# Patient Record
Sex: Male | Born: 1960 | Race: White | Hispanic: No | Marital: Married | Smoking: Current every day smoker
Health system: Southern US, Community
[De-identification: ages and names within clinical notes are randomized; demographics above are authoritative.]

## PROBLEM LIST (undated history)

## (undated) DIAGNOSIS — M545 Other chronic pain: Secondary | ICD-10-CM

## (undated) DIAGNOSIS — R011 Cardiac murmur, unspecified: Secondary | ICD-10-CM

## (undated) DIAGNOSIS — K219 Gastro-esophageal reflux disease without esophagitis: Secondary | ICD-10-CM

## (undated) DIAGNOSIS — S6990XA Unspecified injury of unspecified wrist, hand and finger(s), initial encounter: Secondary | ICD-10-CM

## (undated) DIAGNOSIS — M5136 Other intervertebral disc degeneration, lumbar region: Secondary | ICD-10-CM

## (undated) DIAGNOSIS — M51369 Other intervertebral disc degeneration, lumbar region without mention of lumbar back pain or lower extremity pain: Secondary | ICD-10-CM

## (undated) DIAGNOSIS — N529 Male erectile dysfunction, unspecified: Secondary | ICD-10-CM

## (undated) DIAGNOSIS — F419 Anxiety disorder, unspecified: Secondary | ICD-10-CM

## (undated) DIAGNOSIS — I4510 Unspecified right bundle-branch block: Secondary | ICD-10-CM

## (undated) DIAGNOSIS — K5730 Diverticulosis of large intestine without perforation or abscess without bleeding: Secondary | ICD-10-CM

## (undated) DIAGNOSIS — G8929 Other chronic pain: Secondary | ICD-10-CM

## (undated) DIAGNOSIS — Z87442 Personal history of urinary calculi: Secondary | ICD-10-CM

## (undated) DIAGNOSIS — I10 Essential (primary) hypertension: Secondary | ICD-10-CM

## (undated) DIAGNOSIS — C61 Malignant neoplasm of prostate: Secondary | ICD-10-CM

## (undated) DIAGNOSIS — Z973 Presence of spectacles and contact lenses: Secondary | ICD-10-CM

## (undated) DIAGNOSIS — N200 Calculus of kidney: Secondary | ICD-10-CM

## (undated) DIAGNOSIS — T7840XA Allergy, unspecified, initial encounter: Secondary | ICD-10-CM

## (undated) DIAGNOSIS — E785 Hyperlipidemia, unspecified: Secondary | ICD-10-CM

## (undated) HISTORY — DX: Essential (primary) hypertension: I10

## (undated) HISTORY — DX: Allergy, unspecified, initial encounter: T78.40XA

## (undated) HISTORY — DX: Gastro-esophageal reflux disease without esophagitis: K21.9

## (undated) HISTORY — DX: Cardiac murmur, unspecified: R01.1

## (undated) HISTORY — PX: POLYPECTOMY: SHX149

## (undated) HISTORY — DX: Calculus of kidney: N20.0

## (undated) HISTORY — PX: COLONOSCOPY: SHX174

## (undated) HISTORY — DX: Male erectile dysfunction, unspecified: N52.9

## (undated) HISTORY — DX: Unspecified injury of unspecified wrist, hand and finger(s), initial encounter: S69.90XA

## (undated) HISTORY — PX: UPPER GASTROINTESTINAL ENDOSCOPY: SHX188

## (undated) HISTORY — DX: Anxiety disorder, unspecified: F41.9

---

## 2005-06-29 ENCOUNTER — Ambulatory Visit: Payer: Self-pay | Admitting: Internal Medicine

## 2005-08-11 ENCOUNTER — Ambulatory Visit: Payer: Self-pay | Admitting: Internal Medicine

## 2005-10-23 ENCOUNTER — Ambulatory Visit: Payer: Self-pay | Admitting: Internal Medicine

## 2006-09-14 ENCOUNTER — Ambulatory Visit: Payer: Self-pay | Admitting: Internal Medicine

## 2007-04-28 ENCOUNTER — Ambulatory Visit: Payer: Self-pay | Admitting: Internal Medicine

## 2007-04-28 LAB — CONVERTED CEMR LAB
Albumin: 4.5 g/dL (ref 3.5–5.2)
Basophils Absolute: 0.1 10*3/uL (ref 0.0–0.1)
Basophils Relative: 0.9 % (ref 0.0–1.0)
Bilirubin Urine: NEGATIVE
Chloride: 103 meq/L (ref 96–112)
Cholesterol: 240 mg/dL (ref 0–200)
Creatinine, Ser: 0.6 mg/dL (ref 0.4–1.5)
Direct LDL: 131.4 mg/dL
Eosinophils Absolute: 0.3 10*3/uL (ref 0.0–0.6)
Eosinophils Relative: 3.7 % (ref 0.0–5.0)
GFR calc Af Amer: 187 mL/min
GFR calc non Af Amer: 155 mL/min
HCT: 44.1 % (ref 39.0–52.0)
HDL: 31 mg/dL — ABNORMAL LOW (ref >39.0)
Hemoglobin, Urine: NEGATIVE
Ketones, ur: NEGATIVE mg/dL
Monocytes Relative: 6 % (ref 3.0–11.0)
Neutrophils Relative %: 56.1 % (ref 43.0–77.0)
PSA: 1.76 ng/mL (ref 0.10–4.00)
Platelets: 220 10*3/uL (ref 150–400)
RBC: 4.66 M/uL (ref 4.22–5.81)
RDW: 12 % (ref 11.5–14.6)
Sodium: 139 meq/L (ref 135–145)
Specific Gravity, Urine: >=1.03 (ref 1.000–1.03)
TSH: 1.57 microintl units/mL (ref 0.35–5.50)
Total Bilirubin: 0.7 mg/dL (ref 0.3–1.2)
Total CHOL/HDL Ratio: 7.7
Triglycerides: 402 mg/dL (ref 0–149)
VLDL: 80 mg/dL — ABNORMAL HIGH (ref 0–40)
pH: 6 (ref 5.0–8.0)

## 2007-06-30 ENCOUNTER — Ambulatory Visit: Payer: Self-pay | Admitting: Internal Medicine

## 2007-06-30 LAB — CONVERTED CEMR LAB
Alkaline Phosphatase: 79 units/L (ref 39–117)
Direct LDL: 92 mg/dL
HDL: 31.9 mg/dL — ABNORMAL LOW (ref >39.0)
Total Bilirubin: 1 mg/dL (ref 0.3–1.2)
Total CHOL/HDL Ratio: 5.3
VLDL: 52 mg/dL — ABNORMAL HIGH (ref 0–40)

## 2008-01-16 ENCOUNTER — Ambulatory Visit: Payer: Self-pay | Admitting: Internal Medicine

## 2008-01-16 DIAGNOSIS — J309 Allergic rhinitis, unspecified: Secondary | ICD-10-CM | POA: Insufficient documentation

## 2008-01-16 DIAGNOSIS — R079 Chest pain, unspecified: Secondary | ICD-10-CM | POA: Insufficient documentation

## 2008-01-16 DIAGNOSIS — M545 Low back pain, unspecified: Secondary | ICD-10-CM | POA: Insufficient documentation

## 2008-01-16 DIAGNOSIS — E785 Hyperlipidemia, unspecified: Secondary | ICD-10-CM | POA: Insufficient documentation

## 2008-01-16 DIAGNOSIS — Z720 Tobacco use: Secondary | ICD-10-CM | POA: Insufficient documentation

## 2008-01-16 DIAGNOSIS — F411 Generalized anxiety disorder: Secondary | ICD-10-CM | POA: Insufficient documentation

## 2008-01-16 DIAGNOSIS — K219 Gastro-esophageal reflux disease without esophagitis: Secondary | ICD-10-CM | POA: Insufficient documentation

## 2008-01-16 DIAGNOSIS — F528 Other sexual dysfunction not due to a substance or known physiological condition: Secondary | ICD-10-CM | POA: Insufficient documentation

## 2008-01-16 DIAGNOSIS — F17200 Nicotine dependence, unspecified, uncomplicated: Secondary | ICD-10-CM

## 2008-05-01 ENCOUNTER — Telehealth (INDEPENDENT_AMBULATORY_CARE_PROVIDER_SITE_OTHER): Payer: Self-pay | Admitting: *Deleted

## 2008-05-24 ENCOUNTER — Ambulatory Visit: Payer: Self-pay | Admitting: Internal Medicine

## 2008-05-29 LAB — CONVERTED CEMR LAB
ALT: 50 units/L (ref 0–53)
Albumin: 4.1 g/dL (ref 3.5–5.2)
BUN: 11 mg/dL (ref 6–23)
Bilirubin Urine: NEGATIVE
Chloride: 102 meq/L (ref 96–112)
Cholesterol: 210 mg/dL (ref 0–200)
Glucose, Bld: 125 mg/dL — ABNORMAL HIGH (ref 70–99)
HCT: 41.8 % (ref 39.0–52.0)
HDL: 29.7 mg/dL — ABNORMAL LOW (ref >39.0)
Ketones, ur: NEGATIVE mg/dL
Leukocytes, UA: NEGATIVE
Lymphocytes Relative: 37.2 % (ref 12.0–46.0)
MCHC: 35.3 g/dL (ref 30.0–36.0)
MCV: 95.1 fL (ref 78.0–100.0)
Neutrophils Relative %: 51.6 % (ref 43.0–77.0)
Nitrite: NEGATIVE
PSA: 1.83 ng/mL (ref 0.10–4.00)
Potassium: 3.9 meq/L (ref 3.5–5.1)
TSH: 0.94 microintl units/mL (ref 0.35–5.50)
Total CHOL/HDL Ratio: 7.1
Total Protein, Urine: NEGATIVE mg/dL
Total Protein: 6.7 g/dL (ref 6.0–8.3)
Triglycerides: 281 mg/dL (ref 0–149)
pH: 5.5 (ref 5.0–8.0)

## 2008-05-30 ENCOUNTER — Telehealth (INDEPENDENT_AMBULATORY_CARE_PROVIDER_SITE_OTHER): Payer: Self-pay | Admitting: *Deleted

## 2009-03-11 ENCOUNTER — Ambulatory Visit: Payer: Self-pay | Admitting: Internal Medicine

## 2009-03-11 DIAGNOSIS — R1013 Epigastric pain: Secondary | ICD-10-CM | POA: Insufficient documentation

## 2009-03-11 DIAGNOSIS — E739 Lactose intolerance, unspecified: Secondary | ICD-10-CM | POA: Insufficient documentation

## 2009-03-13 LAB — CONVERTED CEMR LAB
Basophils Absolute: 0.1 10*3/uL (ref 0.0–0.1)
Calcium: 9 mg/dL (ref 8.4–10.5)
Chloride: 104 meq/L (ref 96–112)
Cholesterol: 198 mg/dL (ref 0–200)
Direct LDL: 121.6 mg/dL
Eosinophils Absolute: 0.5 10*3/uL (ref 0.0–0.7)
Eosinophils Relative: 6.3 % — ABNORMAL HIGH (ref 0.0–5.0)
HCT: 43.4 % (ref 39.0–52.0)
HDL: 31.5 mg/dL — ABNORMAL LOW (ref >39.00)
Hemoglobin: 15.1 g/dL (ref 13.0–17.0)
Hgb A1c MFr Bld: 5.4 % (ref 4.6–6.5)
MCHC: 34.9 g/dL (ref 30.0–36.0)
Monocytes Absolute: 0.4 10*3/uL (ref 0.1–1.0)
Platelets: 186 10*3/uL (ref 150.0–400.0)
Potassium: 4.3 meq/L (ref 3.5–5.1)
Sodium: 138 meq/L (ref 135–145)
Total Bilirubin: 0.4 mg/dL (ref 0.3–1.2)
Total CHOL/HDL Ratio: 6
Triglycerides: 321 mg/dL — ABNORMAL HIGH (ref 0.0–149.0)

## 2009-04-24 ENCOUNTER — Ambulatory Visit: Payer: Self-pay | Admitting: Internal Medicine

## 2009-04-24 LAB — CONVERTED CEMR LAB
ALT: 47 units/L (ref 0–53)
AST: 36 units/L (ref 0–37)
Albumin: 4.2 g/dL (ref 3.5–5.2)
Alkaline Phosphatase: 64 units/L (ref 39–117)
Bilirubin, Direct: 0.2 mg/dL (ref 0.0–0.3)
Cholesterol: 193 mg/dL (ref 0–200)
Total CHOL/HDL Ratio: 6
Triglycerides: 372 mg/dL — ABNORMAL HIGH (ref 0.0–149.0)

## 2009-05-06 ENCOUNTER — Telehealth: Payer: Self-pay | Admitting: Internal Medicine

## 2009-05-28 ENCOUNTER — Telehealth (INDEPENDENT_AMBULATORY_CARE_PROVIDER_SITE_OTHER): Payer: Self-pay | Admitting: *Deleted

## 2009-10-29 ENCOUNTER — Telehealth: Payer: Self-pay | Admitting: Internal Medicine

## 2009-11-05 ENCOUNTER — Telehealth: Payer: Self-pay | Admitting: Internal Medicine

## 2010-01-16 ENCOUNTER — Ambulatory Visit: Payer: Self-pay | Admitting: Internal Medicine

## 2010-01-16 ENCOUNTER — Encounter: Payer: Self-pay | Admitting: Internal Medicine

## 2010-01-16 DIAGNOSIS — R0602 Shortness of breath: Secondary | ICD-10-CM | POA: Insufficient documentation

## 2010-01-16 LAB — CONVERTED CEMR LAB
AST: 32 units/L (ref 0–37)
Basophils Absolute: 0.1 10*3/uL (ref 0.0–0.1)
Basophils Relative: 1.3 % (ref 0.0–3.0)
Bilirubin Urine: NEGATIVE
CO2: 30 meq/L (ref 19–32)
Creatinine, Ser: 0.6 mg/dL (ref 0.4–1.5)
Eosinophils Relative: 5.7 % — ABNORMAL HIGH (ref 0.0–5.0)
GFR calc non Af Amer: 152.58 mL/min (ref >60)
Glucose, Bld: 88 mg/dL (ref 70–99)
HDL: 43.8 mg/dL (ref >39.00)
Hemoglobin, Urine: NEGATIVE
Hemoglobin: 15.1 g/dL (ref 13.0–17.0)
LDL Cholesterol: 96 mg/dL (ref 0–99)
Leukocytes, UA: NEGATIVE
Lymphocytes Relative: 23.7 % (ref 12.0–46.0)
MCHC: 33.5 g/dL (ref 30.0–36.0)
MCV: 97.7 fL (ref 78.0–100.0)
Monocytes Relative: 5.6 % (ref 3.0–12.0)
Neutro Abs: 5.4 10*3/uL (ref 1.4–7.7)
Nitrite: NEGATIVE
RDW: 12.1 % (ref 11.5–14.6)
TSH: 1.17 microintl units/mL (ref 0.35–5.50)
Total Bilirubin: 0.4 mg/dL (ref 0.3–1.2)
Total CHOL/HDL Ratio: 4
Urobilinogen, UA: 1 (ref 0.0–1.0)
VLDL: 31.2 mg/dL (ref 0.0–40.0)

## 2010-01-23 ENCOUNTER — Ambulatory Visit: Payer: Self-pay | Admitting: Internal Medicine

## 2010-01-29 ENCOUNTER — Encounter: Payer: Self-pay | Admitting: Internal Medicine

## 2010-06-24 ENCOUNTER — Telehealth: Payer: Self-pay | Admitting: Internal Medicine

## 2010-06-26 ENCOUNTER — Encounter (INDEPENDENT_AMBULATORY_CARE_PROVIDER_SITE_OTHER): Payer: Self-pay | Admitting: *Deleted

## 2010-06-26 ENCOUNTER — Ambulatory Visit: Payer: Self-pay | Admitting: Internal Medicine

## 2010-06-26 DIAGNOSIS — R634 Abnormal weight loss: Secondary | ICD-10-CM | POA: Insufficient documentation

## 2010-07-03 ENCOUNTER — Telehealth: Payer: Self-pay | Admitting: Internal Medicine

## 2010-08-05 ENCOUNTER — Ambulatory Visit: Payer: Self-pay | Admitting: Internal Medicine

## 2010-08-05 HISTORY — PX: ESOPHAGOGASTRODUODENOSCOPY: SHX1529

## 2010-11-12 ENCOUNTER — Telehealth: Payer: Self-pay | Admitting: Internal Medicine

## 2010-11-19 ENCOUNTER — Ambulatory Visit: Admit: 2010-11-19 | Payer: Self-pay | Admitting: Internal Medicine

## 2010-12-12 ENCOUNTER — Ambulatory Visit: Admit: 2010-12-12 | Payer: Self-pay | Admitting: Internal Medicine

## 2010-12-24 ENCOUNTER — Ambulatory Visit: Admit: 2010-12-24 | Payer: Self-pay | Admitting: Internal Medicine

## 2010-12-24 ENCOUNTER — Other Ambulatory Visit: Payer: Self-pay

## 2010-12-24 NOTE — Letter (Signed)
Summary: EGD Instructions  [facility]  [address] [place], [place] [address]   Phone: [phone]  Fax: [phone]       Antone Payano    [birth year]    MRN: [id number]       Procedure Day Dorna Bloom: Jake Shark, 08/05/10     Arrival Time: 12:30 PM     Procedure Time: 1:30 PM     Location of Procedure:                    _X_ [facility] (4th Floor)  PREPARATION FOR ENDOSCOPY   On TUESDAY, 08/05/10,  THE DAY OF THE PROCEDURE:  1.   No solid foods, milk or milk products are allowed after midnight the night before your procedure.  2.   Do not drink anything colored red or purple.  Avoid juices with pulp.  No orange juice.  3.  You may drink clear liquids until 11:30AM, which is 2 hours before your procedure.                                                                                                CLEAR LIQUIDS INCLUDE: Water Jello Ice Popsicles Tea (sugar ok, no milk/cream) Powdered fruit flavored drinks Coffee (sugar ok, no milk/cream) Gatorade Juice: apple, white grape, white cranberry  Lemonade Clear bullion, consomm, broth Carbonated beverages (any kind) Strained chicken noodle soup Hard Candy   MEDICATION INSTRUCTIONS  Unless otherwise instructed, you should take regular prescription medications with a small sip of water as early as possible the morning of your procedure.                  OTHER INSTRUCTIONS  You will need a responsible adult at least 50 years of age to accompany you and drive you home.   This person must remain in the waiting room during your procedure.  Wear loose fitting clothing that is easily removed.  Leave jewelry and other valuables at home.  However, you may wish to bring a book to read or an iPod/MP3 player to listen to music as you wait for your procedure to start.  Remove all body piercing jewelry and leave at home.  Total time from sign-in until discharge is approximately 2-3 hours.  You  should go home directly after your procedure and rest.  You can resume normal activities the day after your procedure.  The day of your procedure you should not:   Drive   Make legal decisions   Operate machinery   Drink alcohol   Return to work  You will receive specific instructions about eating, activities and medications before you leave.    The above instructions have been reviewed and explained to me by   _______________________    I fully understand and can verbalize these instructions _____________________________ Date _________

## 2010-12-24 NOTE — Progress Notes (Signed)
Summary: triage  Phone Note Call from Patient Call back at Home Phone [phone]   Caller: Patient Call For: Dr. Leone Payor Reason for Call: Talk to Nurse Details for Reason: triage Summary of Call: new pt appt scheduled 9/26 - having problems and would like to be seen sooner...please call. Initial call taken by: Schuyler Amor,  June 24, 2010 3:08 PM  Follow-up for Phone Call        rescheduled for 06/26/10 Follow-up by: Darcey Nora RN, CGRN,  June 24, 2010 3:19 PM

## 2010-12-24 NOTE — Progress Notes (Signed)
Summary: REQ FOR RX  Phone Note Call from Patient   Summary of Call: Patient is requesting rx for pravachol. He currently is taking 80mg  once daily. He would like rx for 40mg  2 daily to go to [facility].  Initial call taken by: Lamar Sprinkles, CMA,  July 03, 2010 3:24 PM  Follow-up for Phone Call        ok  for rx - to [facility] to handle  due for CPX with labs feb 2012 please Follow-up by: Corwin Levins MD,  July 03, 2010 3:39 PM    New/Updated Medications: PRAVACHOL 40 MG TABS (PRAVASTATIN SODIUM) 2 by mouth once daily Prescriptions: PRAVACHOL 40 MG TABS (PRAVASTATIN SODIUM) 2 by mouth once daily  #60 x 5   Entered by:   Scharlene Gloss CMA (AAMA)   Authorized by:   Corwin Levins MD   Signed by:   Scharlene Gloss CMA (AAMA) on 07/03/2010   Method used:   Faxed to ...       [facility] [address] (retail)       [address]       [place], [place]  [address]       Ph: [phone]       Fax: [phone]   RxID:   [id number]   Appended Document: REQ FOR RX Called pt informed prescription sent in and patient did schedule CPX December 31, 2010.

## 2010-12-24 NOTE — Miscellaneous (Signed)
Summary: Orders Update  Clinical Lists Changes  Orders: Added new Referral order of Misc. Referral (Misc. Ref) - Signed 

## 2010-12-24 NOTE — Assessment & Plan Note (Signed)
Summary: abd pain...as.   History of Present Illness Visit Type: consult  Primary GI MD: Stan Head MD [facility] Primary Provider: Oliver Barre, MD  Requesting Provider: Oliver Barre, MD  Chief Complaint: Loss of appetite  History of Present Illness:   50 yo wm with 6 months of epigastric burning. Starting to have anorexia. Some sharp stinging and very transient pain that occurs on top of this burning. Does not disturb his sleep. He takes Prilosec daily which controls heartburn mostly.  5 # unintentional weight decrease since May. He tried Maalox recently but developed diarrhea. Denies early satiety but limits eating due to fear pain will worsen. Looking back he has had similar sxs since at least 02/2009, though the heartburn sxs seem improved with PPI.   GI Review of Systems    Reports abdominal pain and  loss of appetite.     Location of  Abdominal pain: upper abdomen.    Denies acid reflux, belching, bloating, chest pain, dysphagia with liquids, dysphagia with solids, heartburn, nausea, vomiting, vomiting blood, weight loss, and  weight gain.        Denies anal fissure, black tarry stools, change in bowel habit, constipation, diarrhea, diverticulosis, fecal incontinence, heme positive stool, hemorrhoids, irritable bowel syndrome, jaundice, light color stool, liver problems, rectal bleeding, and  rectal pain.    Current Medications (verified): 1)  Tramadol Hcl 50 Mg Tabs (Tramadol Hcl) .... 1/2 By Mouth Two Times A Day As Needed 2)  Ecotrin Low Strength 81 Mg  Tbec (Aspirin) .Marland Kitchen.. 1po Qd 3)  Omeprazole 20 Mg Cpdr (Omeprazole) .Marland Kitchen.. 1 By Mouth Once Daily  Allergies (verified): No Known Drug Allergies  Past History:  Past Medical History: Reviewed history from 03/11/2009 and no changes required. Hyperlipidemia Low back pain - chronic lumbar disc disease Anxiety E.D. Allergic rhinitis GERD glucose intolerance  Past Surgical History: Reviewed history from 01/16/2008 and no changes  required. Tonsillectomy  Family History: father with prostate cancer No FH of Colon Cancer: Family History of Heart Disease: Both sides of the family   Social History: Self Employed printing and sells flooring Married no biological children Patient is a former smoker.  Alcohol Use - yes: 3-6 daily  Illicit Drug Use - no Smoking Status:  quit Drug Use:  no  Review of Systems       The patient complains of back pain.         All other ROS negative except as per HPI.   Vital Signs:  Patient profile:   50 year old male Height:      71 inches Weight:      157 pounds BMI:     21.98 BSA:     1.90 Pulse rate:   88 / minute Pulse rhythm:   regular BP sitting:   122 / 74  (left arm) Cuff size:   regular  Vitals Entered By: Ok Anis CMA (June 26, 2010 3:42 PM)  Physical Exam  General:  Well developed, well nourished, no acute distress. Eyes:  no icterus. Mouth:  No deformity or lesions, dentition normal. Neck:  Supple; no masses or thyromegaly. Lungs:  Clear throughout to auscultation. Heart:  Regular rate and rhythm; no murmurs, rubs,  or bruits. Abdomen:  Soft, nontender and nondistended. No masses, hepatosplenomegaly or hernias noted. Normal bowel sounds. Extremities:  No clubbing, cyanosis, edema or deformities noted. Neurologic:  Alert and  oriented x4;  grossly normal neurologically. Cervical Nodes:  No significant cervical or supraclavicular adenopathy.  Psych:  Alert and cooperative. Normal mood and affect.   Impression & Recommendations:  Problem # 1:  ABDOMINAL PAIN, EPIGASTRIC (ICD-789.06) Assessment Comment Only New to me but noted since 02/2009 chronic, mild weight loss at 157# and was in low 160's then 170 about 15 mos ago cause unclear at this time, part of symptom complex (pyrosis, GERD sxs) much better with PPI (omeprazole) ? gastritis, functional, neoplasm possible but much less likely given chronicity but does have some weight loss  will  change omeprazole to Dexilant 60 mg daily (samples) to see if more effective also  Risks, benefits,and indications of endoscopic procedure(s) were reviewed with the patient and all questions answered. dexilant samples  Orders: EGD (EGD)  Problem # 2:  WEIGHT LOSS (ICD-783.21) Assessment: New mild 5# unintentional Orders: EGD (EGD)  Problem # 3:  SCREENING, COLON CANCER (ICD-V76.51) Assessment: Comment Only will place a colonoscopy recall for 08/2011 (when he is 50)  Patient Instructions: 1)  Dexilant samples given.  Call for a prescription if this helps. 2)  We will see you at your procedure on 08/05/10. 3)  [facility] Patient Information Guide given to patient.  4)  Upper Endoscopy brochure given.  5)  Copy sent to : Oliver Barre, MD 6)  The medication list was reviewed and reconciled.  All changed / newly prescribed medications were explained.  A complete medication list was provided to the patient / caregiver.

## 2010-12-24 NOTE — Miscellaneous (Signed)
Summary: Orders Update pft charges  Clinical Lists Changes  Orders: Added new Service order of Carbon Monoxide diffusing w/capacity (94720) - Signed Added new Service order of Lung Volumes (94240) - Signed Added new Service order of Spirometry (Pre & Post) (94060) - Signed 

## 2010-12-24 NOTE — Procedures (Signed)
Summary: Upper Endoscopy  Patient: Richard Santos Note: All result statuses are Final unless otherwise noted.  Tests: (1) Upper Endoscopy (EGD)   EGD Upper Endoscopy       DONE     [facility]     [address]     [place], [place]  [address]           ENDOSCOPY PROCEDURE REPORT           PATIENT:  Richard Santos  MR#:  [id number]     BIRTHDATE:  06-27-1961, 48 yrs. old  GENDER:  male           ENDOSCOPIST:  Iva Boop, MD, [facility]     Referred by:  Oliver Barre, M.D.           PROCEDURE DATE:  08/05/2010     PROCEDURE:  EGD with biopsy     ASA CLASS:  Class I     INDICATIONS:  epigastric pain -  burning epigastric pain x several     months or more     PPI controls heartburn but not the pain     On Prilosec, samples of Dexilant caused diarrhea           MEDICATIONS:   Fentanyl 50 mcg, Versed 5 mg IV     TOPICAL ANESTHETIC:  Exactacain Spray           DESCRIPTION OF PROCEDURE:   After the risks benefits and     alternatives of the procedure were thoroughly explained, informed     consent was obtained.  The LB [id number]-[id number] endoscope was     introduced through the mouth and advanced to the second portion of     the duodenum, without limitations.  The instrument was slowly     withdrawn as the mucosa was fully examined.     <<PROCEDUREIMAGES>>           There were columnar-type mucosal changes in the distal esophagus,     that could represent Barrett's esophagus in the distal esophagus.     Three 0.5-1cm tongues 40-41 cm. Nothing suspicious seen on white     light or NBI. Multiple biopsies were obtained and sent to     pathology.  Mild gastritis was found in the antrum. It was     erythematous and mottled. Multiple biopsies were obtained and sent     to pathology.  Otherwise the examination was normal.     Retroflexed views revealed no abnormalities.    The scope was then     withdrawn from the patient and the procedure completed.           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           REPEAT EXAM:  as needed/pending biopsies           Iva Boop, MD, [facility]           CC:  Corwin Levins, MD     The Patient           n.     eSIGNED:  Iva Boop at 08/05/2010 01:54 PM           Domingo Pulse, [id number]  Note: An exclamation mark (!) indicates a result that was not dispersed into the flowsheet. Document Creation Date: 08/05/2010 1:55 PM _______________________________________________________________________  (1) Order result status: Final Collection or observation date-time: 08/05/2010 13:43 Requested date-time:  Receipt date-time:  Reported date-time:  Referring Physician:   Ordering Physician: Stan Head [id number]) Specimen Source:  Source: Launa Grill Order Number: [id number] Lab site:

## 2010-12-24 NOTE — Assessment & Plan Note (Signed)
Summary: PER WIFE END OF WK APPT-STOMACH/BURNING--STC   Vital Signs:  Patient profile:   50 year old male Height:      71 inches Weight:      160.50 pounds BMI:     22.47 O2 Sat:      98 % on Room air Temp:     98.1 degrees F oral Pulse rate:   86 / minute BP sitting:   120 / 80  (left arm) Cuff size:   regular  Vitals Entered ByZella Ball Ewing (January 16, 2010 10:38 AM)  O2 Flow:  Room air  Preventive Care Screening     declines tetanus today  CC: Stomach pain, wellness/RE   CC:  Stomach pain and wellness/RE.  History of Present Illness: here with constant midl to mod burning pain to the upper mid abd and with some radiation to the left upper abd, has flares up to 10 min with significant worsening pain level to mod level several times per day for the last 2 to 3 wks;  only some improved with tramadol and only takes 1/2 two times a day as he is somewhat wary of potential side effect though he has not had side effect so far;  overall pain worse to walking up inclines or hills - worse in the am, but later in the day seems to improve to mild in the afternoon and evening.  No n/v, dysphagia, wt loss, fever, other abd pain, distension, change in bowel habit or blood loss.  Denies nsaid use or ETOH increase.  Overall good complaicne with meds, tolerating well.  Nothing seems to make the pain better.  Has been taking the PPI infrequently recently as reflux has been minor and gettnig by with as needed otc atnacid and diet control measures.    Problems Prior to Update: 1)  Dyspnea  (ICD-786.05) 2)  Hepatotoxicity, Drug-induced, Risk of  (ICD-V58.69) 3)  Abdominal Pain, Epigastric  (ICD-789.06) 4)  Glucose Intolerance  (ICD-271.3) 5)  Preventive Health Care  (ICD-V70.0) 6)  Gerd  (ICD-530.81) 7)  Allergic Rhinitis  (ICD-477.9) 8)  Anxiety  (ICD-300.00) 9)  Low Back Pain  (ICD-724.2) 10)  Erectile Dysfunction  (ICD-302.72) 11)  Smoker  (ICD-305.1) 12)  Hyperlipidemia  (ICD-272.4) 13)   Chest Pain  (ICD-786.50)  Medications Prior to Update: 1)  Tramadol Hcl 50 Mg Tabs (Tramadol Hcl) .Marland Kitchen.. 1po Four Times Per Day As Needed For Pain 2)  Ecotrin Low Strength 81 Mg  Tbec (Aspirin) .Marland Kitchen.. 1po Qd 3)  Cialis 20 Mg  Tabs (Tadalafil) .Marland Kitchen.. 1 By Mouth Once Daily Prn 4)  Lansoprazole 30 Mg Cpdr (Lansoprazole) .Marland Kitchen.. 1 By Mouth Once Daily 5)  Pravachol 80 Mg Tabs (Pravastatin Sodium) .Marland Kitchen.. 1 By Mouth Once Daily  Current Medications (verified): 1)  Tramadol Hcl 50 Mg Tabs (Tramadol Hcl) .... 1/2 By Mouth Two Times A Day As Needed 2)  Ecotrin Low Strength 81 Mg  Tbec (Aspirin) .Marland Kitchen.. 1po Qd 3)  Cialis 20 Mg  Tabs (Tadalafil) .Marland Kitchen.. 1 By Mouth Once Daily Prn 4)  Omeprazole 20 Mg Cpdr (Omeprazole) .... 2po Once Daily  Allergies (verified): No Known Drug Allergies  Past History:  Past Medical History: Last updated: 03/11/2009 Hyperlipidemia Low back pain - chronic lumbar disc disease Anxiety E.D. Allergic rhinitis GERD glucose intolerance  Past Surgical History: Last updated: 01/16/2008 Tonsillectomy  Family History: Last updated: 01/16/2008 father with prostate cancer  Social History: Last updated: 03/11/2009 Current Smoker Alcohol use-yes Married no biological children work -  press operator - currently unemployed  Risk Factors: Smoking Status: current (01/16/2008)  Review of Systems  The patient denies anorexia, fever, weight loss, vision loss, decreased hearing, hoarseness, chest pain, syncope, dyspnea on exertion, peripheral edema, prolonged cough, headaches, hemoptysis, melena, hematochezia, severe indigestion/heartburn, hematuria, suspicious skin lesions, difficulty walking, depression, unusual weight change, abnormal bleeding, enlarged lymph nodes, and angioedema.         all otherwise negative per pt - except for mild sob at rest randomly without exertion, ? anxiety related, no ST, fever, cough, wheezing  Physical Exam  General:  alert and well-developed.     Head:  normocephalic and atraumatic.   Eyes:  vision grossly intact, pupils equal, and pupils round.   Ears:  R ear normal and L ear normal.   Nose:  no external deformity and no nasal discharge.   Mouth:  no gingival abnormalities and pharynx pink and moist.   Neck:  supple and no masses.   Lungs:  normal respiratory effort and normal breath sounds.   Heart:  normal rate and regular rhythm.   Abdomen:  soft and normal bowel sounds.  , mild epigastric tender without guarding , rebound or distension Msk:  no joint tenderness and no joint swelling.   Extremities:  no edema, no erythema  Neurologic:  cranial nerves II-XII intact and strength normal in all extremities.     Impression & Recommendations:  Problem # 1:  Preventive Health Care (ICD-V70.0)  Overall doing well, age appropriate education and counseling updated and referral for appropriate preventive services done unless declined, immunizations up to date or declined, diet counseling done if overweight, urged to quit smoking if smokes , most recent labs reviewed and current ordered if appropriate, ecg reviewed or declined (interpretation per ECG scanned in the EMR if done); information regarding Medicare Prevention requirements given if appropriate   Orders: TLB-TSH (Thyroid Stimulating Hormone) (84443-TSH) TLB-PSA (Prostate Specific Antigen) (84153-PSA) TLB-Lipid Panel (80061-LIPID) TLB-Udip ONLY (81003-UDIP) TLB-Hepatic/Liver Function Pnl (80076-HEPATIC) TLB-CBC Platelet - w/Differential (85025-CBCD) TLB-BMP (Basic Metabolic Panel-BMET) (80048-METABOL)  Problem # 2:  ABDOMINAL PAIN, EPIGASTRIC (ICD-789.06)  to increase the ppi to 2 once daily, stop the statin for now;  refer GI, check lipase; likely needs EGD  Orders: Gastroenterology Referral (GI) TLB-Lipase (83690-LIPASE)  Problem # 3:  DYSPNEA (ICD-786.05)  Orders: T-2 View CXR, Same Day (71020.5TC) unclear etiology, to check labs as above, check cxr,  declines  echo, ecg for now, trying to avoid cost  Complete Medication List: 1)  Tramadol Hcl 50 Mg Tabs (Tramadol hcl) .... 1/2 by mouth two times a day as needed 2)  Ecotrin Low Strength 81 Mg Tbec (Aspirin) .Marland Kitchen.. 1po qd 3)  Cialis 20 Mg Tabs (Tadalafil) .Marland Kitchen.. 1 by mouth once daily prn 4)  Omeprazole 20 Mg Cpdr (Omeprazole) .... 2po once daily  Patient Instructions: 1)  increase the omeprazole 20 mg to 2 pills per day 2)  stop the pravachol for now 3)  Please go to the Lab in the basement for your blood and/or urine tests today 4)  Please go to Radiology in the basement level for your X-Ray today  5)  Continue all previous medications as before this visit 6)  You will be contacted about the referral(s) to: Gastroenterology 7)  Please schedule a follow-up appointment in 1 year or sooner if needed Prescriptions: OMEPRAZOLE 20 MG CPDR (OMEPRAZOLE) 2po once daily  #180 x 3   Entered and Authorized by:   Corwin Levins MD  Signed by:   Corwin Levins MD on 01/16/2010   Method used:   Electronically to        [facility] [address] (retail)       [address]       [place], [place]  [address]       Ph: [phone]       Fax: [phone]   RxID:   [id number]

## 2010-12-25 NOTE — Progress Notes (Signed)
Summary: Triage  Phone Note Call from Patient Call back at Home Phone [phone]   Caller: Patient Call For: Dr. Leone Payor Reason for Call: Talk to Nurse Summary of Call: abd burning, cannot eat regularly due to the burning Initial call taken by: Karna Christmas,  November 12, 2010 4:06 PM  Follow-up for Phone Call        Patient  c/o upper "stomach burning".  Worse if he eats too much.  He currently is taking pantoprazole once daily, but this is not helping.  I have asked him to increase his pantoprazole to two times a day until Dr. Leone Payor returns next week.  Dr Leone Payor per your pathology report in Sept says if continued pain will change meds.  Dr Leone Payor he is aware you are out of the office until Monday.  Please advise Follow-up by: Darcey Nora RN, CGRN,  November 12, 2010 4:57 PM  Additional Follow-up for Phone Call Additional follow up Details #1::        1) How much does he Richard Santos now? 2) is he still drinking 3-6 alcoholic beverages/day? - this is too much. 3) go ahead and set up an REV also (in January sometime)  I will make recommendations after we find this out Additional Follow-up by: Iva Boop MD, [facility],  November 15, 2010 7:25 AM    Additional Follow-up for Phone Call Additional follow up Details #2::    Left message for patient to call back Darcey Nora RN, [facility]  November 18, 2010 10:28 AM  Lmom for patient to call back. Graciella Freer RN  November 19, 2010 10:20 AM    Patient reports the Pantoprazole Bid is still not helping. He reports he may have lost 5 lbs and his last weight was 153. He reports consuming 3-6 alcoholic beverages daily. Patient reports his appetite has really decreased and his pain is at the top of his stomach at his ribs " like a severe sunburn inside".  Per Dr Leone Payor, scheduled patient for a REV on 12/12/10 @  2:30pm. Graciella Freer RN  November 19, 2010 11:29 AM   Additional Follow-up for Phone Call Additional follow up Details #3::  Details for Additional Follow-up Action Taken: ok he needs to stop alcohol completely also have hinm do a CBC, CMET and amylase/lipase re: abdominal pain Additional Follow-up by: Iva Boop MD, [facility],  November 19, 2010 11:46 AM  Lmom for patient to call back. Ordered labs for patient per Dr Leone Payor. Graciella Freer RN  November 19, 2010 2:08 PM   Notified patient of his lab appointment and that he needs to stop drinking alcohol completely per Dr Leone Payor. Patient stated understanding. Graciella Freer RN  November 19, 2010 3:41 PM

## 2010-12-29 ENCOUNTER — Encounter (INDEPENDENT_AMBULATORY_CARE_PROVIDER_SITE_OTHER): Payer: Self-pay | Admitting: *Deleted

## 2010-12-29 ENCOUNTER — Other Ambulatory Visit: Payer: Self-pay | Admitting: Internal Medicine

## 2010-12-29 ENCOUNTER — Other Ambulatory Visit: Payer: BC Managed Care – PPO

## 2010-12-29 DIAGNOSIS — Z1289 Encounter for screening for malignant neoplasm of other sites: Secondary | ICD-10-CM

## 2010-12-29 DIAGNOSIS — Z0000 Encounter for general adult medical examination without abnormal findings: Secondary | ICD-10-CM

## 2010-12-29 DIAGNOSIS — E785 Hyperlipidemia, unspecified: Secondary | ICD-10-CM

## 2010-12-29 LAB — URINALYSIS
Leukocytes, UA: NEGATIVE
Nitrite: NEGATIVE
Specific Gravity, Urine: 1.015 (ref 1.000–1.030)
Urine Glucose: NEGATIVE
Urobilinogen, UA: 0.2 (ref 0.0–1.0)

## 2010-12-29 LAB — LDL CHOLESTEROL, DIRECT: Direct LDL: 156.3 mg/dL

## 2010-12-29 LAB — LIPID PANEL
Total CHOL/HDL Ratio: 5
VLDL: 29 mg/dL (ref 0.0–40.0)

## 2010-12-29 LAB — BASIC METABOLIC PANEL
BUN: 10 mg/dL (ref 6–23)
Chloride: 100 mEq/L (ref 96–112)
Potassium: 4.1 mEq/L (ref 3.5–5.1)

## 2010-12-29 LAB — CBC WITH DIFFERENTIAL/PLATELET
Basophils Relative: 0.5 % (ref 0.0–3.0)
Eosinophils Absolute: 0.4 10*3/uL (ref 0.0–0.7)
Eosinophils Relative: 4.8 % (ref 0.0–5.0)
Lymphocytes Relative: 31.4 % (ref 12.0–46.0)
Neutrophils Relative %: 58.5 % (ref 43.0–77.0)
RBC: 4.55 Mil/uL (ref 4.22–5.81)
WBC: 8.3 10*3/uL (ref 4.5–10.5)

## 2010-12-29 LAB — TSH: TSH: 0.97 u[IU]/mL (ref 0.35–5.50)

## 2010-12-29 LAB — HEPATIC FUNCTION PANEL
ALT: 47 U/L (ref 0–53)
AST: 39 U/L — ABNORMAL HIGH (ref 0–37)
Total Bilirubin: 0.6 mg/dL (ref 0.3–1.2)

## 2010-12-29 LAB — PSA: PSA: 3.97 ng/mL (ref 0.10–4.00)

## 2010-12-31 ENCOUNTER — Encounter: Payer: Self-pay | Admitting: Internal Medicine

## 2010-12-31 ENCOUNTER — Encounter (INDEPENDENT_AMBULATORY_CARE_PROVIDER_SITE_OTHER): Payer: BC Managed Care – PPO | Admitting: Internal Medicine

## 2010-12-31 DIAGNOSIS — K2990 Gastroduodenitis, unspecified, without bleeding: Secondary | ICD-10-CM | POA: Insufficient documentation

## 2010-12-31 DIAGNOSIS — F1010 Alcohol abuse, uncomplicated: Secondary | ICD-10-CM | POA: Insufficient documentation

## 2010-12-31 DIAGNOSIS — K2970 Gastritis, unspecified, without bleeding: Secondary | ICD-10-CM | POA: Insufficient documentation

## 2010-12-31 DIAGNOSIS — Z23 Encounter for immunization: Secondary | ICD-10-CM

## 2010-12-31 DIAGNOSIS — Z0000 Encounter for general adult medical examination without abnormal findings: Secondary | ICD-10-CM

## 2011-01-08 NOTE — Assessment & Plan Note (Signed)
Summary: CPX/BCBS/#/CD   Vital Signs:  Patient profile:   50 year old male Height:      72 inches Weight:      162.38 pounds BMI:     22.10 O2 Sat:      97 % on Room air Temp:     98.3 degrees F oral Pulse rate:   90 / minute BP sitting:   112 / 72  (left arm) Cuff size:   regular  Vitals Entered By: Zella Ball Ewing CMA Duncan Dull) (December 31, 2010 1:07 PM)  O2 Flow:  Room air  CC: Adult Physical/Re   Primary Care Provider:  Oliver Barre, MD   CC:  Adult Physical/Re.  History of Present Illness: here for wellness, still drinking over 3 beers per day despite being asked not to per GI, still with gastritis type symptoms despite Overall good compliance with meds, and good tolerability. Pt denies CP, worsening sob, doe, wheezing, orthopnea, pnd, worsening LE edema, palps, dizziness or syncope  Pt denies new neuro symptoms such as headache, facial or extremity weakness  Pt denies polydipsia, polyuria   Overall good compliance with meds, trying to follow low chol  diet, wt stable, little excercise however.  ran out of statin prior labs this time, but normally takes well.  Overall good compliance with meds, and good tolerability.  Denies worsening depressive symptoms, suicidal ideation, or panic.   No fever, wt loss, night sweats, loss of appetite or other constitutional symptoms  Pt states good ability with ADL's, low fall risk, home safety reviewed and adequate, no significant change in hearing or vision, trying to follow lower chol diet, and occasionally active only with regular excercise.   Problems Prior to Update: 1)  Gastritis  (ICD-535.50) 2)  Ethanol Abuse  (ICD-305.00) 3)  Preventive Health Care  (ICD-V70.0) 4)  Weight Loss  (ICD-783.21) 5)  Dyspnea  (ICD-786.05) 6)  Hepatotoxicity, Drug-induced, Risk of  (ICD-V58.69) 7)  Abdominal Pain, Epigastric  (ICD-789.06) 8)  Glucose Intolerance  (ICD-271.3) 9)  Gerd  (ICD-530.81) 10)  Allergic Rhinitis  (ICD-477.9) 11)  Anxiety   (ICD-300.00) 12)  Low Back Pain  (ICD-724.2) 13)  Erectile Dysfunction  (ICD-302.72) 14)  Smoker  (ICD-305.1) 15)  Hyperlipidemia  (ICD-272.4) 16)  Chest Pain  (ICD-786.50)  Medications Prior to Update: 1)  Tramadol Hcl 50 Mg Tabs (Tramadol Hcl) .... 1/2 By Mouth Two Times A Day As Needed 2)  Ecotrin Low Strength 81 Mg  Tbec (Aspirin) .Marland Kitchen.. 1po Qd 3)  Pravachol 40 Mg Tabs (Pravastatin Sodium) .... 2 By Mouth Once Daily 4)  Pantoprazole Sodium 40 Mg Tbec (Pantoprazole Sodium) .Marland Kitchen.. 1 By Mouth Once Daily 30 Minutes Before A Meal  Current Medications (verified): 1)  Tramadol Hcl 50 Mg Tabs (Tramadol Hcl) .... 1/2 By Mouth Two Times A Day As Needed 2)  Ecotrin Low Strength 81 Mg  Tbec (Aspirin) .Marland Kitchen.. 1po Qd 3)  Pravachol 40 Mg Tabs (Pravastatin Sodium) .... 2 By Mouth Once Daily 4)  Pantoprazole Sodium 40 Mg Tbec (Pantoprazole Sodium) .Marland Kitchen.. 1 By Mouth Once Daily 30 Minutes Before A Meal 5)  Dexilant 60 Mg Cpdr (Dexlansoprazole) .Marland Kitchen.. 1po Once Daily  Allergies (verified): No Known Drug Allergies  Past History:  Past Surgical History: Last updated: 01/16/2008 Tonsillectomy  Family History: Last updated: 06/26/2010 father with prostate cancer No FH of Colon Cancer: Family History of Heart Disease: Both sides of the family   Social History: Last updated: 06/26/2010 Self Employed printing and sells flooring Married  no biological children Patient is a former smoker.  Alcohol Use - yes: 3-6 daily  Illicit Drug Use - no  Risk Factors: Smoking Status: quit (06/26/2010)  Past Medical History: Hyperlipidemia Low back pain - chronic lumbar disc disease Anxiety E.D. Allergic rhinitis GERD glucose intolerance ETOH abuse  Family History: Reviewed history from 06/26/2010 and no changes required. father with prostate cancer No FH of Colon Cancer: Family History of Heart Disease: Both sides of the family   Social History: Reviewed history from 06/26/2010 and no changes  required. 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  Alcohol Use - yes: 3-6 daily  Illicit Drug Use - no  Review of Systems  The patient denies anorexia, fever, vision loss, decreased hearing, hoarseness, chest pain, syncope, dyspnea on exertion, peripheral edema, prolonged cough, headaches, hemoptysis, abdominal pain, melena, hematochezia, severe indigestion/heartburn, hematuria, muscle weakness, suspicious skin lesions, transient blindness, difficulty walking, depression, unusual weight change, abnormal bleeding, enlarged lymph nodes, and angioedema.         all otherwise negative per pt -    Physical Exam  General:  alert and well-developed.   Head:  normocephalic and atraumatic.   Eyes:  vision grossly intact, pupils equal, and pupils round.   Ears:  R ear normal and L ear normal.   Nose:  no external deformity and no nasal discharge.   Mouth:  no gingival abnormalities and pharynx pink and moist.   Neck:  supple and no masses.   Lungs:  normal respiratory effort and normal breath sounds.   Heart:  normal rate and regular rhythm.   Abdomen:  soft, non-tender, and normal bowel sounds.   Msk:  no joint tenderness and no joint swelling.   Extremities:  no edema, no erythema  Neurologic:  cranial nerves II-XII intact and strength normal in all extremities.   Skin:  color normal and no rashes.   Psych:  not depressed appearing and moderately anxious.     Impression & Recommendations:  Problem # 1:  Preventive Health Care (ICD-V70.0) Overall doing well, age appropriate education and counseling updated, referral for preventive services and immunizations addressed, dietary counseling and smoking status adressed , most recent labs reviewed, ecg reviewed I have personally reviewed and have noted 1.The patient's medical and social history 2.Their use of alcohol, tobacco or illicit drugs 3.Their current medications and  supplements 4. Functional ability including ADL's, fall risk, home safety risk, hearing & visual impairment  5.Diet and physical activities 6.Evidence for depression or mood disorders The patients weight, height, BMI  have been recorded in the chart I have made referrals, counseling and provided education to the patient based review of the above  Orders: EKG w/ Interpretation (93000)  Problem # 2:  GASTRITIS (ICD-535.50)  His updated medication list for this problem includes:    Pantoprazole Sodium 40 Mg Tbec (Pantoprazole sodium) .Marland Kitchen... 1 by mouth once daily 30 minutes before a meal    Dexilant 60 Mg Cpdr (Dexlansoprazole) .Marland Kitchen... 1po once daily urged to quit alcohol, but also gave rx and coupon for the dexilant for trial use  Discussed use of medication, as well as lifestyle changes.   Complete Medication List: 1)  Tramadol Hcl 50 Mg Tabs (Tramadol hcl) .... 1/2 by mouth two times a day as needed 2)  Ecotrin Low Strength 81 Mg Tbec (Aspirin) .Marland Kitchen.. 1po qd 3)  Pravachol 40 Mg Tabs (Pravastatin sodium) .... 2 by mouth once daily 4)  Pantoprazole Sodium 40 Mg Tbec (Pantoprazole sodium) .Marland Kitchen.. 1 by mouth once daily 30 minutes before a meal 5)  Dexilant 60 Mg Cpdr (Dexlansoprazole) .Marland Kitchen.. 1po once daily  Other Orders: Tdap => 81yrs IM (84132) Admin 1st Vaccine (44010)  Patient Instructions: 1)  you had the tetanus shot today 2)  you are given the prescription for dexilant and the coupon today - if affordable, please start once per day, and hold on taking the pantoprazole 3)  Continue all previous medications as before this visit  4)  Please stop drinking alcohol 5)  Continue all previous medications as before this visit  6)  Please schedule a follow-up appointment in 1 year, or sooner if needed Prescriptions: DEXILANT 60 MG CPDR (DEXLANSOPRAZOLE) 1po once daily  #30 x 11   Entered and Authorized by:   Corwin Levins MD   Signed by:   Corwin Levins MD on 12/31/2010   Method used:   Print then  Give to Patient   RxID:   [id number]    Orders Added: 1)  EKG w/ Interpretation [93000] 2)  Tdap => 93yrs IM [90715] 3)  Admin 1st Vaccine [90471] 4)  Est. Patient 40-64 years [95638]   Immunizations Administered:  Tetanus Vaccine:    Vaccine Type: Tdap    Site: right deltoid    Mfr: GlaxoSmithKline    Dose: 0.5 ml    Route: IM    Given by: Zella Ball Ewing CMA (AAMA)    Exp. Date: 09/11/2012    Lot #: [id number]    VIS given: 10/10/08 version given December 31, 2010.   Immunizations Administered:  Tetanus Vaccine:    Vaccine Type: Tdap    Site: right deltoid    Mfr: GlaxoSmithKline    Dose: 0.5 ml    Route: IM    Given by: Zella Ball Ewing CMA (AAMA)    Exp. Date: 09/11/2012    Lot #: [id number]    VIS given: 10/10/08 version given December 31, 2010.

## 2011-04-27 ENCOUNTER — Other Ambulatory Visit: Payer: Self-pay

## 2011-04-27 MED ORDER — TRAMADOL HCL 50 MG PO TABS: 25.0000 mg | ORAL_TABLET | Freq: Two times a day (BID) | ORAL | Status: AC | PRN

## 2011-04-27 NOTE — Telephone Encounter (Signed)
Done per emr 

## 2011-04-27 NOTE — Telephone Encounter (Signed)
Pt called requesting refill of medication, Last written 06/03/2010 #30 X 5. Last CPX 12/31/2010.

## 2011-07-01 ENCOUNTER — Other Ambulatory Visit: Payer: Self-pay | Admitting: Internal Medicine

## 2011-08-14 ENCOUNTER — Other Ambulatory Visit: Payer: Self-pay | Admitting: Internal Medicine

## 2011-08-18 NOTE — Telephone Encounter (Signed)
Patient is due for a Colon Recall or an appointment. Medication refilled for 3 months no more until apppointment.

## 2011-10-14 ENCOUNTER — Encounter: Payer: Self-pay | Admitting: Internal Medicine

## 2011-11-10 ENCOUNTER — Telehealth: Payer: Self-pay | Admitting: Internal Medicine

## 2011-11-11 NOTE — Telephone Encounter (Signed)
Called and left a message on patient's voicemail for him to return my call.

## 2011-11-12 ENCOUNTER — Telehealth: Payer: Self-pay | Admitting: Internal Medicine

## 2011-11-12 ENCOUNTER — Telehealth: Payer: Self-pay | Admitting: Gastroenterology

## 2011-11-13 ENCOUNTER — Other Ambulatory Visit: Payer: Self-pay | Admitting: Gastroenterology

## 2011-11-13 MED ORDER — PANTOPRAZOLE SODIUM 40 MG PO TBEC: DELAYED_RELEASE_TABLET | ORAL | Status: DC

## 2011-11-13 NOTE — Telephone Encounter (Signed)
Talked to the patient and told him that he needed to schedule and appointment. I will call in 90 day this time but after this he needs and office visit.

## 2012-01-14 ENCOUNTER — Other Ambulatory Visit: Payer: Self-pay | Admitting: Internal Medicine

## 2012-01-14 MED ORDER — PANTOPRAZOLE SODIUM 40 MG PO TBEC: DELAYED_RELEASE_TABLET | ORAL | Status: DC

## 2012-01-14 NOTE — Telephone Encounter (Signed)
Medication refilled for #30until appointment.

## 2012-02-03 ENCOUNTER — Ambulatory Visit (INDEPENDENT_AMBULATORY_CARE_PROVIDER_SITE_OTHER): Payer: BC Managed Care – PPO | Admitting: Internal Medicine

## 2012-02-03 ENCOUNTER — Telehealth: Payer: Self-pay

## 2012-02-03 ENCOUNTER — Encounter: Payer: Self-pay | Admitting: Internal Medicine

## 2012-02-03 VITALS — BP 128/80 | HR 68 | Ht 72.0 in | Wt 160.8 lb

## 2012-02-03 DIAGNOSIS — R1013 Epigastric pain: Secondary | ICD-10-CM

## 2012-02-03 DIAGNOSIS — Z0000 Encounter for general adult medical examination without abnormal findings: Secondary | ICD-10-CM

## 2012-02-03 DIAGNOSIS — F17200 Nicotine dependence, unspecified, uncomplicated: Secondary | ICD-10-CM

## 2012-02-03 DIAGNOSIS — K219 Gastro-esophageal reflux disease without esophagitis: Secondary | ICD-10-CM

## 2012-02-03 DIAGNOSIS — F1010 Alcohol abuse, uncomplicated: Secondary | ICD-10-CM

## 2012-02-03 MED ORDER — PANTOPRAZOLE SODIUM 40 MG PO TBEC: DELAYED_RELEASE_TABLET | ORAL | Status: DC

## 2012-02-03 NOTE — Patient Instructions (Addendum)
You have been given a separate informational sheet regarding your tobacco use, the importance of quitting and local resources to help you quit. You should try to reduce alcohol to 1-2 drinks (beers) maximum daily as we discussed. A screening colonoscopy is recommended - you can check with your insurance carrier about the cost. The colonoscopy would be for screening - diagnosis code V76.51. If you want to schedule you may call us back and arrange this directly without a visit with me.  Your pantoprazle prescription was sent to [facility].  Please schedule a primary care follow-up visit also.    Alcohol Problems Most adults who drink alcohol drink in moderation (not a lot) are at low risk for developing problems related to their drinking. However, all drinkers, including low-risk drinkers, should know about the health risks connected with drinking alcohol. RECOMMENDATIONS FOR LOW-RISK DRINKING  Drink in moderation. Moderate drinking is defined as follows:   Men - no more than 2 drinks per day.   Nonpregnant women - no more than 1 drink per day.   Over age 84 - no more than 1 drink per day.  A standard drink is 12 grams of pure alcohol, which is equal to a 12 ounce bottle of beer or wine cooler, a 5 ounce glass of wine, or 1.5 ounces of distilled spirits (such as whiskey, brandy, vodka, or rum).  ABSTAIN FROM (DO NOT DRINK) ALCOHOL:  When pregnant or considering pregnancy.   When taking a medication that interacts with alcohol.   If you are alcohol dependent.   A medical condition that prohibits drinking alcohol (such as ulcer, liver disease, or heart disease).  DISCUSS WITH YOUR CAREGIVER:  If you are at risk for coronary heart disease, discuss the potential benefits and risks of alcohol use: Light to moderate drinking is associated with lower rates of coronary heart disease in certain populations (for example, men over age 21 and postmenopausal women). Infrequent or nondrinkers are  advised not to begin light to moderate drinking to reduce the risk of coronary heart disease so as to avoid creating an alcohol-related problem. Similar protective effects can likely be gained through proper diet and exercise.   Women and the elderly have smaller amounts of body water than men. As a result women and the elderly achieve a higher blood alcohol concentration after drinking the same amount of alcohol.   Exposing a fetus to alcohol can cause a broad range of birth defects referred to as Fetal Alcohol Syndrome (FAS) or Alcohol-Related Birth Defects (ARBD). Although FAS/ARBD is connected with excessive alcohol consumption during pregnancy, studies also have reported neurobehavioral problems in infants born to mothers reporting drinking an average of 1 drink per day during pregnancy.   Heavier drinking (the consumption of more than 4 drinks per occasion by men and more than 3 drinks per occasion by women) impairs learning (cognitive) and psychomotor functions and increases the risk of alcohol-related problems, including accidents and injuries.  CAGE QUESTIONS:   Have you ever felt that you should Cut down on your drinking?   Have people Annoyed you by criticizing your drinking?   Have you ever felt bad or Guilty about your drinking?   Have you ever had a drink first thing in the morning to steady your nerves or get rid of a hangover (Eye opener)?  If you answered positively to any of these questions: You may be at risk for alcohol-related problems if alcohol consumption is:   Men: Greater than 14 drinks  per week or more than 4 drinks per occasion.   Women: Greater than 7 drinks per week or more than 3 drinks per occasion.  Do you or your family have a medical history of alcohol-related problems, such as:  Blackouts.   Sexual dysfunction.   Depression.   Trauma.   Liver dysfunction.   Sleep disorders.   Hypertension.   Chronic abdominal pain.   Has your drinking ever  caused you problems, such as problems with your family, problems with your work (or school) performance, or accidents/injuries?   Do you have a compulsion to drink or a preoccupation with drinking?   Do you have poor control or are you unable to stop drinking once you have started?   Do you have to drink to avoid withdrawal symptoms?   Do you have problems with withdrawal such as tremors, nausea, sweats, or mood disturbances?   Does it take more alcohol than in the past to get you high?   Do you feel a strong urge to drink?   Do you change your plans so that you can have a drink?   Do you ever drink in the morning to relieve the shakes or a hangover?  If you have answered a number of the previous questions positively, it may be time for you to talk to your caregivers, family, and friends and see if they think you have a problem. Alcoholism is a chemical dependency that keeps getting worse and will eventually destroy your health and relationships. Many alcoholics end up dead, impoverished, or in prison. This is often the end result of all chemical dependency.  Do not be discouraged if you are not ready to take action immediately.   Decisions to change behavior often involve up and down desires to change and feeling like you cannot decide.   Try to think more seriously about your drinking behavior.   Think of the reasons to quit.  WHERE TO GO FOR ADDITIONAL INFORMATION   The National Institute on Alcohol Abuse and Alcoholism (NIAAA)www.niaaa.nih.gov   [employer] on Alcoholism and Drug Dependence (NCADD)www.ncadd.org   American Society of Addiction Medicine (ASAM)www.https://anderson-johnson.com/  Document Released: 11/09/2005 Document Revised: 10/29/2011 Document Reviewed: 06/27/2008 [facility] Patient Information 2012 [place], [place].

## 2012-02-03 NOTE — Assessment & Plan Note (Signed)
He continues to drink 6 beers a day. He is aware of the potential health risks, I reviewed the possibility of cirrhosis and GI tract or other malignancy. He is not interested in stopping this, it is recommended that he cut down. Information provided.

## 2012-02-03 NOTE — Assessment & Plan Note (Signed)
He continues with these symptoms. He inserted adapter to them. His alcohol could be contributing some but he has a fairly classic picture of functional dyspepsia with chronic epigastric discomfort and burning and early satiety-like symptoms. His weight has really stabilized.

## 2012-02-03 NOTE — Assessment & Plan Note (Signed)
Pantoprazole controls his heartburn so we'll continue that.

## 2012-02-03 NOTE — Assessment & Plan Note (Signed)
He was counseled about the health effects of this and the need to quit.

## 2012-02-03 NOTE — Progress Notes (Signed)
  Subjective:    Patient ID: Richard Santos, male    DOB: [birth year], 51 y.o.   MRN: [id number]  HPI This is a pleasant 51 year old white man I met back in 2011. He had constant epigastric burning and some early satiety and some weight loss. An upper GI endoscopy showed some mild esophagitis changes but no true gastritis on biopsy. Pantoprazole provides relief of heartburn but he continues to have chronic epigastric burning and some early satiety. His appetite is so-so, his weight has stabilized over time.  He just says he has adapted to the chronic epigastric pain.  He is aware of the health risk of alcohol and smoking but is not interested in cutting back or stopping these at this time.  Medications and allergies, past medical and surgical history as well as social and family history are reviewed and updated in the electronic medical record.  Review of Systems As above    Objective:   Physical Exam General:  NAD Eyes:   anicteric Lungs:  clear Heart:  S1S2 no rubs, murmurs or gallops Abdomen:  soft and nontender, BS+ Ext:   no edema    Data Reviewed:   Lab Results  Component Value Date   WBC 8.3 12/29/2010   HGB 15.9 12/29/2010   HCT 45.0 12/29/2010   MCV 99.0 12/29/2010   PLT 192.0 12/29/2010     Chemistry      Component Value Date/Time   NA 138 12/29/2010 1427   K 4.1 12/29/2010 1427   CL 100 12/29/2010 1427   CO2 28 12/29/2010 1427   BUN 10 12/29/2010 1427   CREATININE 0.7 12/29/2010 1427      Component Value Date/Time   CALCIUM 9.0 12/29/2010 1427   ALKPHOS 58 12/29/2010 1427   AST 39* 12/29/2010 1427   ALT 47 12/29/2010 1427   BILITOT 0.6 12/29/2010 1427            Assessment & Plan:  Please see problem oriented charting as well.  In addition to that is that he is at age for colorectal cancer screening. A routine screening colonoscopy is recommended. He does have other options. He is concerned about the cost of a screening colonoscopy and I have given him information to check with  his insurance carrier as to what the cost would be says it is a screening and preventative procedure. He will schedule an appointment if desired. He has a very high to Dr. Maryclare Labrador plan and says he is still paying off the endoscopy procedure from 2011.

## 2012-02-03 NOTE — Telephone Encounter (Signed)
Put order in for lab. 

## 2012-02-24 ENCOUNTER — Other Ambulatory Visit (INDEPENDENT_AMBULATORY_CARE_PROVIDER_SITE_OTHER): Payer: BC Managed Care – PPO

## 2012-02-24 DIAGNOSIS — Z0000 Encounter for general adult medical examination without abnormal findings: Secondary | ICD-10-CM

## 2012-02-24 LAB — CBC WITH DIFFERENTIAL/PLATELET
Basophils Relative: 0.5 % (ref 0.0–3.0)
Eosinophils Absolute: 0.5 10*3/uL (ref 0.0–0.7)
Lymphs Abs: 2.1 10*3/uL (ref 0.7–4.0)
MCHC: 34.7 g/dL (ref 30.0–36.0)
MCV: 97.6 fl (ref 78.0–100.0)
Monocytes Absolute: 0.5 10*3/uL (ref 0.1–1.0)
Neutrophils Relative %: 59.1 % (ref 43.0–77.0)
Platelets: 186 10*3/uL (ref 150.0–400.0)

## 2012-02-24 LAB — HEPATIC FUNCTION PANEL
AST: 38 U/L — ABNORMAL HIGH (ref 0–37)
Total Bilirubin: 0.7 mg/dL (ref 0.3–1.2)

## 2012-02-24 LAB — URINALYSIS, ROUTINE W REFLEX MICROSCOPIC
Ketones, ur: NEGATIVE
Leukocytes, UA: NEGATIVE
Nitrite: NEGATIVE
Specific Gravity, Urine: 1.015 (ref 1.000–1.030)
Urobilinogen, UA: 0.2 (ref 0.0–1.0)
pH: 7.5 (ref 5.0–8.0)

## 2012-02-24 LAB — LIPID PANEL
Cholesterol: 190 mg/dL (ref 0–200)
Triglycerides: 117 mg/dL (ref 0.0–149.0)

## 2012-02-24 LAB — BASIC METABOLIC PANEL
BUN: 14 mg/dL (ref 6–23)
Calcium: 9.4 mg/dL (ref 8.4–10.5)
GFR: 99.84 mL/min (ref >60.00)
Potassium: 4.2 mEq/L (ref 3.5–5.1)
Sodium: 136 mEq/L (ref 135–145)

## 2012-02-24 LAB — TSH: TSH: 0.77 u[IU]/mL (ref 0.35–5.50)

## 2012-02-27 ENCOUNTER — Encounter: Payer: Self-pay | Admitting: Internal Medicine

## 2012-02-27 DIAGNOSIS — R7302 Impaired glucose tolerance (oral): Secondary | ICD-10-CM | POA: Insufficient documentation

## 2012-02-27 DIAGNOSIS — Z0000 Encounter for general adult medical examination without abnormal findings: Secondary | ICD-10-CM | POA: Insufficient documentation

## 2012-02-27 DIAGNOSIS — M545 Low back pain, unspecified: Secondary | ICD-10-CM | POA: Insufficient documentation

## 2012-02-27 DIAGNOSIS — G8929 Other chronic pain: Secondary | ICD-10-CM | POA: Insufficient documentation

## 2012-02-27 DIAGNOSIS — Z0001 Encounter for general adult medical examination with abnormal findings: Secondary | ICD-10-CM | POA: Insufficient documentation

## 2012-02-27 DIAGNOSIS — J309 Allergic rhinitis, unspecified: Secondary | ICD-10-CM | POA: Insufficient documentation

## 2012-03-02 ENCOUNTER — Encounter: Payer: Self-pay | Admitting: Internal Medicine

## 2012-03-02 ENCOUNTER — Ambulatory Visit (INDEPENDENT_AMBULATORY_CARE_PROVIDER_SITE_OTHER): Payer: BC Managed Care – PPO | Admitting: Internal Medicine

## 2012-03-02 VITALS — BP 110/62 | HR 94 | Temp 97.5°F | Ht 72.0 in | Wt 159.5 lb

## 2012-03-02 DIAGNOSIS — Z0000 Encounter for general adult medical examination without abnormal findings: Secondary | ICD-10-CM

## 2012-03-02 DIAGNOSIS — J309 Allergic rhinitis, unspecified: Secondary | ICD-10-CM

## 2012-03-02 MED ORDER — FLUTICASONE PROPIONATE 50 MCG/ACT NA SUSP: 2.0000 | Freq: Every day | NASAL | Status: DC

## 2012-03-02 MED ORDER — ASPIRIN 81 MG PO TBEC: 81.0000 mg | DELAYED_RELEASE_TABLET | Freq: Every day | ORAL | Status: AC

## 2012-03-02 MED ORDER — PRAVASTATIN SODIUM 40 MG PO TABS: 40.0000 mg | ORAL_TABLET | Freq: Every day | ORAL | Status: DC

## 2012-03-02 NOTE — Assessment & Plan Note (Signed)

## 2012-03-02 NOTE — Progress Notes (Signed)
Subjective:    Patient ID: Richard Santos, male    DOB: 1961/09/22, 51 y.o.   MRN: [id number]  HPI  Here for wellness and f/u;  Overall doing ok;  Pt denies CP, worsening SOB, DOE, wheezing, orthopnea, PND, worsening LE edema, palpitations, dizziness or syncope.  Pt denies neurological change such as new Headache, facial or extremity weakness.  Pt denies polydipsia, polyuria, or low sugar symptoms. Pt states overall good compliance with treatment and medications, good tolerability, and trying to follow lower cholesterol diet.  Pt denies worsening depressive symptoms, suicidal ideation or panic. No fever, wt loss, night sweats, loss of appetite, or other constitutional symptoms.  Pt states good ability with ADL's, low fall risk, home safety reviewed and adequate, no significant changes in hearing or vision, and occasionally active with exercise.    Did see GI recently with colonoscopy scheduled for May, cont'd on protonix for dyspepsia, urged to reduce ETOH.  Does have several wks ongoing nasal allergy symptoms with clear congestion, itch and sneeze.   Past Medical History  Diagnosis Date  . HLD (hyperlipidemia)   . Chronic LBP   . Lumbar disc disease   . Anxiety   . ED (erectile dysfunction)   . Allergic rhinitis   . Alcohol abuse   . GERD (gastroesophageal reflux disease)   . Functional dyspepsia   . ETHANOL ABUSE 12/31/2010    6 beers daily for years    . Impaired glucose tolerance 02/27/2012   Past Surgical History  Procedure Date  . Tonsillectomy   . Upper gastrointestinal endoscopy     reports that he has been smoking Cigarettes.  He has never used smokeless tobacco. He reports that he drinks alcohol. He reports that he does not use illicit drugs. family history includes Heart disease in an unspecified family member and Prostate cancer in his father. No Known Allergies / Current Outpatient Prescriptions on File Prior to Visit  Medication Sig Dispense Refill  . pantoprazole (PROTONIX)  40 MG tablet Take 1 tablet by mouth 30 minutes before a meal  90 tablet  3  . pravastatin (PRAVACHOL) 40 MG tablet Take 1 tablet (40 mg total) by mouth daily.  90 tablet  3  . fluticasone (FLONASE) 50 MCG/ACT nasal spray Place 2 sprays into the nose daily.  16 g  2   Review of Systems Review of Systems  Constitutional: Negative for diaphoresis, activity change, appetite change and unexpected weight change.  HENT: Negative for hearing loss, ear pain, facial swelling, mouth sores and neck stiffness.   Eyes: Negative for pain, redness and visual disturbance.  Respiratory: Negative for shortness of breath and wheezing.   Cardiovascular: Negative for chest pain and palpitations.  Gastrointestinal: Negative for diarrhea, blood in stool, abdominal distention and rectal pain.  Genitourinary: Negative for hematuria, flank pain and decreased urine volume.  Musculoskeletal: Negative for myalgias and joint swelling.  Skin: Negative for color change and wound.  Neurological: Negative for syncope and numbness.  Hematological: Negative for adenopathy.  Psychiatric/Behavioral: Negative for hallucinations, self-injury, decreased concentration and agitation.      Objective:   Physical Exam BP 110/62  Pulse 94  Temp(Src) 97.5 F (36.4 C) (Oral)  Ht 6' (1.829 m)  Wt 159 lb 8 oz (72.349 kg)  BMI 21.63 kg/m2  SpO2 97% Physical Exam  VS noted Constitutional: Pt is oriented to person, place, and time. Appears well-developed and well-nourished.  HENT:  Head: Normocephalic and atraumatic.  Right Ear: External ear normal.  Left Ear: External ear normal.  Nose: Nose normal.  Mouth/Throat: Oropharynx is clear and moist.  Bilat tm's mild erythema.  Sinus nontender.  Pharynx mild erythema Eyes: Conjunctivae and EOM are normal. Pupils are equal, round, and reactive to light.  Neck: Normal range of motion. Neck supple. No JVD present. No tracheal deviation present.  Cardiovascular: Normal rate, regular  rhythm, normal heart sounds and intact distal pulses.   Pulmonary/Chest: Effort normal and breath sounds normal.  Abdominal: Soft. Bowel sounds are normal. There is no tenderness.  Musculoskeletal: Normal range of motion. Exhibits no edema.  Lymphadenopathy:  Has no cervical adenopathy.  Neurological: Pt is alert and oriented to person, place, and time. Pt has normal reflexes. No cranial nerve deficit.  Skin: Skin is warm and dry. No rash noted.  Psychiatric:  Has  normal mood and affect. Behavior is normal. mild nervous    Assessment & Plan:

## 2012-03-02 NOTE — Patient Instructions (Signed)
Take all new medications as prescribed - the allergy spray Continue all other medications as before Please keep your appointments with your specialists as you have planned - the colonoscopy as you have planned Please return in 1 year for your yearly visit, or sooner if needed, with Lab testing done 3-5 days before

## 2012-03-06 ENCOUNTER — Encounter: Payer: Self-pay | Admitting: Internal Medicine

## 2012-03-06 NOTE — Assessment & Plan Note (Signed)
To add flonase asd,  to f/u any worsening symptoms or concerns  

## 2012-03-13 ENCOUNTER — Emergency Department: Payer: Self-pay | Admitting: Emergency Medicine

## 2012-03-15 ENCOUNTER — Telehealth: Payer: Self-pay

## 2012-03-15 DIAGNOSIS — IMO0002 Reserved for concepts with insufficient information to code with codable children: Secondary | ICD-10-CM

## 2012-03-15 NOTE — Telephone Encounter (Signed)
The patient had an accident this past weekend with his lawnmower.  He cut the pads off his middle and ring finger.  He went to [facility] to be seen and waiting on referral to ortho at [facility], but has not been called.  Informed the patient he could  schedule today with JWJ as followup and get ortho referral from JWJ.  The patient will call at lunch time to schedule OV with JWJ if not heard back from [facility] on referral, please advise

## 2012-03-15 NOTE — Telephone Encounter (Signed)
Done for hand surgury asap

## 2012-03-15 NOTE — Telephone Encounter (Signed)
Patient informed of referral

## 2012-04-05 ENCOUNTER — Ambulatory Visit (AMBULATORY_SURGERY_CENTER): Payer: BC Managed Care – PPO

## 2012-04-05 VITALS — Ht 72.0 in | Wt 160.2 lb

## 2012-04-05 DIAGNOSIS — Z1211 Encounter for screening for malignant neoplasm of colon: Secondary | ICD-10-CM

## 2012-04-05 MED ORDER — PEG-KCL-NACL-NASULF-NA ASC-C 100 G PO SOLR: 1.0000 | Freq: Once | ORAL | Status: AC

## 2012-04-06 ENCOUNTER — Encounter: Payer: Self-pay | Admitting: Internal Medicine

## 2012-04-19 ENCOUNTER — Encounter: Payer: Self-pay | Admitting: Internal Medicine

## 2012-04-19 ENCOUNTER — Ambulatory Visit (AMBULATORY_SURGERY_CENTER): Payer: BC Managed Care – PPO | Admitting: Internal Medicine

## 2012-04-19 VITALS — BP 135/87 | HR 84 | Temp 97.3°F | Resp 16 | Ht 72.0 in | Wt 160.0 lb

## 2012-04-19 DIAGNOSIS — D126 Benign neoplasm of colon, unspecified: Secondary | ICD-10-CM

## 2012-04-19 DIAGNOSIS — K5730 Diverticulosis of large intestine without perforation or abscess without bleeding: Secondary | ICD-10-CM

## 2012-04-19 DIAGNOSIS — Z1211 Encounter for screening for malignant neoplasm of colon: Secondary | ICD-10-CM

## 2012-04-19 HISTORY — PX: COLONOSCOPY W/ POLYPECTOMY: SHX1380

## 2012-04-19 MED ORDER — SODIUM CHLORIDE 0.9 % IV SOLN: 500.0000 mL | INTRAVENOUS | Status: DC

## 2012-04-19 NOTE — Patient Instructions (Addendum)

## 2012-04-19 NOTE — Progress Notes (Signed)
Patient did not experience any of the following events: a burn prior to discharge; a fall within the facility; wrong site/side/patient/procedure/implant event; or a hospital transfer or hospital admission upon discharge from the facility. (G8907) Patient did not have preoperative order for IV antibiotic SSI prophylaxis. (G8918)  

## 2012-04-19 NOTE — Op Note (Signed)
[facility] [address] [place], [place]  [address]  COLONOSCOPY PROCEDURE REPORT  PATIENT:  Anthone, Prieur  MR#:  [id number] BIRTHDATE:  Nov 09, 1961, 50 yrs. old  GENDER:  male ENDOSCOPIST:  Iva Boop, MD, [facility]  PROCEDURE DATE:  04/19/2012 PROCEDURE:  Colonoscopy with biopsy and snare polypectomy ASA CLASS:  Class II INDICATIONS:  Routine Risk Screening MEDICATIONS:   These medications were titrated to patient response per physician's verbal order, Fentanyl 75 mcg IV, Versed 8 mg  DESCRIPTION OF PROCEDURE:   After the risks benefits and alternatives of the procedure were thoroughly explained, informed consent was obtained.  Digital rectal exam was performed and revealed no abnormalities and normal prostate.   The LB CF-[id number] endoscope was introduced through the anus and advanced to the cecum, which was identified by both the appendix and ileocecal valve, without limitations.  The quality of the prep was excellent, using MoviPrep.  The instrument was then slowly withdrawn as the colon was fully examined. <<PROCEDUREIMAGES>>  FINDINGS:  Three polyps were found. 1-2 mm splenic flexure polyp removed with forceps, 5 mm sigmoid polyps cold snared. All sent to pathology.  Mild diverticulosis was found in the sigmoid colon. This was otherwise a normal examination of the colon. Includes right colon retroflexion.   Retroflexed views in the rectum revealed no abnormalities.    The time to cecum =  3:32  minutes. The scope was then withdrawn in 13:50 minutes from the cecum and the procedure completed. COMPLICATIONS:  None ENDOSCOPIC IMPRESSION: 1) Three polyps removed, largest 5 mm 2) Mild diverticulosis in the sigmoid colon 3) Otherwise normal examination, excellent prep  REPEAT EXAM:  In for Colonoscopy, pending biopsy results.  Iva Boop, MD, [facility]  CC:  Corwin Levins, MD and The Patient  n. eSIGNED:   Iva Boop at 04/19/2012 09:18  AM  Domingo Pulse, [phone]

## 2012-04-20 ENCOUNTER — Telehealth: Payer: Self-pay | Admitting: *Deleted

## 2012-04-20 NOTE — Telephone Encounter (Signed)
  Follow up Call-  Call back number 04/19/2012  Post procedure Call Back phone  # [phone]  Permission to leave phone message Yes     Patient questions:  Do you have a fever, pain , or abdominal swelling? no Pain Score  0 *  Have you tolerated food without any problems? yes  Have you been able to return to your normal activities? yes  Do you have any questions about your discharge instructions: Diet   no Medications  no Follow up visit  no  Do you have questions or concerns about your Care? no  Actions: * If pain score is 4 or above: No action needed, pain <4.

## 2012-04-26 ENCOUNTER — Encounter: Payer: Self-pay | Admitting: Internal Medicine

## 2012-04-26 ENCOUNTER — Other Ambulatory Visit: Payer: Self-pay | Admitting: Internal Medicine

## 2012-04-26 DIAGNOSIS — Z860100 Personal history of colon polyps, unspecified: Secondary | ICD-10-CM | POA: Insufficient documentation

## 2012-04-26 NOTE — Progress Notes (Signed)
Quick Note:  Diminutive adenoma Repeat colonoscopy in about 5 years 2018 ______

## 2012-04-26 NOTE — Telephone Encounter (Signed)
Done erx 

## 2012-12-20 ENCOUNTER — Other Ambulatory Visit: Payer: Self-pay | Admitting: Internal Medicine

## 2013-03-07 ENCOUNTER — Telehealth: Payer: Self-pay

## 2013-03-07 DIAGNOSIS — Z0000 Encounter for general adult medical examination without abnormal findings: Secondary | ICD-10-CM

## 2013-03-07 NOTE — Telephone Encounter (Signed)
Labs entered.

## 2013-03-08 ENCOUNTER — Telehealth: Payer: Self-pay

## 2013-03-08 MED ORDER — LOVASTATIN 20 MG PO TABS: 20.0000 mg | ORAL_TABLET | Freq: Every day | ORAL | Status: DC

## 2013-03-08 NOTE — Telephone Encounter (Signed)
Called the patient left a detailed message on both numbers of medication.

## 2013-03-08 NOTE — Telephone Encounter (Signed)
Ok to change to lovastatin 20 qd (should still be on the $4 list) - done erx

## 2013-03-08 NOTE — Telephone Encounter (Signed)
Pt wife calls stating the Pravastatin 40 mg pt takes has changes tiers and is no longer a $4/month presription. He only has a week left of this medication. What else can pt take in place of this? Please advise.

## 2013-04-10 ENCOUNTER — Other Ambulatory Visit (INDEPENDENT_AMBULATORY_CARE_PROVIDER_SITE_OTHER): Payer: BC Managed Care – PPO

## 2013-04-10 DIAGNOSIS — Z0000 Encounter for general adult medical examination without abnormal findings: Secondary | ICD-10-CM

## 2013-04-10 LAB — BASIC METABOLIC PANEL
BUN: 10 mg/dL (ref 6–23)
Chloride: 103 mEq/L (ref 96–112)
GFR: 156.6 mL/min (ref >60.00)
Potassium: 4.6 mEq/L (ref 3.5–5.1)
Sodium: 139 mEq/L (ref 135–145)

## 2013-04-10 LAB — CBC WITH DIFFERENTIAL/PLATELET
Basophils Absolute: 0 10*3/uL (ref 0.0–0.1)
Basophils Relative: 0.4 % (ref 0.0–3.0)
Eosinophils Absolute: 0.4 10*3/uL (ref 0.0–0.7)
Lymphocytes Relative: 20.9 % (ref 12.0–46.0)
MCHC: 35.4 g/dL (ref 30.0–36.0)
Neutrophils Relative %: 65.7 % (ref 43.0–77.0)
Platelets: 187 10*3/uL (ref 150.0–400.0)
RBC: 4.47 Mil/uL (ref 4.22–5.81)

## 2013-04-10 LAB — URINALYSIS, ROUTINE W REFLEX MICROSCOPIC
Bilirubin Urine: NEGATIVE
RBC / HPF: NONE SEEN (ref 0–?)
Specific Gravity, Urine: >=1.03 (ref 1.000–1.030)
Total Protein, Urine: NEGATIVE
Urine Glucose: NEGATIVE
pH: 6 (ref 5.0–8.0)

## 2013-04-10 LAB — LIPID PANEL
Cholesterol: 166 mg/dL (ref 0–200)
LDL Cholesterol: 93 mg/dL (ref 0–99)
Triglycerides: 112 mg/dL (ref 0.0–149.0)
VLDL: 22.4 mg/dL (ref 0.0–40.0)

## 2013-04-10 LAB — HEPATIC FUNCTION PANEL
ALT: 70 U/L — ABNORMAL HIGH (ref 0–53)
AST: 71 U/L — ABNORMAL HIGH (ref 0–37)
Alkaline Phosphatase: 47 U/L (ref 39–117)
Bilirubin, Direct: 0.1 mg/dL (ref 0.0–0.3)
Total Bilirubin: 0.4 mg/dL (ref 0.3–1.2)

## 2013-04-14 ENCOUNTER — Encounter: Payer: Self-pay | Admitting: Internal Medicine

## 2013-04-14 ENCOUNTER — Ambulatory Visit (INDEPENDENT_AMBULATORY_CARE_PROVIDER_SITE_OTHER): Payer: BC Managed Care – PPO | Admitting: Internal Medicine

## 2013-04-14 VITALS — BP 130/82 | HR 85 | Temp 97.1°F | Ht 72.0 in | Wt 150.2 lb

## 2013-04-14 DIAGNOSIS — Z0000 Encounter for general adult medical examination without abnormal findings: Secondary | ICD-10-CM

## 2013-04-14 DIAGNOSIS — M519 Unspecified thoracic, thoracolumbar and lumbosacral intervertebral disc disorder: Secondary | ICD-10-CM

## 2013-04-14 DIAGNOSIS — R972 Elevated prostate specific antigen [PSA]: Secondary | ICD-10-CM

## 2013-04-14 NOTE — Progress Notes (Signed)
Subjective:    Patient ID: Richard Santos, male    DOB: 12-Jun-1961, 52 y.o.   MRN: [id number]  HPI  Here for wellness and f/u;  Overall doing ok;  Pt denies CP, worsening SOB, DOE, wheezing, orthopnea, PND, worsening LE edema, palpitations, dizziness or syncope.  Pt denies neurological change such as new headache, facial or extremity weakness.  Pt denies polydipsia, polyuria, or low sugar symptoms. Pt states overall good compliance with treatment and medications, good tolerability, and has been trying to follow lower cholesterol diet.  Pt denies worsening depressive symptoms, suicidal ideation or panic. No fever, night sweats, wt loss, loss of appetite, or other constitutional symptoms.  Pt states good ability with ADL's, has low fall risk, home safety reviewed and adequate, no other significant changes in hearing or vision, and only occasionally active with exercise.  Played golf for the first time in 1 yr as left hand fingers now improved enough from the lawnmower accident; has Barretts per endo/DrGessner, needs f/u one yr per pt, but chart does not support this. Still putting off lumbar surgury, just dealing with the pain. Past Medical History  Diagnosis Date  . HLD (hyperlipidemia)   . Chronic LBP   . Lumbar disc disease   . Anxiety   . ED (erectile dysfunction)   . Allergic rhinitis   . Alcohol abuse   . GERD (gastroesophageal reflux disease)   . Functional dyspepsia   . ETHANOL ABUSE 12/31/2010    6 beers daily for years    . Impaired glucose tolerance 02/27/2012  . Finger injury     cut pads off 3rd and 4th finger left hand/due to lawn mower accident   Past Surgical History  Procedure Laterality Date  . Tonsillectomy    . Upper gastrointestinal endoscopy      reports that he has been smoking Cigarettes.  He has a 25 pack-year smoking history. He has never used smokeless tobacco. He reports that he drinks about 12.0 ounces of alcohol per week. He reports that he does not use illicit  drugs. family history includes Breast cancer in his sister; Heart disease in his father, mother, and unspecified family member; and Prostate cancer in his father.  There is no history of Colon cancer. No Known Allergies Current Outpatient Prescriptions on File Prior to Visit  Medication Sig Dispense Refill  . lovastatin (MEVACOR) 20 MG tablet Take 1 tablet (20 mg total) by mouth at bedtime.  90 tablet  3  . pantoprazole (PROTONIX) 40 MG tablet TAKE 1 TABLET BY MOUTH 30 MINUTES BEFORE A MEAL  90 tablet  3  . traMADol (ULTRAM) 50 MG tablet TAKE 1/2 TABLET TWICE A DAY AS NEEDED FOR PAIN  30 tablet  5  . fluticasone (FLONASE) 50 MCG/ACT nasal spray Place 2 sprays into the nose daily.  16 g  2   No current facility-administered medications on file prior to visit.   Review of Systems Constitutional: Negative for diaphoresis, activity change, appetite change or unexpected weight change.  HENT: Negative for hearing loss, ear pain, facial swelling, mouth sores and neck stiffness.   Eyes: Negative for pain, redness and visual disturbance.  Respiratory: Negative for shortness of breath and wheezing.   Cardiovascular: Negative for chest pain and palpitations.  Gastrointestinal: Negative for diarrhea, blood in stool, abdominal distention or other pain Genitourinary: Negative for hematuria, flank pain or change in urine volume.  Musculoskeletal: Negative for myalgias and joint swelling.  Skin: Negative for color change and wound.  Neurological: Negative for syncope and numbness. other than noted Hematological: Negative for adenopathy.  Psychiatric/Behavioral: Negative for hallucinations, self-injury, decreased concentration and agitation.      Objective:   Physical Exam BP 130/82  Pulse 85  Temp(Src) 97.1 F (36.2 C) (Oral)  Ht 6' (1.829 m)  Wt 150 lb 4 oz (68.153 kg)  BMI 20.37 kg/m2  SpO2 96% VS noted,  Constitutional: Pt is oriented to person, place, and time. Appears well-developed and  well-nourished.  Head: Normocephalic and atraumatic.  Right Ear: External ear normal.  Left Ear: External ear normal.  Nose: Nose normal.  Mouth/Throat: Oropharynx is clear and moist.  Eyes: Conjunctivae and EOM are normal. Pupils are equal, round, and reactive to light.  Neck: Normal range of motion. Neck supple. No JVD present. No tracheal deviation present.  Cardiovascular: Normal rate, regular rhythm, normal heart sounds and intact distal pulses.   Pulmonary/Chest: Effort normal and breath sounds normal.  Abdominal: Soft. Bowel sounds are normal. There is no tenderness. No HSM  Musculoskeletal: Normal range of motion. Exhibits no edema.  Lymphadenopathy:  Has no cervical adenopathy.  Neurological: Pt is alert and oriented to person, place, and time. Pt has normal reflexes. No cranial nerve deficit.  Skin: Skin is warm and dry. No rash noted.  Psychiatric:  Has  normal mood and affect. Behavior is normal. mild nerovus    Assessment & Plan:

## 2013-04-14 NOTE — Assessment & Plan Note (Signed)
Asympt, for urology referral with PSA > 4

## 2013-04-14 NOTE — Assessment & Plan Note (Signed)

## 2013-04-14 NOTE — Assessment & Plan Note (Signed)
Chronic stable, for f/u ortho for worsening pain

## 2013-04-14 NOTE — Patient Instructions (Addendum)
Please continue all other medications as before Your cholesterol medication was refilled Please continue your efforts at being more active, low cholesterol diet, and weight control. You are otherwise up to date with prevention measures today. Please cut down or stop your alcohol use You will be contacted regarding the referral for: urology Please return in 1 year for your yearly visit, or sooner if needed, with Lab testing done 3-5 days before  Please remember to sign up for My Chart if you have not done so, as this will be important to you in the future with finding out test results, communicating by private email, and scheduling acute appointments online when needed.

## 2013-06-06 ENCOUNTER — Other Ambulatory Visit: Payer: Self-pay | Admitting: Internal Medicine

## 2013-06-07 NOTE — Telephone Encounter (Signed)
Done erx 

## 2013-08-07 ENCOUNTER — Other Ambulatory Visit: Payer: Self-pay | Admitting: Internal Medicine

## 2013-08-07 NOTE — Telephone Encounter (Signed)
Ok to refill? Last OV 5.23.14 Last filled 7.15.14

## 2013-08-08 NOTE — Telephone Encounter (Signed)
Done hardcopy to [facility]  

## 2013-08-09 NOTE — Telephone Encounter (Signed)
Faxed hardcopy to [facility].

## 2013-09-07 ENCOUNTER — Encounter: Payer: Self-pay | Admitting: Gastroenterology

## 2013-09-11 ENCOUNTER — Encounter: Payer: Self-pay | Admitting: Internal Medicine

## 2013-09-11 ENCOUNTER — Ambulatory Visit (INDEPENDENT_AMBULATORY_CARE_PROVIDER_SITE_OTHER): Payer: BC Managed Care – PPO | Admitting: Internal Medicine

## 2013-09-11 ENCOUNTER — Ambulatory Visit (INDEPENDENT_AMBULATORY_CARE_PROVIDER_SITE_OTHER)
Admission: RE | Admit: 2013-09-11 | Discharge: 2013-09-11 | Disposition: A | Discharge status: Home / Self Care | Payer: BC Managed Care – PPO | Source: Ambulatory Visit | Attending: Internal Medicine | Admitting: Internal Medicine

## 2013-09-11 VITALS — BP 100/70 | HR 88 | Ht 72.0 in | Wt 154.2 lb

## 2013-09-11 DIAGNOSIS — R10816 Epigastric abdominal tenderness: Secondary | ICD-10-CM

## 2013-09-11 DIAGNOSIS — R1013 Epigastric pain: Secondary | ICD-10-CM

## 2013-09-11 MED ORDER — IOHEXOL 300 MG/ML  SOLN
100.0000 mL | Freq: Once | INTRAMUSCULAR | Status: AC | PRN
  Administered 2013-09-11: 100 mL via INTRAVENOUS

## 2013-09-11 NOTE — Progress Notes (Signed)
  Subjective:    Patient ID: Toma Copier, male    DOB: 08-Sep-1961, 52 y.o.   MRN: [id number]  HPI The patient is here for followup. He continues to have a relatively constant burning epigastric pain. He has lost about 6 pounds since last year. He says he is reduced drinking 2 about 3 beers daily. Used to drink 6 beers a day. He cannot tell if any foods or medicines cause his problem. It's been a problem for several years he remains concerned and worried about it and is disturbing his quality of life. He denies any bowel habit changes.  Medications, allergies, past medical history, past surgical history, family history and social history are reviewed and updated in the EMR.  Review of Systems As above    Objective:   Physical Exam General:  NAD Eyes:   anicteric Abdomen:  soft with tender epigastrium and medial upper quadrants, BS+, no masses or organomegaly  Data Reviewed:  2013 colonoscopy 2011 upper endoscopy Pathology reports     Assessment & Plan:   1. Abdominal pain, epigastric   2. Epigastric abdominal tenderness    The working diagnosis this is been functional dyspepsia. PPI else heartburn and GERD problems. He has this persistent issue, gastric biopsies were negative in 2011. There's been some weight loss though it is leveled off. Even a chronic alcohol use, I think upper abdominal imaging with CT scanning make sense. Further plans pending that. Consider symptomatic directed treatment with a tricyclic antidepressant at low dose versus buspirone. He should continue to reduce alcohol.  Note that he does not have Barrett's esophagus, I suspected that that was possible but the biopsies in 2011 did not show that.  Current outpatient prescriptions:aspirin 81 MG tablet, Take 81 mg by mouth daily., Disp: , Rfl: ;  ibuprofen (ADVIL,MOTRIN) 200 MG tablet, Take 200 mg by mouth every 6 (six) hours as needed for pain. Takes two daily, Disp: , Rfl: ;  lovastatin (MEVACOR) 20 MG tablet,  Take 1 tablet (20 mg total) by mouth at bedtime., Disp: 90 tablet, Rfl: 3 pantoprazole (PROTONIX) 40 MG tablet, TAKE 1 TABLET BY MOUTH 30 MINUTES BEFORE A MEAL, Disp: 90 tablet, Rfl: 3;  traMADol (ULTRAM) 50 MG tablet, TAKE 1/2 TABLET TWICE A DAY AS NEEDED FOR PAIN, Disp: 30 tablet, Rfl: 1;  fluticasone (FLONASE) 50 MCG/ACT nasal spray, Place 2 sprays into the nose daily., Disp: 16 g, Rfl: 2   CC: Oliver Barre, MD

## 2013-09-11 NOTE — Patient Instructions (Signed)
You have been scheduled for a CT scan of the abdomen and pelvis at  CT ([address]---this is in the same building as [facility]).   You are scheduled on 09/11/13 at 11:30am. You should arrive 15 minutes prior to your appointment time for registration. Please follow the written instructions below on the day of your exam:  WARNING: IF YOU ARE ALLERGIC TO IODINE/X-RAY DYE, PLEASE NOTIFY RADIOLOGY IMMEDIATELY AT [phone]! YOU WILL BE GIVEN A 13 HOUR PREMEDICATION PREP.  1) Do not eat or drink anything after now (4 hours prior to your test) 2) You have been given 2 bottles of oral contrast to drink. The solution may taste   better if refrigerated, but do NOT add ice or any other liquid to this solution. Shake  well before drinking.    Drink 1 bottle of contrast @ 10:30am (2 hours prior to your exam)  You may take any medications as prescribed with a small amount of water except for the following: Metformin, Glucophage, Glucovance, Avandamet, Riomet, Fortamet, Actoplus Met, Janumet, Glumetza or Metaglip. The above medications must be held the day of the exam AND 48 hours after the exam.  The purpose of you drinking the oral contrast is to aid in the visualization of your intestinal tract. The contrast solution may cause some diarrhea. Before your exam is started, you will be given a small amount of fluid to drink. Depending on your individual set of symptoms, you may also receive an intravenous injection of x-ray contrast/dye. Plan on being at [facility] for 30 minutes or long, depending on the type of exam you are having performed.  This test typically takes 30-45 minutes to complete.  If you have any questions regarding your exam or if you need to reschedule, you may call the CT department at [phone] between the hours of 8:00 am and 5:00 pm, Monday-Friday.  ________________________________________________________________________  It is the time of  year to have a vaccination to prevent the flu (influenza virus).  Please have this done through your primary care provider or you can get this done at local pharmacies or the [facility]. It would be very helpful if you notify your primary care provider when and where you had the vaccination given by messaging them in My Chart, leaving a message or faxing the information.   I appreciate the opportunity to care for you.

## 2013-09-12 ENCOUNTER — Telehealth: Payer: Self-pay | Admitting: Internal Medicine

## 2013-09-12 NOTE — Progress Notes (Signed)
Quick Note:  The CT does not show any cause of his pain All organs look ok though there is some atherosclerosis (plaques in arteries) that is not causing problems now  I think he should eliminate alcohol completely to see if that stops the pain - if that does not result in pain relief then we can discuss medication to help  REV 6 weeks please ______

## 2013-09-12 NOTE — Telephone Encounter (Signed)
Message left as requested.  See CT results from 09/11/13

## 2013-10-25 ENCOUNTER — Encounter: Payer: Self-pay | Admitting: Internal Medicine

## 2013-10-25 ENCOUNTER — Ambulatory Visit (INDEPENDENT_AMBULATORY_CARE_PROVIDER_SITE_OTHER): Payer: BC Managed Care – PPO | Admitting: Internal Medicine

## 2013-10-25 VITALS — BP 120/70 | HR 80 | Ht 72.0 in | Wt 156.2 lb

## 2013-10-25 DIAGNOSIS — K219 Gastro-esophageal reflux disease without esophagitis: Secondary | ICD-10-CM

## 2013-10-25 DIAGNOSIS — F1010 Alcohol abuse, uncomplicated: Secondary | ICD-10-CM

## 2013-10-25 DIAGNOSIS — F411 Generalized anxiety disorder: Secondary | ICD-10-CM

## 2013-10-25 DIAGNOSIS — R1013 Epigastric pain: Secondary | ICD-10-CM

## 2013-10-25 MED ORDER — BUSPIRONE HCL 10 MG PO TABS: 10.0000 mg | ORAL_TABLET | Freq: Two times a day (BID) | ORAL | Status: DC

## 2013-10-25 NOTE — Progress Notes (Signed)
    Subjective:    Patient ID: Richard Santos, male    DOB: 12/12/1960, 52 y.o.   MRN: [id number]  HPI The patient is a pleasant middle-aged white man seen for functional dyspepsia and GERD. He has had endoscopy and cross-sectional imaging with CT scan that have been unrevealing. He had persistent burning epigastric pain and inability to eat large meals at times.  Medications, allergies, past medical history, past surgical history, family history and social history are reviewed and updated in the EMR.  Review of Systems He is trying to reduce alcohol he tells me. Continues to smoke.    Objective:   Physical Exam Thin, no acute distress    Assessment & Plan:

## 2013-10-25 NOTE — Assessment & Plan Note (Signed)
Continue pantoprazole. °

## 2013-10-25 NOTE — Assessment & Plan Note (Signed)
Advised again to watch and reduce consumption due to health dangers, specifically discussed liver and pancreas problems. He says he is trying to reduce. I wonder if this is not part related to his dyspepsia problems but previous endoscopy did not demonstrate that.

## 2013-10-25 NOTE — Assessment & Plan Note (Signed)
Try buspirone and see me in about 2 months. Side effects and benefits discussed.

## 2013-10-25 NOTE — Assessment & Plan Note (Addendum)
Not discussed specifically but he has had problems with his off-and-on. The buspirone should help this as well.

## 2013-10-25 NOTE — Patient Instructions (Addendum)
You have been given a separate informational sheet regarding your tobacco use, the importance of quitting and local resources to help you quit.  We have sent medication to your pharmacy for you to pick up at your convenience.  I appreciate the opportunity to care for you.

## 2013-12-14 ENCOUNTER — Other Ambulatory Visit: Payer: Self-pay | Admitting: Internal Medicine

## 2013-12-29 ENCOUNTER — Ambulatory Visit: Payer: BC Managed Care – PPO | Admitting: Internal Medicine

## 2014-01-29 ENCOUNTER — Other Ambulatory Visit: Payer: Self-pay | Admitting: Internal Medicine

## 2014-01-30 NOTE — Telephone Encounter (Signed)
Faxed hardcopy to [place]

## 2014-01-30 NOTE — Telephone Encounter (Signed)
Done hardcopy to [facility]  

## 2014-02-12 ENCOUNTER — Telehealth: Payer: Self-pay

## 2014-02-12 MED ORDER — BUSPIRONE HCL 10 MG PO TABS: 10.0000 mg | ORAL_TABLET | Freq: Two times a day (BID) | ORAL | Status: DC

## 2014-02-12 NOTE — Telephone Encounter (Signed)
Rx sent 

## 2014-02-12 NOTE — Telephone Encounter (Signed)
Ok to refill x 1 year 

## 2014-02-12 NOTE — Telephone Encounter (Signed)
Incoming fax request today for Buspirone 10mg , may I refill this Sir?  Thank you for your time.

## 2014-02-19 ENCOUNTER — Other Ambulatory Visit: Payer: Self-pay

## 2014-02-19 MED ORDER — BUSPIRONE HCL 10 MG PO TABS: 10.0000 mg | ORAL_TABLET | Freq: Two times a day (BID) | ORAL | Status: DC

## 2014-02-19 NOTE — Telephone Encounter (Signed)
Refill sent to incorrect [facility] on 02/12/14.  Per Dr. Carlean Purl on that day he said ok to refill x 1 year.  Phoned in Buspirone refill approval today to [facility] in [place] on [address], # [phone].

## 2014-03-07 ENCOUNTER — Other Ambulatory Visit: Payer: Self-pay | Admitting: Internal Medicine

## 2014-05-14 ENCOUNTER — Other Ambulatory Visit: Payer: Self-pay | Admitting: Internal Medicine

## 2014-05-15 NOTE — Telephone Encounter (Signed)
Faxed hardcopy to [place]

## 2014-06-05 ENCOUNTER — Other Ambulatory Visit: Payer: Self-pay | Admitting: Internal Medicine

## 2014-06-12 ENCOUNTER — Telehealth: Payer: Self-pay

## 2014-06-12 MED ORDER — BUSPIRONE HCL 10 MG PO TABS: 10.0000 mg | ORAL_TABLET | Freq: Two times a day (BID) | ORAL | Status: DC

## 2014-06-12 NOTE — Telephone Encounter (Signed)
Refilled for 1 year in March? Ok to refill x 9

## 2014-06-12 NOTE — Telephone Encounter (Signed)
Refilled for 9 months as Dr. Carlean Purl permitted.

## 2014-06-12 NOTE — Telephone Encounter (Signed)
Incoming fax requesting refill on buspirone 10mg  , one twice a day.  Please advise Sir, thank you.

## 2014-07-06 ENCOUNTER — Other Ambulatory Visit: Payer: Self-pay | Admitting: Internal Medicine

## 2014-07-11 ENCOUNTER — Telehealth: Payer: Self-pay | Admitting: *Deleted

## 2014-07-11 NOTE — Telephone Encounter (Signed)
Left msg on triage stating been tg to get husband lovastatin refilled. Pharmacist haven't heard back from md. Called wife back no answer LMOM per pt records he is overdue for CPX. Last appt 03/2013 must see md for refills...Johny Chess

## 2014-07-23 ENCOUNTER — Other Ambulatory Visit: Payer: Self-pay | Admitting: Internal Medicine

## 2014-07-24 NOTE — Telephone Encounter (Signed)
Called the patient left message on both cell and home number to call back

## 2014-07-24 NOTE — Telephone Encounter (Signed)
Pt not seen by me since may 2014; will have to deny tramadol  Please make ROV

## 2014-07-25 MED ORDER — TRAMADOL HCL 50 MG PO TABS: ORAL_TABLET | ORAL | Status: DC

## 2014-07-25 NOTE — Telephone Encounter (Signed)
The patient did call back and has scheduled an appointment for 07/31/14.  He would like a refill of his tramadol as has been out for 2 weeks.

## 2014-07-25 NOTE — Telephone Encounter (Signed)
[facility]  [place]

## 2014-07-31 ENCOUNTER — Ambulatory Visit (INDEPENDENT_AMBULATORY_CARE_PROVIDER_SITE_OTHER): Payer: BC Managed Care – PPO | Admitting: Internal Medicine

## 2014-07-31 ENCOUNTER — Encounter: Payer: Self-pay | Admitting: Internal Medicine

## 2014-07-31 ENCOUNTER — Other Ambulatory Visit (INDEPENDENT_AMBULATORY_CARE_PROVIDER_SITE_OTHER): Payer: BC Managed Care – PPO

## 2014-07-31 ENCOUNTER — Other Ambulatory Visit: Payer: Self-pay | Admitting: Internal Medicine

## 2014-07-31 VITALS — BP 130/76 | HR 98 | Temp 98.1°F | Wt 160.5 lb

## 2014-07-31 DIAGNOSIS — R972 Elevated prostate specific antigen [PSA]: Secondary | ICD-10-CM

## 2014-07-31 DIAGNOSIS — J309 Allergic rhinitis, unspecified: Secondary | ICD-10-CM

## 2014-07-31 DIAGNOSIS — Z0000 Encounter for general adult medical examination without abnormal findings: Secondary | ICD-10-CM

## 2014-07-31 DIAGNOSIS — Z91038 Other insect allergy status: Secondary | ICD-10-CM

## 2014-07-31 DIAGNOSIS — F1010 Alcohol abuse, uncomplicated: Secondary | ICD-10-CM

## 2014-07-31 DIAGNOSIS — Z91030 Bee allergy status: Secondary | ICD-10-CM

## 2014-07-31 LAB — PSA: PSA: 6.91 ng/mL — AB (ref 0.10–4.00)

## 2014-07-31 LAB — BASIC METABOLIC PANEL
BUN: 10 mg/dL (ref 6–23)
CO2: 27 mEq/L (ref 19–32)
Calcium: 9.6 mg/dL (ref 8.4–10.5)
Chloride: 104 mEq/L (ref 96–112)
Creatinine, Ser: 0.7 mg/dL (ref 0.4–1.5)
GFR: 136.61 mL/min (ref >60.00)
Glucose, Bld: 85 mg/dL (ref 70–99)
POTASSIUM: 4.5 meq/L (ref 3.5–5.1)
SODIUM: 140 meq/L (ref 135–145)

## 2014-07-31 LAB — LIPID PANEL
CHOL/HDL RATIO: 5
Cholesterol: 246 mg/dL — ABNORMAL HIGH (ref 0–200)
HDL: 45.4 mg/dL (ref >39.00)
LDL CALC: 172 mg/dL — AB (ref 0–99)
NONHDL: 200.6
TRIGLYCERIDES: 142 mg/dL (ref 0.0–149.0)
VLDL: 28.4 mg/dL (ref 0.0–40.0)

## 2014-07-31 LAB — CBC WITH DIFFERENTIAL/PLATELET
Basophils Absolute: 0 10*3/uL (ref 0.0–0.1)
Basophils Relative: 0.3 % (ref 0.0–3.0)
EOS PCT: 7 % — AB (ref 0.0–5.0)
Eosinophils Absolute: 0.5 10*3/uL (ref 0.0–0.7)
HCT: 46.4 % (ref 39.0–52.0)
Hemoglobin: 15.9 g/dL (ref 13.0–17.0)
LYMPHS ABS: 2.1 10*3/uL (ref 0.7–4.0)
Lymphocytes Relative: 27.8 % (ref 12.0–46.0)
MCHC: 34.4 g/dL (ref 30.0–36.0)
MCV: 100 fl (ref 78.0–100.0)
Monocytes Absolute: 0.5 10*3/uL (ref 0.1–1.0)
Monocytes Relative: 6.4 % (ref 3.0–12.0)
Neutro Abs: 4.4 10*3/uL (ref 1.4–7.7)
Neutrophils Relative %: 58.5 % (ref 43.0–77.0)
Platelets: 214 10*3/uL (ref 150.0–400.0)
RBC: 4.64 Mil/uL (ref 4.22–5.81)
RDW: 13.7 % (ref 11.5–15.5)
WBC: 7.5 10*3/uL (ref 4.0–10.5)

## 2014-07-31 LAB — HEPATIC FUNCTION PANEL
ALT: 52 U/L (ref 0–53)
AST: 59 U/L — AB (ref 0–37)
Albumin: 4.3 g/dL (ref 3.5–5.2)
Alkaline Phosphatase: 56 U/L (ref 39–117)
BILIRUBIN TOTAL: 0.8 mg/dL (ref 0.2–1.2)
Bilirubin, Direct: 0.1 mg/dL (ref 0.0–0.3)
Total Protein: 7.8 g/dL (ref 6.0–8.3)

## 2014-07-31 LAB — URINALYSIS, ROUTINE W REFLEX MICROSCOPIC
Hgb urine dipstick: NEGATIVE
Leukocytes, UA: NEGATIVE
Nitrite: NEGATIVE
PH: 6 (ref 5.0–8.0)
Specific Gravity, Urine: 1.025 (ref 1.000–1.030)
TOTAL PROTEIN, URINE-UPE24: NEGATIVE
URINE GLUCOSE: NEGATIVE
Urobilinogen, UA: 1 (ref 0.0–1.0)

## 2014-07-31 LAB — TSH: TSH: 1.32 u[IU]/mL (ref 0.35–4.50)

## 2014-07-31 MED ORDER — CETIRIZINE HCL 10 MG PO TABS: 10.0000 mg | ORAL_TABLET | Freq: Every day | ORAL | Status: DC

## 2014-07-31 MED ORDER — EPINEPHRINE 0.3 MG/0.3ML IJ SOAJ
0.3000 mg | Freq: Once | INTRAMUSCULAR | Status: DC
Start: 2014-07-31 — End: 2020-11-05

## 2014-07-31 MED ORDER — LOVASTATIN 20 MG PO TABS: 20.0000 mg | ORAL_TABLET | Freq: Every day | ORAL | Status: DC

## 2014-07-31 NOTE — Progress Notes (Signed)
Pre visit review using our clinic review tool, if applicable. No additional management support is needed unless otherwise documented below in the visit note. 

## 2014-07-31 NOTE — Progress Notes (Signed)
Subjective:    Patient ID: Richard Santos, male    DOB: [birth year], 53 y.o.   MRN: [id number]  HPI  Here for wellness and f/u;  Overall doing ok;  Pt denies CP, worsening SOB, DOE, wheezing, orthopnea, PND, worsening LE edema, palpitations, dizziness or syncope.  Pt denies neurological change such as new headache, facial or extremity weakness.  Pt denies polydipsia, polyuria, or low sugar symptoms. Pt states overall good compliance with treatment and medications, good tolerability, and has been trying to follow lower cholesterol diet.  Pt denies worsening depressive symptoms, suicidal ideation or panic. No fever, night sweats, wt loss, loss of appetite, or other constitutional symptoms.  Pt states good ability with ADL's, has low fall risk, home safety reviewed and adequate, no other significant changes in hearing or vision, and only occasionally active with exercise.  Declines flu shot. Last seen may 2014 with elev PSA, but has not been able to afford to see urology.  Still drinking 6 pk beer per day.  Does have several wks ongoing nasal allergy symptoms with clearish congestion, itch and sneezing, without fever, pain, ST, cough, swelling or wheezing.. Mentions out of statin for 2 wks Past Medical History  Diagnosis Date  . HLD (hyperlipidemia)   . Chronic LBP   . Lumbar disc disease   . Anxiety   . ED (erectile dysfunction)   . Allergic rhinitis   . Alcohol abuse   . GERD (gastroesophageal reflux disease)   . Functional dyspepsia   . ETHANOL ABUSE 12/31/2010    6 beers daily for years    . Impaired glucose tolerance 02/27/2012  . Finger injury     cut pads off 3rd and 4th finger left hand/due to lawn mower accident   Past Surgical History  Procedure Laterality Date  . Tonsillectomy    . Upper gastrointestinal endoscopy    . Colonoscopy      reports that he has been smoking Cigarettes.  He has a 25 pack-year smoking history. He has never used smokeless tobacco. He reports that he drinks  about 12 ounces of alcohol per week. He reports that he does not use illicit drugs. family history includes Breast cancer in his sister; Heart disease in his father, mother, and another family member; Prostate cancer in his father. There is no history of Colon cancer. No Known Allergies Current Outpatient Prescriptions on File Prior to Visit  Medication Sig Dispense Refill  . aspirin 81 MG tablet Take 81 mg by mouth daily.      . busPIRone (BUSPAR) 10 MG tablet Take 1 tablet (10 mg total) by mouth 2 (two) times daily.  180 tablet  3  . ibuprofen (ADVIL,MOTRIN) 200 MG tablet Take 200 mg by mouth every 6 (six) hours as needed for pain. Takes two daily      . pantoprazole (PROTONIX) 40 MG tablet TAKE ONE TABLET BY MOUTH 30 MINUTES BEFORE A MEAL.  90 tablet  3  . traMADol (ULTRAM) 50 MG tablet TAKE 1/2 TABLET BY MOUTH TWICE A DAY AS NEEDED FOR PAIN  30 tablet  0   No current facility-administered medications on file prior to visit.    Review of Systems Constitutional: Negative for increased diaphoresis, other activity, appetite or other siginficant weight change  HENT: Negative for worsening hearing loss, ear pain, facial swelling, mouth sores and neck stiffness.   Eyes: Negative for other worsening pain, redness or visual disturbance.  Respiratory: Negative for shortness of breath and wheezing.  Cardiovascular: Negative for chest pain and palpitations.  Gastrointestinal: Negative for diarrhea, blood in stool, abdominal distention or other pain Genitourinary: Negative for hematuria, flank pain or change in urine volume.  Musculoskeletal: Negative for myalgias or other joint complaints.  Skin: Negative for color change and wound.  Neurological: Negative for syncope and numbness. other than noted Hematological: Negative for adenopathy. or other swelling Psychiatric/Behavioral: Negative for hallucinations, self-injury, decreased concentration or other worsening agitation.      Objective:    Physical Exam BP 130/76  Pulse 98  Temp(Src) 98.1 F (36.7 C) (Oral)  Wt 160 lb 8 oz (72.802 kg)  SpO2 97% VS noted,  Constitutional: Pt is oriented to person, place, and time. Appears well-developed and well-nourished.  Head: Normocephalic and atraumatic.  Right Ear: External ear normal.  Left Ear: External ear normal.  Nose: Nose normal.  Mouth/Throat: Oropharynx is clear and moist.  Eyes: Conjunctivae and EOM are normal. Pupils are equal, round, and reactive to light.  Neck: Normal range of motion. Neck supple. No JVD present. No tracheal deviation present.  Cardiovascular: Normal rate, regular rhythm, normal heart sounds and intact distal pulses.   Pulmonary/Chest: Effort normal and breath sounds without rales or wheezing  Abdominal: Soft. Bowel sounds are normal. NT. No HSM  Musculoskeletal: Normal range of motion. Exhibits no edema.  Lymphadenopathy:  Has no cervical adenopathy.  Neurological: Pt is alert and oriented to person, place, and time. Pt has normal reflexes. No cranial nerve deficit. Motor grossly intact Skin: Skin is warm and dry. No rash noted.  Psychiatric:  Has normal mood and affect. Behavior is normal.     Assessment & Plan:

## 2014-07-31 NOTE — Assessment & Plan Note (Signed)
[place] for zyrtec prn,  to f/u any worsening symptoms or concerns

## 2014-07-31 NOTE — Patient Instructions (Signed)
Please take all new medication as prescribed - the zyrtec, as well as the epipen  Please continue all other medications as before, and refills have been done if requested.  Please have the pharmacy call with any other refills you may need.  Please continue your efforts at being more active, low cholesterol diet, and weight control.  You are otherwise up to date with prevention measures today.  Please keep your appointments with your specialists as you may have planned  Please stop drinking alcohol  Please go to the LAB in the Basement (turn left off the elevator) for the tests to be done today  You will be contacted by phone if any changes need to be made immediately.  Otherwise, you will receive a letter about your results with an explanation, but please check with MyChart first.  Please remember to sign up for MyChart if you have not done so, as this will be important to you in the future with finding out test results, communicating by private email, and scheduling acute appointments online when needed.  Please return in 1 year for your yearly visit, or sooner if needed, with Lab testing done 3-5 days before

## 2014-07-31 NOTE — Assessment & Plan Note (Signed)
[place] for epipen prn,  to f/u any worsening symptoms or concerns

## 2014-07-31 NOTE — Assessment & Plan Note (Signed)

## 2014-07-31 NOTE — Assessment & Plan Note (Signed)
Urged to abstain 

## 2014-07-31 NOTE — Assessment & Plan Note (Signed)
Due for f/u, for urology referral if persistently elevated

## 2014-09-21 ENCOUNTER — Other Ambulatory Visit: Payer: Self-pay | Admitting: Internal Medicine

## 2014-09-21 NOTE — Telephone Encounter (Signed)
MD out of office, please advise

## 2014-09-21 NOTE — Telephone Encounter (Signed)
Tramadol has been called to [facility] in [place]

## 2014-09-21 NOTE — Telephone Encounter (Signed)
30

## 2014-10-02 HISTORY — PX: PROSTATE BIOPSY: SHX241

## 2014-10-29 ENCOUNTER — Other Ambulatory Visit: Payer: Self-pay | Admitting: Internal Medicine

## 2014-11-28 ENCOUNTER — Encounter: Payer: Self-pay | Admitting: Radiation Oncology

## 2014-11-28 NOTE — Progress Notes (Signed)
GU Location of Tumor / Histology:Adenocarcinoma of the Prostate, Right and Left Base  If Prostate Cancer, Gleason Score is (3 + 3) and PSA is (6.91)  [name] was referred by Dr. Cathlean Cower to Dr. Rana Snare on 11/12/14  due to a rising PSA of 6.9.  Rectal exam with "revealed nothing of concern. 3/6 biopsies positive on the right and 1/6 positive on the left. Gleason score of 6.  Biopsies of Prostate (if applicable) revealed:     Past/Anticipated interventions by urology, if any: Dr. Rana Snare -Referred for Brachytherapy as patient desired  Past/Anticipated interventions by medical oncology, if any: No  Weight changes, if any:   Bowel/Bladder complaints, if any:  Voiding without difficulty, good stream, no dribbling, nocturia x 1  Nausea/Vomiting, if any: No  Pain issues, if any: No    SAFETY ISSUES:  Prior radiation? No  Pacemaker/ICD?No  Possible current pregnancy? N/A  Is the patient on methotrexate? 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

## 2014-11-29 ENCOUNTER — Ambulatory Visit
Admission: RE | Admit: 2014-11-29 | Discharge: 2014-11-29 | Disposition: A | Discharge status: Home / Self Care | Payer: BLUE CROSS/BLUE SHIELD | Source: Ambulatory Visit | Attending: Radiation Oncology | Admitting: Radiation Oncology

## 2014-11-29 ENCOUNTER — Encounter: Payer: Self-pay | Admitting: Radiation Oncology

## 2014-11-29 VITALS — BP 122/84 | HR 93 | Temp 98.2°F | Ht 72.0 in | Wt 158.4 lb

## 2014-11-29 DIAGNOSIS — C61 Malignant neoplasm of prostate: Secondary | ICD-10-CM | POA: Insufficient documentation

## 2014-11-29 HISTORY — DX: Malignant neoplasm of prostate: C61

## 2014-11-29 NOTE — Progress Notes (Addendum)
CC: Dr. Rana Snare   Complex simulation/treatment planning note: Richard Santos was taken to the CT simulator for his CT arch study.  His pelvis was scanned.  The CT data set was sent to the planning system where I contoured his prostate.  I obtained a prostate volume of 40.8 mL compared to 36 mL through Dr. Risa Grill.  This is a reasonable correlation.  The CT arch is open and there is no bony interference.  I prescribing 14,500 cGy utilizing I-125 seeds with the [employer] system.

## 2014-11-29 NOTE — Progress Notes (Signed)
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 male who is seen today through the courtesy of Dr. 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  He was recently seen by Dr. Risa Grill for a rising PSA over the last few years.  His PSA was 3.94 in April 2013, 5.71 in May 2014 and most recently 6.91 on 07/31/2014.  He was advised to see Dr. Risa Grill back in 2014, but he had financial issues.  He underwent ultrasound-guided biopsies on 10/02/2014.  He was found have Gleason 6 (3+3) involving less than 5% of one core from the right lateral base, 5% of one core from the right base, less than 5% of one core from the right apex 2 and 5% of one core from the left apex 2.  His prostate volume was approximately 36 mL.  He is doing well from a GU and GI standpoint.  His I PSS score is 1.  He is potent.  He did try Viagra on one occasion but he saw a "blue lights".  PREVIOUS RADIATION THERAPY: No   PAST MEDICAL HISTORY:  has a past medical history of HLD (hyperlipidemia); Chronic LBP; Lumbar disc disease; Anxiety; ED (erectile dysfunction); Allergic rhinitis; Alcohol abuse; GERD (gastroesophageal reflux disease); Functional dyspepsia; ETHANOL ABUSE (12/31/2010); Impaired glucose tolerance (02/27/2012); Finger injury; and Prostate cancer (10/02/14).     PAST SURGICAL HISTORY:  Past Surgical History  Procedure Laterality Date  . Tonsillectomy    . Upper gastrointestinal endoscopy    . Colonoscopy    . Prostate biopsy  10/02/14     FAMILY HISTORY: family history includes Breast cancer in his sister;  Heart disease in his father, mother, and another family member; Prostate cancer in his father. There is no history of Colon cancer. His father was diagnosed with prostate cancer in his 81s.  He is 98 and alive with cardiac issues.  His mother is alive at 64 with orthopedic and cardiac issues.   SOCIAL HISTORY:  reports that he has been smoking Cigarettes.  He has a 25 pack-year smoking history. He has never used smokeless tobacco. He reports that he drinks about 14.4 oz of alcohol per week. He reports that he does not use illicit drugs. married, 2 stepchildren.  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   ALLERGIES: Bee venom   MEDICATIONS:  Current Outpatient Prescriptions  Medication Sig Dispense Refill  . aspirin 81 MG tablet Take 81 mg by mouth daily.    . busPIRone (BUSPAR) 10 MG tablet Take 1 tablet (10 mg total) by mouth 2 (two) times daily. 180 tablet 3  . cetirizine (ZYRTEC) 10 MG tablet Take 1 tablet (10 mg total) by mouth daily. 30 tablet 11  . EPINEPHrine (EPIPEN) 0.3 mg/0.3 mL IJ SOAJ injection Inject 0.3 mLs (0.3 mg total) into the muscle once. 2 Device 2  . ibuprofen (ADVIL,MOTRIN) 200 MG tablet Take 200 mg by mouth every 6 (six) hours as needed for pain. Takes two daily    . lovastatin (MEVACOR)  20 MG tablet Take 1 tablet (20 mg total) by mouth at bedtime. 90 tablet 3  . pantoprazole (PROTONIX) 40 MG tablet TAKE ONE TABLET BY MOUTH ONCE DAILY 30 MINUTES BEFORE A MEAL 90 tablet 0  . traMADol (ULTRAM) 50 MG tablet TAKE 1/2 TABLET BY MOUTH TWICE A DAY AS NEEDED FOR PAIN 30 tablet 0   No current facility-administered medications for this encounter.     REVIEW OF SYSTEMS:  Pertinent items are noted in HPI.    PHYSICAL EXAM:  height is 6' (1.829 m) and weight is 158 lb 6.4 oz (71.85 kg). His temperature is 98.2 F (36.8 C). His blood pressure is 122/84 and his pulse is 93.   Alert and oriented 54 year old white male appearing his stated age.  Head and neck  examination: Grossly unremarkable.  Chest: Lungs clear.  Abdomen: Without masses organomegaly.  Genitalia: Unremarkable to inspection.  Rectal: Prostate gland is normal in size and is without focal induration or nodularity.  Extremities: Without edema.   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  I explained to the patient that his management options include surgery versus active surveillance versus radiation therapy.  Radiation therapy options include seed implantation or external beam/IMRT.  In view of his relatively young age, I do strongly recommend robotic prostatectomy.  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  Seed implantation is not unreasonable considering his social situation.  We discussed the potential acute and late toxicities of radiation therapy.  He was given literature for review.  We also discussed radiation safety issues.  He will have a CT arch today and we will get him scheduled for seed implantation in 4-6 weeks.   PLAN: As discussed above.  I spent 45 minutes face to face with the patient and more than 50% of that time was spent in counseling and/or coordination of care.

## 2014-11-29 NOTE — Progress Notes (Signed)
Please see the Nurse Progress Note in the MD Initial Consult Encounter for this patient. 

## 2014-12-03 ENCOUNTER — Telehealth: Payer: Self-pay | Admitting: *Deleted

## 2014-12-03 ENCOUNTER — Other Ambulatory Visit: Payer: Self-pay | Admitting: Urology

## 2014-12-03 NOTE — Telephone Encounter (Signed)
Called patient to inform of implant for 01-30-15, spoke with patient and he is aware of this implant.

## 2014-12-21 ENCOUNTER — Ambulatory Visit (HOSPITAL_BASED_OUTPATIENT_CLINIC_OR_DEPARTMENT_OTHER)
Admission: RE | Admit: 2014-12-21 | Discharge: 2014-12-21 | Disposition: A | Discharge status: Home / Self Care | Payer: BLUE CROSS/BLUE SHIELD | Source: Ambulatory Visit | Attending: Urology | Admitting: Urology

## 2014-12-21 ENCOUNTER — Other Ambulatory Visit: Payer: Self-pay

## 2014-12-21 ENCOUNTER — Encounter (HOSPITAL_BASED_OUTPATIENT_CLINIC_OR_DEPARTMENT_OTHER)
Admission: RE | Admit: 2014-12-21 | Discharge: 2014-12-21 | Disposition: A | Discharge status: Home / Self Care | Payer: BLUE CROSS/BLUE SHIELD | Source: Ambulatory Visit | Attending: Urology | Admitting: Urology

## 2014-12-21 DIAGNOSIS — Z01818 Encounter for other preprocedural examination: Secondary | ICD-10-CM | POA: Diagnosis not present

## 2014-12-21 DIAGNOSIS — C61 Malignant neoplasm of prostate: Secondary | ICD-10-CM | POA: Diagnosis not present

## 2015-01-16 ENCOUNTER — Other Ambulatory Visit: Payer: Self-pay | Admitting: Internal Medicine

## 2015-01-16 NOTE — Telephone Encounter (Signed)
Done hardcopy to Cherina  

## 2015-01-17 NOTE — Telephone Encounter (Signed)
Hard copy faxed to pharmacy

## 2015-01-22 ENCOUNTER — Telehealth: Payer: Self-pay | Admitting: *Deleted

## 2015-01-22 NOTE — Telephone Encounter (Signed)
Called patient to remind of lab appt. For 01-23-15, spoke with patient and he is aware of this appt.

## 2015-01-23 ENCOUNTER — Other Ambulatory Visit: Payer: Self-pay | Admitting: Internal Medicine

## 2015-01-23 DIAGNOSIS — Z9889 Other specified postprocedural states: Secondary | ICD-10-CM | POA: Diagnosis not present

## 2015-01-23 DIAGNOSIS — M545 Low back pain: Secondary | ICD-10-CM | POA: Diagnosis not present

## 2015-01-23 DIAGNOSIS — Z791 Long term (current) use of non-steroidal anti-inflammatories (NSAID): Secondary | ICD-10-CM | POA: Diagnosis not present

## 2015-01-23 DIAGNOSIS — G8929 Other chronic pain: Secondary | ICD-10-CM | POA: Diagnosis not present

## 2015-01-23 DIAGNOSIS — E785 Hyperlipidemia, unspecified: Secondary | ICD-10-CM | POA: Diagnosis not present

## 2015-01-23 DIAGNOSIS — K219 Gastro-esophageal reflux disease without esophagitis: Secondary | ICD-10-CM | POA: Diagnosis not present

## 2015-01-23 DIAGNOSIS — Z79891 Long term (current) use of opiate analgesic: Secondary | ICD-10-CM | POA: Diagnosis not present

## 2015-01-23 DIAGNOSIS — F419 Anxiety disorder, unspecified: Secondary | ICD-10-CM | POA: Diagnosis not present

## 2015-01-23 DIAGNOSIS — Z7982 Long term (current) use of aspirin: Secondary | ICD-10-CM | POA: Diagnosis not present

## 2015-01-23 DIAGNOSIS — Z8042 Family history of malignant neoplasm of prostate: Secondary | ICD-10-CM | POA: Diagnosis not present

## 2015-01-23 DIAGNOSIS — F17210 Nicotine dependence, cigarettes, uncomplicated: Secondary | ICD-10-CM | POA: Diagnosis not present

## 2015-01-23 DIAGNOSIS — C61 Malignant neoplasm of prostate: Secondary | ICD-10-CM | POA: Diagnosis not present

## 2015-01-23 DIAGNOSIS — Z79899 Other long term (current) drug therapy: Secondary | ICD-10-CM | POA: Diagnosis not present

## 2015-01-23 DIAGNOSIS — M1990 Unspecified osteoarthritis, unspecified site: Secondary | ICD-10-CM | POA: Diagnosis not present

## 2015-01-23 LAB — COMPREHENSIVE METABOLIC PANEL
ALT: 46 U/L (ref 0–53)
AST: 44 U/L — ABNORMAL HIGH (ref 0–37)
Albumin: 4.3 g/dL (ref 3.5–5.2)
Alkaline Phosphatase: 56 U/L (ref 39–117)
Anion gap: 11 (ref 5–15)
BUN: 12 mg/dL (ref 6–23)
CO2: 26 mmol/L (ref 19–32)
Calcium: 9.1 mg/dL (ref 8.4–10.5)
Chloride: 104 mmol/L (ref 96–112)
Creatinine, Ser: 0.57 mg/dL (ref 0.50–1.35)
GLUCOSE: 103 mg/dL — AB (ref 70–99)
Potassium: 4 mmol/L (ref 3.5–5.1)
SODIUM: 141 mmol/L (ref 135–145)
TOTAL PROTEIN: 7.6 g/dL (ref 6.0–8.3)
Total Bilirubin: 0.6 mg/dL (ref 0.3–1.2)

## 2015-01-23 LAB — PROTIME-INR
INR: 0.96 (ref 0.00–1.49)
Prothrombin Time: 12.9 seconds (ref 11.6–15.2)

## 2015-01-23 LAB — CBC
HCT: 43.8 % (ref 39.0–52.0)
Hemoglobin: 14.8 g/dL (ref 13.0–17.0)
MCH: 34.1 pg — AB (ref 26.0–34.0)
MCHC: 33.8 g/dL (ref 30.0–36.0)
MCV: 100.9 fL — ABNORMAL HIGH (ref 78.0–100.0)
PLATELETS: 184 10*3/uL (ref 150–400)
RBC: 4.34 MIL/uL (ref 4.22–5.81)
RDW: 13 % (ref 11.5–15.5)
WBC: 4.8 10*3/uL (ref 4.0–10.5)

## 2015-01-23 LAB — APTT: aPTT: 28 seconds (ref 24–37)

## 2015-01-25 ENCOUNTER — Encounter (HOSPITAL_BASED_OUTPATIENT_CLINIC_OR_DEPARTMENT_OTHER): Payer: Self-pay | Admitting: *Deleted

## 2015-01-25 NOTE — Progress Notes (Signed)
NPO AFTER MN. ARRIVE AT 0700. CURRENT LAB RESULTS, CXR, AND EKG IN CHART AND EPIC. WILL TAKE AM MEDS W/ SIPS OF WATER AND DO FLEET ENEMA.

## 2015-01-28 DIAGNOSIS — C61 Malignant neoplasm of prostate: Secondary | ICD-10-CM | POA: Diagnosis not present

## 2015-01-29 ENCOUNTER — Encounter: Payer: Self-pay | Admitting: Radiation Oncology

## 2015-01-29 NOTE — Progress Notes (Signed)
Spoke with the patient and he is aware of how he can pay his bill after his surgery. No bill with [place] now, but I did mail him application for asst for [facility] bill.

## 2015-01-30 ENCOUNTER — Ambulatory Visit (HOSPITAL_COMMUNITY): Payer: BLUE CROSS/BLUE SHIELD

## 2015-01-30 ENCOUNTER — Ambulatory Visit (HOSPITAL_BASED_OUTPATIENT_CLINIC_OR_DEPARTMENT_OTHER)
Admission: RE | Admit: 2015-01-30 | Discharge: 2015-01-30 | Disposition: A | Discharge status: Home / Self Care | Payer: BLUE CROSS/BLUE SHIELD | Source: Ambulatory Visit | Attending: Urology | Admitting: Urology

## 2015-01-30 ENCOUNTER — Encounter (HOSPITAL_BASED_OUTPATIENT_CLINIC_OR_DEPARTMENT_OTHER)
Admission: RE | Disposition: A | Discharge status: Home / Self Care | Payer: Self-pay | Source: Ambulatory Visit | Attending: Urology

## 2015-01-30 ENCOUNTER — Encounter: Payer: Self-pay | Admitting: Radiation Oncology

## 2015-01-30 ENCOUNTER — Ambulatory Visit (HOSPITAL_BASED_OUTPATIENT_CLINIC_OR_DEPARTMENT_OTHER): Payer: BLUE CROSS/BLUE SHIELD | Admitting: Anesthesiology

## 2015-01-30 ENCOUNTER — Encounter (HOSPITAL_BASED_OUTPATIENT_CLINIC_OR_DEPARTMENT_OTHER): Payer: Self-pay | Admitting: *Deleted

## 2015-01-30 DIAGNOSIS — E785 Hyperlipidemia, unspecified: Secondary | ICD-10-CM | POA: Insufficient documentation

## 2015-01-30 DIAGNOSIS — Z79891 Long term (current) use of opiate analgesic: Secondary | ICD-10-CM | POA: Insufficient documentation

## 2015-01-30 DIAGNOSIS — C61 Malignant neoplasm of prostate: Secondary | ICD-10-CM | POA: Diagnosis not present

## 2015-01-30 DIAGNOSIS — K219 Gastro-esophageal reflux disease without esophagitis: Secondary | ICD-10-CM | POA: Insufficient documentation

## 2015-01-30 DIAGNOSIS — Z791 Long term (current) use of non-steroidal anti-inflammatories (NSAID): Secondary | ICD-10-CM | POA: Insufficient documentation

## 2015-01-30 DIAGNOSIS — Z8042 Family history of malignant neoplasm of prostate: Secondary | ICD-10-CM | POA: Insufficient documentation

## 2015-01-30 DIAGNOSIS — M545 Low back pain: Secondary | ICD-10-CM | POA: Insufficient documentation

## 2015-01-30 DIAGNOSIS — Z79899 Other long term (current) drug therapy: Secondary | ICD-10-CM | POA: Insufficient documentation

## 2015-01-30 DIAGNOSIS — G8929 Other chronic pain: Secondary | ICD-10-CM | POA: Insufficient documentation

## 2015-01-30 DIAGNOSIS — F419 Anxiety disorder, unspecified: Secondary | ICD-10-CM | POA: Insufficient documentation

## 2015-01-30 DIAGNOSIS — M1990 Unspecified osteoarthritis, unspecified site: Secondary | ICD-10-CM | POA: Insufficient documentation

## 2015-01-30 DIAGNOSIS — Z7982 Long term (current) use of aspirin: Secondary | ICD-10-CM | POA: Insufficient documentation

## 2015-01-30 DIAGNOSIS — Z9889 Other specified postprocedural states: Secondary | ICD-10-CM | POA: Insufficient documentation

## 2015-01-30 DIAGNOSIS — F17210 Nicotine dependence, cigarettes, uncomplicated: Secondary | ICD-10-CM | POA: Insufficient documentation

## 2015-01-30 HISTORY — DX: Other intervertebral disc degeneration, lumbar region: M51.36

## 2015-01-30 HISTORY — DX: Other chronic pain: M54.50

## 2015-01-30 HISTORY — DX: Low back pain: M54.5

## 2015-01-30 HISTORY — DX: Hyperlipidemia, unspecified: E78.5

## 2015-01-30 HISTORY — DX: Unspecified right bundle-branch block: I45.10

## 2015-01-30 HISTORY — DX: Other intervertebral disc degeneration, lumbar region without mention of lumbar back pain or lower extremity pain: M51.369

## 2015-01-30 HISTORY — DX: Diverticulosis of large intestine without perforation or abscess without bleeding: K57.30

## 2015-01-30 HISTORY — DX: Presence of spectacles and contact lenses: Z97.3

## 2015-01-30 HISTORY — DX: Other chronic pain: G89.29

## 2015-01-30 HISTORY — PX: RADIOACTIVE SEED IMPLANT: SHX5150

## 2015-01-30 SURGERY — INSERTION, RADIATION SOURCE, PROSTATE
Anesthesia: General | Site: Prostate

## 2015-01-30 MED ORDER — GLYCOPYRROLATE 0.2 MG/ML IJ SOLN
INTRAMUSCULAR | Status: DC | PRN
  Administered 2015-01-30: 0.6 mg via INTRAVENOUS

## 2015-01-30 MED ORDER — FLEET ENEMA 7-19 GM/118ML RE ENEM
1.0000 | ENEMA | Freq: Once | RECTAL | Status: DC
  Filled 2015-01-30: qty 1

## 2015-01-30 MED ORDER — ONDANSETRON HCL 4 MG/2ML IJ SOLN
INTRAMUSCULAR | Status: DC | PRN
  Administered 2015-01-30: 4 mg via INTRAVENOUS

## 2015-01-30 MED ORDER — MEPERIDINE HCL 25 MG/ML IJ SOLN
6.2500 mg | INTRAMUSCULAR | Status: DC | PRN
  Filled 2015-01-30: qty 1

## 2015-01-30 MED ORDER — LACTATED RINGERS IV SOLN
INTRAVENOUS | Status: DC
  Filled 2015-01-30: qty 1000

## 2015-01-30 MED ORDER — PROPOFOL 10 MG/ML IV BOLUS
INTRAVENOUS | Status: DC | PRN
  Administered 2015-01-30: 30 mg via INTRAVENOUS
  Administered 2015-01-30: 200 mg via INTRAVENOUS
  Administered 2015-01-30: 20 mg via INTRAVENOUS

## 2015-01-30 MED ORDER — NEOSTIGMINE METHYLSULFATE 10 MG/10ML IV SOLN
INTRAVENOUS | Status: DC | PRN
  Administered 2015-01-30: 4 mg via INTRAVENOUS

## 2015-01-30 MED ORDER — LIDOCAINE HCL (CARDIAC) 20 MG/ML IV SOLN
INTRAVENOUS | Status: DC | PRN
  Administered 2015-01-30: 60 mg via INTRAVENOUS
  Administered 2015-01-30: 40 mg via INTRAVENOUS

## 2015-01-30 MED ORDER — HYDROCODONE-ACETAMINOPHEN 5-325 MG PO TABS: 1.0000 | ORAL_TABLET | Freq: Four times a day (QID) | ORAL | Status: DC | PRN

## 2015-01-30 MED ORDER — DEXAMETHASONE SODIUM PHOSPHATE 4 MG/ML IJ SOLN
INTRAMUSCULAR | Status: DC | PRN
  Administered 2015-01-30: 10 mg via INTRAVENOUS

## 2015-01-30 MED ORDER — ROCURONIUM BROMIDE 100 MG/10ML IV SOLN
INTRAVENOUS | Status: DC | PRN
  Administered 2015-01-30: 40 mg via INTRAVENOUS
  Administered 2015-01-30: 10 mg via INTRAVENOUS

## 2015-01-30 MED ORDER — HYDROCODONE-ACETAMINOPHEN 5-325 MG PO TABS
1.0000 | ORAL_TABLET | Freq: Four times a day (QID) | ORAL | Status: DC | PRN
  Administered 2015-01-30: 1 via ORAL
  Filled 2015-01-30: qty 2

## 2015-01-30 MED ORDER — CIPROFLOXACIN IN D5W 400 MG/200ML IV SOLN
INTRAVENOUS | Status: AC
  Filled 2015-01-30: qty 200

## 2015-01-30 MED ORDER — SUCCINYLCHOLINE CHLORIDE 20 MG/ML IJ SOLN
INTRAMUSCULAR | Status: DC | PRN
  Administered 2015-01-30: 100 mg via INTRAVENOUS

## 2015-01-30 MED ORDER — FENTANYL CITRATE 0.05 MG/ML IJ SOLN
INTRAMUSCULAR | Status: AC
  Filled 2015-01-30: qty 2

## 2015-01-30 MED ORDER — ACETAMINOPHEN 10 MG/ML IV SOLN
INTRAVENOUS | Status: DC | PRN
  Administered 2015-01-30: 1000 mg via INTRAVENOUS

## 2015-01-30 MED ORDER — MIDAZOLAM HCL 2 MG/2ML IJ SOLN
INTRAMUSCULAR | Status: AC
  Filled 2015-01-30: qty 2

## 2015-01-30 MED ORDER — EPHEDRINE SULFATE 50 MG/ML IJ SOLN
INTRAMUSCULAR | Status: DC | PRN
  Administered 2015-01-30 (×5): 10 mg via INTRAVENOUS

## 2015-01-30 MED ORDER — LIDOCAINE HCL 2 % EX GEL
CUTANEOUS | Status: DC | PRN
  Administered 2015-01-30: 1 via URETHRAL

## 2015-01-30 MED ORDER — HYDROCODONE-ACETAMINOPHEN 5-325 MG PO TABS
ORAL_TABLET | ORAL | Status: AC
  Filled 2015-01-30: qty 1

## 2015-01-30 MED ORDER — CIPROFLOXACIN HCL 500 MG PO TABS: 500.0000 mg | ORAL_TABLET | Freq: Two times a day (BID) | ORAL | Status: DC

## 2015-01-30 MED ORDER — IOHEXOL 350 MG/ML SOLN
INTRAVENOUS | Status: DC | PRN
  Administered 2015-01-30: 7 mL

## 2015-01-30 MED ORDER — BELLADONNA ALKALOIDS-OPIUM 16.2-60 MG RE SUPP
RECTAL | Status: DC | PRN
  Administered 2015-01-30: 1 via RECTAL

## 2015-01-30 MED ORDER — FENTANYL CITRATE 0.05 MG/ML IJ SOLN
INTRAMUSCULAR | Status: AC
  Filled 2015-01-30: qty 6

## 2015-01-30 MED ORDER — BELLADONNA ALKALOIDS-OPIUM 16.2-60 MG RE SUPP
RECTAL | Status: AC
  Filled 2015-01-30: qty 1

## 2015-01-30 MED ORDER — LIDOCAINE HCL 4 % MT SOLN
OROMUCOSAL | Status: DC | PRN
  Administered 2015-01-30: 4 mL via TOPICAL

## 2015-01-30 MED ORDER — LACTATED RINGERS IV SOLN
INTRAVENOUS | Status: DC
  Administered 2015-01-30 (×3): via INTRAVENOUS
  Filled 2015-01-30: qty 1000

## 2015-01-30 MED ORDER — MIDAZOLAM HCL 5 MG/5ML IJ SOLN
INTRAMUSCULAR | Status: DC | PRN
  Administered 2015-01-30: 2 mg via INTRAVENOUS

## 2015-01-30 MED ORDER — STERILE WATER FOR IRRIGATION IR SOLN
Status: DC | PRN
  Administered 2015-01-30: 3000 mL

## 2015-01-30 MED ORDER — FENTANYL CITRATE 0.05 MG/ML IJ SOLN
INTRAMUSCULAR | Status: DC | PRN
  Administered 2015-01-30: 25 ug via INTRAVENOUS
  Administered 2015-01-30 (×2): 50 ug via INTRAVENOUS
  Administered 2015-01-30: 25 ug via INTRAVENOUS
  Administered 2015-01-30: 50 ug via INTRAVENOUS

## 2015-01-30 MED ORDER — CIPROFLOXACIN IN D5W 400 MG/200ML IV SOLN
400.0000 mg | INTRAVENOUS | Status: AC
  Administered 2015-01-30: 400 mg via INTRAVENOUS
  Filled 2015-01-30: qty 200

## 2015-01-30 MED ORDER — PROMETHAZINE HCL 25 MG/ML IJ SOLN
6.2500 mg | INTRAMUSCULAR | Status: DC | PRN
  Filled 2015-01-30: qty 1

## 2015-01-30 MED ORDER — FENTANYL CITRATE 0.05 MG/ML IJ SOLN
25.0000 ug | INTRAMUSCULAR | Status: DC | PRN
  Administered 2015-01-30: 25 ug via INTRAVENOUS
  Filled 2015-01-30: qty 1

## 2015-01-30 MED ORDER — KETOROLAC TROMETHAMINE 30 MG/ML IJ SOLN
INTRAMUSCULAR | Status: DC | PRN
  Administered 2015-01-30: 30 mg via INTRAVENOUS

## 2015-01-30 SURGICAL SUPPLY — 26 items
BAG URINE DRAINAGE (UROLOGICAL SUPPLIES) ×2 IMPLANT
BLADE CLIPPER SURG (BLADE) ×2 IMPLANT
CATH FOLEY 2WAY SLVR  5CC 16FR (CATHETERS) ×2
CATH FOLEY 2WAY SLVR 5CC 16FR (CATHETERS) ×2 IMPLANT
CATH ROBINSON RED A/P 20FR (CATHETERS) ×2 IMPLANT
CLOTH BEACON ORANGE TIMEOUT ST (SAFETY) ×2 IMPLANT
COVER MAYO STAND STRL (DRAPES) ×2 IMPLANT
COVER TABLE BACK 60X90 (DRAPES) ×2 IMPLANT
DRSG TEGADERM 4X4.75 (GAUZE/BANDAGES/DRESSINGS) ×2 IMPLANT
DRSG TEGADERM 8X12 (GAUZE/BANDAGES/DRESSINGS) ×2 IMPLANT
GLOVE BIO SURGEON STRL SZ 6.5 (GLOVE) ×1 IMPLANT
GLOVE BIO SURGEON STRL SZ7.5 (GLOVE) ×6 IMPLANT
GLOVE ECLIPSE 8.0 STRL XLNG CF (GLOVE) IMPLANT
GLOVE INDICATOR 6.5 STRL GRN (GLOVE) ×2 IMPLANT
GOWN STRL REIN XL XLG (GOWN DISPOSABLE) ×1 IMPLANT
GOWN STRL REUS W/ TWL LRG LVL3 (GOWN DISPOSABLE) IMPLANT
GOWN STRL REUS W/ TWL XL LVL3 (GOWN DISPOSABLE) IMPLANT
GOWN STRL REUS W/TWL LRG LVL3 (GOWN DISPOSABLE) ×2
GOWN STRL REUS W/TWL XL LVL3 (GOWN DISPOSABLE) ×2
HOLDER FOLEY CATH W/STRAP (MISCELLANEOUS) ×2 IMPLANT
PACK CYSTO (CUSTOM PROCEDURE TRAY) ×2 IMPLANT
Radioactive Seed ×79 IMPLANT
SPONGE GAUZE 4X4 12PLY STER LF (GAUZE/BANDAGES/DRESSINGS) ×1 IMPLANT
SYRINGE 10CC LL (SYRINGE) ×2 IMPLANT
UNDERPAD 30X30 INCONTINENT (UNDERPADS AND DIAPERS) ×4 IMPLANT
WATER STERILE IRR 500ML POUR (IV SOLUTION) ×2 IMPLANT

## 2015-01-30 NOTE — Progress Notes (Signed)
[facility] Brachytherapy Operative Procedure Note  Name: Richard Santos MRN: [id number]  Date:   12/03/2014           DOB: 1961/07/13  Status:outpatient    OF:[facility] Jenny Reichmann, MD  Dr. Rana Snare DIAGNOSIS: A 54 year old gentlemen with stage T1c adenocarcinoma of the prostate with a Gleason of 6 and a PSA of 6.91.  PROCEDURE: Insertion of radioactive I-125 seeds into the prostate gland.  RADIATION DOSE: 145 Gy, definitive therapy.  TECHNIQUE: [name] was brought to the operating room with Dr. Risa Grill. He was placed in the dorsolithotomy position. He was catheterized and a rectal tube was inserted. The perineum was shaved, prepped and draped. The ultrasound probe was then introduced into the rectum to see the prostate gland.  TREATMENT DEVICE: A needle grid was attached to the ultrasound probe stand and anchor needles were placed.  COMPLEX ISODOSE CALCULATION: The prostate was imaged in 3D using a sagittal sweep of the prostate probe. These images were transferred to the planning computer. There, the prostate, urethra and rectum were defined on each axial reconstructed image. Then, the software created an optimized plan and a few seed positions were adjusted. Then the accepted plan was uploaded to the seed Selectron afterloading unit.  SPECIAL TREATMENT PROCEDURE/SUPERVISION AND HANDLING: The Nucletron FIRST system was used to place the needles under sagittal guidance. A total of 29 needles were used to deposit 79 seeds in the prostate gland. The individual seed activity was 0.47 mCi for a total implant activity of 37.13 mCi.  COMPLEX SIMULATION: At the end of the procedure, an anterior radiograph of the pelvis was obtained to document seed positioning and count. Cystoscopy was performed to check the urethra and bladder.  MICRODOSIMETRY: At the end of the procedure, the patient was emitting 0.09 mrem/hr at 1 meter. Accordingly, he was considered safe for  hospital discharge.  PLAN: The patient will return to the radiation oncology clinic for post implant CT dosimetry in three weeks.

## 2015-01-30 NOTE — H&P (Signed)
Reason for visit  Richard Santos presents today for prostate seed implantation for management of his low risk adenocarcinoma of the prostate.   History of Present Illness   Richard Santos was referred to our office from Dr Cathlean Cower for a rising PSA. His most recent PSA was 6.9. Rectal exam revealed nothing of concern. At the time of ultrasound, prostate was felt to be 36 grams. There were no obvious worrisome areas on ultrasound and the prostate perimeter appeared intact and symmetric. Biopsies unfortunately were positive. On the right side 3/6 biopsies were positive. Fortunately, all of the positive biopsies were Gleason 3+3=6 cancer and the volume on each positive biopsy was relatively low at less than 5% to 5%. On the left side, only 1/6 biopsies were positive, also for a low-volume Gleason 6 cancer. Tumor was concentrated at both the apex and the right base. He is felt to have a favorable risk clinical stage T1c prostate cancer. Again, he is 54 years of age and enjoys good health.    IPSS 1/0  SHIM 15/25   Past Medical History Problems  1. History of arthritis (Z87.39) 2. History of heartburn (Z87.898) 3. History of hyperlipidemia (Z86.39)  Surgical History Problems  1. Denied: History Of Prior Surgery  Current Meds 1. Aspirin 81 MG Oral Tablet;  Therapy: (Recorded:12Oct2015) to Recorded 2. BusPIRone HCl - 10 MG Oral Tablet;  Therapy: (Recorded:12Oct2015) to Recorded 3. Cetirizine HCl - 10 MG Oral Tablet;  Therapy: (Recorded:12Oct2015) to Recorded 4. EPINEPHrine 0.3 MG/0.3ML DEVI;  Therapy: (Recorded:12Oct2015) to Recorded 5. Ibuprofen 200 MG Oral Tablet;  Therapy: (Recorded:12Oct2015) to Recorded 6. Lovastatin 20 MG Oral Tablet;  Therapy: (Recorded:12Oct2015) to Recorded 7. Pantoprazole Sodium 40 MG Oral Tablet Delayed Release;  Therapy: (Recorded:12Oct2015) to Recorded 8. TraMADol HCl - 50 MG Oral Tablet;  Therapy: (Recorded:12Oct2015) to Recorded  Allergies Medication  1.  No Known Drug Allergies Non-Medication  2. Bee sting  Family History Problems  1. No pertinent family history : Mother, Father  Social History Problems  1. Alcohol use (F10.99)   4 qd 2. Caffeine use (F15.90)   1 qd 3. Current every day smoker (F17.200) 4. Father's age   103yrs 5. Married 6. Mother's age   7yrs 7. Occupation   Production mgr 8. Two children  Review of Systems Genitourinary, constitutional, skin, eye, otolaryngeal, hematologic/lymphatic, cardiovascular, pulmonary, endocrine, musculoskeletal, gastrointestinal, neurological and psychiatric system(s) were reviewed and pertinent findings if present are noted and are otherwise negative.  Genitourinary: nocturia and hematuria.  Gastrointestinal: heartburn.  Musculoskeletal: back pain.    Vitals Vital Signs [Data Includes: Last 1 Day]  Blood Pressure: 148 / 87 Temperature: 97.9 F Heart Rate: 91  Physical Exam Constitutional: Well nourished and well developed . No acute distress.  ENT:. The ears and nose are normal in appearance.  Neck: The appearance of the neck is normal and no neck mass is present.  Pulmonary: No respiratory distress and normal respiratory rhythm and effort.  Cardiovascular: Heart rate and rhythm are normal . No peripheral edema.  Abdomen: The abdomen is soft and nontender. No masses are palpated. No CVA tenderness. No hernias are palpable. No hepatosplenomegaly noted.  Rectal: Rectal exam demonstrates normal sphincter tone, no tenderness and no masses. Estimated prostate size is 1+. The prostate has no nodularity and is not tender. The left seminal vesicle is nonpalpable. The right seminal vesicle is nonpalpable. The perineum is normal on inspection.  Genitourinary: Examination of the penis demonstrates no discharge, no masses, no lesions  and a normal meatus. The penis is circumcised. The scrotum is without lesions. The right epididymis is palpably normal and non-tender. The left  epididymis is palpably normal and non-tender. The right testis is non-tender and without masses. The left testis is non-tender and without masses.  Lymphatics: The femoral and inguinal nodes are not enlarged or tender.  Skin: Normal skin turgor, no visible rash and no visible skin lesions.  Neuro/Psych:. Mood and affect are appropriate.   Assessment Assessed  1. Adenocarcinoma of prostate (C61)  Plan Adenocarcinoma of prostate  1. Radiation Oncology Referral Referral  Referral  Status: Hold For - Appointment,Records   Requested for: 21Dec2015  Discussion/Summary           The patient was counseled about the natural history of prostate cancer and the standard treatment options that are available for prostate cancer. It was explained to him how his age and life expectancy, clinical stage, Gleason score, and PSA affect his prognosis, the decision to proceed with additional staging studies, as well as how that information influences recommended treatment strategies. We discussed the roles for active surveillance, radiation therapy, surgical therapy, androgen deprivation, as well as ablative therapy options for the treatment of prostate cancer as appropriate to his individual cancer situation. We discussed the risks and benefits of these options with regard to their impact on cancer control and also in terms of potential adverse events, complications, and impact on quiality of life particularly related to urinary, bowel, and sexual function. The patient was encouraged to ask questions throughout the discussion today and all questions were answered to his stated satisfaction. In addition, the patient was provided with and/or directed to appropriate resources and literature for further education about prostate cancer and treatment options.   We discussed surgical therapy for prostate cancer including the different available surgical approaches. We discussed, in detail, the risks and expectations of  surgery with regard to cancer control, urinary control, and erectile function as well as the expected postoperative recovery process. The risks, potential complications/adverse events of radical prostatectomy as well as alternative options were explained to the patient.   We discussed surgical therapy for prostate cancer including the different available surgical approaches. We discussed, in detail, the risks and expectations of surgery with regard to cancer control, urinary control, and erectile function as well as the expected postoperative recovery process. Additional risks of surgery including but not limited to bleeding, infection, hernia formation, nerve damage, lymphocele formation, bowel/rectal injury potentially necessitating colostomy, damage to the urinary tract resulting in urine leakage, urethral stricture, and the cardiopulmonary risks such as myocardial infarction, stroke, death, venothromboembolism, etc. were explained. The risk of open surgical conversion for robotic/laparoscopic prostatectomy was also discussed.   30 minutes were spent in face to face consultation with patient today.    Amendment  Richard Santos has a favorable risk clinical stage T1c adenocarcinoma of the prostate. He is 54 years of age and enjoys good health. I would recommend active treatment rather than active surveillance, given his multifocal disease. For a variety of reasons, he is most interested in brachytherapy. From a cancer standpoint, his situation is fairly ideally suited for brachytherapy. He has a relatively small prostate of approximately 36 grams. He has in essence no significant voiding symptoms with an IPSS score of 1 and a bothersome score of 0. He does have 4 positive cores, but each shows very low-volume Gleason 6 disease. There is a small amount of tumor at the base, but not a large volume  disease. The only hesitation, of course, is his age. He understands that there is potentially a risk of secondary  malignancy. That is the only hesitation we have in recommending brachytherapy for our youngest patient cohorts. On the other hand, he clearly is not as interested in a surgical approach. This is for a variety of reasons. This includes a reluctance with regard to the potential for incontinence, as well as a greater impact on sexual functioning. His job also will require 6-8 weeks out, and he feels that would be financially burdensome. He is very interested in discussing, again, the role of brachytherapy. We did discuss that procedure at length. I will send him to see Dr Arloa Koh to discuss the pros and cons in more detail and to determine, if indeed, he would like to proceed with that treatment option.

## 2015-01-30 NOTE — Progress Notes (Signed)
CC: Dr. Cathlean Cower, Dr. Rana Snare  End of treatment summary:  Intent: Curative  Requesting physician/Urologist: Dr. Rana Snare  Site/dose: Prostate 14,500 cGy  Isotope: I-125 utilizing 79 seeds and 29 active needles.  Individual seed activity 0.47 mCi per seed for a total implant activity of 37.13 mCi.  Narrative: Richard Santos appears to have undergone a successful Nucletron seed Selectron implant with Dr. Risa Grill.  Plan: Follow-up visit with Dr. Risa Grill myself in approximately 3 weeks.  At that time we'll obtain a CT scan for his post implant dosimetry to assess the quality of his implant.

## 2015-01-30 NOTE — Anesthesia Preprocedure Evaluation (Addendum)
Anesthesia Evaluation  Patient identified by MRN, date of birth, ID band Patient awake    Reviewed: Allergy & Precautions, NPO status , Patient's Chart, lab work & pertinent test results  Airway Mallampati: II  TM Distance: >3 FB Neck ROM: Full    Dental no notable dental hx.    Pulmonary neg pulmonary ROS, Current Smoker,  breath sounds clear to auscultation  Pulmonary exam normal       Cardiovascular negative cardio ROS  Rhythm:Regular Rate:Normal     Neuro/Psych negative neurological ROS  negative psych ROS   GI/Hepatic negative GI ROS, Neg liver ROS,   Endo/Other  negative endocrine ROS  Renal/GU negative Renal ROS  negative genitourinary   Musculoskeletal negative musculoskeletal ROS (+)   Abdominal   Peds negative pediatric ROS (+)  Hematology negative hematology ROS (+)   Anesthesia Other Findings   Reproductive/Obstetrics negative OB ROS                            Anesthesia Physical Anesthesia Plan  ASA: II  Anesthesia Plan: General   Post-op Pain Management:    Induction: Intravenous  Airway Management Planned: Oral ETT  Additional Equipment:   Intra-op Plan:   Post-operative Plan: Extubation in OR  Informed Consent: I have reviewed the patients History and Physical, chart, labs and discussed the procedure including the risks, benefits and alternatives for the proposed anesthesia with the patient or authorized representative who has indicated his/her understanding and acceptance.   Dental advisory given  Plan Discussed with: CRNA  Anesthesia Plan Comments:         Anesthesia Quick Evaluation

## 2015-01-30 NOTE — Discharge Instructions (Addendum)
DISCHARGE INSTRUCTIONS FOR PROSTATE SEED IMPLANTATION  Removal of catheter Remove the foley catheter after 24 hours ( day after the procedure).can be done easily by cutting the side port of the catheter, whichallow the balloon to deflate.  You will see 1-2 teaspoons of clear water as the balloon deflates and then the catheter can be slid out without difficulty.        Cut here  Antibiotics You may be given a prescription for an antibiotic to take when you arrive home. If so, be sure to take every tablet in the bottle, even if you are feeling better before the prescription is finished. If you begin itching, notice a rash or start to swell on your trunk, arms, legs and/or throat, immediately stop taking the antibiotic and call your Urologist. Diet Resume your usual diet when you return home. To keep your bowels moving easily and softly, drink prune, apple and cranberry juice at room temperature. You may also take a stool softener, such as Colace, which is available without prescription at local pharmacies. Daily activities ? No driving or heavy lifting for at least two days after the implant. ? No bike riding, horseback riding or riding lawn mowers for the first month after the implant. ? Any strenuous physical activity should be approved by your doctor before you resume it. Sexual relations You may resume sexual relations two weeks after the procedure. A condom should be used for the first two weeks. Your semen may be dark brown or black; this is normal and is related bleeding that may have occurred during the implant. Postoperative swelling Expect swelling and bruising of the scrotum and perineum (the area between the scrotum and anus). Both the swelling and the bruising should resolve in l or 2 weeks. Ice packs and over- the-counter medications such as Tylenol, Advil or Aleve may lessen your discomfort. Postoperative urination Most men experience burning on urination and/or urinary frequency.  If this becomes bothersome, contact your Urologist.  Medication can be prescribed to relieve these problems.  It is normal to have some blood in your urine for a few days after the implant. Special instructions related to the seeds It is unlikely that you will pass an Iodine-125 seed in your urine. The seeds are silver in color and are about as large as a grain of rice. If you pass a seed, do not handle it with your fingers. Use a spoon to place it in an envelope or jar in place this in base occluded area such as the garage or basement for return to the radiation clinic at your convenience.  Contact your doctor for ? Temperature greater than 101 F ? Increasing pain ? Inability to urinate Follow-up  You should have follow up with your urologist and radiation oncologist about 3 weeks after the procedure. General information regarding Iodine seeds ? Iodine-125 is a low energy radioactive material. It is not deeply penetrating and loses energy at short distances. Your prostate will absorb the radiation. Objects that are touched or used by the patient do not become radioactive. ? Body wastes (urine and stool) or body fluids (saliva, tears, semen or blood) are not radioactive. ? The Nuclear Regulatory Commission [facility]) has determined that no radiation precautions are needed for patients undergoing Iodine-125 seed implantation. The [facility] states that such patients do not present a risk to the people around them, including young children and pregnant women. However, in keeping with the general principle that radiation exposure should be kept as low reasonably  possible, we suggest the following: ? Children and pets should not sit on the patient's lap for the first two (2) weeks after the implant. ? Pregnant (or possibly pregnant) women should avoid prolonged, close contact with the patient for the first two (2) weeks after the implant. ? A distance of three (3) feet is acceptable. ? At a distance of three (3)  feet, there is no limit to the length of time anyone can be with the patient. ?  ? May resume aspirin in 3 days   Post Anesthesia Home Care Instructions  Activity: Get plenty of rest for the remainder of the day. A responsible adult should stay with you for 24 hours following the procedure.  For the next 24 hours, DO NOT: -Drive a car -Paediatric nurse -Drink alcoholic beverages -Take any medication unless instructed by your physician -Make any legal decisions or sign important papers.  Meals: Start with liquid foods such as gelatin or soup. Progress to regular foods as tolerated. Avoid greasy, spicy, heavy foods. If nausea and/or vomiting occur, drink only clear liquids until the nausea and/or vomiting subsides. Call your physician if vomiting continues.  Special Instructions/Symptoms: Your throat may feel dry or sore from the anesthesia or the breathing tube placed in your throat during surgery. If this causes discomfort, gargle with warm salt water. The discomfort should disappear within 24 hours.

## 2015-01-30 NOTE — H&P (Signed)
Urology Admission H&P  Chief Complaint: Prostate cancer  History of Present Illness: Mr Wissner was referred to our office from Dr Cathlean Cower for a rising PSA. His most recent PSA was 6.9. Rectal exam revealed nothing of concern. At the time of ultrasound, prostate was felt to be 36 grams. There were no obvious worrisome areas on ultrasound and the prostate perimeter appeared intact and symmetric. Biopsies unfortunately were positive. On the right side 3/6 biopsies were positive. Fortunately, all of the positive biopsies were Gleason 3+3=6 cancer and the volume on each positive biopsy was relatively low at less than 5% to 5%. On the left side, only 1/6 biopsies were positive, also for a low-volume Gleason 6 cancer. Tumor was concentrated at both the apex and the right base. He is felt to have a favorable risk clinical stage T1c prostate cancer. Again, he is 54 years of age and enjoys good health.  No interval changes in his health or no concerning signs for symptoms.   Past Medical History  Diagnosis Date  . Anxiety   . ED (erectile dysfunction)   . Allergic rhinitis   . GERD (gastroesophageal reflux disease)   . Finger injury     cut pads off 3rd and 4th finger left hand/due to lawn mower accident  . Hyperlipidemia   . Chronic low back pain   . Prostate cancer dx 10/02/14    stage T1c  . Sigmoid diverticulosis   . Incomplete right bundle branch block   . DDD (degenerative disc disease), lumbar   . Wears glasses    Past Surgical History  Procedure Laterality Date  . Prostate biopsy  10/02/14  . Esophagogastroduodenoscopy  08-05-2010  . Colonoscopy w/ polypectomy  04-19-2012    Home Medications:  Prescriptions prior to admission  Medication Sig Dispense Refill Last Dose  . aspirin 81 MG tablet Take 81 mg by mouth daily.   01/24/2015 at Unknown time  . busPIRone (BUSPAR) 10 MG tablet Take 1 tablet (10 mg total) by mouth 2 (two) times daily. 180 tablet 3 01/30/2015 at 0530  . cetirizine  (ZYRTEC) 10 MG tablet Take 1 tablet (10 mg total) by mouth daily. 30 tablet 11 Past Month at Unknown time  . EPINEPHrine (EPIPEN) 0.3 mg/0.3 mL IJ SOAJ injection Inject 0.3 mLs (0.3 mg total) into the muscle once. 2 Device 2 Taking  . ibuprofen (ADVIL,MOTRIN) 200 MG tablet Take 200 mg by mouth every 6 (six) hours as needed for pain. Takes two daily   Past Week at Unknown time  . lovastatin (MEVACOR) 20 MG tablet Take 1 tablet (20 mg total) by mouth at bedtime. 90 tablet 3 01/29/2015 at Unknown time  . pantoprazole (PROTONIX) 40 MG tablet TAKE ONE TABLET BY MOUTH ONCE DAILY 30  MINUTES  BEFORE  A  MEAL (Patient taking differently: TAKE ONE TABLET BY MOUTH ONCE DAILY 30  MINUTES  BEFORE  A  MEAL--- takes in am) 90 tablet 0 01/30/2015 at 0530  . traMADol (ULTRAM) 50 MG tablet TAKE 1/2 TABLET BY MOUTH TWICE A DAY AS NEEDED FOR PAIN 30 tablet 1 01/29/2015 at Unknown time   Allergies:  Allergies  Allergen Reactions  . Bee Venom     Family History  Problem Relation Age of Onset  . Prostate cancer Father   . Heart disease Father   . Heart disease    . Heart disease Mother   . Breast cancer Sister   . Colon cancer Neg Hx    Social  History:  reports that he has been smoking Cigarettes.  He has a 25 pack-year smoking history. He has never used smokeless tobacco. He reports that he drinks about 29.4 oz of alcohol per week. He reports that he does not use illicit drugs.  Review of Systems  All other systems reviewed and are negative.   Physical Exam:  Vital signs in last 24 hours: Temp:  [97.5 F (36.4 C)] 97.5 F (36.4 C) (03/09 0734) Pulse Rate:  [84] 84 (03/09 0734) Resp:  [16] 16 (03/09 0734) BP: (125)/(85) 125/85 mmHg (03/09 0734) SpO2:  [100 %] 100 % (03/09 0734) Weight:  [71.668 kg (158 lb)] 71.668 kg (158 lb) (03/09 0734) Physical Exam  Constitutional: He is oriented to person, place, and time. He appears well-developed and well-nourished.  HENT:  Head: Normocephalic and atraumatic.   Eyes: EOM are normal. Pupils are equal, round, and reactive to light.  Neck: Normal range of motion.  Respiratory: Effort normal.  GI: Soft.  Musculoskeletal: Normal range of motion.  Neurological: He is alert and oriented to person, place, and time.  Psychiatric: He has a normal mood and affect.    Laboratory Data:  No results found for this or any previous visit (from the past 24 hour(s)). No results found for this or any previous visit (from the past 240 hour(s)). Creatinine:  Recent Labs  01/23/15 0916  CREATININE 0.57    Impression/Assessment:   Mr Rudden has a favorable risk clinical stage T1c adenocarcinoma of the prostate. He is 54 years of age and enjoys good health. I would recommend active treatment rather than active surveillance, given his multifocal disease. For a variety of reasons, he is most interested in brachytherapy. From a cancer standpoint, his situation is fairly ideally suited for brachytherapy. He has a relatively small prostate of approximately 36 grams. He has in essence no significant voiding symptoms with an IPSS score of 1 and a bothersome score of 0. He does have 4 positive cores, but each shows very low-volume Gleason 6 disease. There is a small amount of tumor at the base, but not a large volume disease. The only hesitation, of course, is his age. He understands that there is potentially a risk of secondary malignancy. That is the only hesitation we have in recommending brachytherapy for our youngest patient cohorts. On the other hand, he clearly is not as interested in a surgical approach. This is for a variety of reasons. This includes a reluctance with regard to the potential for incontinence, as well as a greater impact on sexual functioning. His job also will require 6-8 weeks out, and he feels that would be financially burdensome. After discussion with Dr Arloa Koh the pros and cons in more detail, he decided to proceed with Brachytherapy   Plan:   Proceed with brachytherapy today as planned  [facility], Juwann C 01/30/2015, 8:20 AM

## 2015-01-30 NOTE — Interval H&P Note (Signed)
History and Physical Interval Note:  01/30/2015 8:35 AM  Richard Santos  has presented today for surgery, with the diagnosis of PROSTATE CANCER  The various methods of treatment have been discussed with the patient and family. After consideration of risks, benefits and other options for treatment, the patient has consented to  Procedure(s): RADIOACTIVE SEED IMPLANT (N/A) as a surgical intervention .  The patient's history has been reviewed, patient examined, no change in status, stable for surgery.  I have reviewed the patient's chart and labs.  Questions were answered to the patient's satisfaction.     Kenric Ginger,[place] S

## 2015-01-30 NOTE — Op Note (Signed)
Preoperative diagnosis: Clinical stage TI C adenocarcinoma the prostate  Postoperative diagnosis: Same  Procedure: I-125 prostate seed implantation with Nucletron robotic implanter  Surgeon: Bernestine Amass M.D. , Arloa Koh, M.D.  Anesthesia: Gen.  Indications: Patient  was diagnosed with clinical stage TI C prostate cancer. We had extensive discussion with him about treatment options versus. He elected to proceed with seed implantation. He underwent consultation my office as well as with Dr. Arloa Koh. He appeared to understand the advantages disadvantages potential risks of this treatment option. Full informed consent has been obtained. The patient is had preoperative ciprofloxacin. PAS compression boots were placed.  Technique and findings: Patient was brought the operating room where he had successful induction of general anesthesia. He was placed in lithotomy position and prepped and draped in usual manner. Appropriate surgical timeout was performed. Radiation oncology department placed a transrectal ultrasound probe anchoring stand. Foley catheter with contrast in the balloon was inserted without difficulty. Anchoring needles were placed within the prostate. Real-time contouring of the urethra prostate and rectum were performed and the dosing parameters were established. Targeted dose was 14 gray. We then came to the operating suite suite for placement of the needles. A second timeout was performed. All needle passage was done with real-time transrectal ultrasound guidance with the sagittal plane. A total of 29 needles were placed. See implantation itself was done with the robotic implanter. 79 active seeds were implanted. A Foley catheter was removed and flexible cystoscopy failed to show any seeds outside the prostate. The Foley catheter was inserted which drained clear urine. The patient was brought to recovery room in stable condition.

## 2015-01-30 NOTE — Anesthesia Postprocedure Evaluation (Signed)
  Anesthesia Post-op Note  Patient: Richard Santos  Procedure(s) Performed: Procedure(s) (LRB): RADIOACTIVE SEED IMPLANT (N/A)  Patient Location: PACU  Anesthesia Type: General  Level of Consciousness: awake and alert   Airway and Oxygen Therapy: Patient Spontanous Breathing  Post-op Pain: mild  Post-op Assessment: Post-op Vital signs reviewed, Patient's Cardiovascular Status Stable, Respiratory Function Stable, Patent Airway and No signs of Nausea or vomiting  Last Vitals:  Filed Vitals:   01/30/15 1115  BP: 148/82  Pulse: 64  Temp:   Resp: 13    Post-op Vital Signs: stable   Complications: No apparent anesthesia complications

## 2015-01-30 NOTE — Anesthesia Procedure Notes (Signed)
Procedure Name: Intubation Date/Time: 01/30/2015 8:40 AM Performed by: Mechele Claude Pre-anesthesia Checklist: Patient identified, Emergency Drugs available, Suction available and Patient being monitored Patient Re-evaluated:Patient Re-evaluated prior to inductionOxygen Delivery Method: Circle System Utilized Preoxygenation: Pre-oxygenation with 100% oxygen Intubation Type: IV induction Ventilation: Mask ventilation without difficulty Laryngoscope Size: Mac and 4 Grade View: Grade III Tube type: Oral Tube size: 8.0 mm Number of attempts: 2 Airway Equipment and Method: Bougie stylet and LTA kit utilized Placement Confirmation: ETT inserted through vocal cords under direct vision,  positive ETCO2 and breath sounds checked- equal and bilateral Secured at: 22 cm Tube secured with: Tape Dental Injury: Teeth and Oropharynx as per pre-operative assessment  Difficulty Due To: Difficult Airway- due to anterior larynx Future Recommendations: Recommend- induction with short-acting agent, and alternative techniques readily available Comments: Easy mask airway. Attempted intubation by Wyvonna Plum, CRNA, unable to visualize cords. Arytenoids visible. Patient intubated with bougie stylet by Dr. Thomes Cake on second attempt with [facility] blade.VSS.

## 2015-01-30 NOTE — Transfer of Care (Signed)
Immediate Anesthesia Transfer of Care Note  Patient: Richard Santos  Procedure(s) Performed: Procedure(s) (LRB): RADIOACTIVE SEED IMPLANT (N/A)  Patient Location: PACU  Anesthesia Type: General  Level of Consciousness: awake, alert  and oriented  Airway & Oxygen Therapy: Patient Spontanous Breathing and Patient connected to face mask oxygen  Post-op Assessment: Report given to PACU RN and Post -op Vital signs reviewed and stable  Post vital signs: Reviewed and stable  Complications: No apparent anesthesia complications Last Vitals:  Filed Vitals:   01/30/15 0734  BP: 125/85  Pulse: 84  Temp: 36.4 C  Resp: 16

## 2015-01-31 ENCOUNTER — Encounter (HOSPITAL_BASED_OUTPATIENT_CLINIC_OR_DEPARTMENT_OTHER): Payer: Self-pay | Admitting: Urology

## 2015-02-11 ENCOUNTER — Other Ambulatory Visit: Payer: Self-pay | Admitting: Internal Medicine

## 2015-02-12 NOTE — Telephone Encounter (Signed)
Done hardcopy to Cherina  

## 2015-02-13 NOTE — Telephone Encounter (Signed)
Done

## 2015-02-22 ENCOUNTER — Encounter: Payer: Self-pay | Admitting: Radiation Oncology

## 2015-02-27 ENCOUNTER — Telehealth: Payer: Self-pay | Admitting: *Deleted

## 2015-02-27 NOTE — Telephone Encounter (Signed)
CALLED PATIENT TO REMIND OF APPTS. FOR 02-28-15, SPOKE WITH PATIENT AND HE IS AWARE OF THESE APPTS.

## 2015-02-28 ENCOUNTER — Ambulatory Visit
Admit: 2015-02-28 | Discharge: 2015-02-28 | Disposition: A | Discharge status: Home / Self Care | Payer: BLUE CROSS/BLUE SHIELD | Attending: Radiation Oncology | Admitting: Radiation Oncology

## 2015-02-28 ENCOUNTER — Ambulatory Visit
Admission: RE | Admit: 2015-02-28 | Discharge: 2015-02-28 | Disposition: A | Discharge status: Home / Self Care | Payer: BLUE CROSS/BLUE SHIELD | Source: Ambulatory Visit | Attending: Radiation Oncology | Admitting: Radiation Oncology

## 2015-02-28 ENCOUNTER — Encounter: Payer: Self-pay | Admitting: Radiation Oncology

## 2015-02-28 VITALS — BP 137/88 | HR 91 | Temp 97.5°F | Ht 72.0 in | Wt 162.2 lb

## 2015-02-28 DIAGNOSIS — C61 Malignant neoplasm of prostate: Secondary | ICD-10-CM

## 2015-02-28 NOTE — Progress Notes (Addendum)
Richard Santos returns today s/p Seed Implant for Prostate cancer.  He reports that he voids every hour during the day,but denies nocturia.  He notes stinging at end of urinary stream. Denies any rectal irritation, but has intermittent episodes of diarrhea/watery stools, last on Tuesday this week. Relief with Immodium.

## 2015-02-28 NOTE — Progress Notes (Signed)
Dr. Rana Snare  Follow-up note:  [name] returns today approximately 1 month following his prostate seed implant with Dr. Risa Grill in the management of his stage TIc favorable risk adenocarcinoma prostate.  He still doing well although he does admit to slight urinary urgency.  No GI difficulties.  He has had intermittent watery stools for which he took Imodium.  This should not be related to his seed implant.  His CT scan today shows what appears to be a favorable seed distribution.  Physical examination: Alert and oriented. Filed Vitals:   02/28/15 1132  BP: 137/88  Pulse: 91  Temp: 97.5 F (36.4 C)   Rectal examination not performed today.  Impression: Satisfactory progress.  We'll move ahead with his post implant dosimetry and forward the results to Dr. Risa Grill.  Dr. Risa Grill will see him back for a follow-up visit in approximately 3-4 months at which time he will have his first PSA determination.  I ask that Dr. Risa Grill keep me posted on his progress.

## 2015-02-28 NOTE — Progress Notes (Signed)
Complex simulation note: The patient was taken to the CT simulator.  His pelvis was scanned.  The CT data set was sent to the planning system where I contoured his prostate and rectum.  He is now ready for 3-D simulation to assess the quality of his implant.

## 2015-03-04 ENCOUNTER — Other Ambulatory Visit: Payer: Self-pay | Admitting: Radiation Oncology

## 2015-03-06 ENCOUNTER — Encounter: Payer: Self-pay | Admitting: Radiation Oncology

## 2015-03-06 DIAGNOSIS — C61 Malignant neoplasm of prostate: Secondary | ICD-10-CM | POA: Diagnosis not present

## 2015-03-06 NOTE — Progress Notes (Signed)
CC: Dr. Cathlean Cower, Dr. Rana Snare  Post implant CT dosimetry note:  Richard Santos underwent post-implant dosimetry to assess the quality of his implant.  His intraoperative prostate volume by ultrasound was 54.3 mL while his prostate volume post-implant by CT was 50.7 mL, a reasonable correlation.  Dose volume histograms were obtained for the prostate and rectum.  His prostate  D90 is 117.2% and his prostate V100 is 96.7%, both excellent.  0 mL of rectum received the prescribed dose of 14,500 cGy.  In summary, Mr. [place] has excellent dosimetry with a low risk for late rectal toxicity.

## 2015-04-20 ENCOUNTER — Other Ambulatory Visit: Payer: Self-pay | Admitting: Internal Medicine

## 2015-07-08 ENCOUNTER — Ambulatory Visit (INDEPENDENT_AMBULATORY_CARE_PROVIDER_SITE_OTHER): Payer: BLUE CROSS/BLUE SHIELD | Admitting: Internal Medicine

## 2015-07-08 ENCOUNTER — Encounter: Payer: Self-pay | Admitting: Internal Medicine

## 2015-07-08 VITALS — BP 130/60 | HR 70 | Ht 72.0 in | Wt 159.1 lb

## 2015-07-08 DIAGNOSIS — R0989 Other specified symptoms and signs involving the circulatory and respiratory systems: Secondary | ICD-10-CM

## 2015-07-08 DIAGNOSIS — R1310 Dysphagia, unspecified: Secondary | ICD-10-CM

## 2015-07-08 DIAGNOSIS — F458 Other somatoform disorders: Secondary | ICD-10-CM | POA: Diagnosis not present

## 2015-07-08 DIAGNOSIS — R198 Other specified symptoms and signs involving the digestive system and abdomen: Secondary | ICD-10-CM

## 2015-07-08 NOTE — Progress Notes (Signed)
   Subjective:    Patient ID: Richard Santos, male    DOB: [birth year], 54 y.o.   MRN: [id number] Cc: swallowing problems HPI 1 yr hx difficulty swallowing - has had to cut smaller and smaller and chew well. Vague impact dysphagia. Some globus. On PPI.  Wt Readings from Last 3 Encounters:  07/08/15 159 lb 2 oz (72.179 kg)  02/28/15 162 lb 3.2 oz (73.573 kg)  01/30/15 158 lb (71.668 kg)   "I have to know what is going on"  Does have a large deductible and is concerned about bills (has had prostate cancer and has a lot of bills) but wants to evaluate Medications, allergies, past medical history, past surgical history, family history and social history are reviewed and updated in the EMR.  Review of Systems As above    Objective:   Physical Exam '@BP'$  130/60 mmHg  Pulse 70  Ht 6' (1.829 m)  Wt 159 lb 2 oz (72.179 kg)  BMI 21.58 kg/m2@  General:  Well-developed, well-nourished and in no acute distress Eyes:  anicteric. Neck:   supple w/o thyromegaly or mass.  Lungs: Clear to auscultation bilaterally. Heart:  S1S2, no rubs, murmurs, gallops. Abdomen:  soft, non-tender, no hepatosplenomegaly, hernia, or mass and BS+.  Lymph:  no cervical or supraclavicular adenopathy. Psych:  Mildly anxious   Data Reviewed: Prior EGD     Assessment & Plan:  Dysphagia  Globus sensation  EGD, possible dilation to evaluate and treat dysphagia The risks and benefits as well as alternatives of endoscopic procedure(s) have been discussed and reviewed. All questions answered. The patient agrees to proceed.  I appreciate the opportunity to care for him

## 2015-07-08 NOTE — Patient Instructions (Addendum)
You have been given a separate informational sheet regarding your tobacco use, the importance of quitting and local resources to help you quit.   You have been scheduled for an endoscopy. Please follow written instructions given to you at your visit today. If you use inhalers (even only as needed), please bring them with you on the day of your procedure.  I appreciate the opportunity to care for you. Silvano Rusk, MD, [facility]

## 2015-07-12 ENCOUNTER — Other Ambulatory Visit: Payer: Self-pay | Admitting: Internal Medicine

## 2015-07-24 ENCOUNTER — Other Ambulatory Visit: Payer: Self-pay | Admitting: Internal Medicine

## 2015-07-24 ENCOUNTER — Encounter: Payer: Self-pay | Admitting: Internal Medicine

## 2015-07-30 ENCOUNTER — Other Ambulatory Visit (INDEPENDENT_AMBULATORY_CARE_PROVIDER_SITE_OTHER): Payer: BLUE CROSS/BLUE SHIELD

## 2015-07-30 DIAGNOSIS — Z0000 Encounter for general adult medical examination without abnormal findings: Secondary | ICD-10-CM

## 2015-07-30 LAB — URINALYSIS, ROUTINE W REFLEX MICROSCOPIC
HGB URINE DIPSTICK: NEGATIVE
Leukocytes, UA: NEGATIVE
NITRITE: NEGATIVE
RBC / HPF: NONE SEEN (ref 0–?)
Specific Gravity, Urine: >=1.03 — AB (ref 1.000–1.030)
Total Protein, Urine: 30 — AB
URINE GLUCOSE: NEGATIVE
Urobilinogen, UA: 0.2 (ref 0.0–1.0)
WBC UA: NONE SEEN (ref 0–?)
pH: 6 (ref 5.0–8.0)

## 2015-07-30 LAB — BASIC METABOLIC PANEL
BUN: 11 mg/dL (ref 6–23)
CO2: 27 mEq/L (ref 19–32)
CREATININE: 0.59 mg/dL (ref 0.40–1.50)
Calcium: 9.4 mg/dL (ref 8.4–10.5)
Chloride: 105 mEq/L (ref 96–112)
GFR: 152.18 mL/min (ref >60.00)
Glucose, Bld: 114 mg/dL — ABNORMAL HIGH (ref 70–99)
POTASSIUM: 4.2 meq/L (ref 3.5–5.1)
Sodium: 143 mEq/L (ref 135–145)

## 2015-07-30 LAB — CBC WITH DIFFERENTIAL/PLATELET
BASOS PCT: 0.9 % (ref 0.0–3.0)
Basophils Absolute: 0 10*3/uL (ref 0.0–0.1)
EOS PCT: 8 % — AB (ref 0.0–5.0)
Eosinophils Absolute: 0.4 10*3/uL (ref 0.0–0.7)
HEMATOCRIT: 43.2 % (ref 39.0–52.0)
HEMOGLOBIN: 14.9 g/dL (ref 13.0–17.0)
LYMPHS PCT: 27.2 % (ref 12.0–46.0)
Lymphs Abs: 1.4 10*3/uL (ref 0.7–4.0)
MCHC: 34.6 g/dL (ref 30.0–36.0)
MCV: 100.7 fl — AB (ref 78.0–100.0)
Monocytes Absolute: 0.3 10*3/uL (ref 0.1–1.0)
Monocytes Relative: 6.9 % (ref 3.0–12.0)
Neutro Abs: 2.9 10*3/uL (ref 1.4–7.7)
Neutrophils Relative %: 57 % (ref 43.0–77.0)
Platelets: 218 10*3/uL (ref 150.0–400.0)
RBC: 4.29 Mil/uL (ref 4.22–5.81)
RDW: 13.5 % (ref 11.5–15.5)
WBC: 5.1 10*3/uL (ref 4.0–10.5)

## 2015-07-30 LAB — HEPATIC FUNCTION PANEL
ALT: 56 U/L — ABNORMAL HIGH (ref 0–53)
AST: 58 U/L — ABNORMAL HIGH (ref 0–37)
Albumin: 4.4 g/dL (ref 3.5–5.2)
Alkaline Phosphatase: 57 U/L (ref 39–117)
BILIRUBIN TOTAL: 0.4 mg/dL (ref 0.2–1.2)
Bilirubin, Direct: 0.1 mg/dL (ref 0.0–0.3)
Total Protein: 7.1 g/dL (ref 6.0–8.3)

## 2015-07-30 LAB — LIPID PANEL
CHOL/HDL RATIO: 4
Cholesterol: 208 mg/dL — ABNORMAL HIGH (ref 0–200)
HDL: 53.4 mg/dL (ref >39.00)
LDL CALC: 131 mg/dL — AB (ref 0–99)
NONHDL: 155.02
Triglycerides: 118 mg/dL (ref 0.0–149.0)
VLDL: 23.6 mg/dL (ref 0.0–40.0)

## 2015-07-30 LAB — TSH: TSH: 1.58 u[IU]/mL (ref 0.35–4.50)

## 2015-07-30 LAB — PSA: PSA: 1.52 ng/mL (ref 0.10–4.00)

## 2015-08-01 ENCOUNTER — Ambulatory Visit (INDEPENDENT_AMBULATORY_CARE_PROVIDER_SITE_OTHER): Payer: BLUE CROSS/BLUE SHIELD | Admitting: Internal Medicine

## 2015-08-01 ENCOUNTER — Encounter: Payer: Self-pay | Admitting: Internal Medicine

## 2015-08-01 VITALS — BP 134/82 | HR 88 | Temp 98.6°F | Ht 72.0 in | Wt 158.0 lb

## 2015-08-01 DIAGNOSIS — Z0000 Encounter for general adult medical examination without abnormal findings: Secondary | ICD-10-CM | POA: Diagnosis not present

## 2015-08-01 DIAGNOSIS — R739 Hyperglycemia, unspecified: Secondary | ICD-10-CM

## 2015-08-01 DIAGNOSIS — R7302 Impaired glucose tolerance (oral): Secondary | ICD-10-CM

## 2015-08-01 DIAGNOSIS — E785 Hyperlipidemia, unspecified: Secondary | ICD-10-CM

## 2015-08-01 MED ORDER — LOVASTATIN 40 MG PO TABS: 40.0000 mg | ORAL_TABLET | Freq: Every day | ORAL | Status: DC

## 2015-08-01 MED ORDER — LOVASTATIN 20 MG PO TABS: 20.0000 mg | ORAL_TABLET | Freq: Every day | ORAL | Status: DC

## 2015-08-01 NOTE — Assessment & Plan Note (Signed)

## 2015-08-01 NOTE — Progress Notes (Signed)
Pre visit review using our clinic review tool, if applicable. No additional management support is needed unless otherwise documented below in the visit note. 

## 2015-08-01 NOTE — Patient Instructions (Signed)
OK to increase the Lovastatin to 40 mg per day  Please continue all other medications as before, and refills have been done if requested.  Please have the pharmacy call with any other refills you may need.  Please continue your efforts at being more active, low cholesterol diet, and weight control.  You are otherwise up to date with prevention measures today.  Please keep your appointments with your specialists as you may have planned  Your lab work will be forwarded to Dr Risa Grill  Please return in 1 year for your yearly visit, or sooner if needed, with Lab testing done 3-5 days before

## 2015-08-01 NOTE — Assessment & Plan Note (Signed)
Stable,  to f/u any worsening symptoms or concerns 

## 2015-08-01 NOTE — Progress Notes (Signed)
Subjective:    Patient ID: Richard Santos, male    DOB: 21-Jun-1961, 54 y.o.   MRN: [id number]  HPI  Here for wellness and f/u;  Overall doing ok;  Pt denies Chest pain, worsening SOB, DOE, wheezing, orthopnea, PND, worsening LE edema, palpitations, dizziness or syncope.  Pt denies neurological change such as new headache, facial or extremity weakness.  Pt denies polydipsia, polyuria, or low sugar symptoms. Pt states overall good compliance with treatment and medications, good tolerability, and has been trying to follow appropriate diet.  Pt denies worsening depressive symptoms, suicidal ideation or panic. No fever, night sweats, wt loss, loss of appetite, or other constitutional symptoms.  Pt states good ability with ADL's, has low fall risk, home safety reviewed and adequate, no other significant changes in hearing or vision, and only occasionally active with exercise.  For EGD soon for likely dilation with GI.  No current complaints.  Still drinks 6 beers per day, still smoking - not ready to quit.  Gets "shaky" with one or both hands but intermittent, usually with eating, but no other w/d symptoms. Past Medical History  Diagnosis Date  . Anxiety   . ED (erectile dysfunction)   . Allergic rhinitis   . GERD (gastroesophageal reflux disease)   . Finger injury     cut pads off 3rd and 4th finger left hand/due to lawn mower accident  . Hyperlipidemia   . Chronic low back pain   . Prostate cancer dx 10/02/14    stage T1c  . Sigmoid diverticulosis   . Incomplete right bundle branch block   . DDD (degenerative disc disease), lumbar   . Wears glasses   . Prostate cancer    Past Surgical History  Procedure Laterality Date  . Prostate biopsy  10/02/14  . Esophagogastroduodenoscopy  08-05-2010  . Colonoscopy w/ polypectomy  04-19-2012  . Radioactive seed implant N/A 01/30/2015    Procedure: RADIOACTIVE SEED IMPLANT;  Surgeon: Rana Snare, MD;  Location: [facility];  Service:  Urology;  Laterality: N/A;    reports that he has been smoking Cigarettes.  He has a 25 pack-year smoking history. He has never used smokeless tobacco. He reports that he drinks about 29.4 oz of alcohol per week. He reports that he does not use illicit drugs. family history includes Breast cancer in his sister; Heart disease in his father, mother, and another family member; Prostate cancer in his father. There is no history of Colon cancer. Allergies  Allergen Reactions  . Bee Venom    Current Outpatient Prescriptions on File Prior to Visit  Medication Sig Dispense Refill  . aspirin 81 MG tablet Take 81 mg by mouth daily.    . busPIRone (BUSPAR) 10 MG tablet Take 1 tablet (10 mg total) by mouth 2 (two) times daily. 180 tablet 3  . cetirizine (ZYRTEC) 10 MG tablet Take 1 tablet (10 mg total) by mouth daily. 30 tablet 11  . EPINEPHrine (EPIPEN) 0.3 mg/0.3 mL IJ SOAJ injection Inject 0.3 mLs (0.3 mg total) into the muscle once. 2 Device 2  . ibuprofen (ADVIL,MOTRIN) 200 MG tablet Take 200 mg by mouth every 6 (six) hours as needed for pain. Takes two daily    . pantoprazole (PROTONIX) 40 MG tablet TAKE ONE TABLET BY MOUTH ONCE DAILY. TAKE 30 MINUTES BEFORE A MEAL 90 tablet 3  . tamsulosin (FLOMAX) 0.4 MG CAPS capsule Take 0.4 mg by mouth daily.    . traMADol (ULTRAM) 50 MG tablet  TAKE 1/2 TABLET BY MOUTH TWICE A DAY AS NEEDED FOR PAIN 30 tablet 5  . HYDROcodone-acetaminophen (NORCO/VICODIN) 5-325 MG per tablet Take 1-2 tablets by mouth every 6 (six) hours as needed. (Patient not taking: Reported on 08/01/2015) 20 tablet 0   No current facility-administered medications on file prior to visit.   Review of Systems Constitutional: Negative for increased diaphoresis, other activity, appetite or siginficant weight change other than noted HENT: Negative for worsening hearing loss, ear pain, facial swelling, mouth sores and neck stiffness.   Eyes: Negative for other worsening pain, redness or visual  disturbance.  Respiratory: Negative for shortness of breath and wheezing  Cardiovascular: Negative for chest pain and palpitations.  Gastrointestinal: Negative for diarrhea, blood in stool, abdominal distention or other pain Genitourinary: Negative for hematuria, flank pain or change in urine volume.  Musculoskeletal: Negative for myalgias or other joint complaints.  Skin: Negative for color change and wound or drainage.  Neurological: Negative for syncope and numbness. other than noted Hematological: Negative for adenopathy. or other swelling Psychiatric/Behavioral: Negative for hallucinations, SI, self-injury, decreased concentration or other worsening agitation.      Objective:   Physical Exam BP 134/82 mmHg  Pulse 88  Temp(Src) 98.6 F (37 C) (Oral)  Ht 6' (1.829 m)  Wt 158 lb (71.668 kg)  BMI 21.42 kg/m2  SpO2 97% VS noted,  Constitutional: Pt is oriented to person, place, and time. Appears well-developed and well-nourished, in no significant distress Head: Normocephalic and atraumatic.  Right Ear: External ear normal.  Left Ear: External ear normal.  Nose: Nose normal.  Mouth/Throat: Oropharynx is clear and moist.  Eyes: Conjunctivae and EOM are normal. Pupils are equal, round, and reactive to light.  Neck: Normal range of motion. Neck supple. No JVD present. No tracheal deviation present or significant neck LA or mass Cardiovascular: Normal rate, regular rhythm, normal heart sounds and intact distal pulses.   Pulmonary/Chest: Effort normal and breath sounds without rales or wheezing  Abdominal: Soft. Bowel sounds are normal. NT. No HSM  Musculoskeletal: Normal range of motion. Exhibits no edema.  Lymphadenopathy:  Has no cervical adenopathy.  Neurological: Pt is alert and oriented to person, place, and time. Pt has normal reflexes. No cranial nerve deficit. Motor grossly intact Skin: Skin is warm and dry. No rash noted.  Psychiatric:  Has normal mood and affect. Behavior  is normal.     Assessment & Plan:

## 2015-08-01 NOTE — Assessment & Plan Note (Signed)
Improved, but still mild elevated - for incr lovastatin to 40 mg

## 2015-08-07 ENCOUNTER — Encounter: Payer: Self-pay | Admitting: Internal Medicine

## 2015-08-07 ENCOUNTER — Ambulatory Visit (AMBULATORY_SURGERY_CENTER): Payer: BLUE CROSS/BLUE SHIELD | Admitting: Internal Medicine

## 2015-08-07 VITALS — BP 138/86 | HR 68 | Temp 97.3°F | Resp 22 | Ht 72.0 in | Wt 158.0 lb

## 2015-08-07 DIAGNOSIS — R1310 Dysphagia, unspecified: Secondary | ICD-10-CM

## 2015-08-07 MED ORDER — SODIUM CHLORIDE 0.9 % IV SOLN: 500.0000 mL | INTRAVENOUS | Status: DC

## 2015-08-07 NOTE — Progress Notes (Signed)
Transferred to recovery room. A/O x3, pleased with MAC.  VSS.  Report to Celia, RN. 

## 2015-08-07 NOTE — Progress Notes (Signed)
Called to room to assist during endoscopic procedure.  Patient ID and intended procedure confirmed with present staff. Received instructions for my participation in the procedure from the performing physician.  

## 2015-08-07 NOTE — Patient Instructions (Addendum)
I took biopsies of the esophagus to look for underlying inflammation that could be related to your symptoms and also dilated it to see if your swallowing improves.  Will call with results and follow-up.  I appreciate the opportunity to care for you. Gatha Mayer, MD, [facility]  Discharge instructions given. Biopsies taken. Handout on a dilatation diet. Resume previous medications. YOU HAD AN ENDOSCOPIC PROCEDURE TODAY AT [facility]:   Refer to the procedure report that was given to you for any specific questions about what was found during the examination.  If the procedure report does not answer your questions, please call your gastroenterologist to clarify.  If you requested that your care partner not be given the details of your procedure findings, then the procedure report has been included in a sealed envelope for you to review at your convenience later.  YOU SHOULD EXPECT: Some feelings of bloating in the abdomen. Passage of more gas than usual.  Walking can help get rid of the air that was put into your GI tract during the procedure and reduce the bloating. If you had a lower endoscopy (such as a colonoscopy or flexible sigmoidoscopy) you may notice spotting of blood in your stool or on the toilet paper. If you underwent a bowel prep for your procedure, you may not have a normal bowel movement for a few days.  Please Note:  You might notice some irritation and congestion in your nose or some drainage.  This is from the oxygen used during your procedure.  There is no need for concern and it should clear up in a day or so.  SYMPTOMS TO REPORT IMMEDIATELY:    Following upper endoscopy (EGD)  Vomiting of blood or coffee ground material  New chest pain or pain under the shoulder blades  Painful or persistently difficult swallowing  New shortness of breath  Fever of 100F or higher  Black, tarry-looking stools  For urgent or emergent issues, a gastroenterologist  can be reached at any hour by calling [phone].   DIET: Your first meal following the procedure should be a small meal and then it is ok to progress to your normal diet. Heavy or fried foods are harder to digest and may make you feel nauseous or bloated.  Likewise, meals heavy in dairy and vegetables can increase bloating.  Drink plenty of fluids but you should avoid alcoholic beverages for 24 hours.  ACTIVITY:  You should plan to take it easy for the rest of today and you should NOT DRIVE or use heavy machinery until tomorrow (because of the sedation medicines used during the test).    FOLLOW UP: Our staff will call the number listed on your records the next business day following your procedure to check on you and address any questions or concerns that you may have regarding the information given to you following your procedure. If we do not reach you, we will leave a message.  However, if you are feeling well and you are not experiencing any problems, there is no need to return our call.  We will assume that you have returned to your regular daily activities without incident.  If any biopsies were taken you will be contacted by phone or by letter within the next 1-3 weeks.  Please call us at [phone] if you have not heard about the biopsies in 3 weeks.    SIGNATURES/CONFIDENTIALITY: You and/or your care partner have signed paperwork which will be entered  into your electronic medical record.  These signatures attest to the fact that that the information above on your After Visit Summary has been reviewed and is understood.  Full responsibility of the confidentiality of this discharge information lies with you and/or your care-partner.

## 2015-08-07 NOTE — Op Note (Signed)
[facility]  [address] [place], [address]   ENDOSCOPY PROCEDURE REPORT  PATIENT: Richard Santos, Richard Santos  MR#: [id number] BIRTHDATE: [birth year] , 53  yrs. old GENDER: male ENDOSCOPIST: Gatha Mayer, MD, [facility] PROCEDURE DATE:  08/07/2015 PROCEDURE:  EGD w/ biopsy and Maloney dilation of esophagus ASA CLASS:     Class II INDICATIONS:  dysphagia. MEDICATIONS: Propofol 200 mg IV and Monitored anesthesia care TOPICAL ANESTHETIC: none  DESCRIPTION OF PROCEDURE: After the risks benefits and alternatives of the procedure were thoroughly explained, informed consent was obtained.  The LB [id number] endoscope was introduced through the mouth and advanced to the second portion of the duodenum , Without limitations.  The instrument was slowly withdrawn as the mucosa was fully examined.    1) Numerous subtle rings and furrows in esophagus raising ? eosinophilic esophagitis so biopsied taken throughout. 2) Irregular Z-ine as in 2011 and biopsies then did not show intestinal metaplasia so not biopsied again. 3) otherwise normal EGD - 54 Fr Maloney dilator easily passed, no heme, to treat dysphagia.  Retroflexed views revealed no abnormalities.     The scope was then withdrawn from the patient and the procedure completed.  COMPLICATIONS: There were no immediate complications.  ENDOSCOPIC IMPRESSION: 1) Numerous subtle rings and furrows in esophagus raising ? eosinophilic esophagitis so biopsied taken throughout. 2) Irregular Z-ine as in 2011 and biopsies then did not show intestinal metaplasia so not biopsied again. 3) otherwise normal EGD - 54 Fr Maloney dilator easily passed, no heme, to treat dysphagia  RECOMMENDATIONS: 1.  Clear liquids until 1130, then soft foods rest of day.  Resume prior diet tomorrow. 2.  Office will call with results    eSigned:  Gatha Mayer, MD, [facility] 08/07/2015 10:23 AM    CC:The Patient

## 2015-08-08 ENCOUNTER — Telehealth: Payer: Self-pay

## 2015-08-08 ENCOUNTER — Other Ambulatory Visit: Payer: Self-pay | Admitting: *Deleted

## 2015-08-08 MED ORDER — TRAMADOL HCL 50 MG PO TABS: ORAL_TABLET | ORAL | Status: DC

## 2015-08-08 NOTE — Telephone Encounter (Signed)
Rx faxed to pharmacy  

## 2015-08-08 NOTE — Telephone Encounter (Signed)
Done hardcopy to Richard Santos

## 2015-08-08 NOTE — Telephone Encounter (Signed)
Rf req for Tramadol 50 mg 1/2 po bid prn pain. Last filled 07/09/15. Ok to Rf?

## 2015-08-08 NOTE — Telephone Encounter (Signed)
  Follow up Call-  Call back number 08/07/2015  Post procedure Call Back phone  # [phone]  Permission to leave phone message Yes     Patient questions:  Do you have a fever, pain , or abdominal swelling? No. Pain Score  0 *  Have you tolerated food without any problems? Yes.    Have you been able to return to your normal activities? Yes.    Do you have any questions about your discharge instructions: Diet   No. Medications  No. Follow up visit  No.  Do you have questions or concerns about your Care? No.  Actions: * If pain score is 4 or above: No action needed, pain <4.

## 2015-08-12 ENCOUNTER — Other Ambulatory Visit: Payer: Self-pay | Admitting: *Deleted

## 2015-08-13 MED ORDER — TRAMADOL HCL 50 MG PO TABS: ORAL_TABLET | ORAL | Status: DC

## 2015-08-13 NOTE — Telephone Encounter (Signed)
Is this ok to refill...Richard Santos

## 2015-08-13 NOTE — Telephone Encounter (Signed)
Done Done hardcopy to Dahlia  

## 2015-08-13 NOTE — Telephone Encounter (Signed)
Faxed script back to [facility].../lmb 

## 2015-08-20 NOTE — Progress Notes (Signed)
Quick Note:  Biopsies do not show any problems - plese call from office Hope dilation helped - f/u recurrent sxs prn  LEC no letter or recall ______

## 2015-09-02 ENCOUNTER — Other Ambulatory Visit: Payer: Self-pay | Admitting: Internal Medicine

## 2015-09-02 NOTE — Telephone Encounter (Signed)
Please advise, thank you.

## 2015-09-04 NOTE — Telephone Encounter (Signed)
Refill x 1 year 

## 2016-02-14 ENCOUNTER — Other Ambulatory Visit: Payer: Self-pay | Admitting: Internal Medicine

## 2016-02-14 NOTE — Telephone Encounter (Signed)
Done hardcopy to Corinne  

## 2016-02-17 NOTE — Telephone Encounter (Signed)
Medication has been sent to pharmacy.  °

## 2016-06-05 ENCOUNTER — Other Ambulatory Visit: Payer: Self-pay | Admitting: Internal Medicine

## 2016-08-13 ENCOUNTER — Other Ambulatory Visit (INDEPENDENT_AMBULATORY_CARE_PROVIDER_SITE_OTHER): Payer: BLUE CROSS/BLUE SHIELD

## 2016-08-13 DIAGNOSIS — R739 Hyperglycemia, unspecified: Secondary | ICD-10-CM

## 2016-08-13 DIAGNOSIS — Z0000 Encounter for general adult medical examination without abnormal findings: Secondary | ICD-10-CM | POA: Diagnosis not present

## 2016-08-13 LAB — URINALYSIS, ROUTINE W REFLEX MICROSCOPIC
Bilirubin Urine: NEGATIVE
Hgb urine dipstick: NEGATIVE
KETONES UR: NEGATIVE
LEUKOCYTES UA: NEGATIVE
Nitrite: NEGATIVE
Total Protein, Urine: NEGATIVE
Urine Glucose: NEGATIVE
Urobilinogen, UA: 0.2 (ref 0.0–1.0)
pH: 6 (ref 5.0–8.0)

## 2016-08-13 LAB — PSA: PSA: 0.97 ng/mL (ref 0.10–4.00)

## 2016-08-13 LAB — CBC WITH DIFFERENTIAL/PLATELET
BASOS PCT: 0.3 % (ref 0.0–3.0)
Basophils Absolute: 0 10*3/uL (ref 0.0–0.1)
EOS PCT: 5 % (ref 0.0–5.0)
Eosinophils Absolute: 0.3 10*3/uL (ref 0.0–0.7)
HCT: 39.2 % (ref 39.0–52.0)
Hemoglobin: 13.8 g/dL (ref 13.0–17.0)
Lymphocytes Relative: 22.4 % (ref 12.0–46.0)
Lymphs Abs: 1.5 10*3/uL (ref 0.7–4.0)
MCHC: 35.2 g/dL (ref 30.0–36.0)
MCV: 98.1 fl (ref 78.0–100.0)
MONO ABS: 0.4 10*3/uL (ref 0.1–1.0)
Monocytes Relative: 6.6 % (ref 3.0–12.0)
NEUTROS PCT: 65.7 % (ref 43.0–77.0)
Neutro Abs: 4.4 10*3/uL (ref 1.4–7.7)
PLATELETS: 183 10*3/uL (ref 150.0–400.0)
RBC: 4 Mil/uL — ABNORMAL LOW (ref 4.22–5.81)
RDW: 13.2 % (ref 11.5–15.5)
WBC: 6.7 10*3/uL (ref 4.0–10.5)

## 2016-08-13 LAB — LIPID PANEL
CHOL/HDL RATIO: 4
CHOLESTEROL: 163 mg/dL (ref 0–200)
HDL: 41.2 mg/dL (ref >39.00)
NONHDL: 121.94
Triglycerides: 216 mg/dL — ABNORMAL HIGH (ref 0.0–149.0)
VLDL: 43.2 mg/dL — AB (ref 0.0–40.0)

## 2016-08-13 LAB — BASIC METABOLIC PANEL
BUN: 14 mg/dL (ref 6–23)
CO2: 30 mEq/L (ref 19–32)
Calcium: 9 mg/dL (ref 8.4–10.5)
Chloride: 102 mEq/L (ref 96–112)
Creatinine, Ser: 0.67 mg/dL (ref 0.40–1.50)
GFR: 130.91 mL/min (ref >60.00)
Glucose, Bld: 98 mg/dL (ref 70–99)
POTASSIUM: 4.4 meq/L (ref 3.5–5.1)
SODIUM: 137 meq/L (ref 135–145)

## 2016-08-13 LAB — LDL CHOLESTEROL, DIRECT: LDL DIRECT: 102 mg/dL

## 2016-08-13 LAB — HEPATIC FUNCTION PANEL
ALT: 48 U/L (ref 0–53)
AST: 39 U/L — AB (ref 0–37)
Albumin: 4.2 g/dL (ref 3.5–5.2)
Alkaline Phosphatase: 61 U/L (ref 39–117)
BILIRUBIN TOTAL: 0.6 mg/dL (ref 0.2–1.2)
Bilirubin, Direct: 0.2 mg/dL (ref 0.0–0.3)
Total Protein: 7 g/dL (ref 6.0–8.3)

## 2016-08-13 LAB — TSH: TSH: 1.24 u[IU]/mL (ref 0.35–4.50)

## 2016-08-13 LAB — HEMOGLOBIN A1C: HEMOGLOBIN A1C: 5.6 % (ref 4.6–6.5)

## 2016-08-14 LAB — HEPATITIS C ANTIBODY: HCV AB: NEGATIVE

## 2016-08-20 ENCOUNTER — Ambulatory Visit (INDEPENDENT_AMBULATORY_CARE_PROVIDER_SITE_OTHER): Payer: BLUE CROSS/BLUE SHIELD | Admitting: Internal Medicine

## 2016-08-20 ENCOUNTER — Encounter: Payer: Self-pay | Admitting: Internal Medicine

## 2016-08-20 VITALS — BP 136/82 | HR 77 | Temp 98.3°F | Resp 20 | Wt 164.2 lb

## 2016-08-20 DIAGNOSIS — F528 Other sexual dysfunction not due to a substance or known physiological condition: Secondary | ICD-10-CM | POA: Diagnosis not present

## 2016-08-20 DIAGNOSIS — Z0001 Encounter for general adult medical examination with abnormal findings: Secondary | ICD-10-CM

## 2016-08-20 DIAGNOSIS — I10 Essential (primary) hypertension: Secondary | ICD-10-CM | POA: Insufficient documentation

## 2016-08-20 DIAGNOSIS — R6889 Other general symptoms and signs: Secondary | ICD-10-CM | POA: Diagnosis not present

## 2016-08-20 HISTORY — DX: Essential (primary) hypertension: I10

## 2016-08-20 MED ORDER — SILDENAFIL CITRATE 20 MG PO TABS: ORAL_TABLET | ORAL | 11 refills | Status: DC

## 2016-08-20 MED ORDER — LOSARTAN POTASSIUM 25 MG PO TABS: 25.0000 mg | ORAL_TABLET | Freq: Every day | ORAL | 3 refills | Status: DC

## 2016-08-20 NOTE — Assessment & Plan Note (Signed)
[place] for revatio prn,  to f/u any worsening symptoms or concerns

## 2016-08-20 NOTE — Assessment & Plan Note (Signed)
Mild, add losartan 25 mg qd,  to f/u any worsening symptoms or concerns

## 2016-08-20 NOTE — Patient Instructions (Signed)
Please take all new medication as prescribed - the losartan 25 mg for blood pressure  Please check your Blood pressure on a regular basis; the goal is to be less than 140/90  You will be contacted regarding the referral for: generic viagra (revatio)  Please continue all other medications as before, and refills have been done if requested.  Please have the pharmacy call with any other refills you may need.  Please continue your efforts at being more active, low cholesterol diet, and weight control.  You are otherwise up to date with prevention measures today.  Please keep your appointments with your specialists as you may have planned  Please return in 1 year for your yearly visit, or sooner if needed, with Lab testing done 3-5 days before

## 2016-08-20 NOTE — Assessment & Plan Note (Signed)

## 2016-08-20 NOTE — Progress Notes (Signed)
Pre visit review using our clinic review tool, if applicable. No additional management support is needed unless otherwise documented below in the visit note. 

## 2016-08-20 NOTE — Progress Notes (Signed)
Subjective:    Patient ID: Richard Santos, male    DOB: 24-Apr-1961, 55 y.o.   MRN: [id number]  HPI  Here for wellness and f/u;  Overall doing ok;  Pt denies Chest pain, worsening SOB, DOE, wheezing, orthopnea, PND, worsening LE edema, palpitations, dizziness or syncope.  Pt denies neurological change such as new headache, facial or extremity weakness.  Pt denies polydipsia, polyuria, or low sugar symptoms. Pt states overall good compliance with treatment and medications, good tolerability, and has been trying to follow appropriate diet.  Pt denies worsening depressive symptoms, suicidal ideation or panic. No fever, night sweats, wt loss, loss of appetite, or other constitutional symptoms.  Pt states good ability with ADL's, has low fall risk, home safety reviewed and adequate, no other significant changes in hearing or vision, and only occasionally active with exercise. Has been less active recently, gained several lbs  Declines flu shot Wt Readings from Last 3 Encounters:  08/20/16 164 lb 4 oz (74.5 kg)  08/07/15 158 lb (71.7 kg)  08/01/15 158 lb (71.7 kg)   Past Medical History:  Diagnosis Date  . Allergic rhinitis   . Anxiety   . Chronic low back pain   . DDD (degenerative disc disease), lumbar   . ED (erectile dysfunction)   . Finger injury    cut pads off 3rd and 4th finger left hand/due to lawn mower accident  . GERD (gastroesophageal reflux disease)   . Hyperlipidemia   . Incomplete right bundle branch block   . Kidney stones   . Prostate cancer ([place]) dx 10/02/14   stage T1c  . Prostate cancer ([place])   . Sigmoid diverticulosis   . Wears glasses    Past Surgical History:  Procedure Laterality Date  . COLONOSCOPY W/ POLYPECTOMY  04-19-2012  . ESOPHAGOGASTRODUODENOSCOPY  08-05-2010  . PROSTATE BIOPSY  10/02/14  . RADIOACTIVE SEED IMPLANT N/A 01/30/2015   Procedure: RADIOACTIVE SEED IMPLANT;  Surgeon: Rana Snare, MD;  Location: [facility];  Service: Urology;   Laterality: N/A;    reports that he has been smoking Cigarettes.  He has a 25.00 pack-year smoking history. He has never used smokeless tobacco. He reports that he drinks about 29.4 oz of alcohol per week . He reports that he does not use drugs. family history includes Breast cancer in his sister; Heart disease in his father and mother; Prostate cancer in his father. Allergies  Allergen Reactions  . Bee Venom    Current Outpatient Prescriptions on File Prior to Visit  Medication Sig Dispense Refill  . aspirin 81 MG tablet Take 81 mg by mouth daily.    . busPIRone (BUSPAR) 10 MG tablet TAKE ONE TABLET BY MOUTH TWICE DAILY 180 tablet 3  . cetirizine (ZYRTEC) 10 MG tablet Take 1 tablet (10 mg total) by mouth daily. 30 tablet 11  . EPINEPHrine (EPIPEN) 0.3 mg/0.3 mL IJ SOAJ injection Inject 0.3 mLs (0.3 mg total) into the muscle once. 2 Device 2  . ibuprofen (ADVIL,MOTRIN) 200 MG tablet Take 200 mg by mouth every 6 (six) hours as needed for pain. Takes two daily    . lovastatin (MEVACOR) 40 MG tablet Take 1 tablet (40 mg total) by mouth at bedtime. 90 tablet 3  . pantoprazole (PROTONIX) 40 MG tablet TAKE 1 TABLET BY MOUTH ONCE A DAY 30 MINUTES BEFORE A MEAL 90 tablet 1  . tamsulosin (FLOMAX) 0.4 MG CAPS capsule Take 0.4 mg by mouth daily.    . traMADol (  ULTRAM) 50 MG tablet TAKE 1/2 TABLET BY MOUTH TWICE A DAY AS NEEDED FOR PAIN 30 tablet 5   No current facility-administered medications on file prior to visit.   BP has been elev mild several times recently,  Has been seeing dr Risa Grill, had some chronic dysuria that did not reposnd to tx, but now fortuntely resolved,   BUt still ahving ED, asks for generic viagra  stil having some swallow problem despite egd with dilation.  Past Medical History:  Diagnosis Date  . Allergic rhinitis   . Anxiety   . Chronic low back pain   . DDD (degenerative disc disease), lumbar   . ED (erectile dysfunction)   . Finger injury    cut pads off 3rd and 4th  finger left hand/due to lawn mower accident  . GERD (gastroesophageal reflux disease)   . Hyperlipidemia   . Incomplete right bundle branch block   . Kidney stones   . Prostate cancer ([place]) dx 10/02/14   stage T1c  . Prostate cancer ([place])   . Sigmoid diverticulosis   . Wears glasses    Past Surgical History:  Procedure Laterality Date  . COLONOSCOPY W/ POLYPECTOMY  04-19-2012  . ESOPHAGOGASTRODUODENOSCOPY  08-05-2010  . PROSTATE BIOPSY  10/02/14  . RADIOACTIVE SEED IMPLANT N/A 01/30/2015   Procedure: RADIOACTIVE SEED IMPLANT;  Surgeon: Rana Snare, MD;  Location: [facility];  Service: Urology;  Laterality: N/A;    reports that he has been smoking Cigarettes.  He has a 25.00 pack-year smoking history. He has never used smokeless tobacco. He reports that he drinks about 29.4 oz of alcohol per week . He reports that he does not use drugs. family history includes Breast cancer in his sister; Heart disease in his father and mother; Prostate cancer in his father. Allergies  Allergen Reactions  . Bee Venom    Current Outpatient Prescriptions on File Prior to Visit  Medication Sig Dispense Refill  . aspirin 81 MG tablet Take 81 mg by mouth daily.    . busPIRone (BUSPAR) 10 MG tablet TAKE ONE TABLET BY MOUTH TWICE DAILY 180 tablet 3  . cetirizine (ZYRTEC) 10 MG tablet Take 1 tablet (10 mg total) by mouth daily. 30 tablet 11  . EPINEPHrine (EPIPEN) 0.3 mg/0.3 mL IJ SOAJ injection Inject 0.3 mLs (0.3 mg total) into the muscle once. 2 Device 2  . ibuprofen (ADVIL,MOTRIN) 200 MG tablet Take 200 mg by mouth every 6 (six) hours as needed for pain. Takes two daily    . lovastatin (MEVACOR) 40 MG tablet Take 1 tablet (40 mg total) by mouth at bedtime. 90 tablet 3  . pantoprazole (PROTONIX) 40 MG tablet TAKE 1 TABLET BY MOUTH ONCE A DAY 30 MINUTES BEFORE A MEAL 90 tablet 1  . tamsulosin (FLOMAX) 0.4 MG CAPS capsule Take 0.4 mg by mouth daily.    . traMADol (ULTRAM) 50 MG tablet TAKE  1/2 TABLET BY MOUTH TWICE A DAY AS NEEDED FOR PAIN 30 tablet 5   No current facility-administered medications on file prior to visit.     Review of Systems Constitutional: Negative for increased diaphoresis, or other activity, appetite or siginficant weight change other than noted HENT: Negative for worsening hearing loss, ear pain, facial swelling, mouth sores and neck stiffness.   Eyes: Negative for other worsening pain, redness or visual disturbance.  Respiratory: Negative for choking or stridor Cardiovascular: Negative for other chest pain and palpitations.  Gastrointestinal: Negative for worsening diarrhea, blood in stool,  or abdominal distention Genitourinary: Negative for hematuria, flank pain or change in urine volume.  Musculoskeletal: Negative for myalgias or other joint complaints.  Skin: Negative for other color change and wound or drainage.  Neurological: Negative for syncope and numbness. other than noted Hematological: Negative for adenopathy. or other swelling Psychiatric/Behavioral: Negative for hallucinations, SI, self-injury, decreased concentration or other worsening agitation.      Objective:   Physical Exam BP 136/82   Pulse 77   Temp 98.3 F (36.8 C) (Oral)   Resp 20   Wt 164 lb 4 oz (74.5 kg)   SpO2 96%   BMI 22.28 kg/m  VS noted,  Constitutional: Pt is oriented to person, place, and time. Appears well-developed and well-nourished, in no significant distress Head: Normocephalic and atraumatic  Eyes: Conjunctivae and EOM are normal. Pupils are equal, round, and reactive to light Right Ear: External ear normal.  Left Ear: External ear normal Nose: Nose normal.  Mouth/Throat: Oropharynx is clear and moist  Neck: Normal range of motion. Neck supple. No JVD present. No tracheal deviation present or significant neck LA or mass Cardiovascular: Normal rate, regular rhythm, normal heart sounds and intact distal pulses.   Pulmonary/Chest: Effort normal and  breath sounds without rales or wheezing  Abdominal: Soft. Bowel sounds are normal. NT. No HSM  Musculoskeletal: Normal range of motion. Exhibits no edema Lymphadenopathy: Has no cervical adenopathy.  Neurological: Pt is alert and oriented to person, place, and time. Pt has normal reflexes. No cranial nerve deficit. Motor grossly intact Skin: Skin is warm and dry. No rash noted or new ulcers Psychiatric:  Has normal mood and affect. Behavior is normal.     Assessment & Plan:

## 2016-09-01 ENCOUNTER — Other Ambulatory Visit: Payer: Self-pay | Admitting: Internal Medicine

## 2016-09-04 ENCOUNTER — Other Ambulatory Visit: Payer: Self-pay | Admitting: Internal Medicine

## 2016-09-04 ENCOUNTER — Telehealth: Payer: Self-pay | Admitting: Internal Medicine

## 2016-09-04 NOTE — Telephone Encounter (Signed)
Pt called in and said that the busPIRone (BUSPAR) 10 MG tablet [[id number]] pt is stating that [facility] told him that he needs a PA for this med.  Can we start the process?

## 2016-09-08 NOTE — Telephone Encounter (Signed)
Please advise pt that Buspar was prescribed by Dr Carlean Purl therefore PA will have to be completed by his office

## 2016-09-09 ENCOUNTER — Other Ambulatory Visit: Payer: Self-pay | Admitting: Internal Medicine

## 2016-09-09 NOTE — Telephone Encounter (Signed)
Done hardcopy to Corinne  

## 2016-09-09 NOTE — Telephone Encounter (Signed)
faxed

## 2016-09-10 ENCOUNTER — Other Ambulatory Visit: Payer: Self-pay | Admitting: Internal Medicine

## 2016-09-10 NOTE — Telephone Encounter (Signed)
Done hardcopy to Corinne  

## 2016-09-11 NOTE — Telephone Encounter (Signed)
faxed

## 2016-09-16 ENCOUNTER — Other Ambulatory Visit: Payer: Self-pay | Admitting: Internal Medicine

## 2016-09-16 NOTE — Telephone Encounter (Signed)
OK to refill x 1 year I reviewed PCP note from last month

## 2016-09-16 NOTE — Telephone Encounter (Signed)
May I refill Sir? 

## 2016-11-06 ENCOUNTER — Other Ambulatory Visit: Payer: Self-pay | Admitting: Internal Medicine

## 2016-11-06 NOTE — Telephone Encounter (Signed)
Done hardcopy to Corinne  

## 2016-11-06 NOTE — Telephone Encounter (Signed)
This has been faxed.

## 2016-11-22 ENCOUNTER — Other Ambulatory Visit: Payer: Self-pay | Admitting: Internal Medicine

## 2017-02-22 ENCOUNTER — Other Ambulatory Visit: Payer: Self-pay | Admitting: Internal Medicine

## 2017-03-03 ENCOUNTER — Other Ambulatory Visit: Payer: Self-pay | Admitting: Internal Medicine

## 2017-03-04 NOTE — Telephone Encounter (Signed)
faxed

## 2017-03-04 NOTE — Telephone Encounter (Signed)
Done hardcopy to [place]  

## 2017-04-09 ENCOUNTER — Other Ambulatory Visit: Payer: Self-pay | Admitting: Internal Medicine

## 2017-04-13 NOTE — Telephone Encounter (Signed)
Faxed script back [facility]...Johny Chess

## 2017-05-27 ENCOUNTER — Other Ambulatory Visit: Payer: Self-pay | Admitting: Internal Medicine

## 2017-06-01 ENCOUNTER — Encounter: Payer: Self-pay | Admitting: Internal Medicine

## 2017-06-06 ENCOUNTER — Other Ambulatory Visit: Payer: Self-pay | Admitting: Internal Medicine

## 2017-07-06 ENCOUNTER — Telehealth: Payer: Self-pay | Admitting: Internal Medicine

## 2017-07-06 DIAGNOSIS — Z0000 Encounter for general adult medical examination without abnormal findings: Secondary | ICD-10-CM

## 2017-07-06 NOTE — Telephone Encounter (Signed)
Notified patient.

## 2017-07-06 NOTE — Telephone Encounter (Signed)
Patient has scheduled a CPE for 8/24.  He is requesting Dr. Jenny Reichmann to enter labs he can have done before appt.

## 2017-07-06 NOTE — Telephone Encounter (Signed)
Done

## 2017-07-13 ENCOUNTER — Other Ambulatory Visit (INDEPENDENT_AMBULATORY_CARE_PROVIDER_SITE_OTHER): Payer: BLUE CROSS/BLUE SHIELD

## 2017-07-13 DIAGNOSIS — Z0000 Encounter for general adult medical examination without abnormal findings: Secondary | ICD-10-CM | POA: Diagnosis not present

## 2017-07-13 LAB — BASIC METABOLIC PANEL
BUN: 9 mg/dL (ref 6–23)
CALCIUM: 9.2 mg/dL (ref 8.4–10.5)
CHLORIDE: 104 meq/L (ref 96–112)
CO2: 26 mEq/L (ref 19–32)
CREATININE: 0.61 mg/dL (ref 0.40–1.50)
GFR: 145.39 mL/min (ref >60.00)
Glucose, Bld: 100 mg/dL — ABNORMAL HIGH (ref 70–99)
Potassium: 4.3 mEq/L (ref 3.5–5.1)
Sodium: 139 mEq/L (ref 135–145)

## 2017-07-13 LAB — URINALYSIS, ROUTINE W REFLEX MICROSCOPIC
Bilirubin Urine: NEGATIVE
Hgb urine dipstick: NEGATIVE
LEUKOCYTES UA: NEGATIVE
NITRITE: NEGATIVE
RBC / HPF: NONE SEEN (ref 0–?)
Specific Gravity, Urine: 1.02 (ref 1.000–1.030)
TOTAL PROTEIN, URINE-UPE24: NEGATIVE
UROBILINOGEN UA: 0.2 (ref 0.0–1.0)
Urine Glucose: NEGATIVE
pH: 5.5 (ref 5.0–8.0)

## 2017-07-13 LAB — HEPATIC FUNCTION PANEL
ALT: 44 U/L (ref 0–53)
AST: 38 U/L — ABNORMAL HIGH (ref 0–37)
Albumin: 4.1 g/dL (ref 3.5–5.2)
Alkaline Phosphatase: 63 U/L (ref 39–117)
BILIRUBIN TOTAL: 0.4 mg/dL (ref 0.2–1.2)
Bilirubin, Direct: 0.1 mg/dL (ref 0.0–0.3)
Total Protein: 7.4 g/dL (ref 6.0–8.3)

## 2017-07-13 LAB — LIPID PANEL
Cholesterol: 157 mg/dL (ref 0–200)
HDL: 38.7 mg/dL — ABNORMAL LOW
LDL Cholesterol: 79 mg/dL (ref 0–99)
NonHDL: 118.58
Total CHOL/HDL Ratio: 4
Triglycerides: 196 mg/dL — ABNORMAL HIGH (ref 0.0–149.0)
VLDL: 39.2 mg/dL (ref 0.0–40.0)

## 2017-07-13 LAB — TSH: TSH: 1.41 u[IU]/mL (ref 0.35–4.50)

## 2017-07-13 LAB — PSA: PSA: 0.93 ng/mL (ref 0.10–4.00)

## 2017-07-16 ENCOUNTER — Ambulatory Visit (INDEPENDENT_AMBULATORY_CARE_PROVIDER_SITE_OTHER): Payer: BLUE CROSS/BLUE SHIELD | Admitting: Internal Medicine

## 2017-07-16 ENCOUNTER — Encounter: Payer: Self-pay | Admitting: Internal Medicine

## 2017-07-16 VITALS — BP 118/62 | HR 87 | Temp 98.7°F | Resp 16 | Ht 72.0 in | Wt 164.8 lb

## 2017-07-16 DIAGNOSIS — G8929 Other chronic pain: Secondary | ICD-10-CM

## 2017-07-16 DIAGNOSIS — F17200 Nicotine dependence, unspecified, uncomplicated: Secondary | ICD-10-CM

## 2017-07-16 DIAGNOSIS — M545 Low back pain: Secondary | ICD-10-CM | POA: Diagnosis not present

## 2017-07-16 DIAGNOSIS — R1310 Dysphagia, unspecified: Secondary | ICD-10-CM | POA: Diagnosis not present

## 2017-07-16 DIAGNOSIS — Z860100 Personal history of colon polyps, unspecified: Secondary | ICD-10-CM

## 2017-07-16 DIAGNOSIS — Z0001 Encounter for general adult medical examination with abnormal findings: Secondary | ICD-10-CM | POA: Diagnosis not present

## 2017-07-16 DIAGNOSIS — Z114 Encounter for screening for human immunodeficiency virus [HIV]: Secondary | ICD-10-CM | POA: Diagnosis not present

## 2017-07-16 DIAGNOSIS — K219 Gastro-esophageal reflux disease without esophagitis: Secondary | ICD-10-CM | POA: Diagnosis not present

## 2017-07-16 MED ORDER — ZOSTER VAC RECOMB ADJUVANTED 50 MCG/0.5ML IM SUSR: 0.5000 mL | Freq: Once | INTRAMUSCULAR | 1 refills | Status: AC

## 2017-07-16 NOTE — Patient Instructions (Addendum)
OK to try the protonix twice per day for 5 days and call if this seems to help  Please continue all other medications as before, and refills have been done if requested.  Please have the pharmacy call with any other refills you may need.  Please continue your efforts at being more active, low cholesterol diet, and weight control.  You are otherwise up to date with prevention measures today.  Please keep your appointments with your specialists as you may have planned  You will be contacted regarding the referral for: colonoscopy for after jan 1  Your shingrix prescription was sent to the pharmacy  Please return in 1 year for your yearly visit, or sooner if needed, with Lab testing done 3-5 days before

## 2017-07-16 NOTE — Progress Notes (Signed)
Subjective:    Patient ID: Richard Santos, male    DOB: 1961-10-08, 56 y.o.   MRN: [id number]  HPI  Here for wellness and f/u;  Overall doing ok;  Pt denies Chest pain, worsening SOB, DOE, wheezing, orthopnea, PND, worsening LE edema, palpitations, dizziness or syncope.  Pt denies neurological change such as new headache, facial or extremity weakness.  Pt denies polydipsia, polyuria, or low sugar symptoms. Pt states overall good compliance with treatment and medications, good tolerability, and has been trying to follow appropriate diet.  Pt denies worsening depressive symptoms, suicidal ideation or panic. No fever, night sweats, wt loss, loss of appetite, or other constitutional symptoms.  Pt states good ability with ADL's, has low fall risk, home safety reviewed and adequate, no other significant changes in hearing or vision, and only occasionally active with exercise. Due for shingrix, declines flu shot  Denies worsening reflux, abd pain, n/v, bowel change or blood but does have occasional persistent breakthrough reflux symptoms assoc with very mild dysphagia to solids, is s/p EGD with dilation, does not want this again for now.Pt continues to have recurring LBP without change in severity, bowel or bladder change, fever, wt loss,  worsening LE pain/numbness/weakness, gait change or falls. Past Medical History:  Diagnosis Date  . Allergic rhinitis   . Anxiety   . Chronic low back pain   . DDD (degenerative disc disease), lumbar   . ED (erectile dysfunction)   . Finger injury    cut pads off 3rd and 4th finger left hand/due to lawn mower accident  . GERD (gastroesophageal reflux disease)   . HTN (hypertension) 08/20/2016  . Hyperlipidemia   . Incomplete right bundle branch block   . Kidney stones   . Prostate cancer ([place]) dx 10/02/14   stage T1c  . Prostate cancer ([place])   . Sigmoid diverticulosis   . Wears glasses    Past Surgical History:  Procedure Laterality Date  . COLONOSCOPY W/  POLYPECTOMY  04-19-2012  . ESOPHAGOGASTRODUODENOSCOPY  08-05-2010  . PROSTATE BIOPSY  10/02/14  . RADIOACTIVE SEED IMPLANT N/A 01/30/2015   Procedure: RADIOACTIVE SEED IMPLANT;  Surgeon: Rana Snare, MD;  Location: [facility];  Service: Urology;  Laterality: N/A;    reports that he has been smoking Cigarettes.  He has a 25.00 pack-year smoking history. He has never used smokeless tobacco. He reports that he drinks about 29.4 oz of alcohol per week . He reports that he does not use drugs. family history includes Breast cancer in his sister; Heart disease in his father, mother, and unknown relative; Prostate cancer in his father. Allergies  Allergen Reactions  . Bee Venom    Current Outpatient Prescriptions on File Prior to Visit  Medication Sig Dispense Refill  . aspirin 81 MG tablet Take 81 mg by mouth daily.    . busPIRone (BUSPAR) 10 MG tablet TAKE 1 TABLET BY MOUTH TWICE A DAY 180 tablet 3  . cetirizine (ZYRTEC) 10 MG tablet Take 1 tablet (10 mg total) by mouth daily. 30 tablet 11  . EPINEPHrine (EPIPEN) 0.3 mg/0.3 mL IJ SOAJ injection Inject 0.3 mLs (0.3 mg total) into the muscle once. 2 Device 2  . ibuprofen (ADVIL,MOTRIN) 200 MG tablet Take 200 mg by mouth every 6 (six) hours as needed for pain. Takes two daily    . losartan (COZAAR) 25 MG tablet Take 1 tablet (25 mg total) by mouth daily. 90 tablet 3  . lovastatin (MEVACOR) 40 MG tablet  Take 1 tablet (40 mg total) by mouth at bedtime. Annual appt w/labs due in Sept must see MD for future refills 90 tablet 0  . pantoprazole (PROTONIX) 40 MG tablet TAKE 1 TABLET BY MOUTH ONCE A DAY 30 MINUTES BEFORE A MEAL 90 tablet 0  . sildenafil (REVATIO) 20 MG tablet Take 3-5 tabs by mouth daily as needed 60 tablet 11  . tamsulosin (FLOMAX) 0.4 MG CAPS capsule Take 0.4 mg by mouth daily.    . traMADol (ULTRAM) 50 MG tablet TAKE ONE-HALF TABLET BY MOUTH TWICE DAILY AS NEEDED FOR PAIN 60 tablet 1   No current facility-administered  medications on file prior to visit.    Review of Systems Constitutional: Negative for other unusual diaphoresis, sweats, appetite or weight changes HENT: Negative for other worsening hearing loss, ear pain, facial swelling, mouth sores or neck stiffness.   Eyes: Negative for other worsening pain, redness or other visual disturbance.  Respiratory: Negative for other stridor or swelling Cardiovascular: Negative for other palpitations or other chest pain  Gastrointestinal: Negative for worsening diarrhea or loose stools, blood in stool, distention or other pain Genitourinary: Negative for hematuria, flank pain or other change in urine volume.  Musculoskeletal: Negative for myalgias or other joint swelling.  Skin: Negative for other color change, or other wound or worsening drainage.  Neurological: Negative for other syncope or numbness. Hematological: Negative for other adenopathy or swelling Psychiatric/Behavioral: Negative for hallucinations, other worsening agitation, SI, self-injury, or new decreased concentration All other system neg per pt    Objective:   Physical Exam BP 118/62 (BP Location: Left Arm, Patient Position: Sitting, Cuff Size: Large)   Pulse 87   Temp 98.7 F (37.1 C) (Oral)   Resp 16   Ht 6' (1.829 m)   Wt 164 lb 12.8 oz (74.8 kg)   SpO2 97%   BMI 22.35 kg/m  VS noted,  Constitutional: Pt is oriented to person, place, and time. Appears well-developed and well-nourished, in no significant distress and comfortable Head: Normocephalic and atraumatic  Eyes: Conjunctivae and EOM are normal. Pupils are equal, round, and reactive to light Right Ear: External ear normal without discharge Left Ear: External ear normal without discharge Nose: Nose without discharge or deformity Mouth/Throat: Oropharynx is without other ulcerations and moist  Neck: Normal range of motion. Neck supple. No JVD present. No tracheal deviation present or significant neck LA or  mass Cardiovascular: Normal rate, regular rhythm, normal heart sounds and intact distal pulses.   Pulmonary/Chest: WOB normal and breath sounds without rales or wheezing  Abdominal: Soft. Bowel sounds are normal. NT. No HSM  Musculoskeletal: Normal range of motion. Exhibits no edema Lymphadenopathy: Has no other cervical adenopathy.  Neurological: Pt is alert and oriented to person, place, and time. Pt has normal reflexes. No cranial nerve deficit. Motor grossly intact, Gait intact Skin: Skin is warm and dry. No rash noted or new ulcerations Psychiatric:  Has normal mood and affect. Behavior is normal without agitation No other exam findings Lab Results  Component Value Date   WBC 6.7 08/13/2016   HGB 13.8 08/13/2016   HCT 39.2 08/13/2016   PLT 183.0 08/13/2016   GLUCOSE 100 (H) 07/13/2017   CHOL 157 07/13/2017   TRIG 196.0 (H) 07/13/2017   HDL 38.70 (L) 07/13/2017   LDLDIRECT 102.0 08/13/2016   LDLCALC 79 07/13/2017   ALT 44 07/13/2017   AST 38 (H) 07/13/2017   NA 139 07/13/2017   K 4.3 07/13/2017   CL 104  07/13/2017   CREATININE 0.61 07/13/2017   BUN 9 07/13/2017   CO2 26 07/13/2017   TSH 1.41 07/13/2017   PSA 0.93 07/13/2017   INR 0.96 01/23/2015   HGBA1C 5.6 08/13/2016        Assessment & Plan:

## 2017-07-17 DIAGNOSIS — R1310 Dysphagia, unspecified: Secondary | ICD-10-CM | POA: Insufficient documentation

## 2017-07-17 NOTE — Assessment & Plan Note (Signed)
Due for f/u colonoscopy, will refer

## 2017-07-17 NOTE — Assessment & Plan Note (Signed)
Counseled to quit, try OTC nicotine products, declines chantix

## 2017-07-17 NOTE — Assessment & Plan Note (Signed)

## 2017-07-17 NOTE — Assessment & Plan Note (Signed)
Stable, for med refill,  to f/u any worsening symptoms or concerns

## 2017-07-17 NOTE — Assessment & Plan Note (Signed)
Very mild without n/v, wt loss, blood or other symptoms, pt declines GI referral for now, will try increased PPI to start  Note:  Total time for pt hx, exam, review of record with pt in the room, determination of diagnoses and plan for further eval and tx is > 40 min, with over 50% spent in coordination and counseling of patient, including the differential dx, tx, further evaluation and management of dysphagia, GERD, hx of colon polyp, chronic LBP, and to quit smoking

## 2017-07-17 NOTE — Assessment & Plan Note (Signed)
[place] for increased PPI to bid trial for next 5 days, call if not improved for GI referral

## 2017-08-04 ENCOUNTER — Other Ambulatory Visit: Payer: Self-pay | Admitting: Internal Medicine

## 2017-08-04 NOTE — Telephone Encounter (Signed)
Done hardcopy to [place]  

## 2017-08-04 NOTE — Telephone Encounter (Signed)
faxed

## 2017-08-14 ENCOUNTER — Other Ambulatory Visit: Payer: Self-pay | Admitting: Internal Medicine

## 2017-09-04 ENCOUNTER — Other Ambulatory Visit: Payer: Self-pay | Admitting: Internal Medicine

## 2017-09-29 ENCOUNTER — Encounter: Payer: Self-pay | Admitting: Internal Medicine

## 2017-09-30 ENCOUNTER — Other Ambulatory Visit: Payer: Self-pay | Admitting: Internal Medicine

## 2017-10-01 NOTE — Telephone Encounter (Signed)
OK to refill x 13 He should either see me or ask PCP to refill in future - I suspect that would be ok unless he feels like he need to see me for GI f/u

## 2017-10-01 NOTE — Telephone Encounter (Signed)
Spoke with Richard Santos , updated his phone #'s and advised him as Dr Carlean Purl informed me to.  He verbalized understanding and said he will see [place] in January for his colonoscopy.

## 2017-10-01 NOTE — Telephone Encounter (Signed)
Refill

## 2017-11-08 ENCOUNTER — Other Ambulatory Visit: Payer: Self-pay | Admitting: Internal Medicine

## 2017-11-19 ENCOUNTER — Other Ambulatory Visit: Payer: Self-pay

## 2017-11-19 ENCOUNTER — Ambulatory Visit (AMBULATORY_SURGERY_CENTER): Payer: Self-pay | Admitting: *Deleted

## 2017-11-19 VITALS — Ht 72.0 in | Wt 165.0 lb

## 2017-11-19 DIAGNOSIS — Z86010 Personal history of colonic polyps: Secondary | ICD-10-CM

## 2017-11-19 NOTE — Progress Notes (Signed)
No egg or soy allergy known to patient  No issues with past sedation with any surgeries  or procedures, no intubation problems  No diet pills per patient No home 02 use per patient  No blood thinners per patient  Pt denies issues with constipation  No A fib or A flutter  EMMI video sent to pt's e mail pt declined   

## 2017-11-29 ENCOUNTER — Encounter: Payer: Self-pay | Admitting: Internal Medicine

## 2017-12-03 ENCOUNTER — Other Ambulatory Visit: Payer: Self-pay

## 2017-12-03 ENCOUNTER — Encounter: Payer: Self-pay | Admitting: Internal Medicine

## 2017-12-03 ENCOUNTER — Ambulatory Visit (AMBULATORY_SURGERY_CENTER): Payer: BLUE CROSS/BLUE SHIELD | Admitting: Internal Medicine

## 2017-12-03 VITALS — BP 123/80 | HR 70 | Temp 97.8°F | Resp 11 | Ht 72.0 in | Wt 165.0 lb

## 2017-12-03 DIAGNOSIS — D120 Benign neoplasm of cecum: Secondary | ICD-10-CM

## 2017-12-03 DIAGNOSIS — D122 Benign neoplasm of ascending colon: Secondary | ICD-10-CM

## 2017-12-03 DIAGNOSIS — Z86010 Personal history of colonic polyps: Secondary | ICD-10-CM

## 2017-12-03 MED ORDER — SODIUM CHLORIDE 0.9 % IV SOLN: 500.0000 mL | Freq: Once | INTRAVENOUS | Status: DC

## 2017-12-03 NOTE — Progress Notes (Signed)
To recovery, report to RN, VSS. 

## 2017-12-03 NOTE — Progress Notes (Signed)
Called to room to assist during endoscopic procedure.  Patient ID and intended procedure confirmed with present staff. Received instructions for my participation in the procedure from the performing physician.  

## 2017-12-03 NOTE — Patient Instructions (Addendum)
   I found and removed 2 polyps that look benign.  I will let you know pathology results and when to have another routine colonoscopy by mail and/or My Chart.  I appreciate the opportunity to care for you. Gatha Mayer, MD, [facility]   *Handout given to patient on polyps**  YOU HAD AN ENDOSCOPIC PROCEDURE TODAY AT [place]:   Refer to the procedure report that was given to you for any specific questions about what was found during the examination.  If the procedure report does not answer your questions, please call your gastroenterologist to clarify.  If you requested that your care partner not be given the details of your procedure findings, then the procedure report has been included in a sealed envelope for you to review at your convenience later.  YOU SHOULD EXPECT: Some feelings of bloating in the abdomen. Passage of more gas than usual.  Walking can help get rid of the air that was put into your GI tract during the procedure and reduce the bloating. If you had a lower endoscopy (such as a colonoscopy or flexible sigmoidoscopy) you may notice spotting of blood in your stool or on the toilet paper. If you underwent a bowel prep for your procedure, you may not have a normal bowel movement for a few days.  Please Note:  You might notice some irritation and congestion in your nose or some drainage.  This is from the oxygen used during your procedure.  There is no need for concern and it should clear up in a day or so.  SYMPTOMS TO REPORT IMMEDIATELY:   Following lower endoscopy (colonoscopy or flexible sigmoidoscopy):  Excessive amounts of blood in the stool  Significant tenderness or worsening of abdominal pains  Swelling of the abdomen that is new, acute  Fever of 100F or higher   For urgent or emergent issues, a gastroenterologist can be reached at any hour by calling [phone].   DIET:  We do recommend a small meal at first, but then you may proceed to  your regular diet.  Drink plenty of fluids but you should avoid alcoholic beverages for 24 hours.  ACTIVITY:  You should plan to take it easy for the rest of today and you should NOT DRIVE or use heavy machinery until tomorrow (because of the sedation medicines used during the test).    FOLLOW UP: Our staff will call the number listed on your records the next business day following your procedure to check on you and address any questions or concerns that you may have regarding the information given to you following your procedure. If we do not reach you, we will leave a message.  However, if you are feeling well and you are not experiencing any problems, there is no need to return our call.  We will assume that you have returned to your regular daily activities without incident.  If any biopsies were taken you will be contacted by phone or by letter within the next 1-3 weeks.  Please call us at [phone] if you have not heard about the biopsies in 3 weeks.    SIGNATURES/CONFIDENTIALITY: You and/or your care partner have signed paperwork which will be entered into your electronic medical record.  These signatures attest to the fact that that the information above on your After Visit Summary has been reviewed and is understood.  Full responsibility of the confidentiality of this discharge information lies with you and/or your care-partner.

## 2017-12-03 NOTE — Progress Notes (Deleted)
Called to room to assist during endoscopic procedure.  Patient ID and intended procedure confirmed with present staff. Received instructions for my participation in the procedure from the performing physician.  

## 2017-12-03 NOTE — Op Note (Addendum)
[facility] Patient Name: Sophia Cubero Procedure Date: 12/03/2017 8:40 AM MRN: [id number] Endoscopist: Gatha Mayer , MD Age: 57 Referring MD:  Date of Birth: 08/21/1961 Gender: Male Account #: [id number] Procedure:                Colonoscopy Indications:              Surveillance: Personal history of adenomatous                            polyps on last colonoscopy 5 years ago Medicines:                Propofol per Anesthesia, Monitored Anesthesia Care Procedure:                Pre-Anesthesia Assessment:                           - Prior to the procedure, a History and Physical                            was performed, and patient medications and                            allergies were reviewed. The patient's tolerance of                            previous anesthesia was also reviewed. The risks                            and benefits of the procedure and the sedation                            options and risks were discussed with the patient.                            All questions were answered, and informed consent                            was obtained. Prior Anticoagulants: The patient has                            taken no previous anticoagulant or antiplatelet                            agents. ASA Grade Assessment: II - A patient with                            mild systemic disease. After reviewing the risks                            and benefits, the patient was deemed in                            satisfactory condition to undergo the procedure.  After obtaining informed consent, the colonoscope                            was passed under direct vision. Throughout the                            procedure, the patient's blood pressure, pulse, and                            oxygen saturations were monitored continuously. The                            Colonoscope was introduced through the anus and   advanced to the the cecum, identified by                            appendiceal orifice and ileocecal valve. The                            colonoscopy was performed without difficulty. The                            patient tolerated the procedure well. The quality                            of the bowel preparation was excellent. The                            ileocecal valve, appendiceal orifice, and rectum                            were photographed. Scope In: 8:51:23 AM Scope Out: 9:05:39 AM Scope Withdrawal Time: 0 hours 11 minutes 19 seconds  Total Procedure Duration: 0 hours 14 minutes 16 seconds  Findings:                 The perianal and digital rectal examinations were                            normal. Pertinent negatives include normal prostate                            (size, shape, and consistency).                           Two flat polyps were found in the ascending colon                            and cecum. The polyps were 2 to 7 mm in size. These                            polyps were removed with a cold snare. Resection                            and retrieval  were complete. Verification of                            patient identification for the specimen was done.                            Estimated blood loss was minimal.                           Multiple diverticula were found in the sigmoid                            colon.                           The exam was otherwise without abnormality on                            direct and retroflexion views. Complications:            No immediate complications. Estimated blood loss:                            Minimal. Estimated Blood Loss:     Estimated blood loss was minimal. Impression:               - Two 2 to 7 mm polyps in the ascending colon and                            in the cecum, removed with a cold snare. Resected                            and retrieved.                           - Diverticulosis in  the sigmoid colon.                           - The examination was otherwise normal on direct                            and retroflexion views. Recommendation:           - Patient has a contact number available for                            emergencies. The signs and symptoms of potential                            delayed complications were discussed with the                            patient. Return to normal activities tomorrow.                            Written discharge instructions were provided to the  patient.                           - Resume previous diet.                           - Continue present medications.                           - Repeat colonoscopy is recommended for                            surveillance. The colonoscopy date will be                            determined after pathology results from today's                            exam become available for review. Gatha Mayer, MD 12/03/2017 9:17:53 AM This report has been signed electronically.

## 2017-12-03 NOTE — Progress Notes (Signed)
Patient consents to observer being present for procedure.   

## 2017-12-03 NOTE — Progress Notes (Signed)
Pt's states no medical or surgical changes since previsit or office visit. 

## 2017-12-06 ENCOUNTER — Telehealth: Payer: Self-pay | Admitting: *Deleted

## 2017-12-06 NOTE — Telephone Encounter (Signed)
  Follow up Call-  Call back number 12/03/2017 08/07/2015  Post procedure Call Back phone  # [phone]  Permission to leave phone message Yes Yes  Some recent data might be hidden     Patient questions:  Do you have a fever, pain , or abdominal swelling? No. Pain Score  0 *  Have you tolerated food without any problems? Yes.    Have you been able to return to your normal activities? Yes.    Do you have any questions about your discharge instructions: Diet   No. Medications  No. Follow up visit  No.  Do you have questions or concerns about your Care? No.  Actions: * If pain score is 4 or above: No action needed, pain <4.

## 2017-12-07 ENCOUNTER — Encounter: Payer: Self-pay | Admitting: Internal Medicine

## 2017-12-07 DIAGNOSIS — Z86010 Personal history of colonic polyps: Secondary | ICD-10-CM

## 2017-12-07 NOTE — Progress Notes (Signed)
2 adenomas < 1 cm Recall 2024

## 2017-12-16 ENCOUNTER — Other Ambulatory Visit: Payer: Self-pay | Admitting: Internal Medicine

## 2017-12-17 NOTE — Telephone Encounter (Signed)
Done erx 

## 2018-02-01 ENCOUNTER — Other Ambulatory Visit: Payer: Self-pay | Admitting: Internal Medicine

## 2018-04-13 ENCOUNTER — Other Ambulatory Visit: Payer: Self-pay | Admitting: Internal Medicine

## 2018-04-14 NOTE — Telephone Encounter (Signed)
Done erx 

## 2018-04-14 NOTE — Telephone Encounter (Signed)
02/15/2018 60# 

## 2018-04-26 ENCOUNTER — Other Ambulatory Visit: Payer: Self-pay | Admitting: Internal Medicine

## 2018-08-03 ENCOUNTER — Other Ambulatory Visit: Payer: Self-pay | Admitting: Internal Medicine

## 2018-08-07 ENCOUNTER — Other Ambulatory Visit: Payer: Self-pay | Admitting: Internal Medicine

## 2018-08-08 NOTE — Telephone Encounter (Signed)
Dr Carlean Purl patient is requesting refills of Buspar, She has not actually been seen in the office since 2016 but had a procedure in January  Do you want to continue to give refills?

## 2018-08-10 ENCOUNTER — Encounter: Payer: Self-pay | Admitting: Internal Medicine

## 2018-08-10 ENCOUNTER — Other Ambulatory Visit (INDEPENDENT_AMBULATORY_CARE_PROVIDER_SITE_OTHER): Payer: BLUE CROSS/BLUE SHIELD

## 2018-08-10 ENCOUNTER — Ambulatory Visit (INDEPENDENT_AMBULATORY_CARE_PROVIDER_SITE_OTHER): Payer: BLUE CROSS/BLUE SHIELD | Admitting: Internal Medicine

## 2018-08-10 VITALS — BP 112/76 | HR 92 | Temp 98.2°F | Ht 72.0 in | Wt 163.0 lb

## 2018-08-10 DIAGNOSIS — Z0000 Encounter for general adult medical examination without abnormal findings: Secondary | ICD-10-CM

## 2018-08-10 DIAGNOSIS — R7302 Impaired glucose tolerance (oral): Secondary | ICD-10-CM

## 2018-08-10 DIAGNOSIS — F1010 Alcohol abuse, uncomplicated: Secondary | ICD-10-CM

## 2018-08-10 LAB — BASIC METABOLIC PANEL
BUN: 9 mg/dL (ref 6–23)
CALCIUM: 9.7 mg/dL (ref 8.4–10.5)
CO2: 30 meq/L (ref 19–32)
Chloride: 104 mEq/L (ref 96–112)
Creatinine, Ser: 0.61 mg/dL (ref 0.40–1.50)
GFR: 144.82 mL/min (ref >60.00)
Glucose, Bld: 84 mg/dL (ref 70–99)
Potassium: 4.7 mEq/L (ref 3.5–5.1)
Sodium: 140 mEq/L (ref 135–145)

## 2018-08-10 LAB — CBC WITH DIFFERENTIAL/PLATELET
Basophils Absolute: 0 10*3/uL (ref 0.0–0.1)
Basophils Relative: 0.9 % (ref 0.0–3.0)
Eosinophils Absolute: 0.3 10*3/uL (ref 0.0–0.7)
Eosinophils Relative: 7 % — ABNORMAL HIGH (ref 0.0–5.0)
HCT: 41 % (ref 39.0–52.0)
Hemoglobin: 14.6 g/dL (ref 13.0–17.0)
Lymphocytes Relative: 25 % (ref 12.0–46.0)
Lymphs Abs: 1.2 10*3/uL (ref 0.7–4.0)
MCHC: 35.5 g/dL (ref 30.0–36.0)
MCV: 98.1 fl (ref 78.0–100.0)
Monocytes Absolute: 0.4 10*3/uL (ref 0.1–1.0)
Monocytes Relative: 7.7 % (ref 3.0–12.0)
Neutro Abs: 2.9 10*3/uL (ref 1.4–7.7)
Neutrophils Relative %: 59.4 % (ref 43.0–77.0)
Platelets: 204 10*3/uL (ref 150.0–400.0)
RBC: 4.17 Mil/uL — ABNORMAL LOW (ref 4.22–5.81)
RDW: 13.4 % (ref 11.5–15.5)
WBC: 4.9 10*3/uL (ref 4.0–10.5)

## 2018-08-10 LAB — URINALYSIS, ROUTINE W REFLEX MICROSCOPIC
Hgb urine dipstick: NEGATIVE
Leukocytes, UA: NEGATIVE
NITRITE: NEGATIVE
PH: 6 (ref 5.0–8.0)
RBC / HPF: NONE SEEN (ref 0–?)
SPECIFIC GRAVITY, URINE: 1.025 (ref 1.000–1.030)
Total Protein, Urine: NEGATIVE
URINE GLUCOSE: NEGATIVE
Urobilinogen, UA: 0.2 (ref 0.0–1.0)

## 2018-08-10 LAB — HEMOGLOBIN A1C: HEMOGLOBIN A1C: 5.6 % (ref 4.6–6.5)

## 2018-08-10 LAB — TSH: TSH: 1.56 u[IU]/mL (ref 0.35–4.50)

## 2018-08-10 LAB — LIPID PANEL
CHOL/HDL RATIO: 4
CHOLESTEROL: 179 mg/dL (ref 0–200)
HDL: 46.6 mg/dL (ref >39.00)
LDL Cholesterol: 104 mg/dL — ABNORMAL HIGH (ref 0–99)
NonHDL: 132.81
TRIGLYCERIDES: 146 mg/dL (ref 0.0–149.0)
VLDL: 29.2 mg/dL (ref 0.0–40.0)

## 2018-08-10 LAB — PSA: PSA: 0.27 ng/mL (ref 0.10–4.00)

## 2018-08-10 LAB — HEPATIC FUNCTION PANEL
ALBUMIN: 4.7 g/dL (ref 3.5–5.2)
ALT: 53 U/L (ref 0–53)
AST: 55 U/L — AB (ref 0–37)
Alkaline Phosphatase: 61 U/L (ref 39–117)
BILIRUBIN DIRECT: 0.1 mg/dL (ref 0.0–0.3)
TOTAL PROTEIN: 7.4 g/dL (ref 6.0–8.3)
Total Bilirubin: 0.5 mg/dL (ref 0.2–1.2)

## 2018-08-10 MED ORDER — ZOSTER VAC RECOMB ADJUVANTED 50 MCG/0.5ML IM SUSR: 0.5000 mL | Freq: Once | INTRAMUSCULAR | 1 refills | Status: AC

## 2018-08-10 NOTE — Assessment & Plan Note (Signed)
stable overall by history and exam, recent data reviewed with pt, and pt to continue medical treatment as before,  to f/u any worsening symptoms or concerns  

## 2018-08-10 NOTE — Assessment & Plan Note (Signed)
Urged to quit 

## 2018-08-10 NOTE — Progress Notes (Signed)
Subjective:    Patient ID: Richard Santos, male    DOB: 1961/01/18, 57 y.o.   MRN: [id number]  HPI  Here for wellness and f/u;  Overall doing ok;  Pt denies Chest pain, worsening SOB, DOE, wheezing, orthopnea, PND, worsening LE edema, palpitations, dizziness or syncope.  Pt denies neurological change such as new headache, facial or extremity weakness.  Pt denies polydipsia, polyuria, or low sugar symptoms. Pt states overall good compliance with treatment and medications, good tolerability, and has been trying to follow appropriate diet.  Pt denies worsening depressive symptoms, suicidal ideation or panic. No fever, night sweats, wt loss, loss of appetite, or other constitutional symptoms.  Pt states good ability with ADL's, has low fall risk, home safety reviewed and adequate, no other significant changes in hearing or vision, and only occasionally active with exercise. BP Readings from Last 3 Encounters:  08/10/18 112/76  12/03/17 123/80  07/16/17 118/62   Wt Readings from Last 3 Encounters:  08/10/18 163 lb (73.9 kg)  12/03/17 165 lb (74.8 kg)  11/19/17 165 lb (74.8 kg)  c/o persistent swallow dysfxn, bloating and epigastric discomfort, s/p EGD with dilation, overall not worse but did have vomiting after eating too fast a few times in the past 2 yrs  No trouble with liquids.   Good compliance with meds including PPI and declines GI referral to consider repeat EGD.  Still drinking 4-5 ETOH beers per day.   No other new complaints Past Medical History:  Diagnosis Date  . Allergic rhinitis   . Allergy   . Anxiety   . Chronic low back pain   . DDD (degenerative disc disease), lumbar   . ED (erectile dysfunction)   . Finger injury    cut pads off 3rd and 4th finger left hand/due to lawn mower accident  . GERD (gastroesophageal reflux disease)   . Heart murmur    as child only-   . HTN (hypertension) 08/20/2016  . Hyperlipidemia   . Incomplete right bundle branch block   . Kidney stones     . Prostate cancer ([place]) dx 10/02/14   stage T1c  . Prostate cancer ([place])   . Sigmoid diverticulosis   . Wears glasses    Past Surgical History:  Procedure Laterality Date  . COLONOSCOPY    . COLONOSCOPY W/ POLYPECTOMY  04-19-2012  . ESOPHAGOGASTRODUODENOSCOPY  08-05-2010  . POLYPECTOMY    . PROSTATE BIOPSY  10/02/14  . RADIOACTIVE SEED IMPLANT N/A 01/30/2015   Procedure: RADIOACTIVE SEED IMPLANT;  Surgeon: Rana Snare, MD;  Location: [facility];  Service: Urology;  Laterality: N/A;  . UPPER GASTROINTESTINAL ENDOSCOPY      reports that he has been smoking cigarettes. He has a 25.00 pack-year smoking history. He has never used smokeless tobacco. He reports that he drinks about 49.0 standard drinks of alcohol per week. He reports that he does not use drugs. family history includes Breast cancer in his sister; Heart disease in his father, mother, and unknown relative; Prostate cancer in his father. Allergies  Allergen Reactions  . Bee Venom    Current Outpatient Medications on File Prior to Visit  Medication Sig Dispense Refill  . aspirin 81 MG tablet Take 81 mg by mouth daily.    . busPIRone (BUSPAR) 10 MG tablet TAKE 1 TABLET BY MOUTH TWICE A DAY 180 tablet 3  . cetirizine (ZYRTEC) 10 MG tablet Take 1 tablet (10 mg total) by mouth daily. 30 tablet 11  .  EPINEPHrine (EPIPEN) 0.3 mg/0.3 mL IJ SOAJ injection Inject 0.3 mLs (0.3 mg total) into the muscle once. 2 Device 2  . ibuprofen (ADVIL,MOTRIN) 200 MG tablet Take 200 mg by mouth every 6 (six) hours as needed for pain. Takes two daily    . losartan (COZAAR) 25 MG tablet TAKE 1 TABLET (25 MG TOTAL) BY MOUTH DAILY. 90 tablet 3  . lovastatin (MEVACOR) 40 MG tablet TAKE 1 TABLET (40 MG TOTAL) BY MOUTH AT BEDTIME 90 tablet 3  . pantoprazole (PROTONIX) 40 MG tablet Take 1 tablet (40 mg total) by mouth daily before breakfast. 90 tablet 3  . sildenafil (REVATIO) 20 MG tablet Take 3-5 tabs by mouth daily as needed 60 tablet 11   . tamsulosin (FLOMAX) 0.4 MG CAPS capsule Take 0.4 mg by mouth daily.    . traMADol (ULTRAM) 50 MG tablet TAKE HALF TABLET BY MOUTH 2 TIMES DAILY AS NEEDED FOR PAIN 60 tablet 1   No current facility-administered medications on file prior to visit.    Review of Systems Constitutional: Negative for other unusual diaphoresis, sweats, appetite or weight changes HENT: Negative for other worsening hearing loss, ear pain, facial swelling, mouth sores or neck stiffness.   Eyes: Negative for other worsening pain, redness or other visual disturbance.  Respiratory: Negative for other stridor or swelling Cardiovascular: Negative for other palpitations or other chest pain  Gastrointestinal: Negative for worsening diarrhea or loose stools, blood in stool, distention or other pain Genitourinary: Negative for hematuria, flank pain or other change in urine volume.  Musculoskeletal: Negative for myalgias or other joint swelling.  Skin: Negative for other color change, or other wound or worsening drainage.  Neurological: Negative for other syncope or numbness. Hematological: Negative for other adenopathy or swelling Psychiatric/Behavioral: Negative for hallucinations, other worsening agitation, SI, self-injury, or new decreased concentration All other system neg per pt    Objective:   Physical Exam BP 112/76   Pulse 92   Temp 98.2 F (36.8 C) (Oral)   Ht 6' (1.829 m)   Wt 163 lb (73.9 kg)   SpO2 96%   BMI 22.11 kg/m  VS noted,  Constitutional: Pt is oriented to person, place, and time. Appears well-developed and well-nourished, in no significant distress and comfortable Head: Normocephalic and atraumatic  Eyes: Conjunctivae and EOM are normal. Pupils are equal, round, and reactive to light Right Ear: External ear normal without discharge Left Ear: External ear normal without discharge Nose: Nose without discharge or deformity Mouth/Throat: Oropharynx is without other ulcerations and moist   Neck: Normal range of motion. Neck supple. No JVD present. No tracheal deviation present or significant neck LA or mass Cardiovascular: Normal rate, regular rhythm, normal heart sounds and intact distal pulses.   Pulmonary/Chest: WOB normal and breath sounds without rales or wheezing  Abdominal: Soft. Bowel sounds are normal. NT. No HSM  Musculoskeletal: Normal range of motion. Exhibits no edema Lymphadenopathy: Has no other cervical adenopathy.  Neurological: Pt is alert and oriented to person, place, and time. Pt has normal reflexes. No cranial nerve deficit. Motor grossly intact, Gait intact Skin: Skin is warm and dry. No rash noted or new ulcerations Psychiatric:  Has normal mood and affect. Behavior is normal without agitation No other exam findings  Lab Results  Component Value Date   WBC 6.7 08/13/2016   HGB 13.8 08/13/2016   HCT 39.2 08/13/2016   PLT 183.0 08/13/2016   GLUCOSE 100 (H) 07/13/2017   CHOL 157 07/13/2017   TRIG  196.0 (H) 07/13/2017   HDL 38.70 (L) 07/13/2017   LDLDIRECT 102.0 08/13/2016   LDLCALC 79 07/13/2017   ALT 44 07/13/2017   AST 38 (H) 07/13/2017   NA 139 07/13/2017   K 4.3 07/13/2017   CL 104 07/13/2017   CREATININE 0.61 07/13/2017   BUN 9 07/13/2017   CO2 26 07/13/2017   TSH 1.41 07/13/2017   PSA 0.93 07/13/2017   INR 0.96 01/23/2015   HGBA1C 5.6 08/13/2016       Assessment & Plan:

## 2018-08-10 NOTE — Assessment & Plan Note (Signed)

## 2018-08-10 NOTE — Patient Instructions (Addendum)
Your [facility]les [facility]  Please continue all other medications as before, and refills have been done if requested.  Please have the pharmacy call with any other refills you may need.  Please continue your efforts at being more active, low cholesterol diet, and weight control.  You are otherwise up to date with prevention measures today.  Please keep your appointments with your specialists as you may have planned  Please go to the LAB in the Basement (turn left off the elevator) for the tests to be done today  You will be contacted by phone if any changes need to be made immediately.  Otherwise, you will receive a letter about your results with an explanation, but please check with MyChart first.  Please remember to sign up for MyChart if you have not done so, as this will be important to you in the future with finding out test results, communicating by private email, and scheduling acute appointments online when needed.  Please return in 1 year for your yearly visit, or sooner if needed, with Lab testing done 3-5 days before

## 2018-08-11 NOTE — Telephone Encounter (Signed)
We can do 6 mos

## 2018-08-18 ENCOUNTER — Telehealth: Payer: Self-pay | Admitting: Internal Medicine

## 2018-08-18 MED ORDER — LOSARTAN POTASSIUM 25 MG PO TABS: 25.0000 mg | ORAL_TABLET | Freq: Every day | ORAL | 3 refills | Status: DC

## 2018-08-18 NOTE — Telephone Encounter (Signed)
Copied from [facility] [id number]. Topic: Quick Communication - Rx Refill/Question >> Aug 18, 2018 10:09 AM Alfredia Ferguson R wrote: Medication: losartan (COZAAR) 25 MG tablet  Has the patient contacted their pharmacy? Yes Preferred Pharmacy (with phone number or street name): [id number] [place], [place] [phone] (Phone) [phone] (Fax)    Agent: Please be advised that RX refills may take up to 3 business days. We ask that you follow-up with your pharmacy.

## 2018-08-25 ENCOUNTER — Other Ambulatory Visit: Payer: Self-pay | Admitting: Internal Medicine

## 2018-08-25 NOTE — Telephone Encounter (Signed)
   LOV: 08/10/18 NextOV: not scheduled Last Filled/Quantity: 06/21/18 60#

## 2018-09-06 ENCOUNTER — Other Ambulatory Visit: Payer: Self-pay | Admitting: Internal Medicine

## 2019-01-05 ENCOUNTER — Other Ambulatory Visit: Payer: Self-pay | Admitting: Internal Medicine

## 2019-01-06 NOTE — Telephone Encounter (Signed)
Done erx 

## 2019-03-10 ENCOUNTER — Other Ambulatory Visit: Payer: Self-pay | Admitting: Internal Medicine

## 2019-04-16 ENCOUNTER — Other Ambulatory Visit: Payer: Self-pay | Admitting: Internal Medicine

## 2019-04-25 ENCOUNTER — Other Ambulatory Visit: Payer: Self-pay

## 2019-04-25 MED ORDER — PANTOPRAZOLE SODIUM 40 MG PO TBEC: 40.0000 mg | DELAYED_RELEASE_TABLET | Freq: Every day | ORAL | 3 refills | Status: DC

## 2019-04-25 NOTE — Telephone Encounter (Signed)
Pantoprazole refilled as pharmacy requested. 

## 2019-08-09 ENCOUNTER — Other Ambulatory Visit: Payer: Self-pay | Admitting: Internal Medicine

## 2019-08-09 ENCOUNTER — Telehealth: Payer: Self-pay

## 2019-08-09 MED ORDER — BUSPIRONE HCL 10 MG PO TABS: 10.0000 mg | ORAL_TABLET | Freq: Two times a day (BID) | ORAL | 0 refills | Status: DC

## 2019-08-09 NOTE — Telephone Encounter (Signed)
Refill x 2 mos  He needs a f/u visit also

## 2019-08-09 NOTE — Telephone Encounter (Signed)
Pharmacy requesting refill on her buspirone. She was seen 11/2017 and he has a physical with Dr Jenny Reichmann next week. Please advise Sir, thank you.

## 2019-08-09 NOTE — Telephone Encounter (Signed)
I have refilled his buspirone and called and left him a detailed voice mail to call us and set up an appointment.

## 2019-08-16 ENCOUNTER — Encounter: Payer: Self-pay | Admitting: Internal Medicine

## 2019-08-16 ENCOUNTER — Ambulatory Visit (INDEPENDENT_AMBULATORY_CARE_PROVIDER_SITE_OTHER): Payer: BLUE CROSS/BLUE SHIELD | Admitting: Internal Medicine

## 2019-08-16 ENCOUNTER — Other Ambulatory Visit: Payer: Self-pay

## 2019-08-16 VITALS — BP 132/84 | HR 100 | Temp 98.3°F | Ht 72.0 in | Wt 167.0 lb

## 2019-08-16 DIAGNOSIS — I10 Essential (primary) hypertension: Secondary | ICD-10-CM

## 2019-08-16 DIAGNOSIS — E559 Vitamin D deficiency, unspecified: Secondary | ICD-10-CM

## 2019-08-16 DIAGNOSIS — E538 Deficiency of other specified B group vitamins: Secondary | ICD-10-CM

## 2019-08-16 DIAGNOSIS — Z114 Encounter for screening for human immunodeficiency virus [HIV]: Secondary | ICD-10-CM | POA: Diagnosis not present

## 2019-08-16 DIAGNOSIS — E785 Hyperlipidemia, unspecified: Secondary | ICD-10-CM

## 2019-08-16 DIAGNOSIS — R7302 Impaired glucose tolerance (oral): Secondary | ICD-10-CM | POA: Diagnosis not present

## 2019-08-16 DIAGNOSIS — E611 Iron deficiency: Secondary | ICD-10-CM

## 2019-08-16 MED ORDER — CETIRIZINE HCL 10 MG PO TABS: 10.0000 mg | ORAL_TABLET | Freq: Every day | ORAL | 3 refills | Status: DC

## 2019-08-16 MED ORDER — LOVASTATIN 40 MG PO TABS: ORAL_TABLET | ORAL | 3 refills | Status: DC

## 2019-08-16 MED ORDER — BUSPIRONE HCL 10 MG PO TABS: 10.0000 mg | ORAL_TABLET | Freq: Two times a day (BID) | ORAL | 3 refills | Status: DC

## 2019-08-16 MED ORDER — SILDENAFIL CITRATE 20 MG PO TABS: ORAL_TABLET | ORAL | 11 refills | Status: DC

## 2019-08-16 MED ORDER — LOSARTAN POTASSIUM 25 MG PO TABS: 25.0000 mg | ORAL_TABLET | Freq: Every day | ORAL | 3 refills | Status: DC

## 2019-08-16 MED ORDER — PANTOPRAZOLE SODIUM 40 MG PO TBEC: 40.0000 mg | DELAYED_RELEASE_TABLET | Freq: Every day | ORAL | 3 refills | Status: DC

## 2019-08-16 MED ORDER — TRAMADOL HCL 50 MG PO TABS: ORAL_TABLET | ORAL | 1 refills | Status: DC

## 2019-08-16 MED ORDER — TAMSULOSIN HCL 0.4 MG PO CAPS: 0.4000 mg | ORAL_CAPSULE | Freq: Every day | ORAL | 3 refills | Status: DC

## 2019-08-16 NOTE — Assessment & Plan Note (Signed)
stable overall by history and exam, recent data reviewed with pt, and pt to continue medical treatment as before,  to f/u any worsening symptoms or concerns  

## 2019-08-16 NOTE — Patient Instructions (Signed)
Please continue all other medications as before, and refills have been done if requested.  Please have the pharmacy call with any other refills you may need.  Please continue your efforts at being more active, low cholesterol diet, and weight control.  You are otherwise up to date with prevention measures today.  Please keep your appointments with your specialists as you may have planned  Please return in 3 months, or sooner if needed, with Lab testing done 3-5 days before

## 2019-08-16 NOTE — Progress Notes (Signed)
Subjective:    Patient ID: Richard Santos, male    DOB: Sep 27, 1961, 58 y.o.   MRN: [id number]  HPI  Here to f/u; overall doing ok,  Pt denies chest pain, increasing sob or doe, wheezing, orthopnea, PND, increased LE swelling, palpitations, dizziness or syncope.  Pt denies new neurological symptoms such as new headache, or facial or extremity weakness or numbness.  Pt denies polydipsia, polyuria, or low sugar episode.  Pt states overall good compliance with meds, mostly trying to follow appropriate diet, with wt overall stable,  but little exercise however. No new complaints Past Medical History:  Diagnosis Date  . Allergic rhinitis   . Allergy   . Anxiety   . Chronic low back pain   . DDD (degenerative disc disease), lumbar   . ED (erectile dysfunction)   . Finger injury    cut pads off 3rd and 4th finger left hand/due to lawn mower accident  . GERD (gastroesophageal reflux disease)   . Heart murmur    as child only-   . HTN (hypertension) 08/20/2016  . Hyperlipidemia   . Incomplete right bundle branch block   . Kidney stones   . Prostate cancer ([place]) dx 10/02/14   stage T1c  . Prostate cancer ([place])   . Sigmoid diverticulosis   . Wears glasses    Past Surgical History:  Procedure Laterality Date  . COLONOSCOPY    . COLONOSCOPY W/ POLYPECTOMY  04-19-2012  . ESOPHAGOGASTRODUODENOSCOPY  08-05-2010  . POLYPECTOMY    . PROSTATE BIOPSY  10/02/14  . RADIOACTIVE SEED IMPLANT N/A 01/30/2015   Procedure: RADIOACTIVE SEED IMPLANT;  Surgeon: Rana Snare, MD;  Location: [facility];  Service: Urology;  Laterality: N/A;  . UPPER GASTROINTESTINAL ENDOSCOPY      reports that he has been smoking cigarettes. He has a 25.00 pack-year smoking history. He has never used smokeless tobacco. He reports current alcohol use of about 49.0 standard drinks of alcohol per week. He reports that he does not use drugs. family history includes Breast cancer in his sister; Heart disease in his  father, mother, and unknown relative; Prostate cancer in his father. Allergies  Allergen Reactions  . Bee Venom    Current Outpatient Medications on File Prior to Visit  Medication Sig Dispense Refill  . aspirin 81 MG tablet Take 81 mg by mouth daily.    Marland Kitchen EPINEPHrine (EPIPEN) 0.3 mg/0.3 mL IJ SOAJ injection Inject 0.3 mLs (0.3 mg total) into the muscle once. 2 Device 2  . ibuprofen (ADVIL,MOTRIN) 200 MG tablet Take 200 mg by mouth every 6 (six) hours as needed for pain. Takes two daily     No current facility-administered medications on file prior to visit.    Review of Systems  Constitutional: Negative for other unusual diaphoresis or sweats HENT: Negative for ear discharge or swelling Eyes: Negative for other worsening visual disturbances Respiratory: Negative for stridor or other swelling  Gastrointestinal: Negative for worsening distension or other blood Genitourinary: Negative for retention or other urinary change Musculoskeletal: Negative for other MSK pain or swelling Skin: Negative for color change or other new lesions Neurological: Negative for worsening tremors and other numbness  Psychiatric/Behavioral: Negative for worsening agitation or other fatigue All otherwise neg per pt    Objective:   Physical Exam BP 132/84   Pulse 100   Temp 98.3 F (36.8 C) (Oral)   Ht 6' (1.829 m)   Wt 167 lb (75.8 kg)   SpO2 98%  BMI 22.65 kg/m  VS noted,  Constitutional: Pt appears in NAD HENT: Head: NCAT.  Right Ear: External ear normal.  Left Ear: External ear normal.  Eyes: . Pupils are equal, round, and reactive to light. Conjunctivae and EOM are normal Nose: without d/c or deformity Neck: Neck supple. Gross normal ROM Cardiovascular: Normal rate and regular rhythm.   Pulmonary/Chest: Effort normal and breath sounds without rales or wheezing.  Abd:  Soft, NT, ND, + BS, no organomegaly Neurological: Pt is alert. At baseline orientation, motor grossly intact Skin: Skin is  warm. No rashes, other new lesions, no LE edema Psychiatric: Pt behavior is normal without agitation  All otherwise neg per pt Lab Results  Component Value Date   WBC 4.9 08/10/2018   HGB 14.6 08/10/2018   HCT 41.0 08/10/2018   PLT 204.0 08/10/2018   GLUCOSE 84 08/10/2018   CHOL 179 08/10/2018   TRIG 146.0 08/10/2018   HDL 46.60 08/10/2018   LDLDIRECT 102.0 08/13/2016   LDLCALC 104 (H) 08/10/2018   ALT 53 08/10/2018   AST 55 (H) 08/10/2018   NA 140 08/10/2018   K 4.7 08/10/2018   CL 104 08/10/2018   CREATININE 0.61 08/10/2018   BUN 9 08/10/2018   CO2 30 08/10/2018   TSH 1.56 08/10/2018   PSA 0.27 08/10/2018   INR 0.96 01/23/2015   HGBA1C 5.6 08/10/2018      Assessment & Plan:

## 2019-12-07 ENCOUNTER — Telehealth: Payer: Self-pay | Admitting: *Deleted

## 2019-12-07 NOTE — Telephone Encounter (Signed)
Tramadol PA initiated via CoverMyMeds.  KeyLaneta Simmers  PA Case ID: [id number]  Rx #: [id number]

## 2019-12-08 NOTE — Telephone Encounter (Addendum)
Received fax stating PA is approved 12/07/19 until 03/06/2020.

## 2019-12-12 ENCOUNTER — Other Ambulatory Visit (INDEPENDENT_AMBULATORY_CARE_PROVIDER_SITE_OTHER): Payer: 59

## 2019-12-12 DIAGNOSIS — E559 Vitamin D deficiency, unspecified: Secondary | ICD-10-CM

## 2019-12-12 DIAGNOSIS — E538 Deficiency of other specified B group vitamins: Secondary | ICD-10-CM | POA: Diagnosis not present

## 2019-12-12 DIAGNOSIS — E785 Hyperlipidemia, unspecified: Secondary | ICD-10-CM | POA: Diagnosis not present

## 2019-12-12 DIAGNOSIS — Z114 Encounter for screening for human immunodeficiency virus [HIV]: Secondary | ICD-10-CM

## 2019-12-12 DIAGNOSIS — R7302 Impaired glucose tolerance (oral): Secondary | ICD-10-CM | POA: Diagnosis not present

## 2019-12-12 DIAGNOSIS — E611 Iron deficiency: Secondary | ICD-10-CM | POA: Diagnosis not present

## 2019-12-12 LAB — CBC WITH DIFFERENTIAL/PLATELET
Basophils Absolute: 0.1 10*3/uL (ref 0.0–0.1)
Basophils Relative: 0.9 % (ref 0.0–3.0)
Eosinophils Absolute: 0.5 10*3/uL (ref 0.0–0.7)
Eosinophils Relative: 5.7 % — ABNORMAL HIGH (ref 0.0–5.0)
HCT: 43.1 % (ref 39.0–52.0)
Hemoglobin: 14.8 g/dL (ref 13.0–17.0)
Lymphocytes Relative: 25.3 % (ref 12.0–46.0)
Lymphs Abs: 2 10*3/uL (ref 0.7–4.0)
MCHC: 34.3 g/dL (ref 30.0–36.0)
MCV: 101.5 fl — ABNORMAL HIGH (ref 78.0–100.0)
Monocytes Absolute: 0.5 10*3/uL (ref 0.1–1.0)
Monocytes Relative: 6.4 % (ref 3.0–12.0)
Neutro Abs: 4.9 10*3/uL (ref 1.4–7.7)
Neutrophils Relative %: 61.7 % (ref 43.0–77.0)
Platelets: 218 10*3/uL (ref 150.0–400.0)
RBC: 4.24 Mil/uL (ref 4.22–5.81)
RDW: 13 % (ref 11.5–15.5)
WBC: 8 10*3/uL (ref 4.0–10.5)

## 2019-12-12 LAB — URINALYSIS, ROUTINE W REFLEX MICROSCOPIC
Bilirubin Urine: NEGATIVE
Hgb urine dipstick: NEGATIVE
Ketones, ur: NEGATIVE
Leukocytes,Ua: NEGATIVE
Nitrite: NEGATIVE
RBC / HPF: NONE SEEN (ref 0–?)
Specific Gravity, Urine: >=1.03 — AB (ref 1.000–1.030)
Total Protein, Urine: NEGATIVE
Urine Glucose: NEGATIVE
Urobilinogen, UA: 0.2 (ref 0.0–1.0)
WBC, UA: NONE SEEN (ref 0–?)
pH: 5.5 (ref 5.0–8.0)

## 2019-12-12 LAB — LIPID PANEL
Cholesterol: 198 mg/dL (ref 0–200)
HDL: 46.6 mg/dL (ref >39.00)
NonHDL: 151.82
Total CHOL/HDL Ratio: 4
Triglycerides: 301 mg/dL — ABNORMAL HIGH (ref 0.0–149.0)
VLDL: 60.2 mg/dL — ABNORMAL HIGH (ref 0.0–40.0)

## 2019-12-12 LAB — BASIC METABOLIC PANEL
BUN: 12 mg/dL (ref 6–23)
CO2: 29 mEq/L (ref 19–32)
Calcium: 9.7 mg/dL (ref 8.4–10.5)
Chloride: 102 mEq/L (ref 96–112)
Creatinine, Ser: 0.74 mg/dL (ref 0.40–1.50)
GFR: 108.52 mL/min (ref >60.00)
Glucose, Bld: 117 mg/dL — ABNORMAL HIGH (ref 70–99)
Potassium: 4.3 mEq/L (ref 3.5–5.1)
Sodium: 138 mEq/L (ref 135–145)

## 2019-12-12 LAB — LDL CHOLESTEROL, DIRECT: Direct LDL: 120 mg/dL

## 2019-12-12 LAB — HEPATIC FUNCTION PANEL
ALT: 52 U/L (ref 0–53)
AST: 41 U/L — ABNORMAL HIGH (ref 0–37)
Albumin: 4.6 g/dL (ref 3.5–5.2)
Alkaline Phosphatase: 62 U/L (ref 39–117)
Bilirubin, Direct: 0.1 mg/dL (ref 0.0–0.3)
Total Bilirubin: 0.5 mg/dL (ref 0.2–1.2)
Total Protein: 8 g/dL (ref 6.0–8.3)

## 2019-12-12 LAB — IBC PANEL
Iron: 200 ug/dL — ABNORMAL HIGH (ref 42–165)
Saturation Ratios: 51.8 % — ABNORMAL HIGH (ref 20.0–50.0)
Transferrin: 276 mg/dL (ref 212.0–360.0)

## 2019-12-12 LAB — VITAMIN D 25 HYDROXY (VIT D DEFICIENCY, FRACTURES): VITD: 28.85 ng/mL — ABNORMAL LOW (ref 30.00–100.00)

## 2019-12-12 LAB — TSH: TSH: 1.6 u[IU]/mL (ref 0.35–4.50)

## 2019-12-12 LAB — VITAMIN B12: Vitamin B-12: 216 pg/mL (ref 211–911)

## 2019-12-12 LAB — HEMOGLOBIN A1C: Hgb A1c MFr Bld: 5.4 % (ref 4.6–6.5)

## 2019-12-13 LAB — HIV ANTIBODY (ROUTINE TESTING W REFLEX): HIV 1&2 Ab, 4th Generation: NONREACTIVE

## 2019-12-15 ENCOUNTER — Encounter: Payer: Self-pay | Admitting: Internal Medicine

## 2019-12-15 ENCOUNTER — Ambulatory Visit (INDEPENDENT_AMBULATORY_CARE_PROVIDER_SITE_OTHER): Payer: 59 | Admitting: Internal Medicine

## 2019-12-15 ENCOUNTER — Other Ambulatory Visit: Payer: Self-pay

## 2019-12-15 VITALS — BP 140/86 | HR 96 | Temp 98.2°F | Ht 72.0 in | Wt 167.5 lb

## 2019-12-15 DIAGNOSIS — R739 Hyperglycemia, unspecified: Secondary | ICD-10-CM

## 2019-12-15 DIAGNOSIS — Z0000 Encounter for general adult medical examination without abnormal findings: Secondary | ICD-10-CM

## 2019-12-15 DIAGNOSIS — E538 Deficiency of other specified B group vitamins: Secondary | ICD-10-CM | POA: Diagnosis not present

## 2019-12-15 DIAGNOSIS — E785 Hyperlipidemia, unspecified: Secondary | ICD-10-CM

## 2019-12-15 DIAGNOSIS — Z0001 Encounter for general adult medical examination with abnormal findings: Secondary | ICD-10-CM | POA: Diagnosis not present

## 2019-12-15 DIAGNOSIS — R7302 Impaired glucose tolerance (oral): Secondary | ICD-10-CM

## 2019-12-15 DIAGNOSIS — E559 Vitamin D deficiency, unspecified: Secondary | ICD-10-CM

## 2019-12-15 MED ORDER — ATORVASTATIN CALCIUM 40 MG PO TABS: 40.0000 mg | ORAL_TABLET | Freq: Every day | ORAL | 3 refills | Status: DC

## 2019-12-15 NOTE — Progress Notes (Signed)
Subjective:    Patient ID: Richard Santos, male    DOB: June 06, 1961, 59 y.o.   MRN: [id number]  HPI  Here for wellness and f/u;  Overall doing ok;  Pt denies Chest pain, worsening SOB, DOE, wheezing, orthopnea, PND, worsening LE edema, palpitations, dizziness or syncope.  Pt denies neurological change such as new headache, facial or extremity weakness.  Pt denies polydipsia, polyuria, or low sugar symptoms. Pt states overall good compliance with treatment and medications, good tolerability, and has been trying to follow appropriate diet.  Pt denies worsening depressive symptoms, suicidal ideation or panic. No fever, night sweats, wt loss, loss of appetite, or other constitutional symptoms.  Pt states good ability with ADL's, has low fall risk, home safety reviewed and adequate, no other significant changes in hearing or vision, and only occasionally active with exercise. Has finished high dose Vit d replacement.  No new complaints Past Medical History:  Diagnosis Date  . Allergic rhinitis   . Allergy   . Anxiety   . Chronic low back pain   . DDD (degenerative disc disease), lumbar   . ED (erectile dysfunction)   . Finger injury    cut pads off 3rd and 4th finger left hand/due to lawn mower accident  . GERD (gastroesophageal reflux disease)   . Heart murmur    as child only-   . HTN (hypertension) 08/20/2016  . Hyperlipidemia   . Incomplete right bundle branch block   . Kidney stones   . Prostate cancer ([place]) dx 10/02/14   stage T1c  . Prostate cancer ([place])   . Sigmoid diverticulosis   . Wears glasses    Past Surgical History:  Procedure Laterality Date  . COLONOSCOPY    . COLONOSCOPY W/ POLYPECTOMY  04-19-2012  . ESOPHAGOGASTRODUODENOSCOPY  08-05-2010  . POLYPECTOMY    . PROSTATE BIOPSY  10/02/14  . RADIOACTIVE SEED IMPLANT N/A 01/30/2015   Procedure: RADIOACTIVE SEED IMPLANT;  Surgeon: Rana Snare, MD;  Location: [facility];  Service: Urology;  Laterality: N/A;  .  UPPER GASTROINTESTINAL ENDOSCOPY      reports that he has been smoking cigarettes. He has a 25.00 pack-year smoking history. He has never used smokeless tobacco. He reports current alcohol use of about 49.0 standard drinks of alcohol per week. He reports that he does not use drugs. family history includes Breast cancer in his sister; Heart disease in his father, mother, and unknown relative; Prostate cancer in his father. Allergies  Allergen Reactions  . Bee Venom    Current Outpatient Medications on File Prior to Visit  Medication Sig Dispense Refill  . aspirin 81 MG tablet Take 81 mg by mouth daily.    . busPIRone (BUSPAR) 10 MG tablet Take 1 tablet (10 mg total) by mouth 2 (two) times daily. 180 tablet 3  . EPINEPHrine (EPIPEN) 0.3 mg/0.3 mL IJ SOAJ injection Inject 0.3 mLs (0.3 mg total) into the muscle once. 2 Device 2  . losartan (COZAAR) 25 MG tablet Take 1 tablet (25 mg total) by mouth daily. 90 tablet 3  . pantoprazole (PROTONIX) 40 MG tablet Take 1 tablet (40 mg total) by mouth daily before breakfast. 90 tablet 3  . sildenafil (REVATIO) 20 MG tablet Take 3-5 tabs by mouth daily as needed 60 tablet 11  . traMADol (ULTRAM) 50 MG tablet TAKE HALF TABLET BY MOUTH 2 TIMES DAILY AS NEEDED FOR PAIN 60 tablet 1  . cetirizine (ZYRTEC) 10 MG tablet Take 1 tablet (10  mg total) by mouth daily. (Patient not taking: Reported on 12/15/2019) 90 tablet 3  . ibuprofen (ADVIL,MOTRIN) 200 MG tablet Take 200 mg by mouth every 6 (six) hours as needed for pain. Takes two daily    . tamsulosin (FLOMAX) 0.4 MG CAPS capsule Take 1 capsule (0.4 mg total) by mouth daily. (Patient not taking: Reported on 12/15/2019) 90 capsule 3   No current facility-administered medications on file prior to visit.   Review of Systems All otherwise neg per pt    Objective:   Physical Exam BP 140/86 (BP Location: Left Arm, Patient Position: Sitting, Cuff Size: Normal)   Pulse 96   Temp 98.2 F (36.8 C) (Oral)   Ht 6'  (1.829 m)   Wt 167 lb 8 oz (76 kg)   SpO2 97%   BMI 22.72 kg/m  VS noted,  Constitutional: Pt appears in NAD HENT: Head: NCAT.  Right Ear: External ear normal.  Left Ear: External ear normal.  Eyes: . Pupils are equal, round, and reactive to light. Conjunctivae and EOM are normal Nose: without d/c or deformity Neck: Neck supple. Gross normal ROM Cardiovascular: Normal rate and regular rhythm.   Pulmonary/Chest: Effort normal and breath sounds without rales or wheezing.  Abd:  Soft, NT, ND, + BS, no organomegaly Neurological: Pt is alert. At baseline orientation, motor grossly intact Skin: Skin is warm. No rashes, other new lesions, no LE edema Psychiatric: Pt behavior is normal without agitation  All otherwise neg per pt  Lab Results  Component Value Date   WBC 8.0 12/12/2019   HGB 14.8 12/12/2019   HCT 43.1 12/12/2019   PLT 218.0 12/12/2019   GLUCOSE 117 (H) 12/12/2019   CHOL 198 12/12/2019   TRIG 301.0 (H) 12/12/2019   HDL 46.60 12/12/2019   LDLDIRECT 120.0 12/12/2019   LDLCALC 104 (H) 08/10/2018   ALT 52 12/12/2019   AST 41 (H) 12/12/2019   NA 138 12/12/2019   K 4.3 12/12/2019   CL 102 12/12/2019   CREATININE 0.74 12/12/2019   BUN 12 12/12/2019   CO2 29 12/12/2019   TSH 1.60 12/12/2019   PSA 0.27 08/10/2018   INR 0.96 01/23/2015   HGBA1C 5.4 12/12/2019      Assessment & Plan:

## 2019-12-15 NOTE — Patient Instructions (Addendum)
For the COVID vaccine - please call [phone] (option 2) for an appt, but if unable to get through quickly, please go to the web page:   [email or web address] to be placed on the wait list (an email and phone number are required)  Also you can go online to [email or web address]  Please take all new medication as prescribed - the lipitor 40 mg per day (and stop the lovastatin)  Please take OTC Vitamin D3 at 2000 units per day, indefinitely.  OK to take oral OTC Vit B12  1 per day for 6 months  Please continue all other medications as before, and refills have been done if requested.  Please have the pharmacy call with any other refills you may need.  Please continue your efforts at being more active, low cholesterol diet, and weight control.  You are otherwise up to date with prevention measures today.  Please keep your appointments with your specialists as you may have planned  Please make an Appointment to return for your 1 year visit, or sooner if needed, with Lab testing by Appointment as well, to be done about 3-5 days before at the [facility] (so this is for TWO appointments - please see the scheduling desk as you leave)

## 2019-12-16 ENCOUNTER — Encounter: Payer: Self-pay | Admitting: Internal Medicine

## 2019-12-16 NOTE — Assessment & Plan Note (Signed)
Mild uncontrolled, for change lovastatin to lipitor 40 qd

## 2019-12-16 NOTE — Assessment & Plan Note (Signed)
stable overall by history and exam, recent data reviewed with pt, and pt to continue medical treatment as before,  to f/u any worsening symptoms or concerns  

## 2019-12-16 NOTE — Assessment & Plan Note (Signed)

## 2019-12-16 NOTE — Assessment & Plan Note (Signed)
For oral 2000 u qd

## 2020-01-24 ENCOUNTER — Other Ambulatory Visit: Payer: Self-pay

## 2020-01-24 MED ORDER — TRAMADOL HCL 50 MG PO TABS: ORAL_TABLET | ORAL | 1 refills | Status: DC

## 2020-01-24 NOTE — Telephone Encounter (Signed)
Done erx 

## 2020-01-26 ENCOUNTER — Ambulatory Visit: Payer: 59 | Attending: Internal Medicine

## 2020-01-26 DIAGNOSIS — Z23 Encounter for immunization: Secondary | ICD-10-CM

## 2020-01-26 NOTE — Progress Notes (Signed)
   Covid-19 Vaccination Clinic  Name:  ABDULMALIK DARCO    MRN: [id number] DOB: Dec 08, 1960  01/26/2020  Richard Santos was observed post Covid-19 immunization for 15 minutes without incident. He was provided with Vaccine Information Sheet and instruction to access the V-Safe system.   Richard Santos was instructed to call 911 with any severe reactions post vaccine: Marland Kitchen Difficulty breathing  . Swelling of face and throat  . A fast heartbeat  . A bad rash all over body  . Dizziness and weakness

## 2020-02-27 ENCOUNTER — Ambulatory Visit: Payer: 59 | Attending: Internal Medicine

## 2020-02-27 DIAGNOSIS — Z23 Encounter for immunization: Secondary | ICD-10-CM

## 2020-02-27 NOTE — Progress Notes (Signed)
   Covid-19 Vaccination Clinic  Name:  Richard Santos    MRN: [id number] DOB: 03-Apr-1961  02/27/2020  [name] was observed post Covid-19 immunization for 15 minutes without incident. He was provided with Vaccine Information Sheet and instruction to access the V-Safe system.   Mr. Gunderson was instructed to call 911 with any severe reactions post vaccine: Marland Kitchen Difficulty breathing  . Swelling of face and throat  . A fast heartbeat  . A bad rash all over body  . Dizziness and weakness   Immunizations Administered    Name Date Dose VIS Date Route   Moderna COVID-19 Vaccine 02/27/2020 11:14 AM 0.5 mL 10/24/2019 Intramuscular   Manufacturer: Moderna   Lot: [id number]   [place]: [id number]

## 2020-03-23 DIAGNOSIS — C3490 Malignant neoplasm of unspecified part of unspecified bronchus or lung: Secondary | ICD-10-CM

## 2020-03-23 HISTORY — DX: Malignant neoplasm of unspecified part of unspecified bronchus or lung: C34.90

## 2020-04-03 ENCOUNTER — Ambulatory Visit (INDEPENDENT_AMBULATORY_CARE_PROVIDER_SITE_OTHER): Payer: 59

## 2020-04-03 ENCOUNTER — Other Ambulatory Visit: Payer: Self-pay

## 2020-04-03 ENCOUNTER — Ambulatory Visit (INDEPENDENT_AMBULATORY_CARE_PROVIDER_SITE_OTHER): Payer: 59 | Admitting: Internal Medicine

## 2020-04-03 ENCOUNTER — Encounter: Payer: Self-pay | Admitting: Internal Medicine

## 2020-04-03 VITALS — BP 170/82 | HR 81 | Temp 98.4°F | Ht 72.0 in | Wt 166.0 lb

## 2020-04-03 DIAGNOSIS — E785 Hyperlipidemia, unspecified: Secondary | ICD-10-CM | POA: Diagnosis not present

## 2020-04-03 DIAGNOSIS — R079 Chest pain, unspecified: Secondary | ICD-10-CM

## 2020-04-03 DIAGNOSIS — I10 Essential (primary) hypertension: Secondary | ICD-10-CM | POA: Diagnosis not present

## 2020-04-03 DIAGNOSIS — F17200 Nicotine dependence, unspecified, uncomplicated: Secondary | ICD-10-CM

## 2020-04-03 DIAGNOSIS — R7302 Impaired glucose tolerance (oral): Secondary | ICD-10-CM | POA: Diagnosis not present

## 2020-04-03 MED ORDER — MELOXICAM 15 MG PO TABS: 15.0000 mg | ORAL_TABLET | Freq: Every day | ORAL | 0 refills | Status: DC | PRN

## 2020-04-03 MED ORDER — LOSARTAN POTASSIUM 50 MG PO TABS: 50.0000 mg | ORAL_TABLET | Freq: Every day | ORAL | 3 refills | Status: DC

## 2020-04-03 NOTE — Progress Notes (Signed)
Subjective:    Patient ID: Richard Santos, male    DOB: October 08, 1961, 59 y.o.   MRN: [id number]  HPI  Here to f/u; overall doing ok,  Pt c/o left anterolat sharp tender CP worse sometimes to move the left arm and shoulder, some sore to touch. But not assoc with sob, diaphoresis, nv, palp or syncope  Pt denies new neurological symptoms such as new headache, or facial or extremity weakness or numbness.  Pt denies polydipsia, polyuria, or low sugar episode.  Pt states overall good compliance with meds, mostly trying to follow appropriate diet, with wt overall stable,  but little exercise however.  Still smoking 1 ppd for 38 yrs.   Pt denies fever, wt loss, night sweats, loss of appetite, or other constitutional symptoms  No cough BP Readings from Last 3 Encounters:  04/03/20 (!) 170/82  12/15/19 140/86  08/16/19 132/84   Past Medical History:  Diagnosis Date  . Allergic rhinitis   . Allergy   . Anxiety   . Chronic low back pain   . DDD (degenerative disc disease), lumbar   . ED (erectile dysfunction)   . Finger injury    cut pads off 3rd and 4th finger left hand/due to lawn mower accident  . GERD (gastroesophageal reflux disease)   . Heart murmur    as child only-   . HTN (hypertension) 08/20/2016  . Hyperlipidemia   . Incomplete right bundle branch block   . Kidney stones   . Prostate cancer ([place]) dx 10/02/14   stage T1c  . Prostate cancer ([place])   . Sigmoid diverticulosis   . Wears glasses    Past Surgical History:  Procedure Laterality Date  . COLONOSCOPY    . COLONOSCOPY W/ POLYPECTOMY  04-19-2012  . ESOPHAGOGASTRODUODENOSCOPY  08-05-2010  . POLYPECTOMY    . PROSTATE BIOPSY  10/02/14  . RADIOACTIVE SEED IMPLANT N/A 01/30/2015   Procedure: RADIOACTIVE SEED IMPLANT;  Surgeon: Rana Snare, MD;  [facility];  Service: Urology;  Laterality: N/A;  . UPPER GASTROINTESTINAL ENDOSCOPY      reports that he has been smoking cigarettes. He has a 25.00 pack-year  smoking history. He has never used smokeless tobacco. He reports current alcohol use of about 49.0 standard drinks of alcohol per week. He reports that he does not use drugs. family history includes Breast cancer in his sister; Heart disease in his father, mother, and unknown relative; Prostate cancer in his father. Allergies  Allergen Reactions  . Bee Venom    Current Outpatient Medications on File Prior to Visit  Medication Sig Dispense Refill  . aspirin 81 MG tablet Take 81 mg by mouth daily.    Marland Kitchen atorvastatin (LIPITOR) 40 MG tablet Take 1 tablet (40 mg total) by mouth daily. 90 tablet 3  . busPIRone (BUSPAR) 10 MG tablet Take 1 tablet (10 mg total) by mouth 2 (two) times daily. 180 tablet 3  . cetirizine (ZYRTEC) 10 MG tablet Take 1 tablet (10 mg total) by mouth daily. 90 tablet 3  . EPINEPHrine (EPIPEN) 0.3 mg/0.3 mL IJ SOAJ injection Inject 0.3 mLs (0.3 mg total) into the muscle once. 2 Device 2  . ibuprofen (ADVIL,MOTRIN) 200 MG tablet Take 200 mg by mouth every 6 (six) hours as needed for pain. Takes two daily    . pantoprazole (PROTONIX) 40 MG tablet Take 1 tablet (40 mg total) by mouth daily before breakfast. 90 tablet 3  . sildenafil (REVATIO) 20 MG tablet Take  3-5 tabs by mouth daily as needed 60 tablet 11  . tamsulosin (FLOMAX) 0.4 MG CAPS capsule Take 1 capsule (0.4 mg total) by mouth daily. 90 capsule 3  . traMADol (ULTRAM) 50 MG tablet TAKE HALF TABLET BY MOUTH 2 TIMES DAILY AS NEEDED FOR PAIN 60 tablet 1   No current facility-administered medications on file prior to visit.   Review of Systems All otherwise neg per pt     Objective:   Physical Exam BP (!) 170/82 (BP Location: Left Arm, Patient Position: Sitting, Cuff Size: Large)   Pulse 81   Temp 98.4 F (36.9 C) (Oral)   Ht 6' (1.829 m)   Wt 166 lb (75.3 kg)   SpO2 97%   BMI 22.51 kg/m  VS noted,  Constitutional: Pt appears in NAD HENT: Head: NCAT.  Right Ear: External ear normal.  Left Ear: External ear  normal.  Eyes: . Pupils are equal, round, and reactive to light. Conjunctivae and EOM are normal Nose: without d/c or deformity Neck: Neck supple. Gross normal ROM Cardiovascular: Normal rate and regular rhythm.   Pulmonary/Chest: Effort normal and breath sounds without rales or wheezing.  Abd:  Soft, NT, ND, + BS, no organomegaly Neurological: Pt is alert. At baseline orientation, motor grossly intact Skin: Skin is warm. No rashes, other new lesions, no LE edema Psychiatric: Pt behavior is normal without agitation  All otherwise neg per pt  ECG today I have personally interpreted:  NSR IRBBB, no ischemic changes Lab Results  Component Value Date   WBC 8.0 12/12/2019   HGB 14.8 12/12/2019   HCT 43.1 12/12/2019   PLT 218.0 12/12/2019   GLUCOSE 117 (H) 12/12/2019   CHOL 198 12/12/2019   TRIG 301.0 (H) 12/12/2019   HDL 46.60 12/12/2019   LDLDIRECT 120.0 12/12/2019   LDLCALC 104 (H) 08/10/2018   ALT 52 12/12/2019   AST 41 (H) 12/12/2019   NA 138 12/12/2019   K 4.3 12/12/2019   CL 102 12/12/2019   CREATININE 0.74 12/12/2019   BUN 12 12/12/2019   CO2 29 12/12/2019   TSH 1.60 12/12/2019   PSA 0.27 08/10/2018   INR 0.96 01/23/2015   HGBA1C 5.4 12/12/2019       Assessment & Plan:

## 2020-04-03 NOTE — Assessment & Plan Note (Signed)
Cont statin, for lower chol diet as well

## 2020-04-03 NOTE — Assessment & Plan Note (Signed)
stable overall by history and exam, recent data reviewed with pt, and pt to continue medical treatment as before,  to f/u any worsening symptoms or concerns  

## 2020-04-03 NOTE — Patient Instructions (Signed)
Please take all new medication as prescribed - the anti-inflammatory for pain  Ok to increase the losartan to 50 mg per day  Your EKG was OK today  Please continue all other medications as before, and refills have been done if requested.  Please have the pharmacy call with any other refills you may need.  Please continue your efforts at being more active, low cholesterol diet, and weight control.  You are otherwise up to date with prevention measures today.  Please keep your appointments with your specialists as you may have planned  You will be contacted regarding the referral for: Stress testing  Please go to the XRAY Department in the first floor for the x-ray testing  You will be contacted by phone if any changes need to be made immediately.  Otherwise, you will receive a letter about your results with an explanation, but please check with MyChart first.  Please remember to sign up for MyChart if you have not done so, as this will be important to you in the future with finding out test results, communicating by private email, and scheduling acute appointments online when needed.  Please make an Appointment to return in 6 months, or sooner if needed

## 2020-04-03 NOTE — Assessment & Plan Note (Addendum)
Atypical - ? Msk, but cant r/o cardiac, ecg reviewed; for cxr, and stress test, cont all other tx  I spent 41 minutes in preparing to see the patient by review of recent labs, imaging and procedures, obtaining and reviewing separately obtained history, communicating with the patient and family or caregiver, ordering medications, tests or procedures, and documenting clinical information in the EHR including the differential Dx, treatment, and any further evaluation and other management of cp, htn, hld, hyperglycemia, smoker

## 2020-04-03 NOTE — Assessment & Plan Note (Signed)
Counseled on tobacco cessation, declines for now

## 2020-04-03 NOTE — Assessment & Plan Note (Signed)
[place] for increased losartan 50 qd

## 2020-04-04 ENCOUNTER — Encounter: Payer: Self-pay | Admitting: Internal Medicine

## 2020-04-11 ENCOUNTER — Emergency Department (HOSPITAL_COMMUNITY): Payer: 59

## 2020-04-11 ENCOUNTER — Encounter (HOSPITAL_COMMUNITY)
Admission: EM | Disposition: A | Discharge status: Home / Self Care | Payer: Self-pay | Source: Home / Self Care | Attending: Internal Medicine

## 2020-04-11 ENCOUNTER — Ambulatory Visit (HOSPITAL_COMMUNITY): Admit: 2020-04-11 | Payer: 59 | Admitting: Cardiology

## 2020-04-11 ENCOUNTER — Encounter (HOSPITAL_COMMUNITY): Payer: Self-pay | Admitting: Pharmacy Technician

## 2020-04-11 ENCOUNTER — Inpatient Hospital Stay (HOSPITAL_COMMUNITY)
Admission: EM | Admit: 2020-04-11 | Discharge: 2020-04-12 | DRG: 167 | Disposition: A | Discharge status: Home / Self Care | Payer: 59 | Attending: Internal Medicine | Admitting: Internal Medicine

## 2020-04-11 ENCOUNTER — Other Ambulatory Visit: Payer: Self-pay

## 2020-04-11 ENCOUNTER — Inpatient Hospital Stay (HOSPITAL_COMMUNITY): Payer: 59

## 2020-04-11 DIAGNOSIS — M5136 Other intervertebral disc degeneration, lumbar region: Secondary | ICD-10-CM | POA: Diagnosis not present

## 2020-04-11 DIAGNOSIS — R918 Other nonspecific abnormal finding of lung field: Secondary | ICD-10-CM | POA: Diagnosis not present

## 2020-04-11 DIAGNOSIS — F17210 Nicotine dependence, cigarettes, uncomplicated: Secondary | ICD-10-CM | POA: Diagnosis present

## 2020-04-11 DIAGNOSIS — R55 Syncope and collapse: Secondary | ICD-10-CM | POA: Diagnosis present

## 2020-04-11 DIAGNOSIS — I10 Essential (primary) hypertension: Secondary | ICD-10-CM | POA: Diagnosis present

## 2020-04-11 DIAGNOSIS — M1990 Unspecified osteoarthritis, unspecified site: Secondary | ICD-10-CM | POA: Diagnosis present

## 2020-04-11 DIAGNOSIS — F40240 Claustrophobia: Secondary | ICD-10-CM | POA: Diagnosis present

## 2020-04-11 DIAGNOSIS — C3492 Malignant neoplasm of unspecified part of left bronchus or lung: Secondary | ICD-10-CM

## 2020-04-11 DIAGNOSIS — C7931 Secondary malignant neoplasm of brain: Secondary | ICD-10-CM | POA: Diagnosis present

## 2020-04-11 DIAGNOSIS — Z8042 Family history of malignant neoplasm of prostate: Secondary | ICD-10-CM | POA: Diagnosis not present

## 2020-04-11 DIAGNOSIS — C3412 Malignant neoplasm of upper lobe, left bronchus or lung: Principal | ICD-10-CM | POA: Diagnosis present

## 2020-04-11 DIAGNOSIS — K5730 Diverticulosis of large intestine without perforation or abscess without bleeding: Secondary | ICD-10-CM | POA: Diagnosis not present

## 2020-04-11 DIAGNOSIS — K219 Gastro-esophageal reflux disease without esophagitis: Secondary | ICD-10-CM | POA: Diagnosis not present

## 2020-04-11 DIAGNOSIS — Z791 Long term (current) use of non-steroidal anti-inflammatories (NSAID): Secondary | ICD-10-CM | POA: Diagnosis not present

## 2020-04-11 DIAGNOSIS — Z79899 Other long term (current) drug therapy: Secondary | ICD-10-CM

## 2020-04-11 DIAGNOSIS — Z7982 Long term (current) use of aspirin: Secondary | ICD-10-CM | POA: Diagnosis not present

## 2020-04-11 DIAGNOSIS — E785 Hyperlipidemia, unspecified: Secondary | ICD-10-CM | POA: Diagnosis not present

## 2020-04-11 DIAGNOSIS — F1010 Alcohol abuse, uncomplicated: Secondary | ICD-10-CM | POA: Diagnosis present

## 2020-04-11 DIAGNOSIS — Z20822 Contact with and (suspected) exposure to covid-19: Secondary | ICD-10-CM | POA: Diagnosis present

## 2020-04-11 DIAGNOSIS — I4510 Unspecified right bundle-branch block: Secondary | ICD-10-CM | POA: Diagnosis not present

## 2020-04-11 DIAGNOSIS — Z720 Tobacco use: Secondary | ICD-10-CM | POA: Diagnosis not present

## 2020-04-11 DIAGNOSIS — F411 Generalized anxiety disorder: Secondary | ICD-10-CM | POA: Diagnosis present

## 2020-04-11 DIAGNOSIS — Z9889 Other specified postprocedural states: Secondary | ICD-10-CM

## 2020-04-11 DIAGNOSIS — Z91030 Bee allergy status: Secondary | ICD-10-CM | POA: Diagnosis not present

## 2020-04-11 DIAGNOSIS — Z8249 Family history of ischemic heart disease and other diseases of the circulatory system: Secondary | ICD-10-CM | POA: Diagnosis not present

## 2020-04-11 DIAGNOSIS — Z803 Family history of malignant neoplasm of breast: Secondary | ICD-10-CM | POA: Diagnosis not present

## 2020-04-11 DIAGNOSIS — Z8546 Personal history of malignant neoplasm of prostate: Secondary | ICD-10-CM | POA: Diagnosis not present

## 2020-04-11 HISTORY — DX: Personal history of urinary calculi: Z87.442

## 2020-04-11 LAB — CBC WITH DIFFERENTIAL/PLATELET
Abs Immature Granulocytes: 0.02 10*3/uL (ref 0.00–0.07)
Basophils Absolute: 0.1 10*3/uL (ref 0.0–0.1)
Basophils Relative: 1 %
Eosinophils Absolute: 0.4 10*3/uL (ref 0.0–0.5)
Eosinophils Relative: 5 %
HCT: 38.5 % — ABNORMAL LOW (ref 39.0–52.0)
Hemoglobin: 13 g/dL (ref 13.0–17.0)
Immature Granulocytes: 0 %
Lymphocytes Relative: 16 %
Lymphs Abs: 1.2 10*3/uL (ref 0.7–4.0)
MCH: 34.6 pg — ABNORMAL HIGH (ref 26.0–34.0)
MCHC: 33.8 g/dL (ref 30.0–36.0)
MCV: 102.4 fL — ABNORMAL HIGH (ref 80.0–100.0)
Monocytes Absolute: 0.4 10*3/uL (ref 0.1–1.0)
Monocytes Relative: 5 %
Neutro Abs: 5.6 10*3/uL (ref 1.7–7.7)
Neutrophils Relative %: 73 %
Platelets: 213 10*3/uL (ref 150–400)
RBC: 3.76 MIL/uL — ABNORMAL LOW (ref 4.22–5.81)
RDW: 12.3 % (ref 11.5–15.5)
WBC: 7.6 10*3/uL (ref 4.0–10.5)
nRBC: 0 % (ref 0.0–0.2)

## 2020-04-11 LAB — COMPREHENSIVE METABOLIC PANEL
ALT: 40 U/L (ref 0–44)
AST: 41 U/L (ref 15–41)
Albumin: 3.8 g/dL (ref 3.5–5.0)
Alkaline Phosphatase: 64 U/L (ref 38–126)
Anion gap: 10 (ref 5–15)
BUN: 8 mg/dL (ref 6–20)
CO2: 24 mmol/L (ref 22–32)
Calcium: 9.2 mg/dL (ref 8.9–10.3)
Chloride: 107 mmol/L (ref 98–111)
Creatinine, Ser: 0.63 mg/dL (ref 0.61–1.24)
GFR calc Af Amer: >60 mL/min (ref >60)
GFR calc non Af Amer: >60 mL/min (ref >60)
Glucose, Bld: 111 mg/dL — ABNORMAL HIGH (ref 70–99)
Potassium: 4 mmol/L (ref 3.5–5.1)
Sodium: 141 mmol/L (ref 135–145)
Total Bilirubin: 0.6 mg/dL (ref 0.3–1.2)
Total Protein: 7 g/dL (ref 6.5–8.1)

## 2020-04-11 LAB — SARS CORONAVIRUS 2 BY RT PCR (HOSPITAL ORDER, PERFORMED IN ~~LOC~~ HOSPITAL LAB): SARS Coronavirus 2: NEGATIVE

## 2020-04-11 LAB — PROTIME-INR
INR: 1 (ref 0.8–1.2)
Prothrombin Time: 12.8 seconds (ref 11.4–15.2)

## 2020-04-11 LAB — LIPASE, BLOOD: Lipase: 33 U/L (ref 11–51)

## 2020-04-11 LAB — TROPONIN I (HIGH SENSITIVITY)
Troponin I (High Sensitivity): 4 ng/L (ref <18)
Troponin I (High Sensitivity): 4 ng/L (ref <18)

## 2020-04-11 LAB — D-DIMER, QUANTITATIVE: D-Dimer, Quant: 0.71 ug/mL-FEU — ABNORMAL HIGH (ref 0.00–0.50)

## 2020-04-11 SURGERY — CORONARY/GRAFT ACUTE MI REVASCULARIZATION
Anesthesia: LOCAL

## 2020-04-11 MED ORDER — TRAMADOL HCL 50 MG PO TABS: 50.0000 mg | ORAL_TABLET | Freq: Two times a day (BID) | ORAL | Status: DC | PRN

## 2020-04-11 MED ORDER — THIAMINE HCL 100 MG/ML IJ SOLN: 100.0000 mg | Freq: Every day | INTRAMUSCULAR | Status: DC

## 2020-04-11 MED ORDER — LORAZEPAM 1 MG PO TABS: 1.0000 mg | ORAL_TABLET | ORAL | Status: DC | PRN

## 2020-04-11 MED ORDER — LORAZEPAM 2 MG/ML IJ SOLN: 1.0000 mg | INTRAMUSCULAR | Status: DC | PRN

## 2020-04-11 MED ORDER — BUSPIRONE HCL 5 MG PO TABS
10.0000 mg | ORAL_TABLET | Freq: Two times a day (BID) | ORAL | Status: DC
  Administered 2020-04-11: 10 mg via ORAL
  Filled 2020-04-11: qty 2

## 2020-04-11 MED ORDER — ONDANSETRON HCL 4 MG/2ML IJ SOLN: 4.0000 mg | Freq: Four times a day (QID) | INTRAMUSCULAR | Status: DC | PRN

## 2020-04-11 MED ORDER — LORAZEPAM 2 MG/ML IJ SOLN: 0.0000 mg | Freq: Two times a day (BID) | INTRAMUSCULAR | Status: DC

## 2020-04-11 MED ORDER — ACETAMINOPHEN 325 MG PO TABS: 650.0000 mg | ORAL_TABLET | Freq: Four times a day (QID) | ORAL | Status: DC | PRN

## 2020-04-11 MED ORDER — SODIUM CHLORIDE 0.9% FLUSH
3.0000 mL | Freq: Two times a day (BID) | INTRAVENOUS | Status: DC
  Administered 2020-04-11: 3 mL via INTRAVENOUS

## 2020-04-11 MED ORDER — GADOBUTROL 1 MMOL/ML IV SOLN
7.5000 mL | Freq: Once | INTRAVENOUS | Status: AC | PRN
  Administered 2020-04-11: 7.5 mL via INTRAVENOUS

## 2020-04-11 MED ORDER — IOHEXOL 350 MG/ML SOLN
100.0000 mL | Freq: Once | INTRAVENOUS | Status: AC | PRN
  Administered 2020-04-11: 100 mL via INTRAVENOUS

## 2020-04-11 MED ORDER — ONDANSETRON HCL 4 MG PO TABS: 4.0000 mg | ORAL_TABLET | Freq: Four times a day (QID) | ORAL | Status: DC | PRN

## 2020-04-11 MED ORDER — ATORVASTATIN CALCIUM 40 MG PO TABS: 40.0000 mg | ORAL_TABLET | Freq: Every day | ORAL | Status: DC

## 2020-04-11 MED ORDER — NICOTINE 21 MG/24HR TD PT24: 21.0000 mg | MEDICATED_PATCH | Freq: Every day | TRANSDERMAL | Status: DC

## 2020-04-11 MED ORDER — FOLIC ACID 1 MG PO TABS: 1.0000 mg | ORAL_TABLET | Freq: Every day | ORAL | Status: DC

## 2020-04-11 MED ORDER — ALBUTEROL SULFATE (2.5 MG/3ML) 0.083% IN NEBU: 2.5000 mg | INHALATION_SOLUTION | Freq: Four times a day (QID) | RESPIRATORY_TRACT | Status: DC | PRN

## 2020-04-11 MED ORDER — THIAMINE HCL 100 MG PO TABS: 100.0000 mg | ORAL_TABLET | Freq: Every day | ORAL | Status: DC

## 2020-04-11 MED ORDER — TRAZODONE HCL 50 MG PO TABS
50.0000 mg | ORAL_TABLET | Freq: Every evening | ORAL | Status: AC | PRN
  Administered 2020-04-12: 50 mg via ORAL
  Filled 2020-04-11: qty 1

## 2020-04-11 MED ORDER — LORAZEPAM 2 MG/ML IJ SOLN: 0.0000 mg | Freq: Four times a day (QID) | INTRAMUSCULAR | Status: DC

## 2020-04-11 MED ORDER — ENOXAPARIN SODIUM 40 MG/0.4ML ~~LOC~~ SOLN
40.0000 mg | SUBCUTANEOUS | Status: DC
  Administered 2020-04-11: 40 mg via SUBCUTANEOUS
  Filled 2020-04-11: qty 0.4

## 2020-04-11 MED ORDER — ACETAMINOPHEN 650 MG RE SUPP: 650.0000 mg | Freq: Four times a day (QID) | RECTAL | Status: DC | PRN

## 2020-04-11 MED ORDER — ADULT MULTIVITAMIN W/MINERALS CH: 1.0000 | ORAL_TABLET | Freq: Every day | ORAL | Status: DC

## 2020-04-11 NOTE — H&P (Addendum)
History and Physical    Richard Santos MWU:[id number] DOB: 06/10/1961 DOA: 04/11/2020  Referring MD/NP/PA: Tomasa Rand, MD PCP: [name], MD  Patient coming from: via EMS  Chief Complaint: Syncope  I have personally briefly reviewed patient's old medical records in [place]   HPI: Richard Santos is a 59 y.o. male with medical history significant of hypertension, hyperlipidemia, anxiety, prostate cancer s/p radioactive seeds, and tobacco abuse presents after having a syncopal episode while he was at work.  He notes that he started having this burning sensation across the left side of his chest into his armpit about 2 weeks ago.  Due to persistence of symptoms he followed up with his primary care provider who obtained a chest x-ray and EKG and he was advised to go get a stress test.  Over the weekend he had traveled to [place] to visit his mother and denied having any issues.  However, he was working on a job today when he developed severe tightness in his chest and broke out into a sweat.  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  At some point he passed out for possibly a few seconds prior to waking back up.  Denies having any fever, nausea, vomiting, abdominal pain, dysuria, diarrhea, history of syncope, leg swelling, or calf pain.  Patient does admit to smoking 1 pack of cigarettes per day on average since the age of 72.  He normally has like a mild cough upon waking up in the morning to clear his throat, but denies having any hemoptysis. En route EMS vital signs were noted to be stable and patient was given 324 mg of aspirin.  ED Course: Upon admission into the emergency department patient was noted to have stable vital signs.  Labs significant for D-dimer 0.71 and negative high-sensitivity troponin x2.  CT angiogram of the chest showed a left hilar mass which invades the mediastinum, left hilum, and encases the left upper lobe  pulmonary artery concerning for primary bronchogenic carcinoma.  Case was discussed with cardiothoracic surgery who recommended PCCM to be consulted for likely need of bronchoscopy.  Review of Systems  Constitutional: Positive for diaphoresis. Negative for fever and weight loss.  HENT: Negative for ear discharge and nosebleeds.   Eyes: Negative for double vision and photophobia.  Respiratory: Positive for cough. Negative for hemoptysis, shortness of breath and wheezing.   Cardiovascular: Positive for chest pain. Negative for leg swelling.  Gastrointestinal: Negative for abdominal pain, nausea and vomiting.  Genitourinary: Negative for dysuria and hematuria.  Musculoskeletal: Negative for joint pain.  Skin: Negative for itching and rash.  Neurological: Positive for dizziness and loss of consciousness. Negative for focal weakness.  Endo/Heme/Allergies: Negative for polydipsia.  Psychiatric/Behavioral: Positive for substance abuse (Tobacco). Negative for memory loss.    Past Medical History:  Diagnosis Date  . Allergic rhinitis   . Allergy   . Anxiety   . Chronic low back pain   . DDD (degenerative disc disease), lumbar   . ED (erectile dysfunction)   . Finger injury    cut pads off 3rd and 4th finger left hand/due to lawn mower accident  . GERD (gastroesophageal reflux disease)   . Heart murmur    as child only-   . HTN (hypertension) 08/20/2016  . Hyperlipidemia   . Incomplete right bundle branch block   . Kidney stones   . Prostate cancer [facility]) dx 10/02/14  stage T1c  . Prostate cancer ([place])   . Sigmoid diverticulosis   . Wears glasses     Past Surgical History:  Procedure Laterality Date  . COLONOSCOPY    . COLONOSCOPY W/ POLYPECTOMY  04-19-2012  . ESOPHAGOGASTRODUODENOSCOPY  08-05-2010  . POLYPECTOMY    . PROSTATE BIOPSY  10/02/14  . RADIOACTIVE SEED IMPLANT N/A 01/30/2015   Procedure: RADIOACTIVE SEED IMPLANT;  Surgeon: Rana Snare, MD;  Location: [facility];  Service: Urology;  Laterality: N/A;  . UPPER GASTROINTESTINAL ENDOSCOPY       reports that he has been smoking cigarettes. He has a 25.00 pack-year smoking history. He has never used smokeless tobacco. He reports current alcohol use of about 49.0 standard drinks of alcohol per week. He reports that he does not use drugs.  Allergies  Allergen Reactions  . Bee Venom Swelling    Family History  Problem Relation Age of Onset  . Prostate cancer Father   . Heart disease Father   . Heart disease Mother   . Breast cancer Sister   . Heart disease Other   . Colon cancer Neg Hx   . Colon polyps Neg Hx   . Esophageal cancer Neg Hx   . Rectal cancer Neg Hx   . Stomach cancer Neg Hx     Prior to Admission medications   Medication Sig Start Date End Date Taking? Authorizing Provider  aspirin 81 MG tablet Take 81 mg by mouth daily.   Yes [provider]  atorvastatin (LIPITOR) 40 MG tablet Take 1 tablet (40 mg total) by mouth daily. 12/15/19  Yes [name], MD  busPIRone (BUSPAR) 10 MG tablet Take 1 tablet (10 mg total) by mouth 2 (two) times daily. 08/16/19  Yes [name], MD  EPINEPHrine (EPIPEN) 0.3 mg/0.3 mL IJ SOAJ injection Inject 0.3 mLs (0.3 mg total) into the muscle once. 07/31/14  Yes [name], MD  ibuprofen (ADVIL,MOTRIN) 200 MG tablet Take 200 mg by mouth every 6 (six) hours as needed for pain. Takes two daily   Yes [provider]  losartan (COZAAR) 50 MG tablet Take 1 tablet (50 mg total) by mouth daily. 04/03/20  Yes [name], MD  meloxicam (MOBIC) 15 MG tablet Take 1 tablet (15 mg total) by mouth daily as needed for pain. 04/03/20  Yes [name], MD  Multiple Vitamin (MULTIVITAMIN) capsule Take 1 capsule by mouth daily.   Yes [provider]  pantoprazole (PROTONIX) 40 MG tablet Take 1 tablet (40 mg total) by mouth daily before breakfast. 08/16/19  Yes [name], MD  sildenafil (REVATIO) 20 MG tablet Take 3-5 tabs by  mouth daily as needed 08/16/19  Yes [name], MD  traMADol (ULTRAM) 50 MG tablet TAKE HALF TABLET BY MOUTH 2 TIMES DAILY AS NEEDED FOR PAIN 01/24/20  Yes [name], MD  cetirizine (ZYRTEC) 10 MG tablet Take 1 tablet (10 mg total) by mouth daily. Patient not taking: Reported on 04/11/2020 08/16/19   [name], MD  tamsulosin (FLOMAX) 0.4 MG CAPS capsule Take 1 capsule (0.4 mg total) by mouth daily. Patient not taking: Reported on 04/11/2020 08/16/19   [name], MD    Physical Exam:  Constitutional: Middle-age male who appears to be in NAD, calm, comfortable Vitals:   04/11/20 1430 04/11/20 1445 04/11/20 1500 04/11/20 1515  BP:      Pulse: 79 83 91 83  Resp: 18 17 19  19  Temp:      TempSrc:      SpO2: 93% 92% 96% 99%  Weight:      Height:       Eyes: PERRL, lids and conjunctivae normal ENMT: Mucous membranes are moist. Posterior pharynx clear of any exudate or lesions.  Neck: normal, supple, no masses, no thyromegaly Respiratory: clear to auscultation bilaterally, no wheezing, no crackles. Normal respiratory effort. No accessory muscle use.  Cardiovascular: Regular rate and rhythm, no murmurs / rubs / gallops. No extremity edema. 2+ pedal pulses. No carotid bruits.  Abdomen: no tenderness, no masses palpated. No hepatosplenomegaly. Bowel sounds positive.  Musculoskeletal: no clubbing / cyanosis. No joint deformity upper and lower extremities. Good ROM, no contractures. Normal muscle tone.  Skin: no rashes, lesions, ulcers. No induration Neurologic: CN 2-12 grossly intact. Sensation intact, DTR normal. Strength 5/5 in all 4.  Psychiatric: Normal judgment and insight. Alert and oriented x 3. Normal mood.     Labs on Admission: I have personally reviewed following labs and imaging studies  CBC: Recent Labs  Lab 04/11/20 1016  WBC 7.6  NEUTROABS 5.6  HGB 13.0  HCT 38.5*  MCV 102.4*  PLT 086   Basic Metabolic Panel: Recent Labs  Lab 04/11/20 1016  NA 141  K  4.0  CL 107  CO2 24  GLUCOSE 111*  BUN 8  CREATININE 0.63  CALCIUM 9.2   GFR: Estimated Creatinine Clearance: 105.9 mL/min (by C-G formula based on SCr of 0.63 mg/dL). Liver Function Tests: Recent Labs  Lab 04/11/20 1016  AST 41  ALT 40  ALKPHOS 64  BILITOT 0.6  PROT 7.0  ALBUMIN 3.8   Recent Labs  Lab 04/11/20 1016  LIPASE 33   No results for input(s): AMMONIA in the last 168 hours. Coagulation Profile: Recent Labs  Lab 04/11/20 1016  INR 1.0   Cardiac Enzymes: No results for input(s): CKTOTAL, CKMB, CKMBINDEX, TROPONINI in the last 168 hours. BNP (last 3 results) No results for input(s): PROBNP in the last 8760 hours. HbA1C: No results for input(s): HGBA1C in the last 72 hours. CBG: No results for input(s): GLUCAP in the last 168 hours. Lipid Profile: No results for input(s): CHOL, HDL, LDLCALC, TRIG, CHOLHDL, LDLDIRECT in the last 72 hours. Thyroid Function Tests: No results for input(s): TSH, T4TOTAL, FREET4, T3FREE, THYROIDAB in the last 72 hours. Anemia Panel: No results for input(s): VITAMINB12, FOLATE, FERRITIN, TIBC, IRON, RETICCTPCT in the last 72 hours. Urine analysis:    Component Value Date/Time   COLORURINE YELLOW 12/12/2019 1506   APPEARANCEUR CLEAR 12/12/2019 1506   LABSPEC >=1.030 (A) 12/12/2019 1506   PHURINE 5.5 12/12/2019 [place] 12/12/2019 1506   HGBUR NEGATIVE 12/12/2019 [place] 12/12/2019 1506   KETONESUR NEGATIVE 12/12/2019 1506   UROBILINOGEN 0.2 12/12/2019 1506   NITRITE NEGATIVE 12/12/2019 1506   LEUKOCYTESUR NEGATIVE 12/12/2019 1506   Sepsis Labs: No results found for this or any previous visit (from the past 240 hour(s)).   Radiological Exams on Admission: DG Chest 2 View  Result Date: 04/11/2020 CLINICAL DATA:  Syncopal episode for 2 minutes, intermittent LEFT chest pain over last week or 2 radiating to LEFT axilla, pale and diaphoretic, history hypertension, smoker, prostate cancer  EXAM: CHEST - 2 VIEW COMPARISON:  12/21/2014, 04/03/2020 FINDINGS: Normal heart size and pulmonary vascularity. Abnormal enlargement of LEFT pulmonary hilum. Emphysematous and bronchitic changes consistent with COPD. Asymmetric apical scarring greater on LEFT. No acute infiltrate, pleural  effusion or pneumothorax. Probable BILATERAL nipple shadows. Bones demineralized. IMPRESSION: COPD changes with enlargement of LEFT pulmonary hilum, cannot exclude perihilar mass or hilar adenopathy; CT chest with contrast recommended for further assessment. Remainder of exam unremarkable. Findings called to Dr. Johnney Killian on 04/11/2020 at 1038 hours. Electronically Signed   By: Lavonia Dana M.D.   On: 04/11/2020 10:37   CT Angio Chest PE W/Cm &/Or Wo Cm  Result Date: 04/11/2020 CLINICAL DATA:  Chest pain and SOB. Suspected lung mass. EXAM: CT ANGIOGRAPHY CHEST WITH CONTRAST TECHNIQUE: Multidetector CT imaging of the chest was performed using the standard protocol during bolus administration of intravenous contrast. Multiplanar CT image reconstructions and MIPs were obtained to evaluate the vascular anatomy. CONTRAST:  159mL OMNIPAQUE IOHEXOL 350 MG/ML SOLN COMPARISON:  Chest radiograph from 04/11/2020 FINDINGS: Cardiovascular: Satisfactory opacification of the pulmonary arteries to the segmental level. No evidence of pulmonary embolism. Normal heart size. No pericardial effusion. Aortic atherosclerosis. Lad, left circumflex and RCA coronary artery calcifications. Mediastinum/Nodes: Normal appearance of the thyroid gland. The trachea appears patent and is midline. Normal appearance of the esophagus. Left hilar/AP window mass measures 4.7 x 4.5 by 4.8 cm. This invades the mediastinum and left hilum. There is encasement of the left upper lobe pulmonary arteries with significant luminal narrowing. Encasement of the distal left mainstem bronchus and its branches are also identified, image 81/9. Adjacent left pre-vascular lymph node is  borderline enlarged measuring 1.3 cm, image 61/6. No subcarinal, right paratracheal or right hilar adenopathy. No supraclavicular or axillary adenopathy. Lungs/Pleura: No pleural effusion. Centrilobular and paraseptal emphysema identified. Biapical pleuroparenchymal scarring noted. Within the anteromedial left upper lobe there is a subpleural nodular density which is nonspecific measuring 1.5 x 0.9 cm, image 77/7. Patchy areas of ground-glass attenuation are noted within the upper lobes. Index ground-glass density within the left upper lobe measures 2.0 x 1.1 cm, image 48/7. Upper Abdomen: No acute findings within the imaged portions of the upper abdomen. Musculoskeletal: Thoracolumbar scoliosis. No suspicious or acute bone lesions. Review of the MIP images confirms the above findings. IMPRESSION: 1. No evidence for acute pulmonary embolus. 2. Left hilar/AP window mass is identified which invades the mediastinum and left hilum. Highly concerning for primary bronchogenic carcinoma. Encasement of the left upper lobe pulmonary arteries and its branches is noted with significant luminal narrowing. Also noted is a borderline enlarged left pre-vascular lymph node, which likely represents a metastatic lymph node. Recommend referral to pulmonary medicine/thoracic surgery multi disciplinary thoracic oncology group for further workup and management. 3. There is a nonspecific subpleural nodular density in the anteromedial left upper lobe measuring 1.5 cm. This could be further addressed at PET-CT. 4. Patchy areas of ground-glass attenuation are noted within the upper lobes. These are nonspecific and without previous imaging for comparison may be inflammatory or infectious in etiology. 5. Emphysema and aortic atherosclerosis. Coronary artery calcifications. Aortic Atherosclerosis (ICD10-I70.0) and Emphysema (ICD10-J43.9). Electronically Signed   By: Kerby Moors M.D.   On: 04/11/2020 13:22    EKG: Independently reviewed.   Sinus rhythm at 80 bpm with ST elevation in multiple leads.  Assessment/Plan Syncope: Acute.  Patient reports feeling dizzy with diaphoresis prior to syncopal episode.  Troponins were otherwise noted being negative and EKG -Admit to medical telemetry bed. -Follow-up echocardiogram -Follow-up telemetry overnight  Lung mass: Acute.  Patient found to have a left upper lobe lung mass with signs of possible metastatic disease concerning for bronchogenic carcinoma.  PCCM consulted for further evaluation and they plan  on taking patient for bronchoscopy in a.m. on 5/21. -Follow-up MRI of the brain with contrast -N.p.o. after midnight for bronchoscopy in a.m. with PCCM -Appreciate PCCM consultative services, we will follow-up for further recommendation -Will need to be set up with oncology  Anxiety: Home medication include BuSpar 10 mg twice daily. -Continue BuSpar  Hyperlipidemia: Home medications include atorvastatin 40 mg daily. -Continue atorvastatin  Tobacco abuse: Patient admits to having a 39 smoking pack year history of tobacco. -Nicotine patch offered -Counseled on need of cessation of tobacco use  Alcohol abuse -CIWA protocol with scheduled Ativan  History of prostate cancer: s/p radioactive seed placement  DVT prophylaxis: Lovenox Code Status: Full Family Communication: Unable to reach wife over the phone Disposition Plan: Possible discharge home in 1 to 2 days Consults called: PCCM Admission status: observation  Norval Morton MD [facility] Pager [phone]   If 7PM-7AM, please contact night-coverage [email or web address] Password [email or web address]  04/11/2020, 3:24 PM

## 2020-04-11 NOTE — ED Notes (Signed)
Pt given crackers and coke until dinner tray arrives

## 2020-04-11 NOTE — Consult Note (Signed)
NAME:  Richard Santos, MRN:  [id number], DOB:  1961/09/06, LOS: 0 ADMISSION DATE:  04/11/2020, CONSULTATION DATE:  04/11/2020  REFERRING MD:  Dr.Pfeiffer, CHIEF COMPLAINT: Lung mass  Brief History   59 year old male initially presented with syncopal episode.  Preceding syncopal episode patient reported dizziness, chest tightness, and diaphoresis.  Work-up on arrival with CTA chest revealed left apical lung mass, PCCM consulted for further management and possible biopsy  History of present illness   Richard Santos is a 59 year old male with a past medical history significant for prior prostate cancer, hyperlipidemia, hypertension, and degenerative disc disease who presented to the hospital today after a syncopal episode at work. He reports feeling well this morning and in so presented to work, shortly after arrival he began feeling dizzy which was followed by chest tightness and diaphoresis this prompted call to EMS and transport to ED.  Prior to this event patient reports over the last 2 to 3 weeks he has been experiencing intermittent left upper chest pain described as "buring" in characteristic. When this progressed he presented to his PCP and a CXR was obtained and stress test was ordered but has not yet been completed.  On arrival to ED vital signs were normal including oxygenation. Lab work also relatively unremarkable as well including troponin x2. EKG also within normal limits, abnormalities seen. Due to syncopal episode CTA chest was obtain and showed a 4.7 x 4.5 x 4.8cm left hilar mass that invades the mediastinum and left hilum this mass also involves the left upper lobe pulmonary arteries with significant luminal narrowing seen.   Patient reports a current smoking history of 1PPD for approximately 30 years. He denies any recent unintentional weight loss, chills, or night sweats.  He reports daily early morning " smoker's cough" that is nonproductive and has not changed in characteristic.  Denies  any shortness of breath, wheezing, or hemoptysis.  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 Additional history includes daily alcohol consumption of 4-6 beers a day with occasional shots of liquor as well. He denies any prior alcohol withdrawal or seizures.   Past Medical History  Prostate cancer Hyperlipidemia Hypertension GERD Degenerative disc disease Anxiety  Significant Hospital Events   Admitted 5/20  Consults:  PCCM  Procedures:  Pending bronchoscopy 5/21  Significant Diagnostic Tests:  CTA chest 5/20 > Negative for acute PE but 4.7 x 4.5 x 4.8cm left hilar mass was seen that invades the mediastinum and left hilum this mass also involves the left upper lobe pulmonary arteries with significant luminal narrowing seen.   Micro Data:  Covid >   Antimicrobials:     Interim history/subjective:  Sitting up on the ED stretcher with no acute complaints.  Denies any dyspnea or current chest pain.  Objective   Blood pressure (!) 155/91, pulse 80, temperature 98.1 F (36.7 C), temperature source Oral, resp. rate (!) 21, height 6' (1.829 m), weight 74.4 kg, SpO2 99 %.       No intake or output data in the 24 hours ending 04/11/20 1548 Filed Weights   04/11/20 0943  Weight: 74.4 kg    Examination: General: Very pleasant middle-aged gentleman, sitting up on ED stretcher in no acute distress HEENT: Normocephalic/atraumatic, MM pink/moist, PERRL, sclera nonicteric Neuro: Alert and oriented x3, able to answer all questions appropriately, nonfocal CV: s1s2 regular rate and rhythm, no murmur, rubs, or gallops,  PULM: Clear to auscultation bilaterally, no added  breath sounds, no tachypnea, no increased work of breathing, no pleuritic chest pain, oxygen saturations 97 to 99% on room air GI: soft, bowel sounds active in all 4 quadrants, non-tender, non-distended Extremities: warm/dry, no edema  Skin: no rashes  or lesions  Resolved Hospital Problem list     Assessment & Plan:  Left apical lung mass -CTA chest obtained on admission to rule out PE given syncopal episode which was negative for PE but did reveal 4.7 x 4.5 x 4.8cm left hilar mass that invades the mediastinum and left hilum this mass also involves the left upper lobe pulmonary arteries with significant luminal narrowing seen.  -Patient has an extensive tobacco abuse history of 1 pack/day for approximately 30 years P: We will plan for bronchoscopy for biopsy tentatively 5/21 N.p.o. at midnight Currently stable on room air Encourage pulmonary hygiene Mobilize as able Monitor for hemoptysis  All other chronic medical conditions including Hypertension Hyperlipidemia Tobacco abuse EtOH abuse Degenerative disc disease To be managed by primary team  PCCM will continue to follow  Best practice:  Diet: Heart healthy diet, n.p.o. at midnight Pain/Anxiety/Delirium protocol (if indicated): As needed VAP protocol (if indicated): Not applicable DVT prophylaxis: SCDs GI prophylaxis: Not applicable Glucose control: Monitor Mobility: Up as tolerated Code Status: Full code Family Communication: Wife updated extensively at bedside Disposition: Per primary  Labs   CBC: Recent Labs  Lab 04/11/20 1016  WBC 7.6  NEUTROABS 5.6  HGB 13.0  HCT 38.5*  MCV 102.4*  PLT 073    Basic Metabolic Panel: Recent Labs  Lab 04/11/20 1016  NA 141  K 4.0  CL 107  CO2 24  GLUCOSE 111*  BUN 8  CREATININE 0.63  CALCIUM 9.2   GFR: Estimated Creatinine Clearance: 105.9 mL/min (by C-G formula based on SCr of 0.63 mg/dL). Recent Labs  Lab 04/11/20 1016  WBC 7.6    Liver Function Tests: Recent Labs  Lab 04/11/20 1016  AST 41  ALT 40  ALKPHOS 64  BILITOT 0.6  PROT 7.0  ALBUMIN 3.8   Recent Labs  Lab 04/11/20 1016  LIPASE 33   No results for input(s): AMMONIA in the last 168 hours.  ABG No results found for: PHART,  PCO2ART, PO2ART, HCO3, TCO2, ACIDBASEDEF, O2SAT   Coagulation Profile: Recent Labs  Lab 04/11/20 1016  INR 1.0    Cardiac Enzymes: No results for input(s): CKTOTAL, CKMB, CKMBINDEX, TROPONINI in the last 168 hours.  HbA1C: Hgb A1c MFr Bld  Date/Time Value Ref Range Status  12/12/2019 03:06 PM 5.4 4.6 - 6.5 % Final    Comment:    Glycemic Control Guidelines for People with Diabetes:Non Diabetic:  <6%Goal of Therapy: <7%Additional Action Suggested:  >8%   08/10/2018 08:58 AM 5.6 4.6 - 6.5 % Final    Comment:    Glycemic Control Guidelines for People with Diabetes:Non Diabetic:  <6%Goal of Therapy: <7%Additional Action Suggested:  >8%     CBG: No results for input(s): GLUCAP in the last 168 hours.  Review of Systems: Positives in bold  Gen: Denies fever, chills, weight change, fatigue, night sweats HEENT: Denies blurred vision, double vision, hearing loss, tinnitus, sinus congestion, rhinorrhea, sore throat, neck stiffness, dysphagia PULM: Denies shortness of breath, cough, sputum production, hemoptysis, wheezing CV: Denies chest pain, edema, orthopnea, paroxysmal nocturnal dyspnea, palpitations GI: Denies abdominal pain, nausea, vomiting, diarrhea, hematochezia, melena, constipation, change in bowel habits GU: Denies dysuria, hematuria, polyuria, oliguria, urethral discharge Endocrine: Denies hot or cold intolerance, polyuria, polyphagia  or appetite change Derm: Denies rash, dry skin, scaling or peeling skin change Heme: Denies easy bruising, bleeding, bleeding gums Neuro: Denies headache, numbness, weakness, slurred speech, loss of memory or consciousness  Past Medical History  He,  has a past medical history of Allergic rhinitis, Allergy, Anxiety, Chronic low back pain, DDD (degenerative disc disease), lumbar, ED (erectile dysfunction), Finger injury, GERD (gastroesophageal reflux disease), Heart murmur, HTN (hypertension) (08/20/2016), Hyperlipidemia, Incomplete right bundle  branch block, Kidney stones, Prostate cancer ([place]) (dx 10/02/14), Prostate cancer ([place]), Sigmoid diverticulosis, and Wears glasses.   Surgical History    Past Surgical History:  Procedure Laterality Date  . COLONOSCOPY    . COLONOSCOPY W/ POLYPECTOMY  04-19-2012  . ESOPHAGOGASTRODUODENOSCOPY  08-05-2010  . POLYPECTOMY    . PROSTATE BIOPSY  10/02/14  . RADIOACTIVE SEED IMPLANT N/A 01/30/2015   Procedure: RADIOACTIVE SEED IMPLANT;  Surgeon: Rana Snare, MD;  Location: [facility];  Service: Urology;  Laterality: N/A;  . UPPER GASTROINTESTINAL ENDOSCOPY       Social History   reports that he has been smoking cigarettes. He has a 25.00 pack-year smoking history. He has never used smokeless tobacco. He reports current alcohol use of about 49.0 standard drinks of alcohol per week. He reports that he does not use drugs.   Family History   His family history includes Breast cancer in his sister; Heart disease in his father, mother, and another family member; Prostate cancer in his father. There is no history of Colon cancer, Colon polyps, Esophageal cancer, Rectal cancer, or Stomach cancer.   Allergies Allergies  Allergen Reactions  . Bee Venom Swelling     Home Medications  Prior to Admission medications   Medication Sig Start Date End Date Taking? Authorizing Provider  aspirin 81 MG tablet Take 81 mg by mouth daily.   Yes [provider]  atorvastatin (LIPITOR) 40 MG tablet Take 1 tablet (40 mg total) by mouth daily. 12/15/19  Yes [name], MD  busPIRone (BUSPAR) 10 MG tablet Take 1 tablet (10 mg total) by mouth 2 (two) times daily. 08/16/19  Yes [name], MD  EPINEPHrine (EPIPEN) 0.3 mg/0.3 mL IJ SOAJ injection Inject 0.3 mLs (0.3 mg total) into the muscle once. 07/31/14  Yes [name], MD  ibuprofen (ADVIL,MOTRIN) 200 MG tablet Take 200 mg by mouth every 6 (six) hours as needed for pain. Takes two daily   Yes [provider]  losartan (COZAAR)  50 MG tablet Take 1 tablet (50 mg total) by mouth daily. 04/03/20  Yes [name], MD  meloxicam (MOBIC) 15 MG tablet Take 1 tablet (15 mg total) by mouth daily as needed for pain. 04/03/20  Yes [name], MD  Multiple Vitamin (MULTIVITAMIN) capsule Take 1 capsule by mouth daily.   Yes [provider]  pantoprazole (PROTONIX) 40 MG tablet Take 1 tablet (40 mg total) by mouth daily before breakfast. 08/16/19  Yes [name], MD  sildenafil (REVATIO) 20 MG tablet Take 3-5 tabs by mouth daily as needed 08/16/19  Yes [name], MD  traMADol (ULTRAM) 50 MG tablet TAKE HALF TABLET BY MOUTH 2 TIMES DAILY AS NEEDED FOR PAIN 01/24/20  Yes [name], MD  cetirizine (ZYRTEC) 10 MG tablet Take 1 tablet (10 mg total) by mouth daily. Patient not taking: Reported on 04/11/2020 08/16/19   [name], MD  tamsulosin (FLOMAX) 0.4 MG CAPS capsule Take 1 capsule (0.4 mg total) by mouth  daily. Patient not taking: Reported on 04/11/2020 08/16/19   [name], MD     Signature   Johnsie Cancel, NP-C [facility] Contact / Pager information can be found on Amion  04/11/2020, 4:37 PM

## 2020-04-11 NOTE — Progress Notes (Signed)
Responded to code stemi to support patient and staff.  Patient is alert and communicating with staff. No immediate need from chaplain.  Provided ministry of presence.  Will follow as needed.  Jaclynn Major, [place], [facility], Pager [phone]

## 2020-04-11 NOTE — ED Notes (Signed)
Spoke with lab regarding pt troponin and metabolic. Lab unsure why it is not resulted.

## 2020-04-11 NOTE — ED Notes (Signed)
Patient transported to CT 

## 2020-04-11 NOTE — ED Notes (Signed)
Pt reports that for the last week or two he has had intermittent L sided chest pain radiating to his L axilla.

## 2020-04-11 NOTE — ED Triage Notes (Signed)
Pt bib ems after syncopal X2 minutes. Assisted to the floor. Pale and diaphoretic initially on scene. Chest tightness at that time. Pt pain free on arrival. 12 lead initially V3-V5 elevation. Repeat ekg elevation in V3 and V4.  324mg  aspirin given pta.  132/78 HR 85 CBG 149 97% RA

## 2020-04-11 NOTE — ED Provider Notes (Signed)
[facility] DEPART[facility] Provider Note   CSN: [id number] Arrival date & time: 04/11/20  3818     History Chief Complaint  Patient presents with  . Chest Pain    Richard Santos is a 59 y.o. male.  HPI Patient reports 2 weeks ago he had an episode of chest pain that was fairly short-lived.  He had a central tightness and pressure.  This resolved on its own.  Today, he had a fairly severe pain that was in the center of his chest and persisted.  He was at work and in conjunction with this started to get nauseated and sweaty.  He went and sat down in the office and reports for a brief.  He apparently passed out.  He awakened his boss saying "he is not breathing".  He reports the pain has resolved now and he feels essentially back to normal.  He however advises that for several weeks he has been getting a sharp electric-like pain that shoots from his upper anterior left chest to about his armpit.  He reports at first he thought maybe was a pinched nerve but it persisted.  He reports he saw his doctor and he had a stress test that was normal without any concerning findings at that time.  He reports he did not feel well on Monday and Tuesday of this week.  He drove to [place] to visit his mother.  He has not had any swelling or pain in his legs.  No fevers, no chills, no cough.    Past Medical History:  Diagnosis Date  . Allergic rhinitis   . Allergy   . Anxiety   . Chronic low back pain   . DDD (degenerative disc disease), lumbar   . ED (erectile dysfunction)   . Finger injury    cut pads off 3rd and 4th finger left hand/due to lawn mower accident  . GERD (gastroesophageal reflux disease)   . Heart murmur    as child only-   . HTN (hypertension) 08/20/2016  . Hyperlipidemia   . Incomplete right bundle branch block   . Kidney stones   . Prostate cancer ([place]) dx 10/02/14   stage T1c  . Prostate cancer ([place])   . Sigmoid diverticulosis   . Wears glasses      Patient Active Problem List   Diagnosis Date Noted  . Syncope 04/11/2020  . Chest pain 04/03/2020  . Vitamin D deficiency 12/15/2019  . Dysphagia 07/17/2017  . HTN (hypertension) 08/20/2016  . Malignant neoplasm of prostate ([place]) 11/29/2014  . Bee sting allergy 07/31/2014  . Elevated PSA 04/14/2013  . Lumbar disc disease 04/14/2013  . Personal history of colonic adenoma 04/26/2012  . Impaired glucose tolerance 02/27/2012  . Preventative health care 02/27/2012  . Chronic low back pain   . Alcohol abuse 12/31/2010  . 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 Hyperlipidemia 01/16/2008  . ANXIETY 01/16/2008  . ERECTILE DYSFUNCTION 01/16/2008  . SMOKER 01/16/2008  . ALLERGIC RHINITIS 01/16/2008  . GERD 01/16/2008    Past Surgical History:  Procedure Laterality Date  . COLONOSCOPY    . COLONOSCOPY W/ POLYPECTOMY  04-19-2012  . ESOPHAGOGASTRODUODENOSCOPY  08-05-2010  . POLYPECTOMY    . PROSTATE BIOPSY  10/02/14  . RADIOACTIVE SEED IMPLANT N/A 01/30/2015   Procedure: RADIOACTIVE SEED IMPLANT;  Surgeon: Rana Snare, MD;  Location: [facility];  Service: Urology;  Laterality: N/A;  . UPPER GASTROINTESTINAL ENDOSCOPY  Family History  Problem Relation Age of Onset  . Prostate cancer Father   . Heart disease Father   . Heart disease Mother   . Breast cancer Sister   . Heart disease Other   . Colon cancer Neg Hx   . Colon polyps Neg Hx   . Esophageal cancer Neg Hx   . Rectal cancer Neg Hx   . Stomach cancer Neg Hx     Social History   Tobacco Use  . Smoking status: Current Every Day Smoker    Packs/day: 1.00    Years: 25.00    Pack years: 25.00    Types: Cigarettes  . Smokeless tobacco: Never Used  Substance Use Topics  . Alcohol use: Yes    Alcohol/week: 49.0 standard drinks    Types: 25 Cans of beer, 24 Standard drinks or equivalent per week    Comment: 5 beer per day  . Drug use: No    Home Medications Prior to Admission medications    Medication Sig Start Date End Date Taking? Authorizing Provider  aspirin 81 MG tablet Take 81 mg by mouth daily.   Yes [provider]  atorvastatin (LIPITOR) 40 MG tablet Take 1 tablet (40 mg total) by mouth daily. 12/15/19  Yes [name], MD  busPIRone (BUSPAR) 10 MG tablet Take 1 tablet (10 mg total) by mouth 2 (two) times daily. 08/16/19  Yes [name], MD  EPINEPHrine (EPIPEN) 0.3 mg/0.3 mL IJ SOAJ injection Inject 0.3 mLs (0.3 mg total) into the muscle once. 07/31/14  Yes [name], MD  ibuprofen (ADVIL,MOTRIN) 200 MG tablet Take 200 mg by mouth every 6 (six) hours as needed for pain. Takes two daily   Yes [provider]  losartan (COZAAR) 50 MG tablet Take 1 tablet (50 mg total) by mouth daily. 04/03/20  Yes [name], MD  meloxicam (MOBIC) 15 MG tablet Take 1 tablet (15 mg total) by mouth daily as needed for pain. 04/03/20  Yes [name], MD  Multiple Vitamin (MULTIVITAMIN) capsule Take 1 capsule by mouth daily.   Yes [provider]  pantoprazole (PROTONIX) 40 MG tablet Take 1 tablet (40 mg total) by mouth daily before breakfast. 08/16/19  Yes [name], MD  sildenafil (REVATIO) 20 MG tablet Take 3-5 tabs by mouth daily as needed 08/16/19  Yes [name], MD  traMADol (ULTRAM) 50 MG tablet TAKE HALF TABLET BY MOUTH 2 TIMES DAILY AS NEEDED FOR PAIN 01/24/20  Yes [name], MD  cetirizine (ZYRTEC) 10 MG tablet Take 1 tablet (10 mg total) by mouth daily. Patient not taking: Reported on 04/11/2020 08/16/19   [name], MD  tamsulosin (FLOMAX) 0.4 MG CAPS capsule Take 1 capsule (0.4 mg total) by mouth daily. Patient not taking: Reported on 04/11/2020 08/16/19   [name], MD    Allergies    Bee venom  Review of Systems   Review of Systems 10 systems reviewed and negative except as per HPI Physical Exam Updated Vital Signs BP 137/85   Pulse 79   Temp 98.1 F (36.7 C) (Oral)   Resp 16   Ht 6' (1.829 m)   Wt 74.4 kg   SpO2 99%    BMI 22.24 kg/m   Physical Exam Constitutional:      Comments: Patient is alert and appropriate.  No confusion.  No respiratory distress at rest.  HENT:     Head: Normocephalic and atraumatic.  Right Ear: External ear normal.     Left Ear: External ear normal.     Nose: Nose normal.     Mouth/Throat:     Mouth: Mucous membranes are moist.     Pharynx: Oropharynx is clear.  Eyes:     Extraocular Movements: Extraocular movements intact.  Neck:     Comments: No cervical lymphadenopathy.  No tenderness of the soft tissues of the neck or the base of the neck. Cardiovascular:     Rate and Rhythm: Normal rate and regular rhythm.     Pulses: Normal pulses.     Heart sounds: Normal heart sounds.  Pulmonary:     Effort: Pulmonary effort is normal.     Breath sounds: Normal breath sounds.  Abdominal:     General: There is no distension.     Palpations: Abdomen is soft.     Tenderness: There is no abdominal tenderness. There is no guarding.  Musculoskeletal:        General: No swelling or tenderness. Normal range of motion.     Cervical back: Neck supple. No rigidity or tenderness.     Right lower leg: No edema.     Left lower leg: No edema.     Comments: No lower extremity edema.  Calves are soft nontender.  No upper extremity swelling.  Lymphadenopathy:     Cervical: No cervical adenopathy.  Neurological:     General: No focal deficit present.     Mental Status: He is oriented to person, place, and time.     Motor: No weakness.     Coordination: Coordination normal.  Psychiatric:        Mood and Affect: Mood normal.     ED Results / Procedures / Treatments   Labs (all labs ordered are listed, but only abnormal results are displayed) Labs Reviewed  COMPREHENSIVE METABOLIC PANEL - Abnormal; Notable for the following components:      Result Value   Glucose, Bld 111 (*)    All other components within normal limits  CBC WITH DIFFERENTIAL/PLATELET - Abnormal; Notable for  the following components:   RBC 3.76 (*)    HCT 38.5 (*)    MCV 102.4 (*)    MCH 34.6 (*)    All other components within normal limits  D-DIMER, QUANTITATIVE (NOT AT [facility]) - Abnormal; Notable for the following components:   D-Dimer, Quant 0.71 (*)    All other components within normal limits  SARS CORONAVIRUS 2 BY RT PCR (HOSPITAL ORDER, [facility])  LIPASE, BLOOD  PROTIME-INR  TROPONIN I (HIGH SENSITIVITY)  TROPONIN I (HIGH SENSITIVITY)    EKG EKG Interpretation  Date/Time:  Thursday Apr 11 2020 09:45:41 EDT Ventricular Rate:  80 PR Interval:    QRS Duration: 100 QT Interval:  380 QTC Calculation: 439 R Axis:   68 Text Interpretation: Sinus rhythm S1,S2,S3 pattern Confirmed by Charlesetta Shanks [phone]) on 04/11/2020 4:32:17 PM   Radiology DG Chest 2 View  Result Date: 04/11/2020 CLINICAL DATA:  Syncopal episode for 2 minutes, intermittent LEFT chest pain over last week or 2 radiating to LEFT axilla, pale and diaphoretic, history hypertension, smoker, prostate cancer EXAM: CHEST - 2 VIEW COMPARISON:  12/21/2014, 04/03/2020 FINDINGS: Normal heart size and pulmonary vascularity. Abnormal enlargement of LEFT pulmonary hilum. Emphysematous and bronchitic changes consistent with COPD. Asymmetric apical scarring greater on LEFT. No acute infiltrate, pleural effusion or pneumothorax. Probable BILATERAL nipple shadows. Bones demineralized. IMPRESSION: COPD changes with enlargement  of LEFT pulmonary hilum, cannot exclude perihilar mass or hilar adenopathy; CT chest with contrast recommended for further assessment. Remainder of exam unremarkable. Findings called to Dr. Johnney Killian on 04/11/2020 at 1038 hours. Electronically Signed   By: Lavonia Dana M.D.   On: 04/11/2020 10:37   CT Angio Chest PE W/Cm &/Or Wo Cm  Result Date: 04/11/2020 CLINICAL DATA:  Chest pain and SOB. Suspected lung mass. EXAM: CT ANGIOGRAPHY CHEST WITH CONTRAST TECHNIQUE: Multidetector CT imaging of  the chest was performed using the standard protocol during bolus administration of intravenous contrast. Multiplanar CT image reconstructions and MIPs were obtained to evaluate the vascular anatomy. CONTRAST:  120mL OMNIPAQUE IOHEXOL 350 MG/ML SOLN COMPARISON:  Chest radiograph from 04/11/2020 FINDINGS: Cardiovascular: Satisfactory opacification of the pulmonary arteries to the segmental level. No evidence of pulmonary embolism. Normal heart size. No pericardial effusion. Aortic atherosclerosis. Lad, left circumflex and RCA coronary artery calcifications. Mediastinum/Nodes: Normal appearance of the thyroid gland. The trachea appears patent and is midline. Normal appearance of the esophagus. Left hilar/AP window mass measures 4.7 x 4.5 by 4.8 cm. This invades the mediastinum and left hilum. There is encasement of the left upper lobe pulmonary arteries with significant luminal narrowing. Encasement of the distal left mainstem bronchus and its branches are also identified, image 81/9. Adjacent left pre-vascular lymph node is borderline enlarged measuring 1.3 cm, image 61/6. No subcarinal, right paratracheal or right hilar adenopathy. No supraclavicular or axillary adenopathy. Lungs/Pleura: No pleural effusion. Centrilobular and paraseptal emphysema identified. Biapical pleuroparenchymal scarring noted. Within the anteromedial left upper lobe there is a subpleural nodular density which is nonspecific measuring 1.5 x 0.9 cm, image 77/7. Patchy areas of ground-glass attenuation are noted within the upper lobes. Index ground-glass density within the left upper lobe measures 2.0 x 1.1 cm, image 48/7. Upper Abdomen: No acute findings within the imaged portions of the upper abdomen. Musculoskeletal: Thoracolumbar scoliosis. No suspicious or acute bone lesions. Review of the MIP images confirms the above findings. IMPRESSION: 1. No evidence for acute pulmonary embolus. 2. Left hilar/AP window mass is identified which invades  the mediastinum and left hilum. Highly concerning for primary bronchogenic carcinoma. Encasement of the left upper lobe pulmonary arteries and its branches is noted with significant luminal narrowing. Also noted is a borderline enlarged left pre-vascular lymph node, which likely represents a metastatic lymph node. Recommend referral to pulmonary medicine/thoracic surgery multi disciplinary thoracic oncology group for further workup and management. 3. There is a nonspecific subpleural nodular density in the anteromedial left upper lobe measuring 1.5 cm. This could be further addressed at PET-CT. 4. Patchy areas of ground-glass attenuation are noted within the upper lobes. These are nonspecific and without previous imaging for comparison may be inflammatory or infectious in etiology. 5. Emphysema and aortic atherosclerosis. Coronary artery calcifications. Aortic Atherosclerosis (ICD10-I70.0) and Emphysema (ICD10-J43.9). Electronically Signed   By: Kerby Moors M.D.   On: 04/11/2020 13:22    Procedures Procedures (including critical care time) No critical care time Medications Ordered in ED Medications  traMADol (ULTRAM) tablet 50 mg (has no administration in time range)  busPIRone (BUSPAR) tablet 10 mg (has no administration in time range)  atorvastatin (LIPITOR) tablet 40 mg (has no administration in time range)  enoxaparin (LOVENOX) injection 40 mg (has no administration in time range)  sodium chloride flush (NS) 0.9 % injection 3 mL (has no administration in time range)  ondansetron (ZOFRAN) tablet 4 mg (has no administration in time range)    Or  ondansetron (  ZOFRAN) injection 4 mg (has no administration in time range)  acetaminophen (TYLENOL) tablet 650 mg (has no administration in time range)    Or  acetaminophen (TYLENOL) suppository 650 mg (has no administration in time range)  albuterol (PROVENTIL) (2.5 MG/3ML) 0.083% nebulizer solution 2.5 mg (has no administration in time range)   iohexol (OMNIPAQUE) 350 MG/ML injection 100 mL (100 mLs Intravenous Contrast Given 04/11/20 1254)    ED Course  I have reviewed the triage vital signs and the nursing notes.  Pertinent labs & imaging results that were available during my care of the patient were reviewed by me and considered in my medical decision making (see chart for details).  Clinical Course as of Apr 11 1638  Thu Apr 11, 2020  1433 Consult: Reviewed with Dr. Kipp Brood cardiothoracic surgery.  Recommends consultation with pulmonology for bronchoscopy with biopsy for diagnostic purposes.  With significant vascular encasement, likely not amenable to surgical resection.   [MP]  1521 Consult: Dr. Tamala Julian [facility] hospitalist for admission.   [MP]    Clinical Course User Index [MP] Charlesetta Shanks, MD  Consult: Reviewed with Loree Fee for critical care.  Critical care will consult regarding further evaluation and management of mediastinal mass concerning for primary lung cancer. MDM Rules/Calculators/A&P                      Patient has had chest pain on and off for 2 weeks.  Today he had an episode that included syncope with unresponsiveness.  CT scan shows mediastinal mass compromising pulmonary arteries.  Upon arrival to the emergency department patient symptoms have significantly improved.  He no longer was diaphoretic or short of breath.  He was not having active, severe chest pain.  Patient continued to be monitored during course of work-up without significant change.  After establishing diagnosis of mediastinal malignancy with probable compromise of pulmonary arteries, consultations made to cardiothoracic surgery, pulmonary critical care and hospitalist service for admission.  Patient had been updated on findings on CT scan with plan for further evaluation by specialty services.  Patient's wife was not in the room at that time.  Patient advised that there would also be additional specialty providers to help clarify next steps in  diagnostic process and treatment. 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

## 2020-04-12 ENCOUNTER — Inpatient Hospital Stay (HOSPITAL_COMMUNITY): Payer: 59 | Admitting: Certified Registered"

## 2020-04-12 ENCOUNTER — Other Ambulatory Visit: Payer: Self-pay | Admitting: Oncology

## 2020-04-12 ENCOUNTER — Encounter (HOSPITAL_COMMUNITY): Payer: Self-pay | Admitting: Internal Medicine

## 2020-04-12 ENCOUNTER — Inpatient Hospital Stay (HOSPITAL_COMMUNITY): Payer: 59

## 2020-04-12 ENCOUNTER — Other Ambulatory Visit: Payer: Self-pay | Admitting: Radiation Therapy

## 2020-04-12 ENCOUNTER — Encounter (HOSPITAL_COMMUNITY)
Admission: EM | Disposition: A | Discharge status: Home / Self Care | Payer: Self-pay | Source: Home / Self Care | Attending: Internal Medicine

## 2020-04-12 DIAGNOSIS — R55 Syncope and collapse: Secondary | ICD-10-CM

## 2020-04-12 DIAGNOSIS — R918 Other nonspecific abnormal finding of lung field: Secondary | ICD-10-CM

## 2020-04-12 HISTORY — PX: VIDEO BRONCHOSCOPY: SHX5072

## 2020-04-12 HISTORY — PX: HEMOSTASIS CONTROL: SHX6838

## 2020-04-12 HISTORY — PX: BRONCHIAL WASHINGS: SHX5105

## 2020-04-12 HISTORY — PX: BRONCHIAL BIOPSY: SHX5109

## 2020-04-12 HISTORY — PX: ENDOBRONCHIAL ULTRASOUND: SHX5096

## 2020-04-12 HISTORY — PX: BRONCHIAL BRUSHINGS: SHX5108

## 2020-04-12 HISTORY — PX: BRONCHIAL NEEDLE ASPIRATION BIOPSY: SHX5106

## 2020-04-12 LAB — CBC
HCT: 37.4 % — ABNORMAL LOW (ref 39.0–52.0)
Hemoglobin: 12.5 g/dL — ABNORMAL LOW (ref 13.0–17.0)
MCH: 34 pg (ref 26.0–34.0)
MCHC: 33.4 g/dL (ref 30.0–36.0)
MCV: 101.6 fL — ABNORMAL HIGH (ref 80.0–100.0)
Platelets: 204 10*3/uL (ref 150–400)
RBC: 3.68 MIL/uL — ABNORMAL LOW (ref 4.22–5.81)
RDW: 12.5 % (ref 11.5–15.5)
WBC: 7.1 10*3/uL (ref 4.0–10.5)
nRBC: 0 % (ref 0.0–0.2)

## 2020-04-12 LAB — BASIC METABOLIC PANEL
Anion gap: 7 (ref 5–15)
BUN: 10 mg/dL (ref 6–20)
CO2: 27 mmol/L (ref 22–32)
Calcium: 9 mg/dL (ref 8.9–10.3)
Chloride: 104 mmol/L (ref 98–111)
Creatinine, Ser: 0.71 mg/dL (ref 0.61–1.24)
GFR calc Af Amer: >60 mL/min (ref >60)
GFR calc non Af Amer: >60 mL/min (ref >60)
Glucose, Bld: 102 mg/dL — ABNORMAL HIGH (ref 70–99)
Potassium: 3.7 mmol/L (ref 3.5–5.1)
Sodium: 138 mmol/L (ref 135–145)

## 2020-04-12 LAB — MAGNESIUM: Magnesium: 1.7 mg/dL (ref 1.7–2.4)

## 2020-04-12 LAB — ECHOCARDIOGRAM COMPLETE
Height: 72 in
Weight: 2568 oz

## 2020-04-12 LAB — PHOSPHORUS: Phosphorus: 4.2 mg/dL (ref 2.5–4.6)

## 2020-04-12 SURGERY — VIDEO BRONCHOSCOPY WITHOUT FLUORO
Anesthesia: General

## 2020-04-12 MED ORDER — MIDAZOLAM HCL 2 MG/2ML IJ SOLN
INTRAMUSCULAR | Status: DC | PRN
  Administered 2020-04-12: 2 mg via INTRAVENOUS

## 2020-04-12 MED ORDER — SUGAMMADEX SODIUM 200 MG/2ML IV SOLN
INTRAVENOUS | Status: DC | PRN
  Administered 2020-04-12: 200 mg via INTRAVENOUS

## 2020-04-12 MED ORDER — PHENYLEPHRINE 40 MCG/ML (10ML) SYRINGE FOR IV PUSH (FOR BLOOD PRESSURE SUPPORT)
PREFILLED_SYRINGE | INTRAVENOUS | Status: DC | PRN
  Administered 2020-04-12: 120 ug via INTRAVENOUS
  Administered 2020-04-12: 160 ug via INTRAVENOUS
  Administered 2020-04-12: 120 ug via INTRAVENOUS

## 2020-04-12 MED ORDER — PROPOFOL 10 MG/ML IV BOLUS
INTRAVENOUS | Status: DC | PRN
  Administered 2020-04-12: 150 mg via INTRAVENOUS
  Administered 2020-04-12: 30 mg via INTRAVENOUS
  Administered 2020-04-12: 20 mg via INTRAVENOUS

## 2020-04-12 MED ORDER — FENTANYL CITRATE (PF) 100 MCG/2ML IJ SOLN
INTRAMUSCULAR | Status: DC | PRN
  Administered 2020-04-12 (×2): 50 ug via INTRAVENOUS

## 2020-04-12 MED ORDER — DEXAMETHASONE SODIUM PHOSPHATE 10 MG/ML IJ SOLN
INTRAMUSCULAR | Status: DC | PRN
  Administered 2020-04-12: 10 mg via INTRAVENOUS

## 2020-04-12 MED ORDER — ACETAMINOPHEN 325 MG PO TABS: 650.0000 mg | ORAL_TABLET | Freq: Four times a day (QID) | ORAL | 0 refills | Status: DC | PRN

## 2020-04-12 MED ORDER — ROCURONIUM BROMIDE 10 MG/ML (PF) SYRINGE
PREFILLED_SYRINGE | INTRAVENOUS | Status: DC | PRN
  Administered 2020-04-12: 20 mg via INTRAVENOUS
  Administered 2020-04-12: 40 mg via INTRAVENOUS

## 2020-04-12 MED ORDER — LIDOCAINE 2% (20 MG/ML) 5 ML SYRINGE
INTRAMUSCULAR | Status: DC | PRN
  Administered 2020-04-12: 60 mg via INTRAVENOUS

## 2020-04-12 MED ORDER — NICOTINE 21 MG/24HR TD PT24: 21.0000 mg | MEDICATED_PATCH | Freq: Every day | TRANSDERMAL | 0 refills | Status: DC

## 2020-04-12 MED ORDER — FOLIC ACID 1 MG PO TABS: 1.0000 mg | ORAL_TABLET | Freq: Every day | ORAL | 0 refills | Status: DC

## 2020-04-12 MED ORDER — LACTATED RINGERS IV SOLN: INTRAVENOUS | Status: DC | PRN

## 2020-04-12 MED ORDER — ONDANSETRON HCL 4 MG/2ML IJ SOLN
INTRAMUSCULAR | Status: DC | PRN
  Administered 2020-04-12: 4 mg via INTRAVENOUS

## 2020-04-12 NOTE — Op Note (Signed)
[facility] Cardiopulmonary Patient Name: Richard Santos Date: 04/12/2020 MRN: [id number] Attending MD: Marshell Garfinkel , MD Date of Birth: 1961-08-15 CSN: Finalized Age: 59 Admit Type: Inpatient Gender: Male Procedure:             Bronchoscopy Indications:           Abnormal CT scan of chest Providers:             Marshell Garfinkel, MD, Grace Isaac, RN, Cherylynn Ridges,                         Technician Referring MD:           Medicines:             General Anesthesia Complications:         No immediate complications Estimated Blood Loss:  Estimated blood loss: none. Procedure:             Pre-Anesthesia Assessment:                        - A History and Physical has been performed. Patient                         meds and allergies have been reviewed. The risks and                         benefits of the procedure and the sedation options and                         risks were discussed with the patient. All questions                         were answered and informed consent was obtained.                         Patient identification and proposed procedure were                         verified prior to the procedure in the pre-procedure                         area. Mental Status Examination: alert and oriented.                         Airway Examination: normal oropharyngeal airway.                         Respiratory Examination: clear to auscultation. CV                         Examination: normal. ASA Grade Assessment: II - A                         patient with mild systemic disease. After reviewing                         the risks and benefits, the patient was deemed in  satisfactory condition to undergo the procedure. The                         anesthesia plan was to use general anesthesia.                         Immediately prior to administration of medications,                         the patient was re-assessed for adequacy  to receive                         sedatives. The heart rate, respiratory rate, oxygen                         saturations, blood pressure, adequacy of pulmonary                         ventilation, and response to care were monitored                         throughout the procedure. The physical status of the                         patient was re-assessed after the procedure.                        After obtaining informed consent, the bronchoscope was                         passed under direct vision. Throughout the procedure,                         the patient's blood pressure, pulse, and oxygen                         saturations were monitored continuously. the [id number]                         ([id number]) Olympus Therapeutic Bronchoscope was                         introduced through the right nostril and advanced to                         the tracheobronchial tree of both lungs. the [id number]                         ([id number]) Olympus EBUS scope was introduced through                         the and advanced to the. Scope In: Scope Out: Findings:      Left Lung Abnormalities: Extrinsic compression was found in the left       upper lobe. The airway is moderately narrowed.      Left Lung Abnormalities: A small non-obstructing fungating lesion was       found proximally, at the orifice in the left upper lobe.      Bronchoalveolar lavage  was performed in the LUL apical posterior       segments (B1 & B2) and in the LUL superior lingular segment (B4) of the       lung and sent for cell count, bacterial culture, viral smears & culture,       and fungal & AFB analysis and cytology. 180 mL of fluid were instilled.       60 mL were returned. The return was blood-tinged. There were no mucoid       plugs in the return fluid. Multiple specimens were obtained and pooled       into one specimen, which was sent for analysis.      Brushings of a lesion were obtained in the left upper lobe with a        cytology brush and sent for routine cytology. Three samples were       obtained.      Endobronchial biopsies of a lesion were performed in the left upper lobe       using a forceps and a 20 gauge needle and sent for histopathology       examination. Five samples were obtained.      An endobronchial ultrasound endoscope was utilized in order to assist       with guiding the biopsy needle and better characterize the lesion in the       left hilum and biopsy obtained Impression:            - Abnormal CT scan of chest                        - Extrinsic compression was found in the left upper                         lobe secondary to a mass.                        - A lesion was found in the left upper lobe.                        - Bronchoalveolar lavage was performed.                        - Brushings were obtained.                        - An endobronchial biopsy was performed.                        - Endobronchial ultrasound was performed to biopsy                         left hilar mass Moderate Sedation:      GA Recommendation:        - Await BAL, biopsy and brushing results. Procedure Code(s):     --- Professional ---                        [phone], Bronchoscopy, rigid or flexible, including                         fluoroscopic guidance, when performed; with bronchial  or endobronchial biopsy(s), single or multiple sites                        [id number], Bronchoscopy, rigid or flexible, including                         fluoroscopic guidance, when performed; with bronchial                         alveolar lavage                        [phone], Bronchoscopy, rigid or flexible, including                         fluoroscopic guidance, when performed; with brushing                         or protected brushings                        [id number], Bronchoscopy, rigid or flexible, including                         fluoroscopic guidance, when performed; with                          transendoscopic endobronchial ultrasound (EBUS) during                         bronchoscopic diagnostic or therapeutic                         intervention(s) for peripheral lesion(s) (List                         separately in addition to code for primary                         procedure[s]) Diagnosis Code(s):     --- Professional ---                        R93.89, Abnormal findings on diagnostic imaging of                         other specified body structures CPT copyright 2019 [employer] Association. All rights reserved. The codes documented in this report are preliminary and upon coder review may  be revised to meet current compliance requirements. Marshell Garfinkel, MD 04/12/2020 2:39:05 PM Number of Addenda: 0

## 2020-04-12 NOTE — Transfer of Care (Signed)
Immediate Anesthesia Transfer of Care Note  Patient: SHAMAN MUSCARELLA  Procedure(s) Performed: VIDEO BRONCHOSCOPY WITHOUT FLUORO (N/A ) ENDOBRONCHIAL ULTRASOUND (N/A ) BRONCHIAL WASHINGS BRONCHIAL BRUSHINGS BRONCHIAL NEEDLE ASPIRATION BIOPSIES HEMOSTASIS CONTROL BRONCHIAL BIOPSIES  Patient Location: Endoscopy Unit  Anesthesia Type:General  Level of Consciousness: awake, alert  and oriented  Airway & Oxygen Therapy: Patient Spontanous Breathing  Post-op Assessment: Report given to RN  Post vital signs: Reviewed and stable  Last Vitals:  Vitals Value Taken Time  BP    Temp    Pulse    Resp    SpO2      Last Pain:  Vitals:   04/12/20 1215  TempSrc: Oral  PainSc: 0-No pain         Complications: No apparent anesthesia complications

## 2020-04-12 NOTE — Progress Notes (Signed)
  Echocardiogram 2D Echocardiogram has been performed.  Richard Santos 04/12/2020, 8:35 AM

## 2020-04-12 NOTE — Progress Notes (Signed)
Please disregard my note 1605 PM today-- that was a different consult patient

## 2020-04-12 NOTE — Progress Notes (Signed)
SATURATION QUALIFICATIONS: (This note is used to comply with regulatory documentation for home oxygen)  Patient Saturations on Room Air at Rest = 94%   Patient Saturations on Room Air while Ambulating = 93%  Patient Saturations on 0 Liters of oxygen while Ambulating = 93%  Please briefly explain why patient needs home oxygen: patient does not qualify for home O2

## 2020-04-12 NOTE — Plan of Care (Signed)

## 2020-04-12 NOTE — Anesthesia Postprocedure Evaluation (Signed)
Anesthesia Post Note  Patient: Richard Santos  Procedure(s) Performed: VIDEO BRONCHOSCOPY WITHOUT FLUORO (N/A ) ENDOBRONCHIAL ULTRASOUND (N/A ) BRONCHIAL WASHINGS BRONCHIAL BRUSHINGS BRONCHIAL NEEDLE ASPIRATION BIOPSIES HEMOSTASIS CONTROL BRONCHIAL BIOPSIES     Patient location during evaluation: Endoscopy Anesthesia Type: General Level of consciousness: awake and alert Pain management: pain level controlled Vital Signs Assessment: post-procedure vital signs reviewed and stable Respiratory status: spontaneous breathing, nonlabored ventilation and respiratory function stable Cardiovascular status: blood pressure returned to baseline and stable Postop Assessment: no apparent nausea or vomiting Anesthetic complications: no    Last Vitals:  Vitals:   04/12/20 1439 04/12/20 1450  BP: 129/66 138/87  Pulse: 81 78  Resp: (!) 24 18  Temp:    SpO2: 94% 92%    Last Pain:  Vitals:   04/12/20 1429  TempSrc: Oral  PainSc: 0-No pain                 Krissia Schreier,W. EDMOND

## 2020-04-12 NOTE — Discharge Summary (Signed)
Physician Discharge Summary  Richard Santos ZJI:[id number] DOB: August 06, 1961 DOA: 04/11/2020  PCP: [name], MD  Admit date: 04/11/2020 Discharge date: 04/12/2020  Admitted From: Home  Disposition: Home   Recommendations for Outpatient Follow-up:  1. Follow up with PCP in 1-2 weeks 2. Please obtain BMP/CBC in one week 3. Follow up with oncology for further care of lung mass.     Discharge Condition: Stable.  CODE STATUS: Full code Diet recommendation: Heart Healthy   Brief/Interim Summary: 59 year old with past medical history significant for hypertension, hyperlipidemia, prostate cancer status post radioactive seed, tobacco abuse who presented after syncopal episode.  Evaluation in the ED, CT angiochest showed a left hilar mass which invades the mediastinum, left hilum, and encases the left upper lobe pulmonary artery concerning for primary bronchogenic carcinoma. Pulmonologist has been  consulted, underwent  bronchoscopy .  Plan to follow up with oncologist    1-Lung Mass;  chest showed a left hilar mass which invades the mediastinum, left hilum, and encases the left upper lobe pulmonary artery concerning for primary bronchogenic carcinoma. Underwent Bronchoscopy. Biopsy results pending.  Oncology consulted. Plan to follow up with Dr Julien Nordmann out patient. Office will call to arrange follow up/  MRI of the brain show 6 mm left cerebellar metastatic focus  2-Syncope;  Suspect vasovagal Oxygen on ambulation Echo: Normal ejection fraction, no diastolic dysfunction   3-Anxiety ;Continue with BuSpar 4-Hyperlipidemia: Continue with atorvastatin 5-Tobacco: Counseling  Alcohol abuse: He was monitor on  CIWA protocol. No evidence of withdrawal.   History of prostate cancer status post radioactive seed placement HTN; resume home medication at discharge.   Discharge Diagnoses:  Principal Problem:   Syncope Active Problems:   Hyperlipidemia   Anxiety state   Tobacco  abuse   Alcohol abuse   Lung mass   History of prostate cancer    Discharge Instructions  Discharge Instructions    Diet - low sodium heart healthy   Complete by: As directed    Increase activity slowly   Complete by: As directed      Allergies as of 04/12/2020      Reactions   Bee Venom Swelling      Medication List    STOP taking these medications   aspirin 81 MG tablet   cetirizine 10 MG tablet Commonly known as: ZYRTEC   ibuprofen 200 MG tablet Commonly known as: ADVIL   meloxicam 15 MG tablet Commonly known as: MOBIC   sildenafil 20 MG tablet Commonly known as: Revatio     TAKE these medications   acetaminophen 325 MG tablet Commonly known as: TYLENOL Take 2 tablets (650 mg total) by mouth every 6 (six) hours as needed for mild pain (or Fever >/= 101).   atorvastatin 40 MG tablet Commonly known as: LIPITOR Take 1 tablet (40 mg total) by mouth daily.   busPIRone 10 MG tablet Commonly known as: BUSPAR Take 1 tablet (10 mg total) by mouth 2 (two) times daily.   EPINEPHrine 0.3 mg/0.3 mL Soaj injection Commonly known as: EpiPen Inject 0.3 mLs (0.3 mg total) into the muscle once.   folic acid 1 MG tablet Commonly known as: FOLVITE Take 1 tablet (1 mg total) by mouth daily. Start taking on: Apr 13, 2020   losartan 50 MG tablet Commonly known as: COZAAR Take 1 tablet (50 mg total) by mouth daily.   multivitamin capsule Take 1 capsule by mouth daily.   nicotine 21 mg/24hr patch Commonly known as: NICODERM CQ -  dosed in mg/24 hours Place 1 patch (21 mg total) onto the skin daily.   pantoprazole 40 MG tablet Commonly known as: PROTONIX Take 1 tablet (40 mg total) by mouth daily before breakfast.   tamsulosin 0.4 MG Caps capsule Commonly known as: FLOMAX Take 1 capsule (0.4 mg total) by mouth daily.   traMADol 50 MG tablet Commonly known as: ULTRAM TAKE HALF TABLET BY MOUTH 2 TIMES DAILY AS NEEDED FOR PAIN      Follow-up Information     [name], MD Follow up in 1 week(s).   Specialties: Internal Medicine, Radiology Contact information: [place] [address] [phone]        Marshell Garfinkel, MD Follow up in 2 week(s).   Specialty: Pulmonary Disease Contact information: [place] [address] [phone]        Curt Bears, MD Follow up.   Specialty: Oncology Why: his office will call you with appointment,  Contact information: [address]  [place] [address] [phone]          Allergies  Allergen Reactions  . Bee Venom Swelling    Consultations: Pulmonologist Oncologist   Procedures/Studies: DG Chest 2 View  Result Date: 04/11/2020 CLINICAL DATA:  Syncopal episode for 2 minutes, intermittent LEFT chest pain over last week or 2 radiating to LEFT axilla, pale and diaphoretic, history hypertension, smoker, prostate cancer EXAM: CHEST - 2 VIEW COMPARISON:  12/21/2014, 04/03/2020 FINDINGS: Normal heart size and pulmonary vascularity. Abnormal enlargement of LEFT pulmonary hilum. Emphysematous and bronchitic changes consistent with COPD. Asymmetric apical scarring greater on LEFT. No acute infiltrate, pleural effusion or pneumothorax. Probable BILATERAL nipple shadows. Bones demineralized. IMPRESSION: COPD changes with enlargement of LEFT pulmonary hilum, cannot exclude perihilar mass or hilar adenopathy; CT chest with contrast recommended for further assessment. Remainder of exam unremarkable. Findings called to Dr. Johnney Killian on 04/11/2020 at 1038 hours. Electronically Signed   By: Lavonia Dana M.D.   On: 04/11/2020 10:37   DG Chest 2 View  Result Date: 04/04/2020 CLINICAL DATA:  Chest pain EXAM: CHEST - 2 VIEW COMPARISON:  December 21, 2014 FINDINGS: There is slight scarring in the apices. Lungs elsewhere are clear. Heart size and pulmonary vascularity are normal. No adenopathy. There is degenerative change in the midthoracic spine.  IMPRESSION: Slight apical scarring. No edema or airspace opacity. Cardiac silhouette within normal limits. No adenopathy. Electronically Signed   By: Lowella Grip III M.D.   On: 04/04/2020 08:53   CT Angio Chest PE W/Cm &/Or Wo Cm  Result Date: 04/11/2020 CLINICAL DATA:  Chest pain and SOB. Suspected lung mass. EXAM: CT ANGIOGRAPHY CHEST WITH CONTRAST TECHNIQUE: Multidetector CT imaging of the chest was performed using the standard protocol during bolus administration of intravenous contrast. Multiplanar CT image reconstructions and MIPs were obtained to evaluate the vascular anatomy. CONTRAST:  159mL OMNIPAQUE IOHEXOL 350 MG/ML SOLN COMPARISON:  Chest radiograph from 04/11/2020 FINDINGS: Cardiovascular: Satisfactory opacification of the pulmonary arteries to the segmental level. No evidence of pulmonary embolism. Normal heart size. No pericardial effusion. Aortic atherosclerosis. Lad, left circumflex and RCA coronary artery calcifications. Mediastinum/Nodes: Normal appearance of the thyroid gland. The trachea appears patent and is midline. Normal appearance of the esophagus. Left hilar/AP window mass measures 4.7 x 4.5 by 4.8 cm. This invades the mediastinum and left hilum. There is encasement of the left upper lobe pulmonary arteries with significant luminal narrowing. Encasement of the distal left mainstem bronchus and its branches  are also identified, image 81/9. Adjacent left pre-vascular lymph node is borderline enlarged measuring 1.3 cm, image 61/6. No subcarinal, right paratracheal or right hilar adenopathy. No supraclavicular or axillary adenopathy. Lungs/Pleura: No pleural effusion. Centrilobular and paraseptal emphysema identified. Biapical pleuroparenchymal scarring noted. Within the anteromedial left upper lobe there is a subpleural nodular density which is nonspecific measuring 1.5 x 0.9 cm, image 77/7. Patchy areas of ground-glass attenuation are noted within the upper lobes. Index  ground-glass density within the left upper lobe measures 2.0 x 1.1 cm, image 48/7. Upper Abdomen: No acute findings within the imaged portions of the upper abdomen. Musculoskeletal: Thoracolumbar scoliosis. No suspicious or acute bone lesions. Review of the MIP images confirms the above findings. IMPRESSION: 1. No evidence for acute pulmonary embolus. 2. Left hilar/AP window mass is identified which invades the mediastinum and left hilum. Highly concerning for primary bronchogenic carcinoma. Encasement of the left upper lobe pulmonary arteries and its branches is noted with significant luminal narrowing. Also noted is a borderline enlarged left pre-vascular lymph node, which likely represents a metastatic lymph node. Recommend referral to pulmonary medicine/thoracic surgery multi disciplinary thoracic oncology group for further workup and management. 3. There is a nonspecific subpleural nodular density in the anteromedial left upper lobe measuring 1.5 cm. This could be further addressed at PET-CT. 4. Patchy areas of ground-glass attenuation are noted within the upper lobes. These are nonspecific and without previous imaging for comparison may be inflammatory or infectious in etiology. 5. Emphysema and aortic atherosclerosis. Coronary artery calcifications. Aortic Atherosclerosis (ICD10-I70.0) and Emphysema (ICD10-J43.9). Electronically Signed   By: Kerby Moors M.D.   On: 04/11/2020 13:22   MR BRAIN W WO CONTRAST  Result Date: 04/11/2020 CLINICAL DATA:  59 year old male with recently diagnosed left lung mass. Staging. EXAM: MRI HEAD WITHOUT AND WITH CONTRAST TECHNIQUE: Multiplanar, multiecho pulse sequences of the brain and surrounding structures were obtained without and with intravenous contrast. CONTRAST:  7.53mL GADAVIST GADOBUTROL 1 MMOL/ML IV SOLN COMPARISON:  CTA chest earlier today. FINDINGS: Brain: 6 mm small enhancing mass in the inferior left cerebellum (series 10, image 6). No significant cerebellar  edema. No mass effect. This does have mildly Abner diffusion on the basis of hypercellularity. No other abnormal intracranial enhancement identified. Small right cingulate gyrus developmental venous anomaly (DVA series 11, image 15, normal variant). No dural thickening identified. No midline shift or intracranial mass effect. No restricted diffusion suggestive of acute infarction. No ventriculomegaly, extra-axial collection or acute intracranial hemorrhage. Cervicomedullary junction and pituitary are within normal limits. No cortical encephalomalacia or chronic cerebral blood products identified. Mild to moderate for age nonspecific white matter signal changes. Vascular: Major intracranial vascular flow voids are preserved. The major dural venous sinuses are enhancing and appear to be patent. Skull and upper cervical spine: Negative visible cervical spine. Normal bone marrow signal. Sinuses/Orbits: Negative orbits. Bilateral paranasal sinus mucosal thickening and opacification. Other: Trace right mastoid effusion. Negative nasopharynx. Visible internal auditory structures appear normal. Scalp and face soft tissues appear negative. IMPRESSION: 1. Solitary 6 mm enhancing mass in the inferior left cerebellum compatible with a solitary brain metastasis. No edema or mass effect. 2. No other metastatic disease or acute intracranial abnormality identified. Mild to moderate for age nonspecific white matter signal changes, most commonly due to chronic small vessel disease. 3. Bilateral paranasal sinus disease. Electronically Signed   By: Genevie Ann M.D.   On: 04/11/2020 19:23   DG CHEST PORT 1 VIEW  Result Date: 04/12/2020 CLINICAL DATA:  Lung mass, post bronchoscopy today EXAM: PORTABLE CHEST 1 VIEW COMPARISON:  Portable exam 1449 hours compared to 04/11/2020 FINDINGS: Normal heart size and pulmonary vascularity. Slight rotation to the RIGHT, which decreases the prominence of the previously identified RIGHT hilar mass.  Scattered LEFT perihilar infiltrate, likely sequela of bronchoscopy and biopsy. No pleural effusion or pneumothorax. IMPRESSION: Mild LEFT perihilar infiltrate likely related to preceding bronchoscopy and biopsy. No pleural effusion or pneumothorax identified. Previously identified LEFT hilar mass less well demonstrated on current portable exam rotated to the RIGHT. Electronically Signed   By: Lavonia Dana M.D.   On: 04/12/2020 15:15   ECHOCARDIOGRAM COMPLETE  Result Date: 04/12/2020    ECHOCARDIOGRAM REPORT   Patient Name:   Kaushik JAREL CUADRA Date of Exam: 04/12/2020 Medical Rec #:  [id number]      Height:       72.0 in Accession #:    [id number]     Weight:       160.5 lb Date of Birth:  1961-10-15      BSA:          1.940 m Patient Age:    59 years       BP:           119/78 mmHg Patient Gender: M              HR:           79 bpm. Exam Location:  Inpatient Procedure: 2D Echo Indications:    Syncope 780.2 / R55  History:        Patient has no prior history of Echocardiogram examinations.                 Signs/Symptoms:Syncope; Risk Factors:Tobacco abuse, Hypertension                 and Dyslipidemia. Alcohol abuse.  Sonographer:    Vikki Ports Turrentine Referring Phys: [id number] [place]  1. Left ventricular ejection fraction, by estimation, is 50 to 55%. The left ventricle has low normal function. The left ventricle has no regional wall motion abnormalities. Left ventricular diastolic parameters were normal.  2. Right ventricular systolic function is normal. The right ventricular size is normal. Tricuspid regurgitation signal is inadequate for assessing PA pressure.  3. The mitral valve is normal in structure. No evidence of mitral valve regurgitation. No evidence of mitral stenosis.  4. The aortic valve is tricuspid. Aortic valve regurgitation is not visualized. No aortic stenosis is present.  5. The inferior vena cava is normal in size with greater than 50% respiratory variability, suggesting right  atrial pressure of 3 mmHg. FINDINGS  Left Ventricle: Left ventricular ejection fraction, by estimation, is 50 to 55%. The left ventricle has low normal function. The left ventricle has no regional wall motion abnormalities. The left ventricular internal cavity size was normal in size. There is no left ventricular hypertrophy. Left ventricular diastolic parameters were normal. Right Ventricle: The right ventricular size is normal. No increase in right ventricular wall thickness. Right ventricular systolic function is normal. Tricuspid regurgitation signal is inadequate for assessing PA pressure. Left Atrium: Left atrial size was normal in size. Right Atrium: Right atrial size was normal in size. Pericardium: There is no evidence of pericardial effusion. Mitral Valve: The mitral valve is normal in structure. Normal mobility of the mitral valve leaflets. No evidence of mitral valve regurgitation. No evidence of mitral valve stenosis. Tricuspid Valve: The tricuspid valve is normal in structure. Tricuspid valve  regurgitation is not demonstrated. No evidence of tricuspid stenosis. Aortic Valve: The aortic valve is tricuspid. Aortic valve regurgitation is not visualized. No aortic stenosis is present. Pulmonic Valve: The pulmonic valve was normal in structure. Pulmonic valve regurgitation is trivial. No evidence of pulmonic stenosis. Aorta: The aortic root is normal in size and structure. Venous: The inferior vena cava is normal in size with greater than 50% respiratory variability, suggesting right atrial pressure of 3 mmHg. IAS/Shunts: No atrial level shunt detected by color flow Doppler.  LEFT VENTRICLE PLAX 2D LVIDd:         5.00 cm  Diastology LVIDs:         3.70 cm  LV e' lateral:   14.00 cm/s LV PW:         0.70 cm  LV E/e' lateral: 4.3 LV IVS:        0.70 cm  LV e' medial:    9.25 cm/s LVOT diam:     2.10 cm  LV E/e' medial:  6.5 LV SV:         69 LV SV Index:   36 LVOT Area:     3.46 cm  RIGHT VENTRICLE RV S  prime:     11.20 cm/s TAPSE (M-mode): 2.2 cm LEFT ATRIUM             Index LA diam:        3.60 cm 1.86 cm/m LA Vol (A2C):   64.4 ml 33.19 ml/m LA Vol (A4C):   43.9 ml 22.62 ml/m LA Biplane Vol: 53.3 ml 27.47 ml/m  AORTIC VALVE LVOT Vmax:   98.70 cm/s LVOT Vmean:  70.700 cm/s LVOT VTI:    0.199 m  AORTA Ao Root diam: 3.40 cm MITRAL VALVE MV Area (PHT): 3.65 cm    SHUNTS MV Decel Time: 208 msec    Systemic VTI:  0.20 m MV E velocity: 60.00 cm/s  Systemic Diam: 2.10 cm MV A velocity: 64.30 cm/s MV E/A ratio:  0.93 Cherlynn Kaiser MD Electronically signed by Cherlynn Kaiser MD Signature Date/Time: 04/12/2020/12:50:40 PM    Final      Subjective: Denies dyspnea.   Discharge Exam: Vitals:   04/12/20 1450 04/12/20 1511  BP: 138/87 127/75  Pulse: 78 76  Resp: 18 16  Temp:  98.2 F (36.8 C)  SpO2: 92% 93%     General: Pt is alert, awake, not in acute distress Cardiovascular: RRR, S1/S2 +, no rubs, no gallops Respiratory: CTA bilaterally, no wheezing, no rhonchi Abdominal: Soft, NT, ND, bowel sounds + Extremities: no edema, no cyanosis    The results of significant diagnostics from this hospitalization (including imaging, microbiology, ancillary and laboratory) are listed below for reference.     Microbiology: Recent Results (from the past 240 hour(s))  SARS Coronavirus 2 by RT PCR (hospital order, performed in [facility] hospital lab) Nasopharyngeal Nasopharyngeal Swab     Status: None   Collection Time: 04/11/20  4:12 PM   Specimen: Nasopharyngeal Swab  Result Value Ref Range Status   SARS Coronavirus 2 NEGATIVE NEGATIVE Final    Comment: (NOTE) SARS-CoV-2 target nucleic acids are NOT DETECTED. The SARS-CoV-2 RNA is generally detectable in upper and lower respiratory specimens during the acute phase of infection. The lowest concentration of SARS-CoV-2 viral copies this assay can detect is 250 copies / mL. A negative result does not preclude SARS-CoV-2 infection and should not  be used as the sole basis for treatment or other patient management decisions.  A  negative result may occur with improper specimen collection / handling, submission of specimen other than nasopharyngeal swab, presence of viral mutation(s) within the areas targeted by this assay, and inadequate number of viral copies (<250 copies / mL). A negative result must be combined with clinical observations, patient history, and epidemiological information. Fact Sheet for Patients:   StrictlyIdeas.no Fact Sheet for Healthcare Providers: BankingDealers.co.za This test is not yet approved or cleared  by the [place] FDA and has been authorized for detection and/or diagnosis of SARS-CoV-2 by FDA under an Emergency Use Authorization (EUA).  This EUA will remain in effect (meaning this test can be used) for the duration of the COVID-19 declaration under Section 564(b)(1) of the Act, 21 U.S.C. section 360bbb-3(b)(1), unless the authorization is terminated or revoked sooner. Performed at [facility], [place] [address] [place], [place] [address]      Labs: BNP (last 3 results) No results for input(s): BNP in the last 8760 hours. Basic Metabolic Panel: Recent Labs  Lab 04/11/20 1016 04/12/20 0345  NA 141 138  K 4.0 3.7  CL 107 104  CO2 24 27  GLUCOSE 111* 102*  BUN 8 10  CREATININE 0.63 0.71  CALCIUM 9.2 9.0  MG  --  1.7  PHOS  --  4.2   Liver Function Tests: Recent Labs  Lab 04/11/20 1016  AST 41  ALT 40  ALKPHOS 64  BILITOT 0.6  PROT 7.0  ALBUMIN 3.8   Recent Labs  Lab 04/11/20 1016  LIPASE 33   No results for input(s): AMMONIA in the last 168 hours. CBC: Recent Labs  Lab 04/11/20 1016 04/12/20 0345  WBC 7.6 7.1  NEUTROABS 5.6  --   HGB 13.0 12.5*  HCT 38.5* 37.4*  MCV 102.4* 101.6*  PLT 213 204   Cardiac Enzymes: No results for input(s): CKTOTAL, CKMB, CKMBINDEX, TROPONINI in the last 168  hours. BNP: Invalid input(s): POCBNP CBG: No results for input(s): GLUCAP in the last 168 hours. D-Dimer Recent Labs    04/11/20 1016  DDIMER 0.71*   Hgb A1c No results for input(s): HGBA1C in the last 72 hours. Lipid Profile No results for input(s): CHOL, HDL, LDLCALC, TRIG, CHOLHDL, LDLDIRECT in the last 72 hours. Thyroid function studies No results for input(s): TSH, T4TOTAL, T3FREE, THYROIDAB in the last 72 hours.  Invalid input(s): FREET3 Anemia work up No results for input(s): VITAMINB12, FOLATE, FERRITIN, TIBC, IRON, RETICCTPCT in the last 72 hours. Urinalysis    Component Value Date/Time   COLORURINE YELLOW 12/12/2019 1506   APPEARANCEUR CLEAR 12/12/2019 1506   LABSPEC >=1.030 (A) 12/12/2019 1506   PHURINE 5.5 12/12/2019 1506   GLUCOSEU NEGATIVE 12/12/2019 1506   HGBUR NEGATIVE 12/12/2019 [place] 12/12/2019 1506   KETONESUR NEGATIVE 12/12/2019 1506   UROBILINOGEN 0.2 12/12/2019 1506   NITRITE NEGATIVE 12/12/2019 1506   LEUKOCYTESUR NEGATIVE 12/12/2019 1506   Sepsis Labs Invalid input(s): PROCALCITONIN,  WBC,  LACTICIDVEN Microbiology Recent Results (from the past 240 hour(s))  SARS Coronavirus 2 by RT PCR (hospital order, performed in [place] hospital lab) Nasopharyngeal Nasopharyngeal Swab     Status: None   Collection Time: 04/11/20  4:12 PM   Specimen: Nasopharyngeal Swab  Result Value Ref Range Status   SARS Coronavirus 2 NEGATIVE NEGATIVE Final    Comment: (NOTE) SARS-CoV-2 target nucleic acids are NOT DETECTED. The SARS-CoV-2 RNA is generally detectable in upper and lower respiratory specimens during the acute phase of infection. The lowest concentration  of SARS-CoV-2 viral copies this assay can detect is 250 copies / mL. A negative result does not preclude SARS-CoV-2 infection and should not be used as the sole basis for treatment or other patient management decisions.  A negative result may occur with improper specimen  collection / handling, submission of specimen other than nasopharyngeal swab, presence of viral mutation(s) within the areas targeted by this assay, and inadequate number of viral copies (<250 copies / mL). A negative result must be combined with clinical observations, patient history, and epidemiological information. Fact Sheet for Patients:   StrictlyIdeas.no Fact Sheet for Healthcare Providers: BankingDealers.co.za This test is not yet approved or cleared  by the [place] FDA and has been authorized for detection and/or diagnosis of SARS-CoV-2 by FDA under an Emergency Use Authorization (EUA).  This EUA will remain in effect (meaning this test can be used) for the duration of the COVID-19 declaration under Section 564(b)(1) of the Act, 21 U.S.C. section 360bbb-3(b)(1), unless the authorization is terminated or revoked sooner. Performed at [facility], [place] [address], [place], [place] [address]      Time coordinating discharge: 40 minutes  SIGNED:   Elmarie Shiley, MD  [facility]

## 2020-04-12 NOTE — Progress Notes (Signed)
PROGRESS NOTE    Richard Santos  PYK:[id number] DOB: 1961-07-07 DOA: 04/11/2020 PCP: [name], MD   Brief Narrative: 59 year old with past medical history significant for hypertension, hyperlipidemia, prostate cancer status post radioactive seed, tobacco abuse who presented after syncopal episode.  Evaluation in the ED, CT angio chest showed a left hilar mass which invades the mediastinum, left hilum, and encases the left upper lobe pulmonary artery concerning for primary bronchogenic carcinoma. Pulmonologist has been  consulted, plan for bronchoscopy today   Assessment & Plan:   Principal Problem:   Syncope Active Problems:   Hyperlipidemia   Anxiety state   Tobacco abuse   Alcohol abuse   Mass of left lung   History of prostate cancer  1-Lung Mass;  chest showed a left hilar mass which invades the mediastinum, left hilum, and encases the left upper lobe pulmonary artery concerning for primary bronchogenic carcinoma. Bronchoscopy today Oncology consulted MRI of the brain show 6 mm left cerebellar metastatic focus  2-Syncope;  Suspect vasovagal Oxygen on ambulation Echo: Normal ejection fraction, no diastolic dysfunction   3-Anxiety ;Continue with BuSpar 4-Hyperlipidemia: Continue with atorvastatin 5-Tobacco: Counseling  Alcohol abuse: Continue with CIWA protocol History of prostate cancer status post radioactive seed placement   Estimated body mass index is 21.77 kg/m as calculated from the following:   Height as of this encounter: 6' (1.829 m).   Weight as of this encounter: 72.8 kg.   DVT prophylaxis: SCDs Code Status: Full code Family Communication: Care discussed with patient Disposition Plan:  Status is: Inpatient Male in the hospital for further evaluation of the lung mass, plan for bronchoscopy today   Dispo: The patient is from: Home              Anticipated d/c is to: Home              Anticipated d/c date is: 1-2 days              Patient  currently no medical stable         Consultants:   Pulmonology   Oncology   Procedures:   Bronchoscpy  Antimicrobials:    Subjective: He denies dyspnea , denies chest pain   Objective: Vitals:   04/11/20 2223 04/11/20 2234 04/11/20 2353 04/12/20 0436  BP: 127/83  113/72 119/78  Pulse: 75 71 76 79  Resp: 14 17 16 18   Temp: 97.7 F (36.5 C) 97.7 F (36.5 C) 98.4 F (36.9 C) 97.9 F (36.6 C)  TempSrc: Oral Oral Oral Oral  SpO2: 99% 99% 97% 98%  Weight:    72.8 kg  Height:        Intake/Output Summary (Last 24 hours) at 04/12/2020 0827 Last data filed at 04/11/2020 2227 Gross per 24 hour  Intake 477 ml  Output --  Net 477 ml   Filed Weights   04/11/20 0943 04/12/20 0436  Weight: 74.4 kg 72.8 kg    Examination:  General exam: Appears calm and comfortable  Respiratory system: Clear to auscultation. Respiratory effort normal. Cardiovascular system: S1 & S2 heard, RRR. No JVD, murmurs, rubs, gallops or clicks. No pedal edema. Gastrointestinal system: Abdomen is nondistended, soft and nontender. No organomegaly or masses felt. Normal bowel sounds heard. Central nervous system: Alert and oriented. No focal neurological deficits. Extremities: Symmetric 5 x 5 power. Skin: No rashes, lesions or ulcers   Data Reviewed: I have personally reviewed following labs and imaging studies  CBC: Recent Labs  Lab 04/11/20  1016 04/12/20 0345  WBC 7.6 7.1  NEUTROABS 5.6  --   HGB 13.0 12.5*  HCT 38.5* 37.4*  MCV 102.4* 101.6*  PLT 213 235   Basic Metabolic Panel: Recent Labs  Lab 04/11/20 1016 04/12/20 0345  NA 141 138  K 4.0 3.7  CL 107 104  CO2 24 27  GLUCOSE 111* 102*  BUN 8 10  CREATININE 0.63 0.71  CALCIUM 9.2 9.0  MG  --  1.7  PHOS  --  4.2   GFR: Estimated Creatinine Clearance: 103.6 mL/min (by C-G formula based on SCr of 0.71 mg/dL). Liver Function Tests: Recent Labs  Lab 04/11/20 1016  AST 41  ALT 40  ALKPHOS 64  BILITOT 0.6  PROT  7.0  ALBUMIN 3.8   Recent Labs  Lab 04/11/20 1016  LIPASE 33   No results for input(s): AMMONIA in the last 168 hours. Coagulation Profile: Recent Labs  Lab 04/11/20 1016  INR 1.0   Cardiac Enzymes: No results for input(s): CKTOTAL, CKMB, CKMBINDEX, TROPONINI in the last 168 hours. BNP (last 3 results) No results for input(s): PROBNP in the last 8760 hours. HbA1C: No results for input(s): HGBA1C in the last 72 hours. CBG: No results for input(s): GLUCAP in the last 168 hours. Lipid Profile: No results for input(s): CHOL, HDL, LDLCALC, TRIG, CHOLHDL, LDLDIRECT in the last 72 hours. Thyroid Function Tests: No results for input(s): TSH, T4TOTAL, FREET4, T3FREE, THYROIDAB in the last 72 hours. Anemia Panel: No results for input(s): VITAMINB12, FOLATE, FERRITIN, TIBC, IRON, RETICCTPCT in the last 72 hours. Sepsis Labs: No results for input(s): PROCALCITON, LATICACIDVEN in the last 168 hours.  Recent Results (from the past 240 hour(s))  SARS Coronavirus 2 by RT PCR (hospital order, performed in [facility] hospital lab) Nasopharyngeal Nasopharyngeal Swab     Status: None   Collection Time: 04/11/20  4:12 PM   Specimen: Nasopharyngeal Swab  Result Value Ref Range Status   SARS Coronavirus 2 NEGATIVE NEGATIVE Final    Comment: (NOTE) SARS-CoV-2 target nucleic acids are NOT DETECTED. The SARS-CoV-2 RNA is generally detectable in upper and lower respiratory specimens during the acute phase of infection. The lowest concentration of SARS-CoV-2 viral copies this assay can detect is 250 copies / mL. A negative result does not preclude SARS-CoV-2 infection and should not be used as the sole basis for treatment or other patient management decisions.  A negative result may occur with improper specimen collection / handling, submission of specimen other than nasopharyngeal swab, presence of viral mutation(s) within the areas targeted by this assay, and inadequate number of viral  copies (<250 copies / mL). A negative result must be combined with clinical observations, patient history, and epidemiological information. Fact Sheet for Patients:   StrictlyIdeas.no Fact Sheet for Healthcare Providers: BankingDealers.co.za This test is not yet approved or cleared  by the [place] FDA and has been authorized for detection and/or diagnosis of SARS-CoV-2 by FDA under an Emergency Use Authorization (EUA).  This EUA will remain in effect (meaning this test can be used) for the duration of the COVID-19 declaration under Section 564(b)(1) of the Act, 21 U.S.C. section 360bbb-3(b)(1), unless the authorization is terminated or revoked sooner. Performed at [facility], [place] [address], [place], [place] [address]          Radiology Studies: DG Chest 2 View  Result Date: 04/11/2020 CLINICAL DATA:  Syncopal episode for 2 minutes, intermittent LEFT chest pain over last week or 2 radiating to  LEFT axilla, pale and diaphoretic, history hypertension, smoker, prostate cancer EXAM: CHEST - 2 VIEW COMPARISON:  12/21/2014, 04/03/2020 FINDINGS: Normal heart size and pulmonary vascularity. Abnormal enlargement of LEFT pulmonary hilum. Emphysematous and bronchitic changes consistent with COPD. Asymmetric apical scarring greater on LEFT. No acute infiltrate, pleural effusion or pneumothorax. Probable BILATERAL nipple shadows. Bones demineralized. IMPRESSION: COPD changes with enlargement of LEFT pulmonary hilum, cannot exclude perihilar mass or hilar adenopathy; CT chest with contrast recommended for further assessment. Remainder of exam unremarkable. Findings called to Dr. Johnney Killian on 04/11/2020 at 1038 hours. Electronically Signed   By: Lavonia Dana M.D.   On: 04/11/2020 10:37   CT Angio Chest PE W/Cm &/Or Wo Cm  Result Date: 04/11/2020 CLINICAL DATA:  Chest pain and SOB. Suspected lung mass. EXAM: CT ANGIOGRAPHY CHEST WITH CONTRAST  TECHNIQUE: Multidetector CT imaging of the chest was performed using the standard protocol during bolus administration of intravenous contrast. Multiplanar CT image reconstructions and MIPs were obtained to evaluate the vascular anatomy. CONTRAST:  138mL OMNIPAQUE IOHEXOL 350 MG/ML SOLN COMPARISON:  Chest radiograph from 04/11/2020 FINDINGS: Cardiovascular: Satisfactory opacification of the pulmonary arteries to the segmental level. No evidence of pulmonary embolism. Normal heart size. No pericardial effusion. Aortic atherosclerosis. Lad, left circumflex and RCA coronary artery calcifications. Mediastinum/Nodes: Normal appearance of the thyroid gland. The trachea appears patent and is midline. Normal appearance of the esophagus. Left hilar/AP window mass measures 4.7 x 4.5 by 4.8 cm. This invades the mediastinum and left hilum. There is encasement of the left upper lobe pulmonary arteries with significant luminal narrowing. Encasement of the distal left mainstem bronchus and its branches are also identified, image 81/9. Adjacent left pre-vascular lymph node is borderline enlarged measuring 1.3 cm, image 61/6. No subcarinal, right paratracheal or right hilar adenopathy. No supraclavicular or axillary adenopathy. Lungs/Pleura: No pleural effusion. Centrilobular and paraseptal emphysema identified. Biapical pleuroparenchymal scarring noted. Within the anteromedial left upper lobe there is a subpleural nodular density which is nonspecific measuring 1.5 x 0.9 cm, image 77/7. Patchy areas of ground-glass attenuation are noted within the upper lobes. Index ground-glass density within the left upper lobe measures 2.0 x 1.1 cm, image 48/7. Upper Abdomen: No acute findings within the imaged portions of the upper abdomen. Musculoskeletal: Thoracolumbar scoliosis. No suspicious or acute bone lesions. Review of the MIP images confirms the above findings. IMPRESSION: 1. No evidence for acute pulmonary embolus. 2. Left hilar/AP  window mass is identified which invades the mediastinum and left hilum. Highly concerning for primary bronchogenic carcinoma. Encasement of the left upper lobe pulmonary arteries and its branches is noted with significant luminal narrowing. Also noted is a borderline enlarged left pre-vascular lymph node, which likely represents a metastatic lymph node. Recommend referral to pulmonary medicine/thoracic surgery multi disciplinary thoracic oncology group for further workup and management. 3. There is a nonspecific subpleural nodular density in the anteromedial left upper lobe measuring 1.5 cm. This could be further addressed at PET-CT. 4. Patchy areas of ground-glass attenuation are noted within the upper lobes. These are nonspecific and without previous imaging for comparison may be inflammatory or infectious in etiology. 5. Emphysema and aortic atherosclerosis. Coronary artery calcifications. Aortic Atherosclerosis (ICD10-I70.0) and Emphysema (ICD10-J43.9). Electronically Signed   By: Kerby Moors M.D.   On: 04/11/2020 13:22   MR BRAIN W WO CONTRAST  Result Date: 04/11/2020 CLINICAL DATA:  59 year old male with recently diagnosed left lung mass. Staging. EXAM: MRI HEAD WITHOUT AND WITH CONTRAST TECHNIQUE: Multiplanar, multiecho pulse sequences of the  brain and surrounding structures were obtained without and with intravenous contrast. CONTRAST:  7.97mL GADAVIST GADOBUTROL 1 MMOL/ML IV SOLN COMPARISON:  CTA chest earlier today. FINDINGS: Brain: 6 mm small enhancing mass in the inferior left cerebellum (series 10, image 6). No significant cerebellar edema. No mass effect. This does have mildly Abner diffusion on the basis of hypercellularity. No other abnormal intracranial enhancement identified. Small right cingulate gyrus developmental venous anomaly (DVA series 11, image 15, normal variant). No dural thickening identified. No midline shift or intracranial mass effect. No restricted diffusion suggestive of  acute infarction. No ventriculomegaly, extra-axial collection or acute intracranial hemorrhage. Cervicomedullary junction and pituitary are within normal limits. No cortical encephalomalacia or chronic cerebral blood products identified. Mild to moderate for age nonspecific white matter signal changes. Vascular: Major intracranial vascular flow voids are preserved. The major dural venous sinuses are enhancing and appear to be patent. Skull and upper cervical spine: Negative visible cervical spine. Normal bone marrow signal. Sinuses/Orbits: Negative orbits. Bilateral paranasal sinus mucosal thickening and opacification. Other: Trace right mastoid effusion. Negative nasopharynx. Visible internal auditory structures appear normal. Scalp and face soft tissues appear negative. IMPRESSION: 1. Solitary 6 mm enhancing mass in the inferior left cerebellum compatible with a solitary brain metastasis. No edema or mass effect. 2. No other metastatic disease or acute intracranial abnormality identified. Mild to moderate for age nonspecific white matter signal changes, most commonly due to chronic small vessel disease. 3. Bilateral paranasal sinus disease. Electronically Signed   By: Genevie Ann M.D.   On: 04/11/2020 19:23        Scheduled Meds: . atorvastatin  40 mg Oral Daily  . busPIRone  10 mg Oral BID  . enoxaparin (LOVENOX) injection  40 mg Subcutaneous Q24H  . folic acid  1 mg Oral Daily  . LORazepam  0-4 mg Intravenous Q6H   Followed by  . [START ON 04/13/2020] LORazepam  0-4 mg Intravenous Q12H  . multivitamin with minerals  1 tablet Oral Daily  . nicotine  21 mg Transdermal Daily  . sodium chloride flush  3 mL Intravenous Q12H  . thiamine  100 mg Oral Daily   Or  . thiamine  100 mg Intravenous Daily   Continuous Infusions:   LOS: 1 day    Time spent: 35 minutes.     Elmarie Shiley, MD [facility]   If 7PM-7AM, please contact night-coverage [email or web address]  04/12/2020, 8:27 AM

## 2020-04-12 NOTE — Progress Notes (Signed)
ADDENDUM: Oriented Richard Santos briefly to his situation; he understands his case will be presented at brain tumor conference on Monday 05/24 and that we are in process of scheduling an outpatient appt for him with our lung cancer specialist DR Julien Nordmann.  From our point of view he may be discharged at your discretion.

## 2020-04-12 NOTE — Progress Notes (Signed)
Brief oncology note:  On-call physician was notified of request for consultation for this patient.  Briefly, this patient was admitted following a syncopal episode.  His past medical history significant for hypertension, hyperlipidemia, anxiety, prostate cancer status post radioactive seed implant under the care of Dr. Arloa Koh in 2016, and tobacco abuse.  In the ER, he had a CT angiogram of chest which showed a left hilar mass which invades the mediastinum, left hilum, and encases the left upper lobe pulmonary artery concerning for primary bronchogenic carcinoma.  The patient was seen by pulmonology and is having a bronchoscopy for biopsy today.  Additionally, the patient had an MRI of the brain with and without contrast which showed a solitary 6 mm enhancing mass in the inferior left cerebellum compatible with a solitary brain metastasis-there was no edema or mass-effect and no other metastatic disease or acute intracranial abnormality.  Case has been discussed with neuro oncology.  Their recommendations are to await pathology and if consistent with non-small cell lung cancer, they will arrange for an MRI of the brain with SRS protocol and they will also discuss his case this coming Monday at tumor board.  They feel this lesion is too small for surgery and no antibiotics would be indicated unless he develops symptoms.  We are also in the process of arranging outpatient follow-up with medical oncology-Dr. Julien Nordmann in our office following discharge.  The patient will likely need a PET scan once malignancy has been confirmed.  The patient will be contacted with the date and time of his appointment.  Mikey Bussing, DNP, AGPCNP-BC, AOCNP Mon/Tues/Thurs/Fri 7am-5pm; Off Wednesdays Cell: [phone]

## 2020-04-12 NOTE — Progress Notes (Signed)
   NAME:  Richard Santos, MRN:  [id number], DOB:  01-Jan-1961, LOS: 1 ADMISSION DATE:  04/11/2020, CONSULTATION DATE:  04/11/2020  REFERRING MD:  Dr.Pfeiffer, CHIEF COMPLAINT: Lung mass  Brief History   59 year old male initially presented with syncopal episode.  Preceding syncopal episode patient reported dizziness, chest tightness, and diaphoresis.  Work-up on arrival with CTA chest revealed left apical lung mass, PCCM consulted for further management and possible biopsy   Past Medical History  Prostate cancer Hyperlipidemia Hypertension GERD Degenerative disc disease Anxiety  Patient reports a current smoking history of 1PPD for approximately 30 years. 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 Additional history includes daily alcohol consumption of 4-6 beers a day with occasional shots of liquor as well. He denies any prior alcohol withdrawal or seizures.   Significant Hospital Events   Admitted 5/20  Consults:  PCCM  Procedures:  Pending bronchoscopy 5/21  Significant Diagnostic Tests:  CTA chest 5/20 > Negative for acute PE but 4.7 x 4.5 x 4.8cm left hilar mass was seen that invades the mediastinum and left hilum this mass also involves the left upper lobe pulmonary arteries with significant luminal narrowing seen.   Micro Data:  Covid >   Antimicrobials:     Interim history/subjective:   Patient seen and examined in preop area. No new complaints.  States that breathing is doing well.  Objective   Blood pressure (!) 143/46, pulse 70, temperature 98.9 F (37.2 C), temperature source Oral, resp. rate 15, height 6' (1.829 m), weight 72.8 kg, SpO2 97 %.        Intake/Output Summary (Last 24 hours) at 04/12/2020 1254 Last data filed at 04/11/2020 2227 Gross per 24 hour  Intake 477 ml  Output --  Net 477 ml   Filed Weights   04/11/20 0943 04/12/20 0436 04/12/20 1215  Weight: 74.4 kg 72.8 kg 72.8  kg    Examination: Gen:      No acute distress HEENT:  EOMI, sclera anicteric Neck:     No masses; no thyromegaly Lungs:    Clear to auscultation bilaterally; normal respiratory effort CV:         Regular rate and rhythm; no murmurs Abd:      + bowel sounds; soft, non-tender; no palpable masses, no distension Ext:    No edema; adequate peripheral perfusion Skin:      Warm and dry; no rash Neuro: alert and oriented x 3 Psych: normal mood and affect  Resolved Hospital Problem list     Assessment & Plan:  Left apical lung mass -CTA chest obtained on admission to rule out PE given syncopal episode which was negative for PE but did reveal 4.7 x 4.5 x 4.8cm left hilar mass that invades the mediastinum and left hilum this mass also involves the left upper lobe pulmonary arteries with significant luminal narrowing seen.  -Patient has an extensive tobacco abuse history of 1 pack/day for approximately 30 years P: Risk benefits discussed with patient. Proceed with bronchoscopy today with endobronchial ultrasound-guided biopsy.  Signature    Marshell Garfinkel MD [facility] Please see [email or web address] for pager details.  04/12/2020, 12:54 PM

## 2020-04-12 NOTE — Progress Notes (Signed)
ADDENDUM: Met w Richard Santos in his room, no family present. Emphasized that he has cancer of the lung and that it appears to have spread to his brain; that there is treatment, that the treatment can prolong his life and that it is often well tolerated.  He tells me he is very claustrophobic and doesn't think he can undergo MRI.  I let him know where the cancer center is and that he will have an appt with the lung cancer specialist after discharge.  Please let us know if we can be of further help.

## 2020-04-12 NOTE — Anesthesia Procedure Notes (Signed)
Procedure Name: Intubation Date/Time: 04/12/2020 1:01 PM Performed by: Barrington Ellison, CRNA Pre-anesthesia Checklist: Patient identified, Emergency Drugs available, Suction available and Patient being monitored Patient Re-evaluated:Patient Re-evaluated prior to induction Oxygen Delivery Method: Circle System Utilized Preoxygenation: Pre-oxygenation with 100% oxygen Induction Type: IV induction Ventilation: Mask ventilation without difficulty Laryngoscope Size: Mac and 4 Grade View: Grade I Tube type: Oral Tube size: 8.5 mm Number of attempts: 1 Airway Equipment and Method: Stylet and Oral airway Placement Confirmation: ETT inserted through vocal cords under direct vision,  positive ETCO2 and breath sounds checked- equal and bilateral Secured at: 23 cm Tube secured with: Tape Dental Injury: Teeth and Oropharynx as per pre-operative assessment

## 2020-04-12 NOTE — Anesthesia Preprocedure Evaluation (Signed)
Anesthesia Evaluation  Patient identified by MRN, date of birth, ID band Patient awake    Reviewed: Allergy & Precautions, NPO status , Patient's Chart, lab work & pertinent test results  Airway Mallampati: II  TM Distance: >3 FB Neck ROM: Full    Dental  (+) Dental Advisory Given   Pulmonary Current Smoker,  Lung mass   breath sounds clear to auscultation       Cardiovascular hypertension, Pt. on medications + dysrhythmias  Rhythm:Regular Rate:Normal     Neuro/Psych negative neurological ROS     GI/Hepatic GERD  ,  Endo/Other    Renal/GU      Musculoskeletal  (+) Arthritis ,   Abdominal   Peds  Hematology  (+) anemia ,   Anesthesia Other Findings   Reproductive/Obstetrics                             Lab Results  Component Value Date   WBC 7.1 04/12/2020   HGB 12.5 (L) 04/12/2020   HCT 37.4 (L) 04/12/2020   MCV 101.6 (H) 04/12/2020   PLT 204 04/12/2020   Lab Results  Component Value Date   CREATININE 0.71 04/12/2020   BUN 10 04/12/2020   NA 138 04/12/2020   K 3.7 04/12/2020   CL 104 04/12/2020   CO2 27 04/12/2020    Anesthesia Physical Anesthesia Plan  ASA: III  Anesthesia Plan: General   Post-op Pain Management:    Induction: Intravenous  PONV Risk Score and Plan: 1 and Dexamethasone, Ondansetron and Treatment may vary due to age or medical condition  Airway Management Planned: Oral ETT  Additional Equipment: None  Intra-op Plan:   Post-operative Plan: Extubation in OR  Informed Consent: I have reviewed the patients History and Physical, chart, labs and discussed the procedure including the risks, benefits and alternatives for the proposed anesthesia with the patient or authorized representative who has indicated his/her understanding and acceptance.     Dental advisory given  Plan Discussed with: CRNA  Anesthesia Plan Comments:          Anesthesia Quick Evaluation

## 2020-04-14 LAB — ACID FAST SMEAR (AFB, MYCOBACTERIA): Acid Fast Smear: NEGATIVE

## 2020-04-14 LAB — CULTURE, BAL-QUANTITATIVE W GRAM STAIN: Culture: NORMAL

## 2020-04-15 ENCOUNTER — Inpatient Hospital Stay: Payer: 59 | Attending: Internal Medicine

## 2020-04-15 ENCOUNTER — Telehealth: Payer: Self-pay | Admitting: Medical Oncology

## 2020-04-15 ENCOUNTER — Encounter: Payer: Self-pay | Admitting: *Deleted

## 2020-04-15 ENCOUNTER — Telehealth: Payer: Self-pay | Admitting: Internal Medicine

## 2020-04-15 ENCOUNTER — Telehealth: Payer: Self-pay | Admitting: Pulmonary Disease

## 2020-04-15 DIAGNOSIS — J449 Chronic obstructive pulmonary disease, unspecified: Secondary | ICD-10-CM | POA: Insufficient documentation

## 2020-04-15 DIAGNOSIS — C7931 Secondary malignant neoplasm of brain: Secondary | ICD-10-CM | POA: Insufficient documentation

## 2020-04-15 DIAGNOSIS — F1010 Alcohol abuse, uncomplicated: Secondary | ICD-10-CM | POA: Insufficient documentation

## 2020-04-15 DIAGNOSIS — C3412 Malignant neoplasm of upper lobe, left bronchus or lung: Secondary | ICD-10-CM | POA: Insufficient documentation

## 2020-04-15 DIAGNOSIS — Z79899 Other long term (current) drug therapy: Secondary | ICD-10-CM | POA: Insufficient documentation

## 2020-04-15 DIAGNOSIS — F419 Anxiety disorder, unspecified: Secondary | ICD-10-CM | POA: Insufficient documentation

## 2020-04-15 DIAGNOSIS — Z8042 Family history of malignant neoplasm of prostate: Secondary | ICD-10-CM | POA: Insufficient documentation

## 2020-04-15 DIAGNOSIS — E785 Hyperlipidemia, unspecified: Secondary | ICD-10-CM | POA: Insufficient documentation

## 2020-04-15 DIAGNOSIS — I10 Essential (primary) hypertension: Secondary | ICD-10-CM | POA: Insufficient documentation

## 2020-04-15 DIAGNOSIS — Z8249 Family history of ischemic heart disease and other diseases of the circulatory system: Secondary | ICD-10-CM | POA: Insufficient documentation

## 2020-04-15 DIAGNOSIS — K219 Gastro-esophageal reflux disease without esophagitis: Secondary | ICD-10-CM | POA: Insufficient documentation

## 2020-04-15 DIAGNOSIS — Z803 Family history of malignant neoplasm of breast: Secondary | ICD-10-CM | POA: Insufficient documentation

## 2020-04-15 DIAGNOSIS — Z8546 Personal history of malignant neoplasm of prostate: Secondary | ICD-10-CM | POA: Insufficient documentation

## 2020-04-15 DIAGNOSIS — Z87891 Personal history of nicotine dependence: Secondary | ICD-10-CM | POA: Insufficient documentation

## 2020-04-15 LAB — CYTOLOGY - NON PAP

## 2020-04-15 LAB — SURGICAL PATHOLOGY

## 2020-04-15 NOTE — Telephone Encounter (Signed)
A new pt appt has been scheduled for Richard Santos to see Dr. Julien Nordmann on 6/1 at 2:15pm w/labs at 1:45pm. Pt aware to arrive 15 minutes early.

## 2020-04-15 NOTE — Progress Notes (Signed)
I received referral on Richard Santos today.  I updated new patient scheduler to call and schedule him to be seen next week with labs.

## 2020-04-15 NOTE — Telephone Encounter (Signed)
Pt called asking what is his post bronchoscopy aftercare.  Very anxious:  "What can I do , what should I avoid? Can I drive?" Reviewed aftercare instructions:  Driving  Do not drive for 24 hours if you were given a medicine to help you relax (sedative).  Do not drive or use heavy machinery while taking prescription pain medicine. Get help right away if: 1. You have shortness of breath that gets worse. 2. You get light-headed. 3. You feel like you are going to pass out (faint). 4. You have chest pain. 5. You cough up: ? More than a little blood. ? More blood than before. Summary  Do not eat or drink anything (not even water) for 2 hours after your test, or until your numbing medicine wears off.  Do not use cigarettes. Do not use e-cigarettes.  Get help right away if you have chest pain.  He is only taking tylenol so I told him he should be okay to drive now.   I told him to contact Dr Vaughan Browner for any concerns or above symptoms  And told him that  Dr Concepcion Living will see him in 1-2 weeks. Phone number given to pt .

## 2020-04-15 NOTE — Telephone Encounter (Signed)
Mannam, Praveen, MD  P [facility] Triage Pool  Pt was seen in hospital. Please arrange follow up in clinic 1-2 weeks with me or APP. Thanks  -------------------------------------------------- ATC pt, there was no answer and I could not leave a message.

## 2020-04-16 ENCOUNTER — Telehealth: Payer: Self-pay | Admitting: *Deleted

## 2020-04-16 NOTE — Telephone Encounter (Signed)
I update Dr. Julien Nordmann on recent pathology. He would like to see patient sooner than 6/1.  I called and spoke with patient.  He verbalized understanding of appt change.

## 2020-04-17 ENCOUNTER — Other Ambulatory Visit: Payer: Self-pay | Admitting: Medical Oncology

## 2020-04-17 DIAGNOSIS — R918 Other nonspecific abnormal finding of lung field: Secondary | ICD-10-CM

## 2020-04-18 ENCOUNTER — Encounter: Payer: Self-pay | Admitting: Internal Medicine

## 2020-04-18 ENCOUNTER — Inpatient Hospital Stay (HOSPITAL_BASED_OUTPATIENT_CLINIC_OR_DEPARTMENT_OTHER): Payer: 59 | Admitting: Internal Medicine

## 2020-04-18 ENCOUNTER — Other Ambulatory Visit: Payer: Self-pay

## 2020-04-18 ENCOUNTER — Inpatient Hospital Stay: Payer: 59

## 2020-04-18 VITALS — BP 104/69 | HR 90 | Temp 97.7°F | Resp 18 | Ht 72.0 in | Wt 159.4 lb

## 2020-04-18 DIAGNOSIS — F419 Anxiety disorder, unspecified: Secondary | ICD-10-CM | POA: Diagnosis not present

## 2020-04-18 DIAGNOSIS — Z8249 Family history of ischemic heart disease and other diseases of the circulatory system: Secondary | ICD-10-CM | POA: Diagnosis not present

## 2020-04-18 DIAGNOSIS — Z87891 Personal history of nicotine dependence: Secondary | ICD-10-CM | POA: Diagnosis not present

## 2020-04-18 DIAGNOSIS — Z803 Family history of malignant neoplasm of breast: Secondary | ICD-10-CM

## 2020-04-18 DIAGNOSIS — C3412 Malignant neoplasm of upper lobe, left bronchus or lung: Secondary | ICD-10-CM

## 2020-04-18 DIAGNOSIS — C7931 Secondary malignant neoplasm of brain: Secondary | ICD-10-CM

## 2020-04-18 DIAGNOSIS — I10 Essential (primary) hypertension: Secondary | ICD-10-CM

## 2020-04-18 DIAGNOSIS — J449 Chronic obstructive pulmonary disease, unspecified: Secondary | ICD-10-CM

## 2020-04-18 DIAGNOSIS — K219 Gastro-esophageal reflux disease without esophagitis: Secondary | ICD-10-CM | POA: Diagnosis not present

## 2020-04-18 DIAGNOSIS — F1010 Alcohol abuse, uncomplicated: Secondary | ICD-10-CM | POA: Diagnosis not present

## 2020-04-18 DIAGNOSIS — Z7189 Other specified counseling: Secondary | ICD-10-CM

## 2020-04-18 DIAGNOSIS — Z79899 Other long term (current) drug therapy: Secondary | ICD-10-CM | POA: Diagnosis not present

## 2020-04-18 DIAGNOSIS — R918 Other nonspecific abnormal finding of lung field: Secondary | ICD-10-CM

## 2020-04-18 DIAGNOSIS — Z8042 Family history of malignant neoplasm of prostate: Secondary | ICD-10-CM | POA: Diagnosis not present

## 2020-04-18 DIAGNOSIS — E785 Hyperlipidemia, unspecified: Secondary | ICD-10-CM | POA: Diagnosis not present

## 2020-04-18 DIAGNOSIS — Z5111 Encounter for antineoplastic chemotherapy: Secondary | ICD-10-CM | POA: Insufficient documentation

## 2020-04-18 DIAGNOSIS — Z8546 Personal history of malignant neoplasm of prostate: Secondary | ICD-10-CM

## 2020-04-18 DIAGNOSIS — Z5112 Encounter for antineoplastic immunotherapy: Secondary | ICD-10-CM | POA: Insufficient documentation

## 2020-04-18 LAB — CMP (CANCER CENTER ONLY)
ALT: 44 U/L (ref 0–44)
AST: 33 U/L (ref 15–41)
Albumin: 4.4 g/dL (ref 3.5–5.0)
Alkaline Phosphatase: 59 U/L (ref 38–126)
Anion gap: 11 (ref 5–15)
BUN: 13 mg/dL (ref 6–20)
CO2: 26 mmol/L (ref 22–32)
Calcium: 9.5 mg/dL (ref 8.9–10.3)
Chloride: 102 mmol/L (ref 98–111)
Creatinine: 0.92 mg/dL (ref 0.61–1.24)
GFR, Est AFR Am: >60 mL/min (ref >60)
GFR, Estimated: >60 mL/min (ref >60)
Glucose, Bld: 113 mg/dL — ABNORMAL HIGH (ref 70–99)
Potassium: 4.2 mmol/L (ref 3.5–5.1)
Sodium: 139 mmol/L (ref 135–145)
Total Bilirubin: 0.3 mg/dL (ref 0.3–1.2)
Total Protein: 8 g/dL (ref 6.5–8.1)

## 2020-04-18 LAB — CBC WITH DIFFERENTIAL (CANCER CENTER ONLY)
Abs Immature Granulocytes: 0.05 10*3/uL (ref 0.00–0.07)
Basophils Absolute: 0.1 10*3/uL (ref 0.0–0.1)
Basophils Relative: 1 %
Eosinophils Absolute: 0.5 10*3/uL (ref 0.0–0.5)
Eosinophils Relative: 5 %
HCT: 41.1 % (ref 39.0–52.0)
Hemoglobin: 14.2 g/dL (ref 13.0–17.0)
Immature Granulocytes: 1 %
Lymphocytes Relative: 26 %
Lymphs Abs: 2.3 10*3/uL (ref 0.7–4.0)
MCH: 35 pg — ABNORMAL HIGH (ref 26.0–34.0)
MCHC: 34.5 g/dL (ref 30.0–36.0)
MCV: 101.2 fL — ABNORMAL HIGH (ref 80.0–100.0)
Monocytes Absolute: 0.8 10*3/uL (ref 0.1–1.0)
Monocytes Relative: 9 %
Neutro Abs: 5.2 10*3/uL (ref 1.7–7.7)
Neutrophils Relative %: 58 %
Platelet Count: 260 10*3/uL (ref 150–400)
RBC: 4.06 MIL/uL — ABNORMAL LOW (ref 4.22–5.81)
RDW: 12.4 % (ref 11.5–15.5)
WBC Count: 8.8 10*3/uL (ref 4.0–10.5)
nRBC: 0 % (ref 0.0–0.2)

## 2020-04-18 MED ORDER — PROCHLORPERAZINE MALEATE 10 MG PO TABS
10.0000 mg | ORAL_TABLET | Freq: Four times a day (QID) | ORAL | 0 refills | Status: DC | PRN
Start: 2020-04-18 — End: 2020-09-22

## 2020-04-18 NOTE — Progress Notes (Signed)
[place] [phone]   Fax:([phone]  CONSULT NOTE  REFERRING PHYSICIAN: Dr. Marshell Garfinkel  REASON FOR CONSULTATION:  59 years old white male recently diagnosed with lung cancer.  HPI Richard Santos is a 59 y.o. male with past medical history significant for hypertension, dyslipidemia, GERD, anxiety, seasonal allergy, history of prostate cancer 6 years ago status post seed implants as well as long history of smoking.  The patient was complaining of syncopal episodes few times recently.  He was at work when he had chest tightness and dizzy spells.  He was brought to the emergency department and on 04/11/2020 he had chest x-ray that showed left pulmonary hilar opacity suspicious for perihilar mass/hilar adenopathy.  This was followed by CT angiogram of the chest on the same day and that showed a left hilar/AP window mass measuring 4.7 x 4.5 x 4.8 cm.  This invades the mediastinum and left hilum.  There is encasement of the left upper lobe pulmonary arteries with significant luminal narrowing.  There was also encasement of the distal left mainstem bronchus and its branches.  Within the anterior medial left upper lobe there was a subpleural nodular density that is marked nonspecific and measured 1.5 x 0.9 cm.  There was also a index groundglass density within the left upper lobe measuring 2.0 x 1.1 cm. The patient had a MRI of the brain on 04/11/2020 and it showed a solitary 0.6 cm enhancing mass in the inferior left cerebellum compatible with a solitary brain metastasis.  There was no edema or mass-effect.  There was no other metastatic disease or acute intracranial abnormality identified. On 04/12/2020 the patient underwent bronchoscopy under the care of Dr. Vaughan Browner. 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 The patient was referred to me today for evaluation and recommendation regarding treatment of his condition. When seen today  he is feeling fine except for the chest pressure which is intermittent in addition to mild cough but no significant shortness of breath.  He had few episodes of blood-tinged sputum after the biopsy.  He denied having any current nausea, vomiting, diarrhea or constipation.  He denied having any fever or chills.  He has no headache or visual changes. Family history significant for mother and father who are still alive at age 68 with heart disease.  Father had prostate cancer and sister had breast cancer. The patient is married and has 2 stepdaughters and several grand children.  He owns a [employer].  He was accompanied today by his wife Richard Santos.  The patient has a history of smoking 1 pack/day for around 40 years and quit smoking a week ago after his diagnosis.  He drinks 4-6 beers every day.  He has no history of drug abuse. HPI  Past Medical History:  Diagnosis Date  . Allergic rhinitis   . Allergy   . Anxiety   . Chronic low back pain   . DDD (degenerative disc disease), lumbar   . ED (erectile dysfunction)   . Finger injury    cut pads off 3rd and 4th finger left hand/due to lawn mower accident  . GERD (gastroesophageal reflux disease)   . Heart murmur    as child only-   . History of kidney stones   . HTN (hypertension) 08/20/2016  . Hyperlipidemia   . Incomplete right bundle branch block   . Prostate cancer ([place]) dx 10/02/14   stage T1c  . Prostate cancer ([place])   .  Sigmoid diverticulosis   . Wears glasses     Past Surgical History:  Procedure Laterality Date  . BRONCHIAL BIOPSY  04/12/2020   Procedure: BRONCHIAL BIOPSIES;  Surgeon: Marshell Garfinkel, MD;  Location: [facility];  Service: Cardiopulmonary;;  . BRONCHIAL BRUSHINGS  04/12/2020   Procedure: BRONCHIAL BRUSHINGS;  Surgeon: Marshell Garfinkel, MD;  Location: [facility];  Service: Cardiopulmonary;;  . BRONCHIAL NEEDLE ASPIRATION BIOPSY  04/12/2020   Procedure: BRONCHIAL NEEDLE ASPIRATION BIOPSIES;  Surgeon: Marshell Garfinkel, MD;  Location: [facility];  Service: Cardiopulmonary;;  . BRONCHIAL WASHINGS  04/12/2020   Procedure: BRONCHIAL WASHINGS;  Surgeon: Marshell Garfinkel, MD;  Location: [facility];  Service: Cardiopulmonary;;  . COLONOSCOPY    . COLONOSCOPY W/ POLYPECTOMY  04-19-2012  . ENDOBRONCHIAL ULTRASOUND N/A 04/12/2020   Procedure: ENDOBRONCHIAL ULTRASOUND;  Surgeon: Marshell Garfinkel, MD;  Location: [facility];  Service: Cardiopulmonary;  Laterality: N/A;  . ESOPHAGOGASTRODUODENOSCOPY  08-05-2010  . HEMOSTASIS CONTROL  04/12/2020   Procedure: HEMOSTASIS CONTROL;  Surgeon: Marshell Garfinkel, MD;  Location: [facility];  Service: Cardiopulmonary;;  . POLYPECTOMY    . PROSTATE BIOPSY  10/02/14  . RADIOACTIVE SEED IMPLANT N/A 01/30/2015   Procedure: RADIOACTIVE SEED IMPLANT;  Surgeon: Rana Snare, MD;  Location: [facility];  Service: Urology;  Laterality: N/A;  . UPPER GASTROINTESTINAL ENDOSCOPY    . VIDEO BRONCHOSCOPY N/A 04/12/2020   Procedure: VIDEO BRONCHOSCOPY WITHOUT FLUORO;  Surgeon: Marshell Garfinkel, MD;  Location: [facility];  Service: Cardiopulmonary;  Laterality: N/A;    Family History  Problem Relation Age of Onset  . Prostate cancer Father   . Heart disease Father   . Heart disease Mother   . Breast cancer Sister   . Heart disease Other   . Colon cancer Neg Hx   . Colon polyps Neg Hx   . Esophageal cancer Neg Hx   . Rectal cancer Neg Hx   . Stomach cancer Neg Hx     Social History Social History   Tobacco Use  . Smoking status: Current Every Day Smoker    Packs/day: 1.00    Years: 25.00    Pack years: 25.00    Types: Cigarettes  . Smokeless tobacco: Never Used  Substance Use Topics  . Alcohol use: Yes    Alcohol/week: 49.0 standard drinks    Types: 25 Cans of beer, 24 Standard drinks or equivalent per week    Comment: 5 beer per day  . Drug use: No    Allergies  Allergen Reactions  . Bee Venom Swelling    Current Outpatient Medications   Medication Sig Dispense Refill  . acetaminophen (TYLENOL) 325 MG tablet Take 2 tablets (650 mg total) by mouth every 6 (six) hours as needed for mild pain (or Fever >/= 101). 30 tablet 0  . atorvastatin (LIPITOR) 40 MG tablet Take 1 tablet (40 mg total) by mouth daily. 90 tablet 3  . busPIRone (BUSPAR) 10 MG tablet Take 1 tablet (10 mg total) by mouth 2 (two) times daily. 180 tablet 3  . EPINEPHrine (EPIPEN) 0.3 mg/0.3 mL IJ SOAJ injection Inject 0.3 mLs (0.3 mg total) into the muscle once. 2 Device 2  . folic acid (FOLVITE) 1 MG tablet Take 1 tablet (1 mg total) by mouth daily. 30 tablet 0  . losartan (COZAAR) 50 MG tablet Take 1 tablet (50 mg total) by mouth daily. 90 tablet 3  . Multiple Vitamin (MULTIVITAMIN) capsule Take 1 capsule by mouth daily.    . nicotine (NICODERM CQ -  DOSED IN MG/24 HOURS) 21 mg/24hr patch Place 1 patch (21 mg total) onto the skin daily. 28 patch 0  . pantoprazole (PROTONIX) 40 MG tablet Take 1 tablet (40 mg total) by mouth daily before breakfast. 90 tablet 3  . tamsulosin (FLOMAX) 0.4 MG CAPS capsule Take 1 capsule (0.4 mg total) by mouth daily. (Patient not taking: Reported on 04/11/2020) 90 capsule 3  . traMADol (ULTRAM) 50 MG tablet TAKE HALF TABLET BY MOUTH 2 TIMES DAILY AS NEEDED FOR PAIN 60 tablet 1   No current facility-administered medications for this visit.    Review of Systems  Constitutional: positive for fatigue Eyes: negative Ears, nose, mouth, throat, and face: negative Respiratory: positive for cough and pleurisy/chest pain Cardiovascular: negative Gastrointestinal: negative Genitourinary:negative Integument/breast: negative Hematologic/lymphatic: negative Musculoskeletal:negative Neurological: negative Behavioral/Psych: negative Endocrine: negative Allergic/Immunologic: negative  Physical Exam  DXI:PJASN, healthy, no distress, well nourished, well developed and anxious SKIN: skin color, texture, turgor are normal, no rashes or  significant lesions HEAD: Normocephalic, No masses, lesions, tenderness or abnormalities EYES: normal, PERRLA, Conjunctiva are pink and non-injected EARS: External ears normal, Canals clear OROPHARYNX:no exudate, no erythema and lips, buccal mucosa, and tongue normal  NECK: supple, no adenopathy, no JVD LYMPH:  no palpable lymphadenopathy, no hepatosplenomegaly LUNGS: clear to auscultation , and palpation HEART: regular rate & rhythm, no murmurs and no gallops ABDOMEN:abdomen soft, non-tender, normal bowel sounds and no masses or organomegaly BACK: No CVA tenderness, Range of motion is normal EXTREMITIES:no joint deformities, effusion, or inflammation, no edema  NEURO: alert & oriented x 3 with fluent speech, no focal motor/sensory deficits  PERFORMANCE STATUS: ECOG 1  LABORATORY DATA: Lab Results  Component Value Date   WBC 8.8 04/18/2020   HGB 14.2 04/18/2020   HCT 41.1 04/18/2020   MCV 101.2 (H) 04/18/2020   PLT 260 04/18/2020      Chemistry      Component Value Date/Time   NA 138 04/12/2020 0345   K 3.7 04/12/2020 0345   CL 104 04/12/2020 0345   CO2 27 04/12/2020 0345   BUN 10 04/12/2020 0345   CREATININE 0.71 04/12/2020 0345      Component Value Date/Time   CALCIUM 9.0 04/12/2020 0345   ALKPHOS 64 04/11/2020 1016   AST 41 04/11/2020 1016   ALT 40 04/11/2020 1016   BILITOT 0.6 04/11/2020 1016       RADIOGRAPHIC STUDIES: DG Chest 2 View  Result Date: 04/11/2020 CLINICAL DATA:  Syncopal episode for 2 minutes, intermittent LEFT chest pain over last week or 2 radiating to LEFT axilla, pale and diaphoretic, history hypertension, smoker, prostate cancer EXAM: CHEST - 2 VIEW COMPARISON:  12/21/2014, 04/03/2020 FINDINGS: Normal heart size and pulmonary vascularity. Abnormal enlargement of LEFT pulmonary hilum. Emphysematous and bronchitic changes consistent with COPD. Asymmetric apical scarring greater on LEFT. No acute infiltrate, pleural effusion or pneumothorax.  Probable BILATERAL nipple shadows. Bones demineralized. IMPRESSION: COPD changes with enlargement of LEFT pulmonary hilum, cannot exclude perihilar mass or hilar adenopathy; CT chest with contrast recommended for further assessment. Remainder of exam unremarkable. Findings called to Dr. Johnney Killian on 04/11/2020 at 1038 hours. Electronically Signed   By: Lavonia Dana M.D.   On: 04/11/2020 10:37   DG Chest 2 View  Result Date: 04/04/2020 CLINICAL DATA:  Chest pain EXAM: CHEST - 2 VIEW COMPARISON:  December 21, 2014 FINDINGS: There is slight scarring in the apices. Lungs elsewhere are clear. Heart size and pulmonary vascularity are normal. No adenopathy. There is degenerative change  in the midthoracic spine. IMPRESSION: Slight apical scarring. No edema or airspace opacity. Cardiac silhouette within normal limits. No adenopathy. Electronically Signed   By: Lowella Grip III M.D.   On: 04/04/2020 08:53   CT Angio Chest PE W/Cm &/Or Wo Cm  Result Date: 04/11/2020 CLINICAL DATA:  Chest pain and SOB. Suspected lung mass. EXAM: CT ANGIOGRAPHY CHEST WITH CONTRAST TECHNIQUE: Multidetector CT imaging of the chest was performed using the standard protocol during bolus administration of intravenous contrast. Multiplanar CT image reconstructions and MIPs were obtained to evaluate the vascular anatomy. CONTRAST:  157mL OMNIPAQUE IOHEXOL 350 MG/ML SOLN COMPARISON:  Chest radiograph from 04/11/2020 FINDINGS: Cardiovascular: Satisfactory opacification of the pulmonary arteries to the segmental level. No evidence of pulmonary embolism. Normal heart size. No pericardial effusion. Aortic atherosclerosis. Lad, left circumflex and RCA coronary artery calcifications. Mediastinum/Nodes: Normal appearance of the thyroid gland. The trachea appears patent and is midline. Normal appearance of the esophagus. Left hilar/AP window mass measures 4.7 x 4.5 by 4.8 cm. This invades the mediastinum and left hilum. There is encasement of the left  upper lobe pulmonary arteries with significant luminal narrowing. Encasement of the distal left mainstem bronchus and its branches are also identified, image 81/9. Adjacent left pre-vascular lymph node is borderline enlarged measuring 1.3 cm, image 61/6. No subcarinal, right paratracheal or right hilar adenopathy. No supraclavicular or axillary adenopathy. Lungs/Pleura: No pleural effusion. Centrilobular and paraseptal emphysema identified. Biapical pleuroparenchymal scarring noted. Within the anteromedial left upper lobe there is a subpleural nodular density which is nonspecific measuring 1.5 x 0.9 cm, image 77/7. Patchy areas of ground-glass attenuation are noted within the upper lobes. Index ground-glass density within the left upper lobe measures 2.0 x 1.1 cm, image 48/7. Upper Abdomen: No acute findings within the imaged portions of the upper abdomen. Musculoskeletal: Thoracolumbar scoliosis. No suspicious or acute bone lesions. Review of the MIP images confirms the above findings. IMPRESSION: 1. No evidence for acute pulmonary embolus. 2. Left hilar/AP window mass is identified which invades the mediastinum and left hilum. Highly concerning for primary bronchogenic carcinoma. Encasement of the left upper lobe pulmonary arteries and its branches is noted with significant luminal narrowing. Also noted is a borderline enlarged left pre-vascular lymph node, which likely represents a metastatic lymph node. Recommend referral to pulmonary medicine/thoracic surgery multi disciplinary thoracic oncology group for further workup and management. 3. There is a nonspecific subpleural nodular density in the anteromedial left upper lobe measuring 1.5 cm. This could be further addressed at PET-CT. 4. Patchy areas of ground-glass attenuation are noted within the upper lobes. These are nonspecific and without previous imaging for comparison may be inflammatory or infectious in etiology. 5. Emphysema and aortic atherosclerosis.  Coronary artery calcifications. Aortic Atherosclerosis (ICD10-I70.0) and Emphysema (ICD10-J43.9). Electronically Signed   By: Kerby Moors M.D.   On: 04/11/2020 13:22   MR BRAIN W WO CONTRAST  Result Date: 04/11/2020 CLINICAL DATA:  59 year old male with recently diagnosed left lung mass. Staging. EXAM: MRI HEAD WITHOUT AND WITH CONTRAST TECHNIQUE: Multiplanar, multiecho pulse sequences of the brain and surrounding structures were obtained without and with intravenous contrast. CONTRAST:  7.51mL GADAVIST GADOBUTROL 1 MMOL/ML IV SOLN COMPARISON:  CTA chest earlier today. FINDINGS: Brain: 6 mm small enhancing mass in the inferior left cerebellum (series 10, image 6). No significant cerebellar edema. No mass effect. This does have mildly Abner diffusion on the basis of hypercellularity. No other abnormal intracranial enhancement identified. Small right cingulate gyrus developmental venous anomaly (DVA series  11, image 15, normal variant). No dural thickening identified. No midline shift or intracranial mass effect. No restricted diffusion suggestive of acute infarction. No ventriculomegaly, extra-axial collection or acute intracranial hemorrhage. Cervicomedullary junction and pituitary are within normal limits. No cortical encephalomalacia or chronic cerebral blood products identified. Mild to moderate for age nonspecific white matter signal changes. Vascular: Major intracranial vascular flow voids are preserved. The major dural venous sinuses are enhancing and appear to be patent. Skull and upper cervical spine: Negative visible cervical spine. Normal bone marrow signal. Sinuses/Orbits: Negative orbits. Bilateral paranasal sinus mucosal thickening and opacification. Other: Trace right mastoid effusion. Negative nasopharynx. Visible internal auditory structures appear normal. Scalp and face soft tissues appear negative. IMPRESSION: 1. Solitary 6 mm enhancing mass in the inferior left cerebellum compatible with a  solitary brain metastasis. No edema or mass effect. 2. No other metastatic disease or acute intracranial abnormality identified. Mild to moderate for age nonspecific white matter signal changes, most commonly due to chronic small vessel disease. 3. Bilateral paranasal sinus disease. Electronically Signed   By: Genevie Ann M.D.   On: 04/11/2020 19:23   DG CHEST PORT 1 VIEW  Result Date: 04/12/2020 CLINICAL DATA:  Lung mass, post bronchoscopy today EXAM: PORTABLE CHEST 1 VIEW COMPARISON:  Portable exam 1449 hours compared to 04/11/2020 FINDINGS: Normal heart size and pulmonary vascularity. Slight rotation to the RIGHT, which decreases the prominence of the previously identified RIGHT hilar mass. Scattered LEFT perihilar infiltrate, likely sequela of bronchoscopy and biopsy. No pleural effusion or pneumothorax. IMPRESSION: Mild LEFT perihilar infiltrate likely related to preceding bronchoscopy and biopsy. No pleural effusion or pneumothorax identified. Previously identified LEFT hilar mass less well demonstrated on current portable exam rotated to the RIGHT. Electronically Signed   By: Lavonia Dana M.D.   On: 04/12/2020 15:15   ECHOCARDIOGRAM COMPLETE  Result Date: 04/12/2020    ECHOCARDIOGRAM REPORT   Patient Name:   Richard Santos Date of Exam: 04/12/2020 Medical Rec #:  [id number]      Height:       72.0 in Accession #:    [id number]     Weight:       160.5 lb Date of Birth:  [birth year]      BSA:          1.940 m Patient Age:    13 years       BP:           119/78 mmHg Patient Gender: M              HR:           79 bpm. Exam Location:  Inpatient Procedure: 2D Echo Indications:    Syncope 780.2 / R55  History:        Patient has no prior history of Echocardiogram examinations.                 Signs/Symptoms:Syncope; Risk Factors:Tobacco abuse, Hypertension                 and Dyslipidemia. Alcohol abuse.  Sonographer:    Vikki Ports Turrentine Referring Phys: [id number] [place]  1. Left ventricular  ejection fraction, by estimation, is 50 to 55%. The left ventricle has low normal function. The left ventricle has no regional wall motion abnormalities. Left ventricular diastolic parameters were normal.  2. Right ventricular systolic function is normal. The right ventricular size is normal. Tricuspid regurgitation signal is inadequate for assessing PA pressure.  3. The  mitral valve is normal in structure. No evidence of mitral valve regurgitation. No evidence of mitral stenosis.  4. The aortic valve is tricuspid. Aortic valve regurgitation is not visualized. No aortic stenosis is present.  5. The inferior vena cava is normal in size with greater than 50% respiratory variability, suggesting right atrial pressure of 3 mmHg. FINDINGS  Left Ventricle: Left ventricular ejection fraction, by estimation, is 50 to 55%. The left ventricle has low normal function. The left ventricle has no regional wall motion abnormalities. The left ventricular internal cavity size was normal in size. There is no left ventricular hypertrophy. Left ventricular diastolic parameters were normal. Right Ventricle: The right ventricular size is normal. No increase in right ventricular wall thickness. Right ventricular systolic function is normal. Tricuspid regurgitation signal is inadequate for assessing PA pressure. Left Atrium: Left atrial size was normal in size. Right Atrium: Right atrial size was normal in size. Pericardium: There is no evidence of pericardial effusion. Mitral Valve: The mitral valve is normal in structure. Normal mobility of the mitral valve leaflets. No evidence of mitral valve regurgitation. No evidence of mitral valve stenosis. Tricuspid Valve: The tricuspid valve is normal in structure. Tricuspid valve regurgitation is not demonstrated. No evidence of tricuspid stenosis. Aortic Valve: The aortic valve is tricuspid. Aortic valve regurgitation is not visualized. No aortic stenosis is present. Pulmonic Valve: The pulmonic  valve was normal in structure. Pulmonic valve regurgitation is trivial. No evidence of pulmonic stenosis. Aorta: The aortic root is normal in size and structure. Venous: The inferior vena cava is normal in size with greater than 50% respiratory variability, suggesting right atrial pressure of 3 mmHg. IAS/Shunts: No atrial level shunt detected by color flow Doppler.  LEFT VENTRICLE PLAX 2D LVIDd:         5.00 cm  Diastology LVIDs:         3.70 cm  LV e' lateral:   14.00 cm/s LV PW:         0.70 cm  LV E/e' lateral: 4.3 LV IVS:        0.70 cm  LV e' medial:    9.25 cm/s LVOT diam:     2.10 cm  LV E/e' medial:  6.5 LV SV:         69 LV SV Index:   36 LVOT Area:     3.46 cm  RIGHT VENTRICLE RV S prime:     11.20 cm/s TAPSE (M-mode): 2.2 cm LEFT ATRIUM             Index LA diam:        3.60 cm 1.86 cm/m LA Vol (A2C):   64.4 ml 33.19 ml/m LA Vol (A4C):   43.9 ml 22.62 ml/m LA Biplane Vol: 53.3 ml 27.47 ml/m  AORTIC VALVE LVOT Vmax:   98.70 cm/s LVOT Vmean:  70.700 cm/s LVOT VTI:    0.199 m  AORTA Ao Root diam: 3.40 cm MITRAL VALVE MV Area (PHT): 3.65 cm    SHUNTS MV Decel Time: 208 msec    Systemic VTI:  0.20 m MV E velocity: 60.00 cm/s  Systemic Diam: 2.10 cm MV A velocity: 64.30 cm/s MV E/A ratio:  0.93 Cherlynn Kaiser MD Electronically signed by Cherlynn Kaiser MD Signature Date/Time: 04/12/2020/12:50:40 PM    Final     ASSESSMENT: This is a very pleasant 59 years old white male recently diagnosed with extensive stage (T2b, N2, M1c) small cell lung cancer presented with left upper lobe pulmonary nodule in  addition to left hilar mass with mediastinal invasion and solitary brain metastasis diagnosed in May 2021  PLAN: I had a lengthy discussion with the patient and his wife about his current disease stage, prognosis and treatment options. I personally and independently reviewed the scan images and discussed the result and showed the images to the patient and his wife. I recommended for the patient to complete  the staging work-up by ordering a PET scan for further evaluation of his disease and to rule out any other metastatic lesions. I explained to the patient that he has incurable condition and all the treatment will be of palliative nature. I discussed with the patient his treatment options including palliative care and hospice referral versus consideration of palliative systemic chemotherapy with carboplatin for AUC of 5 on day 1, etoposide 100 mg/M2 on days 1, 2 and 3 as well as Imfinzi 1500 mg IV every 3 weeks with the chemotherapy then every 4 weeks as maintenance after cycle #4 if the patient has no evidence for disease progression.  I would also consider the patient for treatment with Cosela as a myeloprotective agent during his chemotherapy. I discussed with the patient the adverse effect of this treatment including but not limited to alopecia, myelosuppression, nausea and vomiting, peripheral neuropathy, liver or renal dysfunction as well as immunotherapy adverse effects. 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  This event is very important for him and he would not like to miss it. I will arrange for the patient to start the first cycle of his treatment on May 06, 2020 and cycle #2 on June 03, 2020. In the meantime because of the delay in starting his chemotherapy to fit his schedule, I will refer the patient to radiation oncology for consideration of short course of palliative radiotherapy to the obstructive left hilar/mediastinal mass. The brain metastasis is solitary and measure only 0.6 cm and its likely to response to chemotherapy but we will continue to monitor it closely on upcoming imaging studies and if no improvement at the patient would benefit from radiotherapy to the brain. The patient and his wife agreed to the current plan. He will have a chemotherapy education class before  the first dose of his treatment. I will arrange for the patient to come back for follow-up visit on the first day of his chemotherapy. I will send a prescription of Compazine 10 mg p.o. every 6 hours as needed for nausea to his pharmacy. I strongly encouraged the patient to continue smoking cessation and also to decrease the amount of alcohol drinking on daily basis. He was advised to call immediately if he has any concerning symptoms in the interval.  The patient voices understanding of current disease status and treatment options and is in agreement with the current care plan.  All questions were answered. The patient knows to call the clinic with any problems, questions or concerns. We can certainly see the patient much sooner if necessary.  Thank you so much for allowing me to participate in the care of Richard Santos. I will continue to follow up the patient with you and assist in his care. The total time spent in the appointment was 90 minutes.  Disclaimer: This note was dictated with voice recognition software. Similar sounding words can inadvertently be transcribed and may not be corrected upon review.   Eilleen Kempf  Apr 18, 2020, 3:00 PM

## 2020-04-18 NOTE — Progress Notes (Signed)
START ON PATHWAY REGIMEN - Small Cell Lung     Cycles 1 through 4: A cycle is every 21 days:     Durvalumab      Carboplatin      Etoposide    Cycles 5 and beyond: A cycle is every 28 days:     Durvalumab   **Always confirm dose/schedule in your pharmacy ordering system**  Patient Characteristics: Newly Diagnosed, Preoperative or Nonsurgical Candidate (Clinical Staging), First Line, Extensive Stage Therapeutic Status: Newly Diagnosed, Preoperative or Nonsurgical Candidate (Clinical Staging) AJCC T Category: cT2b AJCC N Category: cN2 AJCC M Category: cM1c AJCC 8 Stage Grouping: IVB Stage Classification: Extensive Intent of Therapy: Non-Curative / Palliative Intent, Discussed with Patient

## 2020-04-19 ENCOUNTER — Telehealth: Payer: Self-pay | Admitting: Physician Assistant

## 2020-04-19 NOTE — Progress Notes (Signed)
Thoracic Location of Tumor / Histology: Left Upper Lobe pulmonary nodule, left hilar mass with mediastinal invasion and solitary brain metastasis.  Patient presented with episodes of syncope and chest tightness.  Blacked out at work.  PET 05/02/2020:  Bronchoscopy 04/12/2020  MRI Brain 04/11/2020: solitary 0.6 cm enhancing mass in the inferior left cerebellum compatible with a solitary brain metastasis.  There was no edema or mass-effect.  There was no other metastatic disease or acute intracranial abnormality identified.  CTA Chest 04/11/2020:  left hilar/AP window mass measuring 4.7 x 4.5 x 4.8 cm.  This invades the mediastinum and left hilum.  There is encasement of the left upper lobe pulmonary arteries with significant luminal narrowing.  There was also encasement of the distal left mainstem bronchus and its branches.  Within the anterior medial left upper lobe there was a subpleural nodular density that is marked nonspecific and measured 1.5 x 0.9 cm.  There was also a index groundglass density within the left upper lobe measuring 2.0 x 1.1 cm.  Chest x ray 04/11/2020: Left pulmonary hilar opacity suspicious for perihilar mass/hilar adenopathy.  Biopsies of Left Lung 04/12/2020   Tobacco/Marijuana/Snuff/ETOH use: Current Smoker  Past/Anticipated interventions by cardiothoracic surgery, if any:   Past/Anticipated interventions by medical oncology, if any:  Dr. Julien Nordmann 04/18/2020 -I recommended for the patient to complete the staging work-up by ordering a PET scan for further evaluation of his disease and to rule out any other metastatic lesions. -I explained to the patient that he has incurable condition and all the treatment will be of palliative nature. -I discussed with the patient his treatment options including palliative care and hospice referral versus consideration of palliative systemic chemotherapy with carboplatin for AUC of 5 on day 1, etoposide 100 mg/M2 on days 1, 2 and 3 as well  as Imfinzi 1500 mg IV every 3 weeks with the chemotherapy then every 4 weeks as maintenance after cycle #4 if the patient has no evidence for disease progression.  I would also consider the patient for treatment with Cosela as a myeloprotective agent during his chemotherapy. 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  This event is very important for him and he would not like to miss it. -I will arrange for the patient to start the first cycle of his treatment on May 06, 2020 and cycle #2 on June 03, 2020. -In the meantime because of the delay in starting his chemotherapy to fit his schedule, I will refer the patient to radiation oncology for consideration of short course of palliative radiotherapy to the obstructive left hilar/mediastinal mass. -The brain metastasis is solitary and measure only 0.6 cm and its likely to response to chemotherapy but we will continue to monitor it closely on upcoming imaging studies and if no improvement at the patient would benefit from radiotherapy to the brain.  Signs/Symptoms  Weight changes, if any: No  Respiratory complaints, if any: Little bit of SOB, especially with exertion.  Hemoptysis, if any: No, occasional non-productive cough.  Pain issues, if any:  Occasional chest pain.  SAFETY ISSUES:  Prior radiation? Seed implants for Prostate Cancer 2015  Pacemaker/ICD? No  Possible current pregnancy? n/a  Is the patient on methotrexate? No  Current Complaints / other details:

## 2020-04-19 NOTE — Telephone Encounter (Signed)
Received an after hours note that this patient called and was concerned about scheduling his chemotherapy. Dr. Julien Nordmann wrote a detailed LOS with appointments yesterday. Talked to the patient and let him know it may take a few days to schedule his treatment on 6/14, 6/15, and 6/16 and they will likely schedule this early next week and will call him with the appointment times. He also had some questions about his treatment. I told him he will have a more detailed discussion about his treatment/drug information when he attends his chemotherapy education class which will likely be scheduled next week or the following week. He expressed understanding. He also mentioned he has his consultation with Dr. Lisbeth Renshaw next week for his radiotherapy.

## 2020-04-23 ENCOUNTER — Encounter: Payer: Self-pay | Admitting: *Deleted

## 2020-04-23 ENCOUNTER — Ambulatory Visit: Payer: 59 | Admitting: Internal Medicine

## 2020-04-23 ENCOUNTER — Ambulatory Visit
Admission: RE | Admit: 2020-04-23 | Discharge: 2020-04-23 | Disposition: A | Discharge status: Home / Self Care | Payer: 59 | Source: Ambulatory Visit | Attending: Radiation Oncology | Admitting: Radiation Oncology

## 2020-04-23 ENCOUNTER — Other Ambulatory Visit: Payer: Self-pay

## 2020-04-23 ENCOUNTER — Telehealth: Payer: Self-pay | Admitting: Medical Oncology

## 2020-04-23 ENCOUNTER — Telehealth: Payer: Self-pay | Admitting: *Deleted

## 2020-04-23 ENCOUNTER — Other Ambulatory Visit: Payer: 59

## 2020-04-23 ENCOUNTER — Encounter: Payer: Self-pay | Admitting: Radiation Oncology

## 2020-04-23 VITALS — Ht 72.0 in | Wt 159.0 lb

## 2020-04-23 DIAGNOSIS — K219 Gastro-esophageal reflux disease without esophagitis: Secondary | ICD-10-CM | POA: Diagnosis not present

## 2020-04-23 DIAGNOSIS — Z79899 Other long term (current) drug therapy: Secondary | ICD-10-CM | POA: Diagnosis not present

## 2020-04-23 DIAGNOSIS — Z8546 Personal history of malignant neoplasm of prostate: Secondary | ICD-10-CM | POA: Diagnosis not present

## 2020-04-23 DIAGNOSIS — C3412 Malignant neoplasm of upper lobe, left bronchus or lung: Secondary | ICD-10-CM | POA: Diagnosis present

## 2020-04-23 DIAGNOSIS — Z87442 Personal history of urinary calculi: Secondary | ICD-10-CM | POA: Diagnosis not present

## 2020-04-23 DIAGNOSIS — Z5111 Encounter for antineoplastic chemotherapy: Secondary | ICD-10-CM | POA: Diagnosis present

## 2020-04-23 DIAGNOSIS — J449 Chronic obstructive pulmonary disease, unspecified: Secondary | ICD-10-CM | POA: Diagnosis not present

## 2020-04-23 DIAGNOSIS — Z791 Long term (current) use of non-steroidal anti-inflammatories (NSAID): Secondary | ICD-10-CM | POA: Diagnosis not present

## 2020-04-23 DIAGNOSIS — I10 Essential (primary) hypertension: Secondary | ICD-10-CM | POA: Diagnosis not present

## 2020-04-23 DIAGNOSIS — Z5112 Encounter for antineoplastic immunotherapy: Secondary | ICD-10-CM | POA: Diagnosis present

## 2020-04-23 DIAGNOSIS — C7931 Secondary malignant neoplasm of brain: Secondary | ICD-10-CM | POA: Diagnosis not present

## 2020-04-23 DIAGNOSIS — E785 Hyperlipidemia, unspecified: Secondary | ICD-10-CM | POA: Diagnosis not present

## 2020-04-23 DIAGNOSIS — F419 Anxiety disorder, unspecified: Secondary | ICD-10-CM | POA: Diagnosis not present

## 2020-04-23 NOTE — Progress Notes (Signed)
Oncology Nurse Navigator Documentation  Oncology Nurse Navigator Flowsheets 04/23/2020  Abnormal Finding Date 04/11/2020  Confirmed Diagnosis Date 04/12/2020  Diagnosis Status Confirmed Diagnosis Complete  Planned Course of Treatment Chemotherapy;Radiation  Phase of Treatment Radiation  Navigator Follow Up Date: 04/23/2020  Navigator Follow Up Reason: Appointment Review  Navigator Location [facility]   Navigator Encounter Type Clinic/MDC;Other:  Patient Visit Type MedOnc;Other/I met Richard Santos last week during his first visit with Dr. Julien Nordmann.  He is newly dx small cell lung cancer.  I help to explain plan of care.   Today, I followed up on Richard Santos schedule and he is not set up for his chemo at this time.  I contacted scheduling to get him scheduled.   Treatment Phase Pre-Tx/Tx Discussion;Other  Barriers/Navigation Needs Coordination of Care;Education  Education Newly Diagnosed Cancer Education;Other  Interventions Coordination of Care;Education;Psycho-Social Support  Acuity Level 2-Minimal Needs (1-2 Barriers Identified)  Coordination of Care Other  Education Method Verbal;Written  Time Spent with Patient 60

## 2020-04-23 NOTE — Addendum Note (Signed)
Encounter addended by: Hayden Pedro, PA-C on: 04/23/2020 3:48 PM  Actions taken: Clinical Note Signed

## 2020-04-23 NOTE — Telephone Encounter (Signed)
I called patient and updated him on his schedule for chemo ed and 1st chemo.  I was unable to reach but did leave vm message with an update on schedule and to call if he had any questions.

## 2020-04-23 NOTE — Progress Notes (Addendum)
Radiation Oncology         ([phone]) [phone] ________________________________  Initial Outpatient Consultation - Conducted via telephone due to current COVID-19 concerns for limiting patient exposure  I spoke with the patient to conduct this consult visit via telephone to spare the patient unnecessary potential exposure in the healthcare setting during the current COVID-19 pandemic. The patient was notified in advance and was offered a [place] meeting to allow for face to face communication but unfortunately reported that they did not have the appropriate resources/technology to support such a visit and instead preferred to proceed with a telephone consult.   Name: Richard Santos        MRN: [id number]  Date of Service: 04/23/2020 DOB: 08/02/1961  HW:TUUE, Hunt Oris, MD  Curt Bears, MD     REFERRING PHYSICIAN: Curt Bears, MD   DIAGNOSIS: The encounter diagnosis was Small cell lung cancer, left upper lobe ([place]).   HISTORY OF PRESENT ILLNESS: Richard Santos is a 58 y.o. male seen at the request of Dr. Julien Nordmann for a newly diagnosed extensive stage small cell lung cancer.  Patient had a history of prostate cancer that was treated about 6 years ago with radioactive seed placement.  He recently developed episodes of syncope and was brought to the emergency department on 04/11/2020.  Chest x-ray during his work-up revealed a left hilar opacity in the lung region suspicious for perihilar mass or hilar adenopathy.  CT that same day revealed a mass in the left hilar/AP window region measuring 4.7 x 4.5 x 4.8 cm invading the mediastinum and hilum as well as encasement of the left upper lobe pulmonary vessels with luminal narrowing.  There is also encasement of the distal left mainstem bronchus and branches, there was also a subpleural nodular density measuring 1.5 x 0.9 cm along the anterior medial left upper lobe and groundglass density within the left upper lobe measuring 2 x 1.1 cm.  An MRI of the brain  that day also revealed a 6 mm enhancing mass in the inferior left cerebellum no edema or mass-effect was identified.  He was taken to for bronchoscopy on 04/12/2020 with Dr. Vaughan Browner, final results of his procedure revealed a small cell carcinoma.  He has met with Dr. Julien Nordmann and plans to begin systemic carbo etoposide chemotherapy along with Imfinzi immunotherapy.  He is contacted today to discuss options of palliative radiotherapy to the chest is Dr. Julien Nordmann would like to see how he responds in the brain to systemic therapy and then consider radiotherapy if this is not improved.    PREVIOUS RADIATION THERAPY: Yes   2016:  The prostate was treated with radioactive seeds with the following prescription: Prostate 14,500 cGy. Isotope: I-125 utilizing 79 seeds and 29 active needles.  Individual seed activity 0.47 mCi per seed for a total implant activity of 37.13 mCi.   PAST MEDICAL HISTORY:  Past Medical History:  Diagnosis Date  . Allergic rhinitis   . Allergy   . Anxiety   . Chronic low back pain   . DDD (degenerative disc disease), lumbar   . ED (erectile dysfunction)   . Finger injury    cut pads off 3rd and 4th finger left hand/due to lawn mower accident  . GERD (gastroesophageal reflux disease)   . Heart murmur    as child only-   . History of kidney stones   . HTN (hypertension) 08/20/2016  . Hyperlipidemia   . Incomplete right bundle branch block   . Prostate cancer (  [place]) dx 10/02/14   stage T1c  . Prostate cancer ([place])   . Sigmoid diverticulosis   . Wears glasses        PAST SURGICAL HISTORY: Past Surgical History:  Procedure Laterality Date  . BRONCHIAL BIOPSY  04/12/2020   Procedure: BRONCHIAL BIOPSIES;  Surgeon: Marshell Garfinkel, MD;  Location: [facility];  Service: Cardiopulmonary;;  . BRONCHIAL BRUSHINGS  04/12/2020   Procedure: BRONCHIAL BRUSHINGS;  Surgeon: Marshell Garfinkel, MD;  Location: [facility];  Service: Cardiopulmonary;;  . BRONCHIAL NEEDLE ASPIRATION  BIOPSY  04/12/2020   Procedure: BRONCHIAL NEEDLE ASPIRATION BIOPSIES;  Surgeon: Marshell Garfinkel, MD;  Location: [facility];  Service: Cardiopulmonary;;  . BRONCHIAL WASHINGS  04/12/2020   Procedure: BRONCHIAL WASHINGS;  Surgeon: Marshell Garfinkel, MD;  Location: [facility];  Service: Cardiopulmonary;;  . COLONOSCOPY    . COLONOSCOPY W/ POLYPECTOMY  04-19-2012  . ENDOBRONCHIAL ULTRASOUND N/A 04/12/2020   Procedure: ENDOBRONCHIAL ULTRASOUND;  Surgeon: Marshell Garfinkel, MD;  Location: [facility];  Service: Cardiopulmonary;  Laterality: N/A;  . ESOPHAGOGASTRODUODENOSCOPY  08-05-2010  . HEMOSTASIS CONTROL  04/12/2020   Procedure: HEMOSTASIS CONTROL;  Surgeon: Marshell Garfinkel, MD;  Location: [facility];  Service: Cardiopulmonary;;  . POLYPECTOMY    . PROSTATE BIOPSY  10/02/14  . RADIOACTIVE SEED IMPLANT N/A 01/30/2015   Procedure: RADIOACTIVE SEED IMPLANT;  Surgeon: Rana Snare, MD;  Location: [facility];  Service: Urology;  Laterality: N/A;  . UPPER GASTROINTESTINAL ENDOSCOPY    . VIDEO BRONCHOSCOPY N/A 04/12/2020   Procedure: VIDEO BRONCHOSCOPY WITHOUT FLUORO;  Surgeon: Marshell Garfinkel, MD;  Location: [facility];  Service: Cardiopulmonary;  Laterality: N/A;     FAMILY HISTORY:  Family History  Problem Relation Age of Onset  . Prostate cancer Father   . Heart disease Father   . Heart disease Mother   . Breast cancer Sister   . Heart disease Other   . Colon cancer Neg Hx   . Colon polyps Neg Hx   . Esophageal cancer Neg Hx   . Rectal cancer Neg Hx   . Stomach cancer Neg Hx      SOCIAL HISTORY:  reports that he has been smoking cigarettes. He has a 25.00 pack-year smoking history. He has never used smokeless tobacco. He reports current alcohol use of about 49.0 standard drinks of alcohol per week. He reports that he does not use drugs. The patient is married and lives in [place]. 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   ALLERGIES: Bee  venom   MEDICATIONS:  Current Outpatient Medications  Medication Sig Dispense Refill  . acetaminophen (TYLENOL) 325 MG tablet Take 2 tablets (650 mg total) by mouth every 6 (six) hours as needed for mild pain (or Fever >/= 101). 30 tablet 0  . atorvastatin (LIPITOR) 40 MG tablet Take 1 tablet (40 mg total) by mouth daily. 90 tablet 3  . busPIRone (BUSPAR) 10 MG tablet Take 1 tablet (10 mg total) by mouth 2 (two) times daily. 180 tablet 3  . EPINEPHrine (EPIPEN) 0.3 mg/0.3 mL IJ SOAJ injection Inject 0.3 mLs (0.3 mg total) into the muscle once. 2 Device 2  . folic acid (FOLVITE) 1 MG tablet Take 1 tablet (1 mg total) by mouth daily. 30 tablet 0  . losartan (COZAAR) 50 MG tablet Take 1 tablet (50 mg total) by mouth daily. 90 tablet 3  . Multiple Vitamin (MULTIVITAMIN) capsule Take 1 capsule by mouth daily.    . nicotine (NICODERM CQ - DOSED IN MG/24 HOURS)  21 mg/24hr patch Place 1 patch (21 mg total) onto the skin daily. 28 patch 0  . pantoprazole (PROTONIX) 40 MG tablet Take 1 tablet (40 mg total) by mouth daily before breakfast. 90 tablet 3  . prochlorperazine (COMPAZINE) 10 MG tablet Take 1 tablet (10 mg total) by mouth every 6 (six) hours as needed for nausea or vomiting. 30 tablet 0  . tamsulosin (FLOMAX) 0.4 MG CAPS capsule Take 1 capsule (0.4 mg total) by mouth daily. (Patient not taking: Reported on 04/11/2020) 90 capsule 3  . traMADol (ULTRAM) 50 MG tablet TAKE HALF TABLET BY MOUTH 2 TIMES DAILY AS NEEDED FOR PAIN 60 tablet 1   No current facility-administered medications for this encounter.     REVIEW OF SYSTEMS: On review of systems, the patient reports that he is doing well overall. He is worried about the financial implication of treatments as his wife is disabled and he is self employed. He reports he does have shortness of breath with exertion. He denies any chest pain or shortness of breath currently. No other complaints are verbalized.    PHYSICAL EXAM:  Wt Readings from Last  3 Encounters:  04/18/20 159 lb 6.4 oz (72.3 kg)  04/12/20 160 lb 7.9 oz (72.8 kg)  04/03/20 166 lb (75.3 kg)   Unable to assess due to encounter type.   ECOG = 0  0 - Asymptomatic (Fully active, able to carry on all predisease activities without restriction)  1 - Symptomatic but completely ambulatory (Restricted in physically strenuous activity but ambulatory and able to carry out work of a light or sedentary nature. For example, light housework, office work)  2 - Symptomatic, <50% in bed during the day (Ambulatory and capable of all self care but unable to carry out any work activities. Up and about more than 50% of waking hours)  3 - Symptomatic, >50% in bed, but not bedbound (Capable of only limited self-care, confined to bed or chair 50% or more of waking hours)  4 - Bedbound (Completely disabled. Cannot carry on any self-care. Totally confined to bed or chair)  5 - Death   Eustace Pen MM, Creech RH, Tormey DC, et al. [phone]). "Toxicity and response criteria of the [facility] Group". [place] Oncol. 5 (6): 649-55    LABORATORY DATA:  Lab Results  Component Value Date   WBC 8.8 04/18/2020   HGB 14.2 04/18/2020   HCT 41.1 04/18/2020   MCV 101.2 (H) 04/18/2020   PLT 260 04/18/2020   Lab Results  Component Value Date   NA 139 04/18/2020   K 4.2 04/18/2020   CL 102 04/18/2020   CO2 26 04/18/2020   Lab Results  Component Value Date   ALT 44 04/18/2020   AST 33 04/18/2020   ALKPHOS 59 04/18/2020   BILITOT 0.3 04/18/2020      RADIOGRAPHY: DG Chest 2 View  Result Date: 04/11/2020 CLINICAL DATA:  Syncopal episode for 2 minutes, intermittent LEFT chest pain over last week or 2 radiating to LEFT axilla, pale and diaphoretic, history hypertension, smoker, prostate cancer EXAM: CHEST - 2 VIEW COMPARISON:  12/21/2014, 04/03/2020 FINDINGS: Normal heart size and pulmonary vascularity. Abnormal enlargement of LEFT pulmonary hilum. Emphysematous and bronchitic  changes consistent with COPD. Asymmetric apical scarring greater on LEFT. No acute infiltrate, pleural effusion or pneumothorax. Probable BILATERAL nipple shadows. Bones demineralized. IMPRESSION: COPD changes with enlargement of LEFT pulmonary hilum, cannot exclude perihilar mass or hilar adenopathy; CT chest with contrast recommended for further  assessment. Remainder of exam unremarkable. Findings called to Dr. Johnney Killian on 04/11/2020 at 1038 hours. Electronically Signed   By: Lavonia Dana M.D.   On: 04/11/2020 10:37   DG Chest 2 View  Result Date: 04/04/2020 CLINICAL DATA:  Chest pain EXAM: CHEST - 2 VIEW COMPARISON:  December 21, 2014 FINDINGS: There is slight scarring in the apices. Lungs elsewhere are clear. Heart size and pulmonary vascularity are normal. No adenopathy. There is degenerative change in the midthoracic spine. IMPRESSION: Slight apical scarring. No edema or airspace opacity. Cardiac silhouette within normal limits. No adenopathy. Electronically Signed   By: Lowella Grip III M.D.   On: 04/04/2020 08:53   CT Angio Chest PE W/Cm &/Or Wo Cm  Result Date: 04/11/2020 CLINICAL DATA:  Chest pain and SOB. Suspected lung mass. EXAM: CT ANGIOGRAPHY CHEST WITH CONTRAST TECHNIQUE: Multidetector CT imaging of the chest was performed using the standard protocol during bolus administration of intravenous contrast. Multiplanar CT image reconstructions and MIPs were obtained to evaluate the vascular anatomy. CONTRAST:  129m OMNIPAQUE IOHEXOL 350 MG/ML SOLN COMPARISON:  Chest radiograph from 04/11/2020 FINDINGS: Cardiovascular: Satisfactory opacification of the pulmonary arteries to the segmental level. No evidence of pulmonary embolism. Normal heart size. No pericardial effusion. Aortic atherosclerosis. Lad, left circumflex and RCA coronary artery calcifications. Mediastinum/Nodes: Normal appearance of the thyroid gland. The trachea appears patent and is midline. Normal appearance of the esophagus.  Left hilar/AP window mass measures 4.7 x 4.5 by 4.8 cm. This invades the mediastinum and left hilum. There is encasement of the left upper lobe pulmonary arteries with significant luminal narrowing. Encasement of the distal left mainstem bronchus and its branches are also identified, image 81/9. Adjacent left pre-vascular lymph node is borderline enlarged measuring 1.3 cm, image 61/6. No subcarinal, right paratracheal or right hilar adenopathy. No supraclavicular or axillary adenopathy. Lungs/Pleura: No pleural effusion. Centrilobular and paraseptal emphysema identified. Biapical pleuroparenchymal scarring noted. Within the anteromedial left upper lobe there is a subpleural nodular density which is nonspecific measuring 1.5 x 0.9 cm, image 77/7. Patchy areas of ground-glass attenuation are noted within the upper lobes. Index ground-glass density within the left upper lobe measures 2.0 x 1.1 cm, image 48/7. Upper Abdomen: No acute findings within the imaged portions of the upper abdomen. Musculoskeletal: Thoracolumbar scoliosis. No suspicious or acute bone lesions. Review of the MIP images confirms the above findings. IMPRESSION: 1. No evidence for acute pulmonary embolus. 2. Left hilar/AP window mass is identified which invades the mediastinum and left hilum. Highly concerning for primary bronchogenic carcinoma. Encasement of the left upper lobe pulmonary arteries and its branches is noted with significant luminal narrowing. Also noted is a borderline enlarged left pre-vascular lymph node, which likely represents a metastatic lymph node. Recommend referral to pulmonary medicine/thoracic surgery multi disciplinary thoracic oncology group for further workup and management. 3. There is a nonspecific subpleural nodular density in the anteromedial left upper lobe measuring 1.5 cm. This could be further addressed at PET-CT. 4. Patchy areas of ground-glass attenuation are noted within the upper lobes. These are nonspecific  and without previous imaging for comparison may be inflammatory or infectious in etiology. 5. Emphysema and aortic atherosclerosis. Coronary artery calcifications. Aortic Atherosclerosis (ICD10-I70.0) and Emphysema (ICD10-J43.9). Electronically Signed   By: TKerby MoorsM.D.   On: 04/11/2020 13:22   MR BRAIN W WO CONTRAST  Result Date: 04/11/2020 CLINICAL DATA:  59year old male with recently diagnosed left lung mass. Staging. EXAM: MRI HEAD WITHOUT AND WITH CONTRAST TECHNIQUE: Multiplanar, multiecho  pulse sequences of the brain and surrounding structures were obtained without and with intravenous contrast. CONTRAST:  7.65m GADAVIST GADOBUTROL 1 MMOL/ML IV SOLN COMPARISON:  CTA chest earlier today. FINDINGS: Brain: 6 mm small enhancing mass in the inferior left cerebellum (series 10, image 6). No significant cerebellar edema. No mass effect. This does have mildly Abner diffusion on the basis of hypercellularity. No other abnormal intracranial enhancement identified. Small right cingulate gyrus developmental venous anomaly (DVA series 11, image 15, normal variant). No dural thickening identified. No midline shift or intracranial mass effect. No restricted diffusion suggestive of acute infarction. No ventriculomegaly, extra-axial collection or acute intracranial hemorrhage. Cervicomedullary junction and pituitary are within normal limits. No cortical encephalomalacia or chronic cerebral blood products identified. Mild to moderate for age nonspecific white matter signal changes. Vascular: Major intracranial vascular flow voids are preserved. The major dural venous sinuses are enhancing and appear to be patent. Skull and upper cervical spine: Negative visible cervical spine. Normal bone marrow signal. Sinuses/Orbits: Negative orbits. Bilateral paranasal sinus mucosal thickening and opacification. Other: Trace right mastoid effusion. Negative nasopharynx. Visible internal auditory structures appear normal. Scalp  and face soft tissues appear negative. IMPRESSION: 1. Solitary 6 mm enhancing mass in the inferior left cerebellum compatible with a solitary brain metastasis. No edema or mass effect. 2. No other metastatic disease or acute intracranial abnormality identified. Mild to moderate for age nonspecific white matter signal changes, most commonly due to chronic small vessel disease. 3. Bilateral paranasal sinus disease. Electronically Signed   By: HGenevie AnnM.D.   On: 04/11/2020 19:23   DG CHEST PORT 1 VIEW  Result Date: 04/12/2020 CLINICAL DATA:  Lung mass, post bronchoscopy today EXAM: PORTABLE CHEST 1 VIEW COMPARISON:  Portable exam 1449 hours compared to 04/11/2020 FINDINGS: Normal heart size and pulmonary vascularity. Slight rotation to the RIGHT, which decreases the prominence of the previously identified RIGHT hilar mass. Scattered LEFT perihilar infiltrate, likely sequela of bronchoscopy and biopsy. No pleural effusion or pneumothorax. IMPRESSION: Mild LEFT perihilar infiltrate likely related to preceding bronchoscopy and biopsy. No pleural effusion or pneumothorax identified. Previously identified LEFT hilar mass less well demonstrated on current portable exam rotated to the RIGHT. Electronically Signed   By: MLavonia DanaM.D.   On: 04/12/2020 15:15   ECHOCARDIOGRAM COMPLETE  Result Date: 04/12/2020    ECHOCARDIOGRAM REPORT   Patient Name:   Richard WGID SCHOFFSTALLDate of Exam: 04/12/2020 Medical Rec #:  [id number]     Height:       72.0 in Accession #:    [id number]    Weight:       160.5 lb Date of Birth:  [birth year]     BSA:          1.940 m Patient Age:    89years       BP:           119/78 mmHg Patient Gender: M              HR:           79 bpm. Exam Location:  Inpatient Procedure: 2D Echo Indications:    Syncope 780.2 / R55  History:        Patient has no prior history of Echocardiogram examinations.                 Signs/Symptoms:Syncope; Risk Factors:Tobacco abuse, Hypertension  and  Dyslipidemia. Alcohol abuse.  Sonographer:    Vikki Ports Turrentine Referring Phys: [id number] [place]  1. Left ventricular ejection fraction, by estimation, is 50 to 55%. The left ventricle has low normal function. The left ventricle has no regional wall motion abnormalities. Left ventricular diastolic parameters were normal.  2. Right ventricular systolic function is normal. The right ventricular size is normal. Tricuspid regurgitation signal is inadequate for assessing PA pressure.  3. The mitral valve is normal in structure. No evidence of mitral valve regurgitation. No evidence of mitral stenosis.  4. The aortic valve is tricuspid. Aortic valve regurgitation is not visualized. No aortic stenosis is present.  5. The inferior vena cava is normal in size with greater than 50% respiratory variability, suggesting right atrial pressure of 3 mmHg. FINDINGS  Left Ventricle: Left ventricular ejection fraction, by estimation, is 50 to 55%. The left ventricle has low normal function. The left ventricle has no regional wall motion abnormalities. The left ventricular internal cavity size was normal in size. There is no left ventricular hypertrophy. Left ventricular diastolic parameters were normal. Right Ventricle: The right ventricular size is normal. No increase in right ventricular wall thickness. Right ventricular systolic function is normal. Tricuspid regurgitation signal is inadequate for assessing PA pressure. Left Atrium: Left atrial size was normal in size. Right Atrium: Right atrial size was normal in size. Pericardium: There is no evidence of pericardial effusion. Mitral Valve: The mitral valve is normal in structure. Normal mobility of the mitral valve leaflets. No evidence of mitral valve regurgitation. No evidence of mitral valve stenosis. Tricuspid Valve: The tricuspid valve is normal in structure. Tricuspid valve regurgitation is not demonstrated. No evidence of tricuspid stenosis. Aortic Valve:  The aortic valve is tricuspid. Aortic valve regurgitation is not visualized. No aortic stenosis is present. Pulmonic Valve: The pulmonic valve was normal in structure. Pulmonic valve regurgitation is trivial. No evidence of pulmonic stenosis. Aorta: The aortic root is normal in size and structure. Venous: The inferior vena cava is normal in size with greater than 50% respiratory variability, suggesting right atrial pressure of 3 mmHg. IAS/Shunts: No atrial level shunt detected by color flow Doppler.  LEFT VENTRICLE PLAX 2D LVIDd:         5.00 cm  Diastology LVIDs:         3.70 cm  LV e' lateral:   14.00 cm/s LV PW:         0.70 cm  LV E/e' lateral: 4.3 LV IVS:        0.70 cm  LV e' medial:    9.25 cm/s LVOT diam:     2.10 cm  LV E/e' medial:  6.5 LV SV:         69 LV SV Index:   36 LVOT Area:     3.46 cm  RIGHT VENTRICLE RV S prime:     11.20 cm/s TAPSE (M-mode): 2.2 cm LEFT ATRIUM             Index LA diam:        3.60 cm 1.86 cm/m LA Vol (A2C):   64.4 ml 33.19 ml/m LA Vol (A4C):   43.9 ml 22.62 ml/m LA Biplane Vol: 53.3 ml 27.47 ml/m  AORTIC VALVE LVOT Vmax:   98.70 cm/s LVOT Vmean:  70.700 cm/s LVOT VTI:    0.199 m  AORTA Ao Root diam: 3.40 cm MITRAL VALVE MV Area (PHT): 3.65 cm    SHUNTS MV Decel Time: 208 msec  Systemic VTI:  0.20 m MV E velocity: 60.00 cm/s  Systemic Diam: 2.10 cm MV A velocity: 64.30 cm/s MV E/A ratio:  0.93 Cherlynn Kaiser MD Electronically signed by Cherlynn Kaiser MD Signature Date/Time: 04/12/2020/12:50:40 PM    Final        IMPRESSION/PLAN: 1. Extensive stage small cell lung cancer arising in the left lung with advanced disease locally and brain metastasis. Dr. Lisbeth Renshaw discusses the pathology findings and reviews the nature of locally advanced disease. We discussed the risks, benefits, short, and long term effects of radiotherapy, and the patient is interested in proceeding. Dr. Lisbeth Renshaw discusses the delivery and logistics of radiotherapy and anticipates a course of 3 weeks of  radiotherapy given the timing of his systemic therapy. He will come in to the office later today for simulation so he can start therapy on Thursday this week. At that time he will sign written consent to proceed. 2. Brain metastasis. The patient will proceed with his systemic therapy. There is some level of penetrance that his regimen may give, and Dr. Julien Nordmann would like to try systemic therapy prior to radiotherapy. We did discuss that if he has a good improvement overall, Dr. Lisbeth Renshaw would consider whole brain radiotherapy to reduce risks of recurrence. We will follow along with his course in a few months. 3. Financial concerns.  We have reached out to our financial navigator in our department to contact the patient and discussed what his financial obligations may be during treatment.  We have also set up a referral to social work.  Given current concerns for patient exposure during the COVID-19 pandemic, this encounter was conducted via telephone.  The patient has provided two factor identification and has given verbal consent for this type of encounter and has been advised to only accept a meeting of this type in a secure network environment. The time spent during this encounter was 45 minutes including preparation, discussion, and coordination of the patient's care. The attendants for this meeting include Blenda Nicely, RN, Dr. Lisbeth Renshaw, Hayden Pedro  and [name].  During the encounter,  Blenda Nicely, RN, Dr. Lisbeth Renshaw, and Hayden Pedro were located at [facility] Radiation Oncology Department.  Richard Santos was located at home.     The above documentation reflects my direct findings during this shared patient visit. Please see the separate note by Dr. Lisbeth Renshaw on this date for the remainder of the patient's plan of care.    Carola Rhine, PAC

## 2020-04-23 NOTE — Telephone Encounter (Signed)
PET scan authorized for  cpt code [phone] ref # [id number].

## 2020-04-24 ENCOUNTER — Encounter: Payer: Self-pay | Admitting: General Practice

## 2020-04-24 DIAGNOSIS — Z5112 Encounter for antineoplastic immunotherapy: Secondary | ICD-10-CM | POA: Diagnosis not present

## 2020-04-24 NOTE — Progress Notes (Addendum)
[place] Initial Psychosocial Assessment Clinical Social Work  Clinical Social Work contacted by phone to assess psychosocial, emotional, mental health, and spiritual needs of the patient.   Barriers to care/review of distress screen:  - Transportation:  Do you anticipate any problems getting to appointments?  Do you have someone who can help run errands for you if you need it?  Wife drives as needed.   - Help at home:  What is your living situation (alone, family, other)?  If you are physically unable to care for yourself, who would you call on to help you?  Lives w wife who is on disability.  Wife can drive him and can help some at home.  Wife has frequent headaches and similar.   - Support system:  What does your support system look like?  Who would you call on if you needed some kind of practical help?  What if you needed someone to talk to for emotional support?  Has family including stepdaughter (lives 5 miles away), sister (lives in [place] and retired).  Coworkers and former customers are also expressing desire to help as needed.   - Finances:  Are you concerned about finances.  Considering returning to work?  If not, applying for disability?  He is self employed, wife is on permanent disability.  "Right now I cannot make money and we have to figure out if we can get some kind of income coming in to keep [place] afloat."  Does not have short or long term disability though his employer.  Has limited savings available.    What is your understanding of where you are with your cancer? Its cause?  Your treatment plan and what happens next?  Became dizzy/tight chested at work, "had to sit down", felt very unwell.  Eventually blacked out at work, ws thought to be heart attack.  Was BIB EMS to ED - scans revealed lung mass.  Lung mass caused blood vessel obstruction which cut blood flow to brain.  "Since then it's been like a nightmare."  Had prostate cancer several years ago - was aware PSA levels were  increasing and was able to get radiation seed therapy  "everything went very well, hardly missed a day of work as a result of that."  Now has no signs of prostate cancer - current diagnosis of lung cancer is a "whole different story."  Oncologist has given patient a year to live w treatment, 90 days without treatment.  But "we are keeping our head high and hoping for the best."  What are your worries for the future as you begin treatment for cancer?  Cannot work during the 6 months of cancer treatment.  Will have no income during that time.  Used payment plan during prior treatment for prostate cancer.  Is very concerned about affording treatment.  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  Business has already been impacted by COVID pandemic, was just starting to recover post pandemic.  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  Wants to be able to support granddaughters who have also recently lost their father at a young age.    What are your hopes and priorities during your treatment? What is important to you? What are your goals for your care?  Has trip planned for memorial service for son in law in July 2021, wants to attend to support daughter and  granddaughters.  Concerned about them because they recently lost their father unexpectedly in Feb 2021.    CSW Summary:  Patient and family psychosocial functioning including strengths, limitations, and coping skills:  59 yo married male, newly diagnosed w Stage IV lung cancer.  Self employed as Primary school teacher.  Wife on disability for many years, no income coming in when he is unable to work.  Recent death of son in law.  Some support from stepdaughter in [place] as well as sister who lives in [place].  He hopes to be able to return to work post treatment, but is currently interested in applying for Social Security disability benefits.  CSW and patient discussed common feeling  and emotions when being diagnosed with cancer, and the importance of support during treatment. CSW informed patient of the support team and support services at [facility]. CSW provided contact information and encouraged patient to call with any questions or concerns.  Identifications of barriers to care:  No income.  Wife is disabled but can help w transportation and support at home.    Availability of community resources:  [employer] for disability assistance, Triage Cancer for insurance/financial, Advertising account executive, [facility] Cancer [employer] gas card program, [place]; referrals sent to [employer] program and [employer] by secure email, email to patient w resources provided  Clinical Social Worker follow up needed: Yes.  , call in two weeks to check in  Edwyna Shell, [place] Worker Phone:  [phone] Cell:  [phone]

## 2020-04-25 ENCOUNTER — Ambulatory Visit
Admission: RE | Admit: 2020-04-25 | Discharge: 2020-04-25 | Disposition: A | Discharge status: Home / Self Care | Payer: 59 | Source: Ambulatory Visit | Attending: Radiation Oncology | Admitting: Radiation Oncology

## 2020-04-25 ENCOUNTER — Other Ambulatory Visit: Payer: Self-pay

## 2020-04-25 DIAGNOSIS — Z5112 Encounter for antineoplastic immunotherapy: Secondary | ICD-10-CM | POA: Diagnosis not present

## 2020-04-25 NOTE — Progress Notes (Signed)

## 2020-04-26 ENCOUNTER — Ambulatory Visit (INDEPENDENT_AMBULATORY_CARE_PROVIDER_SITE_OTHER): Payer: 59 | Admitting: Pulmonary Disease

## 2020-04-26 ENCOUNTER — Encounter: Payer: Self-pay | Admitting: Pulmonary Disease

## 2020-04-26 ENCOUNTER — Ambulatory Visit
Admission: RE | Admit: 2020-04-26 | Discharge: 2020-04-26 | Disposition: A | Discharge status: Home / Self Care | Payer: 59 | Source: Ambulatory Visit | Attending: Radiation Oncology | Admitting: Radiation Oncology

## 2020-04-26 ENCOUNTER — Encounter: Payer: Self-pay | Admitting: *Deleted

## 2020-04-26 ENCOUNTER — Other Ambulatory Visit: Payer: Self-pay

## 2020-04-26 VITALS — BP 102/62 | HR 96 | Temp 98.9°F | Ht 72.0 in | Wt 160.6 lb

## 2020-04-26 DIAGNOSIS — C3412 Malignant neoplasm of upper lobe, left bronchus or lung: Secondary | ICD-10-CM

## 2020-04-26 DIAGNOSIS — Z720 Tobacco use: Secondary | ICD-10-CM | POA: Diagnosis not present

## 2020-04-26 DIAGNOSIS — Z5112 Encounter for antineoplastic immunotherapy: Secondary | ICD-10-CM | POA: Diagnosis not present

## 2020-04-26 DIAGNOSIS — J439 Emphysema, unspecified: Secondary | ICD-10-CM | POA: Diagnosis not present

## 2020-04-26 MED ORDER — NICOTINE 14 MG/24HR TD PT24: 14.0000 mg | MEDICATED_PATCH | Freq: Every day | TRANSDERMAL | 3 refills | Status: DC

## 2020-04-26 MED ORDER — NICOTINE POLACRILEX 4 MG MT LOZG: 4.0000 mg | LOZENGE | OROMUCOSAL | 6 refills | Status: DC | PRN

## 2020-04-26 MED ORDER — SONAFINE EX EMUL
1.0000 "application " | Freq: Once | CUTANEOUS | Status: AC
  Administered 2020-04-26: 1 via TOPICAL

## 2020-04-26 NOTE — Assessment & Plan Note (Signed)
Plan: Pulmonary function testing ordered Offered inhaler trial of LABA/LAMA, patient declined at this time Emphasized importance of stopping smoking Deferred walk today due to upcoming appointment with radiation oncology, can complete at next visit

## 2020-04-26 NOTE — Assessment & Plan Note (Signed)
Plan: Continue follow-up with oncology Continue follow-up with radiation oncology Continue to work on stopping smoking We will order pulmonary function testing Follow-up with our office in 2 to 3 months

## 2020-04-26 NOTE — Progress Notes (Signed)
I followed up on Richard Santos schedule. His PET and treatment plan are schedule as of today.

## 2020-04-26 NOTE — Patient Instructions (Addendum)
You were seen today by Richard Rinne, NP  for:   1. Small cell lung cancer, left upper lobe (HCC)  - Pulmonary function test; Future  Continue follow-up with oncology Dr. Earlie Server  Continue follow-up with radiation oncology  We will see you back in 2 to 3 months with a pulmonary function test  2. Tobacco abuse  - Pulmonary function test; Future - nicotine (NICODERM CQ) 14 mg/24hr patch; Place 1 patch (14 mg total) onto the skin daily.  Dispense: 28 patch; Refill: 3 - nicotine polacrilex (COMMIT) 4 MG lozenge; Take 1 lozenge (4 mg total) by mouth as needed for smoking cessation.  Dispense: 108 tablet; Refill: 6  We recommend that you stop smoking.  >>>You need to set a quit date >>>If you have friends or family who smoke, let them know you are trying to quit and not to smoke around you or in your living environment  Smoking Cessation Resources:  1 800 QUIT NOW  >>> Patient to call this resource and utilize it to help support her quit smoking >>> Keep up your hard work with stopping smoking  You can also contact the [facility] >>>For smoking cessation classes call [phone]  We do not recommend using e-cigarettes as a form of stopping smoking  You can sign up for smoking cessation support texts and information:  >>>https://smokefree.gov/smokefreetxt   Nicotine patches: >>>Make sure you rotate sites that you do not get skin irritation, Apply 1 patch each morning to a non-hairy skin site  If you are smoking greater than 10 cigarettes/day and weigh over 45 kg start with the nicotine patch of 21 mg a day for 6 weeks, then 14 mg a day for 2 weeks, then finished with 7 mg a day for 2 weeks, then stop  If you are smoking less than 10 cigarettes a day or weight less than 45 kg start with medium dose pack of 14 mg a day for 6 weeks, followed by 7 mg a day for 2 weeks   >>>If insomnia occurs you are having trouble sleeping you can take the patch off at night, and  place a new one on in the morning >>>If the patch is removed at night and you have morning cravings start short acting nicotine replacement therapy such as gum or lozenges   Nicotine lozenge: Lozenges are commonly uses short acting NRT product  >>>Smokers who smoke within 30 minutes of awakening should use 4 mg dose >>>Smokers who wait more than 30 minutes after awakening to smoke should use 2 mg dose  Can use up to 1 lozenge every 1-2 hours for 6 weeks >>>Total amount of lozenges that can be used per day as 20 >>>Gradually reduce number of lozenges used per day after 2 weeks of use  Place lozenge in mouth and allowed to dissolve for 30 minutes loss and does not need to be chewed  Lozenges have advantages to be able to be used in people with TMG, poor dentition, dentures   We recommend today:  Orders Placed This Encounter  Procedures  . Pulmonary function test    Standing Status:   Future    Standing Expiration Date:   04/26/2021    Scheduling Instructions:     In 3 mo    Order Specific Question:   Where should this test be performed?    Answer:   [facility]    Order Specific Question:   Full PFT: includes the following: basic spirometry, spirometry pre &  post bronchodilator, diffusion capacity (DLCO), lung volumes    Answer:   Full PFT   Orders Placed This Encounter  Procedures  . Pulmonary function test   Meds ordered this encounter  Medications  . nicotine (NICODERM CQ) 14 mg/24hr patch    Sig: Place 1 patch (14 mg total) onto the skin daily.    Dispense:  28 patch    Refill:  3  . nicotine polacrilex (COMMIT) 4 MG lozenge    Sig: Take 1 lozenge (4 mg total) by mouth as needed for smoking cessation.    Dispense:  108 tablet    Refill:  6    Follow Up:    Return in about 3 months (around 07/27/2020), or if symptoms worsen or fail to improve, for Follow up with Dr. Vaughan Browner, Follow up for FULL PFT - 60 min.   Please do your part to reduce the spread of  COVID-19:      Reduce your risk of any infection  and COVID19 by using the similar precautions used for avoiding the common cold or flu:  Marland Kitchen Wash your hands often with soap and warm water for at least 20 seconds.  If soap and water are not readily available, use an alcohol-based hand sanitizer with at least 60% alcohol.  . If coughing or sneezing, cover your mouth and nose by coughing or sneezing into the elbow areas of your shirt or coat, into a tissue or into your sleeve (not your hands). Langley Gauss A MASK when in public  . Avoid shaking hands with others and consider head nods or verbal greetings only. . Avoid touching your eyes, nose, or mouth with unwashed hands.  . Avoid close contact with people who are sick. . Avoid places or events with large numbers of people in one location, like concerts or sporting events. . If you have some symptoms but not all symptoms, continue to monitor at home and seek medical attention if your symptoms worsen. . If you are having a medical emergency, call 911.   [place] / e-Visit: [email or web address]         [facility]: [facility]: [phone]                   [facility]: [phone]     It is flu season:   >>> Best ways to protect herself from the flu: Receive the yearly flu vaccine, practice good hand hygiene washing with soap and also using hand sanitizer when available, eat a nutritious meals, get adequate rest, hydrate appropriately   Please contact the office if your symptoms worsen or you have concerns that you are not improving.   Thank you for choosing [facility] for your healthcare, and for allowing [place] to partner with you on your healthcare journey. I am thankful to be able to provide care to you today.   Wyn Quaker FNP-C

## 2020-04-26 NOTE — Progress Notes (Signed)
@Patient  ID: Richard Santos, male    DOB: [birth year], 59 y.o.   MRN: [id number]  Chief Complaint  Patient presents with  . Follow-up    s/p bronch, former smoker     Referring provider: [name], MD  HPI:  59 year old male current everyday smoker followed in our office by Dr. Vaughan Browner.  Initially consulted with our office on 04/11/2020.  Status post bronchoscopy by Dr. Vaughan Browner on 04/12/2020.  Found to have extensive stage small cell lung cancer.  PMH: Hyperlipidemia, anxiety, GERD, dyspepsia, malignant neoplasm of prostate, hypertension Smoker/ Smoking History: Current Smoker.  5 cigarettes a day.  25-pack-year smoking history. Maintenance: None Pt of: Dr. Vaughan Browner  04/26/2020  - Visit   59 year old male current everyday smoker being followed in our office by Dr. Vaughan Browner.  Patient is status post bronchoscopy on 04/12/2020 by Dr. Vaughan Browner.  Patient was diagnosed with small cell lung cancer of the left upper lobe.  He is followed by Dr. Earlie Server.  Last office visit with oncology was on 04/18/2020.  An excerpt of that office visit plan of care as listed below:  ASSESSMENT: This is a very pleasant 59 years old white male recently diagnosed with extensive stage (T2b, N2, M1c) small cell lung cancer presented with left upper lobe pulmonary nodule in addition to left hilar mass with mediastinal invasion and solitary brain metastasis diagnosed in May 2021  PLAN: I had a lengthy discussion with the patient and his wife about his current disease stage, prognosis and treatment options. I personally and independently reviewed the scan images and discussed the result and showed the images to the patient and his wife. I recommended for the patient to complete the staging work-up by ordering a PET scan for further evaluation of his disease and to rule out any other metastatic lesions. I explained to the patient that he has incurable condition and all the treatment will be of palliative nature. I discussed  with the patient his treatment options including palliative care and hospice referral versus consideration of palliative systemic chemotherapy with carboplatin for AUC of 5 on day 1, etoposide 100 mg/M2 on days 1, 2 and 3 as well as Imfinzi 1500 mg IV every 3 weeks with the chemotherapy then every 4 weeks as maintenance after cycle #4 if the patient has no evidence for disease progression.  I would also consider the patient for treatment with Cosela as a myeloprotective agent during his chemotherapy. I discussed with the patient the adverse effect of this treatment including but not limited to alopecia, myelosuppression, nausea and vomiting, peripheral neuropathy, liver or renal dysfunction as well as immunotherapy adverse effects. 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  This event is very important for him and he would not like to miss it. I will arrange for the patient to start the first cycle of his treatment on May 06, 2020 and cycle #2 on June 03, 2020. In the meantime because of the delay in starting his chemotherapy to fit his schedule, I will refer the patient to radiation oncology for consideration of short course of palliative radiotherapy to the obstructive left hilar/mediastinal mass. The brain metastasis is solitary and measure only 0.6 cm and its likely to response to chemotherapy but we will continue to monitor it closely on upcoming imaging studies and if no improvement at the patient would benefit from  radiotherapy to the brain. The patient and his wife agreed to the current plan. He will have a chemotherapy education class before the first dose of his treatment. I will arrange for the patient to come back for follow-up visit on the first day of his chemotherapy. I will send a prescription of Compazine 10 mg p.o. every 6 hours as needed for nausea to his pharmacy. I strongly  encouraged the patient to continue smoking cessation and also to decrease the amount of alcohol drinking on daily basis. He was advised to call immediately if he has any concerning symptoms in the interval.   Patient was also seen by Dr. Lisbeth Renshaw on 04/23/2020.  Excerpt of that plan of care as listed below:  IMPRESSION/PLAN: 1.         Extensive stage small cell lung cancer arising in the left lung with advanced disease locally and brain metastasis. Dr. Lisbeth Renshaw discusses the pathology findings and reviews the nature of locally advanced disease. We discussed the risks, benefits, short, and long term effects of radiotherapy, and the patient is interested in proceeding. Dr. Lisbeth Renshaw discusses the delivery and logistics of radiotherapy and anticipates a course of 3 weeks of radiotherapy given the timing of his systemic therapy. He will come in to the office later today for simulation so he can start therapy on Thursday this week. At that time he will sign written consent to proceed. 2.         Brain metastasis. The patient will proceed with his systemic therapy. There is some level of penetrance that his regimen may give, and Dr. Julien Nordmann would like to try systemic therapy prior to radiotherapy. We did discuss that if he has a good improvement overall, Dr. Lisbeth Renshaw would consider whole brain radiotherapy to reduce risks of recurrence. We will follow along with his course in a few months. 3.         Financial concerns.  We have reached out to our financial navigator in our department to contact the patient and discussed what his financial obligations may be during treatment.  We have also set up a referral to social work.  Patient was initially consulted with our practice on 04/11/2020.  He has a history of prostate cancer with treatment by local radiation 5 years ago.  30-pack-year smoking history.  CTA chest obtained on admission showed a 4.7 x 4.5 x 4.8 cm left hilar mass that invades the mediastinum and left hilum  mass.  Patient was discharged on 04/12/2020.  Excerpt of the discharge summary listed below:  1-Lung Mass; chest showed a left hilar mass which invades the mediastinum, left hilum, and encases the left upper lobe pulmonary artery concerning for primary bronchogenic carcinoma. Underwent Bronchoscopy. Biopsy results pending.  Oncology consulted. Plan to follow up with Dr Julien Nordmann out patient. Office will call to arrange follow up/  MRI of the brain show 6 mm left cerebellar metastatic focus  2-Syncope; Suspect vasovagal Oxygen on ambulation Echo: Normal ejection fraction, no diastolic dysfunction   3-Anxiety ;Continue with BuSpar 4-Hyperlipidemia:Continue with atorvastatin 5-Tobacco: Counseling  Alcohol abuse:He was monitor on  CIWA protocol. No evidence of withdrawal.   History of prostate cancer status post radioactive seed placement HTN; resume home medication at discharge.   Patient presenting today with spouse.  Reporting that he is doing well since hospital discharge and bronchoscopy.  He is working on decreasing his smoking.  He is decreased his smoking down to 5 cigarettes a day.  He is currently using a  21 mg nicotine patch.  He is interested in transitioning to a 14 mg nicotine patch.  We will discuss this today.  He is preparing to get another radiation treatment later on today after this appointment.  He continues to have follow-up with oncology.  2011 pulmonary function test showed normal diffusion capacity as well as no formal obstruction.  Patient is not on any maintenance inhalers.  Questionaires / Pulmonary Flowsheets:   ACT:  No flowsheet data found.  MMRC: mMRC Dyspnea Scale mMRC Score  04/26/2020 2    Epworth:  No flowsheet data found.  Tests:   04/11/2020-CTA chest-no evidence of acute PE, left hilar/AP window mass is identified which invades the mediastinum and left hilum, highly concerning for primary bronchogenic carcinoma 4.7 x 4.5 x 4.8 cm  mass.  Patchy areas of groundglass attenuation, emphysema, coronary artery calcifications  04/11/2020-MRI brain-solitary 6 mm enhancing mass in the inferior left cerebellum compatible with solitary brain metastasis, no other metastatic disease or acute intracranial abnormality, mild to moderate for age nonspecific white matter signal changes, bilateral paranasal sinus disease  01/29/2010-spirometry-FVC 4.72 (91% addicted), ratio 74, FEV1 3.49 (91% predicted), DLCO 23.7 (93% predicted)  FENO:  No results found for: NITRICOXIDE  PFT: No flowsheet data found.  WALK:  No flowsheet data found.  Imaging: DG Chest 2 View  Result Date: 04/11/2020 CLINICAL DATA:  Syncopal episode for 2 minutes, intermittent LEFT chest pain over last week or 2 radiating to LEFT axilla, pale and diaphoretic, history hypertension, smoker, prostate cancer EXAM: CHEST - 2 VIEW COMPARISON:  12/21/2014, 04/03/2020 FINDINGS: Normal heart size and pulmonary vascularity. Abnormal enlargement of LEFT pulmonary hilum. Emphysematous and bronchitic changes consistent with COPD. Asymmetric apical scarring greater on LEFT. No acute infiltrate, pleural effusion or pneumothorax. Probable BILATERAL nipple shadows. Bones demineralized. IMPRESSION: COPD changes with enlargement of LEFT pulmonary hilum, cannot exclude perihilar mass or hilar adenopathy; CT chest with contrast recommended for further assessment. Remainder of exam unremarkable. Findings called to Dr. Johnney Killian on 04/11/2020 at 1038 hours. Electronically Signed   By: Lavonia Dana M.D.   On: 04/11/2020 10:37   DG Chest 2 View  Result Date: 04/04/2020 CLINICAL DATA:  Chest pain EXAM: CHEST - 2 VIEW COMPARISON:  December 21, 2014 FINDINGS: There is slight scarring in the apices. Lungs elsewhere are clear. Heart size and pulmonary vascularity are normal. No adenopathy. There is degenerative change in the midthoracic spine. IMPRESSION: Slight apical scarring. No edema or airspace opacity.  Cardiac silhouette within normal limits. No adenopathy. Electronically Signed   By: Lowella Grip III M.D.   On: 04/04/2020 08:53   CT Angio Chest PE W/Cm &/Or Wo Cm  Result Date: 04/11/2020 CLINICAL DATA:  Chest pain and SOB. Suspected lung mass. EXAM: CT ANGIOGRAPHY CHEST WITH CONTRAST TECHNIQUE: Multidetector CT imaging of the chest was performed using the standard protocol during bolus administration of intravenous contrast. Multiplanar CT image reconstructions and MIPs were obtained to evaluate the vascular anatomy. CONTRAST:  13mL OMNIPAQUE IOHEXOL 350 MG/ML SOLN COMPARISON:  Chest radiograph from 04/11/2020 FINDINGS: Cardiovascular: Satisfactory opacification of the pulmonary arteries to the segmental level. No evidence of pulmonary embolism. Normal heart size. No pericardial effusion. Aortic atherosclerosis. Lad, left circumflex and RCA coronary artery calcifications. Mediastinum/Nodes: Normal appearance of the thyroid gland. The trachea appears patent and is midline. Normal appearance of the esophagus. Left hilar/AP window mass measures 4.7 x 4.5 by 4.8 cm. This invades the mediastinum and left hilum. There is encasement of the left upper  lobe pulmonary arteries with significant luminal narrowing. Encasement of the distal left mainstem bronchus and its branches are also identified, image 81/9. Adjacent left pre-vascular lymph node is borderline enlarged measuring 1.3 cm, image 61/6. No subcarinal, right paratracheal or right hilar adenopathy. No supraclavicular or axillary adenopathy. Lungs/Pleura: No pleural effusion. Centrilobular and paraseptal emphysema identified. Biapical pleuroparenchymal scarring noted. Within the anteromedial left upper lobe there is a subpleural nodular density which is nonspecific measuring 1.5 x 0.9 cm, image 77/7. Patchy areas of ground-glass attenuation are noted within the upper lobes. Index ground-glass density within the left upper lobe measures 2.0 x 1.1 cm, image  48/7. Upper Abdomen: No acute findings within the imaged portions of the upper abdomen. Musculoskeletal: Thoracolumbar scoliosis. No suspicious or acute bone lesions. Review of the MIP images confirms the above findings. IMPRESSION: 1. No evidence for acute pulmonary embolus. 2. Left hilar/AP window mass is identified which invades the mediastinum and left hilum. Highly concerning for primary bronchogenic carcinoma. Encasement of the left upper lobe pulmonary arteries and its branches is noted with significant luminal narrowing. Also noted is a borderline enlarged left pre-vascular lymph node, which likely represents a metastatic lymph node. Recommend referral to pulmonary medicine/thoracic surgery multi disciplinary thoracic oncology group for further workup and management. 3. There is a nonspecific subpleural nodular density in the anteromedial left upper lobe measuring 1.5 cm. This could be further addressed at PET-CT. 4. Patchy areas of ground-glass attenuation are noted within the upper lobes. These are nonspecific and without previous imaging for comparison may be inflammatory or infectious in etiology. 5. Emphysema and aortic atherosclerosis. Coronary artery calcifications. Aortic Atherosclerosis (ICD10-I70.0) and Emphysema (ICD10-J43.9). Electronically Signed   By: Kerby Moors M.D.   On: 04/11/2020 13:22   MR BRAIN W WO CONTRAST  Result Date: 04/11/2020 CLINICAL DATA:  59 year old male with recently diagnosed left lung mass. Staging. EXAM: MRI HEAD WITHOUT AND WITH CONTRAST TECHNIQUE: Multiplanar, multiecho pulse sequences of the brain and surrounding structures were obtained without and with intravenous contrast. CONTRAST:  7.79mL GADAVIST GADOBUTROL 1 MMOL/ML IV SOLN COMPARISON:  CTA chest earlier today. FINDINGS: Brain: 6 mm small enhancing mass in the inferior left cerebellum (series 10, image 6). No significant cerebellar edema. No mass effect. This does have mildly Abner diffusion on the basis of  hypercellularity. No other abnormal intracranial enhancement identified. Small right cingulate gyrus developmental venous anomaly (DVA series 11, image 15, normal variant). No dural thickening identified. No midline shift or intracranial mass effect. No restricted diffusion suggestive of acute infarction. No ventriculomegaly, extra-axial collection or acute intracranial hemorrhage. Cervicomedullary junction and pituitary are within normal limits. No cortical encephalomalacia or chronic cerebral blood products identified. Mild to moderate for age nonspecific white matter signal changes. Vascular: Major intracranial vascular flow voids are preserved. The major dural venous sinuses are enhancing and appear to be patent. Skull and upper cervical spine: Negative visible cervical spine. Normal bone marrow signal. Sinuses/Orbits: Negative orbits. Bilateral paranasal sinus mucosal thickening and opacification. Other: Trace right mastoid effusion. Negative nasopharynx. Visible internal auditory structures appear normal. Scalp and face soft tissues appear negative. IMPRESSION: 1. Solitary 6 mm enhancing mass in the inferior left cerebellum compatible with a solitary brain metastasis. No edema or mass effect. 2. No other metastatic disease or acute intracranial abnormality identified. Mild to moderate for age nonspecific white matter signal changes, most commonly due to chronic small vessel disease. 3. Bilateral paranasal sinus disease. Electronically Signed   By: Herminio Heads.D.  On: 04/11/2020 19:23   DG CHEST PORT 1 VIEW  Result Date: 04/12/2020 CLINICAL DATA:  Lung mass, post bronchoscopy today EXAM: PORTABLE CHEST 1 VIEW COMPARISON:  Portable exam 1449 hours compared to 04/11/2020 FINDINGS: Normal heart size and pulmonary vascularity. Slight rotation to the RIGHT, which decreases the prominence of the previously identified RIGHT hilar mass. Scattered LEFT perihilar infiltrate, likely sequela of bronchoscopy and biopsy.  No pleural effusion or pneumothorax. IMPRESSION: Mild LEFT perihilar infiltrate likely related to preceding bronchoscopy and biopsy. No pleural effusion or pneumothorax identified. Previously identified LEFT hilar mass less well demonstrated on current portable exam rotated to the RIGHT. Electronically Signed   By: Lavonia Dana M.D.   On: 04/12/2020 15:15   ECHOCARDIOGRAM COMPLETE  Result Date: 04/12/2020    ECHOCARDIOGRAM REPORT   Patient Name:   Viraj DONTREZ PETTIS Date of Exam: 04/12/2020 Medical Rec #:  [id number]      Height:       72.0 in Accession #:    [id number]     Weight:       160.5 lb Date of Birth:  01/08/1961      BSA:          1.940 m Patient Age:    72 years       BP:           119/78 mmHg Patient Gender: M              HR:           79 bpm. Exam Location:  Inpatient Procedure: 2D Echo Indications:    Syncope 780.2 / R55  History:        Patient has no prior history of Echocardiogram examinations.                 Signs/Symptoms:Syncope; Risk Factors:Tobacco abuse, Hypertension                 and Dyslipidemia. Alcohol abuse.  Sonographer:    Vikki Ports Turrentine Referring Phys: [id number] [place]  1. Left ventricular ejection fraction, by estimation, is 50 to 55%. The left ventricle has low normal function. The left ventricle has no regional wall motion abnormalities. Left ventricular diastolic parameters were normal.  2. Right ventricular systolic function is normal. The right ventricular size is normal. Tricuspid regurgitation signal is inadequate for assessing PA pressure.  3. The mitral valve is normal in structure. No evidence of mitral valve regurgitation. No evidence of mitral stenosis.  4. The aortic valve is tricuspid. Aortic valve regurgitation is not visualized. No aortic stenosis is present.  5. The inferior vena cava is normal in size with greater than 50% respiratory variability, suggesting right atrial pressure of 3 mmHg. FINDINGS  Left Ventricle: Left ventricular ejection  fraction, by estimation, is 50 to 55%. The left ventricle has low normal function. The left ventricle has no regional wall motion abnormalities. The left ventricular internal cavity size was normal in size. There is no left ventricular hypertrophy. Left ventricular diastolic parameters were normal. Right Ventricle: The right ventricular size is normal. No increase in right ventricular wall thickness. Right ventricular systolic function is normal. Tricuspid regurgitation signal is inadequate for assessing PA pressure. Left Atrium: Left atrial size was normal in size. Right Atrium: Right atrial size was normal in size. Pericardium: There is no evidence of pericardial effusion. Mitral Valve: The mitral valve is normal in structure. Normal mobility of the mitral valve leaflets. No evidence of mitral valve regurgitation.  No evidence of mitral valve stenosis. Tricuspid Valve: The tricuspid valve is normal in structure. Tricuspid valve regurgitation is not demonstrated. No evidence of tricuspid stenosis. Aortic Valve: The aortic valve is tricuspid. Aortic valve regurgitation is not visualized. No aortic stenosis is present. Pulmonic Valve: The pulmonic valve was normal in structure. Pulmonic valve regurgitation is trivial. No evidence of pulmonic stenosis. Aorta: The aortic root is normal in size and structure. Venous: The inferior vena cava is normal in size with greater than 50% respiratory variability, suggesting right atrial pressure of 3 mmHg. IAS/Shunts: No atrial level shunt detected by color flow Doppler.  LEFT VENTRICLE PLAX 2D LVIDd:         5.00 cm  Diastology LVIDs:         3.70 cm  LV e' lateral:   14.00 cm/s LV PW:         0.70 cm  LV E/e' lateral: 4.3 LV IVS:        0.70 cm  LV e' medial:    9.25 cm/s LVOT diam:     2.10 cm  LV E/e' medial:  6.5 LV SV:         69 LV SV Index:   36 LVOT Area:     3.46 cm  RIGHT VENTRICLE RV S prime:     11.20 cm/s TAPSE (M-mode): 2.2 cm LEFT ATRIUM             Index LA diam:         3.60 cm 1.86 cm/m LA Vol (A2C):   64.4 ml 33.19 ml/m LA Vol (A4C):   43.9 ml 22.62 ml/m LA Biplane Vol: 53.3 ml 27.47 ml/m  AORTIC VALVE LVOT Vmax:   98.70 cm/s LVOT Vmean:  70.700 cm/s LVOT VTI:    0.199 m  AORTA Ao Root diam: 3.40 cm MITRAL VALVE MV Area (PHT): 3.65 cm    SHUNTS MV Decel Time: 208 msec    Systemic VTI:  0.20 m MV E velocity: 60.00 cm/s  Systemic Diam: 2.10 cm MV A velocity: 64.30 cm/s MV E/A ratio:  0.93 Cherlynn Kaiser MD Electronically signed by Cherlynn Kaiser MD Signature Date/Time: 04/12/2020/12:50:40 PM    Final     Lab Results:  CBC    Component Value Date/Time   WBC 8.8 04/18/2020 1429   WBC 7.1 04/12/2020 0345   RBC 4.06 (L) 04/18/2020 1429   HGB 14.2 04/18/2020 1429   HCT 41.1 04/18/2020 1429   PLT 260 04/18/2020 1429   MCV 101.2 (H) 04/18/2020 1429   MCH 35.0 (H) 04/18/2020 1429   MCHC 34.5 04/18/2020 1429   RDW 12.4 04/18/2020 1429   LYMPHSABS 2.3 04/18/2020 1429   MONOABS 0.8 04/18/2020 1429   EOSABS 0.5 04/18/2020 1429   BASOSABS 0.1 04/18/2020 1429    BMET    Component Value Date/Time   NA 139 04/18/2020 1429   K 4.2 04/18/2020 1429   CL 102 04/18/2020 1429   CO2 26 04/18/2020 1429   GLUCOSE 113 (H) 04/18/2020 1429   BUN 13 04/18/2020 1429   CREATININE 0.92 04/18/2020 1429   CALCIUM 9.5 04/18/2020 1429   GFRNONAA >60 04/18/2020 1429   GFRAA >60 04/18/2020 1429    BNP No results found for: BNP  ProBNP No results found for: PROBNP  Specialty Problems      Pulmonary Problems   ALLERGIC RHINITIS    Qualifier: Diagnosis of  By: Jenny Reichmann MD, Hunt Oris       Lung mass  Small cell lung cancer, left upper lobe (HCC)   Emphysema of lung (HCC)      Allergies  Allergen Reactions  . Bee Venom Swelling    Immunization History  Administered Date(s) Administered  . Moderna SARS-COVID-2 Vaccination 01/26/2020, 02/27/2020  . Td 12/31/2010    Past Medical History:  Diagnosis Date  . Allergic rhinitis   . Allergy   .  Anxiety   . Chronic low back pain   . DDD (degenerative disc disease), lumbar   . ED (erectile dysfunction)   . Finger injury    cut pads off 3rd and 4th finger left hand/due to lawn mower accident  . GERD (gastroesophageal reflux disease)   . Heart murmur    as child only-   . History of kidney stones   . HTN (hypertension) 08/20/2016  . Hyperlipidemia   . Incomplete right bundle branch block   . Prostate cancer ([place]) dx 10/02/14   stage T1c  . Prostate cancer ([place])   . Sigmoid diverticulosis   . Wears glasses     Tobacco History: Social History   Tobacco Use  Smoking Status Current Every Day Smoker  . Packs/day: 1.00  . Years: 25.00  . Pack years: 25.00  . Types: Cigarettes  Smokeless Tobacco Never Used   Ready to quit: Not Answered Counseling given: Not Answered  Smoking assessment and cessation counseling  Patient currently smoking: 1-5 cigarettes a day  I have advised the patient to quit/stop smoking as soon as possible due to high risk for multiple medical problems.  It will also be very difficult for [place] to manage patient's  respiratory symptoms and status if we continue to expose her lungs to a known irritant.  We do not advise e-cigarettes as a form of stopping smoking.  Patient is willing to quit smoking.  I have advised the patient that we can assist and have options of nicotine replacement therapy, provided smoking cessation education today, provided smoking cessation counseling, and provided cessation resources.  Follow-up next office visit office visit for assessment of smoking cessation.    Smoking cessation counseling advised for: 4 min    Outpatient Encounter Medications as of 04/26/2020  Medication Sig  . acetaminophen (TYLENOL) 325 MG tablet Take 2 tablets (650 mg total) by mouth every 6 (six) hours as needed for mild pain (or Fever >/= 101).  Marland Kitchen atorvastatin (LIPITOR) 40 MG tablet Take 1 tablet (40 mg total) by mouth daily.  . busPIRone (BUSPAR) 10  MG tablet Take 1 tablet (10 mg total) by mouth 2 (two) times daily.  Marland Kitchen EPINEPHrine (EPIPEN) 0.3 mg/0.3 mL IJ SOAJ injection Inject 0.3 mLs (0.3 mg total) into the muscle once.  . folic acid (FOLVITE) 1 MG tablet Take 1 tablet (1 mg total) by mouth daily.  Marland Kitchen losartan (COZAAR) 50 MG tablet Take 1 tablet (50 mg total) by mouth daily.  . Multiple Vitamin (MULTIVITAMIN) capsule Take 1 capsule by mouth daily.  . nicotine (NICODERM CQ - DOSED IN MG/24 HOURS) 21 mg/24hr patch Place 1 patch (21 mg total) onto the skin daily.  . pantoprazole (PROTONIX) 40 MG tablet Take 1 tablet (40 mg total) by mouth daily before breakfast.  . prochlorperazine (COMPAZINE) 10 MG tablet Take 1 tablet (10 mg total) by mouth every 6 (six) hours as needed for nausea or vomiting.  . traMADol (ULTRAM) 50 MG tablet TAKE HALF TABLET BY MOUTH 2 TIMES DAILY AS NEEDED FOR PAIN  . nicotine (NICODERM CQ) 14 mg/24hr patch  Place 1 patch (14 mg total) onto the skin daily.  . nicotine polacrilex (COMMIT) 4 MG lozenge Take 1 lozenge (4 mg total) by mouth as needed for smoking cessation.  . [DISCONTINUED] tamsulosin (FLOMAX) 0.4 MG CAPS capsule Take 1 capsule (0.4 mg total) by mouth daily. (Patient not taking: Reported on 04/11/2020)   No facility-administered encounter medications on file as of 04/26/2020.     Review of Systems  Review of Systems  Constitutional: Positive for fatigue. Negative for activity change, diaphoresis and fever.  HENT: Negative for congestion, postnasal drip, sneezing, sore throat and trouble swallowing.   Eyes: Negative.   Respiratory: Positive for shortness of breath. Negative for cough and wheezing.   Cardiovascular: Negative for chest pain and palpitations.  Gastrointestinal: Negative for diarrhea, nausea and vomiting.  Endocrine: Negative.   Genitourinary: Negative.   Musculoskeletal: Negative.  Negative for arthralgias.  Skin: Negative.   Allergic/Immunologic: Negative.   Neurological: Positive for  dizziness (one episode since starting onc treatments).  Hematological: Negative.   Psychiatric/Behavioral: Negative.  Negative for dysphoric mood. The patient is not nervous/anxious.      Physical Exam  BP 102/62 (BP Location: Left Arm, Cuff Size: Normal)   Pulse 96   Temp 98.9 F (37.2 C) (Oral)   Ht 6' (1.829 m)   Wt 160 lb 9.6 oz (72.8 kg)   SpO2 99%   BMI 21.78 kg/m   Wt Readings from Last 5 Encounters:  04/26/20 160 lb 9.6 oz (72.8 kg)  04/23/20 159 lb (72.1 kg)  04/18/20 159 lb 6.4 oz (72.3 kg)  04/12/20 160 lb 7.9 oz (72.8 kg)  04/03/20 166 lb (75.3 kg)    BMI Readings from Last 5 Encounters:  04/26/20 21.78 kg/m  04/23/20 21.56 kg/m  04/18/20 21.62 kg/m  04/12/20 21.77 kg/m  04/03/20 22.51 kg/m     Physical Exam Vitals and nursing note reviewed.  Constitutional:      General: He is not in acute distress.    Appearance: Normal appearance. He is obese.  HENT:     Head: Normocephalic and atraumatic.     Right Ear: Hearing, tympanic membrane, ear canal and external ear normal.     Left Ear: Hearing, tympanic membrane, ear canal and external ear normal.     Nose: Nose normal. No mucosal edema or rhinorrhea.     Right Turbinates: Not enlarged.     Left Turbinates: Not enlarged.     Mouth/Throat:     Mouth: Mucous membranes are dry.     Pharynx: Oropharynx is clear. No oropharyngeal exudate.  Eyes:     Pupils: Pupils are equal, round, and reactive to light.  Cardiovascular:     Rate and Rhythm: Normal rate and regular rhythm.     Pulses: Normal pulses.     Heart sounds: Normal heart sounds. No murmur.  Pulmonary:     Effort: Pulmonary effort is normal.     Breath sounds: Normal breath sounds. No decreased breath sounds, wheezing or rales.  Musculoskeletal:     Cervical back: Normal range of motion.     Right lower leg: No edema.     Left lower leg: No edema.  Lymphadenopathy:     Cervical: No cervical adenopathy.  Skin:    General: Skin is warm  and dry.     Capillary Refill: Capillary refill takes less than 2 seconds.     Findings: No erythema or rash.  Neurological:     General: No focal deficit present.  Mental Status: He is alert and oriented to person, place, and time.     Motor: No weakness.     Coordination: Coordination normal.     Gait: Gait is intact. Gait normal.  Psychiatric:        Mood and Affect: Mood normal.        Behavior: Behavior normal. Behavior is cooperative.        Thought Content: Thought content normal.        Judgment: Judgment normal.       Assessment & Plan:   Small cell lung cancer, left upper lobe (HCC) Plan: Continue follow-up with oncology Continue follow-up with radiation oncology Continue to work on stopping smoking We will order pulmonary function testing Follow-up with our office in 2 to 3 months  Tobacco abuse Current smoker 5 cigarettes a day 28-pack-year smoking history  Plan: Continue to work on reducing smoking work to stopping completely Continue 21 mg nicotine patch, will send a prescription for 14 mg nicotine patch We will send in prescription for 4 mg nicotine lozenge Pulmonary function testing ordered Keep follow-up with oncology and radiation oncology  Emphysema of lung ([place]) Plan: Pulmonary function testing ordered Offered inhaler trial of LABA/LAMA, patient declined at this time Emphasized importance of stopping smoking Deferred walk today due to upcoming appointment with radiation oncology, can complete at next visit    Return in about 3 months (around 07/27/2020), or if symptoms worsen or fail to improve, for Follow up with Dr. Vaughan Browner, Follow up for FULL PFT - 60 min.   Lauraine Rinne, NP 04/26/2020   This appointment required 32 minutes of patient care (this includes precharting, chart review, review of results, face-to-face care, etc.).

## 2020-04-26 NOTE — Assessment & Plan Note (Signed)
Current smoker 5 cigarettes a day 28-pack-year smoking history  Plan: Continue to work on reducing smoking work to stopping completely Continue 21 mg nicotine patch, will send a prescription for 14 mg nicotine patch We will send in prescription for 4 mg nicotine lozenge Pulmonary function testing ordered Keep follow-up with oncology and radiation oncology

## 2020-04-27 ENCOUNTER — Other Ambulatory Visit: Payer: Self-pay | Admitting: Internal Medicine

## 2020-04-28 NOTE — Telephone Encounter (Signed)
Done erx 

## 2020-04-29 ENCOUNTER — Telehealth: Payer: Self-pay | Admitting: *Deleted

## 2020-04-29 ENCOUNTER — Ambulatory Visit
Admission: RE | Admit: 2020-04-29 | Discharge: 2020-04-29 | Disposition: A | Discharge status: Home / Self Care | Payer: 59 | Source: Ambulatory Visit | Attending: Radiation Oncology | Admitting: Radiation Oncology

## 2020-04-29 ENCOUNTER — Other Ambulatory Visit: Payer: Self-pay

## 2020-04-29 DIAGNOSIS — Z5112 Encounter for antineoplastic immunotherapy: Secondary | ICD-10-CM | POA: Diagnosis not present

## 2020-04-29 NOTE — Telephone Encounter (Signed)
Returned patient's phone call, lvm for a return call 

## 2020-04-30 ENCOUNTER — Ambulatory Visit
Admission: RE | Admit: 2020-04-30 | Discharge: 2020-04-30 | Disposition: A | Discharge status: Home / Self Care | Payer: 59 | Source: Ambulatory Visit | Attending: Radiation Oncology | Admitting: Radiation Oncology

## 2020-04-30 ENCOUNTER — Other Ambulatory Visit: Payer: Self-pay

## 2020-04-30 DIAGNOSIS — Z5112 Encounter for antineoplastic immunotherapy: Secondary | ICD-10-CM | POA: Diagnosis not present

## 2020-04-30 NOTE — Progress Notes (Signed)
Pharmacist Chemotherapy Monitoring - Initial Assessment    Anticipated start date: 05/06/20   Regimen:  . Are orders appropriate based on the patient's diagnosis, regimen, and cycle? Yes . Does the plan date match the patient's scheduled date? Yes . Is the sequencing of drugs appropriate? Yes . Are the premedications appropriate for the patient's regimen? Yes . Prior Authorization for treatment is: Approved o If applicable, is the correct biosimilar selected based on the patient's insurance? not applicable  Organ Function and Labs: Marland Kitchen Are dose adjustments needed based on the patient's renal function, hepatic function, or hematologic function? No . Are appropriate labs ordered prior to the start of patient's treatment? Yes . Other organ system assessment, if indicated: N/A . The following baseline labs, if indicated, have been ordered: durvalumab: baseline TSH +/- T4  Dose Assessment: . Are the drug doses appropriate? Yes . Are the following correct: o Drug concentrations Yes o IV fluid compatible with drug Yes o Administration routes Yes o Timing of therapy Yes . If applicable, does the patient have documented access for treatment and/or plans for port-a-cath placement? not applicable . If applicable, have lifetime cumulative doses been properly documented and assessed? not applicable Lifetime Dose Tracking  No doses have been documented on this patient for the following tracked chemicals: Doxorubicin, Epirubicin, Idarubicin, Daunorubicin, Mitoxantrone, Bleomycin, Oxaliplatin, Carboplatin, Liposomal Doxorubicin  o   Toxicity Monitoring/Prevention: . The patient has the following take home antiemetics prescribed: N/A . The patient has the following take home medications prescribed: N/A . Medication allergies and previous infusion related reactions, if applicable, have been reviewed and addressed. Yes . The patient's current medication list has been assessed for drug-drug interactions  with their chemotherapy regimen. no significant drug-drug interactions were identified on review.  Order Review: . Are the treatment plan orders signed? Yes . Is the patient scheduled to see a provider prior to their treatment? Yes  I verify that I have reviewed each item in the above checklist and answered each question accordingly.  Skylarr Liz K 04/30/2020 8:27 AM

## 2020-05-01 ENCOUNTER — Ambulatory Visit
Admission: RE | Admit: 2020-05-01 | Discharge: 2020-05-01 | Disposition: A | Discharge status: Home / Self Care | Payer: 59 | Source: Ambulatory Visit | Attending: Radiation Oncology | Admitting: Radiation Oncology

## 2020-05-01 ENCOUNTER — Other Ambulatory Visit: Payer: Self-pay

## 2020-05-01 ENCOUNTER — Inpatient Hospital Stay: Payer: 59 | Attending: Internal Medicine

## 2020-05-01 DIAGNOSIS — Z8546 Personal history of malignant neoplasm of prostate: Secondary | ICD-10-CM | POA: Insufficient documentation

## 2020-05-01 DIAGNOSIS — K219 Gastro-esophageal reflux disease without esophagitis: Secondary | ICD-10-CM | POA: Insufficient documentation

## 2020-05-01 DIAGNOSIS — J449 Chronic obstructive pulmonary disease, unspecified: Secondary | ICD-10-CM | POA: Insufficient documentation

## 2020-05-01 DIAGNOSIS — Z791 Long term (current) use of non-steroidal anti-inflammatories (NSAID): Secondary | ICD-10-CM | POA: Insufficient documentation

## 2020-05-01 DIAGNOSIS — I10 Essential (primary) hypertension: Secondary | ICD-10-CM | POA: Insufficient documentation

## 2020-05-01 DIAGNOSIS — Z87442 Personal history of urinary calculi: Secondary | ICD-10-CM | POA: Insufficient documentation

## 2020-05-01 DIAGNOSIS — C7931 Secondary malignant neoplasm of brain: Secondary | ICD-10-CM | POA: Insufficient documentation

## 2020-05-01 DIAGNOSIS — Z79899 Other long term (current) drug therapy: Secondary | ICD-10-CM | POA: Insufficient documentation

## 2020-05-01 DIAGNOSIS — Z5112 Encounter for antineoplastic immunotherapy: Secondary | ICD-10-CM | POA: Insufficient documentation

## 2020-05-01 DIAGNOSIS — F419 Anxiety disorder, unspecified: Secondary | ICD-10-CM | POA: Insufficient documentation

## 2020-05-01 DIAGNOSIS — Z5111 Encounter for antineoplastic chemotherapy: Secondary | ICD-10-CM | POA: Insufficient documentation

## 2020-05-01 DIAGNOSIS — C3412 Malignant neoplasm of upper lobe, left bronchus or lung: Secondary | ICD-10-CM | POA: Insufficient documentation

## 2020-05-01 DIAGNOSIS — E785 Hyperlipidemia, unspecified: Secondary | ICD-10-CM | POA: Insufficient documentation

## 2020-05-02 ENCOUNTER — Encounter: Payer: Self-pay | Admitting: Internal Medicine

## 2020-05-02 ENCOUNTER — Ambulatory Visit
Admission: RE | Admit: 2020-05-02 | Discharge: 2020-05-02 | Disposition: A | Discharge status: Home / Self Care | Payer: 59 | Source: Ambulatory Visit | Attending: Radiation Oncology | Admitting: Radiation Oncology

## 2020-05-02 ENCOUNTER — Ambulatory Visit (HOSPITAL_COMMUNITY)
Admission: RE | Admit: 2020-05-02 | Discharge: 2020-05-02 | Disposition: A | Discharge status: Home / Self Care | Payer: 59 | Source: Ambulatory Visit | Attending: Internal Medicine | Admitting: Internal Medicine

## 2020-05-02 ENCOUNTER — Other Ambulatory Visit: Payer: Self-pay

## 2020-05-02 DIAGNOSIS — C3412 Malignant neoplasm of upper lobe, left bronchus or lung: Secondary | ICD-10-CM | POA: Insufficient documentation

## 2020-05-02 LAB — GLUCOSE, CAPILLARY: Glucose-Capillary: 117 mg/dL — ABNORMAL HIGH (ref 70–99)

## 2020-05-02 MED ORDER — FLUDEOXYGLUCOSE F - 18 (FDG) INJECTION
8.0200 | Freq: Once | INTRAVENOUS | Status: AC | PRN
  Administered 2020-05-02: 8.02 via INTRAVENOUS

## 2020-05-02 NOTE — Progress Notes (Signed)
Met with patient at registration to obtain income for [employer].  Patient approved for one-time $1000 [employer] grant to assist with personal expenses while going through treatment. Gave him a copy of the approval letter as well as the expense sheet and went over in detail. He received a gift card today from his grant.  He has my card for any additional financial questions or concerns.

## 2020-05-03 ENCOUNTER — Other Ambulatory Visit: Payer: Self-pay

## 2020-05-03 ENCOUNTER — Ambulatory Visit
Admission: RE | Admit: 2020-05-03 | Discharge: 2020-05-03 | Disposition: A | Discharge status: Home / Self Care | Payer: 59 | Source: Ambulatory Visit | Attending: Radiation Oncology | Admitting: Radiation Oncology

## 2020-05-03 DIAGNOSIS — Z5112 Encounter for antineoplastic immunotherapy: Secondary | ICD-10-CM | POA: Diagnosis not present

## 2020-05-06 ENCOUNTER — Inpatient Hospital Stay: Payer: 59

## 2020-05-06 ENCOUNTER — Ambulatory Visit
Admission: RE | Admit: 2020-05-06 | Discharge: 2020-05-06 | Disposition: A | Discharge status: Home / Self Care | Payer: 59 | Source: Ambulatory Visit | Attending: Radiation Oncology | Admitting: Radiation Oncology

## 2020-05-06 ENCOUNTER — Other Ambulatory Visit: Payer: Self-pay | Admitting: Medical Oncology

## 2020-05-06 ENCOUNTER — Encounter: Payer: Self-pay | Admitting: Internal Medicine

## 2020-05-06 ENCOUNTER — Other Ambulatory Visit: Payer: Self-pay

## 2020-05-06 ENCOUNTER — Inpatient Hospital Stay (HOSPITAL_BASED_OUTPATIENT_CLINIC_OR_DEPARTMENT_OTHER): Payer: 59 | Admitting: Internal Medicine

## 2020-05-06 ENCOUNTER — Ambulatory Visit (HOSPITAL_BASED_OUTPATIENT_CLINIC_OR_DEPARTMENT_OTHER): Payer: 59 | Admitting: Medical

## 2020-05-06 VITALS — BP 112/79 | HR 83 | Temp 97.9°F | Resp 17 | Ht 72.0 in | Wt 161.7 lb

## 2020-05-06 VITALS — BP 114/72 | HR 76 | Temp 98.4°F | Resp 18

## 2020-05-06 DIAGNOSIS — C3412 Malignant neoplasm of upper lobe, left bronchus or lung: Secondary | ICD-10-CM

## 2020-05-06 DIAGNOSIS — Z7189 Other specified counseling: Secondary | ICD-10-CM

## 2020-05-06 DIAGNOSIS — I10 Essential (primary) hypertension: Secondary | ICD-10-CM | POA: Diagnosis not present

## 2020-05-06 DIAGNOSIS — L500 Allergic urticaria: Secondary | ICD-10-CM

## 2020-05-06 DIAGNOSIS — Z5112 Encounter for antineoplastic immunotherapy: Secondary | ICD-10-CM

## 2020-05-06 DIAGNOSIS — M79601 Pain in right arm: Secondary | ICD-10-CM

## 2020-05-06 DIAGNOSIS — L539 Erythematous condition, unspecified: Secondary | ICD-10-CM

## 2020-05-06 DIAGNOSIS — T8090XA Unspecified complication following infusion and therapeutic injection, initial encounter: Secondary | ICD-10-CM

## 2020-05-06 DIAGNOSIS — Z5111 Encounter for antineoplastic chemotherapy: Secondary | ICD-10-CM

## 2020-05-06 DIAGNOSIS — I878 Other specified disorders of veins: Secondary | ICD-10-CM

## 2020-05-06 LAB — CBC WITH DIFFERENTIAL (CANCER CENTER ONLY)
Abs Immature Granulocytes: 0.04 10*3/uL (ref 0.00–0.07)
Basophils Absolute: 0 10*3/uL (ref 0.0–0.1)
Basophils Relative: 0 %
Eosinophils Absolute: 0.4 10*3/uL (ref 0.0–0.5)
Eosinophils Relative: 6 %
HCT: 37.8 % — ABNORMAL LOW (ref 39.0–52.0)
Hemoglobin: 12.9 g/dL — ABNORMAL LOW (ref 13.0–17.0)
Immature Granulocytes: 1 %
Lymphocytes Relative: 13 %
Lymphs Abs: 1 10*3/uL (ref 0.7–4.0)
MCH: 33.9 pg (ref 26.0–34.0)
MCHC: 34.1 g/dL (ref 30.0–36.0)
MCV: 99.2 fL (ref 80.0–100.0)
Monocytes Absolute: 0.5 10*3/uL (ref 0.1–1.0)
Monocytes Relative: 7 %
Neutro Abs: 5.6 10*3/uL (ref 1.7–7.7)
Neutrophils Relative %: 73 %
Platelet Count: 267 10*3/uL (ref 150–400)
RBC: 3.81 MIL/uL — ABNORMAL LOW (ref 4.22–5.81)
RDW: 11.7 % (ref 11.5–15.5)
WBC Count: 7.5 10*3/uL (ref 4.0–10.5)
nRBC: 0 % (ref 0.0–0.2)

## 2020-05-06 LAB — CMP (CANCER CENTER ONLY)
ALT: 26 U/L (ref 0–44)
AST: 21 U/L (ref 15–41)
Albumin: 3.4 g/dL — ABNORMAL LOW (ref 3.5–5.0)
Alkaline Phosphatase: 69 U/L (ref 38–126)
Anion gap: 11 (ref 5–15)
BUN: 9 mg/dL (ref 6–20)
CO2: 25 mmol/L (ref 22–32)
Calcium: 10 mg/dL (ref 8.9–10.3)
Chloride: 105 mmol/L (ref 98–111)
Creatinine: 0.8 mg/dL (ref 0.61–1.24)
GFR, Est AFR Am: >60 mL/min (ref >60)
GFR, Estimated: >60 mL/min (ref >60)
Glucose, Bld: 142 mg/dL — ABNORMAL HIGH (ref 70–99)
Potassium: 4.2 mmol/L (ref 3.5–5.1)
Sodium: 141 mmol/L (ref 135–145)
Total Bilirubin: 0.5 mg/dL (ref 0.3–1.2)
Total Protein: 7.4 g/dL (ref 6.5–8.1)

## 2020-05-06 LAB — TSH: TSH: 0.853 u[IU]/mL (ref 0.350–4.500)

## 2020-05-06 MED ORDER — SODIUM CHLORIDE 0.9 % IV SOLN
10.0000 mg | Freq: Once | INTRAVENOUS | Status: AC
  Administered 2020-05-06: 10 mg via INTRAVENOUS
  Filled 2020-05-06: qty 10

## 2020-05-06 MED ORDER — DIPHENHYDRAMINE HCL 50 MG/ML IJ SOLN
50.0000 mg | Freq: Once | INTRAMUSCULAR | Status: AC | PRN
  Administered 2020-05-06: 25 mg via INTRAVENOUS

## 2020-05-06 MED ORDER — SODIUM CHLORIDE 0.9 % IV SOLN
1500.0000 mg | Freq: Once | INTRAVENOUS | Status: AC
  Administered 2020-05-06: 1500 mg via INTRAVENOUS
  Filled 2020-05-06: qty 30

## 2020-05-06 MED ORDER — SODIUM CHLORIDE 0.9 % IV SOLN
Freq: Once | INTRAVENOUS | Status: DC | PRN
  Filled 2020-05-06: qty 250

## 2020-05-06 MED ORDER — SODIUM CHLORIDE 0.9 % IV SOLN
100.0000 mg/m2 | Freq: Once | INTRAVENOUS | Status: AC
  Administered 2020-05-06: 190 mg via INTRAVENOUS
  Filled 2020-05-06: qty 9.5

## 2020-05-06 MED ORDER — PALONOSETRON HCL INJECTION 0.25 MG/5ML
INTRAVENOUS | Status: AC
  Filled 2020-05-06: qty 5

## 2020-05-06 MED ORDER — SODIUM CHLORIDE 0.9 % IV SOLN
639.5000 mg | Freq: Once | INTRAVENOUS | Status: AC
  Administered 2020-05-06: 639.5 mg via INTRAVENOUS
  Filled 2020-05-06: qty 63.95

## 2020-05-06 MED ORDER — SODIUM CHLORIDE 0.9 % IV SOLN
Freq: Once | INTRAVENOUS | Status: AC
  Filled 2020-05-06: qty 250

## 2020-05-06 MED ORDER — SODIUM CHLORIDE 0.9 % IV SOLN
150.0000 mg | Freq: Once | INTRAVENOUS | Status: AC
  Administered 2020-05-06: 150 mg via INTRAVENOUS
  Filled 2020-05-06: qty 150

## 2020-05-06 MED ORDER — TRILACICLIB DIHYDROCHLORIDE INJECTION 300 MG
240.0000 mg/m2 | Freq: Once | INTRAVENOUS | Status: AC
  Administered 2020-05-06: 465 mg via INTRAVENOUS
  Filled 2020-05-06: qty 31

## 2020-05-06 MED ORDER — PALONOSETRON HCL INJECTION 0.25 MG/5ML
0.2500 mg | Freq: Once | INTRAVENOUS | Status: AC
  Administered 2020-05-06: 0.25 mg via INTRAVENOUS

## 2020-05-06 NOTE — Progress Notes (Signed)
Pt reports that he had some burning in his arm during his Cosela infusion. Arm appears reddened and some hives noted in the mid-forearm area. IV site itself not reddened or swollen. IV infusing well. Infusion paused and [employer] notified. IV normal saline infusing @ 980ml/hr. Benadryl 25mg  ordered per Sandi Mealy and given per [facility].

## 2020-05-06 NOTE — Patient Instructions (Signed)
Durvalumab injection What is this medicine? DURVALUMAB (dur VAL ue mab) is a monoclonal antibody. It is used to treat urothelial cancer and lung cancer. This medicine may be used for other purposes; ask your health care provider or pharmacist if you have questions. COMMON BRAND NAME(S): IMFINZI What should I tell my health care provider before I take this medicine? They need to know if you have any of these conditions:  diabetes  immune system problems  infection  inflammatory bowel disease  kidney disease  liver disease  lung or breathing disease  lupus  organ transplant  stomach or intestine problems  thyroid disease  an unusual or allergic reaction to durvalumab, other medicines, foods, dyes, or preservatives  pregnant or trying to get pregnant  breast-feeding How should I use this medicine? This medicine is for infusion into a vein. It is given by a health care professional in a hospital or clinic setting. A special MedGuide will be given to you before each treatment. Be sure to read this information carefully each time. Talk to your pediatrician regarding the use of this medicine in children. Special care may be needed. Overdosage: If you think you have taken too much of this medicine contact a poison control center or emergency room at once. NOTE: This medicine is only for you. Do not share this medicine with others. What if I miss a dose? It is important not to miss your dose. Call your doctor or health care professional if you are unable to keep an appointment. What may interact with this medicine? Interactions have not been studied. This list may not describe all possible interactions. Give your health care provider a list of all the medicines, herbs, non-prescription drugs, or dietary supplements you use. Also tell them if you smoke, drink alcohol, or use illegal drugs. Some items may interact with your medicine. What should I watch for while using this  medicine? This drug may make you feel generally unwell. Continue your course of treatment even though you feel ill unless your doctor tells you to stop. You may need blood work done while you are taking this medicine. Do not become pregnant while taking this medicine or for 3 months after stopping it. Women should inform their doctor if they wish to become pregnant or think they might be pregnant. There is a potential for serious side effects to an unborn child. Talk to your health care professional or pharmacist for more information. Do not breast-feed an infant while taking this medicine or for 3 months after stopping it. What side effects may I notice from receiving this medicine? Side effects that you should report to your doctor or health care professional as soon as possible:  allergic reactions like skin rash, itching or hives, swelling of the face, lips, or tongue  black, tarry stools  bloody or watery diarrhea  breathing problems  change in emotions or moods  change in sex drive  changes in vision  chest pain or chest tightness  chills  confusion  cough  facial flushing  fever  headache  signs and symptoms of high blood sugar such as dizziness; dry mouth; dry skin; fruity breath; nausea; stomach pain; increased hunger or thirst; increased urination  signs and symptoms of liver injury like dark yellow or brown urine; general ill feeling or flu-like symptoms; light-colored stools; loss of appetite; nausea; right upper belly pain; unusually weak or tired; yellowing of the eyes or skin  stomach pain  trouble passing urine or change in  the amount of urine  weight gain or weight loss Side effects that usually do not require medical attention (report these to your doctor or health care professional if they continue or are bothersome):  bone pain  constipation  loss of appetite  muscle pain  nausea  swelling of the ankles, feet, hands  tiredness This list  may not describe all possible side effects. Call your doctor for medical advice about side effects. You may report side effects to FDA at 1-800-FDA-1088. Where should I keep my medicine? This drug is given in a hospital or clinic and will not be stored at home. NOTE: This sheet is a summary. It may not cover all possible information. If you have questions about this medicine, talk to your doctor, pharmacist, or health care provider.  2020 Elsevier/Gold Standard (2017-01-19 19:25:04)  Carboplatin injection What is this medicine? CARBOPLATIN (KAR boe pla tin) is a chemotherapy drug. It targets fast dividing cells, like cancer cells, and causes these cells to die. This medicine is used to treat ovarian cancer and many other cancers. This medicine may be used for other purposes; ask your health care provider or pharmacist if you have questions. COMMON BRAND NAME(S): Paraplatin What should I tell my health care provider before I take this medicine? They need to know if you have any of these conditions:  blood disorders  hearing problems  kidney disease  recent or ongoing radiation therapy  an unusual or allergic reaction to carboplatin, cisplatin, other chemotherapy, other medicines, foods, dyes, or preservatives  pregnant or trying to get pregnant  breast-feeding How should I use this medicine? This drug is usually given as an infusion into a vein. It is administered in a hospital or clinic by a specially trained health care professional. Talk to your pediatrician regarding the use of this medicine in children. Special care may be needed. Overdosage: If you think you have taken too much of this medicine contact a poison control center or emergency room at once. NOTE: This medicine is only for you. Do not share this medicine with others. What if I miss a dose? It is important not to miss a dose. Call your doctor or health care professional if you are unable to keep an appointment. What  may interact with this medicine?  medicines for seizures  medicines to increase blood counts like filgrastim, pegfilgrastim, sargramostim  some antibiotics like amikacin, gentamicin, neomycin, streptomycin, tobramycin  vaccines Talk to your doctor or health care professional before taking any of these medicines:  acetaminophen  aspirin  ibuprofen  ketoprofen  naproxen This list may not describe all possible interactions. Give your health care provider a list of all the medicines, herbs, non-prescription drugs, or dietary supplements you use. Also tell them if you smoke, drink alcohol, or use illegal drugs. Some items may interact with your medicine. What should I watch for while using this medicine? Your condition will be monitored carefully while you are receiving this medicine. You will need important blood work done while you are taking this medicine. This drug may make you feel generally unwell. This is not uncommon, as chemotherapy can affect healthy cells as well as cancer cells. Report any side effects. Continue your course of treatment even though you feel ill unless your doctor tells you to stop. In some cases, you may be given additional medicines to help with side effects. Follow all directions for their use. Call your doctor or health care professional for advice if you get a fever,  chills or sore throat, or other symptoms of a cold or flu. Do not treat yourself. This drug decreases your body's ability to fight infections. Try to avoid being around people who are sick. This medicine may increase your risk to bruise or bleed. Call your doctor or health care professional if you notice any unusual bleeding. Be careful brushing and flossing your teeth or using a toothpick because you may get an infection or bleed more easily. If you have any dental work done, tell your dentist you are receiving this medicine. Avoid taking products that contain aspirin, acetaminophen, ibuprofen,  naproxen, or ketoprofen unless instructed by your doctor. These medicines may hide a fever. Do not become pregnant while taking this medicine. Women should inform their doctor if they wish to become pregnant or think they might be pregnant. There is a potential for serious side effects to an unborn child. Talk to your health care professional or pharmacist for more information. Do not breast-feed an infant while taking this medicine. What side effects may I notice from receiving this medicine? Side effects that you should report to your doctor or health care professional as soon as possible:  allergic reactions like skin rash, itching or hives, swelling of the face, lips, or tongue  signs of infection - fever or chills, cough, sore throat, pain or difficulty passing urine  signs of decreased platelets or bleeding - bruising, pinpoint red spots on the skin, black, tarry stools, nosebleeds  signs of decreased red blood cells - unusually weak or tired, fainting spells, lightheadedness  breathing problems  changes in hearing  changes in vision  chest pain  high blood pressure  low blood counts - This drug may decrease the number of white blood cells, red blood cells and platelets. You may be at increased risk for infections and bleeding.  nausea and vomiting  pain, swelling, redness or irritation at the injection site  pain, tingling, numbness in the hands or feet  problems with balance, talking, walking  trouble passing urine or change in the amount of urine Side effects that usually do not require medical attention (report to your doctor or health care professional if they continue or are bothersome):  hair loss  loss of appetite  metallic taste in the mouth or changes in taste This list may not describe all possible side effects. Call your doctor for medical advice about side effects. You may report side effects to FDA at 1-800-FDA-1088. Where should I keep my medicine? This  drug is given in a hospital or clinic and will not be stored at home. NOTE: This sheet is a summary. It may not cover all possible information. If you have questions about this medicine, talk to your doctor, pharmacist, or health care provider.  2020 Elsevier/Gold Standard (2008-02-14 14:38:05)  Etoposide, VP-16 injection What is this medicine? ETOPOSIDE, VP-16 (e toe POE side) is a chemotherapy drug. It is used to treat testicular cancer, lung cancer, and other cancers. This medicine may be used for other purposes; ask your health care provider or pharmacist if you have questions. COMMON BRAND NAME(S): Etopophos, Toposar, VePesid What should I tell my health care provider before I take this medicine? They need to know if you have any of these conditions:  infection  kidney disease  liver disease  low blood counts, like low white cell, platelet, or red cell counts  an unusual or allergic reaction to etoposide, other medicines, foods, dyes, or preservatives  pregnant or trying to get pregnant  breast-feeding How should I use this medicine? This medicine is for infusion into a vein. It is administered in a hospital or clinic by a specially trained health care professional. Talk to your pediatrician regarding the use of this medicine in children. Special care may be needed. Overdosage: If you think you have taken too much of this medicine contact a poison control center or emergency room at once. NOTE: This medicine is only for you. Do not share this medicine with others. What if I miss a dose? It is important not to miss your dose. Call your doctor or health care professional if you are unable to keep an appointment. What may interact with this medicine? This medicine may interact with the following medications:  warfarin This list may not describe all possible interactions. Give your health care provider a list of all the medicines, herbs, non-prescription drugs, or dietary  supplements you use. Also tell them if you smoke, drink alcohol, or use illegal drugs. Some items may interact with your medicine. What should I watch for while using this medicine? Visit your doctor for checks on your progress. This drug may make you feel generally unwell. This is not uncommon, as chemotherapy can affect healthy cells as well as cancer cells. Report any side effects. Continue your course of treatment even though you feel ill unless your doctor tells you to stop. In some cases, you may be given additional medicines to help with side effects. Follow all directions for their use. Call your doctor or health care professional for advice if you get a fever, chills or sore throat, or other symptoms of a cold or flu. Do not treat yourself. This drug decreases your body's ability to fight infections. Try to avoid being around people who are sick. This medicine may increase your risk to bruise or bleed. Call your doctor or health care professional if you notice any unusual bleeding. Talk to your doctor about your risk of cancer. You may be more at risk for certain types of cancers if you take this medicine. Do not become pregnant while taking this medicine or for at least 6 months after stopping it. Women should inform their doctor if they wish to become pregnant or think they might be pregnant. Women of child-bearing potential will need to have a negative pregnancy test before starting this medicine. There is a potential for serious side effects to an unborn child. Talk to your health care professional or pharmacist for more information. Do not breast-feed an infant while taking this medicine. Men must use a latex condom during sexual contact with a woman while taking this medicine and for at least 4 months after stopping it. A latex condom is needed even if you have had a vasectomy. Contact your doctor right away if your partner becomes pregnant. Do not donate sperm while taking this medicine and  for at least 4 months after you stop taking this medicine. Men should inform their doctors if they wish to father a child. This medicine may lower sperm counts. What side effects may I notice from receiving this medicine? Side effects that you should report to your doctor or health care professional as soon as possible:  allergic reactions like skin rash, itching or hives, swelling of the face, lips, or tongue  low blood counts - this medicine may decrease the number of white blood cells, red blood cells, and platelets. You may be at increased risk for infections and bleeding  nausea, vomiting  redness, blistering, peeling  or loosening of the skin, including inside the mouth  signs and symptoms of infection like fever; chills; cough; sore throat; pain or trouble passing urine  signs and symptoms of low red blood cells or anemia such as unusually weak or tired; feeling faint or lightheaded; falls; breathing problems  unusual bruising or bleeding Side effects that usually do not require medical attention (report to your doctor or health care professional if they continue or are bothersome):  changes in taste  diarrhea  hair loss  loss of appetite  mouth sores This list may not describe all possible side effects. Call your doctor for medical advice about side effects. You may report side effects to FDA at 1-800-FDA-1088. Where should I keep my medicine? This drug is given in a hospital or clinic and will not be stored at home. NOTE: This sheet is a summary. It may not cover all possible information. If you have questions about this medicine, talk to your doctor, pharmacist, or health care provider.  2020 Elsevier/Gold Standard (2019-01-04 16:57:15)

## 2020-05-06 NOTE — Progress Notes (Signed)
[place] [phone]   [phone]  OFFICE PROGRESS NOTE  Richard Borg, MD [place] [id number]  DIAGNOSIS: Extensive stage (T2b, N2, M1c) small cell lung cancer presented with left upper lobe pulmonary nodule in addition to left hilar mass with mediastinal invasion and solitary brain metastasis diagnosed in May 2021  PRIOR THERAPY:None  CURRENT THERAPY:  Palliative systemic chemotherapy with carboplatin for AUC of 5 on day 1, etoposide 100 mg/M2 on days 1, 2 and 3 as well as Imfinzi 1500 mg IV every 3 weeks with the chemotherapy.  First dose of chemotherapy May 06, 2020.  The patient will receive treatment with Cosela on the days of his chemotherapy.  INTERVAL HISTORY: Richard Santos 59 y.o. male returns to the clinic today for follow-up visit accompanied by his wife.  The patient is feeling fine today with no concerning complaints.  He had a PET scan performed recently.  He denied having any current chest pain but has shortness of breath with exertion with mild cough and no hemoptysis.  He denied having any fever or chills.  He has no nausea, vomiting, diarrhea or constipation.  He has no headache or visual changes.  He denied having any recent weight loss or night sweats.  The patient is here today for evaluation before starting the first cycle of his systemic chemotherapy.  MEDICAL HISTORY: Past Medical History:  Diagnosis Date  . Allergic rhinitis   . Allergy   . Anxiety   . Chronic low back pain   . DDD (degenerative disc disease), lumbar   . ED (erectile dysfunction)   . Finger injury    cut pads off 3rd and 4th finger left hand/due to lawn mower accident  . GERD (gastroesophageal reflux disease)   . Heart murmur    as child only-   . History of kidney stones   . HTN (hypertension) 08/20/2016  . Hyperlipidemia   . Incomplete right bundle branch block   . Prostate cancer ([place]) dx 10/02/14   stage T1c  .  Prostate cancer ([place])   . Sigmoid diverticulosis   . Wears glasses     ALLERGIES:  is allergic to bee venom.  MEDICATIONS:  Current Outpatient Medications  Medication Sig Dispense Refill  . acetaminophen (TYLENOL) 325 MG tablet Take 2 tablets (650 mg total) by mouth every 6 (six) hours as needed for mild pain (or Fever >/= 101). 30 tablet 0  . atorvastatin (LIPITOR) 40 MG tablet Take 1 tablet (40 mg total) by mouth daily. 90 tablet 3  . busPIRone (BUSPAR) 10 MG tablet Take 1 tablet (10 mg total) by mouth 2 (two) times daily. 180 tablet 3  . EPINEPHrine (EPIPEN) 0.3 mg/0.3 mL IJ SOAJ injection Inject 0.3 mLs (0.3 mg total) into the muscle once. 2 Device 2  . folic acid (FOLVITE) 1 MG tablet Take 1 tablet (1 mg total) by mouth daily. 30 tablet 0  . losartan (COZAAR) 50 MG tablet Take 1 tablet (50 mg total) by mouth daily. 90 tablet 3  . meloxicam (MOBIC) 15 MG tablet TAKE 1 TABLET BY MOUTH EVERY DAY AS NEEDED FOR PAIN 30 tablet 2  . Multiple Vitamin (MULTIVITAMIN) capsule Take 1 capsule by mouth daily.    . nicotine (NICODERM CQ - DOSED IN MG/24 HOURS) 21 mg/24hr patch Place 1 patch (21 mg total) onto the skin daily. 28 patch 0  . nicotine (NICODERM CQ) 14 mg/24hr patch Place  1 patch (14 mg total) onto the skin daily. 28 patch 3  . nicotine polacrilex (COMMIT) 4 MG lozenge Take 1 lozenge (4 mg total) by mouth as needed for smoking cessation. 108 tablet 6  . pantoprazole (PROTONIX) 40 MG tablet Take 1 tablet (40 mg total) by mouth daily before breakfast. 90 tablet 3  . prochlorperazine (COMPAZINE) 10 MG tablet Take 1 tablet (10 mg total) by mouth every 6 (six) hours as needed for nausea or vomiting. 30 tablet 0  . traMADol (ULTRAM) 50 MG tablet TAKE HALF TABLET BY MOUTH 2 TIMES DAILY AS NEEDED FOR PAIN 60 tablet 1   No current facility-administered medications for this visit.    SURGICAL HISTORY:  Past Surgical History:  Procedure Laterality Date  . BRONCHIAL BIOPSY  04/12/2020    Procedure: BRONCHIAL BIOPSIES;  Surgeon: Marshell Garfinkel, MD;  Location: [facility];  Service: Cardiopulmonary;;  . BRONCHIAL BRUSHINGS  04/12/2020   Procedure: BRONCHIAL BRUSHINGS;  Surgeon: Marshell Garfinkel, MD;  Location: [facility];  Service: Cardiopulmonary;;  . BRONCHIAL NEEDLE ASPIRATION BIOPSY  04/12/2020   Procedure: BRONCHIAL NEEDLE ASPIRATION BIOPSIES;  Surgeon: Marshell Garfinkel, MD;  Location: [facility];  Service: Cardiopulmonary;;  . BRONCHIAL WASHINGS  04/12/2020   Procedure: BRONCHIAL WASHINGS;  Surgeon: Marshell Garfinkel, MD;  Location: [facility];  Service: Cardiopulmonary;;  . COLONOSCOPY    . COLONOSCOPY W/ POLYPECTOMY  04-19-2012  . ENDOBRONCHIAL ULTRASOUND N/A 04/12/2020   Procedure: ENDOBRONCHIAL ULTRASOUND;  Surgeon: Marshell Garfinkel, MD;  Location: [facility];  Service: Cardiopulmonary;  Laterality: N/A;  . ESOPHAGOGASTRODUODENOSCOPY  08-05-2010  . HEMOSTASIS CONTROL  04/12/2020   Procedure: HEMOSTASIS CONTROL;  Surgeon: Marshell Garfinkel, MD;  Location: [facility];  Service: Cardiopulmonary;;  . POLYPECTOMY    . PROSTATE BIOPSY  10/02/14  . RADIOACTIVE SEED IMPLANT N/A 01/30/2015   Procedure: RADIOACTIVE SEED IMPLANT;  Surgeon: Rana Snare, MD;  Location: [facility];  Service: Urology;  Laterality: N/A;  . UPPER GASTROINTESTINAL ENDOSCOPY    . VIDEO BRONCHOSCOPY N/A 04/12/2020   Procedure: VIDEO BRONCHOSCOPY WITHOUT FLUORO;  Surgeon: Marshell Garfinkel, MD;  Location: [facility];  Service: Cardiopulmonary;  Laterality: N/A;    REVIEW OF SYSTEMS:  Constitutional: positive for fatigue Eyes: negative Ears, nose, mouth, throat, and face: negative Respiratory: positive for cough Cardiovascular: negative Gastrointestinal: negative Genitourinary:negative Integument/breast: negative Hematologic/lymphatic: negative Musculoskeletal:negative Neurological: negative Behavioral/Psych: negative Endocrine: negative Allergic/Immunologic: negative   PHYSICAL  EXAMINATION: General appearance: alert, cooperative, fatigued and no distress Head: Normocephalic, without obvious abnormality, atraumatic Neck: no adenopathy, no JVD, supple, symmetrical, trachea midline and thyroid not enlarged, symmetric, no tenderness/mass/nodules Lymph nodes: Cervical, supraclavicular, and axillary nodes normal. Resp: clear to auscultation bilaterally Back: symmetric, no curvature. ROM normal. No CVA tenderness. Cardio: regular rate and rhythm, S1, S2 normal, no murmur, click, rub or gallop GI: soft, non-tender; bowel sounds normal; no masses,  no organomegaly Extremities: extremities normal, atraumatic, no cyanosis or edema Neurologic: Alert and oriented X 3, normal strength and tone. Normal symmetric reflexes. Normal coordination and gait  ECOG PERFORMANCE STATUS: 1 - Symptomatic but completely ambulatory  Blood pressure 112/79, pulse 83, temperature 97.9 F (36.6 C), temperature source Temporal, resp. rate 17, height 6' (1.829 m), weight 161 lb 11.2 oz (73.3 kg), SpO2 100 %.  LABORATORY DATA: Lab Results  Component Value Date   WBC 7.5 05/06/2020   HGB 12.9 (L) 05/06/2020   HCT 37.8 (L) 05/06/2020   MCV 99.2 05/06/2020   PLT 267 05/06/2020      Chemistry  Component Value Date/Time   NA 139 04/18/2020 1429   K 4.2 04/18/2020 1429   CL 102 04/18/2020 1429   CO2 26 04/18/2020 1429   BUN 13 04/18/2020 1429   CREATININE 0.92 04/18/2020 1429      Component Value Date/Time   CALCIUM 9.5 04/18/2020 1429   ALKPHOS 59 04/18/2020 1429   AST 33 04/18/2020 1429   ALT 44 04/18/2020 1429   BILITOT 0.3 04/18/2020 1429       RADIOGRAPHIC STUDIES: DG Chest 2 View  Result Date: 04/11/2020 CLINICAL DATA:  Syncopal episode for 2 minutes, intermittent LEFT chest pain over last week or 2 radiating to LEFT axilla, pale and diaphoretic, history hypertension, smoker, prostate cancer EXAM: CHEST - 2 VIEW COMPARISON:  12/21/2014, 04/03/2020 FINDINGS: Normal heart  size and pulmonary vascularity. Abnormal enlargement of LEFT pulmonary hilum. Emphysematous and bronchitic changes consistent with COPD. Asymmetric apical scarring greater on LEFT. No acute infiltrate, pleural effusion or pneumothorax. Probable BILATERAL nipple shadows. Bones demineralized. IMPRESSION: COPD changes with enlargement of LEFT pulmonary hilum, cannot exclude perihilar mass or hilar adenopathy; CT chest with contrast recommended for further assessment. Remainder of exam unremarkable. Findings called to Dr. Johnney Killian on 04/11/2020 at 1038 hours. Electronically Signed   By: Lavonia Dana M.D.   On: 04/11/2020 10:37   CT Angio Chest PE W/Cm &/Or Wo Cm  Result Date: 04/11/2020 CLINICAL DATA:  Chest pain and SOB. Suspected lung mass. EXAM: CT ANGIOGRAPHY CHEST WITH CONTRAST TECHNIQUE: Multidetector CT imaging of the chest was performed using the standard protocol during bolus administration of intravenous contrast. Multiplanar CT image reconstructions and MIPs were obtained to evaluate the vascular anatomy. CONTRAST:  123mL OMNIPAQUE IOHEXOL 350 MG/ML SOLN COMPARISON:  Chest radiograph from 04/11/2020 FINDINGS: Cardiovascular: Satisfactory opacification of the pulmonary arteries to the segmental level. No evidence of pulmonary embolism. Normal heart size. No pericardial effusion. Aortic atherosclerosis. Lad, left circumflex and RCA coronary artery calcifications. Mediastinum/Nodes: Normal appearance of the thyroid gland. The trachea appears patent and is midline. Normal appearance of the esophagus. Left hilar/AP window mass measures 4.7 x 4.5 by 4.8 cm. This invades the mediastinum and left hilum. There is encasement of the left upper lobe pulmonary arteries with significant luminal narrowing. Encasement of the distal left mainstem bronchus and its branches are also identified, image 81/9. Adjacent left pre-vascular lymph node is borderline enlarged measuring 1.3 cm, image 61/6. No subcarinal, right  paratracheal or right hilar adenopathy. No supraclavicular or axillary adenopathy. Lungs/Pleura: No pleural effusion. Centrilobular and paraseptal emphysema identified. Biapical pleuroparenchymal scarring noted. Within the anteromedial left upper lobe there is a subpleural nodular density which is nonspecific measuring 1.5 x 0.9 cm, image 77/7. Patchy areas of ground-glass attenuation are noted within the upper lobes. Index ground-glass density within the left upper lobe measures 2.0 x 1.1 cm, image 48/7. Upper Abdomen: No acute findings within the imaged portions of the upper abdomen. Musculoskeletal: Thoracolumbar scoliosis. No suspicious or acute bone lesions. Review of the MIP images confirms the above findings. IMPRESSION: 1. No evidence for acute pulmonary embolus. 2. Left hilar/AP window mass is identified which invades the mediastinum and left hilum. Highly concerning for primary bronchogenic carcinoma. Encasement of the left upper lobe pulmonary arteries and its branches is noted with significant luminal narrowing. Also noted is a borderline enlarged left pre-vascular lymph node, which likely represents a metastatic lymph node. Recommend referral to pulmonary medicine/thoracic surgery multi disciplinary thoracic oncology group for further workup and management. 3. There is a nonspecific  subpleural nodular density in the anteromedial left upper lobe measuring 1.5 cm. This could be further addressed at PET-CT. 4. Patchy areas of ground-glass attenuation are noted within the upper lobes. These are nonspecific and without previous imaging for comparison may be inflammatory or infectious in etiology. 5. Emphysema and aortic atherosclerosis. Coronary artery calcifications. Aortic Atherosclerosis (ICD10-I70.0) and Emphysema (ICD10-J43.9). Electronically Signed   By: Kerby Moors M.D.   On: 04/11/2020 13:22   MR BRAIN W WO CONTRAST  Result Date: 04/11/2020 CLINICAL DATA:  59 year old male with recently  diagnosed left lung mass. Staging. EXAM: MRI HEAD WITHOUT AND WITH CONTRAST TECHNIQUE: Multiplanar, multiecho pulse sequences of the brain and surrounding structures were obtained without and with intravenous contrast. CONTRAST:  7.96mL GADAVIST GADOBUTROL 1 MMOL/ML IV SOLN COMPARISON:  CTA chest earlier today. FINDINGS: Brain: 6 mm small enhancing mass in the inferior left cerebellum (series 10, image 6). No significant cerebellar edema. No mass effect. This does have mildly Abner diffusion on the basis of hypercellularity. No other abnormal intracranial enhancement identified. Small right cingulate gyrus developmental venous anomaly (DVA series 11, image 15, normal variant). No dural thickening identified. No midline shift or intracranial mass effect. No restricted diffusion suggestive of acute infarction. No ventriculomegaly, extra-axial collection or acute intracranial hemorrhage. Cervicomedullary junction and pituitary are within normal limits. No cortical encephalomalacia or chronic cerebral blood products identified. Mild to moderate for age nonspecific white matter signal changes. Vascular: Major intracranial vascular flow voids are preserved. The major dural venous sinuses are enhancing and appear to be patent. Skull and upper cervical spine: Negative visible cervical spine. Normal bone marrow signal. Sinuses/Orbits: Negative orbits. Bilateral paranasal sinus mucosal thickening and opacification. Other: Trace right mastoid effusion. Negative nasopharynx. Visible internal auditory structures appear normal. Scalp and face soft tissues appear negative. IMPRESSION: 1. Solitary 6 mm enhancing mass in the inferior left cerebellum compatible with a solitary brain metastasis. No edema or mass effect. 2. No other metastatic disease or acute intracranial abnormality identified. Mild to moderate for age nonspecific white matter signal changes, most commonly due to chronic small vessel disease. 3. Bilateral paranasal  sinus disease. Electronically Signed   By: Genevie Ann M.D.   On: 04/11/2020 19:23   NM PET Image Initial (PI) Skull Base To Thigh  Result Date: 05/02/2020 CLINICAL DATA:  Initial treatment strategy for small cell lung cancer. EXAM: NUCLEAR MEDICINE PET SKULL BASE TO THIGH TECHNIQUE: 8.02 mCi F-18 FDG was injected intravenously. Full-ring PET imaging was performed from the skull base to thigh after the radiotracer. CT data was obtained and used for attenuation correction and anatomic localization. Fasting blood glucose: 117 mg/dl COMPARISON:  CT chest of Apr 11, 2020 FINDINGS: Mediastinal blood pool activity: SUV max 2.32 Liver activity: SUV max NA NECK: No hypermetabolic lymph nodes in the neck. Incidental CT findings: Atheromatous plaque throughout the carotid arteries. CHEST: Hypermetabolic areas in the LEFT chest corresponding to LEFT upper lobe mass and LEFT hilar mass. Anterior LEFT upper lobe nodularity also with increased FDG uptake. No thoracic inlet hypermetabolic lymph nodes. No contralateral hilar involvement or additional mediastinal involvement (Image 65, series 4) mixed density area with peripheral soft tissue density in the LEFT upper lobe measuring 3.3 x 2.2 cm, area of ground-glass seen along the medial aspect of this location on the previous study measured approximately 2 x 1.3 cm. (SUVmax = 6.2) Mediastinal and juxta hilar nodal mass (SUVmax = 8.6 (image 76 of series 4 currently measuring approximately 2.8 by 2.3 cm greatest  dimension, previously approximately 4.4 x 2.4 cm when measured in a similar fashion. Decreased hilar fullness along the anterior aspect of the LEFT upper lobe bronchus 19 mm thickness as compared to 2.9 cm. Area of nodularity along the anterior LEFT chest was present previously open parent image 36, series 8) 1.4 x 0.8 cm previously approximately 1.7 x 0.9 cm (SUVmax = 4.8) Scattered areas of ground-glass and nodularity in the LEFT upper lobe elsewhere and signs of apical  scarring are unchanged. Background pulmonary emphysema as before. Incidental CT findings: Calcified coronary artery disease. Normal heart size. No adenopathy by size criteria in the chest. No chest wall lesion. ABDOMEN/PELVIS: No abnormal hypermetabolic activity within the liver, pancreas, adrenal glands, or spleen. No hypermetabolic lymph nodes in the abdomen or pelvis. Incidental CT findings: Brachytherapy seeds in the prostate. No acute gastrointestinal process. Atheromatous plaque of the abdominal aorta. SKELETON: No focal hypermetabolic activity to suggest skeletal metastasis. Incidental CT findings: Spinal degenerative changes without acute or destructive bone process. IMPRESSION: 1. Diminished size of LEFT juxta hilar mass with metabolic activity as described in this patient with known small cell lung cancer. 2. Area of mixed density in the LEFT upper lobe which encompasses the site of previous nodularity perhaps a combination of post radiation change and tumor. Correlate with any respiratory symptoms. Attention on follow-up. 3. Decreasing size of nodule along the anterior LEFT chest. Subtle ground-glass surrounding around this area may indicate treatment as well. This also shows increased metabolic activity and is suspicious for disease. 4. Aortic atherosclerosis, signs of prior prostate cancer with brachytherapy seeds. Signs of pulmonary emphysema as outlined above. Aortic Atherosclerosis (ICD10-I70.0) and Emphysema (ICD10-J43.9). Electronically Signed   By: Zetta Bills M.D.   On: 05/02/2020 15:21   DG CHEST PORT 1 VIEW  Result Date: 04/12/2020 CLINICAL DATA:  Lung mass, post bronchoscopy today EXAM: PORTABLE CHEST 1 VIEW COMPARISON:  Portable exam 1449 hours compared to 04/11/2020 FINDINGS: Normal heart size and pulmonary vascularity. Slight rotation to the RIGHT, which decreases the prominence of the previously identified RIGHT hilar mass. Scattered LEFT perihilar infiltrate, likely sequela of  bronchoscopy and biopsy. No pleural effusion or pneumothorax. IMPRESSION: Mild LEFT perihilar infiltrate likely related to preceding bronchoscopy and biopsy. No pleural effusion or pneumothorax identified. Previously identified LEFT hilar mass less well demonstrated on current portable exam rotated to the RIGHT. Electronically Signed   By: Lavonia Dana M.D.   On: 04/12/2020 15:15   ECHOCARDIOGRAM COMPLETE  Result Date: 04/12/2020    ECHOCARDIOGRAM REPORT   Patient Name:   Maysin JAMAL PAVON Date of Exam: 04/12/2020 Medical Rec #:  [id number]      Height:       72.0 in Accession #:    [id number]     Weight:       160.5 lb Date of Birth:  Sep 14, 1961      BSA:          1.940 m Patient Age:    59 years       BP:           119/78 mmHg Patient Gender: M              HR:           79 bpm. Exam Location:  Inpatient Procedure: 2D Echo Indications:    Syncope 780.2 / R55  History:        Patient has no prior history of Echocardiogram examinations.  Signs/Symptoms:Syncope; Risk Factors:Tobacco abuse, Hypertension                 and Dyslipidemia. Alcohol abuse.  Sonographer:    Vikki Ports Turrentine Referring Phys: [id number] [place]  1. Left ventricular ejection fraction, by estimation, is 50 to 55%. The left ventricle has low normal function. The left ventricle has no regional wall motion abnormalities. Left ventricular diastolic parameters were normal.  2. Right ventricular systolic function is normal. The right ventricular size is normal. Tricuspid regurgitation signal is inadequate for assessing PA pressure.  3. The mitral valve is normal in structure. No evidence of mitral valve regurgitation. No evidence of mitral stenosis.  4. The aortic valve is tricuspid. Aortic valve regurgitation is not visualized. No aortic stenosis is present.  5. The inferior vena cava is normal in size with greater than 50% respiratory variability, suggesting right atrial pressure of 3 mmHg. FINDINGS  Left Ventricle:  Left ventricular ejection fraction, by estimation, is 50 to 55%. The left ventricle has low normal function. The left ventricle has no regional wall motion abnormalities. The left ventricular internal cavity size was normal in size. There is no left ventricular hypertrophy. Left ventricular diastolic parameters were normal. Right Ventricle: The right ventricular size is normal. No increase in right ventricular wall thickness. Right ventricular systolic function is normal. Tricuspid regurgitation signal is inadequate for assessing PA pressure. Left Atrium: Left atrial size was normal in size. Right Atrium: Right atrial size was normal in size. Pericardium: There is no evidence of pericardial effusion. Mitral Valve: The mitral valve is normal in structure. Normal mobility of the mitral valve leaflets. No evidence of mitral valve regurgitation. No evidence of mitral valve stenosis. Tricuspid Valve: The tricuspid valve is normal in structure. Tricuspid valve regurgitation is not demonstrated. No evidence of tricuspid stenosis. Aortic Valve: The aortic valve is tricuspid. Aortic valve regurgitation is not visualized. No aortic stenosis is present. Pulmonic Valve: The pulmonic valve was normal in structure. Pulmonic valve regurgitation is trivial. No evidence of pulmonic stenosis. Aorta: The aortic root is normal in size and structure. Venous: The inferior vena cava is normal in size with greater than 50% respiratory variability, suggesting right atrial pressure of 3 mmHg. IAS/Shunts: No atrial level shunt detected by color flow Doppler.  LEFT VENTRICLE PLAX 2D LVIDd:         5.00 cm  Diastology LVIDs:         3.70 cm  LV e' lateral:   14.00 cm/s LV PW:         0.70 cm  LV E/e' lateral: 4.3 LV IVS:        0.70 cm  LV e' medial:    9.25 cm/s LVOT diam:     2.10 cm  LV E/e' medial:  6.5 LV SV:         69 LV SV Index:   36 LVOT Area:     3.46 cm  RIGHT VENTRICLE RV S prime:     11.20 cm/s TAPSE (M-mode): 2.2 cm LEFT ATRIUM              Index LA diam:        3.60 cm 1.86 cm/m LA Vol (A2C):   64.4 ml 33.19 ml/m LA Vol (A4C):   43.9 ml 22.62 ml/m LA Biplane Vol: 53.3 ml 27.47 ml/m  AORTIC VALVE LVOT Vmax:   98.70 cm/s LVOT Vmean:  70.700 cm/s LVOT VTI:    0.199 m  AORTA Ao  Root diam: 3.40 cm MITRAL VALVE MV Area (PHT): 3.65 cm    SHUNTS MV Decel Time: 208 msec    Systemic VTI:  0.20 m MV E velocity: 60.00 cm/s  Systemic Diam: 2.10 cm MV A velocity: 64.30 cm/s MV E/A ratio:  0.93 Cherlynn Kaiser MD Electronically signed by Cherlynn Kaiser MD Signature Date/Time: 04/12/2020/12:50:40 PM    Final     ASSESSMENT AND PLAN: This is a very pleasant 59 years old white male recently diagnosed with extensive stage small cell lung cancer presented with left upper lobe lung mass in addition to left hilar and mediastinal lymphadenopathy as well as solitary brain metastasis diagnosed in May 2021. The patient had a recent PET scan.  I personally and independently reviewed the scans and discussed the results with the patient and his wife. I recommended for the patient to proceed with his systemic chemotherapy with carboplatin, etoposide and Imfinzi as well as Cosela as planned today. I will see him back for follow-up visit in 1 week for evaluation and management of any adverse effect of his treatment. The patient will be out of town in 3 weeks and I will delay the start of cycle number 2 by 1 week to start on June 03, 2020 to accommodate his trip to [place] to attend the [place] of his son-in-law. The patient was advised to call immediately if he has any concerning symptoms in the interval. The patient voices understanding of current disease status and treatment options and is in agreement with the current care plan.  All questions were answered. The patient knows to call the clinic with any problems, questions or concerns. We can certainly see the patient much sooner if necessary. The total time spent in the appointment was 30  minutes.  Disclaimer: This note was dictated with voice recognition software. Similar sounding words can inadvertently be transcribed and may not be corrected upon review.

## 2020-05-07 ENCOUNTER — Inpatient Hospital Stay: Payer: 59

## 2020-05-07 ENCOUNTER — Other Ambulatory Visit: Payer: Self-pay

## 2020-05-07 ENCOUNTER — Ambulatory Visit
Admission: RE | Admit: 2020-05-07 | Discharge: 2020-05-07 | Disposition: A | Discharge status: Home / Self Care | Payer: 59 | Source: Ambulatory Visit | Attending: Radiation Oncology | Admitting: Radiation Oncology

## 2020-05-07 VITALS — BP 111/68 | HR 79 | Temp 97.8°F | Resp 18

## 2020-05-07 DIAGNOSIS — C3412 Malignant neoplasm of upper lobe, left bronchus or lung: Secondary | ICD-10-CM

## 2020-05-07 DIAGNOSIS — Z5112 Encounter for antineoplastic immunotherapy: Secondary | ICD-10-CM | POA: Diagnosis not present

## 2020-05-07 MED ORDER — OXYCODONE HCL 5 MG PO TABS
ORAL_TABLET | ORAL | Status: AC
  Filled 2020-05-07: qty 1

## 2020-05-07 MED ORDER — TRILACICLIB DIHYDROCHLORIDE INJECTION 300 MG
240.0000 mg/m2 | Freq: Once | INTRAVENOUS | Status: AC
  Administered 2020-05-07: 465 mg via INTRAVENOUS
  Filled 2020-05-07: qty 31

## 2020-05-07 MED ORDER — DIPHENHYDRAMINE HCL 50 MG/ML IJ SOLN
25.0000 mg | Freq: Once | INTRAMUSCULAR | Status: AC
  Administered 2020-05-07: 25 mg via INTRAVENOUS

## 2020-05-07 MED ORDER — OXYCODONE HCL 5 MG PO TABS
5.0000 mg | ORAL_TABLET | Freq: Once | ORAL | Status: AC
  Administered 2020-05-07: 5 mg via ORAL

## 2020-05-07 MED ORDER — DIPHENHYDRAMINE HCL 50 MG/ML IJ SOLN
INTRAMUSCULAR | Status: AC
  Filled 2020-05-07: qty 1

## 2020-05-07 MED ORDER — SODIUM CHLORIDE 0.9 % IV SOLN
100.0000 mg/m2 | Freq: Once | INTRAVENOUS | Status: AC
  Administered 2020-05-07: 190 mg via INTRAVENOUS
  Filled 2020-05-07: qty 9.5

## 2020-05-07 MED ORDER — SODIUM CHLORIDE 0.9 % IV SOLN
Freq: Once | INTRAVENOUS | Status: AC
  Filled 2020-05-07: qty 250

## 2020-05-07 MED ORDER — ACETAMINOPHEN 325 MG PO TABS
325.0000 mg | ORAL_TABLET | Freq: Once | ORAL | Status: AC
  Administered 2020-05-07: 325 mg via ORAL

## 2020-05-07 MED ORDER — ACETAMINOPHEN 500 MG PO TABS: 1000.0000 mg | ORAL_TABLET | Freq: Once | ORAL | Status: DC

## 2020-05-07 MED ORDER — ACETAMINOPHEN 325 MG PO TABS
ORAL_TABLET | ORAL | Status: AC
  Filled 2020-05-07: qty 1

## 2020-05-07 MED ORDER — SODIUM CHLORIDE 0.9 % IV SOLN
10.0000 mg | Freq: Once | INTRAVENOUS | Status: AC
  Administered 2020-05-07: 10 mg via INTRAVENOUS
  Filled 2020-05-07: qty 10

## 2020-05-07 NOTE — Progress Notes (Signed)
Patient began to c/o of pain the left arm during cosela infusion. IV flushes and is not is not infiltrated. Sandi Mealy called and the nurse was instructed to continue with the infusion. The patient was given pain medication, see MAR. The pain decreased but remained present.  No further complaints.

## 2020-05-07 NOTE — Progress Notes (Signed)
05/07/20  Reaction with Cosela on Cycle #1 day 1 - resolved with diphenhydramine 25 mg IVP.  Orders received to add diphenhydramine 25 mg IVP to premedications for Cosela.  T.O. Sandi Mealy, PA/Margaret Staggs Ronnald Ramp, PharmD

## 2020-05-07 NOTE — Patient Instructions (Signed)
[facility] Discharge Instructions for Patients Receiving Chemotherapy  Today you received the following chemotherapy agents Etoposide; Cosela  To help prevent nausea and vomiting after your treatment, we encourage you to take your nausea medication as directed   If you develop nausea and vomiting that is not controlled by your nausea medication, call the clinic.   BELOW ARE SYMPTOMS THAT SHOULD BE REPORTED IMMEDIATELY:  *FEVER GREATER THAN 100.5 F  *CHILLS WITH OR WITHOUT FEVER  NAUSEA AND VOMITING THAT IS NOT CONTROLLED WITH YOUR NAUSEA MEDICATION  *UNUSUAL SHORTNESS OF BREATH  *UNUSUAL BRUISING OR BLEEDING  TENDERNESS IN MOUTH AND THROAT WITH OR WITHOUT PRESENCE OF ULCERS  *URINARY PROBLEMS  *BOWEL PROBLEMS  UNUSUAL RASH Items with * indicate a potential emergency and should be followed up as soon as possible.  Feel free to call the clinic should you have any questions or concerns. The clinic phone number is [phone].  Please show the [facility] at check-in to the Emergency Department and triage nurse.

## 2020-05-08 ENCOUNTER — Other Ambulatory Visit: Payer: Self-pay

## 2020-05-08 ENCOUNTER — Inpatient Hospital Stay: Payer: 59

## 2020-05-08 ENCOUNTER — Other Ambulatory Visit: Payer: Self-pay | Admitting: Internal Medicine

## 2020-05-08 ENCOUNTER — Ambulatory Visit
Admission: RE | Admit: 2020-05-08 | Discharge: 2020-05-08 | Disposition: A | Discharge status: Home / Self Care | Payer: 59 | Source: Ambulatory Visit | Attending: Radiation Oncology | Admitting: Radiation Oncology

## 2020-05-08 ENCOUNTER — Other Ambulatory Visit: Payer: Self-pay | Admitting: Medical

## 2020-05-08 VITALS — BP 123/80 | HR 73 | Temp 97.8°F | Resp 18

## 2020-05-08 DIAGNOSIS — C3412 Malignant neoplasm of upper lobe, left bronchus or lung: Secondary | ICD-10-CM

## 2020-05-08 DIAGNOSIS — T8090XD Unspecified complication following infusion and therapeutic injection, subsequent encounter: Secondary | ICD-10-CM

## 2020-05-08 DIAGNOSIS — Z5112 Encounter for antineoplastic immunotherapy: Secondary | ICD-10-CM | POA: Diagnosis not present

## 2020-05-08 MED ORDER — SODIUM CHLORIDE 0.9 % IV SOLN
10.0000 mg | Freq: Once | INTRAVENOUS | Status: AC
  Administered 2020-05-08: 10 mg via INTRAVENOUS
  Filled 2020-05-08: qty 10

## 2020-05-08 MED ORDER — SODIUM CHLORIDE 0.9 % IV SOLN
100.0000 mg/m2 | Freq: Once | INTRAVENOUS | Status: AC
  Administered 2020-05-08: 190 mg via INTRAVENOUS
  Filled 2020-05-08: qty 9.5

## 2020-05-08 MED ORDER — DIPHENHYDRAMINE HCL 50 MG/ML IJ SOLN
INTRAMUSCULAR | Status: AC
  Filled 2020-05-08: qty 1

## 2020-05-08 MED ORDER — SODIUM CHLORIDE 0.9 % IV SOLN
Freq: Once | INTRAVENOUS | Status: AC
  Filled 2020-05-08: qty 250

## 2020-05-08 MED ORDER — HEPARIN SOD (PORK) LOCK FLUSH 100 UNIT/ML IV SOLN
500.0000 [IU] | Freq: Once | INTRAVENOUS | Status: DC | PRN
  Filled 2020-05-08: qty 5

## 2020-05-08 MED ORDER — TRILACICLIB DIHYDROCHLORIDE INJECTION 300 MG
240.0000 mg/m2 | Freq: Once | INTRAVENOUS | Status: AC
  Administered 2020-05-08: 465 mg via INTRAVENOUS
  Filled 2020-05-08: qty 31

## 2020-05-08 MED ORDER — SODIUM CHLORIDE 0.9% FLUSH
10.0000 mL | INTRAVENOUS | Status: DC | PRN
  Filled 2020-05-08: qty 10

## 2020-05-08 MED ORDER — OXYCODONE HCL 5 MG PO TABS
ORAL_TABLET | ORAL | Status: AC
  Filled 2020-05-08: qty 1

## 2020-05-08 MED ORDER — OXYCODONE HCL 5 MG PO TABS
10.0000 mg | ORAL_TABLET | Freq: Once | ORAL | Status: AC
  Administered 2020-05-08: 5 mg via ORAL

## 2020-05-08 MED ORDER — DIPHENHYDRAMINE HCL 50 MG/ML IJ SOLN
25.0000 mg | Freq: Once | INTRAMUSCULAR | Status: AC
  Administered 2020-05-08: 25 mg via INTRAVENOUS

## 2020-05-08 NOTE — Progress Notes (Signed)
Patient complaining of pain in right arm about 10 minutes after Cosela infusion started, rating pain a 7/10. Infusion was paused and pain medication was administered, as well as NS flush. Pharmacy was consulted for guidance and recommended slowing rate to infuse over an hour. Infusion was restarted after pt voiced improvement in symptoms. Pt currently tolerating infusion at slower rate. States that he still has some pain, but that is is much better and he is able to tolerate it.

## 2020-05-08 NOTE — Progress Notes (Signed)
    DATE:  05/06/2020                                          X  CHEMO/IMMUNOTHERAPY REACTION           MD:  Dr. Fanny Bien. Mohamed   AGENT/BLOOD PRODUCT RECEIVING TODAY:              Cosela   AGENT/BLOOD PRODUCT RECEIVING IMMEDIATELY PRIOR TO REACTION:          Cosela   REACTION(S):            Erythema, hives, and pain proximal to a right anterior forearm IV site.   PREMEDS:      Dexamethasone, Emend, and Aloxi   INTERVENTION: Cosela was paused.  The patient was given Benadryl 25 mg IV x1.  His line was flushed with normal saline.  His IV was discontinued and was started in another location.  The patient was reassured that his reaction was directly related to [place].  He is pending a port placement.   Review of Systems  Review of Systems  Constitutional: Negative for chills, diaphoresis and fever.  HENT: Negative for trouble swallowing and voice change.   Respiratory: Negative for cough, chest tightness, shortness of breath and wheezing.   Cardiovascular: Negative for chest pain and palpitations.  Gastrointestinal: Negative for abdominal pain, constipation, diarrhea, nausea and vomiting.  Musculoskeletal: Negative for back pain and myalgias.  Skin:       Erythema, hives, and pain proximal to a right anterior forearm IV site.  Neurological: Negative for dizziness, light-headedness and headaches.     Physical Exam  Physical Exam Constitutional:      General: He is not in acute distress.    Appearance: He is not diaphoretic.  HENT:     Head: Normocephalic and atraumatic.  Cardiovascular:     Rate and Rhythm: Normal rate and regular rhythm.     Heart sounds: Normal heart sounds. No murmur heard.  No friction rub. No gallop.   Pulmonary:     Effort: Pulmonary effort is normal. No respiratory distress.     Breath sounds: Normal breath sounds. No wheezing or rales.  Skin:    General: Skin is warm and dry.     Findings: Erythema and rash present.     Comments: There is an  area of erythema, tenderness, and wheals in the right anterior forearm proximal to an IV site.  Neurological:     Mental Status: He is alert.       OUTCOME:                A new IV site was established. Cosela was restarted after the patient was given Benadryl 25 mg IV.  He was able to restart and complete Cosela without any additional issues of concern.   This case was discussed with Dr. Julien Nordmann. He expressed agreement with my management of this patient.    Sandi Mealy, MHS, PA-C

## 2020-05-08 NOTE — Patient Instructions (Signed)
[facility] Discharge Instructions for Patients Receiving Chemotherapy  Today you received the following chemotherapy agents: etoposide  To help prevent nausea and vomiting after your treatment, we encourage you to take your nausea medication as directed.   If you develop nausea and vomiting that is not controlled by your nausea medication, call the clinic.   BELOW ARE SYMPTOMS THAT SHOULD BE REPORTED IMMEDIATELY:  *FEVER GREATER THAN 100.5 F  *CHILLS WITH OR WITHOUT FEVER  NAUSEA AND VOMITING THAT IS NOT CONTROLLED WITH YOUR NAUSEA MEDICATION  *UNUSUAL SHORTNESS OF BREATH  *UNUSUAL BRUISING OR BLEEDING  TENDERNESS IN MOUTH AND THROAT WITH OR WITHOUT PRESENCE OF ULCERS  *URINARY PROBLEMS  *BOWEL PROBLEMS  UNUSUAL RASH Items with * indicate a potential emergency and should be followed up as soon as possible.  Feel free to call the clinic should you have any questions or concerns. The clinic phone number is [phone].  Please show the CHEMO ALERT CARD at check-in to the Emergency Department and triage nurse.   

## 2020-05-09 ENCOUNTER — Other Ambulatory Visit: Payer: Self-pay

## 2020-05-09 ENCOUNTER — Ambulatory Visit
Admission: RE | Admit: 2020-05-09 | Discharge: 2020-05-09 | Disposition: A | Discharge status: Home / Self Care | Payer: 59 | Source: Ambulatory Visit | Attending: Radiation Oncology | Admitting: Radiation Oncology

## 2020-05-09 DIAGNOSIS — Z5112 Encounter for antineoplastic immunotherapy: Secondary | ICD-10-CM | POA: Diagnosis not present

## 2020-05-10 ENCOUNTER — Other Ambulatory Visit: Payer: Self-pay | Admitting: Radiation Oncology

## 2020-05-10 ENCOUNTER — Ambulatory Visit
Admission: RE | Admit: 2020-05-10 | Discharge: 2020-05-10 | Disposition: A | Discharge status: Home / Self Care | Payer: 59 | Source: Ambulatory Visit | Attending: Radiation Oncology | Admitting: Radiation Oncology

## 2020-05-10 ENCOUNTER — Other Ambulatory Visit: Payer: Self-pay

## 2020-05-10 DIAGNOSIS — Z5112 Encounter for antineoplastic immunotherapy: Secondary | ICD-10-CM | POA: Diagnosis not present

## 2020-05-10 MED ORDER — SUCRALFATE 1 G PO TABS
1.0000 g | ORAL_TABLET | Freq: Four times a day (QID) | ORAL | 0 refills | Status: DC
Start: 2020-05-10 — End: 2020-06-05

## 2020-05-13 ENCOUNTER — Inpatient Hospital Stay: Payer: 59

## 2020-05-13 ENCOUNTER — Ambulatory Visit
Admission: RE | Admit: 2020-05-13 | Discharge: 2020-05-13 | Disposition: A | Discharge status: Home / Self Care | Payer: 59 | Source: Ambulatory Visit | Attending: Radiation Oncology | Admitting: Radiation Oncology

## 2020-05-13 ENCOUNTER — Other Ambulatory Visit: Payer: Self-pay

## 2020-05-13 DIAGNOSIS — C3412 Malignant neoplasm of upper lobe, left bronchus or lung: Secondary | ICD-10-CM

## 2020-05-13 DIAGNOSIS — Z5112 Encounter for antineoplastic immunotherapy: Secondary | ICD-10-CM | POA: Diagnosis not present

## 2020-05-13 LAB — CMP (CANCER CENTER ONLY)
ALT: 41 U/L (ref 0–44)
AST: 26 U/L (ref 15–41)
Albumin: 3.7 g/dL (ref 3.5–5.0)
Alkaline Phosphatase: 84 U/L (ref 38–126)
Anion gap: 10 (ref 5–15)
BUN: 16 mg/dL (ref 6–20)
CO2: 27 mmol/L (ref 22–32)
Calcium: 10.1 mg/dL (ref 8.9–10.3)
Chloride: 100 mmol/L (ref 98–111)
Creatinine: 0.91 mg/dL (ref 0.61–1.24)
GFR, Est AFR Am: >60 mL/min (ref >60)
GFR, Estimated: >60 mL/min (ref >60)
Glucose, Bld: 111 mg/dL — ABNORMAL HIGH (ref 70–99)
Potassium: 4.3 mmol/L (ref 3.5–5.1)
Sodium: 137 mmol/L (ref 135–145)
Total Bilirubin: 0.5 mg/dL (ref 0.3–1.2)
Total Protein: 7.9 g/dL (ref 6.5–8.1)

## 2020-05-13 LAB — CBC WITH DIFFERENTIAL (CANCER CENTER ONLY)
Abs Immature Granulocytes: 0.1 K/uL — ABNORMAL HIGH (ref 0.00–0.07)
Basophils Absolute: 0 K/uL (ref 0.0–0.1)
Basophils Relative: 0 %
Eosinophils Absolute: 0.1 K/uL (ref 0.0–0.5)
Eosinophils Relative: 1 %
HCT: 39.9 % (ref 39.0–52.0)
Hemoglobin: 13.9 g/dL (ref 13.0–17.0)
Immature Granulocytes: 2 %
Lymphocytes Relative: 14 %
Lymphs Abs: 0.8 K/uL (ref 0.7–4.0)
MCH: 33.9 pg (ref 26.0–34.0)
MCHC: 34.8 g/dL (ref 30.0–36.0)
MCV: 97.3 fL (ref 80.0–100.0)
Monocytes Absolute: 0.1 K/uL (ref 0.1–1.0)
Monocytes Relative: 1 %
Neutro Abs: 5.1 K/uL (ref 1.7–7.7)
Neutrophils Relative %: 82 %
Platelet Count: 229 K/uL (ref 150–400)
RBC: 4.1 MIL/uL — ABNORMAL LOW (ref 4.22–5.81)
RDW: 11.6 % (ref 11.5–15.5)
WBC Count: 6.2 K/uL (ref 4.0–10.5)
nRBC: 0 % (ref 0.0–0.2)

## 2020-05-14 ENCOUNTER — Ambulatory Visit
Admission: RE | Admit: 2020-05-14 | Discharge: 2020-05-14 | Disposition: A | Discharge status: Home / Self Care | Payer: 59 | Source: Ambulatory Visit | Attending: Radiation Oncology | Admitting: Radiation Oncology

## 2020-05-14 ENCOUNTER — Other Ambulatory Visit: Payer: Self-pay | Admitting: Internal Medicine

## 2020-05-14 ENCOUNTER — Inpatient Hospital Stay (HOSPITAL_BASED_OUTPATIENT_CLINIC_OR_DEPARTMENT_OTHER): Payer: 59 | Admitting: Internal Medicine

## 2020-05-14 ENCOUNTER — Encounter: Payer: Self-pay | Admitting: Internal Medicine

## 2020-05-14 ENCOUNTER — Other Ambulatory Visit: Payer: Self-pay

## 2020-05-14 VITALS — BP 107/76 | HR 96 | Temp 97.5°F | Resp 19 | Ht 72.0 in | Wt 156.4 lb

## 2020-05-14 DIAGNOSIS — Z5111 Encounter for antineoplastic chemotherapy: Secondary | ICD-10-CM

## 2020-05-14 DIAGNOSIS — Z5112 Encounter for antineoplastic immunotherapy: Secondary | ICD-10-CM | POA: Diagnosis not present

## 2020-05-14 DIAGNOSIS — C3412 Malignant neoplasm of upper lobe, left bronchus or lung: Secondary | ICD-10-CM | POA: Diagnosis not present

## 2020-05-14 NOTE — Telephone Encounter (Signed)
Done erx 

## 2020-05-14 NOTE — Progress Notes (Signed)
[facility] Telephone:([phone]   [phone]  OFFICE PROGRESS NOTE  [name], MD [place] [address]  DIAGNOSIS: Extensive stage (T2b, N2, M1c) small cell lung cancer presented with left upper lobe pulmonary nodule in addition to left hilar mass with mediastinal invasion and solitary brain metastasis diagnosed in May 2021  PRIOR THERAPY:None  CURRENT THERAPY:  Palliative systemic chemotherapy with carboplatin for AUC of 5 on day 1, etoposide 100 mg/M2 on days 1, 2 and 3 as well as Imfinzi 1500 mg IV every 3 weeks with the chemotherapy.  First dose of chemotherapy May 06, 2020.  The patient will receive treatment with Cosela on the days of his chemotherapy.  Status post 1 cycle.  INTERVAL HISTORY: Richard Santos 59 y.o. male returns to the clinic today for follow-up visit accompanied by his wife.  The patient is feeling fine today with no concerning complaints.  He tolerated the first cycle of his treatment fairly well.  He has some phlebitis with the Cosela infusion but was able to continue his treatment.  He denied having any current chest pain, shortness of breath, cough or hemoptysis.  He denied having any fever or chills.  He has no nausea, vomiting, diarrhea or constipation.  He has no headache or visual changes.  The patient is here today for evaluation and repeat blood work.  MEDICAL HISTORY: Past Medical History:  Diagnosis Date  . Allergic rhinitis   . Allergy   . Anxiety   . Chronic low back pain   . DDD (degenerative disc disease), lumbar   . ED (erectile dysfunction)   . Finger injury    cut pads off 3rd and 4th finger left hand/due to lawn mower accident  . GERD (gastroesophageal reflux disease)   . Heart murmur    as child only-   . History of kidney stones   . HTN (hypertension) 08/20/2016  . Hyperlipidemia   . Incomplete right bundle branch block   . Prostate cancer ([place]) dx 10/02/14   stage T1c  .  Prostate cancer ([place])   . Sigmoid diverticulosis   . Wears glasses     ALLERGIES:  is allergic to bee venom.  MEDICATIONS:  Current Outpatient Medications  Medication Sig Dispense Refill  . acetaminophen (TYLENOL) 325 MG tablet Take 2 tablets (650 mg total) by mouth every 6 (six) hours as needed for mild pain (or Fever >/= 101). 30 tablet 0  . atorvastatin (LIPITOR) 40 MG tablet Take 1 tablet (40 mg total) by mouth daily. (Patient not taking: Reported on 05/06/2020) 90 tablet 3  . busPIRone (BUSPAR) 10 MG tablet Take 1 tablet (10 mg total) by mouth 2 (two) times daily. 180 tablet 3  . EPINEPHrine (EPIPEN) 0.3 mg/0.3 mL IJ SOAJ injection Inject 0.3 mLs (0.3 mg total) into the muscle once. 2 Device 2  . folic acid (FOLVITE) 1 MG tablet Take 1 tablet (1 mg total) by mouth daily. 30 tablet 0  . losartan (COZAAR) 50 MG tablet Take 1 tablet (50 mg total) by mouth daily. 90 tablet 3  . meloxicam (MOBIC) 15 MG tablet TAKE 1 TABLET BY MOUTH EVERY DAY AS NEEDED FOR PAIN 30 tablet 2  . Multiple Vitamin (MULTIVITAMIN) capsule Take 1 capsule by mouth daily.    . nicotine (NICODERM CQ - DOSED IN MG/24 HOURS) 21 mg/24hr patch Place 1 patch (21 mg total) onto the skin daily. 28 patch 0  .  nicotine (NICODERM CQ) 14 mg/24hr patch Place 1 patch (14 mg total) onto the skin daily. 28 patch 3  . nicotine polacrilex (COMMIT) 4 MG lozenge Take 1 lozenge (4 mg total) by mouth as needed for smoking cessation. 108 tablet 6  . pantoprazole (PROTONIX) 40 MG tablet Take 1 tablet (40 mg total) by mouth daily before breakfast. 90 tablet 3  . prochlorperazine (COMPAZINE) 10 MG tablet Take 1 tablet (10 mg total) by mouth every 6 (six) hours as needed for nausea or vomiting. 30 tablet 0  . sucralfate (CARAFATE) 1 g tablet Take 1 tablet (1 g total) by mouth 4 (four) times daily. Dissolve each tablet in 15 cc water before use. 120 tablet 0  . traMADol (ULTRAM) 50 MG tablet TAKE HALF TABLET BY MOUTH 2 TIMES DAILY AS NEEDED FOR  PAIN 60 tablet 1   No current facility-administered medications for this visit.    SURGICAL HISTORY:  Past Surgical History:  Procedure Laterality Date  . BRONCHIAL BIOPSY  04/12/2020   Procedure: BRONCHIAL BIOPSIES;  Surgeon: Marshell Garfinkel, MD;  Location: [facility];  Service: Cardiopulmonary;;  . BRONCHIAL BRUSHINGS  04/12/2020   Procedure: BRONCHIAL BRUSHINGS;  Surgeon: Marshell Garfinkel, MD;  Location: [facility];  Service: Cardiopulmonary;;  . BRONCHIAL NEEDLE ASPIRATION BIOPSY  04/12/2020   Procedure: BRONCHIAL NEEDLE ASPIRATION BIOPSIES;  Surgeon: Marshell Garfinkel, MD;  Location: [facility];  Service: Cardiopulmonary;;  . BRONCHIAL WASHINGS  04/12/2020   Procedure: BRONCHIAL WASHINGS;  Surgeon: Marshell Garfinkel, MD;  Location: [facility];  Service: Cardiopulmonary;;  . COLONOSCOPY    . COLONOSCOPY W/ POLYPECTOMY  04-19-2012  . ENDOBRONCHIAL ULTRASOUND N/A 04/12/2020   Procedure: ENDOBRONCHIAL ULTRASOUND;  Surgeon: Marshell Garfinkel, MD;  Location: [facility];  Service: Cardiopulmonary;  Laterality: N/A;  . ESOPHAGOGASTRODUODENOSCOPY  08-05-2010  . HEMOSTASIS CONTROL  04/12/2020   Procedure: HEMOSTASIS CONTROL;  Surgeon: Marshell Garfinkel, MD;  Location: [facility];  Service: Cardiopulmonary;;  . POLYPECTOMY    . PROSTATE BIOPSY  10/02/14  . RADIOACTIVE SEED IMPLANT N/A 01/30/2015   Procedure: RADIOACTIVE SEED IMPLANT;  Surgeon: Rana Snare, MD;  Location: [facility];  Service: Urology;  Laterality: N/A;  . UPPER GASTROINTESTINAL ENDOSCOPY    . VIDEO BRONCHOSCOPY N/A 04/12/2020   Procedure: VIDEO BRONCHOSCOPY WITHOUT FLUORO;  Surgeon: Marshell Garfinkel, MD;  Location: [facility];  Service: Cardiopulmonary;  Laterality: N/A;    REVIEW OF SYSTEMS:  A comprehensive review of systems was negative except for: Constitutional: positive for fatigue   PHYSICAL EXAMINATION: General appearance: alert, cooperative, fatigued and no distress Head: Normocephalic, without  obvious abnormality, atraumatic Neck: no adenopathy, no JVD, supple, symmetrical, trachea midline and thyroid not enlarged, symmetric, no tenderness/mass/nodules Lymph nodes: Cervical, supraclavicular, and axillary nodes normal. Resp: clear to auscultation bilaterally Back: symmetric, no curvature. ROM normal. No CVA tenderness. Cardio: regular rate and rhythm, S1, S2 normal, no murmur, click, rub or gallop GI: soft, non-tender; bowel sounds normal; no masses,  no organomegaly Extremities: extremities normal, atraumatic, no cyanosis or edema  ECOG PERFORMANCE STATUS: 1 - Symptomatic but completely ambulatory  Blood pressure 107/76, pulse 96, temperature (!) 97.5 F (36.4 C), temperature source Temporal, resp. rate 19, height 6' (1.829 m), weight 156 lb 6.4 oz (70.9 kg), SpO2 100 %.  LABORATORY DATA: Lab Results  Component Value Date   WBC 6.2 05/13/2020   HGB 13.9 05/13/2020   HCT 39.9 05/13/2020   MCV 97.3 05/13/2020   PLT 229 05/13/2020      Chemistry  Component Value Date/Time   NA 137 05/13/2020 1120   K 4.3 05/13/2020 1120   CL 100 05/13/2020 1120   CO2 27 05/13/2020 1120   BUN 16 05/13/2020 1120   CREATININE 0.91 05/13/2020 1120      Component Value Date/Time   CALCIUM 10.1 05/13/2020 1120   ALKPHOS 84 05/13/2020 1120   AST 26 05/13/2020 1120   ALT 41 05/13/2020 1120   BILITOT 0.5 05/13/2020 1120       RADIOGRAPHIC STUDIES: NM PET Image Initial (PI) Skull Base To Thigh  Result Date: 05/02/2020 CLINICAL DATA:  Initial treatment strategy for small cell lung cancer. EXAM: NUCLEAR MEDICINE PET SKULL BASE TO THIGH TECHNIQUE: 8.02 mCi F-18 FDG was injected intravenously. Full-ring PET imaging was performed from the skull base to thigh after the radiotracer. CT data was obtained and used for attenuation correction and anatomic localization. Fasting blood glucose: 117 mg/dl COMPARISON:  CT chest of Apr 11, 2020 FINDINGS: Mediastinal blood pool activity: SUV max 2.32  Liver activity: SUV max NA NECK: No hypermetabolic lymph nodes in the neck. Incidental CT findings: Atheromatous plaque throughout the carotid arteries. CHEST: Hypermetabolic areas in the LEFT chest corresponding to LEFT upper lobe mass and LEFT hilar mass. Anterior LEFT upper lobe nodularity also with increased FDG uptake. No thoracic inlet hypermetabolic lymph nodes. No contralateral hilar involvement or additional mediastinal involvement (Image 65, series 4) mixed density area with peripheral soft tissue density in the LEFT upper lobe measuring 3.3 x 2.2 cm, area of ground-glass seen along the medial aspect of this location on the previous study measured approximately 2 x 1.3 cm. (SUVmax = 6.2) Mediastinal and juxta hilar nodal mass (SUVmax = 8.6 (image 76 of series 4 currently measuring approximately 2.8 by 2.3 cm greatest dimension, previously approximately 4.4 x 2.4 cm when measured in a similar fashion. Decreased hilar fullness along the anterior aspect of the LEFT upper lobe bronchus 19 mm thickness as compared to 2.9 cm. Area of nodularity along the anterior LEFT chest was present previously open parent image 36, series 8) 1.4 x 0.8 cm previously approximately 1.7 x 0.9 cm (SUVmax = 4.8) Scattered areas of ground-glass and nodularity in the LEFT upper lobe elsewhere and signs of apical scarring are unchanged. Background pulmonary emphysema as before. Incidental CT findings: Calcified coronary artery disease. Normal heart size. No adenopathy by size criteria in the chest. No chest wall lesion. ABDOMEN/PELVIS: No abnormal hypermetabolic activity within the liver, pancreas, adrenal glands, or spleen. No hypermetabolic lymph nodes in the abdomen or pelvis. Incidental CT findings: Brachytherapy seeds in the prostate. No acute gastrointestinal process. Atheromatous plaque of the abdominal aorta. SKELETON: No focal hypermetabolic activity to suggest skeletal metastasis. Incidental CT findings: Spinal degenerative  changes without acute or destructive bone process. IMPRESSION: 1. Diminished size of LEFT juxta hilar mass with metabolic activity as described in this patient with known small cell lung cancer. 2. Area of mixed density in the LEFT upper lobe which encompasses the site of previous nodularity perhaps a combination of post radiation change and tumor. Correlate with any respiratory symptoms. Attention on follow-up. 3. Decreasing size of nodule along the anterior LEFT chest. Subtle ground-glass surrounding around this area may indicate treatment as well. This also shows increased metabolic activity and is suspicious for disease. 4. Aortic atherosclerosis, signs of prior prostate cancer with brachytherapy seeds. Signs of pulmonary emphysema as outlined above. Aortic Atherosclerosis (ICD10-I70.0) and Emphysema (ICD10-J43.9). Electronically Signed   By: Jewel Baize.D.  On: 05/02/2020 15:21    ASSESSMENT AND PLAN: This is a very pleasant 59 years old white male recently diagnosed with extensive stage small cell lung cancer presented with left upper lobe lung mass in addition to left hilar and mediastinal lymphadenopathy as well as solitary brain metastasis diagnosed in May 2021. He is currently undergoing systemic chemotherapy with carboplatin for AUC of 5 on day 1, etoposide 100 mg/M2 on days 1, 2 and 3 with Cosela infusion before chemotherapy.  He is status post 1 cycle of treatment started last week. The patient tolerated the first cycle of his treatment well with no concerning adverse effects. I recommended for the patient to continue his treatment as planned and he will start cycle #2 after his return from [place] on June 03, 2020. The patient was advised to call immediately if he has any concerning symptoms in the interval. The patient voices understanding of current disease status and treatment options and is in agreement with the current care plan.  All questions were answered. The patient knows to  call the clinic with any problems, questions or concerns. We can certainly see the patient much sooner if necessary. The total time spent in the appointment was 30 minutes.  Disclaimer: This note was dictated with voice recognition software. Similar sounding words can inadvertently be transcribed and may not be corrected upon review.

## 2020-05-15 ENCOUNTER — Encounter: Payer: Self-pay | Admitting: Radiation Oncology

## 2020-05-15 ENCOUNTER — Ambulatory Visit
Admission: RE | Admit: 2020-05-15 | Discharge: 2020-05-15 | Disposition: A | Discharge status: Home / Self Care | Payer: 59 | Source: Ambulatory Visit | Attending: Radiation Oncology | Admitting: Radiation Oncology

## 2020-05-15 ENCOUNTER — Telehealth: Payer: Self-pay | Admitting: Internal Medicine

## 2020-05-15 ENCOUNTER — Other Ambulatory Visit: Payer: Self-pay

## 2020-05-15 DIAGNOSIS — Z5112 Encounter for antineoplastic immunotherapy: Secondary | ICD-10-CM | POA: Diagnosis not present

## 2020-05-15 DIAGNOSIS — C3412 Malignant neoplasm of upper lobe, left bronchus or lung: Secondary | ICD-10-CM

## 2020-05-15 MED ORDER — SONAFINE EX EMUL
1.0000 "application " | Freq: Once | CUTANEOUS | Status: AC
  Administered 2020-05-15: 1 via TOPICAL

## 2020-05-16 ENCOUNTER — Inpatient Hospital Stay: Payer: 59 | Admitting: General Practice

## 2020-05-16 ENCOUNTER — Encounter: Payer: Self-pay | Admitting: *Deleted

## 2020-05-16 ENCOUNTER — Telehealth: Payer: Self-pay | Admitting: General Practice

## 2020-05-16 DIAGNOSIS — C3412 Malignant neoplasm of upper lobe, left bronchus or lung: Secondary | ICD-10-CM

## 2020-05-16 NOTE — Telephone Encounter (Signed)
[place] [facility] CSW Progress Notes  Call to patient to check in, no answer, left VM w my contact information and encouragement to return call.  Edwyna Shell, LCSW Clinical Social Worker Phone:  [phone] Cell:  [phone]

## 2020-05-16 NOTE — Progress Notes (Signed)
[facility] CSW Progress Notes  Check in call w patient - he has started w [facility] for disability, application is in process.  Marguarite Arbour Nyra Capes has qualified him for the [employer].  He was encouraged to submit any allowable expenses for this grant funding.  He has received a $50 gas card from [employer]. He can be enrolled in [facility] - initial $50 disbursement has been left in [employer] for him to collect after his appointment on Monday 6/28.  He reports that his major stress is financial - as he is self-employed, he does not have income when he is unable to work. Wife is disabled and has disability income.  Their household income has been severely strained by his inability to work.  He appreciates any help that we can give while he waits for a decision from [facility] .  He was encouraged to stay in close touch w [facility] for any questions and to track his application w them.  Edwyna Shell, LCSW Clinical Social Worker Phone:  [phone] Cell:  [phone]

## 2020-05-17 ENCOUNTER — Telehealth: Payer: Self-pay | Admitting: *Deleted

## 2020-05-17 ENCOUNTER — Other Ambulatory Visit: Payer: Self-pay | Admitting: Radiation Oncology

## 2020-05-17 MED ORDER — LIDOCAINE VISCOUS HCL 2 % MT SOLN
10.0000 mL | Freq: Four times a day (QID) | OROMUCOSAL | 1 refills | Status: DC | PRN
Start: 2020-05-17 — End: 2020-08-08

## 2020-05-17 NOTE — Telephone Encounter (Signed)
I received a message from [facility] that patient wrote a mychart message saying he his having pain swallowing.  I updated Dr. Julien Nordmann and he would like radiation to take care of this.  I called patient to see if Rad Onc called and he said no.  I gave him Dr. Ida Rogue nurse phone number to call for help.

## 2020-05-20 ENCOUNTER — Other Ambulatory Visit: Payer: Self-pay

## 2020-05-20 ENCOUNTER — Other Ambulatory Visit: Payer: Self-pay | Admitting: Radiation Oncology

## 2020-05-20 ENCOUNTER — Inpatient Hospital Stay: Payer: 59

## 2020-05-20 DIAGNOSIS — C3412 Malignant neoplasm of upper lobe, left bronchus or lung: Secondary | ICD-10-CM

## 2020-05-20 DIAGNOSIS — Z5112 Encounter for antineoplastic immunotherapy: Secondary | ICD-10-CM | POA: Diagnosis not present

## 2020-05-20 LAB — CMP (CANCER CENTER ONLY)
ALT: 27 U/L (ref 0–44)
AST: 22 U/L (ref 15–41)
Albumin: 3.5 g/dL (ref 3.5–5.0)
Alkaline Phosphatase: 75 U/L (ref 38–126)
Anion gap: 13 (ref 5–15)
BUN: 13 mg/dL (ref 6–20)
CO2: 25 mmol/L (ref 22–32)
Calcium: 9.7 mg/dL (ref 8.9–10.3)
Chloride: 100 mmol/L (ref 98–111)
Creatinine: 0.82 mg/dL (ref 0.61–1.24)
GFR, Est AFR Am: >60 mL/min (ref >60)
GFR, Estimated: >60 mL/min (ref >60)
Glucose, Bld: 115 mg/dL — ABNORMAL HIGH (ref 70–99)
Potassium: 3.7 mmol/L (ref 3.5–5.1)
Sodium: 138 mmol/L (ref 135–145)
Total Bilirubin: 0.4 mg/dL (ref 0.3–1.2)
Total Protein: 7.7 g/dL (ref 6.5–8.1)

## 2020-05-20 LAB — CBC WITH DIFFERENTIAL (CANCER CENTER ONLY)
Abs Immature Granulocytes: 0.01 10*3/uL (ref 0.00–0.07)
Basophils Absolute: 0 10*3/uL (ref 0.0–0.1)
Basophils Relative: 1 %
Eosinophils Absolute: 0 10*3/uL (ref 0.0–0.5)
Eosinophils Relative: 2 %
HCT: 34.6 % — ABNORMAL LOW (ref 39.0–52.0)
Hemoglobin: 12.2 g/dL — ABNORMAL LOW (ref 13.0–17.0)
Immature Granulocytes: 1 %
Lymphocytes Relative: 31 %
Lymphs Abs: 0.6 10*3/uL — ABNORMAL LOW (ref 0.7–4.0)
MCH: 33.1 pg (ref 26.0–34.0)
MCHC: 35.3 g/dL (ref 30.0–36.0)
MCV: 93.8 fL (ref 80.0–100.0)
Monocytes Absolute: 0.4 10*3/uL (ref 0.1–1.0)
Monocytes Relative: 22 %
Neutro Abs: 0.8 10*3/uL — ABNORMAL LOW (ref 1.7–7.7)
Neutrophils Relative %: 43 %
Platelet Count: 123 10*3/uL — ABNORMAL LOW (ref 150–400)
RBC: 3.69 MIL/uL — ABNORMAL LOW (ref 4.22–5.81)
RDW: 11.8 % (ref 11.5–15.5)
WBC Count: 1.8 10*3/uL — ABNORMAL LOW (ref 4.0–10.5)
nRBC: 0 % (ref 0.0–0.2)

## 2020-05-20 MED ORDER — HYDROCODONE-ACETAMINOPHEN 7.5-325 MG/15ML PO SOLN: 10.0000 mL | Freq: Four times a day (QID) | ORAL | 0 refills | Status: DC | PRN

## 2020-05-21 ENCOUNTER — Telehealth: Payer: Self-pay | Admitting: *Deleted

## 2020-05-21 NOTE — Telephone Encounter (Signed)
Spoke with the patient to see how he was feeling with the addition of pain medication.  He states he is still having some pain and difficulty with swallowing.  I inquired if he was still using the other medications we recommended and he states he is.  Since he completed his radiation about one week ago, he was encouraged to continue all of the medications we have given him as it will take some time to reap the full benefits.  He verbalized understanding.  We will check in early next week to see how he is feeling.  Will continue to follow as necessary.  Gloriajean Dell. Leonie Green, BSN

## 2020-05-27 LAB — ACID FAST CULTURE WITH REFLEXED SENSITIVITIES (MYCOBACTERIA): Acid Fast Culture: NEGATIVE

## 2020-05-28 ENCOUNTER — Other Ambulatory Visit: Payer: 59

## 2020-05-28 ENCOUNTER — Ambulatory Visit: Payer: 59

## 2020-05-28 ENCOUNTER — Ambulatory Visit: Payer: 59 | Admitting: Internal Medicine

## 2020-05-28 NOTE — Progress Notes (Signed)
Pharmacist Chemotherapy Monitoring - Follow Up Assessment    I verify that I have reviewed each item in the below checklist:  . Regimen for the patient is scheduled for the appropriate day and plan matches scheduled date. Marland Kitchen Appropriate non-routine labs are ordered dependent on drug ordered. . If applicable, additional medications reviewed and ordered per protocol based on lifetime cumulative doses and/or treatment regimen.   Plan for follow-up and/or issues identified: No . I-vent associated with next due treatment: No . MD and/or nursing notified: No  Richard Santos 05/28/2020 11:26 AM

## 2020-05-29 ENCOUNTER — Telehealth: Payer: Self-pay | Admitting: General Practice

## 2020-05-29 ENCOUNTER — Ambulatory Visit: Payer: 59

## 2020-05-29 NOTE — Telephone Encounter (Signed)
[facility] CSW Progress Notes  Call from patient, he is concerned about his medical bills and when/how to pay.  He is waiting for disability income.  Tried to return call - his number was not operative at this time.  Will try again later.  Edwyna Shell, LCSW Clinical Social Worker Phone:  [phone]

## 2020-05-30 ENCOUNTER — Other Ambulatory Visit: Payer: Self-pay | Admitting: Radiology

## 2020-05-30 ENCOUNTER — Ambulatory Visit: Payer: 59

## 2020-05-30 ENCOUNTER — Other Ambulatory Visit: Payer: Self-pay | Admitting: Student

## 2020-05-30 ENCOUNTER — Encounter: Payer: Self-pay | Admitting: General Practice

## 2020-05-30 NOTE — Progress Notes (Signed)
[facility] CSW Progress Notes  Call from patient - he is requesting a Engineer, building services card and will pick it up at [employer] approx 10:15 on Mon July 12.  He also expresses concern about his medical bills, was referred to [facility] for any available help from their case management services.  Edwyna Shell, LCSW Clinical Social Worker Phone:  [phone]

## 2020-05-31 ENCOUNTER — Ambulatory Visit (HOSPITAL_COMMUNITY)
Admission: RE | Admit: 2020-05-31 | Discharge: 2020-05-31 | Disposition: A | Discharge status: Home / Self Care | Payer: 59 | Source: Ambulatory Visit | Attending: Internal Medicine | Admitting: Internal Medicine

## 2020-05-31 ENCOUNTER — Other Ambulatory Visit: Payer: Self-pay

## 2020-05-31 ENCOUNTER — Other Ambulatory Visit: Payer: Self-pay | Admitting: Internal Medicine

## 2020-05-31 DIAGNOSIS — Z79899 Other long term (current) drug therapy: Secondary | ICD-10-CM | POA: Insufficient documentation

## 2020-05-31 DIAGNOSIS — I10 Essential (primary) hypertension: Secondary | ICD-10-CM | POA: Insufficient documentation

## 2020-05-31 DIAGNOSIS — C3490 Malignant neoplasm of unspecified part of unspecified bronchus or lung: Secondary | ICD-10-CM | POA: Diagnosis present

## 2020-05-31 DIAGNOSIS — K219 Gastro-esophageal reflux disease without esophagitis: Secondary | ICD-10-CM | POA: Diagnosis not present

## 2020-05-31 DIAGNOSIS — I878 Other specified disorders of veins: Secondary | ICD-10-CM

## 2020-05-31 DIAGNOSIS — F17210 Nicotine dependence, cigarettes, uncomplicated: Secondary | ICD-10-CM | POA: Insufficient documentation

## 2020-05-31 DIAGNOSIS — E785 Hyperlipidemia, unspecified: Secondary | ICD-10-CM | POA: Insufficient documentation

## 2020-05-31 DIAGNOSIS — Z7982 Long term (current) use of aspirin: Secondary | ICD-10-CM | POA: Insufficient documentation

## 2020-05-31 DIAGNOSIS — F419 Anxiety disorder, unspecified: Secondary | ICD-10-CM | POA: Diagnosis not present

## 2020-05-31 DIAGNOSIS — Z8546 Personal history of malignant neoplasm of prostate: Secondary | ICD-10-CM | POA: Insufficient documentation

## 2020-05-31 DIAGNOSIS — K5730 Diverticulosis of large intestine without perforation or abscess without bleeding: Secondary | ICD-10-CM | POA: Insufficient documentation

## 2020-05-31 HISTORY — PX: IR IMAGING GUIDED PORT INSERTION: IMG5740

## 2020-05-31 MED ORDER — FENTANYL CITRATE (PF) 100 MCG/2ML IJ SOLN
INTRAMUSCULAR | Status: AC
  Filled 2020-05-31: qty 2

## 2020-05-31 MED ORDER — MIDAZOLAM HCL 2 MG/2ML IJ SOLN
INTRAMUSCULAR | Status: AC | PRN
  Administered 2020-05-31 (×2): 0.5 mg via INTRAVENOUS

## 2020-05-31 MED ORDER — MIDAZOLAM HCL 2 MG/2ML IJ SOLN
INTRAMUSCULAR | Status: AC
  Filled 2020-05-31: qty 2

## 2020-05-31 MED ORDER — FENTANYL CITRATE (PF) 100 MCG/2ML IJ SOLN
INTRAMUSCULAR | Status: AC | PRN
  Administered 2020-05-31 (×2): 25 ug via INTRAVENOUS

## 2020-05-31 MED ORDER — LIDOCAINE-EPINEPHRINE 1 %-1:100000 IJ SOLN
INTRAMUSCULAR | Status: AC | PRN
  Administered 2020-05-31: 20 mL

## 2020-05-31 MED ORDER — LIDOCAINE-EPINEPHRINE 1 %-1:100000 IJ SOLN
INTRAMUSCULAR | Status: AC
  Filled 2020-05-31: qty 1

## 2020-05-31 MED ORDER — SODIUM CHLORIDE 0.9 % IV SOLN: INTRAVENOUS | Status: DC

## 2020-05-31 MED ORDER — HEPARIN SOD (PORK) LOCK FLUSH 100 UNIT/ML IV SOLN
INTRAVENOUS | Status: AC | PRN
  Administered 2020-05-31: 500 [IU] via INTRAVENOUS

## 2020-05-31 MED ORDER — CEFAZOLIN SODIUM-DEXTROSE 2-4 GM/100ML-% IV SOLN
INTRAVENOUS | Status: AC
  Administered 2020-05-31: 2000 mg
  Filled 2020-05-31: qty 100

## 2020-05-31 MED ORDER — HEPARIN SOD (PORK) LOCK FLUSH 100 UNIT/ML IV SOLN
INTRAVENOUS | Status: AC
  Filled 2020-05-31: qty 5

## 2020-05-31 NOTE — Procedures (Signed)
  Procedure: R IJ port catheter placement    EBL:   minimal Complications:  none immediate  See full dictation in [employer].  Dillard Cannon MD Main # [phone] Pager  [phone]

## 2020-05-31 NOTE — H&P (Signed)
Chief Complaint: Patient was seen in consultation today for tunneled central venous catheter with port placement.  Referring Physician(s): Richard Santos,Richard Santos  Supervising Physician: Richard Santos  Patient Status: [facility] - Out-pt  History of Present Illness: Richard Santos is Santos 59 y.o. male with Santos past medical history significant for anxiety, DDD, GERD, HTN, HLD, prostate cancer s/p seed implants, tobacco use and small cell lung cancer followed by Richard Santos who presents today for Santos tunneled central venous catheter with port placement. Richard Santos experienced several syncopal episodes earlier this year and on 04/11/20 he c/o chest pain and dizziness. He was brought to the ED and imaging showed Santos left hilar/AP window mass measuring 4.7 x 4.5 x 4.8 cm which invaded the mediastinum and left hilum with encasement of the left upper lobe pulmonary arteries with significant luminal narrowing as well as encasement of the distal left mainstem bronchus and its branches. He underwent an MRI of the brain that same day which showed Santos solitary 0.6 cm enhancing mass in the inferior left cerebellum compatible with Santos solitary brain metastasis. Santos bronchoscopy was performed on 5/21 and pathology showed small cell carcinoma. He was referred to oncology and decision was made to proceed with palliative chemotherapy. IR has been asked to place Santos port to facilitate systemic therapy.  Richard Santos reports he has been feeling ok except for some pain at his radiation site. He denies any other complaints. His appetite has been good and he is looking forward to eating after the procedure. He reports needing an enema last night due to constipation after starting opioids. He states understanding of the requested procedure and is agreeable to proceed.  Past Medical History:  Diagnosis Date  . Allergic rhinitis   . Allergy   . Anxiety   . Chronic low back pain   . DDD (degenerative disc disease), lumbar   . ED (erectile  dysfunction)   . Finger injury    cut pads off 3rd and 4th finger left hand/due to lawn mower accident  . GERD (gastroesophageal reflux disease)   . Heart murmur    as child only-   . History of kidney stones   . HTN (hypertension) 08/20/2016  . Hyperlipidemia   . Incomplete right bundle branch block   . Prostate cancer ([place]) dx 10/02/14   stage T1c  . Prostate cancer ([place])   . Sigmoid diverticulosis   . Wears glasses     Past Surgical History:  Procedure Laterality Date  . BRONCHIAL BIOPSY  04/12/2020   Procedure: BRONCHIAL BIOPSIES;  Surgeon: Richard Garfinkel, MD;  Location: [place];  Service: Cardiopulmonary;;  . BRONCHIAL BRUSHINGS  04/12/2020   Procedure: BRONCHIAL BRUSHINGS;  Surgeon: Richard Garfinkel, MD;  Location: [place];  Service: Cardiopulmonary;;  . BRONCHIAL NEEDLE ASPIRATION BIOPSY  04/12/2020   Procedure: BRONCHIAL NEEDLE ASPIRATION BIOPSIES;  Surgeon: Richard Garfinkel, MD;  Location: [place];  Service: Cardiopulmonary;;  . BRONCHIAL WASHINGS  04/12/2020   Procedure: BRONCHIAL WASHINGS;  Surgeon: Richard Garfinkel, MD;  Location: [facility];  Service: Cardiopulmonary;;  . COLONOSCOPY    . COLONOSCOPY W/ POLYPECTOMY  04-19-2012  . ENDOBRONCHIAL ULTRASOUND N/Santos 04/12/2020   Procedure: ENDOBRONCHIAL ULTRASOUND;  Surgeon: Richard Garfinkel, MD;  Location: [place];  Service: Cardiopulmonary;  Laterality: N/Santos;  . ESOPHAGOGASTRODUODENOSCOPY  08-05-2010  . HEMOSTASIS CONTROL  04/12/2020   Procedure: HEMOSTASIS CONTROL;  Surgeon: Richard Garfinkel, MD;  Location: [facility];  Service: Cardiopulmonary;;  . POLYPECTOMY    . PROSTATE BIOPSY  10/02/14  .  RADIOACTIVE SEED IMPLANT N/Santos 01/30/2015   Procedure: RADIOACTIVE SEED IMPLANT;  Surgeon: Richard Snare, MD;  Location: [facility];  Service: Urology;  Laterality: N/Santos;  . UPPER GASTROINTESTINAL ENDOSCOPY    . VIDEO BRONCHOSCOPY N/Santos 04/12/2020   Procedure: VIDEO BRONCHOSCOPY WITHOUT FLUORO;  Surgeon:  Richard Garfinkel, MD;  Location: [facility];  Service: Cardiopulmonary;  Laterality: N/Santos;    Allergies: Bee venom  Medications: Prior to Admission medications   Medication Sig Start Date End Date Taking? Authorizing Provider  acetaminophen (TYLENOL) 325 MG tablet Take 2 tablets (650 mg total) by mouth every 6 (six) hours as needed for mild pain (or Fever >/= 101). 04/12/20  Yes Santos, Richard A, MD  aspirin EC 81 MG tablet Take 81 mg by mouth daily. Swallow whole.   Yes [provider]  atorvastatin (LIPITOR) 40 MG tablet Take 1 tablet (40 mg total) by mouth daily. 12/15/19  Yes [name], MD  busPIRone (BUSPAR) 10 MG tablet Take 1 tablet (10 mg total) by mouth 2 (two) times daily. 08/16/19  Yes [name], MD  HYDROcodone-acetaminophen (HYCET) 7.5-325 mg/15 ml solution Take 10 mLs by mouth 4 (four) times daily as needed for moderate pain. 05/20/20  Yes Richard Pedro, PA-C  lidocaine (XYLOCAINE) 2 % solution Use as directed 10 mLs in the mouth or throat every 6 (six) hours as needed for mouth pain. 05/17/20  Yes Kyung Rudd, MD  meloxicam (MOBIC) 15 MG tablet TAKE 1 TABLET BY MOUTH EVERY DAY AS NEEDED FOR PAIN Patient taking differently: Take 15 mg by mouth daily as needed for pain.  04/28/20  Yes [name], MD  Multiple Vitamin (MULTIVITAMIN) capsule Take 1 capsule by mouth daily.   Yes [provider]  nicotine (NICODERM CQ - DOSED IN MG/24 HOURS) 21 mg/24hr patch Place 1 patch (21 mg total) onto the skin daily. 04/12/20  Yes Santos, Richard A, MD  nicotine polacrilex (COMMIT) 4 MG lozenge Take 1 lozenge (4 mg total) by mouth as needed for smoking cessation. 04/26/20  Yes Lauraine Rinne, NP  pantoprazole (PROTONIX) 40 MG tablet Take 1 tablet (40 mg total) by mouth daily before breakfast. 08/16/19  Yes [name], MD  prochlorperazine (COMPAZINE) 10 MG tablet Take 1 tablet (10 mg total) by mouth every 6 (six) hours as needed for nausea or vomiting. 04/18/20  Yes  Curt Bears, MD  traMADol (ULTRAM) 50 MG tablet TAKE HALF TABLET BY MOUTH 2 TIMES DAILY AS NEEDED FOR PAIN Patient taking differently: Take 25 mg by mouth every 12 (twelve) hours as needed for moderate pain. TAKE HALF TABLET BY MOUTH 2 TIMES DAILY AS NEEDED FOR PAIN 05/14/20  Yes [name], MD  EPINEPHrine (EPIPEN) 0.3 mg/0.3 mL IJ SOAJ injection Inject 0.3 mLs (0.3 mg total) into the muscle once. 07/31/14   [name], MD  folic acid (FOLVITE) 1 MG tablet Take 1 tablet (1 mg total) by mouth daily. Patient not taking: Reported on 05/24/2020 04/13/20   Santos, Jerald Kief A, MD  losartan (COZAAR) 50 MG tablet Take 1 tablet (50 mg total) by mouth daily. Patient not taking: Reported on 05/24/2020 04/03/20   [name], MD  nicotine (NICODERM CQ) 14 mg/24hr patch Place 1 patch (14 mg total) onto the skin daily. 04/26/20   Lauraine Rinne, NP  sucralfate (CARAFATE) 1 g tablet Take 1 tablet (1 g total) by mouth 4 (four) times daily. Dissolve each tablet in 15 cc water before  use. Patient not taking: Reported on 05/24/2020 05/10/20   Kyung Rudd, MD     Family History  Problem Relation Age of Onset  . Prostate cancer Father   . Heart disease Father   . Heart disease Mother   . Breast cancer Sister   . Heart disease Other   . Colon cancer Neg Hx   . Colon polyps Neg Hx   . Esophageal cancer Neg Hx   . Rectal cancer Neg Hx   . Stomach cancer Neg Hx     Social History   Socioeconomic History  . Marital status: Married    Spouse name: Not on file  . Number of children: 0  . Years of education: Not on file  . Highest education level: Not on file  Occupational History  . Occupation: Counselling psychologist: [employer]  Tobacco Use  . Smoking status: Current Every Day Smoker    Packs/day: 1.00    Years: 25.00    Pack years: 25.00    Types: Cigarettes  . Smokeless tobacco: Never Used  Vaping Use  . Vaping Use: Former  Substance and Sexual Activity  . Alcohol use: Yes     Alcohol/week: 49.0 standard drinks    Types: 25 Cans of beer, 24 Standard drinks or equivalent per week    Comment: 5 beer per day  . Drug use: No  . Sexual activity: Not on file  Other Topics Concern  . 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 He is married with no biological children.   Social Determinants of Health   Financial Resource Strain:   . Difficulty of Paying Living Expenses:   Food Insecurity:   . Worried About Charity fundraiser in the Last Year:   . Arboriculturist in the Last Year:   Transportation Needs:   . Film/video editor (Medical):   Marland Kitchen Lack of Transportation (Non-Medical):   Physical Activity:   . Days of Exercise per Week:   . Minutes of Exercise per Session:   Stress:   . Feeling of Stress :   Social Connections:   . Frequency of Communication with Friends and Family:   . Frequency of Social Gatherings with Friends and Family:   . Attends Religious Services:   . Active Member of Clubs or Organizations:   . Attends Archivist Meetings:   Marland Kitchen Marital Status:      Review of Systems: Santos 12 point ROS discussed and pertinent positives are indicated in the HPI above.  All other systems are negative.  Review of Systems  Constitutional: Negative for appetite change, chills and fever.  Respiratory: Negative for cough and shortness of breath.   Cardiovascular: Negative for chest pain.  Gastrointestinal: Positive for constipation. Negative for abdominal pain, blood in stool, diarrhea, nausea and vomiting.  Genitourinary: Negative for hematuria.  Musculoskeletal: Negative for back pain.  Neurological: Negative for dizziness and headaches.    Vital Signs: BP 97/71   Pulse 85   Temp 97.7 F (36.5 C) (Oral)   Resp 14   Ht 6' (1.829 m)   Wt 155 lb (70.3 kg)   SpO2 100%   BMI 21.02 kg/m   Physical Exam Vitals reviewed.  Constitutional:      General: He is not in acute  distress. HENT:     Head: Normocephalic.     Mouth/Throat:  Mouth: Mucous membranes are moist.     Pharynx: Oropharynx is clear. No oropharyngeal exudate or posterior oropharyngeal erythema.  Cardiovascular:     Rate and Rhythm: Normal rate and regular rhythm.  Pulmonary:     Effort: Pulmonary effort is normal.     Breath sounds: Normal breath sounds.  Abdominal:     General: There is no distension.     Palpations: Abdomen is soft.     Tenderness: There is no abdominal tenderness.  Skin:    General: Skin is warm and dry.  Neurological:     Mental Status: He is alert and oriented to person, place, and time.  Psychiatric:        Mood and Affect: Mood normal.        Behavior: Behavior normal.        Thought Content: Thought content normal.        Judgment: Judgment normal.      MD Evaluation Airway: WNL Heart: WNL Abdomen: WNL Chest/ Lungs: WNL ASA  Classification: 3 Mallampati/Airway Score: Two   Imaging: NM PET Image Initial (PI) Skull Base To Thigh  Result Date: 05/02/2020 CLINICAL DATA:  Initial treatment strategy for small cell lung cancer. EXAM: NUCLEAR MEDICINE PET SKULL BASE TO THIGH TECHNIQUE: 8.02 mCi F-18 FDG was injected intravenously. Full-ring PET imaging was performed from the skull base to thigh after the radiotracer. CT data was obtained and used for attenuation correction and anatomic localization. Fasting blood glucose: 117 mg/dl COMPARISON:  CT chest of Apr 11, 2020 FINDINGS: Mediastinal blood pool activity: SUV max 2.32 Liver activity: SUV max NA NECK: No hypermetabolic lymph nodes in the neck. Incidental CT findings: Atheromatous plaque throughout the carotid arteries. CHEST: Hypermetabolic areas in the LEFT chest corresponding to LEFT upper lobe mass and LEFT hilar mass. Anterior LEFT upper lobe nodularity also with increased FDG uptake. No thoracic inlet hypermetabolic lymph nodes. No contralateral hilar involvement or additional mediastinal involvement  (Image 65, series 4) mixed density area with peripheral soft tissue density in the LEFT upper lobe measuring 3.3 x 2.2 cm, area of ground-glass seen along the medial aspect of this location on the previous study measured approximately 2 x 1.3 cm. (SUVmax = 6.2) Mediastinal and juxta hilar nodal mass (SUVmax = 8.6 (image 76 of series 4 currently measuring approximately 2.8 by 2.3 cm greatest dimension, previously approximately 4.4 x 2.4 cm when measured in Santos similar fashion. Decreased hilar fullness along the anterior aspect of the LEFT upper lobe bronchus 19 mm thickness as compared to 2.9 cm. Area of nodularity along the anterior LEFT chest was present previously open parent image 36, series 8) 1.4 x 0.8 cm previously approximately 1.7 x 0.9 cm (SUVmax = 4.8) Scattered areas of ground-glass and nodularity in the LEFT upper lobe elsewhere and signs of apical scarring are unchanged. Background pulmonary emphysema as before. Incidental CT findings: Calcified coronary artery disease. Normal heart size. No adenopathy by size criteria in the chest. No chest wall lesion. ABDOMEN/PELVIS: No abnormal hypermetabolic activity within the liver, pancreas, adrenal glands, or spleen. No hypermetabolic lymph nodes in the abdomen or pelvis. Incidental CT findings: Brachytherapy seeds in the prostate. No acute gastrointestinal process. Atheromatous plaque of the abdominal aorta. SKELETON: No focal hypermetabolic activity to suggest skeletal metastasis. Incidental CT findings: Spinal degenerative changes without acute or destructive bone process. IMPRESSION: 1. Diminished size of LEFT juxta hilar mass with metabolic activity as described in this patient with known small cell lung cancer. 2. Area of  mixed density in the LEFT upper lobe which encompasses the site of previous nodularity perhaps Santos combination of post radiation change and tumor. Correlate with any respiratory symptoms. Attention on follow-up. 3. Decreasing size of nodule  along the anterior LEFT chest. Subtle ground-glass surrounding around this area may indicate treatment as well. This also shows increased metabolic activity and is suspicious for disease. 4. Aortic atherosclerosis, signs of prior prostate cancer with brachytherapy seeds. Signs of pulmonary emphysema as outlined above. Aortic Atherosclerosis (ICD10-I70.0) and Emphysema (ICD10-J43.9). Electronically Signed   By: Zetta Bills M.D.   On: 05/02/2020 15:21    Labs:  CBC: Recent Labs    04/18/20 1429 05/06/20 1030 05/13/20 1120 05/20/20 1132  WBC 8.8 7.5 6.2 1.8*  HGB 14.2 12.9* 13.9 12.2*  HCT 41.1 37.8* 39.9 34.6*  PLT 260 267 229 123*    COAGS: Recent Labs    04/11/20 1016  INR 1.0    BMP: Recent Labs    04/18/20 1429 05/06/20 1030 05/13/20 1120 05/20/20 1132  NA 139 141 137 138  K 4.2 4.2 4.3 3.7  CL 102 105 100 100  CO2 26 25 27 25   GLUCOSE 113* 142* 111* 115*  BUN 13 9 16 13   CALCIUM 9.5 10.0 10.1 9.7  CREATININE 0.92 0.80 0.91 0.82  GFRNONAA >60 >60 >60 >60  GFRAA >60 >60 >60 >60    LIVER FUNCTION TESTS: Recent Labs    04/18/20 1429 05/06/20 1030 05/13/20 1120 05/20/20 1132  BILITOT 0.3 0.5 0.5 0.4  AST 33 21 26 22   ALT 44 26 41 27  ALKPHOS 59 69 84 75  PROT 8.0 7.4 7.9 7.7  ALBUMIN 4.4 3.4* 3.7 3.5    TUMOR MARKERS: No results for input(s): AFPTM, CEA, CA199, CHROMGRNA in the last 8760 hours.  Assessment and Plan:  59 y/o M with recently diagnosed small cell lung cancer who presents today Santos tunneled central venous catheter with port placement to begin palliative chemotherapy.   Patient has been NPO since midnight, no current anticoagulation/antiplatelet medications.   Risks and benefits of image-guided Port-Santos-catheter placement were discussed with the patient including, but not limited to bleeding, infection, pneumothorax, or fibrin sheath development and need for additional procedures.  All of the patient's questions were answered, patient is  agreeable to proceed.  Consent signed and in chart.  Thank you for this interesting consult.  I greatly enjoyed meeting Richard Santos and look forward to participating in their care.  Santos copy of this report was sent to the requesting provider on this date.  Electronically Signed: Joaquim Nam, PA-C 05/31/2020, 8:04 AM   I spent Santos total of 30 Minutes   in face to face in clinical consultation, greater than 50% of which was counseling/coordinating care for tunneled central venous catheter with port placement.

## 2020-05-31 NOTE — Discharge Instructions (Addendum)
Implanted Port Insertion, Care After This sheet gives you information about how to care for yourself after your procedure. Your health care provider may also give you more specific instructions. If you have problems or questions, contact your health care provider. What can I expect after the procedure? After the procedure, it is common to have:  Discomfort at the port insertion site.  Bruising on the skin over the port. This should improve over 3-4 days. Follow these instructions at home: [facility] care  After your port is placed, you will get a manufacturer's information card. The card has information about your port. Keep this card with you at all times.  Take care of the port as told by your health care provider. Ask your health care provider if you or a family member can get training for taking care of the port at home. A home health care nurse may also take care of the port.  Make sure to remember what type of port you have. Incision care      Follow instructions from your health care provider about how to take care of your port insertion site. Make sure you: ? Wash your hands with soap and water before and after you change your bandage (dressing). If soap and water are not available, use hand sanitizer. ? Remove your dressing as told by your health care provider. In 24-48 hours ? Leave stitches (sutures), skin glue, or adhesive strips in place. These skin closures may need to stay in place for 2 weeks or longer. If adhesive strip edges start to loosen and curl up, you may trim the loose edges. Do not remove adhesive strips completely unless your health care provider tells you to do that.  Check your port insertion site every day for signs of infection. Check for: ? Redness, swelling, or pain. ? Fluid or blood. ? Warmth. ? Pus or a bad smell. Activity  Return to your normal activities as told by your health care provider. Ask your health care provider what activities are safe for  you.  Do not lift anything that is heavier than 10 lb (4.5 kg), or the limit that you are told, until your health care provider says that it is safe. General instructions  Take over-the-counter and prescription medicines only as told by your health care provider.  Do not take baths, swim, or use a hot tub until your health care provider approves. Ask your health care provider if you may take showers. You may only be allowed to take sponge baths.  Do not drive for 24 hours if you were given a sedative during your procedure.  Wear a medical alert bracelet in case of an emergency. This will tell any health care providers that you have a port.  Keep all follow-up visits as told by your health care provider. This is important. Contact a health care provider if:  You cannot flush your port with saline as directed, or you cannot draw blood from the port.  You have a fever or chills.  You have redness, swelling, or pain around your port insertion site.  You have fluid or blood coming from your port insertion site.  Your port insertion site feels warm to the touch.  You have pus or a bad smell coming from the port insertion site. Get help right away if:  You have chest pain or shortness of breath.  You have bleeding from your port that you cannot control. Summary  Take care of the port as told  by your health care provider. Keep the manufacturer's information card with you at all times.  Change your dressing as told by your health care provider.  Contact a health care provider if you have a fever or chills or if you have redness, swelling, or pain around your port insertion site.  Keep all follow-up visits as told by your health care provider. This information is not intended to replace advice given to you by your health care provider. Make sure you discuss any questions you have with your health care provider. Document Revised: 06/07/2018 Document Reviewed: 06/07/2018 Elsevier  Patient Education  [place].  Moderate Conscious Sedation, Adult, Care After These instructions provide you with information about caring for yourself after your procedure. Your health care provider may also give you more specific instructions. Your treatment has been planned according to current medical practices, but problems sometimes occur. Call your health care provider if you have any problems or questions after your procedure. What can I expect after the procedure? After your procedure, it is common:  To feel sleepy for several hours.  To feel clumsy and have poor balance for several hours.  To have poor judgment for several hours.  To vomit if you eat too soon. Follow these instructions at home: For at least 24 hours after the procedure:   Do not: ? Participate in activities where you could fall or become injured. ? Drive. ? Use heavy machinery. ? Drink alcohol. ? Take sleeping pills or medicines that cause drowsiness. ? Make important decisions or sign legal documents. ? Take care of children on your own.  Rest. Eating and drinking  Follow the diet recommended by your health care provider.  If you vomit: ? Drink water, juice, or soup when you can drink without vomiting. ? Make sure you have little or no nausea before eating solid foods. General instructions  Have a responsible adult stay with you until you are awake and alert.  Take over-the-counter and prescription medicines only as told by your health care provider.  If you smoke, do not smoke without supervision.  Keep all follow-up visits as told by your health care provider. This is important. Contact a health care provider if:  You keep feeling nauseous or you keep vomiting.  You feel light-headed.  You develop a rash.  You have a fever. Get help right away if:  You have trouble breathing. This information is not intended to replace advice given to you by your health care provider. Make  sure you discuss any questions you have with your health care provider. Document Revised: 10/22/2017 Document Reviewed: 02/29/2016 Elsevier Patient Education  2020 [employer].

## 2020-06-01 ENCOUNTER — Other Ambulatory Visit: Payer: Self-pay | Admitting: Radiation Oncology

## 2020-06-03 ENCOUNTER — Inpatient Hospital Stay: Payer: 59

## 2020-06-03 ENCOUNTER — Other Ambulatory Visit: Payer: Self-pay

## 2020-06-03 ENCOUNTER — Encounter: Payer: Self-pay | Admitting: Internal Medicine

## 2020-06-03 ENCOUNTER — Inpatient Hospital Stay: Payer: 59 | Attending: Internal Medicine | Admitting: Medical

## 2020-06-03 ENCOUNTER — Inpatient Hospital Stay (HOSPITAL_BASED_OUTPATIENT_CLINIC_OR_DEPARTMENT_OTHER): Payer: 59 | Admitting: Internal Medicine

## 2020-06-03 VITALS — BP 111/73 | HR 77 | Temp 98.2°F | Resp 17

## 2020-06-03 VITALS — BP 106/71 | HR 89 | Temp 97.2°F | Resp 17 | Ht 72.0 in | Wt 155.5 lb

## 2020-06-03 DIAGNOSIS — R350 Frequency of micturition: Secondary | ICD-10-CM

## 2020-06-03 DIAGNOSIS — Z5111 Encounter for antineoplastic chemotherapy: Secondary | ICD-10-CM | POA: Insufficient documentation

## 2020-06-03 DIAGNOSIS — C3490 Malignant neoplasm of unspecified part of unspecified bronchus or lung: Secondary | ICD-10-CM

## 2020-06-03 DIAGNOSIS — F419 Anxiety disorder, unspecified: Secondary | ICD-10-CM | POA: Insufficient documentation

## 2020-06-03 DIAGNOSIS — C3412 Malignant neoplasm of upper lobe, left bronchus or lung: Secondary | ICD-10-CM

## 2020-06-03 DIAGNOSIS — F17210 Nicotine dependence, cigarettes, uncomplicated: Secondary | ICD-10-CM | POA: Diagnosis not present

## 2020-06-03 DIAGNOSIS — N179 Acute kidney failure, unspecified: Secondary | ICD-10-CM | POA: Diagnosis not present

## 2020-06-03 DIAGNOSIS — Z7982 Long term (current) use of aspirin: Secondary | ICD-10-CM | POA: Insufficient documentation

## 2020-06-03 DIAGNOSIS — R300 Dysuria: Secondary | ICD-10-CM

## 2020-06-03 DIAGNOSIS — E785 Hyperlipidemia, unspecified: Secondary | ICD-10-CM | POA: Insufficient documentation

## 2020-06-03 DIAGNOSIS — Z79899 Other long term (current) drug therapy: Secondary | ICD-10-CM | POA: Diagnosis not present

## 2020-06-03 DIAGNOSIS — Z8546 Personal history of malignant neoplasm of prostate: Secondary | ICD-10-CM | POA: Insufficient documentation

## 2020-06-03 DIAGNOSIS — I10 Essential (primary) hypertension: Secondary | ICD-10-CM | POA: Diagnosis not present

## 2020-06-03 DIAGNOSIS — Z95828 Presence of other vascular implants and grafts: Secondary | ICD-10-CM | POA: Insufficient documentation

## 2020-06-03 DIAGNOSIS — K219 Gastro-esophageal reflux disease without esophagitis: Secondary | ICD-10-CM | POA: Diagnosis not present

## 2020-06-03 DIAGNOSIS — Z5112 Encounter for antineoplastic immunotherapy: Secondary | ICD-10-CM | POA: Insufficient documentation

## 2020-06-03 DIAGNOSIS — C7931 Secondary malignant neoplasm of brain: Secondary | ICD-10-CM | POA: Insufficient documentation

## 2020-06-03 LAB — CMP (CANCER CENTER ONLY)
ALT: 21 U/L (ref 0–44)
AST: 21 U/L (ref 15–41)
Albumin: 3.4 g/dL — ABNORMAL LOW (ref 3.5–5.0)
Alkaline Phosphatase: 59 U/L (ref 38–126)
Anion gap: 7 (ref 5–15)
BUN: 12 mg/dL (ref 6–20)
CO2: 27 mmol/L (ref 22–32)
Calcium: 9.5 mg/dL (ref 8.9–10.3)
Chloride: 106 mmol/L (ref 98–111)
Creatinine: 0.77 mg/dL (ref 0.61–1.24)
GFR, Est AFR Am: >60 mL/min (ref >60)
GFR, Estimated: >60 mL/min (ref >60)
Glucose, Bld: 91 mg/dL (ref 70–99)
Potassium: 3.9 mmol/L (ref 3.5–5.1)
Sodium: 140 mmol/L (ref 135–145)
Total Bilirubin: 0.3 mg/dL (ref 0.3–1.2)
Total Protein: 7 g/dL (ref 6.5–8.1)

## 2020-06-03 LAB — CBC WITH DIFFERENTIAL (CANCER CENTER ONLY)
Abs Immature Granulocytes: 0.04 10*3/uL (ref 0.00–0.07)
Basophils Absolute: 0 10*3/uL (ref 0.0–0.1)
Basophils Relative: 1 %
Eosinophils Absolute: 0.2 10*3/uL (ref 0.0–0.5)
Eosinophils Relative: 3 %
HCT: 32.5 % — ABNORMAL LOW (ref 39.0–52.0)
Hemoglobin: 11.1 g/dL — ABNORMAL LOW (ref 13.0–17.0)
Immature Granulocytes: 1 %
Lymphocytes Relative: 15 %
Lymphs Abs: 1 10*3/uL (ref 0.7–4.0)
MCH: 33.3 pg (ref 26.0–34.0)
MCHC: 34.2 g/dL (ref 30.0–36.0)
MCV: 97.6 fL (ref 80.0–100.0)
Monocytes Absolute: 0.6 10*3/uL (ref 0.1–1.0)
Monocytes Relative: 9 %
Neutro Abs: 4.8 10*3/uL (ref 1.7–7.7)
Neutrophils Relative %: 71 %
Platelet Count: 308 10*3/uL (ref 150–400)
RBC: 3.33 MIL/uL — ABNORMAL LOW (ref 4.22–5.81)
RDW: 13 % (ref 11.5–15.5)
WBC Count: 6.7 10*3/uL (ref 4.0–10.5)
nRBC: 0 % (ref 0.0–0.2)

## 2020-06-03 LAB — TSH: TSH: 0.893 u[IU]/mL (ref 0.320–4.118)

## 2020-06-03 MED ORDER — HEPARIN SOD (PORK) LOCK FLUSH 100 UNIT/ML IV SOLN
500.0000 [IU] | Freq: Once | INTRAVENOUS | Status: AC | PRN
  Administered 2020-06-03: 500 [IU]
  Filled 2020-06-03: qty 5

## 2020-06-03 MED ORDER — SODIUM CHLORIDE 0.9 % IV SOLN
150.0000 mg | Freq: Once | INTRAVENOUS | Status: AC
  Administered 2020-06-03: 150 mg via INTRAVENOUS
  Filled 2020-06-03: qty 150

## 2020-06-03 MED ORDER — SODIUM CHLORIDE 0.9 % IV SOLN
10.0000 mg | Freq: Once | INTRAVENOUS | Status: AC
  Administered 2020-06-03: 10 mg via INTRAVENOUS
  Filled 2020-06-03: qty 10

## 2020-06-03 MED ORDER — DIPHENHYDRAMINE HCL 50 MG/ML IJ SOLN
25.0000 mg | Freq: Once | INTRAMUSCULAR | Status: AC
  Administered 2020-06-03: 25 mg via INTRAVENOUS

## 2020-06-03 MED ORDER — SODIUM CHLORIDE 0.9% FLUSH
10.0000 mL | INTRAVENOUS | Status: DC | PRN
  Administered 2020-06-03: 10 mL
  Filled 2020-06-03: qty 10

## 2020-06-03 MED ORDER — SODIUM CHLORIDE 0.9% FLUSH
10.0000 mL | Freq: Once | INTRAVENOUS | Status: AC
  Administered 2020-06-03: 10 mL
  Filled 2020-06-03: qty 10

## 2020-06-03 MED ORDER — SODIUM CHLORIDE 0.9 % IV SOLN
577.5000 mg | Freq: Once | INTRAVENOUS | Status: AC
  Administered 2020-06-03: 580 mg via INTRAVENOUS
  Filled 2020-06-03: qty 58

## 2020-06-03 MED ORDER — PALONOSETRON HCL INJECTION 0.25 MG/5ML
INTRAVENOUS | Status: AC
  Filled 2020-06-03: qty 5

## 2020-06-03 MED ORDER — SODIUM CHLORIDE 0.9 % IV SOLN
Freq: Once | INTRAVENOUS | Status: AC
  Filled 2020-06-03: qty 250

## 2020-06-03 MED ORDER — DIPHENHYDRAMINE HCL 50 MG/ML IJ SOLN
INTRAMUSCULAR | Status: AC
  Filled 2020-06-03: qty 1

## 2020-06-03 MED ORDER — TRILACICLIB DIHYDROCHLORIDE INJECTION 300 MG
240.0000 mg/m2 | Freq: Once | INTRAVENOUS | Status: AC
  Administered 2020-06-03: 465 mg via INTRAVENOUS
  Filled 2020-06-03: qty 31

## 2020-06-03 MED ORDER — SODIUM CHLORIDE 0.9 % IV SOLN
100.0000 mg/m2 | Freq: Once | INTRAVENOUS | Status: AC
  Administered 2020-06-03: 190 mg via INTRAVENOUS
  Filled 2020-06-03: qty 9.5

## 2020-06-03 MED ORDER — SODIUM CHLORIDE 0.9 % IV SOLN
1500.0000 mg | Freq: Once | INTRAVENOUS | Status: AC
  Administered 2020-06-03: 1500 mg via INTRAVENOUS
  Filled 2020-06-03: qty 30

## 2020-06-03 MED ORDER — PALONOSETRON HCL INJECTION 0.25 MG/5ML
0.2500 mg | Freq: Once | INTRAVENOUS | Status: AC
  Administered 2020-06-03: 0.25 mg via INTRAVENOUS

## 2020-06-03 NOTE — Progress Notes (Signed)
Counts ok will continue Carboplatin dose of 580 mg.

## 2020-06-03 NOTE — Progress Notes (Signed)
[place] [phone]   [phone]  OFFICE PROGRESS NOTE  [name], MD [place] [id number]  DIAGNOSIS: Extensive stage (T2b, N2, M1c) small cell lung cancer presented with left upper lobe pulmonary nodule in addition to left hilar mass with mediastinal invasion and solitary brain metastasis diagnosed in May 2021  PRIOR THERAPY: Palliative radiation to the left hilar and mediastinal lymphadenopathy under the care of Dr. Lisbeth Renshaw.  CURRENT THERAPY:  Palliative systemic chemotherapy with carboplatin for AUC of 5 on day 1, etoposide 100 mg/M2 on days 1, 2 and 3 as well as Imfinzi 1500 mg IV every 3 weeks with the chemotherapy.  First dose of chemotherapy May 06, 2020.  The patient will receive treatment with Cosela on the days of his chemotherapy.  Status post 1 cycle.  INTERVAL HISTORY: Richard Santos 59 y.o. male returns to the clinic today for follow-up visit accompanied by his wife.  The patient is feeling fine today with no concerning complaints except for fatigue as well as odynophagia after the radiotherapy.  He is currently on Viscous Lidocaine as well as oxycodone.  He tried Carafate with no improvement.  He was unable to go to [place] as previously planned because of the hold odynophagia.  He did not lose much weight during this time.  He has no chest pain, shortness of breath, cough or hemoptysis.  He denied having any fever or chills.  He has no nausea, vomiting, diarrhea or constipation.  He has no headache or visual changes.  MEDICAL HISTORY: Past Medical History:  Diagnosis Date   Allergic rhinitis    Allergy    Anxiety    Chronic low back pain    DDD (degenerative disc disease), lumbar    ED (erectile dysfunction)    Finger injury    cut pads off 3rd and 4th finger left hand/due to lawn mower accident   GERD (gastroesophageal reflux disease)    Heart murmur    as child only-    History of kidney  stones    HTN (hypertension) 08/20/2016   Hyperlipidemia    Incomplete right bundle branch block    Prostate cancer ([place]) dx 10/02/14   stage T1c   Prostate cancer ([place])    Sigmoid diverticulosis    Wears glasses     ALLERGIES:  is allergic to bee venom.  MEDICATIONS:  Current Outpatient Medications  Medication Sig Dispense Refill   acetaminophen (TYLENOL) 325 MG tablet Take 2 tablets (650 mg total) by mouth every 6 (six) hours as needed for mild pain (or Fever >/= 101). 30 tablet 0   aspirin EC 81 MG tablet Take 81 mg by mouth daily. Swallow whole.     atorvastatin (LIPITOR) 40 MG tablet Take 1 tablet (40 mg total) by mouth daily. 90 tablet 3   busPIRone (BUSPAR) 10 MG tablet Take 1 tablet (10 mg total) by mouth 2 (two) times daily. 180 tablet 3   EPINEPHrine (EPIPEN) 0.3 mg/0.3 mL IJ SOAJ injection Inject 0.3 mLs (0.3 mg total) into the muscle once. 2 Device 2   folic acid (FOLVITE) 1 MG tablet Take 1 tablet (1 mg total) by mouth daily. (Patient not taking: Reported on 05/24/2020) 30 tablet 0   HYDROcodone-acetaminophen (HYCET) 7.5-325 mg/15 ml solution Take 10 mLs by mouth 4 (four) times daily as needed for moderate pain. 473 mL 0   lidocaine (XYLOCAINE) 2 % solution Use as directed 10 mLs  in the mouth or throat every 6 (six) hours as needed for mouth pain. 450 mL 1   losartan (COZAAR) 50 MG tablet Take 1 tablet (50 mg total) by mouth daily. (Patient not taking: Reported on 05/24/2020) 90 tablet 3   meloxicam (MOBIC) 15 MG tablet TAKE 1 TABLET BY MOUTH EVERY DAY AS NEEDED FOR PAIN (Patient taking differently: Take 15 mg by mouth daily as needed for pain. ) 30 tablet 2   Multiple Vitamin (MULTIVITAMIN) capsule Take 1 capsule by mouth daily.     nicotine (NICODERM CQ - DOSED IN MG/24 HOURS) 21 mg/24hr patch Place 1 patch (21 mg total) onto the skin daily. 28 patch 0   nicotine (NICODERM CQ) 14 mg/24hr patch Place 1 patch (14 mg total) onto the skin daily. 28 patch 3    nicotine polacrilex (COMMIT) 4 MG lozenge Take 1 lozenge (4 mg total) by mouth as needed for smoking cessation. 108 tablet 6   pantoprazole (PROTONIX) 40 MG tablet Take 1 tablet (40 mg total) by mouth daily before breakfast. 90 tablet 3   prochlorperazine (COMPAZINE) 10 MG tablet Take 1 tablet (10 mg total) by mouth every 6 (six) hours as needed for nausea or vomiting. 30 tablet 0   sucralfate (CARAFATE) 1 g tablet Take 1 tablet (1 g total) by mouth 4 (four) times daily. Dissolve each tablet in 15 cc water before use. (Patient not taking: Reported on 05/24/2020) 120 tablet 0   traMADol (ULTRAM) 50 MG tablet TAKE HALF TABLET BY MOUTH 2 TIMES DAILY AS NEEDED FOR PAIN (Patient taking differently: Take 25 mg by mouth every 12 (twelve) hours as needed for moderate pain. TAKE HALF TABLET BY MOUTH 2 TIMES DAILY AS NEEDED FOR PAIN) 60 tablet 2   No current facility-administered medications for this visit.    SURGICAL HISTORY:  Past Surgical History:  Procedure Laterality Date   BRONCHIAL BIOPSY  04/12/2020   Procedure: BRONCHIAL BIOPSIES;  Surgeon: Marshell Garfinkel, MD;  Location: [facility];  Service: Cardiopulmonary;;   BRONCHIAL BRUSHINGS  04/12/2020   Procedure: BRONCHIAL BRUSHINGS;  Surgeon: Marshell Garfinkel, MD;  Location: [facility];  Service: Cardiopulmonary;;   BRONCHIAL NEEDLE ASPIRATION BIOPSY  04/12/2020   Procedure: BRONCHIAL NEEDLE ASPIRATION BIOPSIES;  Surgeon: Marshell Garfinkel, MD;  Location: [facility];  Service: Cardiopulmonary;;   BRONCHIAL WASHINGS  04/12/2020   Procedure: BRONCHIAL WASHINGS;  Surgeon: Marshell Garfinkel, MD;  Location: [facility];  Service: Cardiopulmonary;;   COLONOSCOPY     COLONOSCOPY W/ POLYPECTOMY  04-19-2012   ENDOBRONCHIAL ULTRASOUND N/A 04/12/2020   Procedure: ENDOBRONCHIAL ULTRASOUND;  Surgeon: Marshell Garfinkel, MD;  Location: [facility];  Service: Cardiopulmonary;  Laterality: N/A;   ESOPHAGOGASTRODUODENOSCOPY  08-05-2010   HEMOSTASIS CONTROL   04/12/2020   Procedure: HEMOSTASIS CONTROL;  Surgeon: Marshell Garfinkel, MD;  Location: [facility];  Service: Cardiopulmonary;;   IR IMAGING GUIDED PORT INSERTION  05/31/2020   POLYPECTOMY     PROSTATE BIOPSY  10/02/14   RADIOACTIVE SEED IMPLANT N/A 01/30/2015   Procedure: RADIOACTIVE SEED IMPLANT;  Surgeon: Rana Snare, MD;  Location[facility];  Service: Urology;  Laterality: N/A;   UPPER GASTROINTESTINAL ENDOSCOPY     VIDEO BRONCHOSCOPY N/A 04/12/2020   Procedure: VIDEO BRONCHOSCOPY WITHOUT FLUORO;  Surgeon: Marshell Garfinkel, MD;  Location: [facility];  Service: Cardiopulmonary;  Laterality: N/A;    REVIEW OF SYSTEMS:  A comprehensive review of systems was negative except for: Constitutional: positive for fatigue Gastrointestinal: positive for dysphagia and odynophagia   PHYSICAL EXAMINATION:  General appearance: alert, cooperative, fatigued and no distress Head: Normocephalic, without obvious abnormality, atraumatic Neck: no adenopathy, no JVD, supple, symmetrical, trachea midline and thyroid not enlarged, symmetric, no tenderness/mass/nodules Lymph nodes: Cervical, supraclavicular, and axillary nodes normal. Resp: clear to auscultation bilaterally Back: symmetric, no curvature. ROM normal. No CVA tenderness. Cardio: regular rate and rhythm, S1, S2 normal, no murmur, click, rub or gallop GI: soft, non-tender; bowel sounds normal; no masses,  no organomegaly Extremities: extremities normal, atraumatic, no cyanosis or edema  ECOG PERFORMANCE STATUS: 1 - Symptomatic but completely ambulatory  Blood pressure 106/71, pulse 89, temperature (!) 97.2 F (36.2 C), temperature source Temporal, resp. rate 17, height 6' (1.829 m), weight 155 lb 8 oz (70.5 kg), SpO2 100 %.  LABORATORY DATA: Lab Results  Component Value Date   WBC 6.7 06/03/2020   HGB 11.1 (L) 06/03/2020   HCT 32.5 (L) 06/03/2020   MCV 97.6 06/03/2020   PLT 308 06/03/2020      Chemistry        Component Value Date/Time   NA 138 05/20/2020 1132   K 3.7 05/20/2020 1132   CL 100 05/20/2020 1132   CO2 25 05/20/2020 1132   BUN 13 05/20/2020 1132   CREATININE 0.82 05/20/2020 1132      Component Value Date/Time   CALCIUM 9.7 05/20/2020 1132   ALKPHOS 75 05/20/2020 1132   AST 22 05/20/2020 1132   ALT 27 05/20/2020 1132   BILITOT 0.4 05/20/2020 1132       RADIOGRAPHIC STUDIES: IR IMAGING GUIDED PORT INSERTION  Result Date: 05/31/2020 CLINICAL DATA:  Lung cancer, needs durable venous access for planned treatment regimen EXAM: TUNNELED PORT CATHETER PLACEMENT WITH ULTRASOUND AND FLUOROSCOPIC GUIDANCE FLUOROSCOPY TIME:  6 seconds; less than 1 mGy ANESTHESIA/SEDATION: Intravenous Fentanyl 68mcg and Versed 1mg  were administered as conscious sedation during continuous monitoring of the patient's level of consciousness and physiological / cardiorespiratory status by the radiology RN, with a total moderate sedation time of 17 minutes. TECHNIQUE: The procedure, risks, benefits, and alternatives were explained to the patient. Questions regarding the procedure were encouraged and answered. The patient understands and consents to the procedure. As antibiotic prophylaxis, cefazolin 2 g was ordered pre-procedure and administered intravenously within one hour of incision. Patency of the right IJ vein was confirmed with ultrasound with image documentation. An appropriate skin site was determined. Skin site was marked. Region was prepped using maximum barrier technique including cap and mask, sterile gown, sterile gloves, large sterile sheet, and Chlorhexidine as cutaneous antisepsis. The region was infiltrated locally with 1% lidocaine. Under real-time ultrasound guidance, the right IJ vein was accessed with a 21 gauge micropuncture needle; the needle tip within the vein was confirmed with ultrasound image documentation. Needle was exchanged over a 018 guidewire for transitional dilator, and vascular  measurement was performed. A small incision was made on the right anterior chest wall and a subcutaneous pocket fashioned. The power-injectable port was positioned and its catheter tunneled to the right IJ dermatotomy site. The transitional dilator was exchanged over an Amplatz wire for a peel-away sheath, through which the port catheter, which had been trimmed to the appropriate length, was advanced and positioned under fluoroscopy with its tip at the cavoatrial junction. Spot chest radiograph confirms good catheter position and no pneumothorax. The port was flushed per protocol. The pocket was closed with deep interrupted and subcuticular continuous 3-0 Monocryl sutures. The incisions were covered with Dermabond then covered with a sterile dressing. The patient tolerated the procedure  well. COMPLICATIONS: COMPLICATIONS None immediate IMPRESSION: Technically successful right IJ power-injectable port catheter placement. Ready for routine use. Electronically Signed   By: Lucrezia Europe M.D.   On: 05/31/2020 12:26    ASSESSMENT AND PLAN: This is a very pleasant 59 years old white male recently diagnosed with extensive stage small cell lung cancer presented with left upper lobe lung mass in addition to left hilar and mediastinal lymphadenopathy as well as solitary brain metastasis diagnosed in May 2021. He underwent palliative radiotherapy to the left hilar and mediastinal lymphadenopathy in addition to the radiotherapy to the brain metastasis.  He continues to have residual odynophagia and dysphagia from the palliative radiotherapy. He is currently undergoing systemic chemotherapy with carboplatin for AUC of 5 on day 1, etoposide 100 mg/M2 on days 1, 2 and 3 with Cosela infusion before chemotherapy.  He is status post 1 cycle of treatment  The patient tolerated the first cycle of his treatment well. I recommended for the patient to proceed with cycle #2 today as planned. He will come back for follow-up visit in 3  weeks for evaluation with repeat CT scan of the chest, abdomen pelvis for restaging of his disease. For the odynophagia, he will continue his current treatment with viscous lidocaine and oxycodone. The patient was advised to call immediately if he has any concerning symptoms in the interval. The patient voices understanding of current disease status and treatment options and is in agreement with the current care plan.  All questions were answered. The patient knows to call the clinic with any problems, questions or concerns. We can certainly see the patient much sooner if necessary.   Disclaimer: This note was dictated with voice recognition software. Similar sounding words can inadvertently be transcribed and may not be corrected upon review.

## 2020-06-03 NOTE — Patient Instructions (Signed)
Steps to Quit Smoking Smoking tobacco is the leading cause of preventable death. It can affect almost every organ in the body. Smoking puts you and people around you at risk for many serious, long-lasting (chronic) diseases. Quitting smoking can be hard, but it is one of the best things that you can do for your health. It is never too late to quit. How do I get ready to quit? When you decide to quit smoking, make a plan to help you succeed. Before you quit:  Pick a date to quit. Set a date within the next 2 weeks to give you time to prepare.  Write down the reasons why you are quitting. Keep this list in places where you will see it often.  Tell your family, friends, and co-workers that you are quitting. Their support is important.  Talk with your doctor about the choices that may help you quit.  Find out if your health insurance will pay for these treatments.  Know the people, places, things, and activities that make you want to smoke (triggers). Avoid them. What first steps can I take to quit smoking?  Throw away all cigarettes at home, at work, and in your car.  Throw away the things that you use when you smoke, such as ashtrays and lighters.  Clean your car. Make sure to empty the ashtray.  Clean your home, including curtains and carpets. What can I do to help me quit smoking? Talk with your doctor about taking medicines and seeing a counselor at the same time. You are more likely to succeed when you do both.  If you are pregnant or breastfeeding, talk with your doctor about counseling or other ways to quit smoking. Do not take medicine to help you quit smoking unless your doctor tells you to do so. To quit smoking: Quit right away  Quit smoking totally, instead of slowly cutting back on how much you smoke over a period of time.  Go to counseling. You are more likely to quit if you go to counseling sessions regularly. Take medicine You may take medicines to help you quit. Some  medicines need a prescription, and some you can buy over-the-counter. Some medicines may contain a drug called nicotine to replace the nicotine in cigarettes. Medicines may:  Help you to stop having the desire to smoke (cravings).  Help to stop the problems that come when you stop smoking (withdrawal symptoms). Your doctor may ask you to use:  Nicotine patches, gum, or lozenges.  Nicotine inhalers or sprays.  Non-nicotine medicine that is taken by mouth. Find resources Find resources and other ways to help you quit smoking and remain smoke-free after you quit. These resources are most helpful when you use them often. They include:  Online chats with a counselor.  Phone quitlines.  Printed self-help materials.  Support groups or group counseling.  Text messaging programs.  Mobile phone apps. Use apps on your mobile phone or tablet that can help you stick to your quit plan. There are many free apps for mobile phones and tablets as well as websites. Examples include Quit Guide from the CDC and smokefree.gov  What things can I do to make it easier to quit?   Talk to your family and friends. Ask them to support and encourage you.  Call a phone quitline (1-800-QUIT-NOW), reach out to support groups, or work with a counselor.  Ask people who smoke to not smoke around you.  Avoid places that make you want to smoke,   such as: ? Bars. ? Parties. ? Smoke-break areas at work.  Spend time with people who do not smoke.  Lower the stress in your life. Stress can make you want to smoke. Try these things to help your stress: ? Getting regular exercise. ? Doing deep-breathing exercises. ? Doing yoga. ? Meditating. ? Doing a body scan. To do this, close your eyes, focus on one area of your body at a time from head to toe. Notice which parts of your body are tense. Try to relax the muscles in those areas. How will I feel when I quit smoking? Day 1 to 3 weeks Within the first 24 hours,  you may start to have some problems that come from quitting tobacco. These problems are very bad 2-3 days after you quit, but they do not often last for more than 2-3 weeks. You may get these symptoms:  Mood swings.  Feeling restless, nervous, angry, or annoyed.  Trouble concentrating.  Dizziness.  Strong desire for high-sugar foods and nicotine.  Weight gain.  Trouble pooping (constipation).  Feeling like you may vomit (nausea).  Coughing or a sore throat.  Changes in how the medicines that you take for other issues work in your body.  Depression.  Trouble sleeping (insomnia). Week 3 and afterward After the first 2-3 weeks of quitting, you may start to notice more positive results, such as:  Better sense of smell and taste.  Less coughing and sore throat.  Slower heart rate.  Lower blood pressure.  Clearer skin.  Better breathing.  Fewer sick days. Quitting smoking can be hard. Do not give up if you fail the first time. Some people need to try a few times before they succeed. Do your best to stick to your quit plan, and talk with your doctor if you have any questions or concerns. Summary  Smoking tobacco is the leading cause of preventable death. Quitting smoking can be hard, but it is one of the best things that you can do for your health.  When you decide to quit smoking, make a plan to help you succeed.  Quit smoking right away, not slowly over a period of time.  When you start quitting, seek help from your doctor, family, or friends. This information is not intended to replace advice given to you by your health care provider. Make sure you discuss any questions you have with your health care provider. Document Revised: 08/04/2019 Document Reviewed: 01/28/2019 Elsevier Patient Education  2020 Elsevier Inc.  

## 2020-06-03 NOTE — Progress Notes (Signed)
No growth factor on Day 5 for Cycle 2 per Dr. Julien Nordmann.   Will monitor closely & consider using if pt becomes severely neutropenic.  Pt is receiving Cosela.  Kennith Center, Pharm.D., CPP 06/03/2020@12 :05 PM

## 2020-06-03 NOTE — Patient Instructions (Signed)
[facility] Discharge Instructions for Patients Receiving Chemotherapy  Today you received the following chemotherapy agents: Imfinzi, Carboplatin. Etoposide  To help prevent nausea and vomiting after your treatment, we encourage you to take your nausea medication as directed.   If you develop nausea and vomiting that is not controlled by your nausea medication, call the clinic.   BELOW ARE SYMPTOMS THAT SHOULD BE REPORTED IMMEDIATELY:  *FEVER GREATER THAN 100.5 F  *CHILLS WITH OR WITHOUT FEVER  NAUSEA AND VOMITING THAT IS NOT CONTROLLED WITH YOUR NAUSEA MEDICATION  *UNUSUAL SHORTNESS OF BREATH  *UNUSUAL BRUISING OR BLEEDING  TENDERNESS IN MOUTH AND THROAT WITH OR WITHOUT PRESENCE OF ULCERS  *URINARY PROBLEMS  *BOWEL PROBLEMS  UNUSUAL RASH Items with * indicate a potential emergency and should be followed up as soon as possible.  Feel free to call the clinic should you have any questions or concerns. The clinic phone number is [phone].  Please show the [facility] at check-in to the Emergency Department and triage nurse.

## 2020-06-04 ENCOUNTER — Ambulatory Visit (HOSPITAL_BASED_OUTPATIENT_CLINIC_OR_DEPARTMENT_OTHER): Payer: 59 | Admitting: Medical

## 2020-06-04 ENCOUNTER — Telehealth: Payer: Self-pay | Admitting: Internal Medicine

## 2020-06-04 ENCOUNTER — Other Ambulatory Visit: Payer: Self-pay | Admitting: Medical

## 2020-06-04 ENCOUNTER — Inpatient Hospital Stay: Payer: 59

## 2020-06-04 ENCOUNTER — Other Ambulatory Visit: Payer: Self-pay

## 2020-06-04 VITALS — BP 107/66 | HR 84 | Temp 97.8°F | Resp 18

## 2020-06-04 DIAGNOSIS — C3412 Malignant neoplasm of upper lobe, left bronchus or lung: Secondary | ICD-10-CM

## 2020-06-04 DIAGNOSIS — R350 Frequency of micturition: Secondary | ICD-10-CM

## 2020-06-04 DIAGNOSIS — R300 Dysuria: Secondary | ICD-10-CM

## 2020-06-04 DIAGNOSIS — Z5112 Encounter for antineoplastic immunotherapy: Secondary | ICD-10-CM | POA: Diagnosis not present

## 2020-06-04 DIAGNOSIS — L509 Urticaria, unspecified: Secondary | ICD-10-CM

## 2020-06-04 LAB — URINALYSIS, COMPLETE (UACMP) WITH MICROSCOPIC
Bacteria, UA: NONE SEEN
Bilirubin Urine: NEGATIVE
Glucose, UA: NEGATIVE mg/dL
Hgb urine dipstick: NEGATIVE
Ketones, ur: NEGATIVE mg/dL
Leukocytes,Ua: NEGATIVE
Nitrite: NEGATIVE
Protein, ur: NEGATIVE mg/dL
Specific Gravity, Urine: 1.026 (ref 1.005–1.030)
pH: 5 (ref 5.0–8.0)

## 2020-06-04 MED ORDER — SODIUM CHLORIDE 0.9 % IV SOLN
10.0000 mg | Freq: Once | INTRAVENOUS | Status: AC
  Administered 2020-06-04: 10 mg via INTRAVENOUS
  Filled 2020-06-04: qty 10

## 2020-06-04 MED ORDER — TRILACICLIB DIHYDROCHLORIDE INJECTION 300 MG
240.0000 mg/m2 | Freq: Once | INTRAVENOUS | Status: AC
  Administered 2020-06-04: 465 mg via INTRAVENOUS
  Filled 2020-06-04: qty 31

## 2020-06-04 MED ORDER — SODIUM CHLORIDE 0.9% FLUSH
10.0000 mL | INTRAVENOUS | Status: DC | PRN
  Administered 2020-06-04: 10 mL
  Filled 2020-06-04: qty 10

## 2020-06-04 MED ORDER — HEPARIN SOD (PORK) LOCK FLUSH 100 UNIT/ML IV SOLN
500.0000 [IU] | Freq: Once | INTRAVENOUS | Status: AC | PRN
  Administered 2020-06-04: 500 [IU]
  Filled 2020-06-04: qty 5

## 2020-06-04 MED ORDER — SODIUM CHLORIDE 0.9 % IV SOLN
Freq: Once | INTRAVENOUS | Status: AC
  Filled 2020-06-04: qty 250

## 2020-06-04 MED ORDER — DIPHENHYDRAMINE HCL 50 MG/ML IJ SOLN
25.0000 mg | Freq: Once | INTRAMUSCULAR | Status: AC
  Administered 2020-06-04: 25 mg via INTRAVENOUS

## 2020-06-04 MED ORDER — SODIUM CHLORIDE 0.9 % IV SOLN
100.0000 mg/m2 | Freq: Once | INTRAVENOUS | Status: AC
  Administered 2020-06-04: 190 mg via INTRAVENOUS
  Filled 2020-06-04: qty 9.5

## 2020-06-04 MED ORDER — TRIAMCINOLONE ACETONIDE 0.5 % EX CREA: 1.0000 "application " | TOPICAL_CREAM | Freq: Three times a day (TID) | CUTANEOUS | 1 refills | Status: DC

## 2020-06-04 MED ORDER — DIPHENHYDRAMINE HCL 50 MG/ML IJ SOLN
INTRAMUSCULAR | Status: AC
  Filled 2020-06-04: qty 1

## 2020-06-04 NOTE — Telephone Encounter (Signed)
Scheduled appt per 7/12 los - pt to get an updated schedule next appt.

## 2020-06-04 NOTE — Patient Instructions (Signed)
[facility] Discharge Instructions for Patients Receiving Chemotherapy  Today you received the following chemotherapy agents Etoposide; Cosela  To help prevent nausea and vomiting after your treatment, we encourage you to take your nausea medication as directed   If you develop nausea and vomiting that is not controlled by your nausea medication, call the clinic.   BELOW ARE SYMPTOMS THAT SHOULD BE REPORTED IMMEDIATELY:  *FEVER GREATER THAN 100.5 F  *CHILLS WITH OR WITHOUT FEVER  NAUSEA AND VOMITING THAT IS NOT CONTROLLED WITH YOUR NAUSEA MEDICATION  *UNUSUAL SHORTNESS OF BREATH  *UNUSUAL BRUISING OR BLEEDING  TENDERNESS IN MOUTH AND THROAT WITH OR WITHOUT PRESENCE OF ULCERS  *URINARY PROBLEMS  *BOWEL PROBLEMS  UNUSUAL RASH Items with * indicate a potential emergency and should be followed up as soon as possible.  Feel free to call the clinic should you have any questions or concerns. The clinic phone number is [phone].  Please show the [place] at check-in to the Emergency Department and triage nurse.

## 2020-06-04 NOTE — Progress Notes (Signed)
Mr. Cornfield was seen in infusion for a diffuse, slightly raised, erythematous rash at his belt line. The rash does not itch. He has had no change in personal care products. The lesions appear consistent with diffuse hives. He also reported dysuria although a urinalysis returned negative. He was given a prescription for triamcinolone cream. We will away his urine culture.  Sandi Mealy, MHS, PA-C Physician Assistant

## 2020-06-05 ENCOUNTER — Other Ambulatory Visit: Payer: Self-pay | Admitting: Medical

## 2020-06-05 ENCOUNTER — Inpatient Hospital Stay: Payer: 59

## 2020-06-05 VITALS — BP 125/94 | HR 82 | Resp 16

## 2020-06-05 DIAGNOSIS — C3412 Malignant neoplasm of upper lobe, left bronchus or lung: Secondary | ICD-10-CM

## 2020-06-05 DIAGNOSIS — Z5112 Encounter for antineoplastic immunotherapy: Secondary | ICD-10-CM | POA: Diagnosis not present

## 2020-06-05 MED ORDER — DIPHENHYDRAMINE HCL 50 MG/ML IJ SOLN
INTRAMUSCULAR | Status: AC
  Filled 2020-06-05: qty 1

## 2020-06-05 MED ORDER — SODIUM CHLORIDE 0.9 % IV SOLN
10.0000 mg | Freq: Once | INTRAVENOUS | Status: AC
  Administered 2020-06-05: 10 mg via INTRAVENOUS
  Filled 2020-06-05: qty 10

## 2020-06-05 MED ORDER — DIPHENHYDRAMINE HCL 50 MG/ML IJ SOLN
25.0000 mg | Freq: Once | INTRAMUSCULAR | Status: AC
  Administered 2020-06-05: 25 mg via INTRAVENOUS

## 2020-06-05 MED ORDER — SODIUM CHLORIDE 0.9% FLUSH
10.0000 mL | INTRAVENOUS | Status: DC | PRN
  Administered 2020-06-05: 10 mL
  Filled 2020-06-05: qty 10

## 2020-06-05 MED ORDER — PHENAZOPYRIDINE HCL 200 MG PO TABS
200.0000 mg | ORAL_TABLET | Freq: Three times a day (TID) | ORAL | 0 refills | Status: DC | PRN
Start: 2020-06-05 — End: 2020-08-08

## 2020-06-05 MED ORDER — TRILACICLIB DIHYDROCHLORIDE INJECTION 300 MG
240.0000 mg/m2 | Freq: Once | INTRAVENOUS | Status: AC
  Administered 2020-06-05: 465 mg via INTRAVENOUS
  Filled 2020-06-05: qty 31

## 2020-06-05 MED ORDER — HEPARIN SOD (PORK) LOCK FLUSH 100 UNIT/ML IV SOLN
500.0000 [IU] | Freq: Once | INTRAVENOUS | Status: AC | PRN
  Administered 2020-06-05: 500 [IU]
  Filled 2020-06-05: qty 5

## 2020-06-05 MED ORDER — SODIUM CHLORIDE 0.9 % IV SOLN
Freq: Once | INTRAVENOUS | Status: AC
  Filled 2020-06-05: qty 250

## 2020-06-05 MED ORDER — SODIUM CHLORIDE 0.9 % IV SOLN
100.0000 mg/m2 | Freq: Once | INTRAVENOUS | Status: AC
  Administered 2020-06-05: 190 mg via INTRAVENOUS
  Filled 2020-06-05: qty 9.5

## 2020-06-05 MED ORDER — SULFAMETHOXAZOLE-TRIMETHOPRIM 400-80 MG PO TABS: 1.0000 | ORAL_TABLET | Freq: Two times a day (BID) | ORAL | 0 refills | Status: DC

## 2020-06-05 NOTE — Patient Instructions (Signed)
[facility] Discharge Instructions for Patients Receiving Chemotherapy  Today you received the following chemotherapy agents: Etoposide  To help prevent nausea and vomiting after your treatment, we encourage you to take your nausea medication as prescribed.   If you develop nausea and vomiting that is not controlled by your nausea medication, call the clinic.   BELOW ARE SYMPTOMS THAT SHOULD BE REPORTED IMMEDIATELY:  *FEVER GREATER THAN 100.5 F  *CHILLS WITH OR WITHOUT FEVER  NAUSEA AND VOMITING THAT IS NOT CONTROLLED WITH YOUR NAUSEA MEDICATION  *UNUSUAL SHORTNESS OF BREATH  *UNUSUAL BRUISING OR BLEEDING  TENDERNESS IN MOUTH AND THROAT WITH OR WITHOUT PRESENCE OF ULCERS  *URINARY PROBLEMS  *BOWEL PROBLEMS  UNUSUAL RASH Items with * indicate a potential emergency and should be followed up as soon as possible.  Feel free to call the clinic should you have any questions or concerns. The clinic phone number is [phone].  Please show the [facility] at check-in to the Emergency Department and triage nurse.     Urinary Frequency, Adult Urinary frequency means urinating more often than usual. You may urinate every 1-2 hours even though you drink a normal amount of fluid and do not have a bladder infection or condition. Although you urinate more often than normal, the total amount of urine produced in a day is normal. With urinary frequency, you may have an urgent need to urinate often. The stress and anxiety of needing to find a bathroom quickly can make this urge worse. This condition may go away on its own or you may need treatment at home. Home treatment may include bladder training, exercises, taking medicines, or making changes to your diet. Follow these instructions at home: Bladder health   Keep a bladder diary if told by your health care provider. Keep track of: ? What you eat and drink. ? How often you urinate. ? How much you  urinate.  Follow a bladder training program if told by your health care provider. This may include: ? Learning to delay going to the bathroom. ? Double urinating (voiding). This helps if you are not completely emptying your bladder. ? Scheduled voiding.  Do Kegel exercises as told by your health care provider. Kegel exercises strengthen the muscles that help control urination, which may help the condition. Eating and drinking  If told by your health care provider, make diet changes, such as: ? Avoiding caffeine. ? Drinking fewer fluids, especially alcohol. ? Not drinking in the evening. ? Avoiding foods or drinks that may irritate the bladder. These include coffee, tea, soda, artificial sweeteners, citrus, tomato-based foods, and chocolate. ? Eating foods that help prevent or ease constipation. Constipation can make this condition worse. Your health care provider may recommend that you:  Drink enough fluid to keep your urine pale yellow.  Take over-the-counter or prescription medicines.  Eat foods that are high in fiber, such as beans, whole grains, and fresh fruits and vegetables.  Limit foods that are high in fat and processed sugars, such as fried or sweet foods. General instructions  Take over-the-counter and prescription medicines only as told by your health care provider.  Keep all follow-up visits as told by your health care provider. This is important. Contact a health care provider if:  You start urinating more often.  You feel pain or irritation when you urinate.  You notice blood in your urine.  Your urine looks cloudy.  You develop a fever.  You begin vomiting. Get help right  away if:  You are unable to urinate. Summary  Urinary frequency means urinating more often than usual. With urinary frequency, you may urinate every 1-2 hours even though you drink a normal amount of fluid and do not have a bladder infection or other bladder condition.  Your health  care provider may recommend that you keep a bladder diary, follow a bladder training program, or make dietary changes.  If told by your health care provider, do Kegel exercises to strengthen the muscles that help control urination.  Take over-the-counter and prescription medicines only as told by your health care provider.  Contact a health care provider if your symptoms do not improve or get worse. This information is not intended to replace advice given to you by your health care provider. Make sure you discuss any questions you have with your health care provider. Document Revised: 05/19/2018 Document Reviewed: 05/19/2018 Elsevier Patient Education  [place].

## 2020-06-06 LAB — URINE CULTURE: Culture: NO GROWTH

## 2020-06-10 ENCOUNTER — Inpatient Hospital Stay: Payer: 59

## 2020-06-10 NOTE — Progress Notes (Signed)
These preliminary result these preliminary results were noted.  Awaiting final report.

## 2020-06-11 ENCOUNTER — Inpatient Hospital Stay: Payer: 59

## 2020-06-11 ENCOUNTER — Ambulatory Visit (HOSPITAL_COMMUNITY)
Admission: RE | Admit: 2020-06-11 | Discharge: 2020-06-11 | Disposition: A | Discharge status: Home / Self Care | Payer: 59 | Source: Ambulatory Visit | Attending: Medical | Admitting: Medical

## 2020-06-11 ENCOUNTER — Other Ambulatory Visit: Payer: Self-pay

## 2020-06-11 ENCOUNTER — Inpatient Hospital Stay (HOSPITAL_BASED_OUTPATIENT_CLINIC_OR_DEPARTMENT_OTHER): Payer: 59 | Admitting: Medical

## 2020-06-11 ENCOUNTER — Telehealth: Payer: Self-pay | Admitting: Medical Oncology

## 2020-06-11 VITALS — BP 100/64 | HR 83 | Temp 98.3°F | Resp 18 | Ht 72.0 in | Wt 150.3 lb

## 2020-06-11 DIAGNOSIS — C3412 Malignant neoplasm of upper lobe, left bronchus or lung: Secondary | ICD-10-CM | POA: Diagnosis not present

## 2020-06-11 DIAGNOSIS — N289 Disorder of kidney and ureter, unspecified: Secondary | ICD-10-CM

## 2020-06-11 DIAGNOSIS — Z95828 Presence of other vascular implants and grafts: Secondary | ICD-10-CM

## 2020-06-11 DIAGNOSIS — Z5112 Encounter for antineoplastic immunotherapy: Secondary | ICD-10-CM | POA: Diagnosis not present

## 2020-06-11 LAB — CMP (CANCER CENTER ONLY)
ALT: 29 U/L (ref 0–44)
AST: 28 U/L (ref 15–41)
Albumin: 3.8 g/dL (ref 3.5–5.0)
Alkaline Phosphatase: 74 U/L (ref 38–126)
Anion gap: 10 (ref 5–15)
BUN: 47 mg/dL — ABNORMAL HIGH (ref 6–20)
CO2: 26 mmol/L (ref 22–32)
Calcium: 10.2 mg/dL (ref 8.9–10.3)
Chloride: 99 mmol/L (ref 98–111)
Creatinine: 3.12 mg/dL (ref 0.61–1.24)
GFR, Est AFR Am: 24 mL/min — ABNORMAL LOW (ref >60)
GFR, Estimated: 21 mL/min — ABNORMAL LOW (ref >60)
Glucose, Bld: 96 mg/dL (ref 70–99)
Potassium: 4.1 mmol/L (ref 3.5–5.1)
Sodium: 135 mmol/L (ref 135–145)
Total Bilirubin: 0.7 mg/dL (ref 0.3–1.2)
Total Protein: 7.6 g/dL (ref 6.5–8.1)

## 2020-06-11 LAB — CBC WITH DIFFERENTIAL (CANCER CENTER ONLY)
Abs Immature Granulocytes: 0.06 10*3/uL (ref 0.00–0.07)
Basophils Absolute: 0 10*3/uL (ref 0.0–0.1)
Basophils Relative: 1 %
Eosinophils Absolute: 0.1 10*3/uL (ref 0.0–0.5)
Eosinophils Relative: 2 %
HCT: 30.7 % — ABNORMAL LOW (ref 39.0–52.0)
Hemoglobin: 10.9 g/dL — ABNORMAL LOW (ref 13.0–17.0)
Immature Granulocytes: 2 %
Lymphocytes Relative: 16 %
Lymphs Abs: 0.7 10*3/uL (ref 0.7–4.0)
MCH: 33.6 pg (ref 26.0–34.0)
MCHC: 35.5 g/dL (ref 30.0–36.0)
MCV: 94.8 fL (ref 80.0–100.0)
Monocytes Absolute: 0.1 10*3/uL (ref 0.1–1.0)
Monocytes Relative: 3 %
Neutro Abs: 3.1 10*3/uL (ref 1.7–7.7)
Neutrophils Relative %: 76 %
Platelet Count: 156 10*3/uL (ref 150–400)
RBC: 3.24 MIL/uL — ABNORMAL LOW (ref 4.22–5.81)
RDW: 12.3 % (ref 11.5–15.5)
WBC Count: 4.1 10*3/uL (ref 4.0–10.5)
nRBC: 0 % (ref 0.0–0.2)

## 2020-06-11 MED ORDER — SODIUM CHLORIDE 0.9 % IV SOLN
Freq: Once | INTRAVENOUS | Status: AC
  Filled 2020-06-11: qty 250

## 2020-06-11 MED ORDER — HEPARIN SOD (PORK) LOCK FLUSH 100 UNIT/ML IV SOLN
500.0000 [IU] | Freq: Once | INTRAVENOUS | Status: AC
  Administered 2020-06-11: 500 [IU]
  Filled 2020-06-11: qty 5

## 2020-06-11 MED ORDER — SODIUM CHLORIDE 0.9% FLUSH
10.0000 mL | Freq: Once | INTRAVENOUS | Status: AC
  Administered 2020-06-11: 10 mL
  Filled 2020-06-11: qty 10

## 2020-06-11 MED ORDER — SODIUM CHLORIDE 0.9 % IV SOLN
INTRAVENOUS | Status: AC
  Filled 2020-06-11: qty 250

## 2020-06-11 NOTE — Patient Instructions (Signed)

## 2020-06-11 NOTE — Telephone Encounter (Signed)
CRITICAL VALUE STICKER  CRITICAL VALUE: creatinine 3.12  RECEIVER (on-site recipient of call):Quavon Keisling  DATE & TIME NOTIFIED: 07/20/210 1128  MESSENGER (representative from lab):Georgiann Mohs  MD NOTIFIED: Sandi Mealy <PA TIME OF NOTIFICATION:07/20/210 1239  RESPONSE: being seen in [facility]

## 2020-06-12 NOTE — Progress Notes (Signed)
These results were called to [name] and were reviewed with him . His were answered. He expressed understanding.

## 2020-06-13 ENCOUNTER — Inpatient Hospital Stay: Payer: 59

## 2020-06-13 ENCOUNTER — Other Ambulatory Visit: Payer: Self-pay

## 2020-06-13 ENCOUNTER — Inpatient Hospital Stay (HOSPITAL_BASED_OUTPATIENT_CLINIC_OR_DEPARTMENT_OTHER): Payer: 59 | Admitting: Medical

## 2020-06-13 VITALS — BP 105/72 | HR 83 | Temp 97.7°F | Resp 17 | Ht 72.0 in | Wt 154.8 lb

## 2020-06-13 DIAGNOSIS — N289 Disorder of kidney and ureter, unspecified: Secondary | ICD-10-CM

## 2020-06-13 DIAGNOSIS — Z5112 Encounter for antineoplastic immunotherapy: Secondary | ICD-10-CM | POA: Diagnosis not present

## 2020-06-13 DIAGNOSIS — C3412 Malignant neoplasm of upper lobe, left bronchus or lung: Secondary | ICD-10-CM

## 2020-06-13 DIAGNOSIS — N179 Acute kidney failure, unspecified: Secondary | ICD-10-CM

## 2020-06-13 LAB — CMP (CANCER CENTER ONLY)
ALT: 21 U/L (ref 0–44)
AST: 23 U/L (ref 15–41)
Albumin: 3.5 g/dL (ref 3.5–5.0)
Alkaline Phosphatase: 68 U/L (ref 38–126)
Anion gap: 9 (ref 5–15)
BUN: 40 mg/dL — ABNORMAL HIGH (ref 6–20)
CO2: 23 mmol/L (ref 22–32)
Calcium: 9.6 mg/dL (ref 8.9–10.3)
Chloride: 105 mmol/L (ref 98–111)
Creatinine: 2.42 mg/dL — ABNORMAL HIGH (ref 0.61–1.24)
GFR, Est AFR Am: 33 mL/min — ABNORMAL LOW (ref >60)
GFR, Estimated: 28 mL/min — ABNORMAL LOW (ref >60)
Glucose, Bld: 93 mg/dL (ref 70–99)
Potassium: 4.7 mmol/L (ref 3.5–5.1)
Sodium: 137 mmol/L (ref 135–145)
Total Bilirubin: 0.4 mg/dL (ref 0.3–1.2)
Total Protein: 6.9 g/dL (ref 6.5–8.1)

## 2020-06-14 NOTE — Progress Notes (Signed)
Symptoms Management Clinic Progress Note   KOLIN ERDAHL [id number] [birth year] 59 y.o.  TERALD JUMP is managed by Dr. Fanny Bien. Mohamed  Actively treated with chemotherapy/immunotherapy/hormonal therapy: yes  Current therapy: carboplatin, etoposide, Imfinzi and Cosela   Last treated: 06/03/2020 (cycle 2, day 1)  Next scheduled appointment with provider: 06/24/2020  Assessment: Plan:    Renal insufficiency - Plan: 0.9 %  sodium chloride infusion, 0.9 %  sodium chloride infusion, US RENAL, CMP ([facility] only)  Port-A-Cath in place - Plan: sodium chloride flush (NS) 0.9 % injection 10 mL, heparin lock flush 100 unit/mL  Small cell lung cancer, left upper lobe (HCC) - Plan: sodium chloride flush (NS) 0.9 % injection 10 mL, heparin lock flush 100 unit/mL   Acute renal failure: Mr. Robey presents to the clinic today with a chemistry returning showing a creatinine of 3.12 and a BUN of 47. He was told to stop Bactrim which had been given to him for a suspected urinary tract infection. He was given 1 L normal saline IV today and will return in 2 days for labs and an additional liter of IV normal saline. He was referred for a renal ultrasound today which returned showing:  FINDINGS: Right Kidney:  Renal measurements: 12.1 x 5.8 x 5.7 cm = volume: 210 mL. 2.1 cm simple cyst is seen in middle pole.  Echogenicity within normal limits.  No mass or hydronephrosis visualized.  Left Kidney:  Renal measurements: 12.4 x 6.5 x 6.3 cm = volume: 267 mL. Echogenicity within normal limits.  No mass or hydronephrosis visualized.  Bladder:  Completely decompressed and therefore not well visualized.  Other:  None.  IMPRESSION: No significant renal abnormality is noted.   Extensive stage small cell lung cancer: Mr. Hoogland continues to be managed by Dr. Julien Nordmann and is status post cycle 2, day 1 of carboplatin, etoposide, Imfinzi and Cosela which was dosed on 06/03/2020.  He is scheduled to return for labs on 06/17/2020 and will be seen in follow-up on 06/24/2020.  Please see After Visit Summary for patient specific instructions.  Future Appointments  Date Time Provider [place]  06/17/2020 11:30 AM [facility] LAB 4 [facility] None  06/24/2020 10:15 AM [facility] LAB 2 [facility] None  06/24/2020 10:45 AM Curt Bears, MD [facility] None  06/24/2020 11:45 AM [facility] INFUSION [facility] None  06/25/2020  8:45 AM [facility] INFUSION [facility] None  06/26/2020  8:45 AM [facility] INFUSION [facility] None  07/01/2020 11:30 AM [facility] LAB 1 [facility] None  07/08/2020 11:15 AM [facility] LAB 1 [facility] None  07/15/2020 10:30 AM [facility] LAB 1 [facility] None  07/15/2020 11:00 AM Curt Bears, MD [facility] None  07/15/2020 11:45 AM [facility] INFUSION [facility] None  07/16/2020  9:00 AM [facility] None  07/16/2020 10:00 AM Mannam, Praveen, MD [facility] None  10/04/2020  2:00 PM [name], MD [facility] None    Orders Placed This Encounter  Procedures  . US RENAL  . CMP ([facility] only)       Subjective:   Patient ID:  ZERIC BARANOWSKI is a 59 y.o. (DOB 1961-04-16) male.  Chief Complaint: No chief complaint on file.   HPI TEIGEN BELLIN is a 59 y.o. male with a diagnosis of an extensive stage small cell lung cancer.  Mr. Amos is managed by Dr. Julien Nordmann and is status post cycle 2, day 1 of carboplatin, etoposide, Imfinzi and Cosela which was dosed on 06/03/2020. He was seen in the infusion room on  06/04/2020 for a rash around his waistline. This was treated with topical steroids. Additionally, he reported that he was having dysuria without frequency, fevers, chills, or flank pain. A urinalysis returned negative. Despite this the patient was placed on Bactrim while his urine culture was pending. His urine culture ultimately returned negative. He presents to the clinic today with a report that he has been  feeling weak and has felt dizzy and noted that his heart was racing when he was up and about his home. He especially noted the symptoms when he was walking up a flight of stairs. He reports that he is eating and drinking well. A CBC returned today with a hemoglobin of 10.9 and a hematocrit of 30.7. A chemistry panel returned with a BUN of 47 and a creatinine of 3.12. These were up from his last labs of 11 days ago when his BUN was 12 and his creatinine was 0.77. He reports that he has had a history of a kidney stone. He was referred for an ultrasound of his kidneys which returned showing:  FINDINGS: Right Kidney:  Renal measurements: 12.1 x 5.8 x 5.7 cm = volume: 210 mL. 2.1 cm simple cyst is seen in middle pole.  Echogenicity within normal limits.  No mass or hydronephrosis visualized.  Left Kidney:  Renal measurements: 12.4 x 6.5 x 6.3 cm = volume: 267 mL. Echogenicity within normal limits.  No mass or hydronephrosis visualized.  Bladder:  Completely decompressed and therefore not well visualized.  Other:  None.  IMPRESSION: No significant renal abnormality is noted.   Medications: I have reviewed the patient's current medications.  Allergies:  Allergies  Allergen Reactions  . Bee Venom Swelling    Past Medical History:  Diagnosis Date  . Allergic rhinitis   . Allergy   . Anxiety   . Chronic low back pain   . DDD (degenerative disc disease), lumbar   . ED (erectile dysfunction)   . Finger injury    cut pads off 3rd and 4th finger left hand/due to lawn mower accident  . GERD (gastroesophageal reflux disease)   . Heart murmur    as child only-   . History of kidney stones   . HTN (hypertension) 08/20/2016  . Hyperlipidemia   . Incomplete right bundle branch block   . Prostate cancer ([facility]) dx 10/02/14   stage T1c  . Prostate cancer ([facility])   . Sigmoid diverticulosis   . Wears glasses     Past Surgical History:  Procedure Laterality Date  . BRONCHIAL  BIOPSY  04/12/2020   Procedure: BRONCHIAL BIOPSIES;  Surgeon: Marshell Garfinkel, MD;  Location: [facility];  Service: Cardiopulmonary;;  . BRONCHIAL BRUSHINGS  04/12/2020   Procedure: BRONCHIAL BRUSHINGS;  Surgeon: Marshell Garfinkel, MD;  Location: [facility];  Service: Cardiopulmonary;;  . BRONCHIAL NEEDLE ASPIRATION BIOPSY  04/12/2020   Procedure: BRONCHIAL NEEDLE ASPIRATION BIOPSIES;  Surgeon: Marshell Garfinkel, MD;  Location: [facility];  Service: Cardiopulmonary;;  . BRONCHIAL WASHINGS  04/12/2020   Procedure: BRONCHIAL WASHINGS;  Surgeon: Marshell Garfinkel, MD;  Location: [facility];  Service: Cardiopulmonary;;  . COLONOSCOPY    . COLONOSCOPY W/ POLYPECTOMY  04-19-2012  . ENDOBRONCHIAL ULTRASOUND N/A 04/12/2020   Procedure: ENDOBRONCHIAL ULTRASOUND;  Surgeon: Marshell Garfinkel, MD;  Location: [facility];  Service: Cardiopulmonary;  Laterality: N/A;  . ESOPHAGOGASTRODUODENOSCOPY  08-05-2010  . HEMOSTASIS CONTROL  04/12/2020   Procedure: HEMOSTASIS CONTROL;  Surgeon: Marshell Garfinkel, MD;  Location: [facility];  Service: Cardiopulmonary;;  . IR IMAGING GUIDED  PORT INSERTION  05/31/2020  . POLYPECTOMY    . PROSTATE BIOPSY  10/02/14  . RADIOACTIVE SEED IMPLANT N/A 01/30/2015   Procedure: RADIOACTIVE SEED IMPLANT;  Surgeon: Rana Snare, MD;  Location: [facility];  Service: Urology;  Laterality: N/A;  . UPPER GASTROINTESTINAL ENDOSCOPY    . VIDEO BRONCHOSCOPY N/A 04/12/2020   Procedure: VIDEO BRONCHOSCOPY WITHOUT FLUORO;  Surgeon: Marshell Garfinkel, MD;  Location: [facility];  Service: Cardiopulmonary;  Laterality: N/A;    Family History  Problem Relation Age of Onset  . Prostate cancer Father   . Heart disease Father   . Heart disease Mother   . Breast cancer Sister   . Heart disease Other   . Colon cancer Neg Hx   . Colon polyps Neg Hx   . Esophageal cancer Neg Hx   . Rectal cancer Neg Hx   . Stomach cancer Neg Hx     Social History   Socioeconomic History  .  Marital status: Married    Spouse name: Not on file  . Number of children: 0  . Years of education: Not on file  . Highest education level: Not on file  Occupational History  . Occupation: Counselling psychologist: [employer]  Tobacco Use  . Smoking status: Current Every Day Smoker    Packs/day: 1.00    Years: 25.00    Pack years: 25.00    Types: Cigarettes  . Smokeless tobacco: Never Used  Vaping Use  . Vaping Use: Former  Substance and Sexual Activity  . Alcohol use: Yes    Alcohol/week: 49.0 standard drinks    Types: 25 Cans of beer, 24 Standard drinks or equivalent per week    Comment: 5 beer per day  . Drug use: No  . Sexual activity: Not on file  Other Topics Concern  . 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 He is married with no biological children.   Social Determinants of Health   Financial Resource Strain:   . Difficulty of Paying Living Expenses:   Food Insecurity:   . Worried About Charity fundraiser in the Last Year:   . Arboriculturist in the Last Year:   Transportation Needs:   . Film/video editor (Medical):   Marland Kitchen Lack of Transportation (Non-Medical):   Physical Activity:   . Days of Exercise per Week:   . Minutes of Exercise per Session:   Stress:   . Feeling of Stress :   Social Connections:   . Frequency of Communication with Friends and Family:   . Frequency of Social Gatherings with Friends and Family:   . Attends Religious Services:   . Active Member of Clubs or Organizations:   . Attends Archivist Meetings:   Marland Kitchen Marital Status:   Intimate Partner Violence:   . Fear of Current or Ex-Partner:   . Emotionally Abused:   Marland Kitchen Physically Abused:   . Sexually Abused:     Past Medical History, Surgical history, Social history, and Family history were reviewed and updated as appropriate.   Please see review of systems for further details on the  patient's review from today.   Review of Systems:  Review of Systems  Constitutional: Negative for appetite change, chills, diaphoresis and fever.  HENT: Negative for dental problem, mouth sores and trouble swallowing.   Respiratory: Negative for cough, chest tightness and shortness of  breath.   Cardiovascular: Positive for palpitations. Negative for chest pain.  Gastrointestinal: Negative for constipation, diarrhea, nausea and vomiting.  Neurological: Positive for dizziness and weakness. Negative for syncope and headaches.    Objective:   Physical Exam:  BP 100/64   Pulse 83   Temp 98.3 F (36.8 C) (Oral)   Resp 18   Ht 6' (1.829 m)   Wt 150 lb 4.8 oz (68.2 kg)   SpO2 100%   BMI 20.38 kg/m  ECOG: 0  Physical Exam Constitutional:      General: He is not in acute distress.    Appearance: He is not diaphoretic.  HENT:     Head: Normocephalic and atraumatic.  Eyes:     General: No scleral icterus.       Right eye: No discharge.        Left eye: No discharge.  Cardiovascular:     Rate and Rhythm: Normal rate and regular rhythm.     Heart sounds: Normal heart sounds. No murmur heard.  No friction rub. No gallop.   Pulmonary:     Effort: Pulmonary effort is normal. No respiratory distress.     Breath sounds: Normal breath sounds. No wheezing or rales.  Abdominal:     General: Bowel sounds are normal. There is no distension.     Tenderness: There is no abdominal tenderness. There is no guarding.  Musculoskeletal:     Right lower leg: No edema.     Left lower leg: No edema.  Skin:    General: Skin is warm and dry.     Findings: No erythema or rash.  Neurological:     Mental Status: He is alert.     Coordination: Coordination normal.     Gait: Gait normal.     Lab Review:     Component Value Date/Time   NA 137 06/13/2020 1112   K 4.7 06/13/2020 1112   CL 105 06/13/2020 1112   CO2 23 06/13/2020 1112   GLUCOSE 93 06/13/2020 1112   BUN 40 (H) 06/13/2020 1112    CREATININE 2.42 (H) 06/13/2020 1112   CALCIUM 9.6 06/13/2020 1112   PROT 6.9 06/13/2020 1112   ALBUMIN 3.5 06/13/2020 1112   AST 23 06/13/2020 1112   ALT 21 06/13/2020 1112   ALKPHOS 68 06/13/2020 1112   BILITOT 0.4 06/13/2020 1112   GFRNONAA 28 (L) 06/13/2020 1112   GFRAA 33 (L) 06/13/2020 1112       Component Value Date/Time   WBC 4.1 06/11/2020 1118   WBC 7.1 04/12/2020 0345   RBC 3.24 (L) 06/11/2020 1118   HGB 10.9 (L) 06/11/2020 1118   HCT 30.7 (L) 06/11/2020 1118   PLT 156 06/11/2020 1118   MCV 94.8 06/11/2020 1118   MCH 33.6 06/11/2020 1118   MCHC 35.5 06/11/2020 1118   RDW 12.3 06/11/2020 1118   LYMPHSABS 0.7 06/11/2020 1118   MONOABS 0.1 06/11/2020 1118   EOSABS 0.1 06/11/2020 1118   BASOSABS 0.0 06/11/2020 1118   -------------------------------  Imaging from last 24 hours (if applicable):  Radiology interpretation: US RENAL  Result Date: 06/11/2020 CLINICAL DATA:  Renal insufficiency. EXAM: RENAL / URINARY TRACT ULTRASOUND COMPLETE COMPARISON:  None. FINDINGS: Right Kidney: Renal measurements: 12.1 x 5.8 x 5.7 cm = volume: 210 mL. 2.1 cm simple cyst is seen in middle pole. Echogenicity within normal limits. No mass or hydronephrosis visualized. Left Kidney: Renal measurements: 12.4 x 6.5 x 6.3 cm = volume: 267 mL. Echogenicity within normal  limits. No mass or hydronephrosis visualized. Bladder: Completely decompressed and therefore not well visualized. Other: None. IMPRESSION: No significant renal abnormality is noted. Electronically Signed   By: Marijo Conception M.D.   On: 06/11/2020 15:49   IR IMAGING GUIDED PORT INSERTION  Result Date: 05/31/2020 CLINICAL DATA:  Lung cancer, needs durable venous access for planned treatment regimen EXAM: TUNNELED PORT CATHETER PLACEMENT WITH ULTRASOUND AND FLUOROSCOPIC GUIDANCE FLUOROSCOPY TIME:  6 seconds; less than 1 mGy ANESTHESIA/SEDATION: Intravenous Fentanyl 21mcg and Versed 1mg  were administered as conscious sedation during  continuous monitoring of the patient's level of consciousness and physiological / cardiorespiratory status by the radiology RN, with a total moderate sedation time of 17 minutes. TECHNIQUE: The procedure, risks, benefits, and alternatives were explained to the patient. Questions regarding the procedure were encouraged and answered. The patient understands and consents to the procedure. As antibiotic prophylaxis, cefazolin 2 g was ordered pre-procedure and administered intravenously within one hour of incision. Patency of the right IJ vein was confirmed with ultrasound with image documentation. An appropriate skin site was determined. Skin site was marked. Region was prepped using maximum barrier technique including cap and mask, sterile gown, sterile gloves, large sterile sheet, and Chlorhexidine as cutaneous antisepsis. The region was infiltrated locally with 1% lidocaine. Under real-time ultrasound guidance, the right IJ vein was accessed with a 21 gauge micropuncture needle; the needle tip within the vein was confirmed with ultrasound image documentation. Needle was exchanged over a 018 guidewire for transitional dilator, and vascular measurement was performed. A small incision was made on the right anterior chest wall and a subcutaneous pocket fashioned. The power-injectable port was positioned and its catheter tunneled to the right IJ dermatotomy site. The transitional dilator was exchanged over an Amplatz wire for a peel-away sheath, through which the port catheter, which had been trimmed to the appropriate length, was advanced and positioned under fluoroscopy with its tip at the cavoatrial junction. Spot chest radiograph confirms good catheter position and no pneumothorax. The port was flushed per protocol. The pocket was closed with deep interrupted and subcuticular continuous 3-0 Monocryl sutures. The incisions were covered with Dermabond then covered with a sterile dressing. The patient tolerated the  procedure well. COMPLICATIONS: COMPLICATIONS None immediate IMPRESSION: Technically successful right IJ power-injectable port catheter placement. Ready for routine use. Electronically Signed   By: Lucrezia Europe M.D.   On: 05/31/2020 12:26        This case was discussed with Dr. Julien Nordmann. He expressed agreement with my management of this patient.

## 2020-06-17 ENCOUNTER — Inpatient Hospital Stay: Payer: 59

## 2020-06-17 ENCOUNTER — Other Ambulatory Visit: Payer: Self-pay

## 2020-06-17 DIAGNOSIS — C3412 Malignant neoplasm of upper lobe, left bronchus or lung: Secondary | ICD-10-CM

## 2020-06-17 DIAGNOSIS — Z95828 Presence of other vascular implants and grafts: Secondary | ICD-10-CM

## 2020-06-17 DIAGNOSIS — Z5112 Encounter for antineoplastic immunotherapy: Secondary | ICD-10-CM | POA: Diagnosis not present

## 2020-06-17 LAB — CMP (CANCER CENTER ONLY)
ALT: 15 U/L (ref 0–44)
AST: 20 U/L (ref 15–41)
Albumin: 3.4 g/dL — ABNORMAL LOW (ref 3.5–5.0)
Alkaline Phosphatase: 62 U/L (ref 38–126)
Anion gap: 8 (ref 5–15)
BUN: 17 mg/dL (ref 6–20)
CO2: 23 mmol/L (ref 22–32)
Calcium: 9.4 mg/dL (ref 8.9–10.3)
Chloride: 109 mmol/L (ref 98–111)
Creatinine: 1.55 mg/dL — ABNORMAL HIGH (ref 0.61–1.24)
GFR, Est AFR Am: 56 mL/min — ABNORMAL LOW (ref >60)
GFR, Estimated: 49 mL/min — ABNORMAL LOW (ref >60)
Glucose, Bld: 103 mg/dL — ABNORMAL HIGH (ref 70–99)
Potassium: 4.6 mmol/L (ref 3.5–5.1)
Sodium: 140 mmol/L (ref 135–145)
Total Bilirubin: 0.3 mg/dL (ref 0.3–1.2)
Total Protein: 6.8 g/dL (ref 6.5–8.1)

## 2020-06-17 LAB — CBC WITH DIFFERENTIAL (CANCER CENTER ONLY)
Abs Immature Granulocytes: 0.01 10*3/uL (ref 0.00–0.07)
Basophils Absolute: 0 10*3/uL (ref 0.0–0.1)
Basophils Relative: 1 %
Eosinophils Absolute: 0.1 10*3/uL (ref 0.0–0.5)
Eosinophils Relative: 4 %
HCT: 25.6 % — ABNORMAL LOW (ref 39.0–52.0)
Hemoglobin: 8.9 g/dL — ABNORMAL LOW (ref 13.0–17.0)
Immature Granulocytes: 0 %
Lymphocytes Relative: 23 %
Lymphs Abs: 0.7 10*3/uL (ref 0.7–4.0)
MCH: 33.1 pg (ref 26.0–34.0)
MCHC: 34.8 g/dL (ref 30.0–36.0)
MCV: 95.2 fL (ref 80.0–100.0)
Monocytes Absolute: 0.4 10*3/uL (ref 0.1–1.0)
Monocytes Relative: 11 %
Neutro Abs: 1.9 10*3/uL (ref 1.7–7.7)
Neutrophils Relative %: 61 %
Platelet Count: 86 10*3/uL — ABNORMAL LOW (ref 150–400)
RBC: 2.69 MIL/uL — ABNORMAL LOW (ref 4.22–5.81)
RDW: 12.7 % (ref 11.5–15.5)
WBC Count: 3.1 10*3/uL — ABNORMAL LOW (ref 4.0–10.5)
nRBC: 0 % (ref 0.0–0.2)

## 2020-06-17 MED ORDER — HEPARIN SOD (PORK) LOCK FLUSH 100 UNIT/ML IV SOLN
500.0000 [IU] | Freq: Once | INTRAVENOUS | Status: AC
  Administered 2020-06-17: 500 [IU]
  Filled 2020-06-17: qty 5

## 2020-06-17 MED ORDER — SODIUM CHLORIDE 0.9% FLUSH
10.0000 mL | Freq: Once | INTRAVENOUS | Status: AC
  Administered 2020-06-17: 10 mL
  Filled 2020-06-17: qty 10

## 2020-06-17 NOTE — Progress Notes (Signed)
The patient is seen today in follow-up of a visit from 06/11/2020 when he was noted to have acute renal failure.  He was given 1 L of normal saline at that time and was told to push fluids.  His creatinine at that time was 3.12 with a BUN of 47.  His labs today returned showing a creatinine of 2.42 with a BUN of 40.  The patient would prefer not to stay for IV fluids today.  He will return as scheduled for labs on 06/17/2020.  He was told to increase his fluid intake by 50%.  Mr. Wilbourne expresses understanding and agreement with this plan. This case was discussed with Dr. Julien Nordmann. He expressed agreement with my management of this patient.   Sandi Mealy, MHS, PA-C Physician Assistant

## 2020-06-19 ENCOUNTER — Telehealth: Payer: Self-pay | Admitting: Radiation Oncology

## 2020-06-19 ENCOUNTER — Other Ambulatory Visit: Payer: Self-pay | Admitting: Radiation Oncology

## 2020-06-19 NOTE — Telephone Encounter (Signed)
  Radiation Oncology         [phone]) [phone] ________________________________  Name: Richard Santos MRN: [id number]  Date of Service: 06/19/2020  DOB: [birth year]  Post Treatment Telephone Note  Diagnosis:    Extensive stage small cell lung cancer arising in the left lung with advanced disease locally and brain metastasis.  Interval Since Last Radiation:  5 weeks   04/25/20-05/15/20: The left hilar target was treated to 37.5 Gy in 15 fractions.   2016:  The prostate was treated with radioactive seeds with the following prescription: Prostate 14,500 cGy. Isotope: I-125 utilizing 79 seeds and 29 active needles. Individual seed activity 0.47 mCi per seed for a total implant activity of 37.13 mCi.  Narrative:  The patient was contacted today for routine follow-up. During treatment he did very well with radiotherapy and did not have significant desquamation.    Impression/Plan: 1.  Extensive stage small cell lung cancer arising in the left lung with advanced disease locally and brain metastasis. I left the patient a voicemail and on it, I  discussed that we would continue to follow along with his course while he proceeds with treatment under Dr. Julien Nordmann in medical oncology.  2.         Brain metastasis. The patient will proceed with his systemic therapy. There is some level of penetrance that his regimen may give, and Dr. Julien Nordmann would like to try systemic therapy prior to radiotherapy. I reviewed that if he has a good improvement overall, Dr. Lisbeth Renshaw would consider whole brain radiotherapy to reduce risks of recurrence. We anticipate he would have another MRI in the next     Carola Rhine, [facility]

## 2020-06-20 ENCOUNTER — Telehealth: Payer: Self-pay | Admitting: Radiation Oncology

## 2020-06-20 NOTE — Telephone Encounter (Signed)
The patient called me back and we reviewed that he is now doing much better since the radiotherapy ended as far as eating and drinking. He is having some difficulties with hypersensitivities to his chemo regimen. I let him know that I had sent a message to med onc yesterday about wanting to know the anticipated timing of his next MRI scan. He is aware of the role for radiotherapy to the brain at some point in the future and we will be in touch once we have a better indication of timing of his next MRI.    Carola Rhine, PAC

## 2020-06-24 ENCOUNTER — Inpatient Hospital Stay: Payer: 59

## 2020-06-24 ENCOUNTER — Other Ambulatory Visit: Payer: Self-pay

## 2020-06-24 ENCOUNTER — Inpatient Hospital Stay: Payer: 59 | Attending: Internal Medicine

## 2020-06-24 ENCOUNTER — Other Ambulatory Visit: Payer: Self-pay | Admitting: Medical Oncology

## 2020-06-24 ENCOUNTER — Inpatient Hospital Stay (HOSPITAL_BASED_OUTPATIENT_CLINIC_OR_DEPARTMENT_OTHER): Payer: 59 | Admitting: Internal Medicine

## 2020-06-24 ENCOUNTER — Encounter: Payer: Self-pay | Admitting: Internal Medicine

## 2020-06-24 VITALS — BP 123/78 | HR 91 | Temp 97.7°F | Resp 18 | Ht 72.0 in | Wt 156.4 lb

## 2020-06-24 DIAGNOSIS — Z8546 Personal history of malignant neoplasm of prostate: Secondary | ICD-10-CM | POA: Diagnosis not present

## 2020-06-24 DIAGNOSIS — C3412 Malignant neoplasm of upper lobe, left bronchus or lung: Secondary | ICD-10-CM | POA: Insufficient documentation

## 2020-06-24 DIAGNOSIS — Z5189 Encounter for other specified aftercare: Secondary | ICD-10-CM | POA: Insufficient documentation

## 2020-06-24 DIAGNOSIS — C7931 Secondary malignant neoplasm of brain: Secondary | ICD-10-CM | POA: Insufficient documentation

## 2020-06-24 DIAGNOSIS — T451X5A Adverse effect of antineoplastic and immunosuppressive drugs, initial encounter: Secondary | ICD-10-CM | POA: Insufficient documentation

## 2020-06-24 DIAGNOSIS — I10 Essential (primary) hypertension: Secondary | ICD-10-CM

## 2020-06-24 DIAGNOSIS — Z5112 Encounter for antineoplastic immunotherapy: Secondary | ICD-10-CM | POA: Diagnosis not present

## 2020-06-24 DIAGNOSIS — Z7181 Spiritual or religious counseling: Secondary | ICD-10-CM

## 2020-06-24 DIAGNOSIS — Z7982 Long term (current) use of aspirin: Secondary | ICD-10-CM | POA: Diagnosis not present

## 2020-06-24 DIAGNOSIS — Z5111 Encounter for antineoplastic chemotherapy: Secondary | ICD-10-CM | POA: Diagnosis not present

## 2020-06-24 DIAGNOSIS — Z79899 Other long term (current) drug therapy: Secondary | ICD-10-CM | POA: Insufficient documentation

## 2020-06-24 DIAGNOSIS — D6481 Anemia due to antineoplastic chemotherapy: Secondary | ICD-10-CM | POA: Diagnosis not present

## 2020-06-24 DIAGNOSIS — Z95828 Presence of other vascular implants and grafts: Secondary | ICD-10-CM

## 2020-06-24 LAB — CBC WITH DIFFERENTIAL (CANCER CENTER ONLY)
Abs Immature Granulocytes: 0.01 10*3/uL (ref 0.00–0.07)
Basophils Absolute: 0 10*3/uL (ref 0.0–0.1)
Basophils Relative: 1 %
Eosinophils Absolute: 0.2 10*3/uL (ref 0.0–0.5)
Eosinophils Relative: 6 %
HCT: 27 % — ABNORMAL LOW (ref 39.0–52.0)
Hemoglobin: 9.3 g/dL — ABNORMAL LOW (ref 13.0–17.0)
Immature Granulocytes: 0 %
Lymphocytes Relative: 26 %
Lymphs Abs: 0.7 10*3/uL (ref 0.7–4.0)
MCH: 33 pg (ref 26.0–34.0)
MCHC: 34.4 g/dL (ref 30.0–36.0)
MCV: 95.7 fL (ref 80.0–100.0)
Monocytes Absolute: 0.4 10*3/uL (ref 0.1–1.0)
Monocytes Relative: 16 %
Neutro Abs: 1.4 10*3/uL — ABNORMAL LOW (ref 1.7–7.7)
Neutrophils Relative %: 51 %
Platelet Count: 239 10*3/uL (ref 150–400)
RBC: 2.82 MIL/uL — ABNORMAL LOW (ref 4.22–5.81)
RDW: 13.7 % (ref 11.5–15.5)
WBC Count: 2.7 10*3/uL — ABNORMAL LOW (ref 4.0–10.5)
nRBC: 0 % (ref 0.0–0.2)

## 2020-06-24 LAB — CMP (CANCER CENTER ONLY)
ALT: 15 U/L (ref 0–44)
AST: 25 U/L (ref 15–41)
Albumin: 3.8 g/dL (ref 3.5–5.0)
Alkaline Phosphatase: 67 U/L (ref 38–126)
Anion gap: 9 (ref 5–15)
BUN: 12 mg/dL (ref 6–20)
CO2: 27 mmol/L (ref 22–32)
Calcium: 9.8 mg/dL (ref 8.9–10.3)
Chloride: 104 mmol/L (ref 98–111)
Creatinine: 1.02 mg/dL (ref 0.61–1.24)
GFR, Est AFR Am: >60 mL/min (ref >60)
GFR, Estimated: >60 mL/min (ref >60)
Glucose, Bld: 109 mg/dL — ABNORMAL HIGH (ref 70–99)
Potassium: 4.3 mmol/L (ref 3.5–5.1)
Sodium: 140 mmol/L (ref 135–145)
Total Bilirubin: 0.4 mg/dL (ref 0.3–1.2)
Total Protein: 7.2 g/dL (ref 6.5–8.1)

## 2020-06-24 LAB — TSH: TSH: 1.372 u[IU]/mL (ref 0.320–4.118)

## 2020-06-24 MED ORDER — PALONOSETRON HCL INJECTION 0.25 MG/5ML
INTRAVENOUS | Status: AC
  Filled 2020-06-24: qty 5

## 2020-06-24 MED ORDER — TRILACICLIB DIHYDROCHLORIDE INJECTION 300 MG
240.0000 mg/m2 | Freq: Once | INTRAVENOUS | Status: AC
  Administered 2020-06-24: 465 mg via INTRAVENOUS
  Filled 2020-06-24: qty 31

## 2020-06-24 MED ORDER — SODIUM CHLORIDE 0.9 % IV SOLN
150.0000 mg | Freq: Once | INTRAVENOUS | Status: AC
  Administered 2020-06-24: 150 mg via INTRAVENOUS
  Filled 2020-06-24: qty 150

## 2020-06-24 MED ORDER — SODIUM CHLORIDE 0.9 % IV SOLN
100.0000 mg/m2 | Freq: Once | INTRAVENOUS | Status: AC
  Administered 2020-06-24: 190 mg via INTRAVENOUS
  Filled 2020-06-24: qty 9.5

## 2020-06-24 MED ORDER — SODIUM CHLORIDE 0.9 % IV SOLN
390.5000 mg | Freq: Once | INTRAVENOUS | Status: AC
  Administered 2020-06-24: 390 mg via INTRAVENOUS
  Filled 2020-06-24: qty 39

## 2020-06-24 MED ORDER — PALONOSETRON HCL INJECTION 0.25 MG/5ML
0.2500 mg | Freq: Once | INTRAVENOUS | Status: AC
  Administered 2020-06-24: 0.25 mg via INTRAVENOUS

## 2020-06-24 MED ORDER — DIPHENHYDRAMINE HCL 50 MG/ML IJ SOLN
25.0000 mg | Freq: Once | INTRAMUSCULAR | Status: AC
  Administered 2020-06-24: 25 mg via INTRAVENOUS

## 2020-06-24 MED ORDER — SODIUM CHLORIDE 0.9 % IV SOLN
Freq: Once | INTRAVENOUS | Status: AC
  Filled 2020-06-24: qty 250

## 2020-06-24 MED ORDER — DIPHENHYDRAMINE HCL 50 MG/ML IJ SOLN
INTRAMUSCULAR | Status: AC
  Filled 2020-06-24: qty 1

## 2020-06-24 MED ORDER — SODIUM CHLORIDE 0.9 % IV SOLN
10.0000 mg | Freq: Once | INTRAVENOUS | Status: AC
  Administered 2020-06-24: 10 mg via INTRAVENOUS
  Filled 2020-06-24: qty 10

## 2020-06-24 MED ORDER — SODIUM CHLORIDE 0.9% FLUSH
10.0000 mL | Freq: Once | INTRAVENOUS | Status: AC
  Administered 2020-06-24: 10 mL
  Filled 2020-06-24: qty 10

## 2020-06-24 MED ORDER — HEPARIN SOD (PORK) LOCK FLUSH 100 UNIT/ML IV SOLN
500.0000 [IU] | Freq: Once | INTRAVENOUS | Status: AC | PRN
  Administered 2020-06-24: 500 [IU]
  Filled 2020-06-24: qty 5

## 2020-06-24 MED ORDER — SODIUM CHLORIDE 0.9% FLUSH
10.0000 mL | INTRAVENOUS | Status: DC | PRN
  Administered 2020-06-24: 10 mL
  Filled 2020-06-24: qty 10

## 2020-06-24 MED ORDER — SODIUM CHLORIDE 0.9 % IV SOLN
1500.0000 mg | Freq: Once | INTRAVENOUS | Status: AC
  Administered 2020-06-24: 1500 mg via INTRAVENOUS
  Filled 2020-06-24: qty 30

## 2020-06-24 NOTE — Patient Instructions (Signed)
Steps to Quit Smoking Smoking tobacco is the leading cause of preventable death. It can affect almost every organ in the body. Smoking puts you and people around you at risk for many serious, long-lasting (chronic) diseases. Quitting smoking can be hard, but it is one of the best things that you can do for your health. It is never too late to quit. How do I get ready to quit? When you decide to quit smoking, make a plan to help you succeed. Before you quit:  Pick a date to quit. Set a date within the next 2 weeks to give you time to prepare.  Write down the reasons why you are quitting. Keep this list in places where you will see it often.  Tell your family, friends, and co-workers that you are quitting. Their support is important.  Talk with your doctor about the choices that may help you quit.  Find out if your health insurance will pay for these treatments.  Know the people, places, things, and activities that make you want to smoke (triggers). Avoid them. What first steps can I take to quit smoking?  Throw away all cigarettes at home, at work, and in your car.  Throw away the things that you use when you smoke, such as ashtrays and lighters.  Clean your car. Make sure to empty the ashtray.  Clean your home, including curtains and carpets. What can I do to help me quit smoking? Talk with your doctor about taking medicines and seeing a counselor at the same time. You are more likely to succeed when you do both.  If you are pregnant or breastfeeding, talk with your doctor about counseling or other ways to quit smoking. Do not take medicine to help you quit smoking unless your doctor tells you to do so. To quit smoking: Quit right away  Quit smoking totally, instead of slowly cutting back on how much you smoke over a period of time.  Go to counseling. You are more likely to quit if you go to counseling sessions regularly. Take medicine You may take medicines to help you quit. Some  medicines need a prescription, and some you can buy over-the-counter. Some medicines may contain a drug called nicotine to replace the nicotine in cigarettes. Medicines may:  Help you to stop having the desire to smoke (cravings).  Help to stop the problems that come when you stop smoking (withdrawal symptoms). Your doctor may ask you to use:  Nicotine patches, gum, or lozenges.  Nicotine inhalers or sprays.  Non-nicotine medicine that is taken by mouth. Find resources Find resources and other ways to help you quit smoking and remain smoke-free after you quit. These resources are most helpful when you use them often. They include:  Online chats with a counselor.  Phone quitlines.  Printed self-help materials.  Support groups or group counseling.  Text messaging programs.  Mobile phone apps. Use apps on your mobile phone or tablet that can help you stick to your quit plan. There are many free apps for mobile phones and tablets as well as websites. Examples include Quit Guide from the CDC and smokefree.gov  What things can I do to make it easier to quit?   Talk to your family and friends. Ask them to support and encourage you.  Call a phone quitline (1-800-QUIT-NOW), reach out to support groups, or work with a counselor.  Ask people who smoke to not smoke around you.  Avoid places that make you want to smoke,   such as: ? Bars. ? Parties. ? Smoke-break areas at work.  Spend time with people who do not smoke.  Lower the stress in your life. Stress can make you want to smoke. Try these things to help your stress: ? Getting regular exercise. ? Doing deep-breathing exercises. ? Doing yoga. ? Meditating. ? Doing a body scan. To do this, close your eyes, focus on one area of your body at a time from head to toe. Notice which parts of your body are tense. Try to relax the muscles in those areas. How will I feel when I quit smoking? Day 1 to 3 weeks Within the first 24 hours,  you may start to have some problems that come from quitting tobacco. These problems are very bad 2-3 days after you quit, but they do not often last for more than 2-3 weeks. You may get these symptoms:  Mood swings.  Feeling restless, nervous, angry, or annoyed.  Trouble concentrating.  Dizziness.  Strong desire for high-sugar foods and nicotine.  Weight gain.  Trouble pooping (constipation).  Feeling like you may vomit (nausea).  Coughing or a sore throat.  Changes in how the medicines that you take for other issues work in your body.  Depression.  Trouble sleeping (insomnia). Week 3 and afterward After the first 2-3 weeks of quitting, you may start to notice more positive results, such as:  Better sense of smell and taste.  Less coughing and sore throat.  Slower heart rate.  Lower blood pressure.  Clearer skin.  Better breathing.  Fewer sick days. Quitting smoking can be hard. Do not give up if you fail the first time. Some people need to try a few times before they succeed. Do your best to stick to your quit plan, and talk with your doctor if you have any questions or concerns. Summary  Smoking tobacco is the leading cause of preventable death. Quitting smoking can be hard, but it is one of the best things that you can do for your health.  When you decide to quit smoking, make a plan to help you succeed.  Quit smoking right away, not slowly over a period of time.  When you start quitting, seek help from your doctor, family, or friends. This information is not intended to replace advice given to you by your health care provider. Make sure you discuss any questions you have with your health care provider. Document Revised: 08/04/2019 Document Reviewed: 01/28/2019 Elsevier Patient Education  2020 Elsevier Inc.  

## 2020-06-24 NOTE — Progress Notes (Signed)
ANC 1.4. Okay to treat, per Dr. Julien Nordmann.

## 2020-06-24 NOTE — Progress Notes (Signed)
[place] [phone]   [phone]  OFFICE PROGRESS NOTE  [name], MD [place] [address]  DIAGNOSIS: Extensive stage (T2b, N2, M1c) small cell lung cancer presented with left upper lobe pulmonary nodule in addition to left hilar mass with mediastinal invasion and solitary brain metastasis diagnosed in May 2021  PRIOR THERAPY: Palliative radiation to the left hilar and mediastinal lymphadenopathy under the care of Dr. Lisbeth Renshaw.  CURRENT THERAPY:  Palliative systemic chemotherapy with carboplatin for AUC of 5 on day 1, etoposide 100 mg/M2 on days 1, 2 and 3 as well as Imfinzi 1500 mg IV every 3 weeks with the chemotherapy.  First dose of chemotherapy May 06, 2020.  The patient will receive treatment with Cosela on the days of his chemotherapy.  Status post 2 cycles.  INTERVAL HISTORY: Richard Santos 59 y.o. male returns to the clinic today for follow-up visit.  The patient is feeling fine today with no concerning complaints.  He denied having any current chest pain, shortness of breath, cough or hemoptysis.  He denied having any fever or chills.  He has no nausea, vomiting, diarrhea or constipation.  He denied having any headache or visual changes.  He has been tolerating his treatment fairly well except for the lack of appetite and dehydration.  He also has renal insufficiency but improved with hydration and IV fluid.  He is scheduled to have repeat CT scan of the chest, abdomen pelvis later this week. The patient is here today for evaluation before starting cycle #3.   MEDICAL HISTORY: Past Medical History:  Diagnosis Date  . Allergic rhinitis   . Allergy   . Anxiety   . Chronic low back pain   . DDD (degenerative disc disease), lumbar   . ED (erectile dysfunction)   . Finger injury    cut pads off 3rd and 4th finger left hand/due to lawn mower accident  . GERD (gastroesophageal reflux disease)   . Heart murmur    as  child only-   . History of kidney stones   . HTN (hypertension) 08/20/2016  . Hyperlipidemia   . Incomplete right bundle branch block   . Prostate cancer ([place]) dx 10/02/14   stage T1c  . Prostate cancer ([place])   . Sigmoid diverticulosis   . Wears glasses     ALLERGIES:  is allergic to sulfur, bee venom, and sulfa antibiotics.  MEDICATIONS:  Current Outpatient Medications  Medication Sig Dispense Refill  . acetaminophen (TYLENOL) 325 MG tablet Take 2 tablets (650 mg total) by mouth every 6 (six) hours as needed for mild pain (or Fever >/= 101). 30 tablet 0  . aspirin EC 81 MG tablet Take 81 mg by mouth daily. Swallow whole.    Marland Kitchen atorvastatin (LIPITOR) 40 MG tablet Take 1 tablet (40 mg total) by mouth daily. 90 tablet 3  . busPIRone (BUSPAR) 10 MG tablet Take 1 tablet (10 mg total) by mouth 2 (two) times daily. 180 tablet 3  . EPINEPHrine (EPIPEN) 0.3 mg/0.3 mL IJ SOAJ injection Inject 0.3 mLs (0.3 mg total) into the muscle once. 2 Device 2  . folic acid (FOLVITE) 1 MG tablet Take 1 tablet (1 mg total) by mouth daily. (Patient not taking: Reported on 05/24/2020) 30 tablet 0  . HYDROcodone-acetaminophen (HYCET) 7.5-325 mg/15 ml solution Take 10 mLs by mouth 4 (four) times daily as needed for moderate pain. 473 mL 0  . lidocaine (XYLOCAINE)  2 % solution Use as directed 10 mLs in the mouth or throat every 6 (six) hours as needed for mouth pain. 450 mL 1  . losartan (COZAAR) 50 MG tablet Take 1 tablet (50 mg total) by mouth daily. (Patient not taking: Reported on 05/24/2020) 90 tablet 3  . meloxicam (MOBIC) 15 MG tablet TAKE 1 TABLET BY MOUTH EVERY DAY AS NEEDED FOR PAIN (Patient taking differently: Take 15 mg by mouth daily as needed for pain. ) 30 tablet 2  . Multiple Vitamin (MULTIVITAMIN) capsule Take 1 capsule by mouth daily.    . nicotine (NICODERM CQ - DOSED IN MG/24 HOURS) 21 mg/24hr patch Place 1 patch (21 mg total) onto the skin daily. 28 patch 0  . nicotine (NICODERM CQ) 14 mg/24hr patch  Place 1 patch (14 mg total) onto the skin daily. 28 patch 3  . nicotine polacrilex (COMMIT) 4 MG lozenge Take 1 lozenge (4 mg total) by mouth as needed for smoking cessation. 108 tablet 6  . pantoprazole (PROTONIX) 40 MG tablet Take 1 tablet (40 mg total) by mouth daily before breakfast. 90 tablet 3  . phenazopyridine (PYRIDIUM) 200 MG tablet Take 1 tablet (200 mg total) by mouth 3 (three) times daily as needed for pain. 12 tablet 0  . prochlorperazine (COMPAZINE) 10 MG tablet Take 1 tablet (10 mg total) by mouth every 6 (six) hours as needed for nausea or vomiting. 30 tablet 0  . sucralfate (CARAFATE) 1 g tablet PLEASE SEE ATTACHED FOR DETAILED DIRECTIONS 120 tablet 0  . traMADol (ULTRAM) 50 MG tablet TAKE HALF TABLET BY MOUTH 2 TIMES DAILY AS NEEDED FOR PAIN (Patient taking differently: Take 25 mg by mouth every 12 (twelve) hours as needed for moderate pain. TAKE HALF TABLET BY MOUTH 2 TIMES DAILY AS NEEDED FOR PAIN) 60 tablet 2  . triamcinolone cream (KENALOG) 0.5 % Apply 1 application topically 3 (three) times daily. 90 g 1   No current facility-administered medications for this visit.    SURGICAL HISTORY:  Past Surgical History:  Procedure Laterality Date  . BRONCHIAL BIOPSY  04/12/2020   Procedure: BRONCHIAL BIOPSIES;  Surgeon: Marshell Garfinkel, MD;  Location: [facility];  Service: Cardiopulmonary;;  . BRONCHIAL BRUSHINGS  04/12/2020   Procedure: BRONCHIAL BRUSHINGS;  Surgeon: Marshell Garfinkel, MD;  Location: [facility];  Service: Cardiopulmonary;;  . BRONCHIAL NEEDLE ASPIRATION BIOPSY  04/12/2020   Procedure: BRONCHIAL NEEDLE ASPIRATION BIOPSIES;  Surgeon: Marshell Garfinkel, MD;  Location: [facility];  Service: Cardiopulmonary;;  . BRONCHIAL WASHINGS  04/12/2020   Procedure: BRONCHIAL WASHINGS;  Surgeon: Marshell Garfinkel, MD;  Location: [facility];  Service: Cardiopulmonary;;  . COLONOSCOPY    . COLONOSCOPY W/ POLYPECTOMY  04-19-2012  . ENDOBRONCHIAL ULTRASOUND N/A 04/12/2020    Procedure: ENDOBRONCHIAL ULTRASOUND;  Surgeon: Marshell Garfinkel, MD;  Location: [facility];  Service: Cardiopulmonary;  Laterality: N/A;  . ESOPHAGOGASTRODUODENOSCOPY  08-05-2010  . HEMOSTASIS CONTROL  04/12/2020   Procedure: HEMOSTASIS CONTROL;  Surgeon: Marshell Garfinkel, MD;  Location: [facility];  Service: Cardiopulmonary;;  . IR IMAGING GUIDED PORT INSERTION  05/31/2020  . POLYPECTOMY    . PROSTATE BIOPSY  10/02/14  . RADIOACTIVE SEED IMPLANT N/A 01/30/2015   Procedure: RADIOACTIVE SEED IMPLANT;  Surgeon: Rana Snare, MD;  Location: [facility];  Service: Urology;  Laterality: N/A;  . UPPER GASTROINTESTINAL ENDOSCOPY    . VIDEO BRONCHOSCOPY N/A 04/12/2020   Procedure: VIDEO BRONCHOSCOPY WITHOUT FLUORO;  Surgeon: Marshell Garfinkel, MD;  Location: [facility];  Service: Cardiopulmonary;  Laterality:  N/A;    REVIEW OF SYSTEMS:  A comprehensive review of systems was negative except for: Constitutional: positive for fatigue   PHYSICAL EXAMINATION: General appearance: alert, cooperative, fatigued and no distress Head: Normocephalic, without obvious abnormality, atraumatic Neck: no adenopathy, no JVD, supple, symmetrical, trachea midline and thyroid not enlarged, symmetric, no tenderness/mass/nodules Lymph nodes: Cervical, supraclavicular, and axillary nodes normal. Resp: clear to auscultation bilaterally Back: symmetric, no curvature. ROM normal. No CVA tenderness. Cardio: regular rate and rhythm, S1, S2 normal, no murmur, click, rub or gallop GI: soft, non-tender; bowel sounds normal; no masses,  no organomegaly Extremities: extremities normal, atraumatic, no cyanosis or edema  ECOG PERFORMANCE STATUS: 1 - Symptomatic but completely ambulatory  Blood pressure 123/78, pulse 91, temperature 97.7 F (36.5 C), temperature source Temporal, resp. rate 18, height 6' (1.829 m), weight 156 lb 6.4 oz (70.9 kg), SpO2 100 %.  LABORATORY DATA: Lab Results  Component Value Date   WBC  2.7 (L) 06/24/2020   HGB 9.3 (L) 06/24/2020   HCT 27.0 (L) 06/24/2020   MCV 95.7 06/24/2020   PLT 239 06/24/2020      Chemistry      Component Value Date/Time   NA 140 06/17/2020 1200   K 4.6 06/17/2020 1200   CL 109 06/17/2020 1200   CO2 23 06/17/2020 1200   BUN 17 06/17/2020 1200   CREATININE 1.55 (H) 06/17/2020 1200      Component Value Date/Time   CALCIUM 9.4 06/17/2020 1200   ALKPHOS 62 06/17/2020 1200   AST 20 06/17/2020 1200   ALT 15 06/17/2020 1200   BILITOT 0.3 06/17/2020 1200       RADIOGRAPHIC STUDIES: US RENAL  Result Date: 06/11/2020 CLINICAL DATA:  Renal insufficiency. EXAM: RENAL / URINARY TRACT ULTRASOUND COMPLETE COMPARISON:  None. FINDINGS: Right Kidney: Renal measurements: 12.1 x 5.8 x 5.7 cm = volume: 210 mL. 2.1 cm simple cyst is seen in middle pole. Echogenicity within normal limits. No mass or hydronephrosis visualized. Left Kidney: Renal measurements: 12.4 x 6.5 x 6.3 cm = volume: 267 mL. Echogenicity within normal limits. No mass or hydronephrosis visualized. Bladder: Completely decompressed and therefore not well visualized. Other: None. IMPRESSION: No significant renal abnormality is noted. Electronically Signed   By: Marijo Conception M.D.   On: 06/11/2020 15:49   IR IMAGING GUIDED PORT INSERTION  Result Date: 05/31/2020 CLINICAL DATA:  Lung cancer, needs durable venous access for planned treatment regimen EXAM: TUNNELED PORT CATHETER PLACEMENT WITH ULTRASOUND AND FLUOROSCOPIC GUIDANCE FLUOROSCOPY TIME:  6 seconds; less than 1 mGy ANESTHESIA/SEDATION: Intravenous Fentanyl 6mcg and Versed 1mg  were administered as conscious sedation during continuous monitoring of the patient's level of consciousness and physiological / cardiorespiratory status by the radiology RN, with a total moderate sedation time of 17 minutes. TECHNIQUE: The procedure, risks, benefits, and alternatives were explained to the patient. Questions regarding the procedure were encouraged and  answered. The patient understands and consents to the procedure. As antibiotic prophylaxis, cefazolin 2 g was ordered pre-procedure and administered intravenously within one hour of incision. Patency of the right IJ vein was confirmed with ultrasound with image documentation. An appropriate skin site was determined. Skin site was marked. Region was prepped using maximum barrier technique including cap and mask, sterile gown, sterile gloves, large sterile sheet, and Chlorhexidine as cutaneous antisepsis. The region was infiltrated locally with 1% lidocaine. Under real-time ultrasound guidance, the right IJ vein was accessed with a 21 gauge micropuncture needle; the needle tip within the vein was  confirmed with ultrasound image documentation. Needle was exchanged over a 018 guidewire for transitional dilator, and vascular measurement was performed. A small incision was made on the right anterior chest wall and a subcutaneous pocket fashioned. The power-injectable port was positioned and its catheter tunneled to the right IJ dermatotomy site. The transitional dilator was exchanged over an Amplatz wire for a peel-away sheath, through which the port catheter, which had been trimmed to the appropriate length, was advanced and positioned under fluoroscopy with its tip at the cavoatrial junction. Spot chest radiograph confirms good catheter position and no pneumothorax. The port was flushed per protocol. The pocket was closed with deep interrupted and subcuticular continuous 3-0 Monocryl sutures. The incisions were covered with Dermabond then covered with a sterile dressing. The patient tolerated the procedure well. COMPLICATIONS: COMPLICATIONS None immediate IMPRESSION: Technically successful right IJ power-injectable port catheter placement. Ready for routine use. Electronically Signed   By: Lucrezia Europe M.D.   On: 05/31/2020 12:26    ASSESSMENT AND PLAN: This is a very pleasant 59 years old white male recently diagnosed  with extensive stage small cell lung cancer presented with left upper lobe lung mass in addition to left hilar and mediastinal lymphadenopathy as well as solitary brain metastasis diagnosed in May 2021. He underwent palliative radiotherapy to the left hilar and mediastinal lymphadenopathy in addition to the radiotherapy to the brain metastasis.  He continues to have residual odynophagia and dysphagia from the palliative radiotherapy. He is currently undergoing systemic chemotherapy with carboplatin for AUC of 5 on day 1, etoposide 100 mg/M2 on days 1, 2 and 3 with Cosela infusion before chemotherapy.  He is status post 2 cycles of treatment  He continues to tolerate this treatment well with no concerning adverse effects. I recommended for him to proceed with cycle #3 today as planned. He is scheduled to have repeat CT scan of the chest, abdomen pelvis later this week. He will come back for follow-up visit in 3 weeks for evaluation before the next cycle of his treatment. For the renal insufficiency, I strongly recommend for him to increase his oral intake and hydration.  We will continue to monitor his blood chemistry closely. The patient was advised to call immediately if he has any concerning symptoms in the interval. The patient voices understanding of current disease status and treatment options and is in agreement with the current care plan.  All questions were answered. The patient knows to call the clinic with any problems, questions or concerns. We can certainly see the patient much sooner if necessary.   Disclaimer: This note was dictated with voice recognition software. Similar sounding words can inadvertently be transcribed and may not be corrected upon review.

## 2020-06-24 NOTE — Progress Notes (Signed)
06/24/20  Confirmed patient to get Fulphila on day 5 due to Impact 1.4  Dr Mohamed/Bastien Strawser Ronnald Ramp, PharmD

## 2020-06-24 NOTE — Patient Instructions (Signed)
[facility] Discharge Instructions for Patients Receiving Chemotherapy  Today you received the following chemotherapy agents: Imfinzi, Carboplatin. Etoposide  To help prevent nausea and vomiting after your treatment, we encourage you to take your nausea medication as directed.   If you develop nausea and vomiting that is not controlled by your nausea medication, call the clinic.   BELOW ARE SYMPTOMS THAT SHOULD BE REPORTED IMMEDIATELY:  *FEVER GREATER THAN 100.5 F  *CHILLS WITH OR WITHOUT FEVER  NAUSEA AND VOMITING THAT IS NOT CONTROLLED WITH YOUR NAUSEA MEDICATION  *UNUSUAL SHORTNESS OF BREATH  *UNUSUAL BRUISING OR BLEEDING  TENDERNESS IN MOUTH AND THROAT WITH OR WITHOUT PRESENCE OF ULCERS  *URINARY PROBLEMS  *BOWEL PROBLEMS  UNUSUAL RASH Items with * indicate a potential emergency and should be followed up as soon as possible.  Feel free to call the clinic should you have any questions or concerns. The clinic phone number is [phone].  Please show the [facility] at check-in to the Emergency Department and triage nurse.

## 2020-06-25 ENCOUNTER — Inpatient Hospital Stay: Payer: 59

## 2020-06-25 ENCOUNTER — Other Ambulatory Visit: Payer: Self-pay

## 2020-06-25 VITALS — BP 120/78 | HR 85 | Temp 98.0°F | Resp 18

## 2020-06-25 DIAGNOSIS — Z5112 Encounter for antineoplastic immunotherapy: Secondary | ICD-10-CM | POA: Diagnosis not present

## 2020-06-25 DIAGNOSIS — C3412 Malignant neoplasm of upper lobe, left bronchus or lung: Secondary | ICD-10-CM

## 2020-06-25 MED ORDER — HEPARIN SOD (PORK) LOCK FLUSH 100 UNIT/ML IV SOLN
500.0000 [IU] | Freq: Once | INTRAVENOUS | Status: AC | PRN
  Administered 2020-06-25: 500 [IU]
  Filled 2020-06-25: qty 5

## 2020-06-25 MED ORDER — SODIUM CHLORIDE 0.9 % IV SOLN
Freq: Once | INTRAVENOUS | Status: AC
  Filled 2020-06-25: qty 250

## 2020-06-25 MED ORDER — DIPHENHYDRAMINE HCL 50 MG/ML IJ SOLN
INTRAMUSCULAR | Status: AC
  Filled 2020-06-25: qty 1

## 2020-06-25 MED ORDER — TRILACICLIB DIHYDROCHLORIDE INJECTION 300 MG
240.0000 mg/m2 | Freq: Once | INTRAVENOUS | Status: AC
  Administered 2020-06-25: 465 mg via INTRAVENOUS
  Filled 2020-06-25: qty 31

## 2020-06-25 MED ORDER — DIPHENHYDRAMINE HCL 50 MG/ML IJ SOLN
25.0000 mg | Freq: Once | INTRAMUSCULAR | Status: AC
  Administered 2020-06-25: 25 mg via INTRAVENOUS

## 2020-06-25 MED ORDER — SODIUM CHLORIDE 0.9% FLUSH
10.0000 mL | INTRAVENOUS | Status: DC | PRN
  Administered 2020-06-25: 10 mL
  Filled 2020-06-25: qty 10

## 2020-06-25 MED ORDER — SODIUM CHLORIDE 0.9 % IV SOLN
100.0000 mg/m2 | Freq: Once | INTRAVENOUS | Status: AC
  Administered 2020-06-25: 190 mg via INTRAVENOUS
  Filled 2020-06-25: qty 9.5

## 2020-06-25 MED ORDER — SODIUM CHLORIDE 0.9 % IV SOLN
10.0000 mg | Freq: Once | INTRAVENOUS | Status: AC
  Administered 2020-06-25: 10 mg via INTRAVENOUS
  Filled 2020-06-25: qty 10

## 2020-06-25 NOTE — Patient Instructions (Signed)
[facility] Discharge Instructions for Patients Receiving Chemotherapy  Today you received the following chemotherapy agents: Etoposide  To help prevent nausea and vomiting after your treatment, we encourage you to take your nausea medication as directed.   If you develop nausea and vomiting that is not controlled by your nausea medication, call the clinic.   BELOW ARE SYMPTOMS THAT SHOULD BE REPORTED IMMEDIATELY:  *FEVER GREATER THAN 100.5 F  *CHILLS WITH OR WITHOUT FEVER  NAUSEA AND VOMITING THAT IS NOT CONTROLLED WITH YOUR NAUSEA MEDICATION  *UNUSUAL SHORTNESS OF BREATH  *UNUSUAL BRUISING OR BLEEDING  TENDERNESS IN MOUTH AND THROAT WITH OR WITHOUT PRESENCE OF ULCERS  *URINARY PROBLEMS  *BOWEL PROBLEMS  UNUSUAL RASH Items with * indicate a potential emergency and should be followed up as soon as possible.  Feel free to call the clinic should you have any questions or concerns. The clinic phone number is [phone].  Please show the [facility] at check-in to the Emergency Department and triage nurse.

## 2020-06-26 ENCOUNTER — Other Ambulatory Visit: Payer: Self-pay

## 2020-06-26 ENCOUNTER — Encounter: Payer: Self-pay | Admitting: General Practice

## 2020-06-26 ENCOUNTER — Inpatient Hospital Stay: Payer: 59

## 2020-06-26 VITALS — BP 112/66 | HR 83 | Temp 98.0°F | Resp 16

## 2020-06-26 DIAGNOSIS — C3412 Malignant neoplasm of upper lobe, left bronchus or lung: Secondary | ICD-10-CM

## 2020-06-26 DIAGNOSIS — Z5112 Encounter for antineoplastic immunotherapy: Secondary | ICD-10-CM | POA: Diagnosis not present

## 2020-06-26 MED ORDER — HEPARIN SOD (PORK) LOCK FLUSH 100 UNIT/ML IV SOLN
500.0000 [IU] | Freq: Once | INTRAVENOUS | Status: AC | PRN
  Administered 2020-06-26: 500 [IU]
  Filled 2020-06-26: qty 5

## 2020-06-26 MED ORDER — DIPHENHYDRAMINE HCL 50 MG/ML IJ SOLN
25.0000 mg | Freq: Once | INTRAMUSCULAR | Status: AC
  Administered 2020-06-26: 25 mg via INTRAVENOUS

## 2020-06-26 MED ORDER — SODIUM CHLORIDE 0.9 % IV SOLN
Freq: Once | INTRAVENOUS | Status: AC
  Filled 2020-06-26: qty 250

## 2020-06-26 MED ORDER — DIPHENHYDRAMINE HCL 50 MG/ML IJ SOLN
INTRAMUSCULAR | Status: AC
  Filled 2020-06-26: qty 1

## 2020-06-26 MED ORDER — SODIUM CHLORIDE 0.9 % IV SOLN
100.0000 mg/m2 | Freq: Once | INTRAVENOUS | Status: AC
  Administered 2020-06-26: 190 mg via INTRAVENOUS
  Filled 2020-06-26: qty 9.5

## 2020-06-26 MED ORDER — SODIUM CHLORIDE 0.9% FLUSH
10.0000 mL | INTRAVENOUS | Status: DC | PRN
  Administered 2020-06-26: 10 mL
  Filled 2020-06-26: qty 10

## 2020-06-26 MED ORDER — TRILACICLIB DIHYDROCHLORIDE INJECTION 300 MG
240.0000 mg/m2 | Freq: Once | INTRAVENOUS | Status: AC
  Administered 2020-06-26: 465 mg via INTRAVENOUS
  Filled 2020-06-26: qty 31

## 2020-06-26 MED ORDER — SODIUM CHLORIDE 0.9 % IV SOLN
10.0000 mg | Freq: Once | INTRAVENOUS | Status: AC
  Administered 2020-06-26: 10 mg via INTRAVENOUS
  Filled 2020-06-26: qty 10

## 2020-06-26 NOTE — Patient Instructions (Signed)
[facility] Discharge Instructions for Patients Receiving Chemotherapy  Today you received the following chemotherapy agents: Etoposide  To help prevent nausea and vomiting after your treatment, we encourage you to take your nausea medication as directed.   If you develop nausea and vomiting that is not controlled by your nausea medication, call the clinic.   BELOW ARE SYMPTOMS THAT SHOULD BE REPORTED IMMEDIATELY:  *FEVER GREATER THAN 100.5 F  *CHILLS WITH OR WITHOUT FEVER  NAUSEA AND VOMITING THAT IS NOT CONTROLLED WITH YOUR NAUSEA MEDICATION  *UNUSUAL SHORTNESS OF BREATH  *UNUSUAL BRUISING OR BLEEDING  TENDERNESS IN MOUTH AND THROAT WITH OR WITHOUT PRESENCE OF ULCERS  *URINARY PROBLEMS  *BOWEL PROBLEMS  UNUSUAL RASH Items with * indicate a potential emergency and should be followed up as soon as possible.  Feel free to call the clinic should you have any questions or concerns. The clinic phone number is [phone].  Please show the [facility] at check-in to the Emergency Department and triage nurse.

## 2020-06-26 NOTE — Progress Notes (Signed)
[facility] Spiritual Care Note  Met Mr Batz briefly in infusion per patient request via Sharyn Lull Stoisits/LCSW. We set up an appointment for Monday 8/9 at ca noon, following his other appointments that morning. He also has my card and brochure, in case needs arise in the meantime.   [place], [place], [facility] Pager [phone] Voicemail [phone]

## 2020-06-27 ENCOUNTER — Other Ambulatory Visit: Payer: Self-pay | Admitting: Radiation Oncology

## 2020-06-27 ENCOUNTER — Other Ambulatory Visit: Payer: Self-pay

## 2020-06-27 ENCOUNTER — Encounter (HOSPITAL_COMMUNITY): Payer: Self-pay

## 2020-06-27 ENCOUNTER — Inpatient Hospital Stay: Payer: 59

## 2020-06-27 ENCOUNTER — Ambulatory Visit (HOSPITAL_COMMUNITY)
Admission: RE | Admit: 2020-06-27 | Discharge: 2020-06-27 | Disposition: A | Discharge status: Home / Self Care | Payer: 59 | Source: Ambulatory Visit | Attending: Internal Medicine | Admitting: Internal Medicine

## 2020-06-27 VITALS — BP 139/75 | HR 93 | Temp 98.0°F | Resp 18

## 2020-06-27 DIAGNOSIS — Z5112 Encounter for antineoplastic immunotherapy: Secondary | ICD-10-CM | POA: Diagnosis not present

## 2020-06-27 DIAGNOSIS — C3490 Malignant neoplasm of unspecified part of unspecified bronchus or lung: Secondary | ICD-10-CM | POA: Insufficient documentation

## 2020-06-27 DIAGNOSIS — C3412 Malignant neoplasm of upper lobe, left bronchus or lung: Secondary | ICD-10-CM

## 2020-06-27 MED ORDER — HEPARIN SOD (PORK) LOCK FLUSH 100 UNIT/ML IV SOLN
INTRAVENOUS | Status: AC
  Filled 2020-06-27: qty 5

## 2020-06-27 MED ORDER — SODIUM CHLORIDE (PF) 0.9 % IJ SOLN
INTRAMUSCULAR | Status: AC
  Filled 2020-06-27: qty 50

## 2020-06-27 MED ORDER — PEGFILGRASTIM-JMDB 6 MG/0.6ML ~~LOC~~ SOSY
PREFILLED_SYRINGE | SUBCUTANEOUS | Status: AC
  Filled 2020-06-27: qty 0.6

## 2020-06-27 MED ORDER — PEGFILGRASTIM-JMDB 6 MG/0.6ML ~~LOC~~ SOSY
6.0000 mg | PREFILLED_SYRINGE | Freq: Once | SUBCUTANEOUS | Status: AC
  Administered 2020-06-27: 6 mg via SUBCUTANEOUS

## 2020-06-27 MED ORDER — IOHEXOL 300 MG/ML  SOLN
100.0000 mL | Freq: Once | INTRAMUSCULAR | Status: AC | PRN
  Administered 2020-06-27: 100 mL via INTRAVENOUS

## 2020-06-27 NOTE — Patient Instructions (Signed)

## 2020-06-28 ENCOUNTER — Ambulatory Visit: Payer: 59

## 2020-06-29 NOTE — Progress Notes (Signed)
  Radiation Oncology         [phone] ________________________________  Name: Richard Santos MRN: [id number]  Date: 05/15/2020  DOB: [birth year]  End of Treatment Note  Diagnosis:  Lung cancer     Indication for treatment::  curative       Radiation treatment dates:   04/25/20 - 05/15/20  Site/dose:   The patient was treated to the disease within the left lung initially to a dose of 37.5 Gy in 15 fractions using a 3 field, 3-D conformal technique.   Narrative: The patient tolerated radiation treatment relatively well.    Plan: The patient has completed radiation treatment. The patient will return to radiation oncology clinic for routine followup in one month. I advised the patient to call or return sooner if they have any questions or concerns related to their recovery or treatment. ________________________________  Jodelle Gross, M.D., Ph.D.

## 2020-07-01 ENCOUNTER — Inpatient Hospital Stay: Payer: 59

## 2020-07-01 ENCOUNTER — Other Ambulatory Visit: Payer: Self-pay

## 2020-07-01 ENCOUNTER — Encounter: Payer: Self-pay | Admitting: General Practice

## 2020-07-01 DIAGNOSIS — C3412 Malignant neoplasm of upper lobe, left bronchus or lung: Secondary | ICD-10-CM

## 2020-07-01 DIAGNOSIS — Z95828 Presence of other vascular implants and grafts: Secondary | ICD-10-CM

## 2020-07-01 DIAGNOSIS — Z5112 Encounter for antineoplastic immunotherapy: Secondary | ICD-10-CM | POA: Diagnosis not present

## 2020-07-01 LAB — CMP (CANCER CENTER ONLY)
ALT: 21 U/L (ref 0–44)
AST: 22 U/L (ref 15–41)
Albumin: 3.7 g/dL (ref 3.5–5.0)
Alkaline Phosphatase: 98 U/L (ref 38–126)
Anion gap: 7 (ref 5–15)
BUN: 16 mg/dL (ref 6–20)
CO2: 26 mmol/L (ref 22–32)
Calcium: 9.8 mg/dL (ref 8.9–10.3)
Chloride: 105 mmol/L (ref 98–111)
Creatinine: 0.88 mg/dL (ref 0.61–1.24)
GFR, Est AFR Am: >60 mL/min (ref >60)
GFR, Estimated: >60 mL/min (ref >60)
Glucose, Bld: 115 mg/dL — ABNORMAL HIGH (ref 70–99)
Potassium: 4.4 mmol/L (ref 3.5–5.1)
Sodium: 138 mmol/L (ref 135–145)
Total Bilirubin: 0.6 mg/dL (ref 0.3–1.2)
Total Protein: 7 g/dL (ref 6.5–8.1)

## 2020-07-01 LAB — CBC WITH DIFFERENTIAL (CANCER CENTER ONLY)
Abs Immature Granulocytes: 0.1 10*3/uL — ABNORMAL HIGH (ref 0.00–0.07)
Basophils Absolute: 0.1 10*3/uL (ref 0.0–0.1)
Basophils Relative: 1 %
Eosinophils Absolute: 0 10*3/uL (ref 0.0–0.5)
Eosinophils Relative: 0 %
HCT: 23.7 % — ABNORMAL LOW (ref 39.0–52.0)
Hemoglobin: 8.1 g/dL — ABNORMAL LOW (ref 13.0–17.0)
Immature Granulocytes: 1 %
Lymphocytes Relative: 11 %
Lymphs Abs: 0.9 10*3/uL (ref 0.7–4.0)
MCH: 33.3 pg (ref 26.0–34.0)
MCHC: 34.2 g/dL (ref 30.0–36.0)
MCV: 97.5 fL (ref 80.0–100.0)
Monocytes Absolute: 0.5 10*3/uL (ref 0.1–1.0)
Monocytes Relative: 6 %
Neutro Abs: 6.9 10*3/uL (ref 1.7–7.7)
Neutrophils Relative %: 81 %
Platelet Count: 176 10*3/uL (ref 150–400)
RBC: 2.43 MIL/uL — ABNORMAL LOW (ref 4.22–5.81)
RDW: 14.5 % (ref 11.5–15.5)
WBC Count: 8.4 10*3/uL (ref 4.0–10.5)
nRBC: 0 % (ref 0.0–0.2)

## 2020-07-01 MED ORDER — SODIUM CHLORIDE 0.9% FLUSH
10.0000 mL | Freq: Once | INTRAVENOUS | Status: AC
  Administered 2020-07-01: 10 mL
  Filled 2020-07-01: qty 10

## 2020-07-01 MED ORDER — HEPARIN SOD (PORK) LOCK FLUSH 100 UNIT/ML IV SOLN
500.0000 [IU] | Freq: Once | INTRAVENOUS | Status: AC
  Administered 2020-07-01: 500 [IU]
  Filled 2020-07-01: qty 5

## 2020-07-01 NOTE — Progress Notes (Signed)
[facility] Spiritual Care Note  Met with Richard Santos in my office for ca 30 minutes as planned for theological reflection and life review. He was very welcoming of pastoral listening and left feeling more peaceful. Serving as a conversation partner with him was an Surveyor, minerals. He knows to contact chaplain whenever needed/desired.   [place], [place], [facility] Pager [phone] Voicemail [phone]

## 2020-07-05 ENCOUNTER — Encounter: Payer: Self-pay | Admitting: General Practice

## 2020-07-05 NOTE — Progress Notes (Signed)
[facility] CSW Progress Notes  Call from patient - he is concerned about medical bills and inability to pay them.  He will receive disability in December but has no income until then.  He wants to set up small payment/month until December.  Provided contact information for [facility] [employer] and [facility] [place] - suggested he seek guidance from these organizations.  Also suggested he contact [place].  He has already been in touch w [facility] billing but hopes to have help making smaller payments until his disability is granted.    Edwyna Shell, LCSW Clinical Social Worker Phone:  [phone]

## 2020-07-08 ENCOUNTER — Inpatient Hospital Stay: Payer: 59

## 2020-07-08 ENCOUNTER — Other Ambulatory Visit: Payer: Self-pay

## 2020-07-08 DIAGNOSIS — C3412 Malignant neoplasm of upper lobe, left bronchus or lung: Secondary | ICD-10-CM

## 2020-07-08 DIAGNOSIS — Z95828 Presence of other vascular implants and grafts: Secondary | ICD-10-CM

## 2020-07-08 DIAGNOSIS — Z5112 Encounter for antineoplastic immunotherapy: Secondary | ICD-10-CM | POA: Diagnosis not present

## 2020-07-08 LAB — CMP (CANCER CENTER ONLY)
ALT: 20 U/L (ref 0–44)
AST: 23 U/L (ref 15–41)
Albumin: 3.8 g/dL (ref 3.5–5.0)
Alkaline Phosphatase: 107 U/L (ref 38–126)
Anion gap: 10 (ref 5–15)
BUN: 12 mg/dL (ref 6–20)
CO2: 25 mmol/L (ref 22–32)
Calcium: 9.7 mg/dL (ref 8.9–10.3)
Chloride: 105 mmol/L (ref 98–111)
Creatinine: 0.92 mg/dL (ref 0.61–1.24)
GFR, Est AFR Am: >60 mL/min (ref >60)
GFR, Estimated: >60 mL/min (ref >60)
Glucose, Bld: 127 mg/dL — ABNORMAL HIGH (ref 70–99)
Potassium: 4.1 mmol/L (ref 3.5–5.1)
Sodium: 140 mmol/L (ref 135–145)
Total Bilirubin: 0.2 mg/dL — ABNORMAL LOW (ref 0.3–1.2)
Total Protein: 7.1 g/dL (ref 6.5–8.1)

## 2020-07-08 LAB — CBC WITH DIFFERENTIAL (CANCER CENTER ONLY)
Abs Immature Granulocytes: 0.34 10*3/uL — ABNORMAL HIGH (ref 0.00–0.07)
Basophils Absolute: 0 10*3/uL (ref 0.0–0.1)
Basophils Relative: 0 %
Eosinophils Absolute: 0.1 10*3/uL (ref 0.0–0.5)
Eosinophils Relative: 1 %
HCT: 22.6 % — ABNORMAL LOW (ref 39.0–52.0)
Hemoglobin: 7.7 g/dL — ABNORMAL LOW (ref 13.0–17.0)
Immature Granulocytes: 3 %
Lymphocytes Relative: 10 %
Lymphs Abs: 1.1 10*3/uL (ref 0.7–4.0)
MCH: 33 pg (ref 26.0–34.0)
MCHC: 34.1 g/dL (ref 30.0–36.0)
MCV: 97 fL (ref 80.0–100.0)
Monocytes Absolute: 0.7 10*3/uL (ref 0.1–1.0)
Monocytes Relative: 7 %
Neutro Abs: 8.5 10*3/uL — ABNORMAL HIGH (ref 1.7–7.7)
Neutrophils Relative %: 79 %
Platelet Count: 96 10*3/uL — ABNORMAL LOW (ref 150–400)
RBC: 2.33 MIL/uL — ABNORMAL LOW (ref 4.22–5.81)
RDW: 14.6 % (ref 11.5–15.5)
WBC Count: 10.7 10*3/uL — ABNORMAL HIGH (ref 4.0–10.5)
nRBC: 0.4 % — ABNORMAL HIGH (ref 0.0–0.2)

## 2020-07-08 MED ORDER — HEPARIN SOD (PORK) LOCK FLUSH 100 UNIT/ML IV SOLN
500.0000 [IU] | Freq: Once | INTRAVENOUS | Status: AC
  Administered 2020-07-08: 500 [IU]
  Filled 2020-07-08: qty 5

## 2020-07-08 MED ORDER — SODIUM CHLORIDE 0.9% FLUSH
10.0000 mL | Freq: Once | INTRAVENOUS | Status: AC
  Administered 2020-07-08: 10 mL
  Filled 2020-07-08: qty 10

## 2020-07-08 NOTE — Patient Instructions (Signed)

## 2020-07-15 ENCOUNTER — Inpatient Hospital Stay: Payer: 59

## 2020-07-15 ENCOUNTER — Other Ambulatory Visit: Payer: Self-pay | Admitting: Medical Oncology

## 2020-07-15 ENCOUNTER — Encounter: Payer: Self-pay | Admitting: Internal Medicine

## 2020-07-15 ENCOUNTER — Inpatient Hospital Stay (HOSPITAL_BASED_OUTPATIENT_CLINIC_OR_DEPARTMENT_OTHER): Payer: 59 | Admitting: Internal Medicine

## 2020-07-15 ENCOUNTER — Other Ambulatory Visit: Payer: Self-pay | Admitting: Physician Assistant

## 2020-07-15 ENCOUNTER — Telehealth: Payer: Self-pay | Admitting: Internal Medicine

## 2020-07-15 ENCOUNTER — Other Ambulatory Visit: Payer: Self-pay

## 2020-07-15 VITALS — BP 110/78 | HR 89 | Temp 98.2°F | Resp 18 | Ht 72.0 in | Wt 156.3 lb

## 2020-07-15 DIAGNOSIS — C3412 Malignant neoplasm of upper lobe, left bronchus or lung: Secondary | ICD-10-CM

## 2020-07-15 DIAGNOSIS — C61 Malignant neoplasm of prostate: Secondary | ICD-10-CM

## 2020-07-15 DIAGNOSIS — Z5112 Encounter for antineoplastic immunotherapy: Secondary | ICD-10-CM

## 2020-07-15 DIAGNOSIS — C3490 Malignant neoplasm of unspecified part of unspecified bronchus or lung: Secondary | ICD-10-CM | POA: Diagnosis not present

## 2020-07-15 DIAGNOSIS — Z5111 Encounter for antineoplastic chemotherapy: Secondary | ICD-10-CM

## 2020-07-15 DIAGNOSIS — D6481 Anemia due to antineoplastic chemotherapy: Secondary | ICD-10-CM

## 2020-07-15 DIAGNOSIS — I10 Essential (primary) hypertension: Secondary | ICD-10-CM

## 2020-07-15 DIAGNOSIS — T451X5A Adverse effect of antineoplastic and immunosuppressive drugs, initial encounter: Secondary | ICD-10-CM

## 2020-07-15 DIAGNOSIS — Z95828 Presence of other vascular implants and grafts: Secondary | ICD-10-CM

## 2020-07-15 LAB — CBC WITH DIFFERENTIAL (CANCER CENTER ONLY)
Abs Immature Granulocytes: 0.12 10*3/uL — ABNORMAL HIGH (ref 0.00–0.07)
Basophils Absolute: 0 10*3/uL (ref 0.0–0.1)
Basophils Relative: 1 %
Eosinophils Absolute: 0.1 10*3/uL (ref 0.0–0.5)
Eosinophils Relative: 2 %
HCT: 21.2 % — ABNORMAL LOW (ref 39.0–52.0)
Hemoglobin: 7.3 g/dL — ABNORMAL LOW (ref 13.0–17.0)
Immature Granulocytes: 2 %
Lymphocytes Relative: 12 %
Lymphs Abs: 0.9 10*3/uL (ref 0.7–4.0)
MCH: 33.8 pg (ref 26.0–34.0)
MCHC: 34.4 g/dL (ref 30.0–36.0)
MCV: 98.1 fL (ref 80.0–100.0)
Monocytes Absolute: 0.8 10*3/uL (ref 0.1–1.0)
Monocytes Relative: 10 %
Neutro Abs: 5.9 10*3/uL (ref 1.7–7.7)
Neutrophils Relative %: 73 %
Platelet Count: 299 10*3/uL (ref 150–400)
RBC: 2.16 MIL/uL — ABNORMAL LOW (ref 4.22–5.81)
RDW: 16.7 % — ABNORMAL HIGH (ref 11.5–15.5)
WBC Count: 7.9 10*3/uL (ref 4.0–10.5)
nRBC: 0 % (ref 0.0–0.2)

## 2020-07-15 LAB — CMP (CANCER CENTER ONLY)
ALT: 21 U/L (ref 0–44)
AST: 28 U/L (ref 15–41)
Albumin: 3.8 g/dL (ref 3.5–5.0)
Alkaline Phosphatase: 99 U/L (ref 38–126)
Anion gap: 7 (ref 5–15)
BUN: 14 mg/dL (ref 6–20)
CO2: 26 mmol/L (ref 22–32)
Calcium: 9.5 mg/dL (ref 8.9–10.3)
Chloride: 106 mmol/L (ref 98–111)
Creatinine: 0.85 mg/dL (ref 0.61–1.24)
GFR, Est AFR Am: >60 mL/min (ref >60)
GFR, Estimated: >60 mL/min (ref >60)
Glucose, Bld: 101 mg/dL — ABNORMAL HIGH (ref 70–99)
Potassium: 4.3 mmol/L (ref 3.5–5.1)
Sodium: 139 mmol/L (ref 135–145)
Total Bilirubin: 0.2 mg/dL — ABNORMAL LOW (ref 0.3–1.2)
Total Protein: 7.1 g/dL (ref 6.5–8.1)

## 2020-07-15 LAB — TSH: TSH: 0.961 u[IU]/mL (ref 0.320–4.118)

## 2020-07-15 MED ORDER — PALONOSETRON HCL INJECTION 0.25 MG/5ML
0.2500 mg | Freq: Once | INTRAVENOUS | Status: AC
  Administered 2020-07-15: 0.25 mg via INTRAVENOUS

## 2020-07-15 MED ORDER — SODIUM CHLORIDE 0.9 % IV SOLN
487.6000 mg | Freq: Once | INTRAVENOUS | Status: AC
  Administered 2020-07-15: 490 mg via INTRAVENOUS
  Filled 2020-07-15: qty 49

## 2020-07-15 MED ORDER — SODIUM CHLORIDE 0.9% FLUSH
10.0000 mL | INTRAVENOUS | Status: DC | PRN
  Administered 2020-07-15: 10 mL
  Filled 2020-07-15: qty 10

## 2020-07-15 MED ORDER — HEPARIN SOD (PORK) LOCK FLUSH 100 UNIT/ML IV SOLN
500.0000 [IU] | Freq: Once | INTRAVENOUS | Status: AC | PRN
  Administered 2020-07-15: 500 [IU]
  Filled 2020-07-15: qty 5

## 2020-07-15 MED ORDER — TRILACICLIB DIHYDROCHLORIDE INJECTION 300 MG
240.0000 mg/m2 | Freq: Once | INTRAVENOUS | Status: AC
  Administered 2020-07-15: 465 mg via INTRAVENOUS
  Filled 2020-07-15: qty 31

## 2020-07-15 MED ORDER — SODIUM CHLORIDE 0.9 % IV SOLN
150.0000 mg | Freq: Once | INTRAVENOUS | Status: AC
  Administered 2020-07-15: 150 mg via INTRAVENOUS
  Filled 2020-07-15: qty 150

## 2020-07-15 MED ORDER — SODIUM CHLORIDE 0.9 % IV SOLN
1500.0000 mg | Freq: Once | INTRAVENOUS | Status: AC
  Administered 2020-07-15: 1500 mg via INTRAVENOUS
  Filled 2020-07-15: qty 30

## 2020-07-15 MED ORDER — DIPHENHYDRAMINE HCL 50 MG/ML IJ SOLN
25.0000 mg | Freq: Once | INTRAMUSCULAR | Status: AC
  Administered 2020-07-15: 25 mg via INTRAVENOUS

## 2020-07-15 MED ORDER — PALONOSETRON HCL INJECTION 0.25 MG/5ML
INTRAVENOUS | Status: AC
  Filled 2020-07-15: qty 5

## 2020-07-15 MED ORDER — SODIUM CHLORIDE 0.9 % IV SOLN
80.0000 mg/m2 | Freq: Once | INTRAVENOUS | Status: AC
  Administered 2020-07-15: 150 mg via INTRAVENOUS
  Filled 2020-07-15: qty 7.5

## 2020-07-15 MED ORDER — SODIUM CHLORIDE 0.9 % IV SOLN
10.0000 mg | Freq: Once | INTRAVENOUS | Status: AC
  Administered 2020-07-15: 10 mg via INTRAVENOUS
  Filled 2020-07-15: qty 10

## 2020-07-15 MED ORDER — SODIUM CHLORIDE 0.9 % IV SOLN
Freq: Once | INTRAVENOUS | Status: AC
  Filled 2020-07-15: qty 250

## 2020-07-15 MED ORDER — SODIUM CHLORIDE 0.9% FLUSH
10.0000 mL | Freq: Once | INTRAVENOUS | Status: AC
  Administered 2020-07-15: 10 mL
  Filled 2020-07-15: qty 10

## 2020-07-15 MED ORDER — DIPHENHYDRAMINE HCL 50 MG/ML IJ SOLN
INTRAMUSCULAR | Status: AC
  Filled 2020-07-15: qty 1

## 2020-07-15 NOTE — Patient Instructions (Signed)
[facility] Discharge Instructions for Patients Receiving Chemotherapy  Today you received the following chemotherapy agents: Imfinzi, Carboplatin. Etoposide  To help prevent nausea and vomiting after your treatment, we encourage you to take your nausea medication as directed.   If you develop nausea and vomiting that is not controlled by your nausea medication, call the clinic.   BELOW ARE SYMPTOMS THAT SHOULD BE REPORTED IMMEDIATELY:  *FEVER GREATER THAN 100.5 F  *CHILLS WITH OR WITHOUT FEVER  NAUSEA AND VOMITING THAT IS NOT CONTROLLED WITH YOUR NAUSEA MEDICATION  *UNUSUAL SHORTNESS OF BREATH  *UNUSUAL BRUISING OR BLEEDING  TENDERNESS IN MOUTH AND THROAT WITH OR WITHOUT PRESENCE OF ULCERS  *URINARY PROBLEMS  *BOWEL PROBLEMS  UNUSUAL RASH Items with * indicate a potential emergency and should be followed up as soon as possible.  Feel free to call the clinic should you have any questions or concerns. The clinic phone number is [phone].  Please show the [facility] at check-in to the Emergency Department and triage nurse.

## 2020-07-15 NOTE — Patient Instructions (Signed)
Steps to Quit Smoking Smoking tobacco is the leading cause of preventable death. It can affect almost every organ in the body. Smoking puts you and people around you at risk for many serious, long-lasting (chronic) diseases. Quitting smoking can be hard, but it is one of the best things that you can do for your health. It is never too late to quit. How do I get ready to quit? When you decide to quit smoking, make a plan to help you succeed. Before you quit:  Pick a date to quit. Set a date within the next 2 weeks to give you time to prepare.  Write down the reasons why you are quitting. Keep this list in places where you will see it often.  Tell your family, friends, and co-workers that you are quitting. Their support is important.  Talk with your doctor about the choices that may help you quit.  Find out if your health insurance will pay for these treatments.  Know the people, places, things, and activities that make you want to smoke (triggers). Avoid them. What first steps can I take to quit smoking?  Throw away all cigarettes at home, at work, and in your car.  Throw away the things that you use when you smoke, such as ashtrays and lighters.  Clean your car. Make sure to empty the ashtray.  Clean your home, including curtains and carpets. What can I do to help me quit smoking? Talk with your doctor about taking medicines and seeing a counselor at the same time. You are more likely to succeed when you do both.  If you are pregnant or breastfeeding, talk with your doctor about counseling or other ways to quit smoking. Do not take medicine to help you quit smoking unless your doctor tells you to do so. To quit smoking: Quit right away  Quit smoking totally, instead of slowly cutting back on how much you smoke over a period of time.  Go to counseling. You are more likely to quit if you go to counseling sessions regularly. Take medicine You may take medicines to help you quit. Some  medicines need a prescription, and some you can buy over-the-counter. Some medicines may contain a drug called nicotine to replace the nicotine in cigarettes. Medicines may:  Help you to stop having the desire to smoke (cravings).  Help to stop the problems that come when you stop smoking (withdrawal symptoms). Your doctor may ask you to use:  Nicotine patches, gum, or lozenges.  Nicotine inhalers or sprays.  Non-nicotine medicine that is taken by mouth. Find resources Find resources and other ways to help you quit smoking and remain smoke-free after you quit. These resources are most helpful when you use them often. They include:  Online chats with a counselor.  Phone quitlines.  Printed self-help materials.  Support groups or group counseling.  Text messaging programs.  Mobile phone apps. Use apps on your mobile phone or tablet that can help you stick to your quit plan. There are many free apps for mobile phones and tablets as well as websites. Examples include Quit Guide from the CDC and smokefree.gov  What things can I do to make it easier to quit?   Talk to your family and friends. Ask them to support and encourage you.  Call a phone quitline (1-800-QUIT-NOW), reach out to support groups, or work with a counselor.  Ask people who smoke to not smoke around you.  Avoid places that make you want to smoke,   such as: ? Bars. ? Parties. ? Smoke-break areas at work.  Spend time with people who do not smoke.  Lower the stress in your life. Stress can make you want to smoke. Try these things to help your stress: ? Getting regular exercise. ? Doing deep-breathing exercises. ? Doing yoga. ? Meditating. ? Doing a body scan. To do this, close your eyes, focus on one area of your body at a time from head to toe. Notice which parts of your body are tense. Try to relax the muscles in those areas. How will I feel when I quit smoking? Day 1 to 3 weeks Within the first 24 hours,  you may start to have some problems that come from quitting tobacco. These problems are very bad 2-3 days after you quit, but they do not often last for more than 2-3 weeks. You may get these symptoms:  Mood swings.  Feeling restless, nervous, angry, or annoyed.  Trouble concentrating.  Dizziness.  Strong desire for high-sugar foods and nicotine.  Weight gain.  Trouble pooping (constipation).  Feeling like you may vomit (nausea).  Coughing or a sore throat.  Changes in how the medicines that you take for other issues work in your body.  Depression.  Trouble sleeping (insomnia). Week 3 and afterward After the first 2-3 weeks of quitting, you may start to notice more positive results, such as:  Better sense of smell and taste.  Less coughing and sore throat.  Slower heart rate.  Lower blood pressure.  Clearer skin.  Better breathing.  Fewer sick days. Quitting smoking can be hard. Do not give up if you fail the first time. Some people need to try a few times before they succeed. Do your best to stick to your quit plan, and talk with your doctor if you have any questions or concerns. Summary  Smoking tobacco is the leading cause of preventable death. Quitting smoking can be hard, but it is one of the best things that you can do for your health.  When you decide to quit smoking, make a plan to help you succeed.  Quit smoking right away, not slowly over a period of time.  When you start quitting, seek help from your doctor, family, or friends. This information is not intended to replace advice given to you by your health care provider. Make sure you discuss any questions you have with your health care provider. Document Revised: 08/04/2019 Document Reviewed: 01/28/2019 Elsevier Patient Education  2020 Elsevier Inc.  

## 2020-07-15 NOTE — Progress Notes (Signed)
Per Dr Julien Nordmann, it is okay to treat pt today with Carboplatin, VP-16 and imfinzi  with HGB 7.1

## 2020-07-15 NOTE — Telephone Encounter (Signed)
Good morning- just wanted to follow up. We're happy to talk with Richard Santos if and when you guys think he's ready to proceed with radiation since his brain has not been treated.  Thanks, Bryson Ha

## 2020-07-15 NOTE — Telephone Encounter (Signed)
Scheduled per los. Gave avs and calendar  

## 2020-07-15 NOTE — Progress Notes (Signed)
[facility] [phone]   [phone]  OFFICE PROGRESS NOTE  [name], MD [place] [address]  DIAGNOSIS: Extensive stage (T2b, N2, M1c) small cell lung cancer presented with left upper lobe pulmonary nodule in addition to left hilar mass with mediastinal invasion and solitary brain metastasis diagnosed in May 2021  PRIOR THERAPY: Palliative radiation to the left hilar and mediastinal lymphadenopathy under the care of Dr. Lisbeth Renshaw.  CURRENT THERAPY:  Palliative systemic chemotherapy with carboplatin for AUC of 5 on day 1, etoposide 100 mg/M2 on days 1, 2 and 3 as well as Imfinzi 1500 mg IV every 3 weeks with the chemotherapy.  First dose of chemotherapy May 06, 2020.  The patient will receive treatment with Cosela on the days of his chemotherapy.  Status post 3 cycles.  INTERVAL HISTORY: PRIEST LOCKRIDGE 59 y.o. male returns to the clinic today for follow-up visit accompanied by his wife.  The patient is feeling fine today with no concerning complaints except for fatigue.  He tolerated the last cycle of his chemotherapy fairly well.  He denied having any chest pain, shortness of breath, cough or hemoptysis.  He denied having any fever or chills.  He has no nausea, vomiting, diarrhea or constipation.  He denied having any headache or visual changes.  He had repeat CT scan of the chest performed recently and he is here for evaluation and discussion of his scan results.   MEDICAL HISTORY: Past Medical History:  Diagnosis Date  . Allergic rhinitis   . Allergy   . Anxiety   . Chronic low back pain   . DDD (degenerative disc disease), lumbar   . ED (erectile dysfunction)   . Finger injury    cut pads off 3rd and 4th finger left hand/due to lawn mower accident  . GERD (gastroesophageal reflux disease)   . Heart murmur    as child only-   . History of kidney stones   . HTN (hypertension) 08/20/2016  . Hyperlipidemia   .  Incomplete right bundle branch block   . Prostate cancer ([facility]) dx 10/02/14   stage T1c  . Prostate cancer ([facility])   . Sigmoid diverticulosis   . Wears glasses     ALLERGIES:  is allergic to sulfur, bee venom, and sulfa antibiotics.  MEDICATIONS:  Current Outpatient Medications  Medication Sig Dispense Refill  . acetaminophen (TYLENOL) 325 MG tablet Take 2 tablets (650 mg total) by mouth every 6 (six) hours as needed for mild pain (or Fever >/= 101). 30 tablet 0  . aspirin EC 81 MG tablet Take 81 mg by mouth daily. Swallow whole.    Marland Kitchen atorvastatin (LIPITOR) 40 MG tablet Take 1 tablet (40 mg total) by mouth daily. 90 tablet 3  . busPIRone (BUSPAR) 10 MG tablet Take 1 tablet (10 mg total) by mouth 2 (two) times daily. 180 tablet 3  . EPINEPHrine (EPIPEN) 0.3 mg/0.3 mL IJ SOAJ injection Inject 0.3 mLs (0.3 mg total) into the muscle once. 2 Device 2  . folic acid (FOLVITE) 1 MG tablet Take 1 tablet (1 mg total) by mouth daily. (Patient not taking: Reported on 05/24/2020) 30 tablet 0  . HYDROcodone-acetaminophen (HYCET) 7.5-325 mg/15 ml solution Take 10 mLs by mouth 4 (four) times daily as needed for moderate pain. 473 mL 0  . lidocaine (XYLOCAINE) 2 % solution Use as directed 10 mLs in the mouth or throat every 6 (six) hours as  needed for mouth pain. 450 mL 1  . losartan (COZAAR) 50 MG tablet Take 1 tablet (50 mg total) by mouth daily. (Patient not taking: Reported on 05/24/2020) 90 tablet 3  . meloxicam (MOBIC) 15 MG tablet TAKE 1 TABLET BY MOUTH EVERY DAY AS NEEDED FOR PAIN (Patient taking differently: Take 15 mg by mouth daily as needed for pain. ) 30 tablet 2  . Multiple Vitamin (MULTIVITAMIN) capsule Take 1 capsule by mouth daily.    . nicotine (NICODERM CQ - DOSED IN MG/24 HOURS) 21 mg/24hr patch Place 1 patch (21 mg total) onto the skin daily. 28 patch 0  . nicotine (NICODERM CQ) 14 mg/24hr patch Place 1 patch (14 mg total) onto the skin daily. 28 patch 3  . nicotine polacrilex (COMMIT) 4 MG  lozenge Take 1 lozenge (4 mg total) by mouth as needed for smoking cessation. 108 tablet 6  . pantoprazole (PROTONIX) 40 MG tablet Take 1 tablet (40 mg total) by mouth daily before breakfast. 90 tablet 3  . phenazopyridine (PYRIDIUM) 200 MG tablet Take 1 tablet (200 mg total) by mouth 3 (three) times daily as needed for pain. 12 tablet 0  . prochlorperazine (COMPAZINE) 10 MG tablet Take 1 tablet (10 mg total) by mouth every 6 (six) hours as needed for nausea or vomiting. 30 tablet 0  . sucralfate (CARAFATE) 1 g tablet PLEASE SEE ATTACHED FOR DETAILED DIRECTIONS 120 tablet 0  . traMADol (ULTRAM) 50 MG tablet TAKE HALF TABLET BY MOUTH 2 TIMES DAILY AS NEEDED FOR PAIN (Patient taking differently: Take 25 mg by mouth every 12 (twelve) hours as needed for moderate pain. TAKE HALF TABLET BY MOUTH 2 TIMES DAILY AS NEEDED FOR PAIN) 60 tablet 2  . triamcinolone cream (KENALOG) 0.5 % Apply 1 application topically 3 (three) times daily. 90 g 1   No current facility-administered medications for this visit.    SURGICAL HISTORY:  Past Surgical History:  Procedure Laterality Date  . BRONCHIAL BIOPSY  04/12/2020   Procedure: BRONCHIAL BIOPSIES;  Surgeon: Marshell Garfinkel, MD;  Location: [facility];  Service: Cardiopulmonary;;  . BRONCHIAL BRUSHINGS  04/12/2020   Procedure: BRONCHIAL BRUSHINGS;  Surgeon: Marshell Garfinkel, MD;  Location: [facility];  Service: Cardiopulmonary;;  . BRONCHIAL NEEDLE ASPIRATION BIOPSY  04/12/2020   Procedure: BRONCHIAL NEEDLE ASPIRATION BIOPSIES;  Surgeon: Marshell Garfinkel, MD;  Location: [facility];  Service: Cardiopulmonary;;  . BRONCHIAL WASHINGS  04/12/2020   Procedure: BRONCHIAL WASHINGS;  Surgeon: Marshell Garfinkel, MD;  Location: [facility];  Service: Cardiopulmonary;;  . COLONOSCOPY    . COLONOSCOPY W/ POLYPECTOMY  04-19-2012  . ENDOBRONCHIAL ULTRASOUND N/A 04/12/2020   Procedure: ENDOBRONCHIAL ULTRASOUND;  Surgeon: Marshell Garfinkel, MD;  Location: [facility];  Service:  Cardiopulmonary;  Laterality: N/A;  . ESOPHAGOGASTRODUODENOSCOPY  08-05-2010  . HEMOSTASIS CONTROL  04/12/2020   Procedure: HEMOSTASIS CONTROL;  Surgeon: Marshell Garfinkel, MD;  Location: [facility];  Service: Cardiopulmonary;;  . IR IMAGING GUIDED PORT INSERTION  05/31/2020  . POLYPECTOMY    . PROSTATE BIOPSY  10/02/14  . RADIOACTIVE SEED IMPLANT N/A 01/30/2015   Procedure: RADIOACTIVE SEED IMPLANT;  Surgeon: Rana Snare, MD;  Location[facility];  Service: Urology;  Laterality: N/A;  . UPPER GASTROINTESTINAL ENDOSCOPY    . VIDEO BRONCHOSCOPY N/A 04/12/2020   Procedure: VIDEO BRONCHOSCOPY WITHOUT FLUORO;  Surgeon: Marshell Garfinkel, MD;  Location: [facility];  Service: Cardiopulmonary;  Laterality: N/A;    REVIEW OF SYSTEMS:  Constitutional: positive for fatigue Eyes: negative Ears, nose, mouth, throat,  and face: negative Respiratory: negative Cardiovascular: negative Gastrointestinal: negative Genitourinary:negative Integument/breast: negative Hematologic/lymphatic: negative Musculoskeletal:negative Neurological: negative Behavioral/Psych: negative Endocrine: negative Allergic/Immunologic: negative   PHYSICAL EXAMINATION: General appearance: alert, cooperative, fatigued and no distress Head: Normocephalic, without obvious abnormality, atraumatic Neck: no adenopathy, no JVD, supple, symmetrical, trachea midline and thyroid not enlarged, symmetric, no tenderness/mass/nodules Lymph nodes: Cervical, supraclavicular, and axillary nodes normal. Resp: clear to auscultation bilaterally Back: symmetric, no curvature. ROM normal. No CVA tenderness. Cardio: regular rate and rhythm, S1, S2 normal, no murmur, click, rub or gallop GI: soft, non-tender; bowel sounds normal; no masses,  no organomegaly Extremities: extremities normal, atraumatic, no cyanosis or edema Neurologic: Alert and oriented X 3, normal strength and tone. Normal symmetric reflexes. Normal coordination and  gait  ECOG PERFORMANCE STATUS: 1 - Symptomatic but completely ambulatory  Blood pressure 110/78, pulse 89, temperature 98.2 F (36.8 C), temperature source Tympanic, resp. rate 18, height 6' (1.829 m), weight 156 lb 4.8 oz (70.9 kg), SpO2 100 %.  LABORATORY DATA: Lab Results  Component Value Date   WBC 7.9 07/15/2020   HGB 7.3 (L) 07/15/2020   HCT 21.2 (L) 07/15/2020   MCV 98.1 07/15/2020   PLT 299 07/15/2020      Chemistry      Component Value Date/Time   NA 140 07/08/2020 1144   K 4.1 07/08/2020 1144   CL 105 07/08/2020 1144   CO2 25 07/08/2020 1144   BUN 12 07/08/2020 1144   CREATININE 0.92 07/08/2020 1144      Component Value Date/Time   CALCIUM 9.7 07/08/2020 1144   ALKPHOS 107 07/08/2020 1144   AST 23 07/08/2020 1144   ALT 20 07/08/2020 1144   BILITOT 0.2 (L) 07/08/2020 1144       RADIOGRAPHIC STUDIES: CT Chest W Contrast  Result Date: 06/28/2020 CLINICAL DATA:  Primary Cancer Type: Lung Imaging Indication: Assess response to therapy Interval therapy since last imaging? Yes Initial Cancer Diagnosis Date: 04/12/2020; Established by: Biopsy-proven Detailed Pathology: Extensive stage small cell lung cancer. Primary Tumor location: Left hilum and left upper lobe. Solitary brain metastasis. Surgeries: No Chemotherapy: Yes; Ongoing? Yes; Most recent administration: 06/24/2020 Immunotherapy?  Yes; Type: Imfinzi; Ongoing? Yes Radiation therapy? Yes; Date Range: 04/25/2020-05/15/2020; Target: Left lung Other Cancers: Prostate cancer with brachytherapy seeds 2016. EXAM: CT CHEST, ABDOMEN, AND PELVIS WITH CONTRAST TECHNIQUE: Multidetector CT imaging of the chest, abdomen and pelvis was performed following the standard protocol during bolus administration of intravenous contrast. CONTRAST:  162mL OMNIPAQUE IOHEXOL 300 MG/ML  SOLN COMPARISON:  05/02/2020 PET-CT.  CT chest 04/11/2020. FINDINGS: CT CHEST FINDINGS Cardiovascular: The heart is normal in size. No pericardial effusion. No  evidence of thoracic aortic aneurysm. Mild atherosclerotic calcifications of the aortic arch. Coronary atherosclerosis of the LAD and right coronary artery. Right chest port terminates at the cavoatrial junction. Mediastinum/Nodes: No suspicious mediastinal lymphadenopathy. Near complete resolution of abnormal soft tissue in the AP window/left suprahilar region (series 2/image 28). No suspicious axillary lymphadenopathy. Visualized thyroid is unremarkable. Lungs/Pleura: 1.7 x 2.9 cm left upper lobe nodule (series 6/image 41), previously reflecting a 2.3 x 3.5 cm mixed density mass, corresponding to the patient's known primary bronchogenic neoplasm. Mass abuts/involves the lateral chest wall (series 6/image 38). Associated mild adjacent satellite nodularity (series 6/image 32). Faint linear scarring in the medial left upper lobe (series 6/image 80), at the site of prior nodular opacity, improved. Biapical pleural-parenchymal scarring. Mild centrilobular and paraseptal emphysematous changes, upper lung predominant. No focal consolidation. No pleural effusion or  pneumothorax. Musculoskeletal: Degenerative changes of the lower thoracic spine. CT ABDOMEN PELVIS FINDINGS Hepatobiliary: Liver is within normal limits. Gallbladder is unremarkable. No intrahepatic or extrahepatic ductal dilatation. Pancreas: Within normal limits. Spleen: Within normal limits. Adrenals/Urinary Tract: Adrenal glands are within normal limits. 3 mm nonobstructing interpolar left renal calculus (series 2/image 75). 15 mm right lower pole renal cyst (series 2/image 85). No hydronephrosis. Bladder is mildly thick-walled although underdistended. Stomach/Bowel: Stomach is within normal limits. No evidence of bowel obstruction. Normal appendix (series 2/image 112). Vascular/Lymphatic: No evidence of abdominal aortic aneurysm. Atherosclerotic calcifications of the abdominal aorta and branch vessels. No suspicious abdominopelvic lymphadenopathy.  Reproductive: Brachytherapy seeds in the prostate. Other: No abdominopelvic ascites. Musculoskeletal: Degenerative changes of the lower lumbar spine. IMPRESSION: 2.9 cm left upper lobe nodule, decreased, corresponding to the patient's known primary bronchogenic neoplasm. Prior medial left upper lobe nodule has improved. Prior left suprahilar/AP window nodal mass can no longer be discretely measured. No evidence of metastatic disease in the abdomen/pelvis. Brachytherapy seeds in the prostate. Electronically Signed   By: Julian Hy M.D.   On: 06/28/2020 10:22   CT Abdomen Pelvis W Contrast  Result Date: 06/28/2020 CLINICAL DATA:  Primary Cancer Type: Lung Imaging Indication: Assess response to therapy Interval therapy since last imaging? Yes Initial Cancer Diagnosis Date: 04/12/2020; Established by: Biopsy-proven Detailed Pathology: Extensive stage small cell lung cancer. Primary Tumor location: Left hilum and left upper lobe. Solitary brain metastasis. Surgeries: No Chemotherapy: Yes; Ongoing? Yes; Most recent administration: 06/24/2020 Immunotherapy?  Yes; Type: Imfinzi; Ongoing? Yes Radiation therapy? Yes; Date Range: 04/25/2020-05/15/2020; Target: Left lung Other Cancers: Prostate cancer with brachytherapy seeds 2016. EXAM: CT CHEST, ABDOMEN, AND PELVIS WITH CONTRAST TECHNIQUE: Multidetector CT imaging of the chest, abdomen and pelvis was performed following the standard protocol during bolus administration of intravenous contrast. CONTRAST:  144mL OMNIPAQUE IOHEXOL 300 MG/ML  SOLN COMPARISON:  05/02/2020 PET-CT.  CT chest 04/11/2020. FINDINGS: CT CHEST FINDINGS Cardiovascular: The heart is normal in size. No pericardial effusion. No evidence of thoracic aortic aneurysm. Mild atherosclerotic calcifications of the aortic arch. Coronary atherosclerosis of the LAD and right coronary artery. Right chest port terminates at the cavoatrial junction. Mediastinum/Nodes: No suspicious mediastinal lymphadenopathy.  Near complete resolution of abnormal soft tissue in the AP window/left suprahilar region (series 2/image 28). No suspicious axillary lymphadenopathy. Visualized thyroid is unremarkable. Lungs/Pleura: 1.7 x 2.9 cm left upper lobe nodule (series 6/image 41), previously reflecting a 2.3 x 3.5 cm mixed density mass, corresponding to the patient's known primary bronchogenic neoplasm. Mass abuts/involves the lateral chest wall (series 6/image 38). Associated mild adjacent satellite nodularity (series 6/image 32). Faint linear scarring in the medial left upper lobe (series 6/image 80), at the site of prior nodular opacity, improved. Biapical pleural-parenchymal scarring. Mild centrilobular and paraseptal emphysematous changes, upper lung predominant. No focal consolidation. No pleural effusion or pneumothorax. Musculoskeletal: Degenerative changes of the lower thoracic spine. CT ABDOMEN PELVIS FINDINGS Hepatobiliary: Liver is within normal limits. Gallbladder is unremarkable. No intrahepatic or extrahepatic ductal dilatation. Pancreas: Within normal limits. Spleen: Within normal limits. Adrenals/Urinary Tract: Adrenal glands are within normal limits. 3 mm nonobstructing interpolar left renal calculus (series 2/image 75). 15 mm right lower pole renal cyst (series 2/image 85). No hydronephrosis. Bladder is mildly thick-walled although underdistended. Stomach/Bowel: Stomach is within normal limits. No evidence of bowel obstruction. Normal appendix (series 2/image 112). Vascular/Lymphatic: No evidence of abdominal aortic aneurysm. Atherosclerotic calcifications of the abdominal aorta and branch vessels. No suspicious abdominopelvic lymphadenopathy. Reproductive: Brachytherapy seeds  in the prostate. Other: No abdominopelvic ascites. Musculoskeletal: Degenerative changes of the lower lumbar spine. IMPRESSION: 2.9 cm left upper lobe nodule, decreased, corresponding to the patient's known primary bronchogenic neoplasm. Prior  medial left upper lobe nodule has improved. Prior left suprahilar/AP window nodal mass can no longer be discretely measured. No evidence of metastatic disease in the abdomen/pelvis. Brachytherapy seeds in the prostate. Electronically Signed   By: Julian Hy M.D.   On: 06/28/2020 10:22    ASSESSMENT AND PLAN: This is a very pleasant 59 years old white male recently diagnosed with extensive stage small cell lung cancer presented with left upper lobe lung mass in addition to left hilar and mediastinal lymphadenopathy as well as solitary brain metastasis diagnosed in May 2021. He underwent palliative radiotherapy to the left hilar and mediastinal lymphadenopathy in addition to the radiotherapy to the brain metastasis.  He continues to have residual odynophagia and dysphagia from the palliative radiotherapy. He is currently undergoing systemic chemotherapy with carboplatin for AUC of 5 on day 1, etoposide 100 mg/M2 on days 1, 2 and 3 with Cosela infusion before chemotherapy.  He is status post 3 cycles of treatment  The patient continues to tolerate his treatment well with no concerning adverse effects. He had repeat CT scan of the chest performed recently.  I personally and independently reviewed the scans and discussed the results with the patient and his wife and showed them the images. His scan showed improvement of his disease. I recommended for the patient to continue with cycle #4 today as planned but I will reduce the dose of carboplatin to AUC of 4 on day 1 and etoposide 80 mg/M2 on days 1, 2 and 3. The patient will come back for follow-up visit in 3 weeks for evaluation after repeating CT scan of the chest as well as MRI of the brain for restaging of his disease. If the patient has complete response to this treatment, I will refer him to radiation oncology for consideration of prophylactic or whole brain irradiation depending on the MRI results. For the chemotherapy-induced anemia, I will  arrange for the patient to receive 2 units of PRBCs transfusion in the next few days. The patient was advised to call immediately if he has any concerning symptoms in the interval. The patient voices understanding of current disease status and treatment options and is in agreement with the current care plan.  All questions were answered. The patient knows to call the clinic with any problems, questions or concerns. We can certainly see the patient much sooner if necessary.   Disclaimer: This note was dictated with voice recognition software. Similar sounding words can inadvertently be transcribed and may not be corrected upon review.

## 2020-07-16 ENCOUNTER — Other Ambulatory Visit: Payer: Self-pay

## 2020-07-16 ENCOUNTER — Inpatient Hospital Stay: Payer: 59

## 2020-07-16 ENCOUNTER — Other Ambulatory Visit: Payer: Self-pay | Admitting: Medical Oncology

## 2020-07-16 ENCOUNTER — Ambulatory Visit: Payer: 59 | Admitting: Pulmonary Disease

## 2020-07-16 VITALS — BP 126/71 | HR 94 | Temp 98.0°F | Resp 16

## 2020-07-16 DIAGNOSIS — Z95828 Presence of other vascular implants and grafts: Secondary | ICD-10-CM

## 2020-07-16 DIAGNOSIS — C3412 Malignant neoplasm of upper lobe, left bronchus or lung: Secondary | ICD-10-CM

## 2020-07-16 DIAGNOSIS — T451X5A Adverse effect of antineoplastic and immunosuppressive drugs, initial encounter: Secondary | ICD-10-CM

## 2020-07-16 DIAGNOSIS — D6481 Anemia due to antineoplastic chemotherapy: Secondary | ICD-10-CM

## 2020-07-16 DIAGNOSIS — Z5112 Encounter for antineoplastic immunotherapy: Secondary | ICD-10-CM | POA: Diagnosis not present

## 2020-07-16 LAB — ABO/RH: ABO/RH(D): A NEG

## 2020-07-16 LAB — SAMPLE TO BLOOD BANK

## 2020-07-16 LAB — PREPARE RBC (CROSSMATCH)

## 2020-07-16 MED ORDER — SODIUM CHLORIDE 0.9 % IV SOLN
80.0000 mg/m2 | Freq: Once | INTRAVENOUS | Status: AC
  Administered 2020-07-16: 150 mg via INTRAVENOUS
  Filled 2020-07-16: qty 7.5

## 2020-07-16 MED ORDER — DIPHENHYDRAMINE HCL 50 MG/ML IJ SOLN
INTRAMUSCULAR | Status: AC
  Filled 2020-07-16: qty 1

## 2020-07-16 MED ORDER — SODIUM CHLORIDE 0.9% FLUSH
10.0000 mL | INTRAVENOUS | Status: DC | PRN
  Administered 2020-07-16: 10 mL
  Filled 2020-07-16: qty 10

## 2020-07-16 MED ORDER — SODIUM CHLORIDE 0.9 % IV SOLN
10.0000 mg | Freq: Once | INTRAVENOUS | Status: AC
  Administered 2020-07-16: 10 mg via INTRAVENOUS
  Filled 2020-07-16: qty 10

## 2020-07-16 MED ORDER — DIPHENHYDRAMINE HCL 50 MG/ML IJ SOLN
25.0000 mg | Freq: Once | INTRAMUSCULAR | Status: AC
  Administered 2020-07-16: 25 mg via INTRAVENOUS

## 2020-07-16 MED ORDER — TRILACICLIB DIHYDROCHLORIDE INJECTION 300 MG
240.0000 mg/m2 | Freq: Once | INTRAVENOUS | Status: AC
  Administered 2020-07-16: 465 mg via INTRAVENOUS
  Filled 2020-07-16: qty 31

## 2020-07-16 MED ORDER — HEPARIN SOD (PORK) LOCK FLUSH 100 UNIT/ML IV SOLN
500.0000 [IU] | Freq: Once | INTRAVENOUS | Status: AC | PRN
  Administered 2020-07-16: 500 [IU]
  Filled 2020-07-16: qty 5

## 2020-07-16 MED ORDER — SODIUM CHLORIDE 0.9 % IV SOLN
Freq: Once | INTRAVENOUS | Status: AC
  Filled 2020-07-16: qty 250

## 2020-07-16 MED ORDER — SODIUM CHLORIDE 0.9% FLUSH
10.0000 mL | Freq: Once | INTRAVENOUS | Status: AC
  Administered 2020-07-16: 10 mL
  Filled 2020-07-16: qty 10

## 2020-07-16 NOTE — Patient Instructions (Signed)
[facility] Discharge Instructions for Patients Receiving Chemotherapy  Today you received the following chemotherapy agents: etoposide  To help prevent nausea and vomiting after your treatment, we encourage you to take your nausea medication as directed.   If you develop nausea and vomiting that is not controlled by your nausea medication, call the clinic.   BELOW ARE SYMPTOMS THAT SHOULD BE REPORTED IMMEDIATELY:  *FEVER GREATER THAN 100.5 F  *CHILLS WITH OR WITHOUT FEVER  NAUSEA AND VOMITING THAT IS NOT CONTROLLED WITH YOUR NAUSEA MEDICATION  *UNUSUAL SHORTNESS OF BREATH  *UNUSUAL BRUISING OR BLEEDING  TENDERNESS IN MOUTH AND THROAT WITH OR WITHOUT PRESENCE OF ULCERS  *URINARY PROBLEMS  *BOWEL PROBLEMS  UNUSUAL RASH Items with * indicate a potential emergency and should be followed up as soon as possible.  Feel free to call the clinic should you have any questions or concerns. The clinic phone number is [phone].  Please show the CHEMO ALERT CARD at check-in to the Emergency Department and triage nurse.   

## 2020-07-17 ENCOUNTER — Inpatient Hospital Stay: Payer: 59

## 2020-07-17 ENCOUNTER — Telehealth: Payer: Self-pay | Admitting: Medical Oncology

## 2020-07-17 ENCOUNTER — Other Ambulatory Visit: Payer: Self-pay | Admitting: Physician Assistant

## 2020-07-17 ENCOUNTER — Other Ambulatory Visit: Payer: Self-pay

## 2020-07-17 VITALS — BP 122/75 | HR 90 | Temp 98.3°F | Resp 17

## 2020-07-17 DIAGNOSIS — Z5112 Encounter for antineoplastic immunotherapy: Secondary | ICD-10-CM | POA: Diagnosis not present

## 2020-07-17 DIAGNOSIS — C3412 Malignant neoplasm of upper lobe, left bronchus or lung: Secondary | ICD-10-CM

## 2020-07-17 MED ORDER — HEPARIN SOD (PORK) LOCK FLUSH 100 UNIT/ML IV SOLN
500.0000 [IU] | Freq: Once | INTRAVENOUS | Status: AC | PRN
  Administered 2020-07-17: 500 [IU]
  Filled 2020-07-17: qty 5

## 2020-07-17 MED ORDER — LIDOCAINE-PRILOCAINE 2.5-2.5 % EX CREA: 1.0000 "application " | TOPICAL_CREAM | CUTANEOUS | 2 refills | Status: DC | PRN

## 2020-07-17 MED ORDER — SODIUM CHLORIDE 0.9 % IV SOLN
10.0000 mg | Freq: Once | INTRAVENOUS | Status: AC
  Administered 2020-07-17: 10 mg via INTRAVENOUS
  Filled 2020-07-17: qty 10

## 2020-07-17 MED ORDER — DIPHENHYDRAMINE HCL 50 MG/ML IJ SOLN
INTRAMUSCULAR | Status: AC
  Filled 2020-07-17: qty 1

## 2020-07-17 MED ORDER — TRILACICLIB DIHYDROCHLORIDE INJECTION 300 MG
240.0000 mg/m2 | Freq: Once | INTRAVENOUS | Status: AC
  Administered 2020-07-17: 465 mg via INTRAVENOUS
  Filled 2020-07-17: qty 31

## 2020-07-17 MED ORDER — SODIUM CHLORIDE 0.9% FLUSH
10.0000 mL | INTRAVENOUS | Status: DC | PRN
  Administered 2020-07-17: 10 mL
  Filled 2020-07-17: qty 10

## 2020-07-17 MED ORDER — SODIUM CHLORIDE 0.9 % IV SOLN
80.0000 mg/m2 | Freq: Once | INTRAVENOUS | Status: AC
  Administered 2020-07-17: 150 mg via INTRAVENOUS
  Filled 2020-07-17: qty 7.5

## 2020-07-17 MED ORDER — SODIUM CHLORIDE 0.9 % IV SOLN
Freq: Once | INTRAVENOUS | Status: AC
  Filled 2020-07-17: qty 250

## 2020-07-17 MED ORDER — DIPHENHYDRAMINE HCL 50 MG/ML IJ SOLN
25.0000 mg | Freq: Once | INTRAMUSCULAR | Status: AC
  Administered 2020-07-17: 25 mg via INTRAVENOUS

## 2020-07-17 NOTE — Telephone Encounter (Signed)
Pt asking if he will get prot flushed today . I told him yes .

## 2020-07-17 NOTE — Patient Instructions (Signed)
[facility] Discharge Instructions for Patients Receiving Chemotherapy  Today you received the following chemotherapy agents: Etoposide.  To help prevent nausea and vomiting after your treatment, we encourage you to take your nausea medication as directed.   If you develop nausea and vomiting that is not controlled by your nausea medication, call the clinic.   BELOW ARE SYMPTOMS THAT SHOULD BE REPORTED IMMEDIATELY:  *FEVER GREATER THAN 100.5 F  *CHILLS WITH OR WITHOUT FEVER  NAUSEA AND VOMITING THAT IS NOT CONTROLLED WITH YOUR NAUSEA MEDICATION  *UNUSUAL SHORTNESS OF BREATH  *UNUSUAL BRUISING OR BLEEDING  TENDERNESS IN MOUTH AND THROAT WITH OR WITHOUT PRESENCE OF ULCERS  *URINARY PROBLEMS  *BOWEL PROBLEMS  UNUSUAL RASH Items with * indicate a potential emergency and should be followed up as soon as possible.  Feel free to call the clinic should you have any questions or concerns. The clinic phone number is [phone].  Please show the [place] at check-in to the Emergency Department and triage nurse.

## 2020-07-18 ENCOUNTER — Other Ambulatory Visit: Payer: Self-pay

## 2020-07-18 ENCOUNTER — Inpatient Hospital Stay: Payer: 59

## 2020-07-18 DIAGNOSIS — T451X5A Adverse effect of antineoplastic and immunosuppressive drugs, initial encounter: Secondary | ICD-10-CM

## 2020-07-18 DIAGNOSIS — Z5112 Encounter for antineoplastic immunotherapy: Secondary | ICD-10-CM | POA: Diagnosis not present

## 2020-07-18 DIAGNOSIS — D6481 Anemia due to antineoplastic chemotherapy: Secondary | ICD-10-CM

## 2020-07-18 MED ORDER — HEPARIN SOD (PORK) LOCK FLUSH 100 UNIT/ML IV SOLN
500.0000 [IU] | Freq: Every day | INTRAVENOUS | Status: AC | PRN
  Administered 2020-07-18: 500 [IU]
  Filled 2020-07-18: qty 5

## 2020-07-18 MED ORDER — ACETAMINOPHEN 325 MG PO TABS
650.0000 mg | ORAL_TABLET | Freq: Once | ORAL | Status: AC
  Administered 2020-07-18: 650 mg via ORAL

## 2020-07-18 MED ORDER — SODIUM CHLORIDE 0.9% FLUSH
10.0000 mL | INTRAVENOUS | Status: AC | PRN
  Administered 2020-07-18: 10 mL
  Filled 2020-07-18: qty 10

## 2020-07-18 MED ORDER — SODIUM CHLORIDE 0.9% IV SOLUTION
250.0000 mL | Freq: Once | INTRAVENOUS | Status: AC
  Administered 2020-07-18: 250 mL via INTRAVENOUS
  Filled 2020-07-18: qty 250

## 2020-07-18 MED ORDER — DIPHENHYDRAMINE HCL 25 MG PO CAPS
ORAL_CAPSULE | ORAL | Status: AC
  Filled 2020-07-18: qty 1

## 2020-07-18 MED ORDER — DIPHENHYDRAMINE HCL 25 MG PO CAPS
25.0000 mg | ORAL_CAPSULE | Freq: Once | ORAL | Status: AC
  Administered 2020-07-18: 25 mg via ORAL

## 2020-07-18 MED ORDER — ACETAMINOPHEN 325 MG PO TABS
ORAL_TABLET | ORAL | Status: AC
  Filled 2020-07-18: qty 2

## 2020-07-18 NOTE — Patient Instructions (Signed)

## 2020-07-19 ENCOUNTER — Inpatient Hospital Stay: Payer: 59

## 2020-07-19 ENCOUNTER — Other Ambulatory Visit: Payer: Self-pay

## 2020-07-19 VITALS — BP 129/79 | HR 88 | Temp 98.5°F | Resp 16

## 2020-07-19 DIAGNOSIS — C3412 Malignant neoplasm of upper lobe, left bronchus or lung: Secondary | ICD-10-CM

## 2020-07-19 DIAGNOSIS — Z5112 Encounter for antineoplastic immunotherapy: Secondary | ICD-10-CM | POA: Diagnosis not present

## 2020-07-19 LAB — TYPE AND SCREEN
ABO/RH(D): A NEG
Antibody Screen: NEGATIVE
Unit division: 0
Unit division: 0

## 2020-07-19 LAB — BPAM RBC
Blood Product Expiration Date: 202109062359
Blood Product Expiration Date: 202109092359
ISSUE DATE / TIME: 202108260839
ISSUE DATE / TIME: 202108260839
Unit Type and Rh: 600
Unit Type and Rh: 600

## 2020-07-19 MED ORDER — PEGFILGRASTIM-JMDB 6 MG/0.6ML ~~LOC~~ SOSY
6.0000 mg | PREFILLED_SYRINGE | Freq: Once | SUBCUTANEOUS | Status: AC
  Administered 2020-07-19: 6 mg via SUBCUTANEOUS

## 2020-07-19 MED ORDER — PEGFILGRASTIM-JMDB 6 MG/0.6ML ~~LOC~~ SOSY
PREFILLED_SYRINGE | SUBCUTANEOUS | Status: AC
  Filled 2020-07-19: qty 0.6

## 2020-07-19 NOTE — Patient Instructions (Signed)

## 2020-07-20 ENCOUNTER — Other Ambulatory Visit: Payer: Self-pay | Admitting: Internal Medicine

## 2020-07-21 NOTE — Telephone Encounter (Signed)
Please refill as per office routine med refill policy (all routine meds refilled for 3 mo or monthly per pt preference up to one year from last visit, then month to month grace period for 3 mo, then further med refills will have to be denied)  

## 2020-07-22 ENCOUNTER — Inpatient Hospital Stay: Payer: 59

## 2020-07-22 ENCOUNTER — Other Ambulatory Visit: Payer: Self-pay

## 2020-07-22 DIAGNOSIS — Z5112 Encounter for antineoplastic immunotherapy: Secondary | ICD-10-CM | POA: Diagnosis not present

## 2020-07-22 DIAGNOSIS — C3412 Malignant neoplasm of upper lobe, left bronchus or lung: Secondary | ICD-10-CM

## 2020-07-22 LAB — CMP (CANCER CENTER ONLY)
ALT: 19 U/L (ref 0–44)
AST: 21 U/L (ref 15–41)
Albumin: 3.9 g/dL (ref 3.5–5.0)
Alkaline Phosphatase: 125 U/L (ref 38–126)
Anion gap: 7 (ref 5–15)
BUN: 18 mg/dL (ref 6–20)
CO2: 29 mmol/L (ref 22–32)
Calcium: 10.4 mg/dL — ABNORMAL HIGH (ref 8.9–10.3)
Chloride: 100 mmol/L (ref 98–111)
Creatinine: 0.84 mg/dL (ref 0.61–1.24)
GFR, Est AFR Am: >60 mL/min (ref >60)
GFR, Estimated: >60 mL/min (ref >60)
Glucose, Bld: 122 mg/dL — ABNORMAL HIGH (ref 70–99)
Potassium: 4.3 mmol/L (ref 3.5–5.1)
Sodium: 136 mmol/L (ref 135–145)
Total Bilirubin: 0.7 mg/dL (ref 0.3–1.2)
Total Protein: 7.1 g/dL (ref 6.5–8.1)

## 2020-07-22 LAB — CBC WITH DIFFERENTIAL (CANCER CENTER ONLY)
Abs Immature Granulocytes: 0 10*3/uL (ref 0.00–0.07)
Band Neutrophils: 1 %
Basophils Absolute: 0 10*3/uL (ref 0.0–0.1)
Basophils Relative: 0 %
Eosinophils Absolute: 0 10*3/uL (ref 0.0–0.5)
Eosinophils Relative: 0 %
HCT: 27.5 % — ABNORMAL LOW (ref 39.0–52.0)
Hemoglobin: 9 g/dL — ABNORMAL LOW (ref 13.0–17.0)
Lymphocytes Relative: 4 %
Lymphs Abs: 1.4 10*3/uL (ref 0.7–4.0)
MCH: 30.8 pg (ref 26.0–34.0)
MCHC: 32.7 g/dL (ref 30.0–36.0)
MCV: 94.2 fL (ref 80.0–100.0)
Monocytes Absolute: 0.3 10*3/uL (ref 0.1–1.0)
Monocytes Relative: 1 %
Neutro Abs: 32.5 10*3/uL — ABNORMAL HIGH (ref 1.7–7.7)
Neutrophils Relative %: 94 %
Platelet Count: 238 10*3/uL (ref 150–400)
RBC: 2.92 MIL/uL — ABNORMAL LOW (ref 4.22–5.81)
RDW: 18.6 % — ABNORMAL HIGH (ref 11.5–15.5)
WBC Count: 34.2 10*3/uL — ABNORMAL HIGH (ref 4.0–10.5)
nRBC: 0 % (ref 0.0–0.2)

## 2020-07-23 ENCOUNTER — Telehealth: Payer: Self-pay | Admitting: Internal Medicine

## 2020-07-23 NOTE — Telephone Encounter (Signed)
Scheduled appt per 8/30 sch msg - pt is aware of apt date and time

## 2020-07-30 ENCOUNTER — Inpatient Hospital Stay: Payer: 59

## 2020-08-02 ENCOUNTER — Ambulatory Visit (HOSPITAL_COMMUNITY): Payer: 59

## 2020-08-02 ENCOUNTER — Inpatient Hospital Stay: Payer: 59 | Attending: Internal Medicine

## 2020-08-02 ENCOUNTER — Other Ambulatory Visit: Payer: Self-pay | Admitting: Medical Oncology

## 2020-08-02 ENCOUNTER — Ambulatory Visit (HOSPITAL_COMMUNITY): Admission: RE | Admit: 2020-08-02 | Payer: 59 | Source: Ambulatory Visit

## 2020-08-02 DIAGNOSIS — Z9221 Personal history of antineoplastic chemotherapy: Secondary | ICD-10-CM | POA: Insufficient documentation

## 2020-08-02 DIAGNOSIS — I10 Essential (primary) hypertension: Secondary | ICD-10-CM | POA: Insufficient documentation

## 2020-08-02 DIAGNOSIS — Z79899 Other long term (current) drug therapy: Secondary | ICD-10-CM | POA: Insufficient documentation

## 2020-08-02 DIAGNOSIS — Z923 Personal history of irradiation: Secondary | ICD-10-CM | POA: Insufficient documentation

## 2020-08-02 DIAGNOSIS — Z791 Long term (current) use of non-steroidal anti-inflammatories (NSAID): Secondary | ICD-10-CM | POA: Insufficient documentation

## 2020-08-02 DIAGNOSIS — Z7982 Long term (current) use of aspirin: Secondary | ICD-10-CM | POA: Insufficient documentation

## 2020-08-02 DIAGNOSIS — E785 Hyperlipidemia, unspecified: Secondary | ICD-10-CM | POA: Insufficient documentation

## 2020-08-02 DIAGNOSIS — Z8546 Personal history of malignant neoplasm of prostate: Secondary | ICD-10-CM | POA: Insufficient documentation

## 2020-08-02 DIAGNOSIS — C7931 Secondary malignant neoplasm of brain: Secondary | ICD-10-CM | POA: Insufficient documentation

## 2020-08-02 DIAGNOSIS — C3412 Malignant neoplasm of upper lobe, left bronchus or lung: Secondary | ICD-10-CM | POA: Insufficient documentation

## 2020-08-02 DIAGNOSIS — F419 Anxiety disorder, unspecified: Secondary | ICD-10-CM | POA: Insufficient documentation

## 2020-08-03 ENCOUNTER — Ambulatory Visit (HOSPITAL_COMMUNITY)
Admission: RE | Admit: 2020-08-03 | Discharge: 2020-08-03 | Disposition: A | Discharge status: Home / Self Care | Payer: 59 | Source: Ambulatory Visit | Attending: Internal Medicine | Admitting: Internal Medicine

## 2020-08-03 ENCOUNTER — Other Ambulatory Visit: Payer: Self-pay

## 2020-08-03 DIAGNOSIS — C3490 Malignant neoplasm of unspecified part of unspecified bronchus or lung: Secondary | ICD-10-CM | POA: Insufficient documentation

## 2020-08-03 MED ORDER — GADOBUTROL 1 MMOL/ML IV SOLN
7.0000 mL | Freq: Once | INTRAVENOUS | Status: AC | PRN
  Administered 2020-08-03: 7 mL via INTRAVENOUS

## 2020-08-05 ENCOUNTER — Ambulatory Visit (HOSPITAL_COMMUNITY)
Admission: RE | Admit: 2020-08-05 | Discharge: 2020-08-05 | Disposition: A | Discharge status: Home / Self Care | Payer: 59 | Source: Ambulatory Visit | Attending: Internal Medicine | Admitting: Internal Medicine

## 2020-08-05 ENCOUNTER — Encounter (HOSPITAL_COMMUNITY): Payer: Self-pay

## 2020-08-05 ENCOUNTER — Other Ambulatory Visit: Payer: 59

## 2020-08-05 ENCOUNTER — Other Ambulatory Visit: Payer: Self-pay

## 2020-08-05 DIAGNOSIS — C3490 Malignant neoplasm of unspecified part of unspecified bronchus or lung: Secondary | ICD-10-CM | POA: Diagnosis not present

## 2020-08-05 MED ORDER — HEPARIN SOD (PORK) LOCK FLUSH 100 UNIT/ML IV SOLN
INTRAVENOUS | Status: AC
  Administered 2020-08-05: 500 [IU] via INTRAVENOUS
  Filled 2020-08-05: qty 5

## 2020-08-05 MED ORDER — HEPARIN SOD (PORK) LOCK FLUSH 100 UNIT/ML IV SOLN: 500.0000 [IU] | Freq: Once | INTRAVENOUS | Status: AC

## 2020-08-05 MED ORDER — IOHEXOL 300 MG/ML  SOLN
75.0000 mL | Freq: Once | INTRAMUSCULAR | Status: AC | PRN
  Administered 2020-08-05: 75 mL via INTRAVENOUS

## 2020-08-08 ENCOUNTER — Inpatient Hospital Stay: Payer: 59

## 2020-08-08 ENCOUNTER — Telehealth: Payer: Self-pay | Admitting: Radiation Oncology

## 2020-08-08 ENCOUNTER — Inpatient Hospital Stay (HOSPITAL_BASED_OUTPATIENT_CLINIC_OR_DEPARTMENT_OTHER): Payer: 59 | Admitting: Internal Medicine

## 2020-08-08 ENCOUNTER — Encounter: Payer: Self-pay | Admitting: Internal Medicine

## 2020-08-08 ENCOUNTER — Telehealth: Payer: Self-pay | Admitting: Internal Medicine

## 2020-08-08 ENCOUNTER — Encounter: Payer: Self-pay | Admitting: Radiation Oncology

## 2020-08-08 ENCOUNTER — Other Ambulatory Visit: Payer: Self-pay

## 2020-08-08 VITALS — BP 131/80 | HR 91 | Temp 97.0°F | Resp 18 | Ht 72.0 in | Wt 157.8 lb

## 2020-08-08 DIAGNOSIS — Z79899 Other long term (current) drug therapy: Secondary | ICD-10-CM | POA: Diagnosis not present

## 2020-08-08 DIAGNOSIS — C3412 Malignant neoplasm of upper lobe, left bronchus or lung: Secondary | ICD-10-CM | POA: Diagnosis not present

## 2020-08-08 DIAGNOSIS — C7931 Secondary malignant neoplasm of brain: Secondary | ICD-10-CM | POA: Diagnosis present

## 2020-08-08 DIAGNOSIS — Z5112 Encounter for antineoplastic immunotherapy: Secondary | ICD-10-CM

## 2020-08-08 DIAGNOSIS — E785 Hyperlipidemia, unspecified: Secondary | ICD-10-CM | POA: Diagnosis not present

## 2020-08-08 DIAGNOSIS — Z95828 Presence of other vascular implants and grafts: Secondary | ICD-10-CM

## 2020-08-08 DIAGNOSIS — Z720 Tobacco use: Secondary | ICD-10-CM

## 2020-08-08 DIAGNOSIS — I10 Essential (primary) hypertension: Secondary | ICD-10-CM

## 2020-08-08 DIAGNOSIS — Z923 Personal history of irradiation: Secondary | ICD-10-CM | POA: Diagnosis not present

## 2020-08-08 DIAGNOSIS — Z791 Long term (current) use of non-steroidal anti-inflammatories (NSAID): Secondary | ICD-10-CM | POA: Diagnosis not present

## 2020-08-08 DIAGNOSIS — Z9221 Personal history of antineoplastic chemotherapy: Secondary | ICD-10-CM | POA: Diagnosis not present

## 2020-08-08 DIAGNOSIS — Z7982 Long term (current) use of aspirin: Secondary | ICD-10-CM | POA: Diagnosis not present

## 2020-08-08 DIAGNOSIS — Z8546 Personal history of malignant neoplasm of prostate: Secondary | ICD-10-CM | POA: Diagnosis not present

## 2020-08-08 DIAGNOSIS — F419 Anxiety disorder, unspecified: Secondary | ICD-10-CM | POA: Diagnosis not present

## 2020-08-08 LAB — CMP (CANCER CENTER ONLY)
ALT: 28 U/L (ref 0–44)
AST: 35 U/L (ref 15–41)
Albumin: 3.9 g/dL (ref 3.5–5.0)
Alkaline Phosphatase: 95 U/L (ref 38–126)
Anion gap: 8 (ref 5–15)
BUN: 11 mg/dL (ref 6–20)
CO2: 27 mmol/L (ref 22–32)
Calcium: 9.2 mg/dL (ref 8.9–10.3)
Chloride: 106 mmol/L (ref 98–111)
Creatinine: 0.8 mg/dL (ref 0.61–1.24)
GFR, Est AFR Am: >60 mL/min (ref >60)
GFR, Estimated: >60 mL/min (ref >60)
Glucose, Bld: 90 mg/dL (ref 70–99)
Potassium: 4.1 mmol/L (ref 3.5–5.1)
Sodium: 141 mmol/L (ref 135–145)
Total Bilirubin: 0.3 mg/dL (ref 0.3–1.2)
Total Protein: 7.3 g/dL (ref 6.5–8.1)

## 2020-08-08 LAB — CBC WITH DIFFERENTIAL (CANCER CENTER ONLY)
Abs Immature Granulocytes: 0.06 10*3/uL (ref 0.00–0.07)
Basophils Absolute: 0 10*3/uL (ref 0.0–0.1)
Basophils Relative: 1 %
Eosinophils Absolute: 0.2 10*3/uL (ref 0.0–0.5)
Eosinophils Relative: 4 %
HCT: 25.4 % — ABNORMAL LOW (ref 39.0–52.0)
Hemoglobin: 8.5 g/dL — ABNORMAL LOW (ref 13.0–17.0)
Immature Granulocytes: 1 %
Lymphocytes Relative: 14 %
Lymphs Abs: 0.9 10*3/uL (ref 0.7–4.0)
MCH: 31.8 pg (ref 26.0–34.0)
MCHC: 33.5 g/dL (ref 30.0–36.0)
MCV: 95.1 fL (ref 80.0–100.0)
Monocytes Absolute: 0.6 10*3/uL (ref 0.1–1.0)
Monocytes Relative: 10 %
Neutro Abs: 4.3 10*3/uL (ref 1.7–7.7)
Neutrophils Relative %: 70 %
Platelet Count: 263 10*3/uL (ref 150–400)
RBC: 2.67 MIL/uL — ABNORMAL LOW (ref 4.22–5.81)
RDW: 20.2 % — ABNORMAL HIGH (ref 11.5–15.5)
WBC Count: 6.1 10*3/uL (ref 4.0–10.5)
nRBC: 0 % (ref 0.0–0.2)

## 2020-08-08 LAB — TSH: TSH: 1.455 u[IU]/mL (ref 0.320–4.118)

## 2020-08-08 MED ORDER — HEPARIN SOD (PORK) LOCK FLUSH 100 UNIT/ML IV SOLN
500.0000 [IU] | Freq: Once | INTRAVENOUS | Status: AC
  Administered 2020-08-08: 500 [IU]
  Filled 2020-08-08: qty 5

## 2020-08-08 MED ORDER — SODIUM CHLORIDE 0.9% FLUSH
10.0000 mL | Freq: Once | INTRAVENOUS | Status: AC
  Administered 2020-08-08: 10 mL
  Filled 2020-08-08: qty 10

## 2020-08-08 NOTE — Telephone Encounter (Signed)
Scheduled appointments per 9/16 los. Patient is aware of appointments. Patient declined calendar print out.

## 2020-08-08 NOTE — Progress Notes (Signed)
[facility] [phone]   [phone]  OFFICE PROGRESS NOTE  [name], MD [place] [id number]  DIAGNOSIS: Extensive stage (T2b, N2, M1c) small cell lung cancer presented with left upper lobe pulmonary nodule in addition to left hilar mass with mediastinal invasion and solitary brain metastasis diagnosed in May 2021  PRIOR THERAPY: Palliative radiation to the left hilar and mediastinal lymphadenopathy under the care of Dr. Lisbeth Renshaw.  CURRENT THERAPY:  Palliative systemic chemotherapy with carboplatin for AUC of 5 on day 1, etoposide 100 mg/M2 on days 1, 2 and 3 as well as Imfinzi 1500 mg IV every 3 weeks with the chemotherapy.  First dose of chemotherapy May 06, 2020.  The patient will receive treatment with Cosela on the days of his chemotherapy.  Status post 4 cycles.  Starting from cycle #5 he will be.  He did wear his maintenance immunotherapy with Imfinzi 1500 mg IV every 4 weeks.  INTERVAL HISTORY: Richard Santos 59 y.o. male returns to the clinic today for follow-up visit accompanied by his wife.  The patient is feeling fine today with no concerning complaints except for some mild pain in the back.  He denied having any chest pain, shortness of breath, cough or hemoptysis.  He denied having any fever.  He has no nausea, vomiting, diarrhea or constipation.  He has no headache or visual changes.  He tolerated the last cycle of his treatment with chemotherapy fairly well.  The patient had repeat CT scan of the chest as well as MRI of the brain performed recently and is here for evaluation and discussion of his scan results.   MEDICAL HISTORY: Past Medical History:  Diagnosis Date  . Allergic rhinitis   . Allergy   . Anxiety   . Chronic low back pain   . DDD (degenerative disc disease), lumbar   . ED (erectile dysfunction)   . Finger injury    cut pads off 3rd and 4th finger left hand/due to lawn mower accident  . GERD  (gastroesophageal reflux disease)   . Heart murmur    as child only-   . History of kidney stones   . HTN (hypertension) 08/20/2016  . Hyperlipidemia   . Incomplete right bundle branch block   . Prostate cancer ([place]) dx 10/02/14   stage T1c  . Prostate cancer ([facility])   . Sigmoid diverticulosis   . Wears glasses     ALLERGIES:  is allergic to sulfur, bee venom, and sulfa antibiotics.  MEDICATIONS:  Current Outpatient Medications  Medication Sig Dispense Refill  . acetaminophen (TYLENOL) 325 MG tablet Take 2 tablets (650 mg total) by mouth every 6 (six) hours as needed for mild pain (or Fever >/= 101). 30 tablet 0  . aspirin EC 81 MG tablet Take 81 mg by mouth daily. Swallow whole.    Marland Kitchen atorvastatin (LIPITOR) 40 MG tablet Take 1 tablet (40 mg total) by mouth daily. 90 tablet 3  . busPIRone (BUSPAR) 10 MG tablet Take 1 tablet (10 mg total) by mouth 2 (two) times daily. 180 tablet 3  . EPINEPHrine (EPIPEN) 0.3 mg/0.3 mL IJ SOAJ injection Inject 0.3 mLs (0.3 mg total) into the muscle once. 2 Device 2  . folic acid (FOLVITE) 1 MG tablet Take 1 tablet (1 mg total) by mouth daily. (Patient not taking: Reported on 05/24/2020) 30 tablet 0  . HYDROcodone-acetaminophen (HYCET) 7.5-325 mg/15 ml solution Take 10 mLs by mouth  4 (four) times daily as needed for moderate pain. 473 mL 0  . lidocaine (XYLOCAINE) 2 % solution Use as directed 10 mLs in the mouth or throat every 6 (six) hours as needed for mouth pain. 450 mL 1  . lidocaine-prilocaine (EMLA) cream Apply 1 application topically as needed. 30 g 2  . losartan (COZAAR) 50 MG tablet Take 1 tablet (50 mg total) by mouth daily. (Patient not taking: Reported on 05/24/2020) 90 tablet 3  . meloxicam (MOBIC) 15 MG tablet TAKE 1 TABLET BY MOUTH EVERY DAY AS NEEDED FOR PAIN 30 tablet 2  . Multiple Vitamin (MULTIVITAMIN) capsule Take 1 capsule by mouth daily.    . nicotine (NICODERM CQ - DOSED IN MG/24 HOURS) 21 mg/24hr patch Place 1 patch (21 mg total) onto the  skin daily. 28 patch 0  . nicotine (NICODERM CQ) 14 mg/24hr patch Place 1 patch (14 mg total) onto the skin daily. 28 patch 3  . nicotine polacrilex (COMMIT) 4 MG lozenge Take 1 lozenge (4 mg total) by mouth as needed for smoking cessation. 108 tablet 6  . pantoprazole (PROTONIX) 40 MG tablet Take 1 tablet (40 mg total) by mouth daily before breakfast. 90 tablet 3  . phenazopyridine (PYRIDIUM) 200 MG tablet Take 1 tablet (200 mg total) by mouth 3 (three) times daily as needed for pain. 12 tablet 0  . prochlorperazine (COMPAZINE) 10 MG tablet Take 1 tablet (10 mg total) by mouth every 6 (six) hours as needed for nausea or vomiting. 30 tablet 0  . sucralfate (CARAFATE) 1 g tablet PLEASE SEE ATTACHED FOR DETAILED DIRECTIONS 120 tablet 0  . traMADol (ULTRAM) 50 MG tablet TAKE HALF TABLET BY MOUTH 2 TIMES DAILY AS NEEDED FOR PAIN (Patient taking differently: Take 25 mg by mouth every 12 (twelve) hours as needed for moderate pain. TAKE HALF TABLET BY MOUTH 2 TIMES DAILY AS NEEDED FOR PAIN) 60 tablet 2  . triamcinolone cream (KENALOG) 0.5 % Apply 1 application topically 3 (three) times daily. 90 g 1   No current facility-administered medications for this visit.    SURGICAL HISTORY:  Past Surgical History:  Procedure Laterality Date  . BRONCHIAL BIOPSY  04/12/2020   Procedure: BRONCHIAL BIOPSIES;  Surgeon: Marshell Garfinkel, MD;  Location: [facility];  Service: Cardiopulmonary;;  . BRONCHIAL BRUSHINGS  04/12/2020   Procedure: BRONCHIAL BRUSHINGS;  Surgeon: Marshell Garfinkel, MD;  Location: [facility];  Service: Cardiopulmonary;;  . BRONCHIAL NEEDLE ASPIRATION BIOPSY  04/12/2020   Procedure: BRONCHIAL NEEDLE ASPIRATION BIOPSIES;  Surgeon: Marshell Garfinkel, MD;  Location: [facility];  Service: Cardiopulmonary;;  . BRONCHIAL WASHINGS  04/12/2020   Procedure: BRONCHIAL WASHINGS;  Surgeon: Marshell Garfinkel, MD;  Location: [facility];  Service: Cardiopulmonary;;  . COLONOSCOPY    . COLONOSCOPY W/  POLYPECTOMY  04-19-2012  . ENDOBRONCHIAL ULTRASOUND N/A 04/12/2020   Procedure: ENDOBRONCHIAL ULTRASOUND;  Surgeon: Marshell Garfinkel, MD;  Location: [facility];  Service: Cardiopulmonary;  Laterality: N/A;  . ESOPHAGOGASTRODUODENOSCOPY  08-05-2010  . HEMOSTASIS CONTROL  04/12/2020   Procedure: HEMOSTASIS CONTROL;  Surgeon: Marshell Garfinkel, MD;  Location: [facility];  Service: Cardiopulmonary;;  . IR IMAGING GUIDED PORT INSERTION  05/31/2020  . POLYPECTOMY    . PROSTATE BIOPSY  10/02/14  . RADIOACTIVE SEED IMPLANT N/A 01/30/2015   Procedure: RADIOACTIVE SEED IMPLANT;  Surgeon: Rana Snare, MD;  Location: [facility];  Service: Urology;  Laterality: N/A;  . UPPER GASTROINTESTINAL ENDOSCOPY    . VIDEO BRONCHOSCOPY N/A 04/12/2020   Procedure: VIDEO BRONCHOSCOPY  WITHOUT FLUORO;  Surgeon: Marshell Garfinkel, MD;  Location: [facility];  Service: Cardiopulmonary;  Laterality: N/A;    REVIEW OF SYSTEMS:  Constitutional: positive for fatigue Eyes: negative Ears, nose, mouth, throat, and face: negative Respiratory: negative Cardiovascular: negative Gastrointestinal: negative Genitourinary:negative Integument/breast: negative Hematologic/lymphatic: negative Musculoskeletal:positive for back pain Neurological: negative Behavioral/Psych: negative Endocrine: negative Allergic/Immunologic: negative   PHYSICAL EXAMINATION: General appearance: alert, cooperative, fatigued and no distress Head: Normocephalic, without obvious abnormality, atraumatic Neck: no adenopathy, no JVD, supple, symmetrical, trachea midline and thyroid not enlarged, symmetric, no tenderness/mass/nodules Lymph nodes: Cervical, supraclavicular, and axillary nodes normal. Resp: clear to auscultation bilaterally Back: symmetric, no curvature. ROM normal. No CVA tenderness. Cardio: regular rate and rhythm, S1, S2 normal, no murmur, click, rub or gallop GI: soft, non-tender; bowel sounds normal; no masses,  no  organomegaly Extremities: extremities normal, atraumatic, no cyanosis or edema Neurologic: Alert and oriented X 3, normal strength and tone. Normal symmetric reflexes. Normal coordination and gait  ECOG PERFORMANCE STATUS: 1 - Symptomatic but completely ambulatory  Blood pressure 131/80, pulse 91, temperature (!) 97 F (36.1 C), temperature source Tympanic, resp. rate 18, height 6' (1.829 m), weight 157 lb 12.8 oz (71.6 kg), SpO2 100 %.  LABORATORY DATA: Lab Results  Component Value Date   WBC 6.1 08/08/2020   HGB 8.5 (L) 08/08/2020   HCT 25.4 (L) 08/08/2020   MCV 95.1 08/08/2020   PLT 263 08/08/2020      Chemistry      Component Value Date/Time   NA 136 07/22/2020 1030   K 4.3 07/22/2020 1030   CL 100 07/22/2020 1030   CO2 29 07/22/2020 1030   BUN 18 07/22/2020 1030   CREATININE 0.84 07/22/2020 1030      Component Value Date/Time   CALCIUM 10.4 (H) 07/22/2020 1030   ALKPHOS 125 07/22/2020 1030   AST 21 07/22/2020 1030   ALT 19 07/22/2020 1030   BILITOT 0.7 07/22/2020 1030       RADIOGRAPHIC STUDIES: CT Chest W Contrast  Result Date: 08/05/2020 CLINICAL DATA:  Small cell lung cancer staging EXAM: CT CHEST WITH CONTRAST TECHNIQUE: Multidetector CT imaging of the chest was performed during intravenous contrast administration. CONTRAST:  25mL OMNIPAQUE IOHEXOL 300 MG/ML  SOLN COMPARISON:  06/27/2020 FINDINGS: Cardiovascular: Right chest port catheter. Scattered aortic atherosclerosis. Normal heart size. Three-vessel coronary artery calcifications. No pericardial effusion. Mediastinum/Nodes: No enlarged mediastinal, hilar, or axillary lymph nodes. Unchanged post treatment appearance of soft tissue about the left hilum and in the adjacent posterior AP window (series 2, image 72, 80). Thyroid gland, trachea, and esophagus demonstrate no significant findings. Lungs/Pleura: Paraseptal emphysema. No significant change in a lobulated nodule of the peripheral left upper lobe measuring  2.8 x 1.6 cm (series 5, image 42). Biapical pleuroparenchymal scarring. No pleural effusion or pneumothorax. Upper Abdomen: No acute abnormality. Musculoskeletal: No chest wall mass or suspicious bone lesions identified. IMPRESSION: 1. No significant change in a lobulated nodule of the peripheral left upper lobe, consistent with primary lung malignancy. 2. Unchanged post treatment appearance of soft tissue about the left hilum and in the adjacent posterior AP window. 3. No evidence of malignant recurrence in the chest. 4. Emphysema (ICD10-J43.9). 5. Coronary artery disease.  Aortic Atherosclerosis (ICD10-I70.0). Electronically Signed   By: Eddie Candle M.D.   On: 08/05/2020 08:43   MR Brain W Wo Contrast  Result Date: 08/03/2020 CLINICAL DATA:  Malignant neoplasm of unspecified part of unspecified bronchus or lung. Small cell lung cancer, staging. EXAM:  MRI HEAD WITHOUT AND WITH CONTRAST TECHNIQUE: Multiplanar, multiecho pulse sequences of the brain and surrounding structures were obtained without and with intravenous contrast. CONTRAST:  87mL GADAVIST GADOBUTROL 1 MMOL/ML IV SOLN COMPARISON:  Brain MRI 04/11/2020. FINDINGS: Brain: Stable, moderate generalized parenchymal atrophy. There is SWI signal loss consistent with chronic blood products at site of a known subcentimeter left cerebellar metastasis. However, there is no longer appreciable enhancement at this site. New from the prior MRI, there is a 4 mm focus of mild restricted diffusion within the right cerebellar hemisphere (series 5, image 68) (series 13, image 50). There is no appreciable corresponding enhancement at this site. No new site of abnormal intracranial enhancement is identified. Stable, mild multifocal T2/FLAIR hyperintensity within the cerebral white matter which is nonspecific, but consistent with chronic small vessel ischemic disease. Redemonstrated chronic lacunar infarct within the left corona radiata/basal ganglia. No evidence of  intracranial mass. No chronic intracranial blood products. No extra-axial fluid collection. No midline shift. Vascular: Expected proximal arterial flow voids. Small right frontal lobe developmental venous anomaly. Skull and upper cervical spine: No focal marrow lesion. Sinuses/Orbits: Visualized orbits show no acute finding. Moderate ethmoid sinus mucosal thickening. Mild frontal, sphenoid and maxillary sinus mucosal thickening. Small left maxillary sinus mucous retention cyst. Right mastoid effusion. IMPRESSION: Interval resolution of enhancement associated with a left cerebellar metastasis. Chronic blood products remain at this site. 4 mm focus of restricted diffusion within the right cerebellar hemisphere without appreciable enhancement. This likely reflects a small subacute infarct. However, consider short interval MRI follow-up to exclude a metastatic lesion at this site. Stable mild generalized parenchymal atrophy and chronic small vessel ischemic disease. Redemonstrated chronic left corona radiata/basal ganglia lacunar infarct. Paranasal sinus disease as described. Right mastoid effusion. Electronically Signed   By: Kellie Simmering DO   On: 08/03/2020 19:59    ASSESSMENT AND PLAN: This is a very pleasant 59 years old white male recently diagnosed with extensive stage small cell lung cancer presented with left upper lobe lung mass in addition to left hilar and mediastinal lymphadenopathy as well as solitary brain metastasis diagnosed in May 2021. He underwent palliative radiotherapy to the left hilar and mediastinal lymphadenopathy in addition to the radiotherapy to the brain metastasis.  He continues to have residual odynophagia and dysphagia from the palliative radiotherapy. He is currently undergoing systemic chemotherapy with carboplatin for AUC of 5 on day 1, etoposide 100 mg/M2 on days 1, 2 and 3 with Cosela infusion before chemotherapy.  He is status post 4 cycles of treatment  The patient tolerated  his chemotherapy fairly well with no concerning adverse effects. He had repeat CT scan of the chest as well as MRI of the brain performed recently.  I personally and independently reviewed the scans and discussed the results with the patient today. MRI of the brain showed improvement of the previously identified brain lesion. CT scan of the chest showed no evidence for disease progression. I recommended for the patient to start the maintenance phase of his treatment with Imfinzi 1500 mg IV every 4 weeks.  He will have the first cycle of this treatment today. He will come back for follow-up visit in 4 weeks for evaluation before starting cycle #6. The patient will see Dr. Lisbeth Renshaw for evaluation and discussion of the brain radiation. He was advised to call immediately if he has any concerning symptoms in the interval. The patient voices understanding of current disease status and treatment options and is in agreement with the  current care plan.  All questions were answered. The patient knows to call the clinic with any problems, questions or concerns. We can certainly see the patient much sooner if necessary.   Disclaimer: This note was dictated with voice recognition software. Similar sounding words can inadvertently be transcribed and may not be corrected upon review.

## 2020-08-08 NOTE — Telephone Encounter (Signed)
I called and spoke with the patient to review his MRI of the brain on 08/03/20 appeared to show improvement of his previously noted 4 mm lesion in the right cerebellum. Dr. Lisbeth Renshaw has recommended 25 Gy in 10 fractions for PCI treatment to the whole brain since he had resolution of the area in the cerebellum. We also discussed Namenda to reduce changes in memory from whole brain radiation. He is interested in proceeding and will come on Tuesday for simulation. He wants to think about his options for the medication but will let me know on Tuesday. They would like to wait to start XRT until after a beach trip and they could start on 08/22/20. This would work well so he finishes before immunotherapy begins on 09/05/20.    Carola Rhine, PAC

## 2020-08-13 ENCOUNTER — Other Ambulatory Visit: Payer: Self-pay

## 2020-08-13 ENCOUNTER — Ambulatory Visit
Admission: RE | Admit: 2020-08-13 | Discharge: 2020-08-13 | Disposition: A | Discharge status: Home / Self Care | Payer: 59 | Source: Ambulatory Visit | Attending: Radiation Oncology | Admitting: Radiation Oncology

## 2020-08-13 ENCOUNTER — Encounter: Payer: Self-pay | Admitting: Radiation Oncology

## 2020-08-13 DIAGNOSIS — C7931 Secondary malignant neoplasm of brain: Secondary | ICD-10-CM | POA: Insufficient documentation

## 2020-08-13 DIAGNOSIS — Z510 Encounter for antineoplastic radiation therapy: Secondary | ICD-10-CM | POA: Insufficient documentation

## 2020-08-13 DIAGNOSIS — C3412 Malignant neoplasm of upper lobe, left bronchus or lung: Secondary | ICD-10-CM | POA: Insufficient documentation

## 2020-08-13 MED ORDER — MEMANTINE HCL 5 MG PO TABS: ORAL_TABLET | ORAL | 5 refills | Status: DC

## 2020-08-13 MED ORDER — MEMANTINE HCL 5 MG PO TABS: ORAL_TABLET | ORAL | 0 refills | Status: DC

## 2020-08-13 NOTE — Progress Notes (Signed)
I consented pt for PCI to the whole brain and we discussed namenda to reduce risks of cognitive change during and following treatment. A new rx was sent to his pharmacy after reviewing the side effect profile.

## 2020-08-14 ENCOUNTER — Telehealth: Payer: Self-pay | Admitting: Medical Oncology

## 2020-08-14 NOTE — Telephone Encounter (Signed)
appts cancelled for lab this week.

## 2020-08-15 ENCOUNTER — Ambulatory Visit (INDEPENDENT_AMBULATORY_CARE_PROVIDER_SITE_OTHER): Payer: 59 | Admitting: Pulmonary Disease

## 2020-08-15 ENCOUNTER — Other Ambulatory Visit: Payer: Self-pay

## 2020-08-15 ENCOUNTER — Inpatient Hospital Stay: Payer: 59

## 2020-08-15 DIAGNOSIS — Z720 Tobacco use: Secondary | ICD-10-CM

## 2020-08-15 DIAGNOSIS — C3412 Malignant neoplasm of upper lobe, left bronchus or lung: Secondary | ICD-10-CM | POA: Diagnosis not present

## 2020-08-15 LAB — PULMONARY FUNCTION TEST
DL/VA % pred: 95 %
DL/VA: 4.08 ml/min/mmHg/L
DLCO cor % pred: 81 %
DLCO cor: 23.6 ml/min/mmHg
DLCO unc % pred: 63 %
DLCO unc: 18.22 ml/min/mmHg
FEF 25-75 Post: 1.83 L/sec
FEF 25-75 Pre: 1.38 L/sec
FEF2575-%Change-Post: 32 %
FEF2575-%Pred-Post: 57 %
FEF2575-%Pred-Pre: 43 %
FEV1-%Change-Post: 9 %
FEV1-%Pred-Post: 72 %
FEV1-%Pred-Pre: 66 %
FEV1-Post: 2.74 L
FEV1-Pre: 2.51 L
FEV1FVC-%Change-Post: 15 %
FEV1FVC-%Pred-Pre: 81 %
FEV6-%Change-Post: -6 %
FEV6-%Pred-Post: 79 %
FEV6-%Pred-Pre: 84 %
FEV6-Post: 3.82 L
FEV6-Pre: 4.07 L
FEV6FVC-%Pred-Post: 104 %
FEV6FVC-%Pred-Pre: 104 %
FVC-%Change-Post: -5 %
FVC-%Pred-Post: 76 %
FVC-%Pred-Pre: 81 %
FVC-Post: 3.83 L
FVC-Pre: 4.07 L
Post FEV1/FVC ratio: 72 %
Post FEV6/FVC ratio: 100 %
Pre FEV1/FVC ratio: 62 %
Pre FEV6/FVC Ratio: 100 %
RV % pred: 109 %
RV: 2.46 L
TLC % pred: 83 %
TLC: 6.04 L

## 2020-08-15 NOTE — Progress Notes (Signed)
Full PFT performed today. °

## 2020-08-16 ENCOUNTER — Encounter: Payer: Self-pay | Admitting: Pulmonary Disease

## 2020-08-16 ENCOUNTER — Ambulatory Visit (INDEPENDENT_AMBULATORY_CARE_PROVIDER_SITE_OTHER): Payer: 59 | Admitting: Pulmonary Disease

## 2020-08-16 VITALS — BP 122/74 | HR 100 | Temp 98.6°F | Ht 72.0 in | Wt 161.2 lb

## 2020-08-16 DIAGNOSIS — C3412 Malignant neoplasm of upper lobe, left bronchus or lung: Secondary | ICD-10-CM

## 2020-08-16 DIAGNOSIS — F17210 Nicotine dependence, cigarettes, uncomplicated: Secondary | ICD-10-CM | POA: Diagnosis not present

## 2020-08-16 DIAGNOSIS — J449 Chronic obstructive pulmonary disease, unspecified: Secondary | ICD-10-CM

## 2020-08-16 MED ORDER — BREZTRI AEROSPHERE 160-9-4.8 MCG/ACT IN AERO: 2.0000 | INHALATION_SPRAY | Freq: Two times a day (BID) | RESPIRATORY_TRACT | 0 refills | Status: DC

## 2020-08-16 NOTE — Patient Instructions (Signed)
We will do a sample of inhaler.  If it makes a significant difference in her breathing call us and let us know and we will call in a prescription Continue to work on smoking cessation Follow-up in 6 months.

## 2020-08-16 NOTE — Progress Notes (Signed)
Richard Santos    [id number]    08/31/1961  Primary Care Physician:John, Hunt Oris, MD  Referring Physician: [name], MD [place],  [place] [address]  Chief complaint: Consult for lung cancer  HPI: 59 year old smoker initially evaluated for lung mass status post bronchoscopy on 04/12/2020 with diagnosis of extensive stage small cell cancer. Currently undergoing chemoradiation and is due to get radiation for brain mets Continues to smoke.  Has chronic dyspnea on exertion, chronic cough with white mucus  Pets: No pets Occupation: Adult nurse.  Currently on disability Exposures: Does have chemical exposures within his job but reports good ventilation Smoking history: 30-pack-year smoker, continues to smoke Travel history: No significant travel history Relevant family history: No significant family history of lung disease   Outpatient Encounter Medications as of 08/16/2020  Medication Sig   acetaminophen (TYLENOL) 325 MG tablet Take 2 tablets (650 mg total) by mouth every 6 (six) hours as needed for mild pain (or Fever >/= 101).   aspirin EC 81 MG tablet Take 81 mg by mouth daily. Swallow whole.   atorvastatin (LIPITOR) 40 MG tablet Take 1 tablet (40 mg total) by mouth daily.   busPIRone (BUSPAR) 10 MG tablet Take 1 tablet (10 mg total) by mouth 2 (two) times daily.   EPINEPHrine (EPIPEN) 0.3 mg/0.3 mL IJ SOAJ injection Inject 0.3 mLs (0.3 mg total) into the muscle once.   lidocaine-prilocaine (EMLA) cream Apply 1 application topically as needed.   meloxicam (MOBIC) 15 MG tablet TAKE 1 TABLET BY MOUTH EVERY DAY AS NEEDED FOR PAIN   Multiple Vitamin (MULTIVITAMIN) capsule Take 1 capsule by mouth daily.   nicotine (NICODERM CQ - DOSED IN MG/24 HOURS) 21 mg/24hr patch Place 1 patch (21 mg total) onto the skin daily.   nicotine (NICODERM CQ) 14 mg/24hr patch Place 1 patch (14 mg total) onto the skin daily.   pantoprazole (PROTONIX) 40 MG  tablet Take 1 tablet (40 mg total) by mouth daily before breakfast.   prochlorperazine (COMPAZINE) 10 MG tablet Take 1 tablet (10 mg total) by mouth every 6 (six) hours as needed for nausea or vomiting.   memantine (NAMENDA) 5 MG tablet After titrating over 4 weeks: Take 2 tablets po q am and qpm for next 20 weeks. (Patient not taking: Reported on 08/16/2020)   memantine (NAMENDA) 5 MG tablet Begin this prescription the first day of brain radiation. Week 1: take one tablet po qam. Week 2: take one tablet qam and qpm. Week 3: take two tablets qam, and one tablet po q pm. Week 4: take two tablets qam and qpm. Fill subsequent prescription q month. (Patient not taking: Reported on 08/16/2020)   traMADol (ULTRAM) 50 MG tablet TAKE HALF TABLET BY MOUTH 2 TIMES DAILY AS NEEDED FOR PAIN (Patient taking differently: Take 25 mg by mouth every 12 (twelve) hours as needed for moderate pain. TAKE HALF TABLET BY MOUTH 2 TIMES DAILY AS NEEDED FOR PAIN)   triamcinolone cream (KENALOG) 0.5 % Apply 1 application topically 3 (three) times daily.   No facility-administered encounter medications on file as of 08/16/2020.   Physical Exam: Blood pressure 122/74, pulse 100, temperature 98.6 F (37 C), temperature source Oral, height 6' (1.829 m), weight 161 lb 3.2 oz (73.1 kg), SpO2 96 %. Gen:      No acute distress HEENT:  EOMI, sclera anicteric Neck:     No masses; no thyromegaly Lungs:  Diminished breath sounds CV:         Regular rate and rhythm; no murmurs Abd:      + bowel sounds; soft, non-tender; no palpable masses, no distension Ext:    No edema; adequate peripheral perfusion Skin:      Warm and dry; no rash Neuro: alert and oriented x 3 Psych: normal mood and affect  Data Reviewed: Imaging: CTA chest 04/11/20 - Negative for acute PE but 4.7 x 4.5 x 4.8cm left hilar mass was seen that invades the mediastinum and left hilum this mass also involves the left upper lobe pulmonary arteries with significant  luminal narrowing seen.  CT chest 06/27/2020-left upper lobe nodule, decrease in size of left hilar mass  CT chest 08/05/2020-stable left upper lobe nodule and left hilar opacity.  Emphysema. I have reviewed the images personally.  PFTs: 08/15/2020 FVC 3.83 [76%], FEV1 2.74 [72%], F/F 72, TLC 6.04 [83%], DLCO 23.60 [81%] Moderate obstruction  Labs: CBC 08/08/2020-WBC 6.1, eos 4%, absolute eosinophil count 244  Assessment:  Moderate COPD PFTs reviewed. Discussed inhaler use.  He is currently on disability and may be unable to afford them We will give him a sample of Breztri.  If he notes a significant difference and is okay with the cost then he will call in for a prescription  Active smoker Smoking cessation encouraged.  Time spent counseling-5 minutes.  Reassess at return visit  Small cell lung cancer On chemoradiation.  Plan/Recommendations: Breztri inhaler Smoking cessation  Marshell Garfinkel MD [facility] 08/16/2020, 3:42 PM  CC: [name], MD

## 2020-08-20 DIAGNOSIS — Z510 Encounter for antineoplastic radiation therapy: Secondary | ICD-10-CM | POA: Diagnosis not present

## 2020-08-22 ENCOUNTER — Other Ambulatory Visit: Payer: 59

## 2020-08-22 ENCOUNTER — Ambulatory Visit: Payer: 59 | Admitting: Radiation Oncology

## 2020-08-23 ENCOUNTER — Inpatient Hospital Stay: Payer: 59

## 2020-08-23 ENCOUNTER — Ambulatory Visit: Payer: 59

## 2020-08-26 ENCOUNTER — Ambulatory Visit
Admission: RE | Admit: 2020-08-26 | Discharge: 2020-08-26 | Disposition: A | Discharge status: Home / Self Care | Payer: 59 | Source: Ambulatory Visit | Attending: Radiation Oncology | Admitting: Radiation Oncology

## 2020-08-26 ENCOUNTER — Other Ambulatory Visit: Payer: Self-pay

## 2020-08-26 ENCOUNTER — Ambulatory Visit: Payer: 59

## 2020-08-26 DIAGNOSIS — F17210 Nicotine dependence, cigarettes, uncomplicated: Secondary | ICD-10-CM | POA: Diagnosis not present

## 2020-08-26 DIAGNOSIS — E785 Hyperlipidemia, unspecified: Secondary | ICD-10-CM | POA: Diagnosis not present

## 2020-08-26 DIAGNOSIS — Z5112 Encounter for antineoplastic immunotherapy: Secondary | ICD-10-CM | POA: Diagnosis not present

## 2020-08-26 DIAGNOSIS — Z8546 Personal history of malignant neoplasm of prostate: Secondary | ICD-10-CM | POA: Diagnosis not present

## 2020-08-26 DIAGNOSIS — Z923 Personal history of irradiation: Secondary | ICD-10-CM | POA: Diagnosis not present

## 2020-08-26 DIAGNOSIS — C7931 Secondary malignant neoplasm of brain: Secondary | ICD-10-CM | POA: Diagnosis present

## 2020-08-26 DIAGNOSIS — Z510 Encounter for antineoplastic radiation therapy: Secondary | ICD-10-CM | POA: Insufficient documentation

## 2020-08-26 DIAGNOSIS — C3412 Malignant neoplasm of upper lobe, left bronchus or lung: Secondary | ICD-10-CM | POA: Insufficient documentation

## 2020-08-26 DIAGNOSIS — N529 Male erectile dysfunction, unspecified: Secondary | ICD-10-CM | POA: Diagnosis not present

## 2020-08-26 DIAGNOSIS — Z7982 Long term (current) use of aspirin: Secondary | ICD-10-CM | POA: Diagnosis not present

## 2020-08-26 DIAGNOSIS — K219 Gastro-esophageal reflux disease without esophagitis: Secondary | ICD-10-CM | POA: Diagnosis not present

## 2020-08-26 DIAGNOSIS — I10 Essential (primary) hypertension: Secondary | ICD-10-CM | POA: Diagnosis not present

## 2020-08-26 DIAGNOSIS — Z79899 Other long term (current) drug therapy: Secondary | ICD-10-CM | POA: Diagnosis not present

## 2020-08-27 ENCOUNTER — Ambulatory Visit
Admission: RE | Admit: 2020-08-27 | Discharge: 2020-08-27 | Disposition: A | Discharge status: Home / Self Care | Payer: 59 | Source: Ambulatory Visit | Attending: Radiation Oncology | Admitting: Radiation Oncology

## 2020-08-27 ENCOUNTER — Other Ambulatory Visit: Payer: Self-pay

## 2020-08-27 DIAGNOSIS — Z5112 Encounter for antineoplastic immunotherapy: Secondary | ICD-10-CM | POA: Diagnosis not present

## 2020-08-28 ENCOUNTER — Ambulatory Visit
Admission: RE | Admit: 2020-08-28 | Discharge: 2020-08-28 | Disposition: A | Discharge status: Home / Self Care | Payer: 59 | Source: Ambulatory Visit | Attending: Radiation Oncology | Admitting: Radiation Oncology

## 2020-08-28 DIAGNOSIS — Z5112 Encounter for antineoplastic immunotherapy: Secondary | ICD-10-CM | POA: Diagnosis not present

## 2020-08-29 ENCOUNTER — Ambulatory Visit
Admission: RE | Admit: 2020-08-29 | Discharge: 2020-08-29 | Disposition: A | Discharge status: Home / Self Care | Payer: 59 | Source: Ambulatory Visit | Attending: Radiation Oncology | Admitting: Radiation Oncology

## 2020-08-29 DIAGNOSIS — Z5112 Encounter for antineoplastic immunotherapy: Secondary | ICD-10-CM | POA: Diagnosis not present

## 2020-08-30 ENCOUNTER — Ambulatory Visit
Admission: RE | Admit: 2020-08-30 | Discharge: 2020-08-30 | Disposition: A | Discharge status: Home / Self Care | Payer: 59 | Source: Ambulatory Visit | Attending: Radiation Oncology | Admitting: Radiation Oncology

## 2020-08-30 DIAGNOSIS — Z5112 Encounter for antineoplastic immunotherapy: Secondary | ICD-10-CM | POA: Diagnosis not present

## 2020-09-01 ENCOUNTER — Other Ambulatory Visit: Payer: Self-pay

## 2020-09-01 ENCOUNTER — Other Ambulatory Visit: Payer: Self-pay | Admitting: Radiation Oncology

## 2020-09-02 ENCOUNTER — Ambulatory Visit
Admission: RE | Admit: 2020-09-02 | Discharge: 2020-09-02 | Disposition: A | Discharge status: Home / Self Care | Payer: 59 | Source: Ambulatory Visit | Attending: Radiation Oncology | Admitting: Radiation Oncology

## 2020-09-02 DIAGNOSIS — Z5112 Encounter for antineoplastic immunotherapy: Secondary | ICD-10-CM | POA: Diagnosis not present

## 2020-09-02 MED ORDER — BREZTRI AEROSPHERE 160-9-4.8 MCG/ACT IN AERO: 2.0000 | INHALATION_SPRAY | Freq: Two times a day (BID) | RESPIRATORY_TRACT | 2 refills | Status: DC

## 2020-09-03 ENCOUNTER — Encounter: Payer: Self-pay | Admitting: Internal Medicine

## 2020-09-03 ENCOUNTER — Ambulatory Visit
Admission: RE | Admit: 2020-09-03 | Discharge: 2020-09-03 | Disposition: A | Discharge status: Home / Self Care | Payer: 59 | Source: Ambulatory Visit | Attending: Radiation Oncology | Admitting: Radiation Oncology

## 2020-09-03 DIAGNOSIS — Z5112 Encounter for antineoplastic immunotherapy: Secondary | ICD-10-CM | POA: Diagnosis not present

## 2020-09-03 NOTE — Progress Notes (Signed)
[facility] OFFICE PROGRESS NOTE  [name], MD [place] [address]  DIAGNOSIS:  Extensive stage (T2b, N2, M1c)small cell lung cancer presented with left upper lobe pulmonary nodule in addition to left hilar mass with mediastinal invasion and solitary brain metastasis diagnosed in May 2021  PRIOR THERAPY: Palliative radiation to the left hilar and mediastinal lymphadenopathy under the care of Dr. Lisbeth Renshaw.  CURRENT THERAPY:  1) Palliative systemic chemotherapy with carboplatin for AUC of 5 on day 1, etoposide 100 mg/M2 on days 1, 2 and 3 as well as Imfinzi 1500 mg IV every 3 weeks with the chemotherapy.  First dose of chemotherapy May 06, 2020.  The patient will receive treatment with Cosela on the days of his chemotherapy.  Status post 4 cycles.  Starting from cycle #5 he will be on maintanance immunotherapy with Imfinzi. 2) Radiation to the metastatic disease to the brain under the care of Dr. Lisbeth Renshaw. Last treatment scheduled for 09/06/20.  INTERVAL HISTORY: Richard Santos 59 y.o. male returns to the clinic today for a follow-up visit.  The patient is feeling well today without any concerning complaints. The patient is currently planning on undergoing brain radiation under the care of Dr. Lisbeth Renshaw. His last treatment is scheduled for tomorrow on 09/06/20. The patient has been tolerating his treatment well and he is starting maintenance immunotherapy with Imfinzi today. The patient denies any fever, chills, night sweats, or weight loss.  He denies any chest pain or hemoptysis.  He denies any cough but reports some dyspnea on exertion.  He was recently diagnosed with COPD after having lung function test performed.  He was given prescriptions for inhalers by his pulmonologist.  He denies any diarrhea or constipation.  He had one episode of nausea and vomiting a few days ago.  He states his antiemetic helps.  He denies any headache or visual changes.  The patient is  currently trying to quit smoking and he smokes approximately half a pack of cigarettes per day.  The patient is here today for evaluation before starting cycle #5  MEDICAL HISTORY: Past Medical History:  Diagnosis Date  . Allergic rhinitis   . Allergy   . Anxiety   . Chronic low back pain   . DDD (degenerative disc disease), lumbar   . ED (erectile dysfunction)   . Finger injury    cut pads off 3rd and 4th finger left hand/due to lawn mower accident  . GERD (gastroesophageal reflux disease)   . Heart murmur    as child only-   . History of kidney stones   . HTN (hypertension) 08/20/2016  . Hyperlipidemia   . Incomplete right bundle branch block   . Prostate cancer ([facility]) dx 10/02/14   stage T1c  . Prostate cancer ([facility])   . Sigmoid diverticulosis   . Wears glasses     ALLERGIES:  is allergic to sulfur, bee venom, and sulfa antibiotics.  MEDICATIONS:  Current Outpatient Medications  Medication Sig Dispense Refill  . acetaminophen (TYLENOL) 325 MG tablet Take 2 tablets (650 mg total) by mouth every 6 (six) hours as needed for mild pain (or Fever >/= 101). 30 tablet 0  . aspirin EC 81 MG tablet Take 81 mg by mouth daily. Swallow whole.    Marland Kitchen atorvastatin (LIPITOR) 40 MG tablet Take 1 tablet (40 mg total) by mouth daily. 90 tablet 3  . Budeson-Glycopyrrol-Formoterol (BREZTRI AEROSPHERE) 160-9-4.8 MCG/ACT AERO Inhale 2 puffs into the lungs in  the morning and at bedtime. 10.7 g 2  . busPIRone (BUSPAR) 10 MG tablet Take 1 tablet (10 mg total) by mouth 2 (two) times daily. 180 tablet 3  . lidocaine-prilocaine (EMLA) cream Apply 1 application topically as needed. 30 g 2  . memantine (NAMENDA) 5 MG tablet Begin this prescription the first day of brain radiation. Week 1: take one tablet po qam. Week 2: take one tablet qam and qpm. Week 3: take two tablets qam, and one tablet po q pm. Week 4: take two tablets qam and qpm. Fill subsequent prescription q month. 70 tablet 0  . Multiple Vitamin  (MULTIVITAMIN) capsule Take 1 capsule by mouth daily.    . pantoprazole (PROTONIX) 40 MG tablet Take 1 tablet (40 mg total) by mouth daily before breakfast. 90 tablet 3  . prochlorperazine (COMPAZINE) 10 MG tablet Take 1 tablet (10 mg total) by mouth every 6 (six) hours as needed for nausea or vomiting. 30 tablet 0  . traMADol (ULTRAM) 50 MG tablet TAKE HALF TABLET BY MOUTH 2 TIMES DAILY AS NEEDED FOR PAIN (Patient taking differently: Take 25 mg by mouth every 12 (twelve) hours as needed for moderate pain. TAKE HALF TABLET BY MOUTH 2 TIMES DAILY AS NEEDED FOR PAIN) 60 tablet 2  . triamcinolone cream (KENALOG) 0.5 % Apply 1 application topically 3 (three) times daily. 90 g 1  . EPINEPHrine (EPIPEN) 0.3 mg/0.3 mL IJ SOAJ injection Inject 0.3 mLs (0.3 mg total) into the muscle once. (Patient not taking: Reported on 09/05/2020) 2 Device 2  . memantine (NAMENDA) 5 MG tablet After titrating over 4 weeks: Take 2 tablets po q am and qpm for next 20 weeks. (Patient not taking: Reported on 08/16/2020) 112 tablet 5  . nicotine (NICODERM CQ - DOSED IN MG/24 HOURS) 21 mg/24hr patch Place 1 patch (21 mg total) onto the skin daily. (Patient not taking: Reported on 09/05/2020) 28 patch 0  . nicotine (NICODERM CQ) 14 mg/24hr patch Place 1 patch (14 mg total) onto the skin daily. (Patient not taking: Reported on 09/05/2020) 28 patch 3   No current facility-administered medications for this visit.    SURGICAL HISTORY:  Past Surgical History:  Procedure Laterality Date  . BRONCHIAL BIOPSY  04/12/2020   Procedure: BRONCHIAL BIOPSIES;  Surgeon: Marshell Garfinkel, MD;  Location: [facility];  Service: Cardiopulmonary;;  . BRONCHIAL BRUSHINGS  04/12/2020   Procedure: BRONCHIAL BRUSHINGS;  Surgeon: Marshell Garfinkel, MD;  Location: [facility];  Service: Cardiopulmonary;;  . BRONCHIAL NEEDLE ASPIRATION BIOPSY  04/12/2020   Procedure: BRONCHIAL NEEDLE ASPIRATION BIOPSIES;  Surgeon: Marshell Garfinkel, MD;  Location: [facility];   Service: Cardiopulmonary;;  . BRONCHIAL WASHINGS  04/12/2020   Procedure: BRONCHIAL WASHINGS;  Surgeon: Marshell Garfinkel, MD;  Location: [facility];  Service: Cardiopulmonary;;  . COLONOSCOPY    . COLONOSCOPY W/ POLYPECTOMY  04-19-2012  . ENDOBRONCHIAL ULTRASOUND N/A 04/12/2020   Procedure: ENDOBRONCHIAL ULTRASOUND;  Surgeon: Marshell Garfinkel, MD;  Location: [facility];  Service: Cardiopulmonary;  Laterality: N/A;  . ESOPHAGOGASTRODUODENOSCOPY  08-05-2010  . HEMOSTASIS CONTROL  04/12/2020   Procedure: HEMOSTASIS CONTROL;  Surgeon: Marshell Garfinkel, MD;  Location: [facility];  Service: Cardiopulmonary;;  . IR IMAGING GUIDED PORT INSERTION  05/31/2020  . POLYPECTOMY    . PROSTATE BIOPSY  10/02/14  . RADIOACTIVE SEED IMPLANT N/A 01/30/2015   Procedure: RADIOACTIVE SEED IMPLANT;  Surgeon: Rana Snare, MD;  Location[facility];  Service: Urology;  Laterality: N/A;  . UPPER GASTROINTESTINAL ENDOSCOPY    .  VIDEO BRONCHOSCOPY N/A 04/12/2020   Procedure: VIDEO BRONCHOSCOPY WITHOUT FLUORO;  Surgeon: Marshell Garfinkel, MD;  Location: [facility];  Service: Cardiopulmonary;  Laterality: N/A;    REVIEW OF SYSTEMS:   Review of Systems  Constitutional: Positive for fatigue.  Negative for appetite change, chills, fever and unexpected weight change.  HENT: Negative for mouth sores, nosebleeds, sore throat and trouble swallowing.   Eyes: Negative for eye problems and icterus.  Respiratory: Positive for dyspnea on exertion.  Negative for cough, hemoptysis, and wheezing.   Cardiovascular: Negative for chest pain and leg swelling.  Gastrointestinal: Positive for nausea and vomiting x1 a few days ago. negative for abdominal pain, constipation, and diarrhea.  Genitourinary: Negative for bladder incontinence, difficulty urinating, dysuria, frequency and hematuria.   Musculoskeletal: Negative for back pain, gait problem, neck pain and neck stiffness.  Skin: Negative for itching and rash.   Neurological: Negative for dizziness, extremity weakness, gait problem, headaches, light-headedness and seizures.  Hematological: Negative for adenopathy. Does not bruise/bleed easily.  Psychiatric/Behavioral: Negative for confusion, depression and sleep disturbance. The patient is not nervous/anxious.     PHYSICAL EXAMINATION:  Blood pressure 132/83, pulse 98, temperature 98.5 F (36.9 C), temperature source Tympanic, resp. rate 20, height 6' (1.829 m), weight 159 lb 3.2 oz (72.2 kg), SpO2 98 %.  ECOG PERFORMANCE STATUS: 1 - Symptomatic but completely ambulatory  Physical Exam  Constitutional: Oriented to person, place, and time and well-developed, well-nourished, and in no distress.  HENT:  Head: Normocephalic and atraumatic.  Mouth/Throat: Oropharynx is clear and moist. No oropharyngeal exudate.  Eyes: Conjunctivae are normal. Right eye exhibits no discharge. Left eye exhibits no discharge. No scleral icterus.  Neck: Normal range of motion. Neck supple.  Cardiovascular: Normal rate, regular rhythm, normal heart sounds and intact distal pulses.   Pulmonary/Chest: Effort normal and breath sounds normal. No respiratory distress. No wheezes. No rales.  Abdominal: Soft. Bowel sounds are normal. Exhibits no distension and no mass. There is no tenderness.  Musculoskeletal: Normal range of motion. Exhibits no edema.  Lymphadenopathy:    No cervical adenopathy.  Neurological: Alert and oriented to person, place, and time. Exhibits normal muscle tone. Gait normal. Coordination normal.  Skin: Skin is warm and dry. No rash noted. Not diaphoretic. No erythema. No pallor.  Psychiatric: Mood and judgment normal. The patient has some trouble remembering events from a few weeks ago. Vitals reviewed.  LABORATORY DATA: Lab Results  Component Value Date   WBC 4.7 09/05/2020   HGB 9.8 (L) 09/05/2020   HCT 28.6 (L) 09/05/2020   MCV 100.7 (H) 09/05/2020   PLT 162 09/05/2020      Chemistry       Component Value Date/Time   NA 140 09/05/2020 1103   K 4.1 09/05/2020 1103   CL 103 09/05/2020 1103   CO2 30 09/05/2020 1103   BUN 14 09/05/2020 1103   CREATININE 0.90 09/05/2020 1103      Component Value Date/Time   CALCIUM 9.8 09/05/2020 1103   ALKPHOS 65 09/05/2020 1103   AST 41 09/05/2020 1103   ALT 36 09/05/2020 1103   BILITOT 0.3 09/05/2020 1103       RADIOGRAPHIC STUDIES:  No results found.   ASSESSMENT/PLAN:  This is a very pleasant 59 year old Caucasian male diagnosed with extensive stage small cell lung cancer.  He presented with a left upper lobe lung mass in addition to hilar and mediastinal lymphadenopathy.  He also had metastatic to the disease to the brain with a  solitary brain metastasis.  He was diagnosed in May 2021.  The patient underwent palliative radiotherapy to the left hilar area and mediastinal lymphadenopathy.  He is currently undergoing palliative systemic chemotherapy with carboplatin for an AUC of 5 on days one, etoposide 100 mg per metered squared on days one, two, and three with [place] support.  He is status post four cycles.  Starting from cycle #5, the patient will be on immunotherapy with Imfinzi.   He is currently undergoing brain radiation under the care of Dr. Lisbeth Renshaw.  His last treatment is scheduled for tomorrow on 09/06/2020   Labs were reviewed.  Recommend that he proceed with cycle #5 today scheduled.  We will see him back for follow-up visit in 4 weeks for evaluation before starting cycle #6.  The patient was strongly encouraged to quit smoking.  The patient was advised to call immediately if he has any concerning symptoms in the interval. The patient voices understanding of current disease status and treatment options and is in agreement with the current care plan. All questions were answered. The patient knows to call the clinic with any problems, questions or concerns. We can certainly see the patient much sooner if  necessary      No orders of the defined types were placed in this encounter.    Lael Wetherbee L Beautifull Cisar, PA-C 09/05/20

## 2020-09-04 ENCOUNTER — Other Ambulatory Visit: Payer: Self-pay | Admitting: Medical Oncology

## 2020-09-04 ENCOUNTER — Ambulatory Visit: Payer: 59

## 2020-09-04 ENCOUNTER — Ambulatory Visit
Admission: RE | Admit: 2020-09-04 | Discharge: 2020-09-04 | Disposition: A | Discharge status: Home / Self Care | Payer: 59 | Source: Ambulatory Visit | Attending: Radiation Oncology | Admitting: Radiation Oncology

## 2020-09-04 DIAGNOSIS — C3412 Malignant neoplasm of upper lobe, left bronchus or lung: Secondary | ICD-10-CM

## 2020-09-04 DIAGNOSIS — Z5112 Encounter for antineoplastic immunotherapy: Secondary | ICD-10-CM | POA: Diagnosis not present

## 2020-09-05 ENCOUNTER — Inpatient Hospital Stay: Payer: 59

## 2020-09-05 ENCOUNTER — Ambulatory Visit
Admission: RE | Admit: 2020-09-05 | Discharge: 2020-09-05 | Disposition: A | Discharge status: Home / Self Care | Payer: 59 | Source: Ambulatory Visit | Attending: Radiation Oncology | Admitting: Radiation Oncology

## 2020-09-05 ENCOUNTER — Ambulatory Visit: Payer: 59

## 2020-09-05 ENCOUNTER — Inpatient Hospital Stay (HOSPITAL_BASED_OUTPATIENT_CLINIC_OR_DEPARTMENT_OTHER): Payer: 59 | Admitting: Physician Assistant

## 2020-09-05 ENCOUNTER — Inpatient Hospital Stay: Payer: 59 | Attending: Internal Medicine

## 2020-09-05 ENCOUNTER — Inpatient Hospital Stay: Payer: 59 | Admitting: General Practice

## 2020-09-05 ENCOUNTER — Other Ambulatory Visit: Payer: Self-pay

## 2020-09-05 VITALS — BP 132/83 | HR 98 | Temp 98.5°F | Resp 20 | Ht 72.0 in | Wt 159.2 lb

## 2020-09-05 DIAGNOSIS — K219 Gastro-esophageal reflux disease without esophagitis: Secondary | ICD-10-CM | POA: Insufficient documentation

## 2020-09-05 DIAGNOSIS — Z95828 Presence of other vascular implants and grafts: Secondary | ICD-10-CM

## 2020-09-05 DIAGNOSIS — C7931 Secondary malignant neoplasm of brain: Secondary | ICD-10-CM | POA: Diagnosis not present

## 2020-09-05 DIAGNOSIS — C3412 Malignant neoplasm of upper lobe, left bronchus or lung: Secondary | ICD-10-CM

## 2020-09-05 DIAGNOSIS — Z5112 Encounter for antineoplastic immunotherapy: Secondary | ICD-10-CM | POA: Diagnosis not present

## 2020-09-05 DIAGNOSIS — N529 Male erectile dysfunction, unspecified: Secondary | ICD-10-CM | POA: Insufficient documentation

## 2020-09-05 DIAGNOSIS — Z79899 Other long term (current) drug therapy: Secondary | ICD-10-CM | POA: Insufficient documentation

## 2020-09-05 DIAGNOSIS — Z923 Personal history of irradiation: Secondary | ICD-10-CM | POA: Insufficient documentation

## 2020-09-05 DIAGNOSIS — F17210 Nicotine dependence, cigarettes, uncomplicated: Secondary | ICD-10-CM | POA: Insufficient documentation

## 2020-09-05 DIAGNOSIS — Z8546 Personal history of malignant neoplasm of prostate: Secondary | ICD-10-CM | POA: Insufficient documentation

## 2020-09-05 DIAGNOSIS — I10 Essential (primary) hypertension: Secondary | ICD-10-CM | POA: Insufficient documentation

## 2020-09-05 DIAGNOSIS — E785 Hyperlipidemia, unspecified: Secondary | ICD-10-CM | POA: Insufficient documentation

## 2020-09-05 DIAGNOSIS — Z7982 Long term (current) use of aspirin: Secondary | ICD-10-CM | POA: Insufficient documentation

## 2020-09-05 LAB — CMP (CANCER CENTER ONLY)
ALT: 36 U/L (ref 0–44)
AST: 41 U/L (ref 15–41)
Albumin: 4.1 g/dL (ref 3.5–5.0)
Alkaline Phosphatase: 65 U/L (ref 38–126)
Anion gap: 7 (ref 5–15)
BUN: 14 mg/dL (ref 6–20)
CO2: 30 mmol/L (ref 22–32)
Calcium: 9.8 mg/dL (ref 8.9–10.3)
Chloride: 103 mmol/L (ref 98–111)
Creatinine: 0.9 mg/dL (ref 0.61–1.24)
GFR, Estimated: >60 mL/min (ref >60)
Glucose, Bld: 107 mg/dL — ABNORMAL HIGH (ref 70–99)
Potassium: 4.1 mmol/L (ref 3.5–5.1)
Sodium: 140 mmol/L (ref 135–145)
Total Bilirubin: 0.3 mg/dL (ref 0.3–1.2)
Total Protein: 7.7 g/dL (ref 6.5–8.1)

## 2020-09-05 LAB — CBC WITH DIFFERENTIAL (CANCER CENTER ONLY)
Abs Immature Granulocytes: 0.01 10*3/uL (ref 0.00–0.07)
Basophils Absolute: 0 10*3/uL (ref 0.0–0.1)
Basophils Relative: 0 %
Eosinophils Absolute: 0.3 10*3/uL (ref 0.0–0.5)
Eosinophils Relative: 5 %
HCT: 28.6 % — ABNORMAL LOW (ref 39.0–52.0)
Hemoglobin: 9.8 g/dL — ABNORMAL LOW (ref 13.0–17.0)
Immature Granulocytes: 0 %
Lymphocytes Relative: 12 %
Lymphs Abs: 0.6 10*3/uL — ABNORMAL LOW (ref 0.7–4.0)
MCH: 34.5 pg — ABNORMAL HIGH (ref 26.0–34.0)
MCHC: 34.3 g/dL (ref 30.0–36.0)
MCV: 100.7 fL — ABNORMAL HIGH (ref 80.0–100.0)
Monocytes Absolute: 0.3 10*3/uL (ref 0.1–1.0)
Monocytes Relative: 6 %
Neutro Abs: 3.5 10*3/uL (ref 1.7–7.7)
Neutrophils Relative %: 77 %
Platelet Count: 162 10*3/uL (ref 150–400)
RBC: 2.84 MIL/uL — ABNORMAL LOW (ref 4.22–5.81)
RDW: 17.4 % — ABNORMAL HIGH (ref 11.5–15.5)
WBC Count: 4.7 10*3/uL (ref 4.0–10.5)
nRBC: 0 % (ref 0.0–0.2)

## 2020-09-05 LAB — TSH: TSH: 0.591 u[IU]/mL (ref 0.320–4.118)

## 2020-09-05 MED ORDER — SODIUM CHLORIDE 0.9 % IV SOLN
Freq: Once | INTRAVENOUS | Status: AC
  Filled 2020-09-05: qty 250

## 2020-09-05 MED ORDER — SODIUM CHLORIDE 0.9% FLUSH
10.0000 mL | Freq: Once | INTRAVENOUS | Status: AC
  Administered 2020-09-05: 10 mL
  Filled 2020-09-05: qty 10

## 2020-09-05 MED ORDER — SODIUM CHLORIDE 0.9% FLUSH
10.0000 mL | INTRAVENOUS | Status: DC | PRN
  Filled 2020-09-05: qty 10

## 2020-09-05 MED ORDER — SODIUM CHLORIDE 0.9 % IV SOLN
1500.0000 mg | Freq: Once | INTRAVENOUS | Status: AC
  Administered 2020-09-05: 1500 mg via INTRAVENOUS
  Filled 2020-09-05: qty 30

## 2020-09-05 MED ORDER — HEPARIN SOD (PORK) LOCK FLUSH 100 UNIT/ML IV SOLN
500.0000 [IU] | Freq: Once | INTRAVENOUS | Status: DC | PRN
  Filled 2020-09-05: qty 5

## 2020-09-05 NOTE — Progress Notes (Signed)
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  CSW also verified that he has exhausted [employer].  Provided reapplication for gas card from [place] program - secure emailed application to [employer] [facility].  Patient does not remember receiving a card from this agency before, CSW will verify his current eligibility w agency.  Edwyna Shell, LCSW Clinical Social Worker Phone:  [phone]

## 2020-09-05 NOTE — Patient Instructions (Signed)

## 2020-09-05 NOTE — Patient Instructions (Signed)
[facility] Discharge Instructions for Patients Receiving Chemotherapy  Today you received the following chemotherapy agent: Imfinzi  To help prevent nausea and vomiting after your treatment, we encourage you to take your nausea medication as directed.   If you develop nausea and vomiting that is not controlled by your nausea medication, call the clinic.   BELOW ARE SYMPTOMS THAT SHOULD BE REPORTED IMMEDIATELY:  *FEVER GREATER THAN 100.5 F  *CHILLS WITH OR WITHOUT FEVER  NAUSEA AND VOMITING THAT IS NOT CONTROLLED WITH YOUR NAUSEA MEDICATION  *UNUSUAL SHORTNESS OF BREATH  *UNUSUAL BRUISING OR BLEEDING  TENDERNESS IN MOUTH AND THROAT WITH OR WITHOUT PRESENCE OF ULCERS  *URINARY PROBLEMS  *BOWEL PROBLEMS  UNUSUAL RASH Items with * indicate a potential emergency and should be followed up as soon as possible.  Feel free to call the clinic should you have any questions or concerns. The clinic phone number is [phone].  Please show the [facility] at check-in to the Emergency Department and triage nurse.

## 2020-09-06 ENCOUNTER — Ambulatory Visit
Admission: RE | Admit: 2020-09-06 | Discharge: 2020-09-06 | Disposition: A | Discharge status: Home / Self Care | Payer: 59 | Source: Ambulatory Visit | Attending: Radiation Oncology | Admitting: Radiation Oncology

## 2020-09-06 ENCOUNTER — Telehealth: Payer: Self-pay | Admitting: Physician Assistant

## 2020-09-06 ENCOUNTER — Encounter: Payer: Self-pay | Admitting: Radiation Oncology

## 2020-09-06 DIAGNOSIS — Z5112 Encounter for antineoplastic immunotherapy: Secondary | ICD-10-CM | POA: Diagnosis not present

## 2020-09-06 NOTE — Telephone Encounter (Signed)
Scheduled per los. Called and left msg. Mailed printotu

## 2020-09-10 ENCOUNTER — Other Ambulatory Visit: Payer: Self-pay | Admitting: Radiation Therapy

## 2020-09-10 DIAGNOSIS — C7931 Secondary malignant neoplasm of brain: Secondary | ICD-10-CM

## 2020-09-18 ENCOUNTER — Other Ambulatory Visit: Payer: Self-pay | Admitting: Radiation Oncology

## 2020-09-18 ENCOUNTER — Encounter: Payer: Self-pay | Admitting: Radiation Oncology

## 2020-09-22 ENCOUNTER — Other Ambulatory Visit: Payer: Self-pay | Admitting: Internal Medicine

## 2020-09-23 ENCOUNTER — Encounter: Payer: Self-pay | Admitting: Radiation Oncology

## 2020-09-23 ENCOUNTER — Other Ambulatory Visit: Payer: Self-pay | Admitting: Radiation Oncology

## 2020-09-23 MED ORDER — MEMANTINE HCL 10 MG PO TABS: 10.0000 mg | ORAL_TABLET | Freq: Two times a day (BID) | ORAL | 4 refills | Status: DC

## 2020-09-24 ENCOUNTER — Telehealth: Payer: Self-pay | Admitting: *Deleted

## 2020-09-24 NOTE — Telephone Encounter (Signed)
Spoke with the patient to let him know we received his mychart message.  He continues to have nausea.  At this time it is our recommendation for him to stop taking his Namenda for about a week and see if the nausea disappears.  Her verbalized understanding and will reach out to [place] is his nausea worsens otherwise we will reconnect in about a weeks time.    Gloriajean Dell. Leonie Green, BSN

## 2020-10-01 ENCOUNTER — Telehealth: Payer: Self-pay | Admitting: Medical Oncology

## 2020-10-01 ENCOUNTER — Inpatient Hospital Stay: Payer: 59 | Attending: Internal Medicine | Admitting: Medical

## 2020-10-01 ENCOUNTER — Inpatient Hospital Stay: Payer: 59

## 2020-10-01 DIAGNOSIS — Z923 Personal history of irradiation: Secondary | ICD-10-CM | POA: Insufficient documentation

## 2020-10-01 DIAGNOSIS — R1310 Dysphagia, unspecified: Secondary | ICD-10-CM | POA: Insufficient documentation

## 2020-10-01 DIAGNOSIS — Z79899 Other long term (current) drug therapy: Secondary | ICD-10-CM | POA: Insufficient documentation

## 2020-10-01 DIAGNOSIS — C3412 Malignant neoplasm of upper lobe, left bronchus or lung: Secondary | ICD-10-CM | POA: Insufficient documentation

## 2020-10-01 DIAGNOSIS — Z7982 Long term (current) use of aspirin: Secondary | ICD-10-CM | POA: Insufficient documentation

## 2020-10-01 DIAGNOSIS — C7931 Secondary malignant neoplasm of brain: Secondary | ICD-10-CM | POA: Insufficient documentation

## 2020-10-01 DIAGNOSIS — Z5112 Encounter for antineoplastic immunotherapy: Secondary | ICD-10-CM | POA: Insufficient documentation

## 2020-10-01 DIAGNOSIS — E785 Hyperlipidemia, unspecified: Secondary | ICD-10-CM | POA: Insufficient documentation

## 2020-10-01 DIAGNOSIS — I10 Essential (primary) hypertension: Secondary | ICD-10-CM | POA: Insufficient documentation

## 2020-10-01 DIAGNOSIS — Z8546 Personal history of malignant neoplasm of prostate: Secondary | ICD-10-CM | POA: Insufficient documentation

## 2020-10-01 NOTE — Telephone Encounter (Signed)
new pain on side and under armpit. EMS came out -EKG normal . Will schedule with [facility].

## 2020-10-02 ENCOUNTER — Inpatient Hospital Stay: Payer: 59

## 2020-10-02 ENCOUNTER — Other Ambulatory Visit: Payer: Self-pay

## 2020-10-02 ENCOUNTER — Encounter: Payer: Self-pay | Admitting: Internal Medicine

## 2020-10-02 ENCOUNTER — Inpatient Hospital Stay (HOSPITAL_BASED_OUTPATIENT_CLINIC_OR_DEPARTMENT_OTHER): Payer: 59 | Admitting: Internal Medicine

## 2020-10-02 VITALS — BP 94/70 | HR 103 | Temp 97.1°F | Resp 18 | Ht 72.0 in | Wt 161.1 lb

## 2020-10-02 DIAGNOSIS — C3412 Malignant neoplasm of upper lobe, left bronchus or lung: Secondary | ICD-10-CM | POA: Diagnosis present

## 2020-10-02 DIAGNOSIS — Z5112 Encounter for antineoplastic immunotherapy: Secondary | ICD-10-CM

## 2020-10-02 DIAGNOSIS — C3490 Malignant neoplasm of unspecified part of unspecified bronchus or lung: Secondary | ICD-10-CM | POA: Diagnosis not present

## 2020-10-02 DIAGNOSIS — Z95828 Presence of other vascular implants and grafts: Secondary | ICD-10-CM

## 2020-10-02 DIAGNOSIS — Z79899 Other long term (current) drug therapy: Secondary | ICD-10-CM | POA: Diagnosis not present

## 2020-10-02 DIAGNOSIS — Z8546 Personal history of malignant neoplasm of prostate: Secondary | ICD-10-CM | POA: Diagnosis not present

## 2020-10-02 DIAGNOSIS — R1310 Dysphagia, unspecified: Secondary | ICD-10-CM | POA: Diagnosis not present

## 2020-10-02 DIAGNOSIS — Z923 Personal history of irradiation: Secondary | ICD-10-CM | POA: Diagnosis not present

## 2020-10-02 DIAGNOSIS — I10 Essential (primary) hypertension: Secondary | ICD-10-CM | POA: Diagnosis not present

## 2020-10-02 DIAGNOSIS — C7931 Secondary malignant neoplasm of brain: Secondary | ICD-10-CM | POA: Diagnosis present

## 2020-10-02 DIAGNOSIS — Z7982 Long term (current) use of aspirin: Secondary | ICD-10-CM | POA: Diagnosis not present

## 2020-10-02 DIAGNOSIS — E785 Hyperlipidemia, unspecified: Secondary | ICD-10-CM | POA: Diagnosis not present

## 2020-10-02 LAB — CMP (CANCER CENTER ONLY)
ALT: 27 U/L (ref 0–44)
AST: 27 U/L (ref 15–41)
Albumin: 3.4 g/dL — ABNORMAL LOW (ref 3.5–5.0)
Alkaline Phosphatase: 67 U/L (ref 38–126)
Anion gap: 9 (ref 5–15)
BUN: 18 mg/dL (ref 6–20)
CO2: 27 mmol/L (ref 22–32)
Calcium: 8.9 mg/dL (ref 8.9–10.3)
Chloride: 98 mmol/L (ref 98–111)
Creatinine: 1.14 mg/dL (ref 0.61–1.24)
GFR, Estimated: >60 mL/min (ref >60)
Glucose, Bld: 125 mg/dL — ABNORMAL HIGH (ref 70–99)
Potassium: 3.3 mmol/L — ABNORMAL LOW (ref 3.5–5.1)
Sodium: 134 mmol/L — ABNORMAL LOW (ref 135–145)
Total Bilirubin: 0.9 mg/dL (ref 0.3–1.2)
Total Protein: 7.3 g/dL (ref 6.5–8.1)

## 2020-10-02 LAB — CBC WITH DIFFERENTIAL (CANCER CENTER ONLY)
Abs Immature Granulocytes: 0.13 10*3/uL — ABNORMAL HIGH (ref 0.00–0.07)
Basophils Absolute: 0 10*3/uL (ref 0.0–0.1)
Basophils Relative: 0 %
Eosinophils Absolute: 0.1 10*3/uL (ref 0.0–0.5)
Eosinophils Relative: 1 %
HCT: 29.4 % — ABNORMAL LOW (ref 39.0–52.0)
Hemoglobin: 10.4 g/dL — ABNORMAL LOW (ref 13.0–17.0)
Immature Granulocytes: 1 %
Lymphocytes Relative: 2 %
Lymphs Abs: 0.2 10*3/uL — ABNORMAL LOW (ref 0.7–4.0)
MCH: 36.4 pg — ABNORMAL HIGH (ref 26.0–34.0)
MCHC: 35.4 g/dL (ref 30.0–36.0)
MCV: 102.8 fL — ABNORMAL HIGH (ref 80.0–100.0)
Monocytes Absolute: 0.4 10*3/uL (ref 0.1–1.0)
Monocytes Relative: 3 %
Neutro Abs: 11.4 10*3/uL — ABNORMAL HIGH (ref 1.7–7.7)
Neutrophils Relative %: 93 %
Platelet Count: 141 10*3/uL — ABNORMAL LOW (ref 150–400)
RBC: 2.86 MIL/uL — ABNORMAL LOW (ref 4.22–5.81)
RDW: 13.3 % (ref 11.5–15.5)
WBC Count: 12.3 10*3/uL — ABNORMAL HIGH (ref 4.0–10.5)
nRBC: 0 % (ref 0.0–0.2)

## 2020-10-02 LAB — TSH: TSH: 1.438 u[IU]/mL (ref 0.320–4.118)

## 2020-10-02 MED ORDER — SODIUM CHLORIDE 0.9 % IV SOLN
Freq: Once | INTRAVENOUS | Status: AC
  Filled 2020-10-02: qty 250

## 2020-10-02 MED ORDER — SODIUM CHLORIDE 0.9% FLUSH
10.0000 mL | Freq: Once | INTRAVENOUS | Status: AC
  Administered 2020-10-02: 10 mL
  Filled 2020-10-02: qty 10

## 2020-10-02 MED ORDER — HEPARIN SOD (PORK) LOCK FLUSH 100 UNIT/ML IV SOLN
500.0000 [IU] | Freq: Once | INTRAVENOUS | Status: AC | PRN
  Administered 2020-10-02: 500 [IU]
  Filled 2020-10-02: qty 5

## 2020-10-02 MED ORDER — SODIUM CHLORIDE 0.9 % IV SOLN
1500.0000 mg | Freq: Once | INTRAVENOUS | Status: AC
  Administered 2020-10-02: 1500 mg via INTRAVENOUS
  Filled 2020-10-02: qty 30

## 2020-10-02 MED ORDER — SODIUM CHLORIDE 0.9% FLUSH
10.0000 mL | INTRAVENOUS | Status: DC | PRN
  Administered 2020-10-02: 10 mL
  Filled 2020-10-02: qty 10

## 2020-10-02 NOTE — Patient Instructions (Signed)

## 2020-10-02 NOTE — Patient Instructions (Signed)
[facility] Discharge Instructions for Patients Receiving Chemotherapy  Today you received the following chemotherapy agent: Imfinzi  To help prevent nausea and vomiting after your treatment, we encourage you to take your nausea medication as directed.   If you develop nausea and vomiting that is not controlled by your nausea medication, call the clinic.   BELOW ARE SYMPTOMS THAT SHOULD BE REPORTED IMMEDIATELY:  *FEVER GREATER THAN 100.5 F  *CHILLS WITH OR WITHOUT FEVER  NAUSEA AND VOMITING THAT IS NOT CONTROLLED WITH YOUR NAUSEA MEDICATION  *UNUSUAL SHORTNESS OF BREATH  *UNUSUAL BRUISING OR BLEEDING  TENDERNESS IN MOUTH AND THROAT WITH OR WITHOUT PRESENCE OF ULCERS  *URINARY PROBLEMS  *BOWEL PROBLEMS  UNUSUAL RASH Items with * indicate a potential emergency and should be followed up as soon as possible.  Feel free to call the clinic should you have any questions or concerns. The clinic phone number is [phone].  Please show the [facility] at check-in to the Emergency Department and triage nurse.

## 2020-10-02 NOTE — Progress Notes (Signed)
Richard Santos Telephone:(336) [phone]   Fax:(336) [phone]  OFFICE PROGRESS NOTE  [name], MD [place] [id number]  DIAGNOSIS: Extensive stage (T2b, N2, M1c) small cell lung cancer presented with left upper lobe pulmonary nodule in addition to left hilar mass with mediastinal invasion and solitary brain metastasis diagnosed in May 2021  PRIOR THERAPY: Palliative radiation to the left hilar and mediastinal lymphadenopathy under the care of Dr. Lisbeth Renshaw.  CURRENT THERAPY:  Palliative systemic chemotherapy with carboplatin for AUC of 5 on day 1, etoposide 100 mg/M2 on days 1, 2 and 3 as well as Imfinzi 1500 mg IV every 3 weeks with the chemotherapy.  First dose of chemotherapy May 06, 2020.  The patient will receive treatment with Cosela on the days of his chemotherapy.  Status post 5 cycles.  Starting from cycle #5 he will be.  He did wear his maintenance immunotherapy with Imfinzi 1500 mg IV every 4 weeks. Status post 1 cycle.  INTERVAL HISTORY: Richard Santos 59 y.o. male returns to the clinic today for follow-up visit. The patient is feeling fine today with no concerning complaints except for left-sided chest pain earlier in the morning today. He was seen by EMS at home and EKG showed no concerning findings for a cardiac condition. The patient is feeling much better today. He denied having any shortness of breath or palpitation. He denied having any nausea, vomiting, diarrhea or constipation. He denied having any fever or chills. He has no headache or visual changes. He is here today for evaluation before starting cycle #2 of his treatment with Imfinzi. He was on treatment with Namenda for memory issues but he could not tolerate this treatment and he stopped it a week ago.  MEDICAL HISTORY: Past Medical History:  Diagnosis Date  . Allergic rhinitis   . Allergy   . Anxiety   . Chronic low back pain   . DDD (degenerative disc disease), lumbar   . ED  (erectile dysfunction)   . Finger injury    cut pads off 3rd and 4th finger left hand/due to lawn mower accident  . GERD (gastroesophageal reflux disease)   . Heart murmur    as child only-   . History of kidney stones   . HTN (hypertension) 08/20/2016  . Hyperlipidemia   . Incomplete right bundle branch block   . Prostate cancer ([place]) dx 10/02/14   stage T1c  . Prostate cancer ([place])   . Sigmoid diverticulosis   . Wears glasses     ALLERGIES:  is allergic to sulfur, bee venom, and sulfa antibiotics.  MEDICATIONS:  Current Outpatient Medications  Medication Sig Dispense Refill  . acetaminophen (TYLENOL) 325 MG tablet Take 2 tablets (650 mg total) by mouth every 6 (six) hours as needed for mild pain (or Fever >/= 101). 30 tablet 0  . aspirin EC 81 MG tablet Take 81 mg by mouth daily. Swallow whole.    Marland Kitchen atorvastatin (LIPITOR) 40 MG tablet Take 1 tablet (40 mg total) by mouth daily. 90 tablet 3  . Budeson-Glycopyrrol-Formoterol (BREZTRI AEROSPHERE) 160-9-4.8 MCG/ACT AERO Inhale 2 puffs into the lungs in the morning and at bedtime. 10.7 g 2  . busPIRone (BUSPAR) 10 MG tablet Take 1 tablet (10 mg total) by mouth 2 (two) times daily. 180 tablet 3  . EPINEPHrine (EPIPEN) 0.3 mg/0.3 mL IJ SOAJ injection Inject 0.3 mLs (0.3 mg total) into the muscle once. (Patient not taking: Reported  on 09/05/2020) 2 Device 2  . lidocaine-prilocaine (EMLA) cream Apply 1 application topically as needed. 30 g 2  . memantine (NAMENDA) 10 MG tablet Take 1 tablet (10 mg total) by mouth 2 (two) times daily. 60 tablet 4  . Multiple Vitamin (MULTIVITAMIN) capsule Take 1 capsule by mouth daily.    . nicotine (NICODERM CQ - DOSED IN MG/24 HOURS) 21 mg/24hr patch Place 1 patch (21 mg total) onto the skin daily. (Patient not taking: Reported on 09/05/2020) 28 patch 0  . nicotine (NICODERM CQ) 14 mg/24hr patch Place 1 patch (14 mg total) onto the skin daily. (Patient not taking: Reported on 09/05/2020) 28 patch 3  .  pantoprazole (PROTONIX) 40 MG tablet Take 1 tablet (40 mg total) by mouth daily before breakfast. 90 tablet 3  . prochlorperazine (COMPAZINE) 10 MG tablet TAKE 1 TABLET (10 MG TOTAL) BY MOUTH EVERY 6 (SIX) HOURS AS NEEDED FOR NAUSEA OR VOMITING. 30 tablet 0  . traMADol (ULTRAM) 50 MG tablet TAKE HALF TABLET BY MOUTH 2 TIMES DAILY AS NEEDED FOR PAIN (Patient taking differently: Take 25 mg by mouth every 12 (twelve) hours as needed for moderate pain. TAKE HALF TABLET BY MOUTH 2 TIMES DAILY AS NEEDED FOR PAIN) 60 tablet 2  . triamcinolone cream (KENALOG) 0.5 % Apply 1 application topically 3 (three) times daily. 90 g 1   No current facility-administered medications for this visit.    SURGICAL HISTORY:  Past Surgical History:  Procedure Laterality Date  . BRONCHIAL BIOPSY  04/12/2020   Procedure: BRONCHIAL BIOPSIES;  Surgeon: Marshell Garfinkel, MD;  Location: [facility];  Service: Cardiopulmonary;;  . BRONCHIAL BRUSHINGS  04/12/2020   Procedure: BRONCHIAL BRUSHINGS;  Surgeon: Marshell Garfinkel, MD;  Location: [facility];  Service: Cardiopulmonary;;  . BRONCHIAL NEEDLE ASPIRATION BIOPSY  04/12/2020   Procedure: BRONCHIAL NEEDLE ASPIRATION BIOPSIES;  Surgeon: Marshell Garfinkel, MD;  Location: [facility];  Service: Cardiopulmonary;;  . BRONCHIAL WASHINGS  04/12/2020   Procedure: BRONCHIAL WASHINGS;  Surgeon: Marshell Garfinkel, MD;  Location: [facility];  Service: Cardiopulmonary;;  . COLONOSCOPY    . COLONOSCOPY W/ POLYPECTOMY  04-19-2012  . ENDOBRONCHIAL ULTRASOUND N/A 04/12/2020   Procedure: ENDOBRONCHIAL ULTRASOUND;  Surgeon: Marshell Garfinkel, MD;  Location: [facility];  Service: Cardiopulmonary;  Laterality: N/A;  . ESOPHAGOGASTRODUODENOSCOPY  08-05-2010  . HEMOSTASIS CONTROL  04/12/2020   Procedure: HEMOSTASIS CONTROL;  Surgeon: Marshell Garfinkel, MD;  Location: [facility];  Service: Cardiopulmonary;;  . IR IMAGING GUIDED PORT INSERTION  05/31/2020  . POLYPECTOMY    . PROSTATE BIOPSY  10/02/14  .  RADIOACTIVE SEED IMPLANT N/A 01/30/2015   Procedure: RADIOACTIVE SEED IMPLANT;  Surgeon: Rana Snare, MD;  Location: [facility];  Service: Urology;  Laterality: N/A;  . UPPER GASTROINTESTINAL ENDOSCOPY    . VIDEO BRONCHOSCOPY N/A 04/12/2020   Procedure: VIDEO BRONCHOSCOPY WITHOUT FLUORO;  Surgeon: Marshell Garfinkel, MD;  Location: [facility];  Service: Cardiopulmonary;  Laterality: N/A;    REVIEW OF SYSTEMS:  A comprehensive review of systems was negative except for: Constitutional: positive for fatigue Respiratory: positive for pleurisy/chest pain Gastrointestinal: positive for nausea   PHYSICAL EXAMINATION: General appearance: alert, cooperative, fatigued and no distress Head: Normocephalic, without obvious abnormality, atraumatic Neck: no adenopathy, no JVD, supple, symmetrical, trachea midline and thyroid not enlarged, symmetric, no tenderness/mass/nodules Lymph nodes: Cervical, supraclavicular, and axillary nodes normal. Resp: clear to auscultation bilaterally Back: symmetric, no curvature. ROM normal. No CVA tenderness. Cardio: regular rate and rhythm, S1, S2 normal, no murmur, click, rub  or gallop GI: soft, non-tender; bowel sounds normal; no masses,  no organomegaly Extremities: extremities normal, atraumatic, no cyanosis or edema  ECOG PERFORMANCE STATUS: 1 - Symptomatic but completely ambulatory  Blood pressure 94/70, pulse (!) 103, temperature (!) 97.1 F (36.2 C), temperature source Tympanic, resp. rate 18, height 6' (1.829 m), weight 161 lb 1.6 oz (73.1 kg), SpO2 100 %.  LABORATORY DATA: Lab Results  Component Value Date   WBC 12.3 (H) 10/02/2020   HGB 10.4 (L) 10/02/2020   HCT 29.4 (L) 10/02/2020   MCV 102.8 (H) 10/02/2020   PLT 141 (L) 10/02/2020      Chemistry      Component Value Date/Time   NA 134 (L) 10/02/2020 1145   K 3.3 (L) 10/02/2020 1145   CL 98 10/02/2020 1145   CO2 27 10/02/2020 1145   BUN 18 10/02/2020 1145   CREATININE 1.14  10/02/2020 1145      Component Value Date/Time   CALCIUM 8.9 10/02/2020 1145   ALKPHOS 67 10/02/2020 1145   AST 27 10/02/2020 1145   ALT 27 10/02/2020 1145   BILITOT 0.9 10/02/2020 1145       RADIOGRAPHIC STUDIES: No results found.  ASSESSMENT AND PLAN: This is a very pleasant 59 years old white male recently diagnosed with extensive stage small cell lung cancer presented with left upper lobe lung mass in addition to left hilar and mediastinal lymphadenopathy as well as solitary brain metastasis diagnosed in May 2021. He underwent palliative radiotherapy to the left hilar and mediastinal lymphadenopathy in addition to the radiotherapy to the brain metastasis.  He continues to have residual odynophagia and dysphagia from the palliative radiotherapy. He is currently undergoing systemic chemotherapy with carboplatin for AUC of 5 on day 1, etoposide 100 mg/M2 on days 1, 2 and 3 with Cosela infusion before chemotherapy.  He is status post 5 cycles of treatment  Starting from cycle #5 the patient is on treatment with maintenance Imfinzi every 4 weeks status post 1 cycle. He tolerated the last cycle of his treatment fairly well with no concerning adverse effects. I recommended for the patient to proceed with cycle #2 today as planned. For the chest pain earlier today, he was seen by EMS and there was no cardiac etiology for his condition. The patient will continue to monitor it closely and go immediately to the emergency department if he has any recurrent episodes. I will see him back for follow-up visit in 4 weeks for evaluation with repeat CT scan of the chest, abdomen pelvis for restaging of his disease. He was advised to call immediately if he has any concerning symptoms in the interval. The patient voices understanding of current disease status and treatment options and is in agreement with the current care plan.  All questions were answered. The patient knows to call the clinic with any  problems, questions or concerns. We can certainly see the patient much sooner if necessary.   Disclaimer: This note was dictated with voice recognition software. Similar sounding words can inadvertently be transcribed and may not be corrected upon review.

## 2020-10-02 NOTE — Progress Notes (Signed)
Per Dr Julien Nordmann ,it is okay to treat pt today with Imfinzi and heart rate of 103

## 2020-10-04 ENCOUNTER — Ambulatory Visit: Payer: 59 | Admitting: Internal Medicine

## 2020-10-06 ENCOUNTER — Ambulatory Visit: Payer: 59 | Attending: Internal Medicine

## 2020-10-06 DIAGNOSIS — Z23 Encounter for immunization: Secondary | ICD-10-CM

## 2020-10-06 NOTE — Progress Notes (Signed)
   Covid-19 Vaccination Clinic  Name:  Richard Santos    MRN: [id number] DOB: 06-27-1961  10/06/2020  [name] was observed post Covid-19 immunization for 30 minutes based on pre-vaccination screening without incident. He was provided with Vaccine Information Sheet and instruction to access the V-Safe system.   [name] was instructed to call 911 with any severe reactions post vaccine: Marland Kitchen Difficulty breathing  . Swelling of face and throat  . A fast heartbeat  . A bad rash all over body  . Dizziness and weakness   Immunizations Administered    Name Date Dose VIS Date Route   Moderna COVID-19 Vaccine 10/06/2020 11:53 AM 0.5 mL 09/11/2020 Intramuscular   Manufacturer: Moderna   Lot: [id number]   [place]: [id number]

## 2020-10-07 ENCOUNTER — Telehealth: Payer: Self-pay | Admitting: Radiation Oncology

## 2020-10-07 ENCOUNTER — Telehealth: Payer: Self-pay | Admitting: *Deleted

## 2020-10-07 NOTE — Telephone Encounter (Signed)
Called pt in response to message received from On-Call RN from 10/05/20.  Pt had low BP & was instructed to call 911.  They actually called for pt since he was alone & weak.  Pt reports that paramedics checked his BP & systolic was 937 & told him he needed a smaller cuff b/c of his size.  He was also told that they didn't have an ambulance to take him to [facility] so he decided not to go anywhere. He states that last week was rough but feeling some better.  He denies N/V  & diarrhea.  Instructed to drink plenty of non-caffeine fluids & to call if needs anything.  He expressed understanding.

## 2020-10-07 NOTE — Telephone Encounter (Signed)
  Radiation Oncology         (336) [phone] ________________________________  Name: DEMPSY DAMIANO MRN: [id number]  Date of Service: 10/09/20  DOB: [birth year]  Post Treatment Telephone Note  Diagnosis:   Extensive stage small cell lung cancer arising in the left lung   Interval Since Last Radiation:  5 weeks   08/26/20-09/06/20 Prophylactic Cranial Irradiation (PCI)Treatment: The patient's whole brain was treated to 25 Gy in 10 fractions   04/25/20 - 05/15/20: The patient was treated to the disease within the left lung initially to a dose of 37.5 Gy in 15 fractions using a 3 field, 3-D conformal technique.   2016:  The prostate was treated with radioactive seeds with the following prescription: Prostate 14,500 cGy. Isotope: I-125 utilizing 79 seeds and 29 active needles. Individual seed activity 0.47 mCi per seed for a total implant activity of 37.13 mCi.  Narrative:  The patient was contacted today for routine follow-up. During treatment he did very well with radiotherapy and did not have significant desquamation. He did have trouble nausea felt to be related to Namenda we prescribed to try to prevent cognitive changes from radiotherapy.  He stopped this for about a week beginning 09/24/20 and reports he has had some persistent nausea up until this week. He has decided to forgo additional Namenda.  Impression/Plan: 1. Extensive stage small cell lung cancer arising in the left lung  The patient has been doing well since completion of radiotherapy. We discussed that we would continue to follow him at 3 month intervals the first year from treatment and then every 6 months there after with MRI of the brain to rule out CNS progression, but he will also continue to follow up with Dr. Julien Nordmann in medical oncology.  2. Nausea secondary to Namenda. We reviewed the patient's course and I agree that if quality of life suffers due to side effects from medication that is meant to improve quality of life, this  should be discontinued. He is in agreement with this plan.     Carola Rhine, PAC

## 2020-10-09 ENCOUNTER — Other Ambulatory Visit: Payer: Self-pay | Admitting: Internal Medicine

## 2020-10-09 NOTE — Telephone Encounter (Signed)
Please refill as per office routine med refill policy (all routine meds refilled for 3 mo or monthly per pt preference up to one year from last visit, then month to month grace period for 3 mo, then further med refills will have to be denied)  

## 2020-10-10 MED ORDER — ANORO ELLIPTA 62.5-25 MCG/INH IN AEPB: 1.0000 | INHALATION_SPRAY | Freq: Every day | RESPIRATORY_TRACT | 2 refills | Status: DC

## 2020-10-10 NOTE — Telephone Encounter (Signed)
Please prescribe Anoro elipta

## 2020-10-10 NOTE — Telephone Encounter (Signed)
Anoro ordered for pt and pt notified Anoro was called in. Nothing further needed at this time.

## 2020-10-10 NOTE — Telephone Encounter (Signed)
Dr Vaughan Browner, please advise on pt email, thanks!   We went today to pick up the refill for my inhaler and insurance is now saying that they will not cover it. I only paid $40 last month but now they are saying that was a mistake and they would not cover it. The cost for me to pay is $647 and I cannot pay that I have not worked since May 20th.  Here is a list of inhalers that they will cover and I am wanting to see if you can change me to one of the ones I am listing below. Advair Breoe Llipta Anoro If you can please let me know if you can change this I would appreciate it. My pharmacy is [facility]  Thank you

## 2020-10-14 ENCOUNTER — Other Ambulatory Visit: Payer: Self-pay | Admitting: Internal Medicine

## 2020-10-14 ENCOUNTER — Encounter: Payer: Self-pay | Admitting: Internal Medicine

## 2020-10-14 MED ORDER — ONDANSETRON 8 MG PO TBDP: 8.0000 mg | ORAL_TABLET | Freq: Three times a day (TID) | ORAL | 0 refills | Status: DC | PRN

## 2020-10-16 ENCOUNTER — Other Ambulatory Visit: Payer: Self-pay | Admitting: Internal Medicine

## 2020-10-28 ENCOUNTER — Ambulatory Visit (HOSPITAL_COMMUNITY)
Admission: RE | Admit: 2020-10-28 | Discharge: 2020-10-28 | Disposition: A | Discharge status: Home / Self Care | Payer: 59 | Source: Ambulatory Visit | Attending: Internal Medicine | Admitting: Internal Medicine

## 2020-10-28 ENCOUNTER — Other Ambulatory Visit: Payer: Self-pay

## 2020-10-28 DIAGNOSIS — C3490 Malignant neoplasm of unspecified part of unspecified bronchus or lung: Secondary | ICD-10-CM | POA: Insufficient documentation

## 2020-10-28 MED ORDER — HEPARIN SOD (PORK) LOCK FLUSH 100 UNIT/ML IV SOLN
INTRAVENOUS | Status: AC
  Filled 2020-10-28: qty 5

## 2020-10-28 MED ORDER — IOHEXOL 300 MG/ML  SOLN
100.0000 mL | Freq: Once | INTRAMUSCULAR | Status: AC | PRN
  Administered 2020-10-28: 100 mL via INTRAVENOUS

## 2020-10-28 MED ORDER — HEPARIN SOD (PORK) LOCK FLUSH 100 UNIT/ML IV SOLN
500.0000 [IU] | Freq: Once | INTRAVENOUS | Status: AC
  Administered 2020-10-28: 500 [IU] via INTRAVENOUS

## 2020-10-31 ENCOUNTER — Inpatient Hospital Stay: Payer: 59

## 2020-10-31 ENCOUNTER — Other Ambulatory Visit: Payer: Self-pay | Admitting: Medical Oncology

## 2020-10-31 ENCOUNTER — Inpatient Hospital Stay: Payer: 59 | Attending: Internal Medicine | Admitting: Internal Medicine

## 2020-10-31 ENCOUNTER — Encounter: Payer: Self-pay | Admitting: Internal Medicine

## 2020-10-31 ENCOUNTER — Other Ambulatory Visit: Payer: Self-pay

## 2020-10-31 VITALS — HR 99

## 2020-10-31 VITALS — BP 101/77 | HR 122 | Temp 97.6°F | Resp 18 | Ht 72.0 in | Wt 152.3 lb

## 2020-10-31 DIAGNOSIS — Z5112 Encounter for antineoplastic immunotherapy: Secondary | ICD-10-CM | POA: Diagnosis not present

## 2020-10-31 DIAGNOSIS — C3412 Malignant neoplasm of upper lobe, left bronchus or lung: Secondary | ICD-10-CM | POA: Insufficient documentation

## 2020-10-31 DIAGNOSIS — F418 Other specified anxiety disorders: Secondary | ICD-10-CM | POA: Insufficient documentation

## 2020-10-31 DIAGNOSIS — I10 Essential (primary) hypertension: Secondary | ICD-10-CM | POA: Diagnosis not present

## 2020-10-31 DIAGNOSIS — Z79899 Other long term (current) drug therapy: Secondary | ICD-10-CM | POA: Insufficient documentation

## 2020-10-31 DIAGNOSIS — Z95828 Presence of other vascular implants and grafts: Secondary | ICD-10-CM

## 2020-10-31 DIAGNOSIS — C7931 Secondary malignant neoplasm of brain: Secondary | ICD-10-CM | POA: Insufficient documentation

## 2020-10-31 DIAGNOSIS — Z7982 Long term (current) use of aspirin: Secondary | ICD-10-CM | POA: Insufficient documentation

## 2020-10-31 DIAGNOSIS — Z8042 Family history of malignant neoplasm of prostate: Secondary | ICD-10-CM | POA: Diagnosis not present

## 2020-10-31 LAB — CBC WITH DIFFERENTIAL (CANCER CENTER ONLY)
Abs Immature Granulocytes: 0.07 10*3/uL (ref 0.00–0.07)
Basophils Absolute: 0 10*3/uL (ref 0.0–0.1)
Basophils Relative: 0 %
Eosinophils Absolute: 0.1 10*3/uL (ref 0.0–0.5)
Eosinophils Relative: 1 %
HCT: 28 % — ABNORMAL LOW (ref 39.0–52.0)
Hemoglobin: 9.4 g/dL — ABNORMAL LOW (ref 13.0–17.0)
Immature Granulocytes: 1 %
Lymphocytes Relative: 11 %
Lymphs Abs: 0.9 10*3/uL (ref 0.7–4.0)
MCH: 34.7 pg — ABNORMAL HIGH (ref 26.0–34.0)
MCHC: 33.6 g/dL (ref 30.0–36.0)
MCV: 103.3 fL — ABNORMAL HIGH (ref 80.0–100.0)
Monocytes Absolute: 0.7 10*3/uL (ref 0.1–1.0)
Monocytes Relative: 8 %
Neutro Abs: 7.1 10*3/uL (ref 1.7–7.7)
Neutrophils Relative %: 79 %
Platelet Count: 553 10*3/uL — ABNORMAL HIGH (ref 150–400)
RBC: 2.71 MIL/uL — ABNORMAL LOW (ref 4.22–5.81)
RDW: 13.5 % (ref 11.5–15.5)
WBC Count: 9 10*3/uL (ref 4.0–10.5)
nRBC: 0 % (ref 0.0–0.2)

## 2020-10-31 LAB — CMP (CANCER CENTER ONLY)
ALT: 15 U/L (ref 0–44)
AST: 23 U/L (ref 15–41)
Albumin: 2.8 g/dL — ABNORMAL LOW (ref 3.5–5.0)
Alkaline Phosphatase: 101 U/L (ref 38–126)
Anion gap: 11 (ref 5–15)
BUN: 8 mg/dL (ref 6–20)
CO2: 28 mmol/L (ref 22–32)
Calcium: 9.6 mg/dL (ref 8.9–10.3)
Chloride: 101 mmol/L (ref 98–111)
Creatinine: 0.82 mg/dL (ref 0.61–1.24)
GFR, Estimated: >60 mL/min (ref >60)
Glucose, Bld: 106 mg/dL — ABNORMAL HIGH (ref 70–99)
Potassium: 4.1 mmol/L (ref 3.5–5.1)
Sodium: 140 mmol/L (ref 135–145)
Total Bilirubin: 0.3 mg/dL (ref 0.3–1.2)
Total Protein: 8.1 g/dL (ref 6.5–8.1)

## 2020-10-31 LAB — TSH: TSH: 0.542 u[IU]/mL (ref 0.320–4.118)

## 2020-10-31 MED ORDER — SODIUM CHLORIDE 0.9% FLUSH
10.0000 mL | INTRAVENOUS | Status: DC | PRN
  Administered 2020-10-31: 10 mL
  Filled 2020-10-31: qty 10

## 2020-10-31 MED ORDER — DOXYCYCLINE HYCLATE 100 MG PO TABS: 100.0000 mg | ORAL_TABLET | Freq: Two times a day (BID) | ORAL | 0 refills | Status: DC

## 2020-10-31 MED ORDER — SODIUM CHLORIDE 0.9 % IV SOLN
INTRAVENOUS | Status: DC
  Filled 2020-10-31: qty 250

## 2020-10-31 MED ORDER — HEPARIN SOD (PORK) LOCK FLUSH 100 UNIT/ML IV SOLN
500.0000 [IU] | Freq: Once | INTRAVENOUS | Status: AC | PRN
  Administered 2020-10-31: 500 [IU]
  Filled 2020-10-31: qty 5

## 2020-10-31 MED ORDER — SODIUM CHLORIDE 0.9 % IV SOLN
1500.0000 mg | Freq: Once | INTRAVENOUS | Status: AC
  Administered 2020-10-31: 1500 mg via INTRAVENOUS
  Filled 2020-10-31: qty 30

## 2020-10-31 MED ORDER — SODIUM CHLORIDE 0.9% FLUSH
10.0000 mL | Freq: Once | INTRAVENOUS | Status: AC
  Administered 2020-10-31: 10 mL
  Filled 2020-10-31: qty 10

## 2020-10-31 NOTE — Progress Notes (Signed)
Pt tolerated treatment well.  Stable at discharge.  No complaints.  Ambulatory to lobby

## 2020-10-31 NOTE — Progress Notes (Signed)
[facility] [phone]   [phone]  OFFICE PROGRESS NOTE  [name], MD [place] [address]  DIAGNOSIS: Extensive stage (T2b, N2, M1c) small cell lung cancer presented with left upper lobe pulmonary nodule in addition to left hilar mass with mediastinal invasion and solitary brain metastasis diagnosed in May 2021  PRIOR THERAPY: Palliative radiation to the left hilar and mediastinal lymphadenopathy under the care of Dr. Lisbeth Renshaw.  CURRENT THERAPY:  Palliative systemic chemotherapy with carboplatin for AUC of 5 on day 1, etoposide 100 mg/M2 on days 1, 2 and 3 as well as Imfinzi 1500 mg IV every 3 weeks with the chemotherapy.  First dose of chemotherapy May 06, 2020.  The patient will receive treatment with Cosela on the days of his chemotherapy.  Status post 5 cycles.  Starting from cycle #5 he will be.  He did wear his maintenance immunotherapy with Imfinzi 1500 mg IV every 4 weeks. Status post 2 cycles.   INTERVAL HISTORY: Richard Santos 59 y.o. male returns to the clinic today for follow-up visit.  The patient is feeling fine today with no concerning complaints except for fatigue.  He had few episodes of left-sided chest pain and he was seen by EMS at his house twice but did not go to the hospital.  He has lack of appetite and lost few pounds.  The patient denied having any current chest pain but has shortness of breath with exertion with cough productive of thick mucus and no hemoptysis.  He denied having any fever or chills.  He has no headache or visual changes.  He has been tolerating his treatment with immunotherapy fairly well.  He had repeat CT scan of the chest performed recently and is here for evaluation and discussion of his discuss results.   MEDICAL HISTORY: Past Medical History:  Diagnosis Date  . Allergic rhinitis   . Allergy   . Anxiety   . Chronic low back pain   . DDD (degenerative disc disease), lumbar    . ED (erectile dysfunction)   . Finger injury    cut pads off 3rd and 4th finger left hand/due to lawn mower accident  . GERD (gastroesophageal reflux disease)   . Heart murmur    as child only-   . History of kidney stones   . HTN (hypertension) 08/20/2016  . Hyperlipidemia   . Incomplete right bundle branch block   . Prostate cancer ([facility]) dx 10/02/14   stage T1c  . Prostate cancer ([facility])   . Sigmoid diverticulosis   . Wears glasses     ALLERGIES:  is allergic to sulfur, bee venom, and sulfa antibiotics.  MEDICATIONS:  Current Outpatient Medications  Medication Sig Dispense Refill  . acetaminophen (TYLENOL) 325 MG tablet Take 2 tablets (650 mg total) by mouth every 6 (six) hours as needed for mild pain (or Fever >/= 101). 30 tablet 0  . aspirin EC 81 MG tablet Take 81 mg by mouth daily. Swallow whole.    Marland Kitchen atorvastatin (LIPITOR) 40 MG tablet Take 1 tablet (40 mg total) by mouth daily. 90 tablet 3  . Budeson-Glycopyrrol-Formoterol (BREZTRI AEROSPHERE) 160-9-4.8 MCG/ACT AERO Inhale 2 puffs into the lungs in the morning and at bedtime. 10.7 g 2  . busPIRone (BUSPAR) 10 MG tablet TAKE 1 TABLET BY MOUTH TWICE A DAY 180 tablet 3  . EPINEPHrine (EPIPEN) 0.3 mg/0.3 mL IJ SOAJ injection Inject 0.3 mLs (0.3 mg total)  into the muscle once. (Patient not taking: Reported on 09/05/2020) 2 Device 2  . lidocaine-prilocaine (EMLA) cream Apply 1 application topically as needed. 30 g 2  . meloxicam (MOBIC) 15 MG tablet Take 15 mg by mouth daily.    . memantine (NAMENDA) 10 MG tablet Take 1 tablet (10 mg total) by mouth 2 (two) times daily. 60 tablet 4  . Multiple Vitamin (MULTIVITAMIN) capsule Take 1 capsule by mouth daily.    . nicotine (NICODERM CQ - DOSED IN MG/24 HOURS) 21 mg/24hr patch Place 1 patch (21 mg total) onto the skin daily. (Patient not taking: Reported on 09/05/2020) 28 patch 0  . nicotine (NICODERM CQ) 14 mg/24hr patch Place 1 patch (14 mg total) onto the skin daily. (Patient not  taking: Reported on 09/05/2020) 28 patch 3  . ondansetron (ZOFRAN-ODT) 8 MG disintegrating tablet Take 1 tablet (8 mg total) by mouth every 8 (eight) hours as needed for nausea or vomiting. 20 tablet 0  . pantoprazole (PROTONIX) 40 MG tablet TAKE 1 TABLET BY MOUTH DAILY BEFORE BREAKFAST 90 tablet 3  . traMADol (ULTRAM) 50 MG tablet TAKE HALF TABLET BY MOUTH 2 TIMES DAILY AS NEEDED FOR PAIN (Patient taking differently: Take 25 mg by mouth every 12 (twelve) hours as needed for moderate pain. TAKE HALF TABLET BY MOUTH 2 TIMES DAILY AS NEEDED FOR PAIN) 60 tablet 2  . triamcinolone cream (KENALOG) 0.5 % Apply 1 application topically 3 (three) times daily. 90 g 1  . umeclidinium-vilanterol (ANORO ELLIPTA) 62.5-25 MCG/INH AEPB Inhale 1 puff into the lungs daily. 60 each 2   No current facility-administered medications for this visit.    SURGICAL HISTORY:  Past Surgical History:  Procedure Laterality Date  . BRONCHIAL BIOPSY  04/12/2020   Procedure: BRONCHIAL BIOPSIES;  Surgeon: Marshell Garfinkel, MD;  Location: [facility];  Service: Cardiopulmonary;;  . BRONCHIAL BRUSHINGS  04/12/2020   Procedure: BRONCHIAL BRUSHINGS;  Surgeon: Marshell Garfinkel, MD;  Location: [facility];  Service: Cardiopulmonary;;  . BRONCHIAL NEEDLE ASPIRATION BIOPSY  04/12/2020   Procedure: BRONCHIAL NEEDLE ASPIRATION BIOPSIES;  Surgeon: Marshell Garfinkel, MD;  Location: [facility];  Service: Cardiopulmonary;;  . BRONCHIAL WASHINGS  04/12/2020   Procedure: BRONCHIAL WASHINGS;  Surgeon: Marshell Garfinkel, MD;  Location: [facility];  Service: Cardiopulmonary;;  . COLONOSCOPY    . COLONOSCOPY W/ POLYPECTOMY  04-19-2012  . ENDOBRONCHIAL ULTRASOUND N/A 04/12/2020   Procedure: ENDOBRONCHIAL ULTRASOUND;  Surgeon: Marshell Garfinkel, MD;  Location: [facility];  Service: Cardiopulmonary;  Laterality: N/A;  . ESOPHAGOGASTRODUODENOSCOPY  08-05-2010  . HEMOSTASIS CONTROL  04/12/2020   Procedure: HEMOSTASIS CONTROL;  Surgeon: Marshell Garfinkel,  MD;  Location: [facility];  Service: Cardiopulmonary;;  . IR IMAGING GUIDED PORT INSERTION  05/31/2020  . POLYPECTOMY    . PROSTATE BIOPSY  10/02/14  . RADIOACTIVE SEED IMPLANT N/A 01/30/2015   Procedure: RADIOACTIVE SEED IMPLANT;  Surgeon: Rana Snare, MD;  Location: [facility];  Service: Urology;  Laterality: N/A;  . UPPER GASTROINTESTINAL ENDOSCOPY    . VIDEO BRONCHOSCOPY N/A 04/12/2020   Procedure: VIDEO BRONCHOSCOPY WITHOUT FLUORO;  Surgeon: Marshell Garfinkel, MD;  Location: [facility];  Service: Cardiopulmonary;  Laterality: N/A;    REVIEW OF SYSTEMS:  Constitutional: positive for anorexia, fatigue and weight loss Eyes: negative Ears, nose, mouth, throat, and face: negative Respiratory: positive for cough, dyspnea on exertion and sputum Cardiovascular: negative Gastrointestinal: negative Genitourinary:negative Integument/breast: negative Hematologic/lymphatic: negative Musculoskeletal:negative Neurological: negative Behavioral/Psych: negative Endocrine: negative Allergic/Immunologic: negative   PHYSICAL EXAMINATION: General appearance: alert,  cooperative, fatigued and no distress Head: Normocephalic, without obvious abnormality, atraumatic Neck: no adenopathy, no JVD, supple, symmetrical, trachea midline and thyroid not enlarged, symmetric, no tenderness/mass/nodules Lymph nodes: Cervical, supraclavicular, and axillary nodes normal. Resp: clear to auscultation bilaterally Back: symmetric, no curvature. ROM normal. No CVA tenderness. Cardio: regular rate and rhythm, S1, S2 normal, no murmur, click, rub or gallop GI: soft, non-tender; bowel sounds normal; no masses,  no organomegaly Extremities: extremities normal, atraumatic, no cyanosis or edema Neurologic: Alert and oriented X 3, normal strength and tone. Normal symmetric reflexes. Normal coordination and gait  ECOG PERFORMANCE STATUS: 1 - Symptomatic but completely ambulatory  Blood pressure 101/77, pulse  (!) 122, temperature 97.6 F (36.4 C), temperature source Tympanic, resp. rate 18, height 6' (1.829 m), weight 152 lb 4.8 oz (69.1 kg), SpO2 100 %.  LABORATORY DATA: Lab Results  Component Value Date   WBC 12.3 (H) 10/02/2020   HGB 10.4 (L) 10/02/2020   HCT 29.4 (L) 10/02/2020   MCV 102.8 (H) 10/02/2020   PLT 141 (L) 10/02/2020      Chemistry      Component Value Date/Time   NA 134 (L) 10/02/2020 1145   K 3.3 (L) 10/02/2020 1145   CL 98 10/02/2020 1145   CO2 27 10/02/2020 1145   BUN 18 10/02/2020 1145   CREATININE 1.14 10/02/2020 1145      Component Value Date/Time   CALCIUM 8.9 10/02/2020 1145   ALKPHOS 67 10/02/2020 1145   AST 27 10/02/2020 1145   ALT 27 10/02/2020 1145   BILITOT 0.9 10/02/2020 1145       RADIOGRAPHIC STUDIES: CT Chest W Contrast  Result Date: 10/29/2020 CLINICAL DATA:  Primary Cancer Type: Lung Imaging Indication: Assess response to therapy Interval therapy since last imaging? Yes Initial Cancer Diagnosis Date: 04/12/2020; Established by: Biopsy-proven Detailed Pathology: Extensive stage small cell lung cancer. Primary Tumor location: Left hilum and left upper lobe. Solitary brain metastasis. Surgeries: No. Chemotherapy: Yes; Ongoing? Yes; Most recent administration: 10/02/2020 Immunotherapy?  Yes; Type: Imfinzi; Ongoing? Yes Radiation therapy? Yes Date Range: 08/26/2020 - 09/06/2020; Target: Prophylactic Cranial Irradiation. Date Range: 04/25/2020-05/15/2020; Target: Left lung. Other Cancer Therapies: Prostate cancer with brachytherapy seeds 2016. EXAM: CT CHEST, ABDOMEN, AND PELVIS WITH CONTRAST TECHNIQUE: Multidetector CT imaging of the chest, abdomen and pelvis was performed following the standard protocol during bolus administration of intravenous contrast. CONTRAST:  123mL OMNIPAQUE IOHEXOL 300 MG/ML SOLN, additional oral enteric contrast COMPARISON:  Most recent CT chest 08/05/2020. CT abdomen and pelvis 06/27/2020. FINDINGS: CT CHEST FINDINGS  Cardiovascular: Right chest port catheter. Aortic atherosclerosis. Normal heart size. Three-vessel coronary artery calcifications. No pericardial effusion. Mediastinum/Nodes: Unchanged post treatment appearance of soft tissue thickening about the left hilum (series 2, image 27). No discretely enlarged mediastinal, hilar, or axillary lymph nodes. Thyroid gland, trachea, and esophagus demonstrate no significant findings. Lungs/Pleura: Interval decrease in size of a lobulated nodule of the peripheral left upper lobe measuring 2.4 x 1.4 cm, previously 2.8 x 1.6 cm (series 4, image 40). There is extensive new heterogeneous and consolidative airspace opacity of the lingula and left lower lobe inferior to this nodule, with a large thick-walled cavitary lesion of the lingula measuring approximately 4.3 x 3.8 cm (series 4, image 67). No pleural effusion or pneumothorax. Musculoskeletal: No chest wall mass or suspicious bone lesions identified. CT ABDOMEN PELVIS FINDINGS Hepatobiliary: No solid liver abnormality is seen. No gallstones, gallbladder wall thickening, or biliary dilatation. Pancreas: Unremarkable. No pancreatic ductal dilatation or surrounding inflammatory  changes. Spleen: Normal in size without significant abnormality. Adrenals/Urinary Tract: Adrenal glands are unremarkable. Small nonobstructive left renal calculus. No right-sided calculi or hydronephrosis. Bladder is unremarkable. Stomach/Bowel: Stomach is within normal limits. Appendix appears normal. No evidence of bowel wall thickening, distention, or inflammatory changes. Occasional sigmoid diverticula. Vascular/Lymphatic: Aortic atherosclerosis. No enlarged abdominal or pelvic lymph nodes. Reproductive: Prostate brachytherapy seeds. Other: No abdominal wall hernia or abnormality. No abdominopelvic ascites. Musculoskeletal: No acute or significant osseous findings. IMPRESSION: 1. Interval decrease in size of lobulated nodule of the peripheral left upper  lobe, consistent with treatment response of primary lung malignancy. 2. Extensive new heterogeneous and consolidative airspace opacity of the lingula and left lower lobe, with a large, thick-walled cavitary lesion of the lingula. This appearance, particularly the presence of a cavitary lesion, favors infection rather than radiation pneumonitis or other treatment related side effects, and rapid development compared to prior examination dated 09/13 does not favor progression of malignancy. 3.  Unchanged post treatment appearance of the left hilum. 4.  No evidence of metastatic disease in the abdomen or pelvis. 5.  Nonobstructive left nephrolithiasis. 6.  Prostate brachytherapy. 7.  Coronary artery disease. Aortic Atherosclerosis (ICD10-I70.0). Electronically Signed   By: Eddie Candle M.D.   On: 10/29/2020 10:10   CT Abdomen Pelvis W Contrast  Result Date: 10/29/2020 CLINICAL DATA:  Primary Cancer Type: Lung Imaging Indication: Assess response to therapy Interval therapy since last imaging? Yes Initial Cancer Diagnosis Date: 04/12/2020; Established by: Biopsy-proven Detailed Pathology: Extensive stage small cell lung cancer. Primary Tumor location: Left hilum and left upper lobe. Solitary brain metastasis. Surgeries: No. Chemotherapy: Yes; Ongoing? Yes; Most recent administration: 10/02/2020 Immunotherapy?  Yes; Type: Imfinzi; Ongoing? Yes Radiation therapy? Yes Date Range: 08/26/2020 - 09/06/2020; Target: Prophylactic Cranial Irradiation. Date Range: 04/25/2020-05/15/2020; Target: Left lung. Other Cancer Therapies: Prostate cancer with brachytherapy seeds 2016. EXAM: CT CHEST, ABDOMEN, AND PELVIS WITH CONTRAST TECHNIQUE: Multidetector CT imaging of the chest, abdomen and pelvis was performed following the standard protocol during bolus administration of intravenous contrast. CONTRAST:  144mL OMNIPAQUE IOHEXOL 300 MG/ML SOLN, additional oral enteric contrast COMPARISON:  Most recent CT chest 08/05/2020. CT abdomen  and pelvis 06/27/2020. FINDINGS: CT CHEST FINDINGS Cardiovascular: Right chest port catheter. Aortic atherosclerosis. Normal heart size. Three-vessel coronary artery calcifications. No pericardial effusion. Mediastinum/Nodes: Unchanged post treatment appearance of soft tissue thickening about the left hilum (series 2, image 27). No discretely enlarged mediastinal, hilar, or axillary lymph nodes. Thyroid gland, trachea, and esophagus demonstrate no significant findings. Lungs/Pleura: Interval decrease in size of a lobulated nodule of the peripheral left upper lobe measuring 2.4 x 1.4 cm, previously 2.8 x 1.6 cm (series 4, image 40). There is extensive new heterogeneous and consolidative airspace opacity of the lingula and left lower lobe inferior to this nodule, with a large thick-walled cavitary lesion of the lingula measuring approximately 4.3 x 3.8 cm (series 4, image 67). No pleural effusion or pneumothorax. Musculoskeletal: No chest wall mass or suspicious bone lesions identified. CT ABDOMEN PELVIS FINDINGS Hepatobiliary: No solid liver abnormality is seen. No gallstones, gallbladder wall thickening, or biliary dilatation. Pancreas: Unremarkable. No pancreatic ductal dilatation or surrounding inflammatory changes. Spleen: Normal in size without significant abnormality. Adrenals/Urinary Tract: Adrenal glands are unremarkable. Small nonobstructive left renal calculus. No right-sided calculi or hydronephrosis. Bladder is unremarkable. Stomach/Bowel: Stomach is within normal limits. Appendix appears normal. No evidence of bowel wall thickening, distention, or inflammatory changes. Occasional sigmoid diverticula. Vascular/Lymphatic: Aortic atherosclerosis. No enlarged abdominal or pelvic lymph nodes.  Reproductive: Prostate brachytherapy seeds. Other: No abdominal wall hernia or abnormality. No abdominopelvic ascites. Musculoskeletal: No acute or significant osseous findings. IMPRESSION: 1. Interval decrease in size  of lobulated nodule of the peripheral left upper lobe, consistent with treatment response of primary lung malignancy. 2. Extensive new heterogeneous and consolidative airspace opacity of the lingula and left lower lobe, with a large, thick-walled cavitary lesion of the lingula. This appearance, particularly the presence of a cavitary lesion, favors infection rather than radiation pneumonitis or other treatment related side effects, and rapid development compared to prior examination dated 09/13 does not favor progression of malignancy. 3.  Unchanged post treatment appearance of the left hilum. 4.  No evidence of metastatic disease in the abdomen or pelvis. 5.  Nonobstructive left nephrolithiasis. 6.  Prostate brachytherapy. 7.  Coronary artery disease. Aortic Atherosclerosis (ICD10-I70.0). Electronically Signed   By: Eddie Candle M.D.   On: 10/29/2020 10:10    ASSESSMENT AND PLAN: This is a very pleasant 59 years old white male recently diagnosed with extensive stage small cell lung cancer presented with left upper lobe lung mass in addition to left hilar and mediastinal lymphadenopathy as well as solitary brain metastasis diagnosed in May 2021. He underwent palliative radiotherapy to the left hilar and mediastinal lymphadenopathy in addition to the radiotherapy to the brain metastasis.  He continues to have residual odynophagia and dysphagia from the palliative radiotherapy. He is currently undergoing systemic chemotherapy with carboplatin for AUC of 5 on day 1, etoposide 100 mg/M2 on days 1, 2 and 3 with Cosela infusion before chemotherapy.  He is status post 5 cycles of treatment  Starting from cycle #5 the patient is on treatment with maintenance Imfinzi every 4 weeks status post 2 cycles.  The patient continues to tolerate his treatment with immunotherapy fairly well except for the lack of appetite and fatigue. He had repeat CT scan of the chest, abdomen pelvis performed recently.  I personally and  independently reviewed the scans and discussed the results with the patient and his wife today. His scan showed further improvement in the size of the lobulated nodule of the peripheral left upper lobe consistent with treatment response.  There was extensive new heterogeneous and consolidative airspace opacity of the lingula and left lower lobe suspicious for infectious rather than radiation pneumonitis. I recommended for the patient to continue his current treatment with Imfinzi and he will proceed with cycle #3 of the maintenance therapy today. For the suspicious inflammatory process in the left lung, I will start the patient empirically on doxycycline 100 mg p.o. twice daily for 2 weeks. The patient will come back for follow-up visit in 4 weeks for evaluation before the next cycle of his treatment. He was advised to call immediately if he has any other concerning symptoms in the interval. The patient voices understanding of current disease status and treatment options and is in agreement with the current care plan.  All questions were answered. The patient knows to call the clinic with any problems, questions or concerns. We can certainly see the patient much sooner if necessary.   Disclaimer: This note was dictated with voice recognition software. Similar sounding words can inadvertently be transcribed and may not be corrected upon review.

## 2020-10-31 NOTE — Patient Instructions (Signed)
[facility] Discharge Instructions for Patients Receiving Chemotherapy  Today you received the following chemotherapy agent: Imfinzi  To help prevent nausea and vomiting after your treatment, we encourage you to take your nausea medication as directed.   If you develop nausea and vomiting that is not controlled by your nausea medication, call the clinic.   BELOW ARE SYMPTOMS THAT SHOULD BE REPORTED IMMEDIATELY:  *FEVER GREATER THAN 100.5 F  *CHILLS WITH OR WITHOUT FEVER  NAUSEA AND VOMITING THAT IS NOT CONTROLLED WITH YOUR NAUSEA MEDICATION  *UNUSUAL SHORTNESS OF BREATH  *UNUSUAL BRUISING OR BLEEDING  TENDERNESS IN MOUTH AND THROAT WITH OR WITHOUT PRESENCE OF ULCERS  *URINARY PROBLEMS  *BOWEL PROBLEMS  UNUSUAL RASH Items with * indicate a potential emergency and should be followed up as soon as possible.  Feel free to call the clinic should you have any questions or concerns. The clinic phone number is [phone].  Please show the [facility] at check-in to the Emergency Department and triage nurse.

## 2020-10-31 NOTE — Progress Notes (Signed)
Per Dr Julien Nordmann it is okay to treat pt today with imfinzi and heart rate of 120/min.

## 2020-10-31 NOTE — Patient Instructions (Signed)
Steps to Quit Smoking Smoking tobacco is the leading cause of preventable death. It can affect almost every organ in the body. Smoking puts you and people around you at risk for many serious, long-lasting (chronic) diseases. Quitting smoking can be hard, but it is one of the best things that you can do for your health. It is never too late to quit. How do I get ready to quit? When you decide to quit smoking, make a plan to help you succeed. Before you quit:  Pick a date to quit. Set a date within the next 2 weeks to give you time to prepare.  Write down the reasons why you are quitting. Keep this list in places where you will see it often.  Tell your family, friends, and co-workers that you are quitting. Their support is important.  Talk with your doctor about the choices that may help you quit.  Find out if your health insurance will pay for these treatments.  Know the people, places, things, and activities that make you want to smoke (triggers). Avoid them. What first steps can I take to quit smoking?  Throw away all cigarettes at home, at work, and in your car.  Throw away the things that you use when you smoke, such as ashtrays and lighters.  Clean your car. Make sure to empty the ashtray.  Clean your home, including curtains and carpets. What can I do to help me quit smoking? Talk with your doctor about taking medicines and seeing a counselor at the same time. You are more likely to succeed when you do both.  If you are pregnant or breastfeeding, talk with your doctor about counseling or other ways to quit smoking. Do not take medicine to help you quit smoking unless your doctor tells you to do so. To quit smoking: Quit right away  Quit smoking totally, instead of slowly cutting back on how much you smoke over a period of time.  Go to counseling. You are more likely to quit if you go to counseling sessions regularly. Take medicine You may take medicines to help you quit. Some  medicines need a prescription, and some you can buy over-the-counter. Some medicines may contain a drug called nicotine to replace the nicotine in cigarettes. Medicines may:  Help you to stop having the desire to smoke (cravings).  Help to stop the problems that come when you stop smoking (withdrawal symptoms). Your doctor may ask you to use:  Nicotine patches, gum, or lozenges.  Nicotine inhalers or sprays.  Non-nicotine medicine that is taken by mouth. Find resources Find resources and other ways to help you quit smoking and remain smoke-free after you quit. These resources are most helpful when you use them often. They include:  Online chats with a counselor.  Phone quitlines.  Printed self-help materials.  Support groups or group counseling.  Text messaging programs.  Mobile phone apps. Use apps on your mobile phone or tablet that can help you stick to your quit plan. There are many free apps for mobile phones and tablets as well as websites. Examples include Quit Guide from the CDC and smokefree.gov  What things can I do to make it easier to quit?   Talk to your family and friends. Ask them to support and encourage you.  Call a phone quitline (1-800-QUIT-NOW), reach out to support groups, or work with a counselor.  Ask people who smoke to not smoke around you.  Avoid places that make you want to smoke,   such as: ? Bars. ? Parties. ? Smoke-break areas at work.  Spend time with people who do not smoke.  Lower the stress in your life. Stress can make you want to smoke. Try these things to help your stress: ? Getting regular exercise. ? Doing deep-breathing exercises. ? Doing yoga. ? Meditating. ? Doing a body scan. To do this, close your eyes, focus on one area of your body at a time from head to toe. Notice which parts of your body are tense. Try to relax the muscles in those areas. How will I feel when I quit smoking? Day 1 to 3 weeks Within the first 24 hours,  you may start to have some problems that come from quitting tobacco. These problems are very bad 2-3 days after you quit, but they do not often last for more than 2-3 weeks. You may get these symptoms:  Mood swings.  Feeling restless, nervous, angry, or annoyed.  Trouble concentrating.  Dizziness.  Strong desire for high-sugar foods and nicotine.  Weight gain.  Trouble pooping (constipation).  Feeling like you may vomit (nausea).  Coughing or a sore throat.  Changes in how the medicines that you take for other issues work in your body.  Depression.  Trouble sleeping (insomnia). Week 3 and afterward After the first 2-3 weeks of quitting, you may start to notice more positive results, such as:  Better sense of smell and taste.  Less coughing and sore throat.  Slower heart rate.  Lower blood pressure.  Clearer skin.  Better breathing.  Fewer sick days. Quitting smoking can be hard. Do not give up if you fail the first time. Some people need to try a few times before they succeed. Do your best to stick to your quit plan, and talk with your doctor if you have any questions or concerns. Summary  Smoking tobacco is the leading cause of preventable death. Quitting smoking can be hard, but it is one of the best things that you can do for your health.  When you decide to quit smoking, make a plan to help you succeed.  Quit smoking right away, not slowly over a period of time.  When you start quitting, seek help from your doctor, family, or friends. This information is not intended to replace advice given to you by your health care provider. Make sure you discuss any questions you have with your health care provider. Document Revised: 08/04/2019 Document Reviewed: 01/28/2019 Elsevier Patient Education  2020 Elsevier Inc.  

## 2020-11-03 NOTE — Progress Notes (Signed)
  Radiation Oncology         [phone] ________________________________  Name: CECILIA NISHIKAWA MRN: [id number]  Date: 09/06/2020  DOB: 01/12/1961  End of Treatment Note  Diagnosis:   SCLC  Indication for treatment::  palliative       Radiation treatment dates:   08/26/20 - 09/06/20  Site/dose:   The patient was treated with a course of whole brain radiation therapy to a dose of 30 Gy in 10 fractions.  This was accomplished using a 2 field technique with additional reduced fields as necessary to improve dose homogeneity.  Narrative: The patient tolerated radiation treatment relatively well.   No unexpected difficulties in terms of acute toxicity.  The skin held up to treatment fairly well.  Plan: The patient has completed radiation treatment. The patient will return to radiation oncology clinic for routine followup in one month. I advised the patient to call or return sooner if they have any questions or concerns related to their recovery or treatment. ________________________________  Jodelle Gross, M.D., Ph.D.

## 2020-11-04 ENCOUNTER — Other Ambulatory Visit: Payer: Self-pay | Admitting: Internal Medicine

## 2020-11-05 ENCOUNTER — Encounter: Payer: Self-pay | Admitting: Internal Medicine

## 2020-11-05 ENCOUNTER — Ambulatory Visit (INDEPENDENT_AMBULATORY_CARE_PROVIDER_SITE_OTHER): Payer: 59 | Admitting: Internal Medicine

## 2020-11-05 ENCOUNTER — Other Ambulatory Visit: Payer: Self-pay

## 2020-11-05 VITALS — BP 90/68 | HR 92 | Temp 99.1°F | Ht 72.0 in | Wt 155.0 lb

## 2020-11-05 DIAGNOSIS — Z0000 Encounter for general adult medical examination without abnormal findings: Secondary | ICD-10-CM

## 2020-11-05 DIAGNOSIS — R7302 Impaired glucose tolerance (oral): Secondary | ICD-10-CM | POA: Diagnosis not present

## 2020-11-05 DIAGNOSIS — E785 Hyperlipidemia, unspecified: Secondary | ICD-10-CM | POA: Diagnosis not present

## 2020-11-05 NOTE — Progress Notes (Deleted)
   Subjective:    Patient ID: Richard Santos, male    DOB: 05-15-1961, 59 y.o.   MRN: [id number]  HPI  S/p immunotx dec 9, now on doxycycline for possible infectious issue on CT on dec 6; Also for MRI brain very soon to f/u knon brain metastasis sp xrt.    Review of Systems     Objective:   Physical Exam        Assessment & Plan:

## 2020-11-05 NOTE — Patient Instructions (Signed)
Please continue all other medications as before, and refills have been done if requested. ° °Please have the pharmacy call with any other refills you may need. ° °Please continue your efforts at being more active, low cholesterol diet, and weight control. ° °You are otherwise up to date with prevention measures today. ° °Please keep your appointments with your specialists as you may have planned ° °Please make an Appointment to return in 6 months, or sooner if needed °

## 2020-11-06 ENCOUNTER — Telehealth: Payer: Self-pay

## 2020-11-06 ENCOUNTER — Other Ambulatory Visit: Payer: Self-pay

## 2020-11-06 ENCOUNTER — Encounter: Payer: Self-pay | Admitting: Radiation Oncology

## 2020-11-06 NOTE — Telephone Encounter (Signed)
Spoke with patient in regards to follow-up appointment with Shona Simpson PA on 11/12/20 @ 9:30am. Patient verbalized understanding of appointment date and time. Reviewed meaningful use questions TM

## 2020-11-07 ENCOUNTER — Ambulatory Visit (HOSPITAL_COMMUNITY)
Admission: RE | Admit: 2020-11-07 | Discharge: 2020-11-07 | Disposition: A | Discharge status: Home / Self Care | Payer: 59 | Source: Ambulatory Visit | Attending: Radiation Oncology | Admitting: Radiation Oncology

## 2020-11-07 ENCOUNTER — Other Ambulatory Visit: Payer: Self-pay

## 2020-11-07 DIAGNOSIS — C7931 Secondary malignant neoplasm of brain: Secondary | ICD-10-CM | POA: Insufficient documentation

## 2020-11-07 MED ORDER — GADOBUTROL 1 MMOL/ML IV SOLN
7.0000 mL | Freq: Once | INTRAVENOUS | Status: AC | PRN
  Administered 2020-11-07: 7 mL via INTRAVENOUS

## 2020-11-07 MED ORDER — HEPARIN SOD (PORK) LOCK FLUSH 100 UNIT/ML IV SOLN
500.0000 [IU] | INTRAVENOUS | Status: AC | PRN
  Administered 2020-11-07: 500 [IU]
  Filled 2020-11-07: qty 5

## 2020-11-08 ENCOUNTER — Telehealth: Payer: Self-pay | Admitting: Radiation Therapy

## 2020-11-08 NOTE — Telephone Encounter (Signed)
Called to share the recent brain MRI results with Richard Santos. He was happy with the results and thankful for the call.   Mont Dutton R.T.(R)(T) Radiation Special Procedures Navigator

## 2020-11-11 ENCOUNTER — Inpatient Hospital Stay: Payer: 59

## 2020-11-12 ENCOUNTER — Ambulatory Visit
Admission: RE | Admit: 2020-11-12 | Discharge: 2020-11-12 | Disposition: A | Discharge status: Home / Self Care | Payer: 59 | Source: Ambulatory Visit | Attending: Radiation Oncology | Admitting: Radiation Oncology

## 2020-11-12 DIAGNOSIS — C3412 Malignant neoplasm of upper lobe, left bronchus or lung: Secondary | ICD-10-CM

## 2020-11-12 DIAGNOSIS — C7931 Secondary malignant neoplasm of brain: Secondary | ICD-10-CM

## 2020-11-12 NOTE — Progress Notes (Addendum)
Radiation Oncology         [phone] ________________________________  Outpatient Follow Up - Conducted via telephone due to current COVID-19 concerns for limiting patient exposure  I spoke with the patient to conduct this consult visit via telephone to spare the patient unnecessary potential exposure in the healthcare setting during the current COVID-19 pandemic. The patient was notified in advance and was offered a [place] meeting to allow for face to face communication but unfortunately reported that they did not have the appropriate resources/technology to support such a visit and instead preferred to proceed with a telephone visit.  Name: Richard Santos        MRN: [id number]  Date of Service: 11/12/2020 DOB: [birth year]  VV:OHYW, Hunt Oris, MD  Curt Bears, MD     REFERRING PHYSICIAN: Curt Bears, MD   DIAGNOSIS: The primary encounter diagnosis was Metastasis to brain [facility]). Diagnoses of Small cell lung cancer, left upper lobe ([place]) and Metastatic cancer to brain [facility]) were also pertinent to this visit.   HISTORY OF PRESENT ILLNESS: Richard Santos is a 59 y.o. male  with a history of extensive stage small cell lung cancer.  The patient had a history of prostate cancer that was treated in 2016 with radioactive seed placement.  He was diagnosed with small cell carcinoma in May 2021 and received chemoradiation in the summer 2021, during his work-up he did have a 6 mm lesion in the brain along the cerebellum, however with repeat imaging on 08/03/2020, this was felt to be rather a subacute infarct and measured 4 mm.  He was counseled following the course of chemoradiation on prophylactic cranial irradiation and received this regimen in October 2021.  His first posttreatment MRI on 11/07/2020 shows posttreatment changes with susceptibility artifact in the left inferior cerebellar hemisphere without abnormal contrast-enhancement and no new abnormalities or contrast-enhancement.  He is  contacted today to review these results.  PREVIOUS RADIATION THERAPY: Yes   08/26/20-09/06/20 Prophylactic Cranial Irradiation (PCI)Treatment: The patient's whole brain was treated to 25 Gy in 10 fractions   04/25/20 - 05/15/20: The patient was treated to the disease within the leftlung initially to a dose of 37.5Gy in 15 fractionsusing a59field, 3-D conformal technique.   2016:  The prostate was treated with radioactive seeds with the following prescription: Prostate 14,500 cGy.Isotope: I-125 utilizing 79 seeds and 29 active needles. Individual seed activity 0.47 mCi per seed for a total implant activity of 37.13 mCi.  PAST MEDICAL HISTORY:  Past Medical History:  Diagnosis Date  . Allergic rhinitis   . Allergy   . Anxiety   . Chronic low back pain   . DDD (degenerative disc disease), lumbar   . ED (erectile dysfunction)   . Finger injury    cut pads off 3rd and 4th finger left hand/due to lawn mower accident  . GERD (gastroesophageal reflux disease)   . Heart murmur    as child only-   . History of kidney stones   . HTN (hypertension) 08/20/2016  . Hyperlipidemia   . Incomplete right bundle branch block   . Prostate cancer ([place]) dx 10/02/14   stage T1c  . Prostate cancer ([place])   . Sigmoid diverticulosis   . Wears glasses        PAST SURGICAL HISTORY: Past Surgical History:  Procedure Laterality Date  . BRONCHIAL BIOPSY  04/12/2020   Procedure: BRONCHIAL BIOPSIES;  Surgeon: Marshell Garfinkel, MD;  Location: [facility];  Service: Cardiopulmonary;;  . BRONCHIAL BRUSHINGS  04/12/2020   Procedure: BRONCHIAL BRUSHINGS;  Surgeon: Marshell Garfinkel, MD;  Location: [place];  Service: Cardiopulmonary;;  . BRONCHIAL NEEDLE ASPIRATION BIOPSY  04/12/2020   Procedure: BRONCHIAL NEEDLE ASPIRATION BIOPSIES;  Surgeon: Marshell Garfinkel, MD;  Location: [place];  Service: Cardiopulmonary;;  . BRONCHIAL WASHINGS  04/12/2020   Procedure: BRONCHIAL WASHINGS;  Surgeon: Marshell Garfinkel, MD;  Location: [place] [facility];  Service: Cardiopulmonary;;  . COLONOSCOPY    . COLONOSCOPY W/ POLYPECTOMY  04-19-2012  . ENDOBRONCHIAL ULTRASOUND N/A 04/12/2020   Procedure: ENDOBRONCHIAL ULTRASOUND;  Surgeon: Marshell Garfinkel, MD;  Location: [place];  Service: Cardiopulmonary;  Laterality: N/A;  . ESOPHAGOGASTRODUODENOSCOPY  08-05-2010  . HEMOSTASIS CONTROL  04/12/2020   Procedure: HEMOSTASIS CONTROL;  Surgeon: Marshell Garfinkel, MD;  Location: [place];  Service: Cardiopulmonary;;  . IR IMAGING GUIDED PORT INSERTION  05/31/2020  . POLYPECTOMY    . PROSTATE BIOPSY  10/02/14  . RADIOACTIVE SEED IMPLANT N/A 01/30/2015   Procedure: RADIOACTIVE SEED IMPLANT;  Surgeon: Rana Snare, MD;  Location: [facility];  Service: Urology;  Laterality: N/A;  . UPPER GASTROINTESTINAL ENDOSCOPY    . VIDEO BRONCHOSCOPY N/A 04/12/2020   Procedure: VIDEO BRONCHOSCOPY WITHOUT FLUORO;  Surgeon: Marshell Garfinkel, MD;  Location: [place] [facility]COPY;  Service: Cardiopulmonary;  Laterality: N/A;     FAMILY HISTORY:  Family History  Problem Relation Age of Onset  . Prostate cancer Father   . Heart disease Father   . Heart disease Mother   . Breast cancer Sister   . Heart disease Other   . Colon cancer Neg Hx   . Colon polyps Neg Hx   . Esophageal cancer Neg Hx   . Rectal cancer Neg Hx   . Stomach cancer Neg Hx      SOCIAL HISTORY:  reports that he has been smoking cigarettes. He has a 25.00 pack-year smoking history. He has never used smokeless tobacco. He reports current alcohol use of about 49.0 standard drinks of alcohol per week. He reports that he does not use drugs. The patient is married and lives in [place]. 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   ALLERGIES: Bee venom, Sulfur, Sulfa antibiotics, and Namenda [memantine]   MEDICATIONS:  Current Outpatient Medications  Medication Sig Dispense Refill  . acetaminophen (TYLENOL) 325 MG tablet Take 2 tablets (650 mg  total) by mouth every 6 (six) hours as needed for mild pain (or Fever >/= 101). 30 tablet 0  . aspirin EC 81 MG tablet Take 81 mg by mouth daily. Swallow whole.    Marland Kitchen atorvastatin (LIPITOR) 40 MG tablet Take 1 tablet (40 mg total) by mouth daily. 90 tablet 3  . Budeson-Glycopyrrol-Formoterol (BREZTRI AEROSPHERE) 160-9-4.8 MCG/ACT AERO Inhale 2 puffs into the lungs in the morning and at bedtime. 10.7 g 2  . busPIRone (BUSPAR) 10 MG tablet TAKE 1 TABLET BY MOUTH TWICE A DAY 180 tablet 3  . doxycycline (VIBRA-TABS) 100 MG tablet Take 1 tablet (100 mg total) by mouth 2 (two) times daily. 30 tablet 0  . lidocaine-prilocaine (EMLA) cream Apply 1 application topically as needed. 30 g 2  . meloxicam (MOBIC) 15 MG tablet Take 15 mg by mouth daily.    . Multiple Vitamin (MULTIVITAMIN) capsule Take 1 capsule by mouth daily.    . ondansetron (ZOFRAN-ODT) 8 MG disintegrating tablet TAKE 1 TABLET BY MOUTH EVERY 8 HOURS AS NEEDED FOR NAUSEA OR VOMITING. 18 tablet 1  . pantoprazole (PROTONIX) 40 MG tablet TAKE 1 TABLET BY MOUTH  DAILY BEFORE BREAKFAST 90 tablet 3  . traMADol (ULTRAM) 50 MG tablet TAKE HALF TABLET BY MOUTH 2 TIMES DAILY AS NEEDED FOR PAIN (Patient taking differently: Take 25 mg by mouth every 12 (twelve) hours as needed for moderate pain. TAKE HALF TABLET BY MOUTH 2 TIMES DAILY AS NEEDED FOR PAIN) 60 tablet 2  . triamcinolone cream (KENALOG) 0.5 % Apply 1 application topically 3 (three) times daily. 90 g 1  . umeclidinium-vilanterol (ANORO ELLIPTA) 62.5-25 MCG/INH AEPB Inhale 1 puff into the lungs daily. 60 each 2   No current facility-administered medications for this encounter.     REVIEW OF SYSTEMS: On review of systems, the patient reports that he is doing well overall.  He feels that he is doing very well, he denies any headaches or visual changes.  He is not experiencing any speech difficulties.  He does have some nausea related to his systemic therapy, but states this has improved since  discontinuing Namenda as he had had some nausea related to this as well.  No other complaints are verbalized.   PHYSICAL EXAM:  Wt Readings from Last 3 Encounters:  11/05/20 155 lb (70.3 kg)  10/31/20 152 lb 4.8 oz (69.1 kg)  10/02/20 161 lb 1.6 oz (73.1 kg)   Unable to assess due to encounter type.   ECOG = 0  0 - Asymptomatic (Fully active, able to carry on all predisease activities without restriction)  1 - Symptomatic but completely ambulatory (Restricted in physically strenuous activity but ambulatory and able to carry out work of a light or sedentary nature. For example, light housework, office work)  2 - Symptomatic, <50% in bed during the day (Ambulatory and capable of all self care but unable to carry out any work activities. Up and about more than 50% of waking hours)  3 - Symptomatic, >50% in bed, but not bedbound (Capable of only limited self-care, confined to bed or chair 50% or more of waking hours)  4 - Bedbound (Completely disabled. Cannot carry on any self-care. Totally confined to bed or chair)  5 - Death   Eustace Pen MM, Creech RH, Tormey DC, et al. [phone]). "Toxicity and response criteria of the [facility]". [place] Oncol. 5 (6): 649-55    LABORATORY DATA:  Lab Results  Component Value Date   WBC 9.0 10/31/2020   HGB 9.4 (L) 10/31/2020   HCT 28.0 (L) 10/31/2020   MCV 103.3 (H) 10/31/2020   PLT 553 (H) 10/31/2020   Lab Results  Component Value Date   NA 140 10/31/2020   K 4.1 10/31/2020   CL 101 10/31/2020   CO2 28 10/31/2020   Lab Results  Component Value Date   ALT 15 10/31/2020   AST 23 10/31/2020   ALKPHOS 101 10/31/2020   BILITOT 0.3 10/31/2020      RADIOGRAPHY: CT Chest W Contrast  Result Date: 10/29/2020 CLINICAL DATA:  Primary Cancer Type: Lung Imaging Indication: Assess response to therapy Interval therapy since last imaging? Yes Initial Cancer Diagnosis Date: 04/12/2020; Established by: Biopsy-proven Detailed  Pathology: Extensive stage small cell lung cancer. Primary Tumor location: Left hilum and left upper lobe. Solitary brain metastasis. Surgeries: No. Chemotherapy: Yes; Ongoing? Yes; Most recent administration: 10/02/2020 Immunotherapy?  Yes; Type: Imfinzi; Ongoing? Yes Radiation therapy? Yes Date Range: 08/26/2020 - 09/06/2020; Target: Prophylactic Cranial Irradiation. Date Range: 04/25/2020-05/15/2020; Target: Left lung. Other Cancer Therapies: Prostate cancer with brachytherapy seeds 2016. EXAM: CT CHEST, ABDOMEN, AND PELVIS WITH CONTRAST TECHNIQUE: Multidetector CT  imaging of the chest, abdomen and pelvis was performed following the standard protocol during bolus administration of intravenous contrast. CONTRAST:  19mL OMNIPAQUE IOHEXOL 300 MG/ML SOLN, additional oral enteric contrast COMPARISON:  Most recent CT chest 08/05/2020. CT abdomen and pelvis 06/27/2020. FINDINGS: CT CHEST FINDINGS Cardiovascular: Right chest port catheter. Aortic atherosclerosis. Normal heart size. Three-vessel coronary artery calcifications. No pericardial effusion. Mediastinum/Nodes: Unchanged post treatment appearance of soft tissue thickening about the left hilum (series 2, image 27). No discretely enlarged mediastinal, hilar, or axillary lymph nodes. Thyroid gland, trachea, and esophagus demonstrate no significant findings. Lungs/Pleura: Interval decrease in size of a lobulated nodule of the peripheral left upper lobe measuring 2.4 x 1.4 cm, previously 2.8 x 1.6 cm (series 4, image 40). There is extensive new heterogeneous and consolidative airspace opacity of the lingula and left lower lobe inferior to this nodule, with a large thick-walled cavitary lesion of the lingula measuring approximately 4.3 x 3.8 cm (series 4, image 67). No pleural effusion or pneumothorax. Musculoskeletal: No chest wall mass or suspicious bone lesions identified. CT ABDOMEN PELVIS FINDINGS Hepatobiliary: No solid liver abnormality is seen. No gallstones,  gallbladder wall thickening, or biliary dilatation. Pancreas: Unremarkable. No pancreatic ductal dilatation or surrounding inflammatory changes. Spleen: Normal in size without significant abnormality. Adrenals/Urinary Tract: Adrenal glands are unremarkable. Small nonobstructive left renal calculus. No right-sided calculi or hydronephrosis. Bladder is unremarkable. Stomach/Bowel: Stomach is within normal limits. Appendix appears normal. No evidence of bowel wall thickening, distention, or inflammatory changes. Occasional sigmoid diverticula. Vascular/Lymphatic: Aortic atherosclerosis. No enlarged abdominal or pelvic lymph nodes. Reproductive: Prostate brachytherapy seeds. Other: No abdominal wall hernia or abnormality. No abdominopelvic ascites. Musculoskeletal: No acute or significant osseous findings. IMPRESSION: 1. Interval decrease in size of lobulated nodule of the peripheral left upper lobe, consistent with treatment response of primary lung malignancy. 2. Extensive new heterogeneous and consolidative airspace opacity of the lingula and left lower lobe, with a large, thick-walled cavitary lesion of the lingula. This appearance, particularly the presence of a cavitary lesion, favors infection rather than radiation pneumonitis or other treatment related side effects, and rapid development compared to prior examination dated 09/13 does not favor progression of malignancy. 3.  Unchanged post treatment appearance of the left hilum. 4.  No evidence of metastatic disease in the abdomen or pelvis. 5.  Nonobstructive left nephrolithiasis. 6.  Prostate brachytherapy. 7.  Coronary artery disease. Aortic Atherosclerosis (ICD10-I70.0). Electronically Signed   By: Eddie Candle M.D.   On: 10/29/2020 10:10   MR Brain W Wo Contrast  Result Date: 11/07/2020 CLINICAL DATA:  Small cell lung cancer, surveillance post treatment. EXAM: MRI HEAD WITHOUT AND WITH CONTRAST TECHNIQUE: Multiplanar, multiecho pulse sequences of the  brain and surrounding structures were obtained without and with intravenous contrast. CONTRAST:  104mL GADAVIST GADOBUTROL 1 MMOL/ML IV SOLN COMPARISON:  MRI of th brain August 03, 2020. FINDINGS: Brain: No acute infarction, hemorrhage, hydrocephalus, extra-axial collection or mass lesion. Posttreatment changes with susceptibility artifact within the left inferior cerebellar hemisphere at the site of previously seen enhancing lesion. No focus of abnormal contrast enhancement to suggest residual/recurrent lesion. No new focus of abnormal contrast enhancement identified. Small amount of scattered foci of T2 hyperintensity within the white matter of the cerebral hemispheres, non specific, unchanged from prior. Mild parenchymal volume loss. Vascular: Normal flow voids. Skull and upper cervical spine: Normal marrow signal. Sinuses/Orbits: Negative. Other: Bilateral mastoid effusion. IMPRESSION: 1. Posttreatment changes with susceptibility artifact within the left inferior cerebellar hemisphere at the site  of previously seen enhancing lesion. No focus of abnormal contrast enhancement to suggest residual/recurrent lesion. 2. No new focus of abnormal contrast enhancement identified. Electronically Signed   By: Pedro Earls M.D.   On: 11/07/2020 17:22   CT Abdomen Pelvis W Contrast  Result Date: 10/29/2020 CLINICAL DATA:  Primary Cancer Type: Lung Imaging Indication: Assess response to therapy Interval therapy since last imaging? Yes Initial Cancer Diagnosis Date: 04/12/2020; Established by: Biopsy-proven Detailed Pathology: Extensive stage small cell lung cancer. Primary Tumor location: Left hilum and left upper lobe. Solitary brain metastasis. Surgeries: No. Chemotherapy: Yes; Ongoing? Yes; Most recent administration: 10/02/2020 Immunotherapy?  Yes; Type: Imfinzi; Ongoing? Yes Radiation therapy? Yes Date Range: 08/26/2020 - 09/06/2020; Target: Prophylactic Cranial Irradiation. Date Range:  04/25/2020-05/15/2020; Target: Left lung. Other Cancer Therapies: Prostate cancer with brachytherapy seeds 2016. EXAM: CT CHEST, ABDOMEN, AND PELVIS WITH CONTRAST TECHNIQUE: Multidetector CT imaging of the chest, abdomen and pelvis was performed following the standard protocol during bolus administration of intravenous contrast. CONTRAST:  166mL OMNIPAQUE IOHEXOL 300 MG/ML SOLN, additional oral enteric contrast COMPARISON:  Most recent CT chest 08/05/2020. CT abdomen and pelvis 06/27/2020. FINDINGS: CT CHEST FINDINGS Cardiovascular: Right chest port catheter. Aortic atherosclerosis. Normal heart size. Three-vessel coronary artery calcifications. No pericardial effusion. Mediastinum/Nodes: Unchanged post treatment appearance of soft tissue thickening about the left hilum (series 2, image 27). No discretely enlarged mediastinal, hilar, or axillary lymph nodes. Thyroid gland, trachea, and esophagus demonstrate no significant findings. Lungs/Pleura: Interval decrease in size of a lobulated nodule of the peripheral left upper lobe measuring 2.4 x 1.4 cm, previously 2.8 x 1.6 cm (series 4, image 40). There is extensive new heterogeneous and consolidative airspace opacity of the lingula and left lower lobe inferior to this nodule, with a large thick-walled cavitary lesion of the lingula measuring approximately 4.3 x 3.8 cm (series 4, image 67). No pleural effusion or pneumothorax. Musculoskeletal: No chest wall mass or suspicious bone lesions identified. CT ABDOMEN PELVIS FINDINGS Hepatobiliary: No solid liver abnormality is seen. No gallstones, gallbladder wall thickening, or biliary dilatation. Pancreas: Unremarkable. No pancreatic ductal dilatation or surrounding inflammatory changes. Spleen: Normal in size without significant abnormality. Adrenals/Urinary Tract: Adrenal glands are unremarkable. Small nonobstructive left renal calculus. No right-sided calculi or hydronephrosis. Bladder is unremarkable. Stomach/Bowel:  Stomach is within normal limits. Appendix appears normal. No evidence of bowel wall thickening, distention, or inflammatory changes. Occasional sigmoid diverticula. Vascular/Lymphatic: Aortic atherosclerosis. No enlarged abdominal or pelvic lymph nodes. Reproductive: Prostate brachytherapy seeds. Other: No abdominal wall hernia or abnormality. No abdominopelvic ascites. Musculoskeletal: No acute or significant osseous findings. IMPRESSION: 1. Interval decrease in size of lobulated nodule of the peripheral left upper lobe, consistent with treatment response of primary lung malignancy. 2. Extensive new heterogeneous and consolidative airspace opacity of the lingula and left lower lobe, with a large, thick-walled cavitary lesion of the lingula. This appearance, particularly the presence of a cavitary lesion, favors infection rather than radiation pneumonitis or other treatment related side effects, and rapid development compared to prior examination dated 09/13 does not favor progression of malignancy. 3.  Unchanged post treatment appearance of the left hilum. 4.  No evidence of metastatic disease in the abdomen or pelvis. 5.  Nonobstructive left nephrolithiasis. 6.  Prostate brachytherapy. 7.  Coronary artery disease. Aortic Atherosclerosis (ICD10-I70.0). Electronically Signed   By: Eddie Candle M.D.   On: 10/29/2020 10:10       IMPRESSION/PLAN: 1. Extensive stage small cell lung cancer arising in the left lung.  Again the patient's disease burden has been to some degree difficult to quite classify because of the fact that the follow-up MRI called the area in the brain subacute infarct, we treated him as though this was not involved with PCI, and he did complete a course of systemic therapy more similar to limited stage disease.  He continues to follow with Dr. Julien Nordmann and we will follow along with him as he continues immunotherapy for consolidation.  We discussed the rationale for continued close follow-up with  MRIs of the brain which would be recommended in 3 months time.  He is in agreement with this plan and is encouraged to call us back if he has questions or concerns that arise prior to his next scan.  Given current concerns for patient exposure during the COVID-19 pandemic, this encounter was conducted via telephone.  The patient has provided two factor identification and has given verbal consent for this type of encounter and has been advised to only accept a meeting of this type in a secure network environment. The time spent during this encounter was 35 minutes including preparation, discussion, and coordination of the patient's care. The attendants for this meeting include  Hayden Pedro  and [name].  During the [place] was located at [facility] Radiation Oncology Department.  MACEN JOSLIN was located at home.     Carola Rhine, PAC

## 2020-11-12 NOTE — Addendum Note (Signed)
Encounter addended by: Hayden Pedro, PA-C on: 11/12/2020 4:19 PM  Actions taken: Level of Service modified, Clinical Note Signed

## 2020-11-21 ENCOUNTER — Encounter: Payer: Self-pay | Admitting: Internal Medicine

## 2020-11-21 NOTE — Assessment & Plan Note (Signed)
Lab Results  Component Value Date   LDLCALC 104 (H) 08/10/2018   To consider statin

## 2020-11-21 NOTE — Assessment & Plan Note (Signed)

## 2020-11-21 NOTE — Progress Notes (Signed)
Established Patient Office Visit  Subjective:  Patient ID: Richard Santos, male    DOB: 1961-09-18  Age: 59 y.o. MRN: [id number]        Chief Complaint: (concise statement describing the symptom, problem, condition, diagnosis, physician recommended return, or other factor as reason for encounter): wellness and lung cancer, pna, hyperglycemia, hld      HPI:  Richard Santos is a 59 y.o. male here for wellness overall doing ok;  Pt denies Chest pain, worsening SOB, DOE, wheezing, orthopnea, PND, worsening LE edema, palpitations, dizziness or syncope.  Pt denies neurological change such as new headache, facial or extremity weakness.  Pt denies polydipsia, polyuria. Pt states overall good compliance with treatment and medications, good medication tolerability, and has been trying to follow appropriate diet.  Pt denies worsening depressive symptoms, suicidal ideation or panic. Denies fever, night sweats, wt loss, loss of appetite, or other constitutional symptoms.  Pt states good ability with ADL's, has low fall risk, home safety reviewed and adequate, no other significant changes in hearing or vision, and occasionally active with exercise.        Wt Readings from Last 3 Encounters:  11/05/20 155 lb (70.3 kg)  10/31/20 152 lb 4.8 oz (69.1 kg)  10/02/20 161 lb 1.6 oz (73.1 kg)   BP Readings from Last 3 Encounters:  11/05/20 90/68  10/31/20 101/77  10/02/20 94/70        Also mentions S/p immuno tx dec 9, now on doxycycline for possible infectious issue on CT on dec 6; Also for MRI brain very soon to f/u known brain metastasis sp xrt.  No fever, cough or worsening sob.  Declines labs today   Past Medical History:  Diagnosis Date   Allergic rhinitis    Allergy    Anxiety    Chronic low back pain    DDD (degenerative disc disease), lumbar    ED (erectile dysfunction)    Finger injury    cut pads off 3rd and 4th finger left hand/due to lawn mower accident   GERD (gastroesophageal reflux  disease)    Heart murmur    as child only-    History of kidney stones    HTN (hypertension) 08/20/2016   Hyperlipidemia    Incomplete right bundle branch block    Prostate cancer ([place]) dx 10/02/14   stage T1c   Prostate cancer ([place])    Sigmoid diverticulosis    Wears glasses    Past Surgical History:  Procedure Laterality Date   BRONCHIAL BIOPSY  04/12/2020   Procedure: BRONCHIAL BIOPSIES;  Surgeon: Marshell Garfinkel, MD;  Location: [facility];  Service: Cardiopulmonary;;   BRONCHIAL BRUSHINGS  04/12/2020   Procedure: BRONCHIAL BRUSHINGS;  Surgeon: Marshell Garfinkel, MD;  Location: [facility];  Service: Cardiopulmonary;;   BRONCHIAL NEEDLE ASPIRATION BIOPSY  04/12/2020   Procedure: BRONCHIAL NEEDLE ASPIRATION BIOPSIES;  Surgeon: Marshell Garfinkel, MD;  Location: [facility];  Service: Cardiopulmonary;;   BRONCHIAL WASHINGS  04/12/2020   Procedure: BRONCHIAL WASHINGS;  Surgeon: Marshell Garfinkel, MD;  Location: [facility];  Service: Cardiopulmonary;;   COLONOSCOPY     COLONOSCOPY W/ POLYPECTOMY  04-19-2012   ENDOBRONCHIAL ULTRASOUND N/A 04/12/2020   Procedure: ENDOBRONCHIAL ULTRASOUND;  Surgeon: Marshell Garfinkel, MD;  Location: [facility];  Service: Cardiopulmonary;  Laterality: N/A;   ESOPHAGOGASTRODUODENOSCOPY  08-05-2010   HEMOSTASIS CONTROL  04/12/2020   Procedure: HEMOSTASIS CONTROL;  Surgeon: Marshell Garfinkel, MD;  Location: [facility];  Service: Cardiopulmonary;;   IR IMAGING GUIDED PORT INSERTION  05/31/2020   POLYPECTOMY     PROSTATE BIOPSY  10/02/14   RADIOACTIVE SEED IMPLANT N/A 01/30/2015   Procedure: RADIOACTIVE SEED IMPLANT;  Surgeon: Rana Snare, MD;  Location: [facility];  Service: Urology;  Laterality: N/A;   UPPER GASTROINTESTINAL ENDOSCOPY     VIDEO BRONCHOSCOPY N/A 04/12/2020   Procedure: VIDEO BRONCHOSCOPY WITHOUT FLUORO;  Surgeon: Marshell Garfinkel, MD;  Location: [facility];  Service: Cardiopulmonary;  Laterality: N/A;     reports that he has been smoking cigarettes. He has a 25.00 pack-year smoking history. He has never used smokeless tobacco. He reports current alcohol use of about 49.0 standard drinks of alcohol per week. He reports that he does not use drugs. family history includes Breast cancer in his sister; Heart disease in his father, mother, and another family member; Prostate cancer in his father. Allergies  Allergen Reactions   Bee Venom Swelling   Sulfur Other (See Comments)    Acute renal failure   Sulfa Antibiotics    Namenda [Memantine] Nausea Only    Nausea and fatigue   Current Outpatient Medications on File Prior to Visit  Medication Sig Dispense Refill   acetaminophen (TYLENOL) 325 MG tablet Take 2 tablets (650 mg total) by mouth every 6 (six) hours as needed for mild pain (or Fever >/= 101). 30 tablet 0   aspirin EC 81 MG tablet Take 81 mg by mouth daily. Swallow whole.     atorvastatin (LIPITOR) 40 MG tablet Take 1 tablet (40 mg total) by mouth daily. 90 tablet 3   Budeson-Glycopyrrol-Formoterol (BREZTRI AEROSPHERE) 160-9-4.8 MCG/ACT AERO Inhale 2 puffs into the lungs in the morning and at bedtime. 10.7 g 2   busPIRone (BUSPAR) 10 MG tablet TAKE 1 TABLET BY MOUTH TWICE A DAY 180 tablet 3   doxycycline (VIBRA-TABS) 100 MG tablet Take 1 tablet (100 mg total) by mouth 2 (two) times daily. 30 tablet 0   lidocaine-prilocaine (EMLA) cream Apply 1 application topically as needed. 30 g 2   meloxicam (MOBIC) 15 MG tablet Take 15 mg by mouth daily.     Multiple Vitamin (MULTIVITAMIN) capsule Take 1 capsule by mouth daily.     pantoprazole (PROTONIX) 40 MG tablet TAKE 1 TABLET BY MOUTH DAILY BEFORE BREAKFAST 90 tablet 3   traMADol (ULTRAM) 50 MG tablet TAKE HALF TABLET BY MOUTH 2 TIMES DAILY AS NEEDED FOR PAIN (Patient taking differently: Take 25 mg by mouth every 12 (twelve) hours as needed for moderate pain. TAKE HALF TABLET BY MOUTH 2 TIMES DAILY AS NEEDED FOR PAIN) 60 tablet 2    triamcinolone cream (KENALOG) 0.5 % Apply 1 application topically 3 (three) times daily. 90 g 1   umeclidinium-vilanterol (ANORO ELLIPTA) 62.5-25 MCG/INH AEPB Inhale 1 puff into the lungs daily. 60 each 2   ondansetron (ZOFRAN-ODT) 8 MG disintegrating tablet TAKE 1 TABLET BY MOUTH EVERY 8 HOURS AS NEEDED FOR NAUSEA OR VOMITING. 18 tablet 1   No current facility-administered medications on file prior to visit.        ROS:  All others reviewed and negative.  Objective        PE:  BP 90/68 (BP Location: Left Arm, Patient Position: Sitting, Cuff Size: Large)    Pulse 92    Temp 99.1 F (37.3 C) (Oral)    Ht 6' (1.829 m)    Wt 155 lb (70.3 kg)    SpO2 96%    BMI 21.02 kg/m  Constitutional: Pt appears in NAD               HENT: Head: NCAT.                Right Ear: External ear normal.                 Left Ear: External ear normal.                Eyes: . Pupils are equal, round, and reactive to light. Conjunctivae and EOM are normal               Nose: without d/c or deformity               Neck: Neck supple. Gross normal ROM               Cardiovascular: Normal rate and regular rhythm.                 Pulmonary/Chest: Effort normal and breath sounds without rales or wheezing.                Abd:  Soft, NT, ND, + BS, no organomegaly               Neurological: Pt is alert. At baseline orientation, motor grossly intact               Skin: Skin is warm. No rashes, no other new lesions, LE edema - none               Psychiatric: Pt behavior is normal without agitation   Assessment/Plan:  Richard Santos is a 59 y.o. White or Caucasian [1] male with  has a past medical history of Allergic rhinitis, Allergy, Anxiety, Chronic low back pain, DDD (degenerative disc disease), lumbar, ED (erectile dysfunction), Finger injury, GERD (gastroesophageal reflux disease), Heart murmur, History of kidney stones, HTN (hypertension) (08/20/2016), Hyperlipidemia, Incomplete right bundle branch  block, Prostate cancer ([place]) (dx 10/02/14), Prostate cancer ([place]), Sigmoid diverticulosis, and Wears glasses.  Assessment Plan  See notes Labs reviewed for each problem: Lab Results  Component Value Date   WBC 9.0 10/31/2020   HGB 9.4 (L) 10/31/2020   HCT 28.0 (L) 10/31/2020   PLT 553 (H) 10/31/2020   GLUCOSE 106 (H) 10/31/2020   CHOL 198 12/12/2019   TRIG 301.0 (H) 12/12/2019   HDL 46.60 12/12/2019   LDLDIRECT 120.0 12/12/2019   LDLCALC 104 (H) 08/10/2018   ALT 15 10/31/2020   AST 23 10/31/2020   NA 140 10/31/2020   K 4.1 10/31/2020   CL 101 10/31/2020   CREATININE 0.82 10/31/2020   BUN 8 10/31/2020   CO2 28 10/31/2020   TSH 0.542 10/31/2020   PSA 0.27 08/10/2018   INR 1.0 04/11/2020   HGBA1C 5.4 12/12/2019    Micro: none  Cardiac tracings I have personally interpreted today:  none  Pertinent Radiological findings (summarize): none    There are no preventive care reminders to display for this patient.   Problem List Items Addressed This Visit      Medium   Impaired glucose tolerance    stable overall by history and exam, recent data reviewed with pt, and pt to continue medical treatment as before,  to f/u any worsening symptoms or concerns Lab Results  Component Value Date   HGBA1C 5.4 12/12/2019        Hyperlipidemia    Lab Results  Component Value Date   LDLCALC 104 (H)  08/10/2018   To consider statin        Unprioritized   Preventative health care - Primary    Overall doing well, age appropriate education and counseling updated, referrals for preventative services and immunizations addressed, dietary and smoking counseling addressed, most recent labs reviewed.  I have personally reviewed and have noted:  1) the patient's medical and social history 2) The pt's use of alcohol, tobacco, and illicit drugs 3) The patient's current medications and supplements 4) Functional ability including ADL's, fall risk, home safety risk, hearing and visual  impairment 5) Diet and physical activities 6) Evidence for depression or mood disorder 7) The patient's height, weight, and BMI have been recorded in the chart  I have made referrals, and provided counseling and education based on review of the above          No orders of the defined types were placed in this encounter.   Follow-up: Return in about 6 months (around 05/06/2021).   Cathlean Cower, MD 11/21/2020 8:06 PM [facility]

## 2020-11-21 NOTE — Assessment & Plan Note (Signed)
stable overall by history and exam, recent data reviewed with pt, and pt to continue medical treatment as before,  to f/u any worsening symptoms or concerns Lab Results  Component Value Date   HGBA1C 5.4 12/12/2019

## 2020-11-22 ENCOUNTER — Encounter: Payer: Self-pay | Admitting: Internal Medicine

## 2020-11-25 ENCOUNTER — Other Ambulatory Visit: Payer: Self-pay | Admitting: Physician Assistant

## 2020-11-25 ENCOUNTER — Other Ambulatory Visit: Payer: Self-pay | Admitting: Internal Medicine

## 2020-11-25 DIAGNOSIS — R110 Nausea: Secondary | ICD-10-CM

## 2020-11-25 MED ORDER — PROCHLORPERAZINE MALEATE 10 MG PO TABS: 10.0000 mg | ORAL_TABLET | Freq: Four times a day (QID) | ORAL | 2 refills | Status: DC | PRN

## 2020-11-26 NOTE — Progress Notes (Signed)
[facility] OFFICE PROGRESS NOTE  [name], MD [place] [address]  DIAGNOSIS: Extensive stage (T2b, N2, M1c)small cell lung cancer presented with left upper lobe pulmonary nodule in addition to left hilar mass with mediastinal invasion and solitary brain metastasis diagnosed in May 2021  PRIOR THERAPY: 1) Palliative radiation to the left hilar and mediastinal lymphadenopathy under the care of Dr. Lisbeth Renshaw. 2) Prophylactic cranial irradiation from 08/26/20-09/06/20 under the care of Dr. Lisbeth Renshaw  CURRENT THERAPY: Palliative systemic chemotherapy with carboplatin for AUC of 5 on day 1, etoposide 100 mg/M2 on days 1, 2 and 3 as well as Imfinzi 1500 mg IV every 3 weeks with the chemotherapy.  First dose of chemotherapy May 06, 2020.  The patient will receive treatment with Cosela on the days of his chemotherapy.  Status post 5 cycles.  Starting from cycle #5 he will be.  He started maintenance immunotherapy with Imfinzi 1500 mg IV every 4 weeks. Status post 3 cycles.    INTERVAL HISTORY: Richard Santos 60 y.o. male returns to the clinic today for a follow-up visit accompanied by his wife.  The patient is feeling fair today without any concerning complaints. The patient called endorsing diarrhea about 1 week ago. He states he started having diarrhea bout 3-4 weeks ago. He has not tried imodium because his symptoms improved. He thinks that he has not had diarrhea in about 10 days or so but reports soft stool. When he was having diarrhea, he estimates he had about 3-5 episodes. Of note, the patient did complete a course of doxycycline in the interval since his last appointment. He denies fevers, chills, abdominal pain, or blood in the stool. He had a CT scan of the abdomen and pelvis to stage his disease last month which did not show evidence of colitis.   Otherwise, The patient tolerated his last cycle of immunotherapy with Imfinzi well without any concerning adverse  side effects.  Today, the patient denies any chills or night sweats. He did lose about 6 lbs since his last appointment. He has smell/taste aversions to food. He will take a nausea medication about 45 mins prior to eating in the AM due to smells causing nausea. He does not have evidence of thrush. He has boost/ensure at home but has not used it. He reports his baseline dyspnea on exertion and productive cough.  He denies any associated hemoptysis.  He denies any headache or visual changes. The patient denies any rashes or skin changes.  The patient is here today for evaluation before starting cycle #8.    MEDICAL HISTORY: Past Medical History:  Diagnosis Date   Allergic rhinitis    Allergy    Anxiety    Chronic low back pain    DDD (degenerative disc disease), lumbar    ED (erectile dysfunction)    Finger injury    cut pads off 3rd and 4th finger left hand/due to lawn mower accident   GERD (gastroesophageal reflux disease)    Heart murmur    as child only-    History of kidney stones    HTN (hypertension) 08/20/2016   Hyperlipidemia    Incomplete right bundle branch block    Prostate cancer ([facility]) dx 10/02/14   stage T1c   Prostate cancer ([facility])    Sigmoid diverticulosis    Wears glasses     ALLERGIES:  is allergic to bee venom, sulfur, sulfa antibiotics, and namenda [memantine].  MEDICATIONS:  Current Outpatient  Medications  Medication Sig Dispense Refill   acetaminophen (TYLENOL) 325 MG tablet Take 2 tablets (650 mg total) by mouth every 6 (six) hours as needed for mild pain (or Fever >/= 101). 30 tablet 0   aspirin EC 81 MG tablet Take 81 mg by mouth daily. Swallow whole.     Budeson-Glycopyrrol-Formoterol (BREZTRI AEROSPHERE) 160-9-4.8 MCG/ACT AERO Inhale 2 puffs into the lungs in the morning and at bedtime. 10.7 g 2   busPIRone (BUSPAR) 10 MG tablet TAKE 1 TABLET BY MOUTH TWICE A DAY 180 tablet 3   lidocaine-prilocaine (EMLA) cream Apply 1 application  topically as needed. 30 g 2   meloxicam (MOBIC) 15 MG tablet Take 15 mg by mouth daily.     Multiple Vitamin (MULTIVITAMIN) capsule Take 1 capsule by mouth daily.     pantoprazole (PROTONIX) 40 MG tablet TAKE 1 TABLET BY MOUTH DAILY BEFORE BREAKFAST 90 tablet 3   traMADol (ULTRAM) 50 MG tablet TAKE HALF TABLET BY MOUTH 2 TIMES DAILY AS NEEDED FOR PAIN 60 tablet 2   triamcinolone cream (KENALOG) 0.5 % Apply 1 application topically 3 (three) times daily. 90 g 1   umeclidinium-vilanterol (ANORO ELLIPTA) 62.5-25 MCG/INH AEPB Inhale 1 puff into the lungs daily. 60 each 2   ondansetron (ZOFRAN-ODT) 8 MG disintegrating tablet Take 1 tablet (8 mg total) by mouth once for 1 dose. 30 tablet 2   prochlorperazine (COMPAZINE) 10 MG tablet Take 1 tablet (10 mg total) by mouth every 6 (six) hours as needed. (Patient not taking: Reported on 11/28/2020) 30 tablet 2   No current facility-administered medications for this visit.   Facility-Administered Medications Ordered in Other Visits  Medication Dose Route Frequency Provider Last Rate Last Admin   durvalumab (IMFINZI) 1,500 mg in sodium chloride 0.9 % 100 mL chemo infusion  1,500 mg Intravenous Once Curt Bears, MD       heparin lock flush 100 unit/mL  500 Units Intracatheter Once PRN Curt Bears, MD       sodium chloride flush (NS) 0.9 % injection 10 mL  10 mL Intracatheter PRN Curt Bears, MD        SURGICAL HISTORY:  Past Surgical History:  Procedure Laterality Date   BRONCHIAL BIOPSY  04/12/2020   Procedure: BRONCHIAL BIOPSIES;  Surgeon: Marshell Garfinkel, MD;  Location: [facility];  Service: Cardiopulmonary;;   BRONCHIAL BRUSHINGS  04/12/2020   Procedure: BRONCHIAL BRUSHINGS;  Surgeon: Marshell Garfinkel, MD;  Location: [facility];  Service: Cardiopulmonary;;   BRONCHIAL NEEDLE ASPIRATION BIOPSY  04/12/2020   Procedure: BRONCHIAL NEEDLE ASPIRATION BIOPSIES;  Surgeon: Marshell Garfinkel, MD;  Location: [facility];  Service:  Cardiopulmonary;;   BRONCHIAL WASHINGS  04/12/2020   Procedure: BRONCHIAL WASHINGS;  Surgeon: Marshell Garfinkel, MD;  Location: [facility];  Service: Cardiopulmonary;;   COLONOSCOPY     COLONOSCOPY W/ POLYPECTOMY  04-19-2012   ENDOBRONCHIAL ULTRASOUND N/A 04/12/2020   Procedure: ENDOBRONCHIAL ULTRASOUND;  Surgeon: Marshell Garfinkel, MD;  Location: [facility];  Service: Cardiopulmonary;  Laterality: N/A;   ESOPHAGOGASTRODUODENOSCOPY  08-05-2010   HEMOSTASIS CONTROL  04/12/2020   Procedure: HEMOSTASIS CONTROL;  Surgeon: Marshell Garfinkel, MD;  Location: [facility];  Service: Cardiopulmonary;;   IR IMAGING GUIDED PORT INSERTION  05/31/2020   POLYPECTOMY     PROSTATE BIOPSY  10/02/14   RADIOACTIVE SEED IMPLANT N/A 01/30/2015   Procedure: RADIOACTIVE SEED IMPLANT;  Surgeon: Rana Snare, MD;  Location: [facility];  Service: Urology;  Laterality: N/A;   UPPER GASTROINTESTINAL ENDOSCOPY  VIDEO BRONCHOSCOPY N/A 04/12/2020   Procedure: VIDEO BRONCHOSCOPY WITHOUT FLUORO;  Surgeon: Marshell Garfinkel, MD;  Location: [facility];  Service: Cardiopulmonary;  Laterality: N/A;    REVIEW OF SYSTEMS:   Review of Systems  Constitutional: Positive for appetite change and weight loss. Negative for chills, fatigue, and fever.  HENT: Positive for taste alterations. Negative for mouth sores, nosebleeds, sore throat and trouble swallowing.   Eyes: Negative for eye problems and icterus.  Respiratory: Positive for baseline cough and shortness of breath with exertion. Negative for hemoptysis and wheezing.   Cardiovascular: Negative for chest pain and leg swelling.  Gastrointestinal: Positive for diarrhea (improved). Negative for abdominal pain, constipation, nausea and vomiting.  Genitourinary: Negative for bladder incontinence, difficulty urinating, dysuria, frequency and hematuria.   Musculoskeletal: Negative for back pain, gait problem, neck pain and neck stiffness.  Skin: Negative for  itching and rash.  Neurological: Negative for dizziness, extremity weakness, gait problem, headaches, light-headedness and seizures.  Hematological: Negative for adenopathy. Does not bruise/bleed easily.  Psychiatric/Behavioral: Negative for confusion, depression and sleep disturbance. The patient is not nervous/anxious.     PHYSICAL EXAMINATION:  Blood pressure 115/81, pulse 97, temperature 97.7 F (36.5 C), temperature source Tympanic, resp. rate 17, height 6' (1.829 m), weight 149 lb 9.6 oz (67.9 kg), SpO2 99 %.  ECOG PERFORMANCE STATUS: 1 - Symptomatic but completely ambulatory  Physical Exam  Constitutional: Oriented to person, place, and time and thin appearing male and in no distress.  HENT:  Head: Normocephalic and atraumatic.  Mouth/Throat: Oropharynx is clear and moist. No oropharyngeal exudate.  Eyes: Conjunctivae are normal. Right eye exhibits no discharge. Left eye exhibits no discharge. No scleral icterus.  Neck: Normal range of motion. Neck supple.  Cardiovascular: Normal rate, regular rhythm, normal heart sounds and intact distal pulses.   Pulmonary/Chest: Effort normal and breath sounds normal. No respiratory distress. No wheezes. No rales.  Abdominal: Soft. Bowel sounds are normal. Exhibits no distension and no mass. There is no tenderness.  Musculoskeletal: Normal range of motion. Exhibits no edema.  Lymphadenopathy:    No cervical adenopathy.  Neurological: Alert and oriented to person, place, and time. Exhibits normal muscle tone. Gait normal. Coordination normal.  Skin: Skin is warm and dry. No rash noted. Not diaphoretic. No erythema. No pallor.  Psychiatric: Mood, memory and judgment normal.  Vitals reviewed.  LABORATORY DATA: Lab Results  Component Value Date   WBC 5.6 11/28/2020   HGB 11.0 (L) 11/28/2020   HCT 33.1 (L) 11/28/2020   MCV 105.4 (H) 11/28/2020   PLT 244 11/28/2020      Chemistry      Component Value Date/Time   NA 138 11/28/2020 1115    K 3.7 11/28/2020 1115   CL 104 11/28/2020 1115   CO2 26 11/28/2020 1115   BUN 9 11/28/2020 1115   CREATININE 0.81 11/28/2020 1115      Component Value Date/Time   CALCIUM 9.4 11/28/2020 1115   ALKPHOS 88 11/28/2020 1115   AST 41 11/28/2020 1115   ALT 40 11/28/2020 1115   BILITOT 0.5 11/28/2020 1115       RADIOGRAPHIC STUDIES:  MR Brain W Wo Contrast  Result Date: 11/07/2020 CLINICAL DATA:  Small cell lung cancer, surveillance post treatment. EXAM: MRI HEAD WITHOUT AND WITH CONTRAST TECHNIQUE: Multiplanar, multiecho pulse sequences of the brain and surrounding structures were obtained without and with intravenous contrast. CONTRAST:  104mL GADAVIST GADOBUTROL 1 MMOL/ML IV SOLN COMPARISON:  MRI of th brain August 03, 2020. FINDINGS: Brain: No acute infarction, hemorrhage, hydrocephalus, extra-axial collection or mass lesion. Posttreatment changes with susceptibility artifact within the left inferior cerebellar hemisphere at the site of previously seen enhancing lesion. No focus of abnormal contrast enhancement to suggest residual/recurrent lesion. No new focus of abnormal contrast enhancement identified. Small amount of scattered foci of T2 hyperintensity within the white matter of the cerebral hemispheres, non specific, unchanged from prior. Mild parenchymal volume loss. Vascular: Normal flow voids. Skull and upper cervical spine: Normal marrow signal. Sinuses/Orbits: Negative. Other: Bilateral mastoid effusion. IMPRESSION: 1. Posttreatment changes with susceptibility artifact within the left inferior cerebellar hemisphere at the site of previously seen enhancing lesion. No focus of abnormal contrast enhancement to suggest residual/recurrent lesion. 2. No new focus of abnormal contrast enhancement identified. Electronically Signed   By: Pedro Earls M.D.   On: 11/07/2020 17:22     ASSESSMENT/PLAN:  This is a very pleasant 60 year old Caucasian male diagnosed with  extensive stage small cell lung cancer.  He presented with a left upper lobe lung mass in addition to left hilar mediastinal lymphadenopathy.  He also had metastatic disease to the brain.  He was diagnosed in May 2021.  He underwent palliative radiotherapy to the left hilar and mediastinal lymphadenopathy in addition to radiotherapy to the brain metastasis.  He is currently undergoing systemic chemotherapy with carboplatin for an AUC of 5 on day 1, etoposide 100 mg per metered squared on days 1, 2, and 3 with cocell infusion before chemotherapy.  He is status post 7 cycles of treatment.  Starting from cycle #5, the patient is on maintenance immunotherapy IV every 4 weeks.   He is tolerating this well.   Reviewed his symptoms with Dr. Julien Nordmann. His last scan did not show evidence of colitis. His symptoms resolved without the use of steroids. No fevers, abdominal pain, or blood in the stool. His diarrhea and loose stool likely related to his recent antibiotic use. I encouraged the patient to use a probiotic and drink plenty of fluids. If he develops significant diarrhea following treatment today, he was advised to call us. Labs were reviewed.  Recommend that he proceed cycle #8 today scheduled.  We will see him back for follow-up visit in 4 weeks for evaluation before starting cycle #9.  The patient was encouraged to use boost/ensure for his weight loss. Advised to use salt water rinses for taste alterations. No evidence of thrush on exam. If certain foods/smells cause nausea, discussed he may take his anti-emetic prior to eating. I refilled his zofran and compazine. He wanted to know if he can have anti-emetics while in the clinic with immunotherapy. Discussed this is not build in the treatment plan but he may take his zofran or compazine prior to his appointment.   The patient was advised to call immediately if he has any concerning symptoms in the interval. The patient voices understanding of current  disease status and treatment options and is in agreement with the current care plan. All questions were answered. The patient knows to call the clinic with any problems, questions or concerns. We can certainly see the patient much sooner if necessary  I spent a total time of 24 minutes for this encounter.     No orders of the defined types were placed in this encounter.    Kerrington Sova L Timea Breed, PA-C 11/28/20

## 2020-11-28 ENCOUNTER — Other Ambulatory Visit: Payer: Self-pay

## 2020-11-28 ENCOUNTER — Encounter: Payer: Self-pay | Admitting: Physician Assistant

## 2020-11-28 ENCOUNTER — Inpatient Hospital Stay: Payer: 59

## 2020-11-28 ENCOUNTER — Inpatient Hospital Stay: Payer: 59 | Attending: Internal Medicine | Admitting: Physician Assistant

## 2020-11-28 VITALS — BP 115/81 | HR 97 | Temp 97.7°F | Resp 17 | Ht 72.0 in | Wt 149.6 lb

## 2020-11-28 DIAGNOSIS — Z95828 Presence of other vascular implants and grafts: Secondary | ICD-10-CM

## 2020-11-28 DIAGNOSIS — Z5112 Encounter for antineoplastic immunotherapy: Secondary | ICD-10-CM | POA: Insufficient documentation

## 2020-11-28 DIAGNOSIS — E785 Hyperlipidemia, unspecified: Secondary | ICD-10-CM | POA: Diagnosis not present

## 2020-11-28 DIAGNOSIS — C3412 Malignant neoplasm of upper lobe, left bronchus or lung: Secondary | ICD-10-CM

## 2020-11-28 DIAGNOSIS — Z79899 Other long term (current) drug therapy: Secondary | ICD-10-CM | POA: Insufficient documentation

## 2020-11-28 DIAGNOSIS — I10 Essential (primary) hypertension: Secondary | ICD-10-CM | POA: Diagnosis not present

## 2020-11-28 DIAGNOSIS — Z7982 Long term (current) use of aspirin: Secondary | ICD-10-CM | POA: Insufficient documentation

## 2020-11-28 DIAGNOSIS — C7931 Secondary malignant neoplasm of brain: Secondary | ICD-10-CM | POA: Insufficient documentation

## 2020-11-28 DIAGNOSIS — Z8546 Personal history of malignant neoplasm of prostate: Secondary | ICD-10-CM | POA: Insufficient documentation

## 2020-11-28 LAB — CMP (CANCER CENTER ONLY)
ALT: 40 U/L (ref 0–44)
AST: 41 U/L (ref 15–41)
Albumin: 3.5 g/dL (ref 3.5–5.0)
Alkaline Phosphatase: 88 U/L (ref 38–126)
Anion gap: 8 (ref 5–15)
BUN: 9 mg/dL (ref 6–20)
CO2: 26 mmol/L (ref 22–32)
Calcium: 9.4 mg/dL (ref 8.9–10.3)
Chloride: 104 mmol/L (ref 98–111)
Creatinine: 0.81 mg/dL (ref 0.61–1.24)
GFR, Estimated: >60 mL/min (ref >60)
Glucose, Bld: 121 mg/dL — ABNORMAL HIGH (ref 70–99)
Potassium: 3.7 mmol/L (ref 3.5–5.1)
Sodium: 138 mmol/L (ref 135–145)
Total Bilirubin: 0.5 mg/dL (ref 0.3–1.2)
Total Protein: 7.6 g/dL (ref 6.5–8.1)

## 2020-11-28 LAB — CBC WITH DIFFERENTIAL (CANCER CENTER ONLY)
Abs Immature Granulocytes: 0.02 10*3/uL (ref 0.00–0.07)
Basophils Absolute: 0 10*3/uL (ref 0.0–0.1)
Basophils Relative: 0 %
Eosinophils Absolute: 0.2 10*3/uL (ref 0.0–0.5)
Eosinophils Relative: 4 %
HCT: 33.1 % — ABNORMAL LOW (ref 39.0–52.0)
Hemoglobin: 11 g/dL — ABNORMAL LOW (ref 13.0–17.0)
Immature Granulocytes: 0 %
Lymphocytes Relative: 11 %
Lymphs Abs: 0.6 10*3/uL — ABNORMAL LOW (ref 0.7–4.0)
MCH: 35 pg — ABNORMAL HIGH (ref 26.0–34.0)
MCHC: 33.2 g/dL (ref 30.0–36.0)
MCV: 105.4 fL — ABNORMAL HIGH (ref 80.0–100.0)
Monocytes Absolute: 0.4 10*3/uL (ref 0.1–1.0)
Monocytes Relative: 8 %
Neutro Abs: 4.4 10*3/uL (ref 1.7–7.7)
Neutrophils Relative %: 77 %
Platelet Count: 244 10*3/uL (ref 150–400)
RBC: 3.14 MIL/uL — ABNORMAL LOW (ref 4.22–5.81)
RDW: 16.1 % — ABNORMAL HIGH (ref 11.5–15.5)
WBC Count: 5.6 10*3/uL (ref 4.0–10.5)
nRBC: 0 % (ref 0.0–0.2)

## 2020-11-28 LAB — TSH: TSH: 0.293 u[IU]/mL — ABNORMAL LOW (ref 0.320–4.118)

## 2020-11-28 MED ORDER — SODIUM CHLORIDE 0.9% FLUSH
10.0000 mL | Freq: Once | INTRAVENOUS | Status: AC
  Administered 2020-11-28: 10 mL
  Filled 2020-11-28: qty 10

## 2020-11-28 MED ORDER — SODIUM CHLORIDE 0.9% FLUSH
10.0000 mL | INTRAVENOUS | Status: DC | PRN
  Administered 2020-11-28: 10 mL
  Filled 2020-11-28: qty 10

## 2020-11-28 MED ORDER — SODIUM CHLORIDE 0.9 % IV SOLN
1500.0000 mg | Freq: Once | INTRAVENOUS | Status: AC
  Administered 2020-11-28: 1500 mg via INTRAVENOUS
  Filled 2020-11-28: qty 30

## 2020-11-28 MED ORDER — ONDANSETRON 8 MG PO TBDP: 8.0000 mg | ORAL_TABLET | Freq: Once | ORAL | 2 refills | Status: AC

## 2020-11-28 MED ORDER — HEPARIN SOD (PORK) LOCK FLUSH 100 UNIT/ML IV SOLN
500.0000 [IU] | Freq: Once | INTRAVENOUS | Status: AC | PRN
  Administered 2020-11-28: 500 [IU]
  Filled 2020-11-28: qty 5

## 2020-11-28 MED ORDER — SODIUM CHLORIDE 0.9 % IV SOLN
Freq: Once | INTRAVENOUS | Status: AC
  Filled 2020-11-28: qty 250

## 2020-11-28 NOTE — Patient Instructions (Signed)
[facility] Discharge Instructions for Patients Receiving Chemotherapy  Today you received the following chemotherapy agent: Imfinzi  To help prevent nausea and vomiting after your treatment, we encourage you to take your nausea medication as directed.   If you develop nausea and vomiting that is not controlled by your nausea medication, call the clinic.   BELOW ARE SYMPTOMS THAT SHOULD BE REPORTED IMMEDIATELY:  *FEVER GREATER THAN 100.5 F  *CHILLS WITH OR WITHOUT FEVER  NAUSEA AND VOMITING THAT IS NOT CONTROLLED WITH YOUR NAUSEA MEDICATION  *UNUSUAL SHORTNESS OF BREATH  *UNUSUAL BRUISING OR BLEEDING  TENDERNESS IN MOUTH AND THROAT WITH OR WITHOUT PRESENCE OF ULCERS  *URINARY PROBLEMS  *BOWEL PROBLEMS  UNUSUAL RASH Items with * indicate a potential emergency and should be followed up as soon as possible.  Feel free to call the clinic should you have any questions or concerns. The clinic phone number is [phone].  Please show the [facility] at check-in to the Emergency Department and triage nurse.

## 2020-11-29 ENCOUNTER — Other Ambulatory Visit: Payer: Self-pay | Admitting: Internal Medicine

## 2020-11-29 NOTE — Telephone Encounter (Signed)
Please refill as per office routine med refill policy (all routine meds refilled for 3 mo or monthly per pt preference up to one year from last visit, then month to month grace period for 3 mo, then further med refills will have to be denied)  

## 2020-11-29 NOTE — Telephone Encounter (Signed)
Sorry no need to refill

## 2020-12-02 ENCOUNTER — Telehealth: Payer: Self-pay | Admitting: Physician Assistant

## 2020-12-02 NOTE — Telephone Encounter (Signed)
scheduled appts per 1/6 los. Pt confirmed appt date and time.

## 2020-12-20 ENCOUNTER — Telehealth: Payer: Self-pay | Admitting: Internal Medicine

## 2020-12-20 NOTE — Telephone Encounter (Signed)
Rescheduled upcoming appointment due to provider's lunch. Patient is aware of changes.

## 2020-12-25 ENCOUNTER — Inpatient Hospital Stay: Payer: 59 | Admitting: Nutrition

## 2020-12-25 ENCOUNTER — Inpatient Hospital Stay (HOSPITAL_BASED_OUTPATIENT_CLINIC_OR_DEPARTMENT_OTHER): Payer: 59 | Admitting: Internal Medicine

## 2020-12-25 ENCOUNTER — Inpatient Hospital Stay: Payer: 59 | Admitting: Internal Medicine

## 2020-12-25 ENCOUNTER — Encounter: Payer: Self-pay | Admitting: Internal Medicine

## 2020-12-25 ENCOUNTER — Other Ambulatory Visit: Payer: 59

## 2020-12-25 ENCOUNTER — Inpatient Hospital Stay: Payer: 59 | Attending: Internal Medicine

## 2020-12-25 ENCOUNTER — Inpatient Hospital Stay: Payer: 59

## 2020-12-25 ENCOUNTER — Other Ambulatory Visit: Payer: Self-pay

## 2020-12-25 ENCOUNTER — Ambulatory Visit: Payer: 59 | Admitting: Internal Medicine

## 2020-12-25 VITALS — BP 106/79 | HR 87 | Temp 97.9°F | Resp 14 | Ht 72.0 in | Wt 151.0 lb

## 2020-12-25 DIAGNOSIS — Z5112 Encounter for antineoplastic immunotherapy: Secondary | ICD-10-CM

## 2020-12-25 DIAGNOSIS — C3412 Malignant neoplasm of upper lobe, left bronchus or lung: Secondary | ICD-10-CM | POA: Insufficient documentation

## 2020-12-25 DIAGNOSIS — Z95828 Presence of other vascular implants and grafts: Secondary | ICD-10-CM

## 2020-12-25 DIAGNOSIS — R1310 Dysphagia, unspecified: Secondary | ICD-10-CM | POA: Diagnosis not present

## 2020-12-25 DIAGNOSIS — C3490 Malignant neoplasm of unspecified part of unspecified bronchus or lung: Secondary | ICD-10-CM

## 2020-12-25 DIAGNOSIS — Z7982 Long term (current) use of aspirin: Secondary | ICD-10-CM | POA: Diagnosis not present

## 2020-12-25 DIAGNOSIS — Z79899 Other long term (current) drug therapy: Secondary | ICD-10-CM | POA: Insufficient documentation

## 2020-12-25 DIAGNOSIS — Z8546 Personal history of malignant neoplasm of prostate: Secondary | ICD-10-CM | POA: Insufficient documentation

## 2020-12-25 DIAGNOSIS — I10 Essential (primary) hypertension: Secondary | ICD-10-CM | POA: Diagnosis not present

## 2020-12-25 DIAGNOSIS — C7931 Secondary malignant neoplasm of brain: Secondary | ICD-10-CM | POA: Diagnosis present

## 2020-12-25 LAB — CBC WITH DIFFERENTIAL (CANCER CENTER ONLY)
Abs Immature Granulocytes: 0.01 10*3/uL (ref 0.00–0.07)
Basophils Absolute: 0 10*3/uL (ref 0.0–0.1)
Basophils Relative: 1 %
Eosinophils Absolute: 0.3 10*3/uL (ref 0.0–0.5)
Eosinophils Relative: 7 %
HCT: 41.4 % (ref 39.0–52.0)
Hemoglobin: 14.1 g/dL (ref 13.0–17.0)
Immature Granulocytes: 0 %
Lymphocytes Relative: 14 %
Lymphs Abs: 0.6 10*3/uL — ABNORMAL LOW (ref 0.7–4.0)
MCH: 37.3 pg — ABNORMAL HIGH (ref 26.0–34.0)
MCHC: 34.1 g/dL (ref 30.0–36.0)
MCV: 109.5 fL — ABNORMAL HIGH (ref 80.0–100.0)
Monocytes Absolute: 0.3 10*3/uL (ref 0.1–1.0)
Monocytes Relative: 8 %
Neutro Abs: 3 10*3/uL (ref 1.7–7.7)
Neutrophils Relative %: 70 %
Platelet Count: 188 10*3/uL (ref 150–400)
RBC: 3.78 MIL/uL — ABNORMAL LOW (ref 4.22–5.81)
RDW: 15.4 % (ref 11.5–15.5)
WBC Count: 4.3 10*3/uL (ref 4.0–10.5)
nRBC: 0 % (ref 0.0–0.2)

## 2020-12-25 LAB — CMP (CANCER CENTER ONLY)
ALT: 38 U/L (ref 0–44)
AST: 41 U/L (ref 15–41)
Albumin: 3.7 g/dL (ref 3.5–5.0)
Alkaline Phosphatase: 81 U/L (ref 38–126)
Anion gap: 9 (ref 5–15)
BUN: 10 mg/dL (ref 6–20)
CO2: 29 mmol/L (ref 22–32)
Calcium: 9.2 mg/dL (ref 8.9–10.3)
Chloride: 104 mmol/L (ref 98–111)
Creatinine: 0.84 mg/dL (ref 0.61–1.24)
GFR, Estimated: >60 mL/min (ref >60)
Glucose, Bld: 104 mg/dL — ABNORMAL HIGH (ref 70–99)
Potassium: 3.9 mmol/L (ref 3.5–5.1)
Sodium: 142 mmol/L (ref 135–145)
Total Bilirubin: 0.5 mg/dL (ref 0.3–1.2)
Total Protein: 7.5 g/dL (ref 6.5–8.1)

## 2020-12-25 LAB — TSH: TSH: 0.358 u[IU]/mL (ref 0.320–4.118)

## 2020-12-25 MED ORDER — SODIUM CHLORIDE 0.9 % IV SOLN
Freq: Once | INTRAVENOUS | Status: AC
  Filled 2020-12-25: qty 250

## 2020-12-25 MED ORDER — HEPARIN SOD (PORK) LOCK FLUSH 100 UNIT/ML IV SOLN
500.0000 [IU] | Freq: Once | INTRAVENOUS | Status: AC | PRN
  Administered 2020-12-25: 500 [IU]
  Filled 2020-12-25: qty 5

## 2020-12-25 MED ORDER — SODIUM CHLORIDE 0.9% FLUSH
10.0000 mL | INTRAVENOUS | Status: DC | PRN
  Administered 2020-12-25: 10 mL
  Filled 2020-12-25: qty 10

## 2020-12-25 MED ORDER — SODIUM CHLORIDE 0.9 % IV SOLN
1500.0000 mg | Freq: Once | INTRAVENOUS | Status: AC
  Administered 2020-12-25: 1500 mg via INTRAVENOUS
  Filled 2020-12-25: qty 30

## 2020-12-25 MED ORDER — SODIUM CHLORIDE 0.9% FLUSH
10.0000 mL | Freq: Once | INTRAVENOUS | Status: AC
  Administered 2020-12-25: 10 mL
  Filled 2020-12-25: qty 10

## 2020-12-25 NOTE — Patient Instructions (Signed)
Durvalumab injection What is this medicine? DURVALUMAB (dur VAL ue mab) is a monoclonal antibody. It is used to treat lung cancer. This medicine may be used for other purposes; ask your health care provider or pharmacist if you have questions. COMMON BRAND NAME(S): IMFINZI What should I tell my health care provider before I take this medicine? They need to know if you have any of these conditions:  autoimmune diseases like Crohn's disease, ulcerative colitis, or lupus  have had or planning to have an allogeneic stem cell transplant (uses someone else's stem cells)  history of organ transplant  history of radiation to the chest  nervous system problems like myasthenia gravis or Guillain-Barre syndrome  an unusual or allergic reaction to durvalumab, other medicines, foods, dyes, or preservatives  pregnant or trying to get pregnant  breast-feeding How should I use this medicine? This medicine is for infusion into a vein. It is given by a health care professional in a hospital or clinic setting. A special MedGuide will be given to you before each treatment. Be sure to read this information carefully each time. Talk to your pediatrician regarding the use of this medicine in children. Special care may be needed. Overdosage: If you think you have taken too much of this medicine contact a poison control center or emergency room at once. NOTE: This medicine is only for you. Do not share this medicine with others. What if I miss a dose? It is important not to miss your dose. Call your doctor or health care professional if you are unable to keep an appointment. What may interact with this medicine? Interactions have not been studied. This list may not describe all possible interactions. Give your health care provider a list of all the medicines, herbs, non-prescription drugs, or dietary supplements you use. Also tell them if you smoke, drink alcohol, or use illegal drugs. Some items may  interact with your medicine. What should I watch for while using this medicine? This drug may make you feel generally unwell. Continue your course of treatment even though you feel ill unless your doctor tells you to stop. You may need blood work done while you are taking this medicine. Do not become pregnant while taking this medicine or for 3 months after stopping it. Women should inform their doctor if they wish to become pregnant or think they might be pregnant. There is a potential for serious side effects to an unborn child. Talk to your health care professional or pharmacist for more information. Do not breast-feed an infant while taking this medicine or for 3 months after stopping it. What side effects may I notice from receiving this medicine? Side effects that you should report to your doctor or health care professional as soon as possible:  allergic reactions like skin rash, itching or hives, swelling of the face, lips, or tongue  black, tarry stools  bloody or watery diarrhea  breathing problems  change in emotions or moods  change in sex drive  changes in vision  chest pain or chest tightness  chills  confusion  cough  facial flushing  fever  headache  signs and symptoms of high blood sugar such as dizziness; dry mouth; dry skin; fruity breath; nausea; stomach pain; increased hunger or thirst; increased urination  signs and symptoms of liver injury like dark yellow or brown urine; general ill feeling or flu-like symptoms; light-colored stools; loss of appetite; nausea; right upper belly pain; unusually weak or tired; yellowing of the eyes or skin  stomach pain  trouble passing urine or change in the amount of urine  weight gain or weight loss Side effects that usually do not require medical attention (report these to your doctor or health care professional if they continue or are bothersome):  bone pain  constipation  loss of appetite  muscle  pain  nausea  swelling of the ankles, feet, hands  tiredness This list may not describe all possible side effects. Call your doctor for medical advice about side effects. You may report side effects to FDA at 1-800-FDA-1088. Where should I keep my medicine? This drug is given in a hospital or clinic and will not be stored at home. NOTE: This sheet is a summary. It may not cover all possible information. If you have questions about this medicine, talk to your doctor, pharmacist, or health care provider.  2021 Elsevier/Gold Standard (2020-01-18 13:01:29)

## 2020-12-25 NOTE — Progress Notes (Signed)
Patient was identified to be at risk for malnutrition on the MST secondary to poor appetite and weight loss.  60 year old male diagnosed with small cell lung cancer followed by Dr. Julien Nordmann.  Patient is receiving chemotherapy.  Past medical history includes anxiety, DDD, GERD, hypertension, hyperlipidemia, prostate cancer, sigmoid diverticulosis.  Medications include multivitamin, Zofran, Protonix.  Labs include glucose 121 and albumin 2.8 on January 6.  Height: 6 feet 0 inches. Weight: 151 pounds.  Increased from 149.6 pounds. Usual body weight: 161 pounds in November 2021. BMI: 20.48.  Patient had history of fatigue, poor appetite, weight loss, as well as dysphagia and odynophagia secondary to radiation therapy. Patient currently denies nausea, vomiting, constipation, and diarrhea. His nausea has been controlled on medication. He is now on medication for constipation. His appetite is improving and he is eating 3 meals a day. He tried boost oral nutrition supplement but did not tolerate it.  He does not wish to try additional supplements at this time.  Nutrition diagnosis:  Unintended weight loss related to lung cancer and associated treatments as evidenced by unintentional weight loss of approximately 10 pounds from usual body weight.  Intervention: Educated patient to continue strategies for adequate calorie and protein intake for weight maintenance. Educated on high-calorie, high-protein meals and snacks and provided fact sheets. Focus on high-protein foods rather than oral nutrition supplements. Provided contact information for further questions.  Monitoring, evaluation, goals: Patient will tolerate adequate calories and protein to minimize further weight loss.  Next visit: No follow-up scheduled.  Patient has contact information for questions or concerns.  **Disclaimer: This note was dictated with voice recognition software. Similar sounding words can inadvertently be  transcribed and this note may contain transcription errors which may not have been corrected upon publication of note.**

## 2020-12-25 NOTE — Patient Instructions (Signed)

## 2020-12-25 NOTE — Progress Notes (Signed)
[place] [phone]   [phone]  OFFICE PROGRESS NOTE  [name], MD [place] [id number]  DIAGNOSIS: Extensive stage (T2b, N2, M1c) small cell lung cancer presented with left upper lobe pulmonary nodule in addition to left hilar mass with mediastinal invasion and solitary brain metastasis diagnosed in May 2021  PRIOR THERAPY: Palliative radiation to the left hilar and mediastinal lymphadenopathy under the care of Dr. Lisbeth Renshaw.  CURRENT THERAPY:  Palliative systemic chemotherapy with carboplatin for AUC of 5 on day 1, etoposide 100 mg/M2 on days 1, 2 and 3 as well as Imfinzi 1500 mg IV every 3 weeks with the chemotherapy.  First dose of chemotherapy May 06, 2020.  The patient will receive treatment with Cosela on the days of his chemotherapy.  Status post 5 cycles.  Starting from cycle #5 he will be.  He did wear his maintenance immunotherapy with Imfinzi 1500 mg IV every 4 weeks. Status post 3 cycles.   INTERVAL HISTORY: Richard Santos 60 y.o. male returns to the clinic today for follow-up visit accompanied by his wife.  The patient is feeling much better today with less fatigue and weakness.  He denied having any current chest pain, shortness of breath, cough or hemoptysis.  He has no nausea, vomiting, diarrhea or constipation.  He denied having any headache or visual changes.  He tolerated the last cycle of his treatment fairly well.  The patient is here today for evaluation before starting cycle #4 of his maintenance therapy.   MEDICAL HISTORY: Past Medical History:  Diagnosis Date  . Allergic rhinitis   . Allergy   . Anxiety   . Chronic low back pain   . DDD (degenerative disc disease), lumbar   . ED (erectile dysfunction)   . Finger injury    cut pads off 3rd and 4th finger left hand/due to lawn mower accident  . GERD (gastroesophageal reflux disease)   . Heart murmur    as child only-   . History of kidney  stones   . HTN (hypertension) 08/20/2016  . Hyperlipidemia   . Incomplete right bundle branch block   . Prostate cancer ([place]) dx 10/02/14   stage T1c  . Prostate cancer ([place])   . Sigmoid diverticulosis   . Wears glasses     ALLERGIES:  is allergic to bee venom, elemental sulfur, sulfa antibiotics, and namenda [memantine].  MEDICATIONS:  Current Outpatient Medications  Medication Sig Dispense Refill  . acetaminophen (TYLENOL) 325 MG tablet Take 2 tablets (650 mg total) by mouth every 6 (six) hours as needed for mild pain (or Fever >/= 101). 30 tablet 0  . aspirin EC 81 MG tablet Take 81 mg by mouth daily. Swallow whole.    . Budeson-Glycopyrrol-Formoterol (BREZTRI AEROSPHERE) 160-9-4.8 MCG/ACT AERO Inhale 2 puffs into the lungs in the morning and at bedtime. 10.7 g 2  . busPIRone (BUSPAR) 10 MG tablet TAKE 1 TABLET BY MOUTH TWICE A DAY 180 tablet 3  . lidocaine-prilocaine (EMLA) cream Apply 1 application topically as needed. 30 g 2  . meloxicam (MOBIC) 15 MG tablet Take 15 mg by mouth daily.    . Multiple Vitamin (MULTIVITAMIN) capsule Take 1 capsule by mouth daily.    . pantoprazole (PROTONIX) 40 MG tablet TAKE 1 TABLET BY MOUTH DAILY BEFORE BREAKFAST 90 tablet 3  . prochlorperazine (COMPAZINE) 10 MG tablet Take 1 tablet (10 mg total) by mouth every 6 (six) hours  as needed. (Patient not taking: Reported on 11/28/2020) 30 tablet 2  . traMADol (ULTRAM) 50 MG tablet TAKE HALF TABLET BY MOUTH 2 TIMES DAILY AS NEEDED FOR PAIN 60 tablet 2  . triamcinolone cream (KENALOG) 0.5 % Apply 1 application topically 3 (three) times daily. 90 g 1  . umeclidinium-vilanterol (ANORO ELLIPTA) 62.5-25 MCG/INH AEPB Inhale 1 puff into the lungs daily. 60 each 2   No current facility-administered medications for this visit.    SURGICAL HISTORY:  Past Surgical History:  Procedure Laterality Date  . BRONCHIAL BIOPSY  04/12/2020   Procedure: BRONCHIAL BIOPSIES;  Surgeon: Marshell Garfinkel, MD;  Location: [facility];  Service: Cardiopulmonary;;  . BRONCHIAL BRUSHINGS  04/12/2020   Procedure: BRONCHIAL BRUSHINGS;  Surgeon: Marshell Garfinkel, MD;  Location: [facility];  Service: Cardiopulmonary;;  . BRONCHIAL NEEDLE ASPIRATION BIOPSY  04/12/2020   Procedure: BRONCHIAL NEEDLE ASPIRATION BIOPSIES;  Surgeon: Marshell Garfinkel, MD;  Location: [facility];  Service: Cardiopulmonary;;  . BRONCHIAL WASHINGS  04/12/2020   Procedure: BRONCHIAL WASHINGS;  Surgeon: Marshell Garfinkel, MD;  Location: [facility];  Service: Cardiopulmonary;;  . COLONOSCOPY    . COLONOSCOPY W/ POLYPECTOMY  04-19-2012  . ENDOBRONCHIAL ULTRASOUND N/A 04/12/2020   Procedure: ENDOBRONCHIAL ULTRASOUND;  Surgeon: Marshell Garfinkel, MD;  Location: [facility];  Service: Cardiopulmonary;  Laterality: N/A;  . ESOPHAGOGASTRODUODENOSCOPY  08-05-2010  . HEMOSTASIS CONTROL  04/12/2020   Procedure: HEMOSTASIS CONTROL;  Surgeon: Marshell Garfinkel, MD;  Location: [facility];  Service: Cardiopulmonary;;  . IR IMAGING GUIDED PORT INSERTION  05/31/2020  . POLYPECTOMY    . PROSTATE BIOPSY  10/02/14  . RADIOACTIVE SEED IMPLANT N/A 01/30/2015   Procedure: RADIOACTIVE SEED IMPLANT;  Surgeon: Rana Snare, MD;  Location: [facility];  Service: Urology;  Laterality: N/A;  . UPPER GASTROINTESTINAL ENDOSCOPY    . VIDEO BRONCHOSCOPY N/A 04/12/2020   Procedure: VIDEO BRONCHOSCOPY WITHOUT FLUORO;  Surgeon: Marshell Garfinkel, MD;  Location: [facility];  Service: Cardiopulmonary;  Laterality: N/A;    REVIEW OF SYSTEMS:  A comprehensive review of systems was negative.   PHYSICAL EXAMINATION: General appearance: alert, cooperative and no distress Head: Normocephalic, without obvious abnormality, atraumatic Neck: no adenopathy, no JVD, supple, symmetrical, trachea midline and thyroid not enlarged, symmetric, no tenderness/mass/nodules Lymph nodes: Cervical, supraclavicular, and axillary nodes normal. Resp: clear to auscultation bilaterally Back:  symmetric, no curvature. ROM normal. No CVA tenderness. Cardio: regular rate and rhythm, S1, S2 normal, no murmur, click, rub or gallop GI: soft, non-tender; bowel sounds normal; no masses,  no organomegaly Extremities: extremities normal, atraumatic, no cyanosis or edema  ECOG PERFORMANCE STATUS: 1 - Symptomatic but completely ambulatory  Blood pressure 106/79, pulse 87, temperature 97.9 F (36.6 C), temperature source Tympanic, resp. rate 14, height 6' (1.829 m), weight 151 lb (68.5 kg), SpO2 100 %.  LABORATORY DATA: Lab Results  Component Value Date   WBC 4.3 12/25/2020   HGB 14.1 12/25/2020   HCT 41.4 12/25/2020   MCV 109.5 (H) 12/25/2020   PLT 188 12/25/2020      Chemistry      Component Value Date/Time   NA 138 11/28/2020 1115   K 3.7 11/28/2020 1115   CL 104 11/28/2020 1115   CO2 26 11/28/2020 1115   BUN 9 11/28/2020 1115   CREATININE 0.81 11/28/2020 1115      Component Value Date/Time   CALCIUM 9.4 11/28/2020 1115   ALKPHOS 88 11/28/2020 1115   AST 41 11/28/2020 1115   ALT 40 11/28/2020 1115  BILITOT 0.5 11/28/2020 1115       RADIOGRAPHIC STUDIES: No results found.  ASSESSMENT AND PLAN: This is a very pleasant 60 years old white male recently diagnosed with extensive stage small cell lung cancer presented with left upper lobe lung mass in addition to left hilar and mediastinal lymphadenopathy as well as solitary brain metastasis diagnosed in May 2021. He underwent palliative radiotherapy to the left hilar and mediastinal lymphadenopathy in addition to the radiotherapy to the brain metastasis.  He continues to have residual odynophagia and dysphagia from the palliative radiotherapy. He is currently undergoing systemic chemotherapy with carboplatin for AUC of 5 on day 1, etoposide 100 mg/M2 on days 1, 2 and 3 with Cosela infusion before chemotherapy.  He is status post 5 cycles of treatment  Starting from cycle #5 the patient is on treatment with maintenance  Imfinzi every 4 weeks status post 3 cycles.  The patient continues to tolerate his maintenance treatment with Imfinzi fairly well. I recommended for him to proceed with cycle #4 of the maintenance therapy today as planned. I will see him back for follow-up visit in 4 weeks for evaluation after repeating CT scan of the chest, abdomen and pelvis for restaging of his disease. The patient was advised to call immediately if he has any other concerning symptoms in the interval. The patient voices understanding of current disease status and treatment options and is in agreement with the current care plan.  All questions were answered. The patient knows to call the clinic with any problems, questions or concerns. We can certainly see the patient much sooner if necessary.   Disclaimer: This note was dictated with voice recognition software. Similar sounding words can inadvertently be transcribed and may not be corrected upon review.

## 2020-12-27 ENCOUNTER — Other Ambulatory Visit: Payer: Self-pay | Admitting: Radiation Therapy

## 2020-12-27 ENCOUNTER — Telehealth: Payer: Self-pay | Admitting: Internal Medicine

## 2020-12-27 DIAGNOSIS — C7949 Secondary malignant neoplasm of other parts of nervous system: Secondary | ICD-10-CM

## 2020-12-27 DIAGNOSIS — C7931 Secondary malignant neoplasm of brain: Secondary | ICD-10-CM

## 2020-12-27 NOTE — Telephone Encounter (Signed)
Per 2/2 los, next appt already scheduled

## 2021-01-10 ENCOUNTER — Encounter: Payer: Self-pay | Admitting: Internal Medicine

## 2021-01-14 ENCOUNTER — Telehealth: Payer: Self-pay | Admitting: Radiation Therapy

## 2021-01-14 NOTE — Telephone Encounter (Signed)
Left a detailed voicemail about his upcoming brain MRI and virtual follow-up visit in March. Included my contact information for him to call back with questions or concerns.   Mont Dutton R.T.(R)(T) Radiation Special Procedures Navigator

## 2021-01-15 ENCOUNTER — Encounter: Payer: Self-pay | Admitting: General Practice

## 2021-01-20 ENCOUNTER — Ambulatory Visit (HOSPITAL_COMMUNITY)
Admission: RE | Admit: 2021-01-20 | Discharge: 2021-01-20 | Disposition: A | Discharge status: Home / Self Care | Payer: 59 | Source: Ambulatory Visit | Attending: Internal Medicine | Admitting: Internal Medicine

## 2021-01-20 ENCOUNTER — Other Ambulatory Visit: Payer: Self-pay

## 2021-01-20 ENCOUNTER — Encounter (HOSPITAL_COMMUNITY): Payer: Self-pay

## 2021-01-20 DIAGNOSIS — C3490 Malignant neoplasm of unspecified part of unspecified bronchus or lung: Secondary | ICD-10-CM | POA: Diagnosis not present

## 2021-01-20 MED ORDER — IOHEXOL 300 MG/ML  SOLN
100.0000 mL | Freq: Once | INTRAMUSCULAR | Status: AC | PRN
  Administered 2021-01-20: 100 mL via INTRAVENOUS

## 2021-01-20 MED ORDER — IOHEXOL 9 MG/ML PO SOLN
ORAL | Status: AC
  Filled 2021-01-20: qty 1000

## 2021-01-20 MED ORDER — IOHEXOL 9 MG/ML PO SOLN
1000.0000 mL | ORAL | Status: AC
  Administered 2021-01-20: 1000 mL via ORAL

## 2021-01-20 MED ORDER — HEPARIN SOD (PORK) LOCK FLUSH 100 UNIT/ML IV SOLN
INTRAVENOUS | Status: AC
  Filled 2021-01-20: qty 5

## 2021-01-20 MED ORDER — HEPARIN SOD (PORK) LOCK FLUSH 100 UNIT/ML IV SOLN
500.0000 [IU] | Freq: Once | INTRAVENOUS | Status: AC
  Administered 2021-01-20: 500 [IU] via INTRAVENOUS

## 2021-01-22 ENCOUNTER — Other Ambulatory Visit: Payer: 59

## 2021-01-22 ENCOUNTER — Other Ambulatory Visit: Payer: Self-pay

## 2021-01-22 ENCOUNTER — Ambulatory Visit: Payer: 59 | Admitting: Internal Medicine

## 2021-01-22 ENCOUNTER — Inpatient Hospital Stay: Payer: 59 | Attending: Internal Medicine

## 2021-01-22 ENCOUNTER — Inpatient Hospital Stay (HOSPITAL_BASED_OUTPATIENT_CLINIC_OR_DEPARTMENT_OTHER): Payer: 59 | Admitting: Internal Medicine

## 2021-01-22 ENCOUNTER — Inpatient Hospital Stay: Payer: 59

## 2021-01-22 VITALS — BP 108/82 | HR 86 | Temp 98.0°F | Resp 17 | Ht 72.0 in | Wt 150.4 lb

## 2021-01-22 DIAGNOSIS — Z79899 Other long term (current) drug therapy: Secondary | ICD-10-CM | POA: Insufficient documentation

## 2021-01-22 DIAGNOSIS — Z8546 Personal history of malignant neoplasm of prostate: Secondary | ICD-10-CM | POA: Insufficient documentation

## 2021-01-22 DIAGNOSIS — C7931 Secondary malignant neoplasm of brain: Secondary | ICD-10-CM | POA: Insufficient documentation

## 2021-01-22 DIAGNOSIS — I10 Essential (primary) hypertension: Secondary | ICD-10-CM | POA: Insufficient documentation

## 2021-01-22 DIAGNOSIS — C3412 Malignant neoplasm of upper lobe, left bronchus or lung: Secondary | ICD-10-CM

## 2021-01-22 DIAGNOSIS — Z923 Personal history of irradiation: Secondary | ICD-10-CM | POA: Insufficient documentation

## 2021-01-22 DIAGNOSIS — Z7982 Long term (current) use of aspirin: Secondary | ICD-10-CM | POA: Insufficient documentation

## 2021-01-22 DIAGNOSIS — Z5112 Encounter for antineoplastic immunotherapy: Secondary | ICD-10-CM | POA: Diagnosis not present

## 2021-01-22 DIAGNOSIS — Z95828 Presence of other vascular implants and grafts: Secondary | ICD-10-CM

## 2021-01-22 LAB — CMP (CANCER CENTER ONLY)
ALT: 62 U/L — ABNORMAL HIGH (ref 0–44)
AST: 63 U/L — ABNORMAL HIGH (ref 15–41)
Albumin: 3.8 g/dL (ref 3.5–5.0)
Alkaline Phosphatase: 77 U/L (ref 38–126)
Anion gap: 10 (ref 5–15)
BUN: 9 mg/dL (ref 6–20)
CO2: 26 mmol/L (ref 22–32)
Calcium: 9.5 mg/dL (ref 8.9–10.3)
Chloride: 103 mmol/L (ref 98–111)
Creatinine: 0.89 mg/dL (ref 0.61–1.24)
GFR, Estimated: >60 mL/min (ref >60)
Glucose, Bld: 101 mg/dL — ABNORMAL HIGH (ref 70–99)
Potassium: 3.8 mmol/L (ref 3.5–5.1)
Sodium: 139 mmol/L (ref 135–145)
Total Bilirubin: 0.3 mg/dL (ref 0.3–1.2)
Total Protein: 7.6 g/dL (ref 6.5–8.1)

## 2021-01-22 LAB — CBC WITH DIFFERENTIAL (CANCER CENTER ONLY)
Abs Immature Granulocytes: 0.02 10*3/uL (ref 0.00–0.07)
Basophils Absolute: 0 10*3/uL (ref 0.0–0.1)
Basophils Relative: 0 %
Eosinophils Absolute: 0.2 10*3/uL (ref 0.0–0.5)
Eosinophils Relative: 5 %
HCT: 42.3 % (ref 39.0–52.0)
Hemoglobin: 14.8 g/dL (ref 13.0–17.0)
Immature Granulocytes: 0 %
Lymphocytes Relative: 14 %
Lymphs Abs: 0.6 10*3/uL — ABNORMAL LOW (ref 0.7–4.0)
MCH: 36.7 pg — ABNORMAL HIGH (ref 26.0–34.0)
MCHC: 35 g/dL (ref 30.0–36.0)
MCV: 105 fL — ABNORMAL HIGH (ref 80.0–100.0)
Monocytes Absolute: 0.4 10*3/uL (ref 0.1–1.0)
Monocytes Relative: 10 %
Neutro Abs: 3.2 10*3/uL (ref 1.7–7.7)
Neutrophils Relative %: 71 %
Platelet Count: 162 10*3/uL (ref 150–400)
RBC: 4.03 MIL/uL — ABNORMAL LOW (ref 4.22–5.81)
RDW: 13.2 % (ref 11.5–15.5)
WBC Count: 4.5 10*3/uL (ref 4.0–10.5)
nRBC: 0 % (ref 0.0–0.2)

## 2021-01-22 LAB — TSH: TSH: 0.197 u[IU]/mL — ABNORMAL LOW (ref 0.320–4.118)

## 2021-01-22 MED ORDER — HEPARIN SOD (PORK) LOCK FLUSH 100 UNIT/ML IV SOLN
500.0000 [IU] | Freq: Once | INTRAVENOUS | Status: AC | PRN
  Administered 2021-01-22: 500 [IU]
  Filled 2021-01-22: qty 5

## 2021-01-22 MED ORDER — SODIUM CHLORIDE 0.9% FLUSH
10.0000 mL | Freq: Once | INTRAVENOUS | Status: AC
  Administered 2021-01-22: 10 mL
  Filled 2021-01-22: qty 10

## 2021-01-22 MED ORDER — SODIUM CHLORIDE 0.9 % IV SOLN
Freq: Once | INTRAVENOUS | Status: AC
  Filled 2021-01-22: qty 250

## 2021-01-22 MED ORDER — DURVALUMAB 500 MG/10ML IV SOLN
1500.0000 mg | Freq: Once | INTRAVENOUS | Status: AC
Start: 2021-01-22 — End: 2021-01-22
  Administered 2021-01-22: 1500 mg via INTRAVENOUS
  Filled 2021-01-22: qty 30

## 2021-01-22 MED ORDER — SODIUM CHLORIDE 0.9% FLUSH
10.0000 mL | INTRAVENOUS | Status: DC | PRN
  Administered 2021-01-22: 10 mL
  Filled 2021-01-22: qty 10

## 2021-01-22 NOTE — Patient Instructions (Signed)
[facility] Discharge Instructions for Patients Receiving Chemotherapy  Today you received the following chemotherapy agents Durvalumab (IMFINZI).  To help prevent nausea and vomiting after your treatment, we encourage you to take your nausea medication as prescribed.   If you develop nausea and vomiting that is not controlled by your nausea medication, call the clinic.   BELOW ARE SYMPTOMS THAT SHOULD BE REPORTED IMMEDIATELY:  *FEVER GREATER THAN 100.5 F  *CHILLS WITH OR WITHOUT FEVER  NAUSEA AND VOMITING THAT IS NOT CONTROLLED WITH YOUR NAUSEA MEDICATION  *UNUSUAL SHORTNESS OF BREATH  *UNUSUAL BRUISING OR BLEEDING  TENDERNESS IN MOUTH AND THROAT WITH OR WITHOUT PRESENCE OF ULCERS  *URINARY PROBLEMS  *BOWEL PROBLEMS  UNUSUAL RASH Items with * indicate a potential emergency and should be followed up as soon as possible.  Feel free to call the clinic should you have any questions or concerns. The clinic phone number is [phone].  Please show the [place] at check-in to the Emergency Department and triage nurse.

## 2021-01-22 NOTE — Patient Instructions (Signed)
Implanted Port Insertion, Care After This sheet gives you information about how to care for yourself after your procedure. Your health care provider may also give you more specific instructions. If you have problems or questions, contact your health care provider. What can I expect after the procedure? After the procedure, it is common to have:  Discomfort at the port insertion site.  Bruising on the skin over the port. This should improve over 3-4 days. Follow these instructions at home: Port care  After your port is placed, you will get a manufacturer's information card. The card has information about your port. Keep this card with you at all times.  Take care of the port as told by your health care provider. Ask your health care provider if you or a family member can get training for taking care of the port at home. A home health care nurse may also take care of the port.  Make sure to remember what type of port you have. Incision care  Follow instructions from your health care provider about how to take care of your port insertion site. Make sure you: ? Wash your hands with soap and water before and after you change your bandage (dressing). If soap and water are not available, use hand sanitizer. ? Change your dressing as told by your health care provider. ? Leave stitches (sutures), skin glue, or adhesive strips in place. These skin closures may need to stay in place for 2 weeks or longer. If adhesive strip edges start to loosen and curl up, you may trim the loose edges. Do not remove adhesive strips completely unless your health care provider tells you to do that.  Check your port insertion site every day for signs of infection. Check for: ? Redness, swelling, or pain. ? Fluid or blood. ? Warmth. ? Pus or a bad smell.      Activity  Return to your normal activities as told by your health care provider. Ask your health care provider what activities are safe for you.  Do not  lift anything that is heavier than 10 lb (4.5 kg), or the limit that you are told, until your health care provider says that it is safe. General instructions  Take over-the-counter and prescription medicines only as told by your health care provider.  Do not take baths, swim, or use a hot tub until your health care provider approves. Ask your health care provider if you may take showers. You may only be allowed to take sponge baths.  Do not drive for 24 hours if you were given a sedative during your procedure.  Wear a medical alert bracelet in case of an emergency. This will tell any health care providers that you have a port.  Keep all follow-up visits as told by your health care provider. This is important. Contact a health care provider if:  You cannot flush your port with saline as directed, or you cannot draw blood from the port.  You have a fever or chills.  You have redness, swelling, or pain around your port insertion site.  You have fluid or blood coming from your port insertion site.  Your port insertion site feels warm to the touch.  You have pus or a bad smell coming from the port insertion site. Get help right away if:  You have chest pain or shortness of breath.  You have bleeding from your port that you cannot control. Summary  Take care of the port as told by your   health care provider. Keep the manufacturer's information card with you at all times.  Change your dressing as told by your health care provider.  Contact a health care provider if you have a fever or chills or if you have redness, swelling, or pain around your port insertion site.  Keep all follow-up visits as told by your health care provider. This information is not intended to replace advice given to you by your health care provider. Make sure you discuss any questions you have with your health care provider. Document Revised: 06/07/2018 Document Reviewed: 06/07/2018 Elsevier Patient Education   2021 [employer]  

## 2021-01-22 NOTE — Progress Notes (Signed)
[facility] Telephone:([phone]) [phone]   [phone]) [phone]  OFFICE PROGRESS NOTE  [name], MD [place] [address]  DIAGNOSIS: Extensive stage (T2b, N2, M1c) small cell lung cancer presented with left upper lobe pulmonary nodule in addition to left hilar mass with mediastinal invasion and solitary brain metastasis diagnosed in May 2021  PRIOR THERAPY: Palliative radiation to the left hilar and mediastinal lymphadenopathy under the care of Dr. Lisbeth Renshaw.  CURRENT THERAPY:  Palliative systemic chemotherapy with carboplatin for AUC of 5 on day 1, etoposide 100 mg/M2 on days 1, 2 and 3 as well as Imfinzi 1500 mg IV every 3 weeks with the chemotherapy.  First dose of chemotherapy May 06, 2020.  The patient will receive treatment with Cosela on the days of his chemotherapy.  Status post 5 cycles.  Starting from cycle #5 he will be.  He did wear his maintenance immunotherapy with Imfinzi 1500 mg IV every 4 weeks. Status post 4 cycles.   INTERVAL HISTORY: Richard Santos 60 y.o. male returns to the clinic today for follow-up visit accompanied by his wife.  The patient is feeling fine today with no concerning complaints.  He denied having any current chest pain, shortness of breath, cough or hemoptysis.  He denied having any fever or chills.  He has no nausea, vomiting, diarrhea or constipation.  He has no headache or visual changes.  He travels to [place] a lot recently to see his grandchildren and also for vacations.  The patient had repeat CT scan of the chest, abdomen pelvis performed recently and he is here for evaluation and discussion of his scan results and treatment options.   MEDICAL HISTORY: Past Medical History:  Diagnosis Date  . Allergic rhinitis   . Allergy   . Anxiety   . Chronic low back pain   . DDD (degenerative disc disease), lumbar   . ED (erectile dysfunction)   . Finger injury    cut pads off 3rd and 4th finger left hand/due to lawn  mower accident  . GERD (gastroesophageal reflux disease)   . Heart murmur    as child only-   . History of kidney stones   . HTN (hypertension) 08/20/2016  . Hyperlipidemia   . Incomplete right bundle branch block   . Prostate cancer ([place]) dx 10/02/14   stage T1c  . Prostate cancer ([facility])   . Sigmoid diverticulosis   . Wears glasses     ALLERGIES:  is allergic to bee venom, elemental sulfur, sulfa antibiotics, and namenda [memantine].  MEDICATIONS:  Current Outpatient Medications  Medication Sig Dispense Refill  . acetaminophen (TYLENOL) 325 MG tablet Take 2 tablets (650 mg total) by mouth every 6 (six) hours as needed for mild pain (or Fever >/= 101). 30 tablet 0  . aspirin EC 81 MG tablet Take 81 mg by mouth daily. Swallow whole.    . Budeson-Glycopyrrol-Formoterol (BREZTRI AEROSPHERE) 160-9-4.8 MCG/ACT AERO Inhale 2 puffs into the lungs in the morning and at bedtime. 10.7 g 2  . busPIRone (BUSPAR) 10 MG tablet TAKE 1 TABLET BY MOUTH TWICE A DAY 180 tablet 3  . lidocaine-prilocaine (EMLA) cream Apply 1 application topically as needed. 30 g 2  . meloxicam (MOBIC) 15 MG tablet Take 15 mg by mouth daily.    . Multiple Vitamin (MULTIVITAMIN) capsule Take 1 capsule by mouth daily.    . ondansetron (ZOFRAN-ODT) 8 MG disintegrating tablet Take by mouth.    Marland Kitchen  pantoprazole (PROTONIX) 40 MG tablet TAKE 1 TABLET BY MOUTH DAILY BEFORE BREAKFAST 90 tablet 3  . prochlorperazine (COMPAZINE) 10 MG tablet Take 1 tablet (10 mg total) by mouth every 6 (six) hours as needed. 30 tablet 2  . traMADol (ULTRAM) 50 MG tablet TAKE HALF TABLET BY MOUTH 2 TIMES DAILY AS NEEDED FOR PAIN 60 tablet 2  . triamcinolone cream (KENALOG) 0.5 % Apply 1 application topically 3 (three) times daily. 90 g 1  . umeclidinium-vilanterol (ANORO ELLIPTA) 62.5-25 MCG/INH AEPB Inhale 1 puff into the lungs daily. 60 each 2   No current facility-administered medications for this visit.    SURGICAL HISTORY:  Past Surgical  History:  Procedure Laterality Date  . BRONCHIAL BIOPSY  04/12/2020   Procedure: BRONCHIAL BIOPSIES;  Surgeon: Marshell Garfinkel, MD;  Location: [facility];  Service: Cardiopulmonary;;  . BRONCHIAL BRUSHINGS  04/12/2020   Procedure: BRONCHIAL BRUSHINGS;  Surgeon: Marshell Garfinkel, MD;  Location: [facility];  Service: Cardiopulmonary;;  . BRONCHIAL NEEDLE ASPIRATION BIOPSY  04/12/2020   Procedure: BRONCHIAL NEEDLE ASPIRATION BIOPSIES;  Surgeon: Marshell Garfinkel, MD;  Location: [facility];  Service: Cardiopulmonary;;  . BRONCHIAL WASHINGS  04/12/2020   Procedure: BRONCHIAL WASHINGS;  Surgeon: Marshell Garfinkel, MD;  Location: [facility];  Service: Cardiopulmonary;;  . COLONOSCOPY    . COLONOSCOPY W/ POLYPECTOMY  04-19-2012  . ENDOBRONCHIAL ULTRASOUND N/A 04/12/2020   Procedure: ENDOBRONCHIAL ULTRASOUND;  Surgeon: Marshell Garfinkel, MD;  Location: [facility];  Service: Cardiopulmonary;  Laterality: N/A;  . ESOPHAGOGASTRODUODENOSCOPY  08-05-2010  . HEMOSTASIS CONTROL  04/12/2020   Procedure: HEMOSTASIS CONTROL;  Surgeon: Marshell Garfinkel, MD;  Location: [facility];  Service: Cardiopulmonary;;  . IR IMAGING GUIDED PORT INSERTION  05/31/2020  . POLYPECTOMY    . PROSTATE BIOPSY  10/02/14  . RADIOACTIVE SEED IMPLANT N/A 01/30/2015   Procedure: RADIOACTIVE SEED IMPLANT;  Surgeon: Rana Snare, MD;  Location: [facility];  Service: Urology;  Laterality: N/A;  . UPPER GASTROINTESTINAL ENDOSCOPY    . VIDEO BRONCHOSCOPY N/A 04/12/2020   Procedure: VIDEO BRONCHOSCOPY WITHOUT FLUORO;  Surgeon: Marshell Garfinkel, MD;  Location: [facility];  Service: Cardiopulmonary;  Laterality: N/A;    REVIEW OF SYSTEMS:  Constitutional: negative Eyes: negative Ears, nose, mouth, throat, and face: negative Respiratory: negative Cardiovascular: negative Gastrointestinal: negative Genitourinary:negative Integument/breast: negative Hematologic/lymphatic: negative Musculoskeletal:negative Neurological:  negative Behavioral/Psych: negative Endocrine: negative Allergic/Immunologic: negative   PHYSICAL EXAMINATION: General appearance: alert, cooperative and no distress Head: Normocephalic, without obvious abnormality, atraumatic Neck: no adenopathy, no JVD, supple, symmetrical, trachea midline and thyroid not enlarged, symmetric, no tenderness/mass/nodules Lymph nodes: Cervical, supraclavicular, and axillary nodes normal. Resp: clear to auscultation bilaterally Back: symmetric, no curvature. ROM normal. No CVA tenderness. Cardio: regular rate and rhythm, S1, S2 normal, no murmur, click, rub or gallop GI: soft, non-tender; bowel sounds normal; no masses,  no organomegaly Extremities: extremities normal, atraumatic, no cyanosis or edema Neurologic: Alert and oriented X 3, normal strength and tone. Normal symmetric reflexes. Normal coordination and gait  ECOG PERFORMANCE STATUS: 1 - Symptomatic but completely ambulatory  Blood pressure 108/82, pulse 86, temperature 98 F (36.7 C), temperature source Tympanic, resp. rate 17, height 6' (1.829 m), weight 150 lb 6.4 oz (68.2 kg), SpO2 99 %.  LABORATORY DATA: Lab Results  Component Value Date   WBC 4.5 01/22/2021   HGB 14.8 01/22/2021   HCT 42.3 01/22/2021   MCV 105.0 (H) 01/22/2021   PLT 162 01/22/2021      Chemistry      Component  Value Date/Time   NA 142 12/25/2020 1200   K 3.9 12/25/2020 1200   CL 104 12/25/2020 1200   CO2 29 12/25/2020 1200   BUN 10 12/25/2020 1200   CREATININE 0.84 12/25/2020 1200      Component Value Date/Time   CALCIUM 9.2 12/25/2020 1200   ALKPHOS 81 12/25/2020 1200   AST 41 12/25/2020 1200   ALT 38 12/25/2020 1200   BILITOT 0.5 12/25/2020 1200       RADIOGRAPHIC STUDIES: CT Chest W Contrast  Result Date: 01/20/2021 CLINICAL DATA:  Restaging small cell lung cancer. Chemotherapy and radiation therapy completed. Immunotherapy ongoing. History of prostate cancer. Intermittent cough and nausea. EXAM:  CT CHEST, ABDOMEN, AND PELVIS WITH CONTRAST TECHNIQUE: Multidetector CT imaging of the chest, abdomen and pelvis was performed following the standard protocol during bolus administration of intravenous contrast. CONTRAST:  182mL OMNIPAQUE IOHEXOL 300 MG/ML  SOLN COMPARISON:  CTs 10/28/2020 and 08/05/2020. FINDINGS: CT CHEST FINDINGS Cardiovascular: Right IJ Port-A-Cath extends to the mid SVC level. The left upper lobe pulmonary artery appears chronically occluded. No acute vascular findings are seen. There is atherosclerosis of the aorta, great vessels and coronary arteries. The heart size is normal. There is no pericardial effusion. Mediastinum/Nodes: There are no enlarged mediastinal, hilar or axillary lymph nodes. Mildly improved soft tissue thickening about the left hilum. The thyroid gland, trachea and esophagus demonstrate no significant findings. Lungs/Pleura: No pleural effusion or pneumothorax. Peripheral left upper lobe nodule measures 2.2 x 1.1 cm on image 51/6 (previously 2.4 x 1.4 cm). Stable biapical scarring and mild emphysematous changes. Involving radiation changes medially in the left hemithorax with decreased size of previously demonstrated cavitary lesion in the lingula, now measuring 3.1 x 1.8 cm on image 76/6 (previously 4.3 x 3.8 cm). There is associated architectural distortion and bronchiectasis, but no evidence of local recurrence. The right lung remains clear. Musculoskeletal/Chest wall: No chest wall mass or suspicious osseous findings. CT ABDOMEN AND PELVIS FINDINGS Hepatobiliary: The liver is normal in density without suspicious focal abnormality. No evidence of gallstones, gallbladder wall thickening or biliary dilatation. Pancreas: Appears stable, without significant findings. No evidence of ductal dilatation or surrounding inflammation. Spleen: Normal in size without focal abnormality. Adrenals/Urinary Tract: New small left adrenal nodule, measuring 2.0 x 1.4 cm on image 71/2,  suspicious for metastasis. There is a new smaller component superiorly measuring 1.6 cm on image 68/2. The right adrenal gland appears normal. Stable nonobstructing calculus in the interpolar region of the left kidney and cyst anteriorly in the interpolar region of the right kidney. No evidence of renal mass, ureteral calculus or hydronephrosis. The bladder appears unremarkable for its degree of distention. Stomach/Bowel: The stomach appears unremarkable for its degree of distension. No evidence of bowel wall thickening, distention or surrounding inflammatory change. The appendix appears normal. Mild sigmoid colon diverticular changes. Vascular/Lymphatic: There are no enlarged abdominal or pelvic lymph nodes. Stable small lymph nodes in the porta hepatis. No acute vascular findings. There is aortic and branch vessel atherosclerosis without evidence of large vessel occlusion or aneurysm. The portal, superior mesenteric and splenic veins are patent. Reproductive: Stable appearance of the prostate gland post brachytherapy. Other: No ascites or peritoneal nodularity. Musculoskeletal: No acute or significant osseous findings. Stable degenerative disc disease at L5-S1. IMPRESSION: 1. Decreased size of left upper lobe pulmonary nodule consistent with response to therapy. No evidence of local recurrence or thoracic metastases. 2. Evolving radiation changes medially in the left hemithorax with decreased size of previously demonstrated  cavitary lesion in the lingula. 3. New small left adrenal nodules, suspicious for metastasis. No other significant changes in the abdomen or pelvis. 4. Aortic Atherosclerosis (ICD10-I70.0) and Emphysema (ICD10-J43.9). Electronically Signed   By: Richardean Sale M.D.   On: 01/20/2021 16:40   CT Abdomen Pelvis W Contrast  Result Date: 01/20/2021 CLINICAL DATA:  Restaging small cell lung cancer. Chemotherapy and radiation therapy completed. Immunotherapy ongoing. History of prostate cancer.  Intermittent cough and nausea. EXAM: CT CHEST, ABDOMEN, AND PELVIS WITH CONTRAST TECHNIQUE: Multidetector CT imaging of the chest, abdomen and pelvis was performed following the standard protocol during bolus administration of intravenous contrast. CONTRAST:  178mL OMNIPAQUE IOHEXOL 300 MG/ML  SOLN COMPARISON:  CTs 10/28/2020 and 08/05/2020. FINDINGS: CT CHEST FINDINGS Cardiovascular: Right IJ Port-A-Cath extends to the mid SVC level. The left upper lobe pulmonary artery appears chronically occluded. No acute vascular findings are seen. There is atherosclerosis of the aorta, great vessels and coronary arteries. The heart size is normal. There is no pericardial effusion. Mediastinum/Nodes: There are no enlarged mediastinal, hilar or axillary lymph nodes. Mildly improved soft tissue thickening about the left hilum. The thyroid gland, trachea and esophagus demonstrate no significant findings. Lungs/Pleura: No pleural effusion or pneumothorax. Peripheral left upper lobe nodule measures 2.2 x 1.1 cm on image 51/6 (previously 2.4 x 1.4 cm). Stable biapical scarring and mild emphysematous changes. Involving radiation changes medially in the left hemithorax with decreased size of previously demonstrated cavitary lesion in the lingula, now measuring 3.1 x 1.8 cm on image 76/6 (previously 4.3 x 3.8 cm). There is associated architectural distortion and bronchiectasis, but no evidence of local recurrence. The right lung remains clear. Musculoskeletal/Chest wall: No chest wall mass or suspicious osseous findings. CT ABDOMEN AND PELVIS FINDINGS Hepatobiliary: The liver is normal in density without suspicious focal abnormality. No evidence of gallstones, gallbladder wall thickening or biliary dilatation. Pancreas: Appears stable, without significant findings. No evidence of ductal dilatation or surrounding inflammation. Spleen: Normal in size without focal abnormality. Adrenals/Urinary Tract: New small left adrenal nodule,  measuring 2.0 x 1.4 cm on image 71/2, suspicious for metastasis. There is a new smaller component superiorly measuring 1.6 cm on image 68/2. The right adrenal gland appears normal. Stable nonobstructing calculus in the interpolar region of the left kidney and cyst anteriorly in the interpolar region of the right kidney. No evidence of renal mass, ureteral calculus or hydronephrosis. The bladder appears unremarkable for its degree of distention. Stomach/Bowel: The stomach appears unremarkable for its degree of distension. No evidence of bowel wall thickening, distention or surrounding inflammatory change. The appendix appears normal. Mild sigmoid colon diverticular changes. Vascular/Lymphatic: There are no enlarged abdominal or pelvic lymph nodes. Stable small lymph nodes in the porta hepatis. No acute vascular findings. There is aortic and branch vessel atherosclerosis without evidence of large vessel occlusion or aneurysm. The portal, superior mesenteric and splenic veins are patent. Reproductive: Stable appearance of the prostate gland post brachytherapy. Other: No ascites or peritoneal nodularity. Musculoskeletal: No acute or significant osseous findings. Stable degenerative disc disease at L5-S1. IMPRESSION: 1. Decreased size of left upper lobe pulmonary nodule consistent with response to therapy. No evidence of local recurrence or thoracic metastases. 2. Evolving radiation changes medially in the left hemithorax with decreased size of previously demonstrated cavitary lesion in the lingula. 3. New small left adrenal nodules, suspicious for metastasis. No other significant changes in the abdomen or pelvis. 4. Aortic Atherosclerosis (ICD10-I70.0) and Emphysema (ICD10-J43.9). Electronically Signed   By: Gwyndolyn Saxon  Lin Landsman M.D.   On: 01/20/2021 16:40    ASSESSMENT AND PLAN: This is a very pleasant 60 years old white male recently diagnosed with extensive stage small cell lung cancer presented with left upper lobe  lung mass in addition to left hilar and mediastinal lymphadenopathy as well as solitary brain metastasis diagnosed in May 2021. He underwent palliative radiotherapy to the left hilar and mediastinal lymphadenopathy in addition to the radiotherapy to the brain metastasis.  He continues to have residual odynophagia and dysphagia from the palliative radiotherapy. He is currently undergoing systemic chemotherapy with carboplatin for AUC of 5 on day 1, etoposide 100 mg/M2 on days 1, 2 and 3 with Cosela infusion before chemotherapy.  He is status post 5 cycles of treatment  Starting from cycle #5 the patient is on treatment with maintenance Imfinzi every 4 weeks status post 4 cycles.  The patient continues to tolerate this treatment well with no concerning adverse effects. He had repeat CT scan of the chest, abdomen pelvis performed recently.  I personally and independently reviewed the scans and discussed the results with the patient and his wife today. His scan showed further improvement of his disease except for the new small left adrenal nodule suspicious for metastasis.  I personally reviewed the scan images and the nodule in the adrenal gland is suspicious for metastasis but could be also result of immunotherapy. I recommended for the patient to continue his current treatment with immunotherapy with Imfinzi for now.  I will continue to monitor the left adrenal gland nodule closely and if it continues to increase in size, I will refer the patient to radiation oncology for consideration of palliative radiotherapy to that area. The patient will come back for follow-up visit in 4 weeks for evaluation before restarting cycle #6 of his maintenance treatment with Imfinzi. He was advised to call immediately if he has any other concerning symptoms in the interval. The patient voices understanding of current disease status and treatment options and is in agreement with the current care plan.  All questions were  answered. The patient knows to call the clinic with any problems, questions or concerns. We can certainly see the patient much sooner if necessary.   Disclaimer: This note was dictated with voice recognition software. Similar sounding words can inadvertently be transcribed and may not be corrected upon review.

## 2021-01-24 ENCOUNTER — Telehealth: Payer: Self-pay | Admitting: Internal Medicine

## 2021-01-24 NOTE — Telephone Encounter (Signed)
Scheduled per los. Called and left msg. Mailed printout  °

## 2021-02-05 ENCOUNTER — Ambulatory Visit: Payer: 59 | Admitting: Pulmonary Disease

## 2021-02-06 ENCOUNTER — Ambulatory Visit (HOSPITAL_COMMUNITY): Payer: 59

## 2021-02-10 ENCOUNTER — Telehealth: Payer: Self-pay

## 2021-02-10 ENCOUNTER — Encounter: Payer: Self-pay | Admitting: Radiation Oncology

## 2021-02-10 ENCOUNTER — Telehealth: Payer: Self-pay | Admitting: Radiation Oncology

## 2021-02-10 ENCOUNTER — Other Ambulatory Visit: Payer: Self-pay

## 2021-02-10 NOTE — Telephone Encounter (Signed)
Left patient voicemail message in regards to telephone visit with Shona Simpson PA on 02/17/21 @ 3:30pm. Called to review meaningful use questions. TM

## 2021-02-10 NOTE — Telephone Encounter (Signed)
Spoke with patient in regards to telephone appointment with Shona Simpson PA on 02/17/21 @ 3:30pm. Patient verbalized understanding that this is a telephone visit.  Reviewed meaningful use questions. TM

## 2021-02-11 ENCOUNTER — Ambulatory Visit (INDEPENDENT_AMBULATORY_CARE_PROVIDER_SITE_OTHER): Payer: 59 | Admitting: Pulmonary Disease

## 2021-02-11 ENCOUNTER — Encounter: Payer: Self-pay | Admitting: Pulmonary Disease

## 2021-02-11 DIAGNOSIS — J439 Emphysema, unspecified: Secondary | ICD-10-CM

## 2021-02-11 NOTE — Patient Instructions (Signed)
Stop anoro, start breztri inhaler Work on smoking cessation  Follow-up in 6 months

## 2021-02-11 NOTE — Progress Notes (Signed)
Richard Santos    [id number]    [birth year]  Primary Care Physician:John, Hunt Oris, MD  Referring Physician: [name], MD [address] [place],  [place] [address]  Problem list: Stage IV small cell lung cancer diagnosed in May 2021, status post chemoradiation.  On durvalumab COPD Active smoker  HPI: 60 year old smoker initially evaluated for lung mass status post bronchoscopy on 04/12/2020 with diagnosis of extensive stage small cell cancer.  Finished chemoradiation and is currently on immunotherapy under the care of Dr. Simon Rhein to smoke.  Has chronic dyspnea on exertion, chronic cough with white mucus  Pets: No pets Occupation: Adult nurse.  Currently on disability Exposures: Does have chemical exposures within his job but reports good ventilation Smoking history: 30-pack-year smoker, continues to smoke Travel history: No significant travel history Relevant family history: No significant family history of lung disease  Intermittently: Prescribed anoro at last visit.  This is currently not covered by insurance We have tried breztri earlier but was too expensive.  But now with new insurance orders costing him $40  Continues to smoke half pack per day Continues on immunotherapy with durvalumab with Dr. Julien Nordmann.  Recent CT shows regression of lung findings but a new adrenal lesion that is being monitored.  Outpatient Encounter Medications as of 02/11/2021  Medication Sig  . acetaminophen (TYLENOL) 325 MG tablet Take 2 tablets (650 mg total) by mouth every 6 (six) hours as needed for mild pain (or Fever >/= 101).  Marland Kitchen aspirin EC 81 MG tablet Take 81 mg by mouth daily. Swallow whole.  . Budeson-Glycopyrrol-Formoterol (BREZTRI AEROSPHERE) 160-9-4.8 MCG/ACT AERO Inhale 2 puffs into the lungs in the morning and at bedtime.  . busPIRone (BUSPAR) 10 MG tablet TAKE 1 TABLET BY MOUTH TWICE A DAY  . lidocaine-prilocaine (EMLA) cream Apply 1 application  topically as needed.  . meloxicam (MOBIC) 15 MG tablet Take 15 mg by mouth daily.  . Multiple Vitamin (MULTIVITAMIN) capsule Take 1 capsule by mouth daily.  . ondansetron (ZOFRAN-ODT) 8 MG disintegrating tablet Take by mouth.  . pantoprazole (PROTONIX) 40 MG tablet TAKE 1 TABLET BY MOUTH DAILY BEFORE BREAKFAST  . prochlorperazine (COMPAZINE) 10 MG tablet Take 1 tablet (10 mg total) by mouth every 6 (six) hours as needed.  . traMADol (ULTRAM) 50 MG tablet TAKE HALF TABLET BY MOUTH 2 TIMES DAILY AS NEEDED FOR PAIN  . triamcinolone cream (KENALOG) 0.5 % Apply 1 application topically 3 (three) times daily.  . [DISCONTINUED] umeclidinium-vilanterol (ANORO ELLIPTA) 62.5-25 MCG/INH AEPB Inhale 1 puff into the lungs daily. (Patient not taking: Reported on 02/11/2021)   No facility-administered encounter medications on file as of 02/11/2021.   Physical Exam: Blood pressure 118/72, pulse 98, temperature 98.2 F (36.8 C), temperature source Temporal, height 6' (1.829 m), weight 151 lb (68.5 kg), SpO2 98 %. Gen:      No acute distress HEENT:  EOMI, sclera anicteric Neck:     No masses; no thyromegaly Lungs:    Clear to auscultation bilaterally; normal respiratory effort CV:         Regular rate and rhythm; no murmurs Abd:      + bowel sounds; soft, non-tender; no palpable masses, no distension Ext:    No edema; adequate peripheral perfusion Skin:      Warm and dry; no rash Neuro: alert and oriented x 3 Psych: normal mood and affect  Data Reviewed: Imaging:  CT 01/20/2021-decrease in size of pulmonary  nodule, evolving radiation changes in the left lung with decrease size of The lesion and lingula, new small left adrenal nodules I have reviewed the images personally.  PFTs: 08/15/2020 FVC 3.83 [76%], FEV1 2.74 [72%], F/F 72, TLC 6.04 [83%], DLCO 23.60 [81%] Moderate obstruction  Labs: CBC 08/08/2020-WBC 6.1, eos 4%, absolute eosinophil count 244  Assessment:  Moderate COPD Change inhalers due  to insurance coverage Stop Anoro.  He wants to retry breztri again  Active smoker Smoking cessation encouraged.  Time spent counseling-5 minutes.  Reassess at return visit  Small cell lung cancer S/p chemoradiation.  Currently on immunotherapy with durvalumab Follows with Dr. Julien Nordmann  Plan/Recommendations: Stop anoro, start breztri inhaler Smoking cessation  Marshell Garfinkel MD [facility] 02/11/2021, 11:08 AM  CC: [name], MD

## 2021-02-13 ENCOUNTER — Other Ambulatory Visit: Payer: Self-pay

## 2021-02-13 ENCOUNTER — Ambulatory Visit (HOSPITAL_COMMUNITY)
Admission: RE | Admit: 2021-02-13 | Discharge: 2021-02-13 | Disposition: A | Discharge status: Home / Self Care | Payer: 59 | Source: Ambulatory Visit | Attending: Radiation Oncology | Admitting: Radiation Oncology

## 2021-02-13 DIAGNOSIS — C7931 Secondary malignant neoplasm of brain: Secondary | ICD-10-CM | POA: Diagnosis not present

## 2021-02-13 DIAGNOSIS — C7949 Secondary malignant neoplasm of other parts of nervous system: Secondary | ICD-10-CM | POA: Diagnosis present

## 2021-02-13 MED ORDER — HEPARIN SOD (PORK) LOCK FLUSH 100 UNIT/ML IV SOLN
500.0000 [IU] | INTRAVENOUS | Status: AC | PRN
  Administered 2021-02-13: 500 [IU]
  Filled 2021-02-13: qty 5

## 2021-02-17 ENCOUNTER — Inpatient Hospital Stay: Payer: 59

## 2021-02-17 ENCOUNTER — Ambulatory Visit
Admission: RE | Admit: 2021-02-17 | Discharge: 2021-02-17 | Disposition: A | Discharge status: Home / Self Care | Payer: 59 | Source: Ambulatory Visit | Attending: Radiation Oncology | Admitting: Radiation Oncology

## 2021-02-17 ENCOUNTER — Other Ambulatory Visit: Payer: Self-pay

## 2021-02-17 DIAGNOSIS — C7949 Secondary malignant neoplasm of other parts of nervous system: Secondary | ICD-10-CM

## 2021-02-17 DIAGNOSIS — C3412 Malignant neoplasm of upper lobe, left bronchus or lung: Secondary | ICD-10-CM

## 2021-02-17 DIAGNOSIS — C7931 Secondary malignant neoplasm of brain: Secondary | ICD-10-CM

## 2021-02-17 MED ORDER — GADOBUTROL 1 MMOL/ML IV SOLN
6.0000 mL | Freq: Once | INTRAVENOUS | Status: AC | PRN
  Administered 2021-02-17: 6 mL via INTRAVENOUS

## 2021-02-17 NOTE — Progress Notes (Signed)
Radiation Oncology         [phone]) [phone] ________________________________  Outpatient Follow Up - Conducted via telephone due to current COVID-19 concerns for limiting patient exposure  I spoke with the patient to conduct this consult visit via telephone to spare the patient unnecessary potential exposure in the healthcare setting during the current COVID-19 pandemic. The patient was notified in advance and was offered a [place] meeting to allow for face to face communication but unfortunately reported that they did not have the appropriate resources/technology to support such a visit and instead preferred to proceed with a telephone visit.  Name: BURLE KWAN        MRN: [id number]  Date of Service: 02/17/2021 DOB: [birth year]  RF:FMBW, Hunt Oris, MD  Curt Bears, MD     REFERRING PHYSICIAN: Curt Bears, MD   DIAGNOSIS: The primary encounter diagnosis was Secondary malignant neoplasm of brain and spinal cord [facility]). A diagnosis of Small cell lung cancer, left upper lobe (HCC) was also pertinent to this visit.   HISTORY OF PRESENT ILLNESS: LAWERENCE DERY is a 60 y.o. male  with a history of extensive stage small cell lung cancer.  The patient had a history of prostate cancer that was treated in 2016 with radioactive seed placement.  He was diagnosed with small cell carcinoma in May 2021 and received chemoradiation in the summer 2021, during his work-up he did have a 6 mm lesion in the brain along the cerebellum, however with repeat imaging on 08/03/2020, this was felt to be rather a subacute infarct and measured 4 mm.  He was counseled following the course of chemoradiation on prophylactic cranial irradiation and received this regimen in October 2021.  Of note he was unable to tolerate Namenda. He has been followed since without evidence of recurrent disease in the brain.  He also continues maintenance Imfinzi with Dr. Julien Nordmann, his last dose was on 01/22/2021.  He is contacted today to review  the results of his MRI of the brain which was performed on 02/13/2021, and revealed stable posttreatment appearance of the brain without evidence of new or active metastatic disease. He does have bilateral mastoid effusions.  He is contacted by phone to discuss these results.  PREVIOUS RADIATION THERAPY: Yes   08/26/20-09/06/20 Prophylactic Cranial Irradiation (PCI)Treatment: The patient's whole brain was treated to 25 Gy in 10 fractions   04/25/20 - 05/15/20: The patient was treated to the disease within the leftlung initially to a dose of 37.5Gy in 15 fractionsusing a73field, 3-D conformal technique.   2016:  The prostate was treated with radioactive seeds with the following prescription: Prostate 14,500 cGy.Isotope: I-125 utilizing 79 seeds and 29 active needles. Individual seed activity 0.47 mCi per seed for a total implant activity of 37.13 mCi.  PAST MEDICAL HISTORY:  Past Medical History:  Diagnosis Date  . Allergic rhinitis   . Allergy   . Anxiety   . Chronic low back pain   . DDD (degenerative disc disease), lumbar   . ED (erectile dysfunction)   . Finger injury    cut pads off 3rd and 4th finger left hand/due to lawn mower accident  . GERD (gastroesophageal reflux disease)   . Heart murmur    as child only-   . History of kidney stones   . HTN (hypertension) 08/20/2016  . Hyperlipidemia   . Incomplete right bundle branch block   . Prostate cancer ([place]) dx 10/02/14   stage T1c  . Prostate cancer ([place])   .  Sigmoid diverticulosis   . Wears glasses        PAST SURGICAL HISTORY: Past Surgical History:  Procedure Laterality Date  . BRONCHIAL BIOPSY  04/12/2020   Procedure: BRONCHIAL BIOPSIES;  Surgeon: Marshell Garfinkel, MD;  Location: [place];  Service: Cardiopulmonary;;  . BRONCHIAL BRUSHINGS  04/12/2020   Procedure: BRONCHIAL BRUSHINGS;  Surgeon: Marshell Garfinkel, MD;  Location: [place];  Service: Cardiopulmonary;;  . BRONCHIAL NEEDLE ASPIRATION BIOPSY   04/12/2020   Procedure: BRONCHIAL NEEDLE ASPIRATION BIOPSIES;  Surgeon: Marshell Garfinkel, MD;  Location: [place];  Service: Cardiopulmonary;;  . BRONCHIAL WASHINGS  04/12/2020   Procedure: BRONCHIAL WASHINGS;  Surgeon: Marshell Garfinkel, MD;  Location: [facility];  Service: Cardiopulmonary;;  . COLONOSCOPY    . COLONOSCOPY W/ POLYPECTOMY  04-19-2012  . ENDOBRONCHIAL ULTRASOUND N/A 04/12/2020   Procedure: ENDOBRONCHIAL ULTRASOUND;  Surgeon: Marshell Garfinkel, MD;  Location: [place];  Service: Cardiopulmonary;  Laterality: N/A;  . ESOPHAGOGASTRODUODENOSCOPY  08-05-2010  . HEMOSTASIS CONTROL  04/12/2020   Procedure: HEMOSTASIS CONTROL;  Surgeon: Marshell Garfinkel, MD;  Location: [place];  Service: Cardiopulmonary;;  . IR IMAGING GUIDED PORT INSERTION  05/31/2020  . POLYPECTOMY    . PROSTATE BIOPSY  10/02/14  . RADIOACTIVE SEED IMPLANT N/A 01/30/2015   Procedure: RADIOACTIVE SEED IMPLANT;  Surgeon: Rana Snare, MD;  Location[facility];  Service: Urology;  Laterality: N/A;  . UPPER GASTROINTESTINAL ENDOSCOPY    . VIDEO BRONCHOSCOPY N/A 04/12/2020   Procedure: VIDEO BRONCHOSCOPY WITHOUT FLUORO;  Surgeon: Marshell Garfinkel, MD;  Location: [place] [facility];  Service: Cardiopulmonary;  Laterality: N/A;     FAMILY HISTORY:  Family History  Problem Relation Age of Onset  . Prostate cancer Father   . Heart disease Father   . Heart disease Mother   . Breast cancer Sister   . Heart disease Other   . Colon cancer Neg Hx   . Colon polyps Neg Hx   . Esophageal cancer Neg Hx   . Rectal cancer Neg Hx   . Stomach cancer Neg Hx      SOCIAL HISTORY:  reports that he has been smoking cigarettes. He has a 25.00 pack-year smoking history. He has never used smokeless tobacco. He reports current alcohol use of about 49.0 standard drinks of alcohol per week. He reports that he does not use drugs. The patient is married and lives in [place]. He is self employed and owns a Doctor, hospital.   ALLERGIES: Bee venom, Elemental sulfur, Sulfa antibiotics, and Namenda [memantine]   MEDICATIONS:  Current Outpatient Medications  Medication Sig Dispense Refill  . acetaminophen (TYLENOL) 325 MG tablet Take 2 tablets (650 mg total) by mouth every 6 (six) hours as needed for mild pain (or Fever >/= 101). 30 tablet 0  . aspirin EC 81 MG tablet Take 81 mg by mouth daily. Swallow whole.    . Budeson-Glycopyrrol-Formoterol (BREZTRI AEROSPHERE) 160-9-4.8 MCG/ACT AERO Inhale 2 puffs into the lungs in the morning and at bedtime. 10.7 g 2  . busPIRone (BUSPAR) 10 MG tablet TAKE 1 TABLET BY MOUTH TWICE A DAY 180 tablet 3  . meloxicam (MOBIC) 15 MG tablet Take 15 mg by mouth daily.    . Multiple Vitamin (MULTIVITAMIN) capsule Take 1 capsule by mouth daily.    . ondansetron (ZOFRAN-ODT) 8 MG disintegrating tablet Take by mouth.    . pantoprazole (PROTONIX) 40 MG tablet TAKE 1 TABLET BY MOUTH DAILY BEFORE BREAKFAST 90 tablet 3  . prochlorperazine (COMPAZINE) 10 MG tablet Take  1 tablet (10 mg total) by mouth every 6 (six) hours as needed. 30 tablet 2  . traMADol (ULTRAM) 50 MG tablet TAKE HALF TABLET BY MOUTH 2 TIMES DAILY AS NEEDED FOR PAIN 60 tablet 2  . lidocaine-prilocaine (EMLA) cream Apply 1 application topically as needed. 30 g 2  . triamcinolone cream (KENALOG) 0.5 % Apply 1 application topically 3 (three) times daily. 90 g 1   No current facility-administered medications for this encounter.     REVIEW OF SYSTEMS: On review of systems, the patient reports that he is doing well overall.  In the past few months however he has been troubled by dysphagia.  We discussed his symptoms and he describes symptoms in the later part of the afternoon when he tries to swallow but things feel like they are getting stuck.  I looked back in 2016, the patient did develop dysphagia that prompted an upper endoscopy with Dr. Carlean Purl.  EGD was performed along with dilation of the esophagus as he had  numerous subtle rings and furrows in the esophagus, biopsies did not show any pathology and only benign squamous mucosa.  The patient reports that he is not having any headaches, nausea or vomiting, changes in vision or movement.  He is having ringing in his ears and at times feeling like his hearing is somewhat muffled.  He also describes taking Imodium several times a week as a result of his immunotherapy.  No other complaints are verbalized.  PHYSICAL EXAM:  Wt Readings from Last 3 Encounters:  02/11/21 151 lb (68.5 kg)  01/22/21 150 lb 6.4 oz (68.2 kg)  12/25/20 151 lb (68.5 kg)   Unable to assess due to encounter type.   ECOG = 0  0 - Asymptomatic (Fully active, able to carry on all predisease activities without restriction)  1 - Symptomatic but completely ambulatory (Restricted in physically strenuous activity but ambulatory and able to carry out work of a light or sedentary nature. For example, light housework, office work)  2 - Symptomatic, <50% in bed during the day (Ambulatory and capable of all self care but unable to carry out any work activities. Up and about more than 50% of waking hours)  3 - Symptomatic, >50% in bed, but not bedbound (Capable of only limited self-care, confined to bed or chair 50% or more of waking hours)  4 - Bedbound (Completely disabled. Cannot carry on any self-care. Totally confined to bed or chair)  5 - Death   Eustace Pen MM, Creech RH, Tormey DC, et al. [phone]). "Toxicity and response criteria of the [employer]". [place] Oncol. 5 (6): 649-55    LABORATORY DATA:  Lab Results  Component Value Date   WBC 4.5 01/22/2021   HGB 14.8 01/22/2021   HCT 42.3 01/22/2021   MCV 105.0 (H) 01/22/2021   PLT 162 01/22/2021   Lab Results  Component Value Date   NA 139 01/22/2021   K 3.8 01/22/2021   CL 103 01/22/2021   CO2 26 01/22/2021   Lab Results  Component Value Date   ALT 62 (H) 01/22/2021   AST 63 (H) 01/22/2021    ALKPHOS 77 01/22/2021   BILITOT 0.3 01/22/2021      RADIOGRAPHY: CT Chest W Contrast  Result Date: 01/20/2021 CLINICAL DATA:  Restaging small cell lung cancer. Chemotherapy and radiation therapy completed. Immunotherapy ongoing. History of prostate cancer. Intermittent cough and nausea. EXAM: CT CHEST, ABDOMEN, AND PELVIS WITH CONTRAST TECHNIQUE: Multidetector CT imaging of the  chest, abdomen and pelvis was performed following the standard protocol during bolus administration of intravenous contrast. CONTRAST:  169mL OMNIPAQUE IOHEXOL 300 MG/ML  SOLN COMPARISON:  CTs 10/28/2020 and 08/05/2020. FINDINGS: CT CHEST FINDINGS Cardiovascular: Right IJ Port-A-Cath extends to the mid SVC level. The left upper lobe pulmonary artery appears chronically occluded. No acute vascular findings are seen. There is atherosclerosis of the aorta, great vessels and coronary arteries. The heart size is normal. There is no pericardial effusion. Mediastinum/Nodes: There are no enlarged mediastinal, hilar or axillary lymph nodes. Mildly improved soft tissue thickening about the left hilum. The thyroid gland, trachea and esophagus demonstrate no significant findings. Lungs/Pleura: No pleural effusion or pneumothorax. Peripheral left upper lobe nodule measures 2.2 x 1.1 cm on image 51/6 (previously 2.4 x 1.4 cm). Stable biapical scarring and mild emphysematous changes. Involving radiation changes medially in the left hemithorax with decreased size of previously demonstrated cavitary lesion in the lingula, now measuring 3.1 x 1.8 cm on image 76/6 (previously 4.3 x 3.8 cm). There is associated architectural distortion and bronchiectasis, but no evidence of local recurrence. The right lung remains clear. Musculoskeletal/Chest wall: No chest wall mass or suspicious osseous findings. CT ABDOMEN AND PELVIS FINDINGS Hepatobiliary: The liver is normal in density without suspicious focal abnormality. No evidence of gallstones, gallbladder wall  thickening or biliary dilatation. Pancreas: Appears stable, without significant findings. No evidence of ductal dilatation or surrounding inflammation. Spleen: Normal in size without focal abnormality. Adrenals/Urinary Tract: New small left adrenal nodule, measuring 2.0 x 1.4 cm on image 71/2, suspicious for metastasis. There is a new smaller component superiorly measuring 1.6 cm on image 68/2. The right adrenal gland appears normal. Stable nonobstructing calculus in the interpolar region of the left kidney and cyst anteriorly in the interpolar region of the right kidney. No evidence of renal mass, ureteral calculus or hydronephrosis. The bladder appears unremarkable for its degree of distention. Stomach/Bowel: The stomach appears unremarkable for its degree of distension. No evidence of bowel wall thickening, distention or surrounding inflammatory change. The appendix appears normal. Mild sigmoid colon diverticular changes. Vascular/Lymphatic: There are no enlarged abdominal or pelvic lymph nodes. Stable small lymph nodes in the porta hepatis. No acute vascular findings. There is aortic and branch vessel atherosclerosis without evidence of large vessel occlusion or aneurysm. The portal, superior mesenteric and splenic veins are patent. Reproductive: Stable appearance of the prostate gland post brachytherapy. Other: No ascites or peritoneal nodularity. Musculoskeletal: No acute or significant osseous findings. Stable degenerative disc disease at L5-S1. IMPRESSION: 1. Decreased size of left upper lobe pulmonary nodule consistent with response to therapy. No evidence of local recurrence or thoracic metastases. 2. Evolving radiation changes medially in the left hemithorax with decreased size of previously demonstrated cavitary lesion in the lingula. 3. New small left adrenal nodules, suspicious for metastasis. No other significant changes in the abdomen or pelvis. 4. Aortic Atherosclerosis (ICD10-I70.0) and Emphysema  (ICD10-J43.9). Electronically Signed   By: Richardean Sale M.D.   On: 01/20/2021 16:40   MR Brain W Wo Contrast  Result Date: 02/17/2021 CLINICAL DATA:  Metastatic lung cancer post radiation, follow-up EXAM: MRI HEAD WITHOUT AND WITH CONTRAST TECHNIQUE: Multiplanar, multiecho pulse sequences of the brain and surrounding structures were obtained without and with intravenous contrast. CONTRAST:  41mL GADAVIST GADOBUTROL 1 MMOL/ML IV SOLN COMPARISON:  11/07/2020 FINDINGS: Brain: As before, there is a focus of susceptibility hypointensity in the inferior left cerebellum at the site of previously treated small metastasis. There remains no enhancement  at this site. There is no new mass or abnormal enhancement. There is no acute infarction or intracranial hemorrhage. There is no hydrocephalus or extra-axial fluid collection. Ventricles and sulci are stable in size and configuration. Patchy foci of T2 hyperintensity in the supratentorial white matter are nonspecific but probably reflects similar chronic microvascular ischemic changes. Vascular: Major vessel flow voids at the skull base are preserved. Skull and upper cervical spine: Normal marrow signal is preserved. Sinuses/Orbits: Minor mucosal thickening.  Orbits are unremarkable. Other: Sella is unremarkable. Persistent bilateral mastoid effusions. IMPRESSION: Stable post treatment appearance. No evidence of recurrent or new intracranial metastatic disease. Electronically Signed   By: Macy Mis M.D.   On: 02/17/2021 12:09   CT Abdomen Pelvis W Contrast  Result Date: 01/20/2021 CLINICAL DATA:  Restaging small cell lung cancer. Chemotherapy and radiation therapy completed. Immunotherapy ongoing. History of prostate cancer. Intermittent cough and nausea. EXAM: CT CHEST, ABDOMEN, AND PELVIS WITH CONTRAST TECHNIQUE: Multidetector CT imaging of the chest, abdomen and pelvis was performed following the standard protocol during bolus administration of intravenous  contrast. CONTRAST:  145mL OMNIPAQUE IOHEXOL 300 MG/ML  SOLN COMPARISON:  CTs 10/28/2020 and 08/05/2020. FINDINGS: CT CHEST FINDINGS Cardiovascular: Right IJ Port-A-Cath extends to the mid SVC level. The left upper lobe pulmonary artery appears chronically occluded. No acute vascular findings are seen. There is atherosclerosis of the aorta, great vessels and coronary arteries. The heart size is normal. There is no pericardial effusion. Mediastinum/Nodes: There are no enlarged mediastinal, hilar or axillary lymph nodes. Mildly improved soft tissue thickening about the left hilum. The thyroid gland, trachea and esophagus demonstrate no significant findings. Lungs/Pleura: No pleural effusion or pneumothorax. Peripheral left upper lobe nodule measures 2.2 x 1.1 cm on image 51/6 (previously 2.4 x 1.4 cm). Stable biapical scarring and mild emphysematous changes. Involving radiation changes medially in the left hemithorax with decreased size of previously demonstrated cavitary lesion in the lingula, now measuring 3.1 x 1.8 cm on image 76/6 (previously 4.3 x 3.8 cm). There is associated architectural distortion and bronchiectasis, but no evidence of local recurrence. The right lung remains clear. Musculoskeletal/Chest wall: No chest wall mass or suspicious osseous findings. CT ABDOMEN AND PELVIS FINDINGS Hepatobiliary: The liver is normal in density without suspicious focal abnormality. No evidence of gallstones, gallbladder wall thickening or biliary dilatation. Pancreas: Appears stable, without significant findings. No evidence of ductal dilatation or surrounding inflammation. Spleen: Normal in size without focal abnormality. Adrenals/Urinary Tract: New small left adrenal nodule, measuring 2.0 x 1.4 cm on image 71/2, suspicious for metastasis. There is a new smaller component superiorly measuring 1.6 cm on image 68/2. The right adrenal gland appears normal. Stable nonobstructing calculus in the interpolar region of the  left kidney and cyst anteriorly in the interpolar region of the right kidney. No evidence of renal mass, ureteral calculus or hydronephrosis. The bladder appears unremarkable for its degree of distention. Stomach/Bowel: The stomach appears unremarkable for its degree of distension. No evidence of bowel wall thickening, distention or surrounding inflammatory change. The appendix appears normal. Mild sigmoid colon diverticular changes. Vascular/Lymphatic: There are no enlarged abdominal or pelvic lymph nodes. Stable small lymph nodes in the porta hepatis. No acute vascular findings. There is aortic and branch vessel atherosclerosis without evidence of large vessel occlusion or aneurysm. The portal, superior mesenteric and splenic veins are patent. Reproductive: Stable appearance of the prostate gland post brachytherapy. Other: No ascites or peritoneal nodularity. Musculoskeletal: No acute or significant osseous findings. Stable degenerative disc disease  at L5-S1. IMPRESSION: 1. Decreased size of left upper lobe pulmonary nodule consistent with response to therapy. No evidence of local recurrence or thoracic metastases. 2. Evolving radiation changes medially in the left hemithorax with decreased size of previously demonstrated cavitary lesion in the lingula. 3. New small left adrenal nodules, suspicious for metastasis. No other significant changes in the abdomen or pelvis. 4. Aortic Atherosclerosis (ICD10-I70.0) and Emphysema (ICD10-J43.9). Electronically Signed   By: Richardean Sale M.D.   On: 01/20/2021 16:40       IMPRESSION/PLAN: 1. Extensive stage small cell lung cancer arising in the left lung.  Again the patient's disease burden has been to some degree difficult to quite classify because of the fact that the follow-up MRI called the area in the brain subacute infarct, we treated him as though this was not involved with PCI, and he did complete a course of systemic therapy more similar to limited stage  disease.  The patient is doing very well radiographically, this morning in conference his films were only available for review, there had not been a report generated but the report generated today agrees that there is stable posttreatment appearance without evidence of active disease in the brain.  He will continue 74-month MRI scans and also with maintenance immunotherapy with Dr. Julien Nordmann.  He was encouraged to call sooner if he has questions or concerns prior to his next visit. 2. Bilateral mastoid effusions.  We discussed the use of over-the-counter Sudafed to try and alleviate this and potential eustachian tube dysfunction that the patient may be having symptoms from.  He will notify me if his symptoms do not improve with this course. 3. Solid food dysphagia.  I encouraged the patient to follow-up with Dr. Carlean Purl as he previously had some Schatzki rings that were dilated several years ago.  The patient is in agreement, I will also reach out to Dr. Carlean Purl to notify him.  Given current concerns for patient exposure during the COVID-19 pandemic, this encounter was conducted via telephone.  The patient has provided two factor identification and has given verbal consent for this type of encounter and has been advised to only accept a meeting of this type in a secure network environment. The time spent during this encounter was 35 minutes including preparation, discussion, and coordination of the patient's care. The attendants for this meeting include  Hayden Pedro  and [name].  During the [place] was located at [facility] Radiation Oncology Department.  SASHA RUETH was located at home.     Carola Rhine, PAC

## 2021-02-19 ENCOUNTER — Ambulatory Visit: Payer: 59

## 2021-02-19 ENCOUNTER — Other Ambulatory Visit: Payer: 59

## 2021-02-19 ENCOUNTER — Inpatient Hospital Stay (HOSPITAL_BASED_OUTPATIENT_CLINIC_OR_DEPARTMENT_OTHER): Payer: 59 | Admitting: Internal Medicine

## 2021-02-19 ENCOUNTER — Inpatient Hospital Stay: Payer: 59

## 2021-02-19 ENCOUNTER — Encounter: Payer: Self-pay | Admitting: Internal Medicine

## 2021-02-19 ENCOUNTER — Other Ambulatory Visit: Payer: Self-pay

## 2021-02-19 ENCOUNTER — Telehealth: Payer: Self-pay

## 2021-02-19 ENCOUNTER — Ambulatory Visit: Payer: 59 | Admitting: Internal Medicine

## 2021-02-19 ENCOUNTER — Telehealth: Payer: Self-pay | Admitting: Internal Medicine

## 2021-02-19 VITALS — BP 111/74 | HR 93 | Temp 95.7°F | Resp 18 | Wt 150.0 lb

## 2021-02-19 DIAGNOSIS — R1310 Dysphagia, unspecified: Secondary | ICD-10-CM

## 2021-02-19 DIAGNOSIS — C3412 Malignant neoplasm of upper lobe, left bronchus or lung: Secondary | ICD-10-CM | POA: Diagnosis not present

## 2021-02-19 DIAGNOSIS — Z5112 Encounter for antineoplastic immunotherapy: Secondary | ICD-10-CM | POA: Diagnosis not present

## 2021-02-19 LAB — CMP (CANCER CENTER ONLY)
ALT: 62 U/L — ABNORMAL HIGH (ref 0–44)
AST: 61 U/L — ABNORMAL HIGH (ref 15–41)
Albumin: 4 g/dL (ref 3.5–5.0)
Alkaline Phosphatase: 88 U/L (ref 38–126)
Anion gap: 12 (ref 5–15)
BUN: 13 mg/dL (ref 6–20)
CO2: 26 mmol/L (ref 22–32)
Calcium: 9.2 mg/dL (ref 8.9–10.3)
Chloride: 101 mmol/L (ref 98–111)
Creatinine: 0.88 mg/dL (ref 0.61–1.24)
GFR, Estimated: >60 mL/min (ref >60)
Glucose, Bld: 150 mg/dL — ABNORMAL HIGH (ref 70–99)
Potassium: 3.8 mmol/L (ref 3.5–5.1)
Sodium: 139 mmol/L (ref 135–145)
Total Bilirubin: 0.7 mg/dL (ref 0.3–1.2)
Total Protein: 7.9 g/dL (ref 6.5–8.1)

## 2021-02-19 LAB — CBC WITH DIFFERENTIAL (CANCER CENTER ONLY)
Abs Immature Granulocytes: 0.03 10*3/uL (ref 0.00–0.07)
Basophils Absolute: 0 10*3/uL (ref 0.0–0.1)
Basophils Relative: 1 %
Eosinophils Absolute: 0.3 10*3/uL (ref 0.0–0.5)
Eosinophils Relative: 4 %
HCT: 42 % (ref 39.0–52.0)
Hemoglobin: 14.9 g/dL (ref 13.0–17.0)
Immature Granulocytes: 1 %
Lymphocytes Relative: 9 %
Lymphs Abs: 0.6 10*3/uL — ABNORMAL LOW (ref 0.7–4.0)
MCH: 36.3 pg — ABNORMAL HIGH (ref 26.0–34.0)
MCHC: 35.5 g/dL (ref 30.0–36.0)
MCV: 102.2 fL — ABNORMAL HIGH (ref 80.0–100.0)
Monocytes Absolute: 0.5 10*3/uL (ref 0.1–1.0)
Monocytes Relative: 8 %
Neutro Abs: 5.2 10*3/uL (ref 1.7–7.7)
Neutrophils Relative %: 77 %
Platelet Count: 169 10*3/uL (ref 150–400)
RBC: 4.11 MIL/uL — ABNORMAL LOW (ref 4.22–5.81)
RDW: 12.4 % (ref 11.5–15.5)
WBC Count: 6.6 10*3/uL (ref 4.0–10.5)
nRBC: 0 % (ref 0.0–0.2)

## 2021-02-19 LAB — TSH: TSH: 0.234 u[IU]/mL — ABNORMAL LOW (ref 0.320–4.118)

## 2021-02-19 MED ORDER — SODIUM CHLORIDE 0.9 % IV SOLN
Freq: Once | INTRAVENOUS | Status: AC
  Filled 2021-02-19: qty 250

## 2021-02-19 MED ORDER — SODIUM CHLORIDE 0.9% FLUSH
10.0000 mL | INTRAVENOUS | Status: DC | PRN
  Administered 2021-02-19: 10 mL
  Filled 2021-02-19: qty 10

## 2021-02-19 MED ORDER — HEPARIN SOD (PORK) LOCK FLUSH 100 UNIT/ML IV SOLN
500.0000 [IU] | Freq: Once | INTRAVENOUS | Status: AC | PRN
  Administered 2021-02-19: 500 [IU]
  Filled 2021-02-19: qty 5

## 2021-02-19 MED ORDER — SODIUM CHLORIDE 0.9 % IV SOLN
1500.0000 mg | Freq: Once | INTRAVENOUS | Status: AC
  Administered 2021-02-19: 1500 mg via INTRAVENOUS
  Filled 2021-02-19: qty 30

## 2021-02-19 NOTE — Telephone Encounter (Signed)
Called pt to scheduled genetics appt per 3/30 sch msg. Pt feels like appt would not be helpful and declined scheduling at this time.

## 2021-02-19 NOTE — Patient Instructions (Signed)
[facility] Discharge Instructions for Patients Receiving Chemotherapy  Today you received the following chemotherapy agents Durvalumab (IMFINZI).  To help prevent nausea and vomiting after your treatment, we encourage you to take your nausea medication as prescribed.   If you develop nausea and vomiting that is not controlled by your nausea medication, call the clinic.   BELOW ARE SYMPTOMS THAT SHOULD BE REPORTED IMMEDIATELY:  *FEVER GREATER THAN 100.5 F  *CHILLS WITH OR WITHOUT FEVER  NAUSEA AND VOMITING THAT IS NOT CONTROLLED WITH YOUR NAUSEA MEDICATION  *UNUSUAL SHORTNESS OF BREATH  *UNUSUAL BRUISING OR BLEEDING  TENDERNESS IN MOUTH AND THROAT WITH OR WITHOUT PRESENCE OF ULCERS  *URINARY PROBLEMS  *BOWEL PROBLEMS  UNUSUAL RASH Items with * indicate a potential emergency and should be followed up as soon as possible.  Feel free to call the clinic should you have any questions or concerns. The clinic phone number is [phone].  Please show the [place] at check-in to the Emergency Department and triage nurse.

## 2021-02-19 NOTE — Telephone Encounter (Signed)
dysphagia Received: Today Richard Mayer, MD sent to Richard Pedro, PA-C Cc: Richard Pel, RN Thanks very much Richard Santos. He has a may appt - we will get things moving   [place] - please call him and order a barium swallow (no tablet) re: dysphagia and I will determine next steps - and get him in sooner   CEG        Patient has been scheduled for barium swallow on 02/27/21 9:30 at  [facility].  He has been scheduled for appointment with Dr. Carlean Purl for 03/04/21 at 2:50.  Left message for patient to call back

## 2021-02-19 NOTE — Progress Notes (Signed)
[place] Telephone:([phone]) [phone]   Fax:(336) [phone]  OFFICE PROGRESS NOTE  [name], [place] [id number]  DIAGNOSIS: Extensive stage (T2b, N2, M1c) small cell lung cancer presented with left upper lobe pulmonary nodule in addition to left hilar mass with mediastinal invasion and solitary brain metastasis diagnosed in May 2021  PRIOR THERAPY: Palliative radiation to the left hilar and mediastinal lymphadenopathy under the care of Dr. Lisbeth Renshaw.  CURRENT THERAPY:  Palliative systemic chemotherapy with carboplatin for AUC of 5 on day 1, etoposide 100 mg/M2 on days 1, 2 and 3 as well as Imfinzi 1500 mg IV every 3 weeks with the chemotherapy.  First dose of chemotherapy May 06, 2020.  The patient will receive treatment with Cosela on the days of his chemotherapy.  Status post 10 cycles.  Starting from cycle #5 he will be.  He did wear his maintenance immunotherapy with Imfinzi 1500 mg IV every 4 weeks.    INTERVAL HISTORY: Richard Santos 60 y.o. male returns to the clinic today for follow-up visit accompanied by his wife.  The patient is feeling fine today with no concerning complaints except for intermittent dysphagia and he contacted his gastroenterologist Dr. Carlean Purl for consideration of esophageal dilatation.  He had a similar procedure 5 years ago.  He denied having any current chest pain, shortness of breath, cough or hemoptysis.  He has no nausea, vomiting, diarrhea or constipation.  He has no headache or visual changes.  He has no recent weight loss or night sweats.  He had MRI of the brain performed recently that showed no evidence for disease progression in the brain.  The patient is here today for evaluation before starting cycle #11 of his treatment.   MEDICAL HISTORY: Past Medical History:  Diagnosis Date  . Allergic rhinitis   . Allergy   . Anxiety   . Chronic low back pain   . DDD (degenerative disc disease), lumbar   . ED  (erectile dysfunction)   . Finger injury    cut pads off 3rd and 4th finger left hand/due to lawn mower accident  . GERD (gastroesophageal reflux disease)   . Heart murmur    as child only-   . History of kidney stones   . HTN (hypertension) 08/20/2016  . Hyperlipidemia   . Incomplete right bundle branch block   . Prostate cancer ([place]) dx 10/02/14   stage T1c  . Sigmoid diverticulosis   . Wears glasses     ALLERGIES:  is allergic to bee venom, elemental sulfur, sulfa antibiotics, and namenda [memantine].  MEDICATIONS:  Current Outpatient Medications  Medication Sig Dispense Refill  . acetaminophen (TYLENOL) 325 MG tablet Take 2 tablets (650 mg total) by mouth every 6 (six) hours as needed for mild pain (or Fever >/= 101). 30 tablet 0  . aspirin EC 81 MG tablet Take 81 mg by mouth daily. Swallow whole.    . Budeson-Glycopyrrol-Formoterol (BREZTRI AEROSPHERE) 160-9-4.8 MCG/ACT AERO Inhale 2 puffs into the lungs in the morning and at bedtime. 10.7 g 2  . busPIRone (BUSPAR) 10 MG tablet TAKE 1 TABLET BY MOUTH TWICE A DAY 180 tablet 3  . lidocaine-prilocaine (EMLA) cream Apply 1 application topically as needed. 30 g 2  . Multiple Vitamin (MULTIVITAMIN) capsule Take 1 capsule by mouth daily.    . ondansetron (ZOFRAN-ODT) 8 MG disintegrating tablet Take by mouth.    . pantoprazole (PROTONIX) 40 MG tablet TAKE 1  TABLET BY MOUTH DAILY BEFORE BREAKFAST 90 tablet 3  . prochlorperazine (COMPAZINE) 10 MG tablet Take 1 tablet (10 mg total) by mouth every 6 (six) hours as needed. 30 tablet 2  . Pseudoephedrine HCl (SUDAFED PO) Take 1 tablet by mouth daily. 2 week trial for ringing/congestion in ears    . traMADol (ULTRAM) 50 MG tablet TAKE HALF TABLET BY MOUTH 2 TIMES DAILY AS NEEDED FOR PAIN 60 tablet 2  . triamcinolone cream (KENALOG) 0.5 % Apply 1 application topically 3 (three) times daily. 90 g 1  . meloxicam (MOBIC) 15 MG tablet Take 15 mg by mouth daily. (Patient not taking: Reported on  02/19/2021)     No current facility-administered medications for this visit.    SURGICAL HISTORY:  Past Surgical History:  Procedure Laterality Date  . BRONCHIAL BIOPSY  04/12/2020   Procedure: BRONCHIAL BIOPSIES;  Surgeon: Marshell Garfinkel, MD;  Location: [facility];  Service: Cardiopulmonary;;  . BRONCHIAL BRUSHINGS  04/12/2020   Procedure: BRONCHIAL BRUSHINGS;  Surgeon: Marshell Garfinkel, MD;  Location: [facility];  Service: Cardiopulmonary;;  . BRONCHIAL NEEDLE ASPIRATION BIOPSY  04/12/2020   Procedure: BRONCHIAL NEEDLE ASPIRATION BIOPSIES;  Surgeon: Marshell Garfinkel, MD;  Location: [facility];  Service: Cardiopulmonary;;  . BRONCHIAL WASHINGS  04/12/2020   Procedure: BRONCHIAL WASHINGS;  Surgeon: Marshell Garfinkel, MD;  Location: [facility];  Service: Cardiopulmonary;;  . COLONOSCOPY    . COLONOSCOPY W/ POLYPECTOMY  04-19-2012  . ENDOBRONCHIAL ULTRASOUND N/A 04/12/2020   Procedure: ENDOBRONCHIAL ULTRASOUND;  Surgeon: Marshell Garfinkel, MD;  Location: [facility];  Service: Cardiopulmonary;  Laterality: N/A;  . ESOPHAGOGASTRODUODENOSCOPY  08-05-2010  . HEMOSTASIS CONTROL  04/12/2020   Procedure: HEMOSTASIS CONTROL;  Surgeon: Marshell Garfinkel, MD;  Location: [facility];  Service: Cardiopulmonary;;  . IR IMAGING GUIDED PORT INSERTION  05/31/2020  . POLYPECTOMY    . PROSTATE BIOPSY  10/02/14  . RADIOACTIVE SEED IMPLANT N/A 01/30/2015   Procedure: RADIOACTIVE SEED IMPLANT;  Surgeon: Rana Snare, MD;  Location: [facility];  Service: Urology;  Laterality: N/A;  . UPPER GASTROINTESTINAL ENDOSCOPY    . VIDEO BRONCHOSCOPY N/A 04/12/2020   Procedure: VIDEO BRONCHOSCOPY WITHOUT FLUORO;  Surgeon: Marshell Garfinkel, MD;  Location: [facility];  Service: Cardiopulmonary;  Laterality: N/A;    REVIEW OF SYSTEMS:  A comprehensive review of systems was negative except for: Gastrointestinal: positive for dysphagia   PHYSICAL EXAMINATION: General appearance: alert, cooperative and no  distress Head: Normocephalic, without obvious abnormality, atraumatic Neck: no adenopathy, no JVD, supple, symmetrical, trachea midline and thyroid not enlarged, symmetric, no tenderness/mass/nodules Lymph nodes: Cervical, supraclavicular, and axillary nodes normal. Resp: clear to auscultation bilaterally Back: symmetric, no curvature. ROM normal. No CVA tenderness. Cardio: regular rate and rhythm, S1, S2 normal, no murmur, click, rub or gallop GI: soft, non-tender; bowel sounds normal; no masses,  no organomegaly Extremities: extremities normal, atraumatic, no cyanosis or edema  ECOG PERFORMANCE STATUS: 1 - Symptomatic but completely ambulatory  Blood pressure 111/74, pulse 93, temperature (!) 95.7 F (35.4 C), temperature source Tympanic, resp. rate 18, weight 150 lb (68 kg), SpO2 100 %.  LABORATORY DATA: Lab Results  Component Value Date   WBC 6.6 02/19/2021   HGB 14.9 02/19/2021   HCT 42.0 02/19/2021   MCV 102.2 (H) 02/19/2021   PLT 169 02/19/2021      Chemistry      Component Value Date/Time   NA 139 02/19/2021 1112   K 3.8 02/19/2021 1112   CL 101 02/19/2021 1112   CO2  26 02/19/2021 1112   BUN 13 02/19/2021 1112   CREATININE 0.88 02/19/2021 1112      Component Value Date/Time   CALCIUM 9.2 02/19/2021 1112   ALKPHOS 88 02/19/2021 1112   AST 61 (H) 02/19/2021 1112   ALT 62 (H) 02/19/2021 1112   BILITOT 0.7 02/19/2021 1112       RADIOGRAPHIC STUDIES: CT Chest W Contrast  Result Date: 01/20/2021 CLINICAL DATA:  Restaging small cell lung cancer. Chemotherapy and radiation therapy completed. Immunotherapy ongoing. History of prostate cancer. Intermittent cough and nausea. EXAM: CT CHEST, ABDOMEN, AND PELVIS WITH CONTRAST TECHNIQUE: Multidetector CT imaging of the chest, abdomen and pelvis was performed following the standard protocol during bolus administration of intravenous contrast. CONTRAST:  180mL OMNIPAQUE IOHEXOL 300 MG/ML  SOLN COMPARISON:  CTs 10/28/2020 and  08/05/2020. FINDINGS: CT CHEST FINDINGS Cardiovascular: Right IJ Port-A-Cath extends to the mid SVC level. The left upper lobe pulmonary artery appears chronically occluded. No acute vascular findings are seen. There is atherosclerosis of the aorta, great vessels and coronary arteries. The heart size is normal. There is no pericardial effusion. Mediastinum/Nodes: There are no enlarged mediastinal, hilar or axillary lymph nodes. Mildly improved soft tissue thickening about the left hilum. The thyroid gland, trachea and esophagus demonstrate no significant findings. Lungs/Pleura: No pleural effusion or pneumothorax. Peripheral left upper lobe nodule measures 2.2 x 1.1 cm on image 51/6 (previously 2.4 x 1.4 cm). Stable biapical scarring and mild emphysematous changes. Involving radiation changes medially in the left hemithorax with decreased size of previously demonstrated cavitary lesion in the lingula, now measuring 3.1 x 1.8 cm on image 76/6 (previously 4.3 x 3.8 cm). There is associated architectural distortion and bronchiectasis, but no evidence of local recurrence. The right lung remains clear. Musculoskeletal/Chest wall: No chest wall mass or suspicious osseous findings. CT ABDOMEN AND PELVIS FINDINGS Hepatobiliary: The liver is normal in density without suspicious focal abnormality. No evidence of gallstones, gallbladder wall thickening or biliary dilatation. Pancreas: Appears stable, without significant findings. No evidence of ductal dilatation or surrounding inflammation. Spleen: Normal in size without focal abnormality. Adrenals/Urinary Tract: New small left adrenal nodule, measuring 2.0 x 1.4 cm on image 71/2, suspicious for metastasis. There is a new smaller component superiorly measuring 1.6 cm on image 68/2. The right adrenal gland appears normal. Stable nonobstructing calculus in the interpolar region of the left kidney and cyst anteriorly in the interpolar region of the right kidney. No evidence of  renal mass, ureteral calculus or hydronephrosis. The bladder appears unremarkable for its degree of distention. Stomach/Bowel: The stomach appears unremarkable for its degree of distension. No evidence of bowel wall thickening, distention or surrounding inflammatory change. The appendix appears normal. Mild sigmoid colon diverticular changes. Vascular/Lymphatic: There are no enlarged abdominal or pelvic lymph nodes. Stable small lymph nodes in the porta hepatis. No acute vascular findings. There is aortic and branch vessel atherosclerosis without evidence of large vessel occlusion or aneurysm. The portal, superior mesenteric and splenic veins are patent. Reproductive: Stable appearance of the prostate gland post brachytherapy. Other: No ascites or peritoneal nodularity. Musculoskeletal: No acute or significant osseous findings. Stable degenerative disc disease at L5-S1. IMPRESSION: 1. Decreased size of left upper lobe pulmonary nodule consistent with response to therapy. No evidence of local recurrence or thoracic metastases. 2. Evolving radiation changes medially in the left hemithorax with decreased size of previously demonstrated cavitary lesion in the lingula. 3. New small left adrenal nodules, suspicious for metastasis. No other significant changes in the abdomen  or pelvis. 4. Aortic Atherosclerosis (ICD10-I70.0) and Emphysema (ICD10-J43.9). Electronically Signed   By: Richardean Sale M.D.   On: 01/20/2021 16:40   MR Brain W Wo Contrast  Result Date: 02/17/2021 CLINICAL DATA:  Metastatic lung cancer post radiation, follow-up EXAM: MRI HEAD WITHOUT AND WITH CONTRAST TECHNIQUE: Multiplanar, multiecho pulse sequences of the brain and surrounding structures were obtained without and with intravenous contrast. CONTRAST:  20mL GADAVIST GADOBUTROL 1 MMOL/ML IV SOLN COMPARISON:  11/07/2020 FINDINGS: Brain: As before, there is a focus of susceptibility hypointensity in the inferior left cerebellum at the site of  previously treated small metastasis. There remains no enhancement at this site. There is no new mass or abnormal enhancement. There is no acute infarction or intracranial hemorrhage. There is no hydrocephalus or extra-axial fluid collection. Ventricles and sulci are stable in size and configuration. Patchy foci of T2 hyperintensity in the supratentorial white matter are nonspecific but probably reflects similar chronic microvascular ischemic changes. Vascular: Major vessel flow voids at the skull base are preserved. Skull and upper cervical spine: Normal marrow signal is preserved. Sinuses/Orbits: Minor mucosal thickening.  Orbits are unremarkable. Other: Sella is unremarkable. Persistent bilateral mastoid effusions. IMPRESSION: Stable post treatment appearance. No evidence of recurrent or new intracranial metastatic disease. Electronically Signed   By: Macy Mis M.D.   On: 02/17/2021 12:09   CT Abdomen Pelvis W Contrast  Result Date: 01/20/2021 CLINICAL DATA:  Restaging small cell lung cancer. Chemotherapy and radiation therapy completed. Immunotherapy ongoing. History of prostate cancer. Intermittent cough and nausea. EXAM: CT CHEST, ABDOMEN, AND PELVIS WITH CONTRAST TECHNIQUE: Multidetector CT imaging of the chest, abdomen and pelvis was performed following the standard protocol during bolus administration of intravenous contrast. CONTRAST:  134mL OMNIPAQUE IOHEXOL 300 MG/ML  SOLN COMPARISON:  CTs 10/28/2020 and 08/05/2020. FINDINGS: CT CHEST FINDINGS Cardiovascular: Right IJ Port-A-Cath extends to the mid SVC level. The left upper lobe pulmonary artery appears chronically occluded. No acute vascular findings are seen. There is atherosclerosis of the aorta, great vessels and coronary arteries. The heart size is normal. There is no pericardial effusion. Mediastinum/Nodes: There are no enlarged mediastinal, hilar or axillary lymph nodes. Mildly improved soft tissue thickening about the left hilum. The  thyroid gland, trachea and esophagus demonstrate no significant findings. Lungs/Pleura: No pleural effusion or pneumothorax. Peripheral left upper lobe nodule measures 2.2 x 1.1 cm on image 51/6 (previously 2.4 x 1.4 cm). Stable biapical scarring and mild emphysematous changes. Involving radiation changes medially in the left hemithorax with decreased size of previously demonstrated cavitary lesion in the lingula, now measuring 3.1 x 1.8 cm on image 76/6 (previously 4.3 x 3.8 cm). There is associated architectural distortion and bronchiectasis, but no evidence of local recurrence. The right lung remains clear. Musculoskeletal/Chest wall: No chest wall mass or suspicious osseous findings. CT ABDOMEN AND PELVIS FINDINGS Hepatobiliary: The liver is normal in density without suspicious focal abnormality. No evidence of gallstones, gallbladder wall thickening or biliary dilatation. Pancreas: Appears stable, without significant findings. No evidence of ductal dilatation or surrounding inflammation. Spleen: Normal in size without focal abnormality. Adrenals/Urinary Tract: New small left adrenal nodule, measuring 2.0 x 1.4 cm on image 71/2, suspicious for metastasis. There is a new smaller component superiorly measuring 1.6 cm on image 68/2. The right adrenal gland appears normal. Stable nonobstructing calculus in the interpolar region of the left kidney and cyst anteriorly in the interpolar region of the right kidney. No evidence of renal mass, ureteral calculus or hydronephrosis. The bladder appears  unremarkable for its degree of distention. Stomach/Bowel: The stomach appears unremarkable for its degree of distension. No evidence of bowel wall thickening, distention or surrounding inflammatory change. The appendix appears normal. Mild sigmoid colon diverticular changes. Vascular/Lymphatic: There are no enlarged abdominal or pelvic lymph nodes. Stable small lymph nodes in the porta hepatis. No acute vascular findings.  There is aortic and branch vessel atherosclerosis without evidence of large vessel occlusion or aneurysm. The portal, superior mesenteric and splenic veins are patent. Reproductive: Stable appearance of the prostate gland post brachytherapy. Other: No ascites or peritoneal nodularity. Musculoskeletal: No acute or significant osseous findings. Stable degenerative disc disease at L5-S1. IMPRESSION: 1. Decreased size of left upper lobe pulmonary nodule consistent with response to therapy. No evidence of local recurrence or thoracic metastases. 2. Evolving radiation changes medially in the left hemithorax with decreased size of previously demonstrated cavitary lesion in the lingula. 3. New small left adrenal nodules, suspicious for metastasis. No other significant changes in the abdomen or pelvis. 4. Aortic Atherosclerosis (ICD10-I70.0) and Emphysema (ICD10-J43.9). Electronically Signed   By: Richardean Sale M.D.   On: 01/20/2021 16:40    ASSESSMENT AND PLAN: This is a very pleasant 60 years old white male recently diagnosed with extensive stage small cell lung cancer presented with left upper lobe lung mass in addition to left hilar and mediastinal lymphadenopathy as well as solitary brain metastasis diagnosed in May 2021. He underwent palliative radiotherapy to the left hilar and mediastinal lymphadenopathy in addition to the radiotherapy to the brain metastasis.  He continues to have residual odynophagia and dysphagia from the palliative radiotherapy. He is currently undergoing systemic chemotherapy with carboplatin for AUC of 5 on day 1, etoposide 100 mg/M2 on days 1, 2 and 3 with Cosela infusion before chemotherapy.  He is status post 10 cycles of treatment  Starting from cycle #5 the patient is on treatment with maintenance Imfinzi every 4 weeks. The patient has been tolerating his maintenance treatment with Imfinzi fairly well. I recommended for him to proceed with cycle #11 of his treatment as  planned. The patient will come back for follow-up visit in 4 weeks for evaluation before the next cycle of his treatment. For the history of dysphagia, he will see Dr. Carlean Purl for evaluation and consideration of esophageal dilatation. For the personal and family history of multiple malignancy, I will refer the patient for genetic counseling. He was advised to call immediately if he has any concerning symptoms in the interval. The patient voices understanding of current disease status and treatment options and is in agreement with the current care plan.  All questions were answered. The patient knows to call the clinic with any problems, questions or concerns. We can certainly see the patient much sooner if necessary.   Disclaimer: This note was dictated with voice recognition software. Similar sounding words can inadvertently be transcribed and may not be corrected upon review.

## 2021-02-20 NOTE — Telephone Encounter (Signed)
Patient responded to MyChart messages and will keep appointment for both OV and BS

## 2021-02-20 NOTE — Telephone Encounter (Signed)
Left message for patient to call back  Detailed MyChart message sent to patient with the details of below.  I requested he message me back to confirm.

## 2021-02-21 ENCOUNTER — Other Ambulatory Visit: Payer: Self-pay | Admitting: Radiation Therapy

## 2021-02-21 DIAGNOSIS — C7931 Secondary malignant neoplasm of brain: Secondary | ICD-10-CM

## 2021-02-24 ENCOUNTER — Telehealth: Payer: Self-pay | Admitting: Internal Medicine

## 2021-02-24 ENCOUNTER — Encounter: Payer: Self-pay | Admitting: Internal Medicine

## 2021-02-24 NOTE — Telephone Encounter (Signed)
Scheduled per los. Called and spoke with patient. Confirmed appts. Cancelled genetics appt because patient didn't feel he needed it.

## 2021-02-26 ENCOUNTER — Telehealth: Payer: Self-pay | Admitting: Physician Assistant

## 2021-02-26 NOTE — Telephone Encounter (Signed)
R/s appts per 4/5 sch msg. Pt aware.

## 2021-02-27 ENCOUNTER — Other Ambulatory Visit: Payer: Self-pay

## 2021-02-27 ENCOUNTER — Ambulatory Visit (HOSPITAL_COMMUNITY)
Admission: RE | Admit: 2021-02-27 | Discharge: 2021-02-27 | Disposition: A | Discharge status: Home / Self Care | Payer: 59 | Source: Ambulatory Visit | Attending: Internal Medicine | Admitting: Internal Medicine

## 2021-02-27 DIAGNOSIS — R1310 Dysphagia, unspecified: Secondary | ICD-10-CM | POA: Diagnosis not present

## 2021-03-04 ENCOUNTER — Ambulatory Visit (INDEPENDENT_AMBULATORY_CARE_PROVIDER_SITE_OTHER): Payer: 59 | Admitting: Internal Medicine

## 2021-03-04 ENCOUNTER — Encounter: Payer: Self-pay | Admitting: Internal Medicine

## 2021-03-04 VITALS — BP 128/72 | HR 72 | Ht 72.0 in | Wt 153.0 lb

## 2021-03-04 DIAGNOSIS — R1319 Other dysphagia: Secondary | ICD-10-CM

## 2021-03-04 DIAGNOSIS — C3412 Malignant neoplasm of upper lobe, left bronchus or lung: Secondary | ICD-10-CM | POA: Diagnosis not present

## 2021-03-04 NOTE — Progress Notes (Signed)
Richard Santos 60 y.o. 03/29/1961 [id number] Referred by: Worthy Flank, PA-C  Assessment & Plan:   Encounter Diagnoses  Name Primary?  . Esophageal dysphagia Yes  . Small cell lung cancer, left upper lobe (HCC)    His pattern of symptoms is a bit unusual I thought he was going to have a radiation stricture but he does not appear to.  He could have a radiation esophagitis though the changes on barium swallow are in the lower esophagus not in the typical area where we would expect it for a left upper lobe cancer that was treated.  I do not think this is a CNS problem.  I will arrange for an upper GI endoscopy with esophageal dilation.  Biopsies as needed.  I do not think his chemotherapy will create problems as white counts are normal.    The risks and benefits as well as alternatives of endoscopic procedure(s) have been discussed and reviewed. All questions answered. The patient agrees to proceed.  Copy to Worthy Flank, PA-C Lorna Few, MD    Subjective:   Chief Complaint:  HPI Richard Santos is a 60 year old white man with a history of dysphagia and an EGD last in September 2016 with endoscopic appearance raising question of eosinophilic esophagitis and was treated with a 54 [place] Maloney dilator.  Biopsies did not show eosinophilic esophagitis he did well for a while without dysphagia.  He now has metastatic lung cancer including brain metastases and is under therapy.  He has had radiation treatment and he is on chemo.  Recent MRI of the brain on 02/13/2021 stable posttreatment appearance and no evidence of recurrent or new intracranial metastatic disease showed followed by Dr. Earlie Server.  He has been having a problem where every time he eats he tends to spit it back up especially in the morning really.  As the day goes on might be better.  Liquids are all right meat and bread are all right but other foods can be a problem.  He seems to think stress may be part of it if he has  a few beers he may relax and be able to eat.  It almost immediately comes back up.  I had him do a barium swallow in anticipation of this visit on 02/27/2021.  Findings as below.  I personally reviewed the images   IMPRESSION: 1. Mild distal esophageal fold accentuation on mucosal relief assessment, which could reflect low-grade esophagitis. 2. Very small type 1 hiatal hernia. 3. No esophageal stricture or narrowing. No significant dysmotility was observed.  Wt Readings from Last 3 Encounters:  03/04/21 153 lb (69.4 kg)  02/19/21 150 lb (68 kg)  02/11/21 151 lb (68.5 kg)    Allergies  Allergen Reactions  . Bee Venom Swelling  . Elemental Sulfur Other (See Comments)    Acute renal failure  . Sulfa Antibiotics   . Namenda [Memantine] Nausea Only    Nausea and fatigue   Current Meds  Medication Sig  . acetaminophen (TYLENOL) 325 MG tablet Take 2 tablets (650 mg total) by mouth every 6 (six) hours as needed for mild pain (or Fever >/= 101).  Marland Kitchen aspirin EC 81 MG tablet Take 81 mg by mouth daily. Swallow whole.  . Budeson-Glycopyrrol-Formoterol (BREZTRI AEROSPHERE) 160-9-4.8 MCG/ACT AERO Inhale 2 puffs into the lungs in the morning and at bedtime.  . busPIRone (BUSPAR) 10 MG tablet TAKE 1 TABLET BY MOUTH TWICE A DAY  . lidocaine-prilocaine (EMLA) cream Apply 1 application topically as needed.  Marland Kitchen  meloxicam (MOBIC) 15 MG tablet Take 15 mg by mouth daily.  . Multiple Vitamin (MULTIVITAMIN) capsule Take 1 capsule by mouth daily.  . ondansetron (ZOFRAN-ODT) 8 MG disintegrating tablet Take by mouth.  . pantoprazole (PROTONIX) 40 MG tablet TAKE 1 TABLET BY MOUTH DAILY BEFORE BREAKFAST  . prochlorperazine (COMPAZINE) 10 MG tablet Take 1 tablet (10 mg total) by mouth every 6 (six) hours as needed.  . Pseudoephedrine HCl (SUDAFED PO) Take 1 tablet by mouth daily. 2 week trial for ringing/congestion in ears  . traMADol (ULTRAM) 50 MG tablet TAKE HALF TABLET BY MOUTH 2 TIMES DAILY AS NEEDED FOR PAIN   . triamcinolone cream (KENALOG) 0.5 % Apply 1 application topically 3 (three) times daily.   Past Medical History:  Diagnosis Date  . Allergic rhinitis   . Allergy   . Anxiety   . Chronic low back pain   . DDD (degenerative disc disease), lumbar   . ED (erectile dysfunction)   . Finger injury    cut pads off 3rd and 4th finger left hand/due to lawn mower accident  . GERD (gastroesophageal reflux disease)   . Heart murmur    as child only-   . History of kidney stones   . HTN (hypertension) 08/20/2016  . Hyperlipidemia   . Incomplete right bundle branch block   . Prostate cancer ([place]) dx 10/02/14   stage T1c  . Sigmoid diverticulosis   . Wears glasses    Past Surgical History:  Procedure Laterality Date  . BRONCHIAL BIOPSY  04/12/2020   Procedure: BRONCHIAL BIOPSIES;  Surgeon: Marshell Garfinkel, MD;  Location: [place];  Service: Cardiopulmonary;;  . BRONCHIAL BRUSHINGS  04/12/2020   Procedure: BRONCHIAL BRUSHINGS;  Surgeon: Marshell Garfinkel, MD;  Location: [place];  Service: Cardiopulmonary;;  . BRONCHIAL NEEDLE ASPIRATION BIOPSY  04/12/2020   Procedure: BRONCHIAL NEEDLE ASPIRATION BIOPSIES;  Surgeon: Marshell Garfinkel, MD;  Location: [place];  Service: Cardiopulmonary;;  . BRONCHIAL WASHINGS  04/12/2020   Procedure: BRONCHIAL WASHINGS;  Surgeon: Marshell Garfinkel, MD;  [facility];  Service: Cardiopulmonary;;  . COLONOSCOPY    . COLONOSCOPY W/ POLYPECTOMY  04-19-2012  . ENDOBRONCHIAL ULTRASOUND N/A 04/12/2020   Procedure: ENDOBRONCHIAL ULTRASOUND;  Surgeon: Marshell Garfinkel, MD;  Location: [place];  Service: Cardiopulmonary;  Laterality: N/A;  . ESOPHAGOGASTRODUODENOSCOPY  08-05-2010  . HEMOSTASIS CONTROL  04/12/2020   Procedure: HEMOSTASIS CONTROL;  Surgeon: Marshell Garfinkel, MD;  Location: [place];  Service: Cardiopulmonary;;  . IR IMAGING GUIDED PORT INSERTION  05/31/2020  . POLYPECTOMY    . PROSTATE BIOPSY  10/02/14  . RADIOACTIVE SEED IMPLANT N/A  01/30/2015   Procedure: RADIOACTIVE SEED IMPLANT;  Surgeon: Rana Snare, MD;  Location: [facility];  Service: Urology;  Laterality: N/A;  . UPPER GASTROINTESTINAL ENDOSCOPY    . 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 He is married with no biological children.   family history includes Breast cancer in his sister; Heart disease in his father, mother, and another family member; Prostate cancer in his father.   Review of Systems As per HPI  Objective:   Physical Exam BP 128/72   Pulse 72   Ht 6' (1.829 m)   Wt 153 lb (69.4 kg)   BMI 20.75 kg/m  Thin well-developed  well-nourished white man in no acute distress The neck is without mass or thyromegaly Lungs are clear Normal heart sounds S1-S2 no rubs murmurs or gallops The abdomen is thin soft and nontender He is alert noted x3   Data reviewed include radiation oncology notes pulmonary notes medical oncology notes imaging as above previous GI work-up as well

## 2021-03-04 NOTE — Patient Instructions (Signed)
  You have been scheduled for an endoscopy. Please follow written instructions given to you at your visit today. If you use inhalers (even only as needed), please bring them with you on the day of your procedure.   I appreciate the opportunity to care for you. Carl Gessner, MD, FACG 

## 2021-03-10 ENCOUNTER — Encounter: Payer: 59 | Admitting: Licensed Clinical Social Worker

## 2021-03-13 ENCOUNTER — Other Ambulatory Visit: Payer: Self-pay | Admitting: Pulmonary Disease

## 2021-03-14 ENCOUNTER — Telehealth: Payer: Self-pay

## 2021-03-14 MED ORDER — ONDANSETRON 8 MG PO TBDP: 8.0000 mg | ORAL_TABLET | Freq: Three times a day (TID) | ORAL | 2 refills | Status: DC | PRN

## 2021-03-14 NOTE — Telephone Encounter (Signed)
Fax from [facility] received requesting Zofran refill.

## 2021-03-19 ENCOUNTER — Inpatient Hospital Stay: Payer: 59

## 2021-03-19 ENCOUNTER — Inpatient Hospital Stay: Payer: 59 | Attending: Internal Medicine | Admitting: Internal Medicine

## 2021-03-19 ENCOUNTER — Other Ambulatory Visit: Payer: Self-pay

## 2021-03-19 ENCOUNTER — Telehealth: Payer: Self-pay | Admitting: Internal Medicine

## 2021-03-19 VITALS — BP 105/76 | HR 97 | Temp 97.1°F | Resp 19 | Ht 72.0 in | Wt 152.1 lb

## 2021-03-19 DIAGNOSIS — C3412 Malignant neoplasm of upper lobe, left bronchus or lung: Secondary | ICD-10-CM

## 2021-03-19 DIAGNOSIS — C3490 Malignant neoplasm of unspecified part of unspecified bronchus or lung: Secondary | ICD-10-CM

## 2021-03-19 DIAGNOSIS — Z5112 Encounter for antineoplastic immunotherapy: Secondary | ICD-10-CM | POA: Diagnosis not present

## 2021-03-19 DIAGNOSIS — Z95828 Presence of other vascular implants and grafts: Secondary | ICD-10-CM

## 2021-03-19 DIAGNOSIS — C7931 Secondary malignant neoplasm of brain: Secondary | ICD-10-CM

## 2021-03-19 DIAGNOSIS — Z79899 Other long term (current) drug therapy: Secondary | ICD-10-CM | POA: Insufficient documentation

## 2021-03-19 DIAGNOSIS — Z7982 Long term (current) use of aspirin: Secondary | ICD-10-CM | POA: Diagnosis not present

## 2021-03-19 LAB — CBC WITH DIFFERENTIAL (CANCER CENTER ONLY)
Abs Immature Granulocytes: 0.04 10*3/uL (ref 0.00–0.07)
Basophils Absolute: 0 10*3/uL (ref 0.0–0.1)
Basophils Relative: 1 %
Eosinophils Absolute: 0.3 10*3/uL (ref 0.0–0.5)
Eosinophils Relative: 6 %
HCT: 37.8 % — ABNORMAL LOW (ref 39.0–52.0)
Hemoglobin: 13.1 g/dL (ref 13.0–17.0)
Immature Granulocytes: 1 %
Lymphocytes Relative: 14 %
Lymphs Abs: 0.8 10*3/uL (ref 0.7–4.0)
MCH: 35.2 pg — ABNORMAL HIGH (ref 26.0–34.0)
MCHC: 34.7 g/dL (ref 30.0–36.0)
MCV: 101.6 fL — ABNORMAL HIGH (ref 80.0–100.0)
Monocytes Absolute: 0.5 10*3/uL (ref 0.1–1.0)
Monocytes Relative: 9 %
Neutro Abs: 4.1 10*3/uL (ref 1.7–7.7)
Neutrophils Relative %: 69 %
Platelet Count: 207 10*3/uL (ref 150–400)
RBC: 3.72 MIL/uL — ABNORMAL LOW (ref 4.22–5.81)
RDW: 12.1 % (ref 11.5–15.5)
WBC Count: 5.8 10*3/uL (ref 4.0–10.5)
nRBC: 0 % (ref 0.0–0.2)

## 2021-03-19 LAB — CMP (CANCER CENTER ONLY)
ALT: 48 U/L — ABNORMAL HIGH (ref 0–44)
AST: 55 U/L — ABNORMAL HIGH (ref 15–41)
Albumin: 4.2 g/dL (ref 3.5–5.0)
Alkaline Phosphatase: 72 U/L (ref 38–126)
Anion gap: 10 (ref 5–15)
BUN: 15 mg/dL (ref 6–20)
CO2: 26 mmol/L (ref 22–32)
Calcium: 9.7 mg/dL (ref 8.9–10.3)
Chloride: 101 mmol/L (ref 98–111)
Creatinine: 0.95 mg/dL (ref 0.61–1.24)
GFR, Estimated: >60 mL/min (ref >60)
Glucose, Bld: 90 mg/dL (ref 70–99)
Potassium: 4.4 mmol/L (ref 3.5–5.1)
Sodium: 137 mmol/L (ref 135–145)
Total Bilirubin: 0.6 mg/dL (ref 0.3–1.2)
Total Protein: 8.1 g/dL (ref 6.5–8.1)

## 2021-03-19 MED ORDER — SODIUM CHLORIDE 0.9% FLUSH
10.0000 mL | Freq: Once | INTRAVENOUS | Status: AC
  Administered 2021-03-19: 10 mL
  Filled 2021-03-19: qty 10

## 2021-03-19 MED ORDER — SODIUM CHLORIDE 0.9 % IV SOLN
Freq: Once | INTRAVENOUS | Status: AC
  Filled 2021-03-19: qty 250

## 2021-03-19 MED ORDER — SODIUM CHLORIDE 0.9% FLUSH
10.0000 mL | INTRAVENOUS | Status: DC | PRN
  Administered 2021-03-19: 10 mL
  Filled 2021-03-19: qty 10

## 2021-03-19 MED ORDER — HEPARIN SOD (PORK) LOCK FLUSH 100 UNIT/ML IV SOLN
500.0000 [IU] | Freq: Once | INTRAVENOUS | Status: AC | PRN
  Administered 2021-03-19: 500 [IU]
  Filled 2021-03-19: qty 5

## 2021-03-19 MED ORDER — SODIUM CHLORIDE 0.9 % IV SOLN
1500.0000 mg | Freq: Once | INTRAVENOUS | Status: AC
  Administered 2021-03-19: 1500 mg via INTRAVENOUS
  Filled 2021-03-19: qty 30

## 2021-03-19 NOTE — Telephone Encounter (Signed)
Scheduled follow-up appointment per 4/27 los. Patient is aware.

## 2021-03-19 NOTE — Patient Instructions (Signed)
[facility]  Discharge Instructions: Thank you for choosing [facility] to provide your oncology and hematology care.   If you have a lab appointment with the Cancer Center, please go directly to the Cancer Center and check in at the registration area.   Wear comfortable clothing and clothing appropriate for easy access to any Portacath or PICC line.   We strive to give you quality time with your provider. You may need to reschedule your appointment if you arrive late (15 or more minutes).  Arriving late affects you and other patients whose appointments are after yours.  Also, if you miss three or more appointments without notifying the office, you may be dismissed from the clinic at the provider's discretion.      For prescription refill requests, have your pharmacy contact our office and allow 72 hours for refills to be completed.    Today you received the following chemotherapy and/or immunotherapy agents Imfinzi      To help prevent nausea and vomiting after your treatment, we encourage you to take your nausea medication as directed.  BELOW ARE SYMPTOMS THAT SHOULD BE REPORTED IMMEDIATELY: *FEVER GREATER THAN 100.4 F (38 C) OR HIGHER *CHILLS OR SWEATING *NAUSEA AND VOMITING THAT IS NOT CONTROLLED WITH YOUR NAUSEA MEDICATION *UNUSUAL SHORTNESS OF BREATH *UNUSUAL BRUISING OR BLEEDING *URINARY PROBLEMS (pain or burning when urinating, or frequent urination) *BOWEL PROBLEMS (unusual diarrhea, constipation, pain near the anus) TENDERNESS IN MOUTH AND THROAT WITH OR WITHOUT PRESENCE OF ULCERS (sore throat, sores in mouth, or a toothache) UNUSUAL RASH, SWELLING OR PAIN  UNUSUAL VAGINAL DISCHARGE OR ITCHING   Items with * indicate a potential emergency and should be followed up as soon as possible or go to the Emergency Department if any problems should occur.  Please show the CHEMOTHERAPY ALERT CARD or IMMUNOTHERAPY ALERT CARD at check-in to the  Emergency Department and triage nurse.  Should you have questions after your visit or need to cancel or reschedule your appointment, please contact [facility]  Dept: [phone]  and follow the prompts.  Office hours are 8:00 a.m. to 4:30 p.m. Monday - Friday. Please note that voicemails left after 4:00 p.m. may not be returned until the following business day.  We are closed weekends and major holidays. You have access to a nurse at all times for urgent questions. Please call the main number to the clinic Dept: [phone] and follow the prompts.   For any non-urgent questions, you may also contact your provider using MyChart. We now offer e-Visits for anyone 18 and older to request care online for non-urgent symptoms. For details visit [email or web address].   Also download the MyChart app! Go to the app store, search "MyChart", open the app, select [facility], and log in with your MyChart username and password.  Due to Covid, a mask is required upon entering the hospital/clinic. If you do not have a mask, one will be given to you upon arrival. For doctor visits, patients may have 1 support person aged 18 or older with them. For treatment visits, patients cannot have anyone with them due to current Covid guidelines and our immunocompromised population.   

## 2021-03-19 NOTE — Progress Notes (Signed)
[place] [phone]   [phone]  OFFICE PROGRESS NOTE  [name], MD [place] [address]  DIAGNOSIS: Extensive stage (T2b, N2, M1c) small cell lung cancer presented with left upper lobe pulmonary nodule in addition to left hilar mass with mediastinal invasion and solitary brain metastasis diagnosed in May 2021  PRIOR THERAPY: Palliative radiation to the left hilar and mediastinal lymphadenopathy under the care of Dr. Lisbeth Renshaw.  CURRENT THERAPY:  Palliative systemic chemotherapy with carboplatin for AUC of 5 on day 1, etoposide 100 mg/M2 on days 1, 2 and 3 as well as Imfinzi 1500 mg IV every 3 weeks with the chemotherapy.  First dose of chemotherapy May 06, 2020.  The patient will receive treatment with Cosela on the days of his chemotherapy.  Status post 11 cycles.  Starting from cycle #5 he will be.  He did wear his maintenance immunotherapy with Imfinzi 1500 mg IV every 4 weeks.    INTERVAL HISTORY: DIANE MOCHIZUKI 60 y.o. male returns to the clinic today for follow-up visit accompanied by his wife.  The patient is feeling fine today with no concerning complaints except for the mild dysphagia.  He is currently seen by gastroenterology and expected to have upper endoscopy soon for further evaluation and dilatation of the esophagus if needed.  He denied having any current chest pain but has shortness of breath with exertion with no cough or hemoptysis.  He denied having any fever or chills.  He has no nausea, vomiting, diarrhea or constipation.  He denied having any headache or visual changes.  The patient is here today for evaluation before starting cycle #12 of his treatment.  MEDICAL HISTORY: Past Medical History:  Diagnosis Date  . Allergic rhinitis   . Allergy   . Anxiety   . Chronic low back pain   . DDD (degenerative disc disease), lumbar   . ED (erectile dysfunction)   . Finger injury    cut pads off 3rd and 4th  finger left hand/due to lawn mower accident  . GERD (gastroesophageal reflux disease)   . Heart murmur    as child only-   . History of kidney stones   . HTN (hypertension) 08/20/2016  . Hyperlipidemia   . Incomplete right bundle branch block   . Prostate cancer ([place]) dx 10/02/14   stage T1c  . Sigmoid diverticulosis   . Wears glasses     ALLERGIES:  is allergic to bee venom, elemental sulfur, sulfa antibiotics, and namenda [memantine].  MEDICATIONS:  Current Outpatient Medications  Medication Sig Dispense Refill  . acetaminophen (TYLENOL) 325 MG tablet Take 2 tablets (650 mg total) by mouth every 6 (six) hours as needed for mild pain (or Fever >/= 101). 30 tablet 0  . aspirin EC 81 MG tablet Take 81 mg by mouth daily. Swallow whole.    Marland Kitchen BREZTRI AEROSPHERE 160-9-4.8 MCG/ACT AERO INHALE 2 PUFFS INTO THE LUNGS IN THE MORNING AND AT BEDTIME. 10.7 g 10  . busPIRone (BUSPAR) 10 MG tablet TAKE 1 TABLET BY MOUTH TWICE A DAY 180 tablet 3  . lidocaine-prilocaine (EMLA) cream Apply 1 application topically as needed. 30 g 2  . meloxicam (MOBIC) 15 MG tablet Take 15 mg by mouth daily.    . Multiple Vitamin (MULTIVITAMIN) capsule Take 1 capsule by mouth daily.    . ondansetron (ZOFRAN-ODT) 8 MG disintegrating tablet Take 1 tablet (8 mg total) by mouth every 8 (eight)  hours as needed for nausea or vomiting. 30 tablet 2  . pantoprazole (PROTONIX) 40 MG tablet TAKE 1 TABLET BY MOUTH DAILY BEFORE BREAKFAST 90 tablet 3  . prochlorperazine (COMPAZINE) 10 MG tablet Take 1 tablet (10 mg total) by mouth every 6 (six) hours as needed. 30 tablet 2  . Pseudoephedrine HCl (SUDAFED PO) Take 1 tablet by mouth daily. 2 week trial for ringing/congestion in ears    . traMADol (ULTRAM) 50 MG tablet TAKE HALF TABLET BY MOUTH 2 TIMES DAILY AS NEEDED FOR PAIN 60 tablet 2  . triamcinolone cream (KENALOG) 0.5 % Apply 1 application topically 3 (three) times daily. 90 g 1   No current facility-administered medications for  this visit.    SURGICAL HISTORY:  Past Surgical History:  Procedure Laterality Date  . BRONCHIAL BIOPSY  04/12/2020   Procedure: BRONCHIAL BIOPSIES;  Surgeon: Marshell Garfinkel, MD;  Location: [place];  Service: Cardiopulmonary;;  . BRONCHIAL BRUSHINGS  04/12/2020   Procedure: BRONCHIAL BRUSHINGS;  Surgeon: Marshell Garfinkel, MD;  Location: [place];  Service: Cardiopulmonary;;  . BRONCHIAL NEEDLE ASPIRATION BIOPSY  04/12/2020   Procedure: BRONCHIAL NEEDLE ASPIRATION BIOPSIES;  Surgeon: Marshell Garfinkel, MD;  Location: [place];  Service: Cardiopulmonary;;  . BRONCHIAL WASHINGS  04/12/2020   Procedure: BRONCHIAL WASHINGS;  Surgeon: Marshell Garfinkel, MD;  Location: [facility];  Service: Cardiopulmonary;;  . COLONOSCOPY    . COLONOSCOPY W/ POLYPECTOMY  04-19-2012  . ENDOBRONCHIAL ULTRASOUND N/A 04/12/2020   Procedure: ENDOBRONCHIAL ULTRASOUND;  Surgeon: Marshell Garfinkel, MD;  Location: [place];  Service: Cardiopulmonary;  Laterality: N/A;  . ESOPHAGOGASTRODUODENOSCOPY  08-05-2010  . HEMOSTASIS CONTROL  04/12/2020   Procedure: HEMOSTASIS CONTROL;  Surgeon: Marshell Garfinkel, MD;  Location: [place];  Service: Cardiopulmonary;;  . IR IMAGING GUIDED PORT INSERTION  05/31/2020  . POLYPECTOMY    . PROSTATE BIOPSY  10/02/14  . RADIOACTIVE SEED IMPLANT N/A 01/30/2015   Procedure: RADIOACTIVE SEED IMPLANT;  Surgeon: Rana Snare, MD;  Location: [facility];  Service: Urology;  Laterality: N/A;  . UPPER GASTROINTESTINAL ENDOSCOPY    . VIDEO BRONCHOSCOPY N/A 04/12/2020   Procedure: VIDEO BRONCHOSCOPY WITHOUT FLUORO;  Surgeon: Marshell Garfinkel, MD;  Location: [place] [facility];  Service: Cardiopulmonary;  Laterality: N/A;    REVIEW OF SYSTEMS:  A comprehensive review of systems was negative except for: Respiratory: positive for dyspnea on exertion Gastrointestinal: positive for dysphagia   PHYSICAL EXAMINATION: General appearance: alert, cooperative and no distress Head:  Normocephalic, without obvious abnormality, atraumatic Neck: no adenopathy, no JVD, supple, symmetrical, trachea midline and thyroid not enlarged, symmetric, no tenderness/mass/nodules Lymph nodes: Cervical, supraclavicular, and axillary nodes normal. Resp: clear to auscultation bilaterally Back: symmetric, no curvature. ROM normal. No CVA tenderness. Cardio: regular rate and rhythm, S1, S2 normal, no murmur, click, rub or gallop GI: soft, non-tender; bowel sounds normal; no masses,  no organomegaly Extremities: extremities normal, atraumatic, no cyanosis or edema  ECOG PERFORMANCE STATUS: 1 - Symptomatic but completely ambulatory  Blood pressure 105/76, pulse 97, temperature (!) 97.1 F (36.2 C), temperature source Tympanic, resp. rate 19, height 6' (1.829 m), weight 152 lb 1.6 oz (69 kg), SpO2 100 %.  LABORATORY DATA: Lab Results  Component Value Date   WBC 5.8 03/19/2021   HGB 13.1 03/19/2021   HCT 37.8 (L) 03/19/2021   MCV 101.6 (H) 03/19/2021   PLT 207 03/19/2021      Chemistry      Component Value Date/Time   NA 139 02/19/2021 1112   K 3.8  02/19/2021 1112   CL 101 02/19/2021 1112   CO2 26 02/19/2021 1112   BUN 13 02/19/2021 1112   CREATININE 0.88 02/19/2021 1112      Component Value Date/Time   CALCIUM 9.2 02/19/2021 1112   ALKPHOS 88 02/19/2021 1112   AST 61 (H) 02/19/2021 1112   ALT 62 (H) 02/19/2021 1112   BILITOT 0.7 02/19/2021 1112       RADIOGRAPHIC STUDIES: DG ESOPHAGUS W SINGLE CM (SOL OR THIN BA)  Result Date: 02/27/2021 CLINICAL DATA:  Dysphagia. Remote history of esophageal balloon dilatation. Lung cancer, ongoing therapy. EXAM: ESOPHOGRAM / BARIUM SWALLOW / BARIUM TABLET STUDY TECHNIQUE: Combined double contrast and single contrast examination performed using effervescent crystals, thick barium liquid, and thin barium liquid. The patient was observed with fluoroscopy swallowing a 13 mm barium sulphate tablet. FLUOROSCOPY TIME:  Fluoroscopy Time:  2  minutes, 24 seconds Radiation Exposure Index (if provided by the fluoroscopic device): 12.5 mGy Number of Acquired Spot Images: 0 COMPARISON:  Chest CT 01/20/2021 FINDINGS: The pharyngeal phase of swallowing appears normal aside from a mildly prominent cricopharyngeus muscle. No esophageal stricture or narrowing. Mild distal esophageal fold accentuation on mucosal relief assessment, which could reflect low-grade esophagitis. Occasional secondary and tertiary contractions in the esophagus are observed for example on image 61 of series 2. Very small type 1 hiatal hernia. Port-A-Cath visualized. Primary peristaltic waves were intact on 4/4 swallows although there was some proximal escape on 1 of the swallows. A 13 mm barium tablet passed into the stomach with several swallows of water. IMPRESSION: 1. Mild distal esophageal fold accentuation on mucosal relief assessment, which could reflect low-grade esophagitis. 2. Very small type 1 hiatal hernia. 3. No esophageal stricture or narrowing. No significant dysmotility was observed. Electronically Signed   By: Van Clines M.D.   On: 02/27/2021 10:23    ASSESSMENT AND PLAN: This is a very pleasant 60 years old white male recently diagnosed with extensive stage small cell lung cancer presented with left upper lobe lung mass in addition to left hilar and mediastinal lymphadenopathy as well as solitary brain metastasis diagnosed in May 2021. He underwent palliative radiotherapy to the left hilar and mediastinal lymphadenopathy in addition to the radiotherapy to the brain metastasis.  He continues to have residual odynophagia and dysphagia from the palliative radiotherapy. He is currently undergoing systemic chemotherapy with carboplatin for AUC of 5 on day 1, etoposide 100 mg/M2 on days 1, 2 and 3 with Cosela infusion before chemotherapy.  He is status post 11 cycles of treatment  Starting from cycle #5 the patient is on treatment with maintenance Imfinzi every 4  weeks. The patient continues to tolerate his treatment with maintenance Imfinzi fairly well. I recommended for him to proceed with cycle #12 today as planned. I will see him back for follow-up visit in 4 weeks for evaluation with repeat CT scan of the chest, abdomen pelvis for restaging of his disease. He was advised to call immediately if he has any other concerning symptoms in the interval. For the history of dysphagia, he will see Dr. Carlean Purl for evaluation and consideration of esophageal dilatation.  The patient voices understanding of current disease status and treatment options and is in agreement with the current care plan.  All questions were answered. The patient knows to call the clinic with any problems, questions or concerns. We can certainly see the patient much sooner if necessary.   Disclaimer: This note was dictated with voice recognition software. Similar sounding  words can inadvertently be transcribed and may not be corrected upon review.

## 2021-03-19 NOTE — Patient Instructions (Signed)
Implanted Port Insertion, Care After This sheet gives you information about how to care for yourself after your procedure. Your health care provider may also give you more specific instructions. If you have problems or questions, contact your health care provider. What can I expect after the procedure? After the procedure, it is common to have:  Discomfort at the port insertion site.  Bruising on the skin over the port. This should improve over 3-4 days. Follow these instructions at home: Port care  After your port is placed, you will get a manufacturer's information card. The card has information about your port. Keep this card with you at all times.  Take care of the port as told by your health care provider. Ask your health care provider if you or a family member can get training for taking care of the port at home. A home health care nurse may also take care of the port.  Make sure to remember what type of port you have. Incision care  Follow instructions from your health care provider about how to take care of your port insertion site. Make sure you: ? Wash your hands with soap and water before and after you change your bandage (dressing). If soap and water are not available, use hand sanitizer. ? Change your dressing as told by your health care provider. ? Leave stitches (sutures), skin glue, or adhesive strips in place. These skin closures may need to stay in place for 2 weeks or longer. If adhesive strip edges start to loosen and curl up, you may trim the loose edges. Do not remove adhesive strips completely unless your health care provider tells you to do that.  Check your port insertion site every day for signs of infection. Check for: ? Redness, swelling, or pain. ? Fluid or blood. ? Warmth. ? Pus or a bad smell.      Activity  Return to your normal activities as told by your health care provider. Ask your health care provider what activities are safe for you.  Do not  lift anything that is heavier than 10 lb (4.5 kg), or the limit that you are told, until your health care provider says that it is safe. General instructions  Take over-the-counter and prescription medicines only as told by your health care provider.  Do not take baths, swim, or use a hot tub until your health care provider approves. Ask your health care provider if you may take showers. You may only be allowed to take sponge baths.  Do not drive for 24 hours if you were given a sedative during your procedure.  Wear a medical alert bracelet in case of an emergency. This will tell any health care providers that you have a port.  Keep all follow-up visits as told by your health care provider. This is important. Contact a health care provider if:  You cannot flush your port with saline as directed, or you cannot draw blood from the port.  You have a fever or chills.  You have redness, swelling, or pain around your port insertion site.  You have fluid or blood coming from your port insertion site.  Your port insertion site feels warm to the touch.  You have pus or a bad smell coming from the port insertion site. Get help right away if:  You have chest pain or shortness of breath.  You have bleeding from your port that you cannot control. Summary  Take care of the port as told by your   health care provider. Keep the manufacturer's information card with you at all times.  Change your dressing as told by your health care provider.  Contact a health care provider if you have a fever or chills or if you have redness, swelling, or pain around your port insertion site.  Keep all follow-up visits as told by your health care provider. This information is not intended to replace advice given to you by your health care provider. Make sure you discuss any questions you have with your health care provider. Document Revised: 06/07/2018 Document Reviewed: 06/07/2018 Elsevier Patient Education   2021 [employer]  

## 2021-04-01 ENCOUNTER — Ambulatory Visit: Payer: 59 | Admitting: Internal Medicine

## 2021-04-03 ENCOUNTER — Other Ambulatory Visit: Payer: Self-pay

## 2021-04-03 ENCOUNTER — Encounter: Payer: Self-pay | Admitting: Internal Medicine

## 2021-04-03 ENCOUNTER — Ambulatory Visit (AMBULATORY_SURGERY_CENTER): Payer: 59 | Admitting: Internal Medicine

## 2021-04-03 VITALS — BP 116/77 | HR 78 | Temp 98.6°F | Resp 15 | Ht 72.0 in | Wt 153.0 lb

## 2021-04-03 DIAGNOSIS — R1319 Other dysphagia: Secondary | ICD-10-CM | POA: Diagnosis not present

## 2021-04-03 DIAGNOSIS — K449 Diaphragmatic hernia without obstruction or gangrene: Secondary | ICD-10-CM

## 2021-04-03 DIAGNOSIS — K222 Esophageal obstruction: Secondary | ICD-10-CM

## 2021-04-03 DIAGNOSIS — K219 Gastro-esophageal reflux disease without esophagitis: Secondary | ICD-10-CM | POA: Diagnosis not present

## 2021-04-03 DIAGNOSIS — K208 Other esophagitis without bleeding: Secondary | ICD-10-CM | POA: Diagnosis not present

## 2021-04-03 HISTORY — PX: UPPER GASTROINTESTINAL ENDOSCOPY: SHX188

## 2021-04-03 MED ORDER — SODIUM CHLORIDE 0.9 % IV SOLN: 500.0000 mL | Freq: Once | INTRAVENOUS | Status: DC

## 2021-04-03 NOTE — Progress Notes (Signed)
Report to PACU, RN, vss, BBS= Clear.  

## 2021-04-03 NOTE — Progress Notes (Signed)
Called to room to assist during endoscopic procedure.  Patient ID and intended procedure confirmed with present staff. Received instructions for my participation in the procedure from the performing physician.  

## 2021-04-03 NOTE — Progress Notes (Signed)
Pt's states no medical or surgical changes since previsit or office visit.  Vitals [place] 

## 2021-04-03 NOTE — Patient Instructions (Addendum)
There was some narrowing where the esophagus and stomach meet - I opened that up and also took biopsies in the mid and upper esophagus. 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  I hope you are able to swallow better.  I appreciate the opportunity to care for you. Gatha Mayer, MD, [facility]  Hiatal hernia and dilation diet handouts given to patient.  YOU HAD AN ENDOSCOPIC PROCEDURE TODAY AT [facility]:   Refer to the procedure report that was given to you for any specific questions about what was found during the examination.  If the procedure report does not answer your questions, please call your gastroenterologist to clarify.  If you requested that your care partner not be given the details of your procedure findings, then the procedure report has been included in a sealed envelope for you to review at your convenience later.  YOU SHOULD EXPECT: Some feelings of bloating in the abdomen. Passage of more gas than usual.  Walking can help get rid of the air that was put into your GI tract during the procedure and reduce the bloating. If you had a lower endoscopy (such as a colonoscopy or flexible sigmoidoscopy) you may notice spotting of blood in your stool or on the toilet paper. If you underwent a bowel prep for your procedure, you may not have a normal bowel movement for a few days.  Please Note:  You might notice some irritation and congestion in your nose or some drainage.  This is from the oxygen used during your procedure.  There is no need for concern and it should clear up in a day or so.  SYMPTOMS TO REPORT IMMEDIATELY:  Following upper endoscopy (EGD)  Vomiting of blood or coffee ground material  New chest pain or pain under the shoulder blades  Painful or persistently difficult swallowing  New shortness of breath  Fever of 100F or higher  Black, tarry-looking stools  For urgent or emergent issues, a gastroenterologist can be reached at any hour by calling  [phone]. Do not use MyChart messaging for urgent concerns.    DIET:  We do recommend a small meal at first, but then you may proceed to your regular diet.  Drink plenty of fluids but you should avoid alcoholic beverages for 24 hours.  ACTIVITY:  You should plan to take it easy for the rest of today and you should NOT DRIVE or use heavy machinery until tomorrow (because of the sedation medicines used during the test).    FOLLOW UP: Our staff will call the number listed on your records 48-72 hours following your procedure to check on you and address any questions or concerns that you may have regarding the information given to you following your procedure. If we do not reach you, we will leave a message.  We will attempt to reach you two times.  During this call, we will ask if you have developed any symptoms of COVID 19. If you develop any symptoms (ie: fever, flu-like symptoms, shortness of breath, cough etc.) before then, please call [phone].  If you test positive for Covid 19 in the 2 weeks post procedure, please call and report this information to [place].    If any biopsies were taken you will be contacted by phone or by letter within the next 1-3 weeks.  Please call us at [phone] if you have not heard about the biopsies in 3 weeks.    SIGNATURES/CONFIDENTIALITY: You and/or your care  partner have signed paperwork which will be entered into your electronic medical record.  These signatures attest to the fact that that the information above on your After Visit Summary has been reviewed and is understood.  Full responsibility of the confidentiality of this discharge information lies with you and/or your care-partner.

## 2021-04-03 NOTE — Op Note (Signed)
[place] Patient Name: Richard Santos Procedure Date: 04/03/2021 10:31 AM MRN: [id number] Endoscopist: Gatha Mayer , MD Age: 60 Referring MD:  Date of Birth: 14-Oct-1961 Gender: Male Account #: [id number] Procedure:                Upper GI endoscopy Indications:              Dysphagia Medicines:                Propofol per Anesthesia, Monitored Anesthesia Care Procedure:                Pre-Anesthesia Assessment:                           - Prior to the procedure, a History and Physical                            was performed, and patient medications and                            allergies were reviewed. The patient's tolerance of                            previous anesthesia was also reviewed. The risks                            and benefits of the procedure and the sedation                            options and risks were discussed with the patient.                            All questions were answered, and informed consent                            was obtained. Prior Anticoagulants: The patient has                            taken no previous anticoagulant or antiplatelet                            agents. ASA Grade Assessment: III - A patient with                            severe systemic disease. After reviewing the risks                            and benefits, the patient was deemed in                            satisfactory condition to undergo the procedure.                           After obtaining informed consent, the endoscope was  passed under direct vision. Throughout the                            procedure, the patient's blood pressure, pulse, and                            oxygen saturations were monitored continuously. The                            Endoscope was introduced through the mouth, and                            advanced to the second part of duodenum. The upper                            GI endoscopy was  accomplished without difficulty.                            The patient tolerated the procedure well. Scope In: Scope Out: Findings:                 Diffuse mild inflammation was found in the proximal                            esophagus and in the mid esophagus. Transient                            ring-like chage seen. Biopsies were taken with a                            cold forceps for histology. Verification of patient                            identification for the specimen was done. Estimated                            blood loss was minimal.                           A non-obstructing Schatzki ring was found at the                            gastroesophageal junction. A TTS dilator was passed                            through the scope. Dilation with a 16-17-18 mm                            balloon dilator was performed to 18 mm. It was                            pulled retrograde at 16, 17 and 18 mm through  entire esopghagus w/o dilation effects The dilation                            site was examined and showed no change. Estimated                            blood loss: none. Biopsies were taken with a cold                            forceps for histology. Verification of patient                            identification for the specimen was done. Estimated                            blood loss was minimal.                           The Z-line was irregular.                           A 1 cm hiatal hernia was present.                           The exam was otherwise without abnormality.                           The cardia and gastric fundus were normal on                            retroflexion. Complications:            No immediate complications. Estimated Blood Loss:     Estimated blood loss was minimal. Impression:               - Esophageal mucosal changes were present. 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 Biopsied.                           - Non-obstructing Schatzki ring. Dilated this and                            entire esophagus w/o effect Biopsied and disrupted.                           - Z-line irregular.                           - 1 cm hiatal hernia.                           - The examination was otherwise normal. Recommendation:           - Patient has a contact number available for  emergencies. The signs and symptoms of potential                            delayed complications were discussed with the                            patient. Return to normal activities tomorrow.                            Written discharge instructions were provided to the                            patient.                           - Clear liquids x 1 hour then soft foods rest of                            day. Start prior diet tomorrow.                           - Continue present medications.                           - Await pathology results. cc: Shona Simpson, PA-C Gatha Mayer, MD 04/03/2021 11:03:19 AM This report has been signed electronically.

## 2021-04-07 ENCOUNTER — Telehealth: Payer: Self-pay | Admitting: *Deleted

## 2021-04-07 ENCOUNTER — Telehealth: Payer: Self-pay

## 2021-04-07 NOTE — Telephone Encounter (Signed)
  Follow up Call-  Call back number 04/03/2021  Post procedure Call Back phone  # [phone]  Permission to leave phone message Yes  Some recent data might be hidden     Left message

## 2021-04-07 NOTE — Telephone Encounter (Signed)
No answer for post procedure call back. Left message for patient to call with questions or concerns. 

## 2021-04-16 ENCOUNTER — Other Ambulatory Visit: Payer: 59

## 2021-04-16 ENCOUNTER — Ambulatory Visit: Payer: 59

## 2021-04-16 ENCOUNTER — Ambulatory Visit: Payer: 59 | Admitting: Physician Assistant

## 2021-04-17 NOTE — Progress Notes (Signed)
[facility] OFFICE PROGRESS NOTE  [name], MD [place] [address]  DIAGNOSIS:  Extensive stage (T2b, N2, M1c)small cell lung cancer presented with left upper lobe pulmonary nodule in addition to left hilar mass with mediastinal invasion and solitary brain metastasis diagnosed in May 2021  PRIOR THERAPY:  1) Palliative radiation to the left hilar and mediastinal lymphadenopathy under the care of Dr. Lisbeth Renshaw. 2) Cranial irradiation from 08/26/20-09/06/20 under the care of Dr. Lisbeth Renshaw  CURRENT THERAPY: Palliative systemic chemotherapy with carboplatin for AUC of 5 on day 1, etoposide 100 mg/M2 on days 1, 2 and 3 as well as Imfinzi 1500 mg IV every 3 weeks with the chemotherapy.  First dose of chemotherapy May 06, 2020.  The patient will receive treatment with Cosela on the days of his chemotherapy.  Status post 12 cycles.  Starting from cycle #5 he started maintenance immunotherapy with Imfinzi 1500 mg IV every 4 weeks.    INTERVAL HISTORY: Richard Santos 60 y.o. male returns to the clinic today for a follow-up visit.  The patient is feeling fairly well today without any concerning complaints.  In the interval since his last appointment, the patient was seen by his gastroenterologist and had a upper endoscopy performed to evaluate dysphagia.  There was some findings of radiation inflammation in his esophagus and a nonobstructing Schatzki ring which was dilated. His swallowing has improved somewhat at this time.   Otherwise, the patient tolerated his last cycle of treatment well.  He denies any fever, chills, night sweats, or weight loss.  He denies any chest pain, cough, or hemoptysis. He denies significant shortness of breath.  He denies any nausea, vomiting, diarrhea, or constipation.  He denies any headaches or visual changes except he notices he needs to use his reading glasses otherwise his vision is weaker.  He denies any rashes or skin changes.  The patient  recently had a restaging CT scan performed.  The patient is here today for evaluation and to review his scan results before starting cycle #13.   MEDICAL HISTORY: Past Medical History:  Diagnosis Date  . Allergic rhinitis   . Allergy   . Anxiety   . Chronic low back pain   . DDD (degenerative disc disease), lumbar   . ED (erectile dysfunction)   . Finger injury    cut pads off 3rd and 4th finger left hand/due to lawn mower accident  . GERD (gastroesophageal reflux disease)   . Heart murmur    as child only-   . History of kidney stones   . HTN (hypertension) 08/20/2016  . Hyperlipidemia   . Incomplete right bundle branch block   . Prostate cancer ([place]) dx 10/02/14   stage T1c  . Sigmoid diverticulosis   . Small cell lung cancer ([place]) 03/2020  . Wears glasses     ALLERGIES:  is allergic to bee venom, elemental sulfur, sulfa antibiotics, and namenda [memantine].  MEDICATIONS:  Current Outpatient Medications  Medication Sig Dispense Refill  . acetaminophen (TYLENOL) 325 MG tablet Take 2 tablets (650 mg total) by mouth every 6 (six) hours as needed for mild pain (or Fever >/= 101). 30 tablet 0  . aspirin EC 81 MG tablet Take 81 mg by mouth daily. Swallow whole.    Marland Kitchen BREZTRI AEROSPHERE 160-9-4.8 MCG/ACT AERO INHALE 2 PUFFS INTO THE LUNGS IN THE MORNING AND AT BEDTIME. 10.7 g 10  . busPIRone (BUSPAR) 10 MG tablet TAKE 1 TABLET BY  MOUTH TWICE A DAY 180 tablet 3  . lidocaine-prilocaine (EMLA) cream Apply 1 application topically as needed. 30 g 2  . meloxicam (MOBIC) 15 MG tablet Take 15 mg by mouth daily.    . Multiple Vitamin (MULTIVITAMIN) capsule Take 1 capsule by mouth daily.    . ondansetron (ZOFRAN-ODT) 8 MG disintegrating tablet Take 1 tablet (8 mg total) by mouth every 8 (eight) hours as needed for nausea or vomiting. 30 tablet 2  . pantoprazole (PROTONIX) 40 MG tablet TAKE 1 TABLET BY MOUTH DAILY BEFORE BREAKFAST 90 tablet 3  . prochlorperazine (COMPAZINE) 10 MG tablet Take  1 tablet (10 mg total) by mouth every 6 (six) hours as needed. 30 tablet 2  . Pseudoephedrine HCl (SUDAFED PO) Take 1 tablet by mouth daily. 2 week trial for ringing/congestion in ears    . traMADol (ULTRAM) 50 MG tablet TAKE HALF TABLET BY MOUTH 2 TIMES DAILY AS NEEDED FOR PAIN 60 tablet 2  . triamcinolone cream (KENALOG) 0.5 % Apply 1 application topically 3 (three) times daily. 90 g 1   Current Facility-Administered Medications  Medication Dose Route Frequency Provider Last Rate Last Admin  . 0.9 %  sodium chloride infusion  500 mL Intravenous Once Gatha Mayer, MD        SURGICAL HISTORY:  Past Surgical History:  Procedure Laterality Date  . BRONCHIAL BIOPSY  04/12/2020   Procedure: BRONCHIAL BIOPSIES;  Surgeon: Marshell Garfinkel, MD;  Location: [facility];  Service: Cardiopulmonary;;  . BRONCHIAL BRUSHINGS  04/12/2020   Procedure: BRONCHIAL BRUSHINGS;  Surgeon: Marshell Garfinkel, MD;  Location: [facility];  Service: Cardiopulmonary;;  . BRONCHIAL NEEDLE ASPIRATION BIOPSY  04/12/2020   Procedure: BRONCHIAL NEEDLE ASPIRATION BIOPSIES;  Surgeon: Marshell Garfinkel, MD;  Location: [facility];  Service: Cardiopulmonary;;  . BRONCHIAL WASHINGS  04/12/2020   Procedure: BRONCHIAL WASHINGS;  Surgeon: Marshell Garfinkel, MD;  Location: [facility];  Service: Cardiopulmonary;;  . COLONOSCOPY    . COLONOSCOPY W/ POLYPECTOMY  04-19-2012  . ENDOBRONCHIAL ULTRASOUND N/A 04/12/2020   Procedure: ENDOBRONCHIAL ULTRASOUND;  Surgeon: Marshell Garfinkel, MD;  Location: [facility];  Service: Cardiopulmonary;  Laterality: N/A;  . ESOPHAGOGASTRODUODENOSCOPY  08-05-2010  . HEMOSTASIS CONTROL  04/12/2020   Procedure: HEMOSTASIS CONTROL;  Surgeon: Marshell Garfinkel, MD;  Location: [facility];  Service: Cardiopulmonary;;  . IR IMAGING GUIDED PORT INSERTION  05/31/2020  . POLYPECTOMY    . PROSTATE BIOPSY  10/02/14  . RADIOACTIVE SEED IMPLANT N/A 01/30/2015   Procedure: RADIOACTIVE SEED IMPLANT;  Surgeon: Rana Snare,  MD;  Location[facility];  Service: Urology;  Laterality: N/A;  . UPPER GASTROINTESTINAL ENDOSCOPY  04/03/2021  . VIDEO BRONCHOSCOPY N/A 04/12/2020   Procedure: VIDEO BRONCHOSCOPY WITHOUT FLUORO;  Surgeon: Marshell Garfinkel, MD;  Location: [facility];  Service: Cardiopulmonary;  Laterality: N/A;    REVIEW OF SYSTEMS:   Review of Systems  Constitutional: Negative for appetite change, chills, fatigue, fever and unexpected weight change.  HENT: Negative for mouth sores, nosebleeds, sore throat and trouble swallowing.   Eyes: Negative for eye problems and icterus.  Respiratory: Negative for cough, hemoptysis, shortness of breath and wheezing.   Cardiovascular: Negative for chest pain and leg swelling.  Gastrointestinal: Negative for abdominal pain, constipation, diarrhea, nausea and vomiting.  Genitourinary: Negative for bladder incontinence, difficulty urinating, dysuria, frequency and hematuria.   Musculoskeletal: Negative for back pain, gait problem, neck pain and neck stiffness.  Skin: Negative for itching and rash.  Neurological: Negative for dizziness, extremity weakness, gait problem, headaches,  light-headedness and seizures.  Hematological: Negative for adenopathy. Does not bruise/bleed easily.  Psychiatric/Behavioral: Negative for confusion, depression and sleep disturbance. The patient is not nervous/anxious.     PHYSICAL EXAMINATION:  Blood pressure 120/71, pulse 78, temperature (!) 96.9 F (36.1 C), temperature source Tympanic, resp. rate 18, height 6' (1.829 m), weight 154 lb 9.6 oz (70.1 kg), SpO2 100 %.  ECOG PERFORMANCE STATUS: 1 - Symptomatic but completely ambulatory  Physical Exam  Constitutional: Oriented to person, place, and time and well-developed, well-nourished, and in no distress.  HENT:  Head: Normocephalic and atraumatic.  Mouth/Throat: Oropharynx is clear and moist. No oropharyngeal exudate.  Eyes: Conjunctivae are normal. Right eye exhibits  no discharge. Left eye exhibits no discharge. No scleral icterus.  Neck: Normal range of motion. Neck supple.  Cardiovascular: Normal rate, regular rhythm, normal heart sounds and intact distal pulses.   Pulmonary/Chest: Effort normal and breath sounds normal. No respiratory distress. No wheezes. No rales.  Abdominal: Soft. Bowel sounds are normal. Exhibits no distension and no mass. There is no tenderness.  Musculoskeletal: Normal range of motion. Exhibits no edema.  Lymphadenopathy:    No cervical adenopathy.  Neurological: Alert and oriented to person, place, and time. Exhibits normal muscle tone. Gait normal. Coordination normal.  Skin: Skin is warm and dry. No rash noted. Not diaphoretic. No erythema. No pallor.  Psychiatric: Mood, memory and judgment normal.  Vitals reviewed.  LABORATORY DATA: Lab Results  Component Value Date   WBC 4.8 04/22/2021   HGB 11.2 (L) 04/22/2021   HCT 31.6 (L) 04/22/2021   MCV 100.6 (H) 04/22/2021   PLT 240 04/22/2021      Chemistry      Component Value Date/Time   NA 137 03/19/2021 0900   K 4.4 03/19/2021 0900   CL 101 03/19/2021 0900   CO2 26 03/19/2021 0900   BUN 15 03/19/2021 0900   CREATININE 0.95 03/19/2021 0900      Component Value Date/Time   CALCIUM 9.7 03/19/2021 0900   ALKPHOS 72 03/19/2021 0900   AST 55 (H) 03/19/2021 0900   ALT 48 (H) 03/19/2021 0900   BILITOT 0.6 03/19/2021 0900       RADIOGRAPHIC STUDIES:  CT Chest W Contrast  Result Date: 04/20/2021 CLINICAL DATA:  Small-cell lung cancer restaging, metastatic to brain, weight loss EXAM: CT CHEST, ABDOMEN, AND PELVIS WITH CONTRAST TECHNIQUE: Multidetector CT imaging of the chest, abdomen and pelvis was performed following the standard protocol during bolus administration of intravenous contrast. CONTRAST:  143mL OMNIPAQUE IOHEXOL 300 MG/ML SOLN, additional oral enteric contrast COMPARISON:  01/20/2021 FINDINGS: CT CHEST FINDINGS Cardiovascular: Scattered aortic  atherosclerosis. Normal heart size. Three-vessel coronary artery calcification. No pericardial effusion. Mediastinum/Nodes: No enlarged mediastinal, hilar, or axillary lymph nodes. Thyroid gland, trachea, and esophagus demonstrate no significant findings. Lungs/Pleura: Unchanged lobulated nodule of the peripheral left upper lobe subtle internal cavitation, measuring 2.1 x 1.2 cm (series 4, image 42). Extensive perihilar radiation fibrosis, with interval increase in consolidation, bronchiectasis, and volume loss. Redemonstrated cavitary lesion of the medial lingula, which is now relatively thin walled and enlarged, measuring 4.3 x 2.5 cm (series 4, image 63). No pleural effusion or pneumothorax. Musculoskeletal: No chest wall mass or suspicious bone lesions identified. CT ABDOMEN PELVIS FINDINGS Hepatobiliary: No solid liver abnormality is seen. No gallstones, gallbladder wall thickening, or biliary dilatation. Pancreas: Unremarkable. No pancreatic ductal dilatation or surrounding inflammatory changes. Spleen: Normal in size without significant abnormality. Adrenals/Urinary Tract: Interval enlargement of multiple left adrenal nodules,  largest inferiorly lateral limb measuring 2.8 x 2.6 cm, previously 1.3 x 1.2 cm (series 2, image 67). Small nonobstructive left renal calculus. No ureteral calculi or hydronephrosis. Bladder is unremarkable. Stomach/Bowel: Stomach is within normal limits. Appendix appears normal. No evidence of bowel wall thickening, distention, or inflammatory changes. Sigmoid diverticulosis Vascular/Lymphatic: Aortic atherosclerosis. No enlarged abdominal or pelvic lymph nodes. Reproductive: Prostate brachytherapy pellets. Other: No abdominal wall hernia or abnormality. No abdominopelvic ascites. Musculoskeletal: No acute or significant osseous findings. IMPRESSION: 1. Unchanged lobulated nodule of the peripheral left upper lobe, consistent with treated primary lung malignancy. 2. Interval evolution  of extensive perihilar radiation fibrosis of the left lung. 3. Redemonstrated cavitary lesion of the medial lingula, which is now relatively thin walled and enlarged, measuring 4.3 x 2.5 cm. 4. Interval enlargement of multiple left adrenal nodules, consistent with worsened metastatic disease. 5. No other evidence of metastatic disease in the chest, abdomen or pelvis. 6. Coronary artery disease. 7. Nonobstructive left nephrolithiasis. Aortic Atherosclerosis (ICD10-I70.0). Electronically Signed   By: Eddie Candle M.D.   On: 04/20/2021 14:39   CT Abdomen Pelvis W Contrast  Result Date: 04/20/2021 CLINICAL DATA:  Small-cell lung cancer restaging, metastatic to brain, weight loss EXAM: CT CHEST, ABDOMEN, AND PELVIS WITH CONTRAST TECHNIQUE: Multidetector CT imaging of the chest, abdomen and pelvis was performed following the standard protocol during bolus administration of intravenous contrast. CONTRAST:  159mL OMNIPAQUE IOHEXOL 300 MG/ML SOLN, additional oral enteric contrast COMPARISON:  01/20/2021 FINDINGS: CT CHEST FINDINGS Cardiovascular: Scattered aortic atherosclerosis. Normal heart size. Three-vessel coronary artery calcification. No pericardial effusion. Mediastinum/Nodes: No enlarged mediastinal, hilar, or axillary lymph nodes. Thyroid gland, trachea, and esophagus demonstrate no significant findings. Lungs/Pleura: Unchanged lobulated nodule of the peripheral left upper lobe subtle internal cavitation, measuring 2.1 x 1.2 cm (series 4, image 42). Extensive perihilar radiation fibrosis, with interval increase in consolidation, bronchiectasis, and volume loss. Redemonstrated cavitary lesion of the medial lingula, which is now relatively thin walled and enlarged, measuring 4.3 x 2.5 cm (series 4, image 63). No pleural effusion or pneumothorax. Musculoskeletal: No chest wall mass or suspicious bone lesions identified. CT ABDOMEN PELVIS FINDINGS Hepatobiliary: No solid liver abnormality is seen. No gallstones,  gallbladder wall thickening, or biliary dilatation. Pancreas: Unremarkable. No pancreatic ductal dilatation or surrounding inflammatory changes. Spleen: Normal in size without significant abnormality. Adrenals/Urinary Tract: Interval enlargement of multiple left adrenal nodules, largest inferiorly lateral limb measuring 2.8 x 2.6 cm, previously 1.3 x 1.2 cm (series 2, image 67). Small nonobstructive left renal calculus. No ureteral calculi or hydronephrosis. Bladder is unremarkable. Stomach/Bowel: Stomach is within normal limits. Appendix appears normal. No evidence of bowel wall thickening, distention, or inflammatory changes. Sigmoid diverticulosis Vascular/Lymphatic: Aortic atherosclerosis. No enlarged abdominal or pelvic lymph nodes. Reproductive: Prostate brachytherapy pellets. Other: No abdominal wall hernia or abnormality. No abdominopelvic ascites. Musculoskeletal: No acute or significant osseous findings. IMPRESSION: 1. Unchanged lobulated nodule of the peripheral left upper lobe, consistent with treated primary lung malignancy. 2. Interval evolution of extensive perihilar radiation fibrosis of the left lung. 3. Redemonstrated cavitary lesion of the medial lingula, which is now relatively thin walled and enlarged, measuring 4.3 x 2.5 cm. 4. Interval enlargement of multiple left adrenal nodules, consistent with worsened metastatic disease. 5. No other evidence of metastatic disease in the chest, abdomen or pelvis. 6. Coronary artery disease. 7. Nonobstructive left nephrolithiasis. Aortic Atherosclerosis (ICD10-I70.0). Electronically Signed   By: Eddie Candle M.D.   On: 04/20/2021 14:39     ASSESSMENT/PLAN:  This  is a very pleasant 60 year old Caucasian male diagnosed with extensive stage small cell lung cancer.  He presented with a left upper lobe lung mass in addition to left hilar mediastinal lymphadenopathy.  He also had metastatic disease to the brain.  He was diagnosed in May 2021.  He underwent  palliative radiotherapy to the left hilar and mediastinal lymphadenopathy in addition to radiotherapy to the brain.   He is currently undergoing systemic chemotherapy with carboplatin for an AUC of 5 on day 1, etoposide 100 mg per metered squared on days 1, 2, and 3 with cosela infusion before chemotherapy.  He is status post 12 cycles of treatment.  Starting from cycle #5, the patient is on maintenance immunotherapy IV every 4 weeks.   He is tolerating this well.   The patient recently had a restaging CT scan performed.  Dr. Julien Nordmann personally and independently reviewed the scan discussed the results with the patient today.  The scan showed interval enlargement of multiple left adrenal nodules, consistent with worsened metastatic disease. The cavitary lesion in the lingula has enlarged.  Dr. Julien Nordmann recommends that we refer him to radiation oncology to consider him for radiation to the new adrenal gland nodules. Dr. Julien Nordmann recommends that we obtain a restaging CT scan of the chest, abdomen, and pelvis in a shorter interval. Therefore, we will re-scan him in 2 months. If he has evidence for disease progression at that time, we will likely need to discuss starting him on chemotherapy.   He will proceed with cycle #13 today scheduled.  We will see him back for follow-up visit in 4 weeks for evaluation before starting cycle #14.  The patient was advised to call immediately if he has any concerning symptoms in the interval. The patient voices understanding of current disease status and treatment options and is in agreement with the current care plan. All questions were answered. The patient knows to call the clinic with any problems, questions or concerns. We can certainly see the patient much sooner if necessary      Orders Placed This Encounter  Procedures  . Ambulatory referral to Radiation Oncology    Referral Priority:   Routine    Referral Type:   Consultation    Referral Reason:    Specialty Services Required    Requested Specialty:   Radiation Oncology    Number of Visits Requested:   [place], PA-C 04/22/21  ADDENDUM: Hematology/Oncology Attending: I had a face-to-face encounter with the patient today.  I reviewed his record, scan and recommended his care plan.  This is a very pleasant 60 years old white male with extensive stage small cell lung cancer status post induction systemic chemotherapy with carboplatin, etoposide and Imfinzi and he is currently on treatment with maintenance Imfinzi status post 8 more cycles.  The patient has been tolerating this treatment well with no concerning complaints.  He recently underwent esophageal dilatation and eating much better. He had repeat CT scan of the chest, abdomen pelvis performed recently.  I personally and independently reviewed the scans and discussed the results with the patient and his wife. His scan showed stable disease except for enlarging left adrenal gland lesion in addition to cavitary lingular lesion. I recommended for the patient to continue his current treatment with immunotherapy with cycle #16 but will refer him to radiation oncology for consideration of palliative radiotherapy to the progressive lesion and the left adrenal gland. Will consider him for repeat imaging  studies in 2 months and if there is any further disease progression, will consider switching his treatment to systemic chemotherapy. The patient and his wife agreed to the current plan. He was advised to call immediately if he has any other concerning symptoms in the interval.  Disclaimer: This note was dictated with voice recognition software. Similar sounding words can inadvertently be transcribed and may be missed upon review. Eilleen Kempf, MD 04/22/21

## 2021-04-18 ENCOUNTER — Other Ambulatory Visit: Payer: Self-pay

## 2021-04-18 ENCOUNTER — Ambulatory Visit (HOSPITAL_COMMUNITY)
Admission: RE | Admit: 2021-04-18 | Discharge: 2021-04-18 | Disposition: A | Discharge status: Home / Self Care | Payer: 59 | Source: Ambulatory Visit | Attending: Internal Medicine | Admitting: Internal Medicine

## 2021-04-18 DIAGNOSIS — C3490 Malignant neoplasm of unspecified part of unspecified bronchus or lung: Secondary | ICD-10-CM

## 2021-04-18 MED ORDER — IOHEXOL 300 MG/ML  SOLN
100.0000 mL | Freq: Once | INTRAMUSCULAR | Status: AC | PRN
  Administered 2021-04-18: 100 mL via INTRAVENOUS

## 2021-04-18 MED ORDER — HEPARIN SOD (PORK) LOCK FLUSH 100 UNIT/ML IV SOLN: 500.0000 [IU] | Freq: Once | INTRAVENOUS | Status: DC

## 2021-04-18 MED ORDER — IOHEXOL 9 MG/ML PO SOLN
500.0000 mL | ORAL | Status: AC
  Administered 2021-04-18 (×2): 500 mL via ORAL

## 2021-04-18 MED ORDER — IOHEXOL 9 MG/ML PO SOLN: 500.0000 mL | ORAL | Status: DC

## 2021-04-22 ENCOUNTER — Inpatient Hospital Stay: Payer: 59

## 2021-04-22 ENCOUNTER — Inpatient Hospital Stay: Payer: 59 | Attending: Internal Medicine | Admitting: Physician Assistant

## 2021-04-22 ENCOUNTER — Other Ambulatory Visit: Payer: Self-pay

## 2021-04-22 ENCOUNTER — Other Ambulatory Visit: Payer: 59

## 2021-04-22 VITALS — BP 120/71 | HR 78 | Temp 96.9°F | Resp 18 | Ht 72.0 in | Wt 154.6 lb

## 2021-04-22 DIAGNOSIS — C3412 Malignant neoplasm of upper lobe, left bronchus or lung: Secondary | ICD-10-CM | POA: Diagnosis not present

## 2021-04-22 DIAGNOSIS — Z7982 Long term (current) use of aspirin: Secondary | ICD-10-CM | POA: Diagnosis not present

## 2021-04-22 DIAGNOSIS — Z5112 Encounter for antineoplastic immunotherapy: Secondary | ICD-10-CM | POA: Diagnosis not present

## 2021-04-22 DIAGNOSIS — Z95828 Presence of other vascular implants and grafts: Secondary | ICD-10-CM

## 2021-04-22 DIAGNOSIS — C7931 Secondary malignant neoplasm of brain: Secondary | ICD-10-CM | POA: Insufficient documentation

## 2021-04-22 DIAGNOSIS — Z79899 Other long term (current) drug therapy: Secondary | ICD-10-CM | POA: Insufficient documentation

## 2021-04-22 LAB — CBC WITH DIFFERENTIAL (CANCER CENTER ONLY)
Abs Immature Granulocytes: 0.03 10*3/uL (ref 0.00–0.07)
Basophils Absolute: 0 10*3/uL (ref 0.0–0.1)
Basophils Relative: 1 %
Eosinophils Absolute: 0.4 10*3/uL (ref 0.0–0.5)
Eosinophils Relative: 8 %
HCT: 31.6 % — ABNORMAL LOW (ref 39.0–52.0)
Hemoglobin: 11.2 g/dL — ABNORMAL LOW (ref 13.0–17.0)
Immature Granulocytes: 1 %
Lymphocytes Relative: 13 %
Lymphs Abs: 0.6 10*3/uL — ABNORMAL LOW (ref 0.7–4.0)
MCH: 35.7 pg — ABNORMAL HIGH (ref 26.0–34.0)
MCHC: 35.4 g/dL (ref 30.0–36.0)
MCV: 100.6 fL — ABNORMAL HIGH (ref 80.0–100.0)
Monocytes Absolute: 0.5 10*3/uL (ref 0.1–1.0)
Monocytes Relative: 9 %
Neutro Abs: 3.3 10*3/uL (ref 1.7–7.7)
Neutrophils Relative %: 68 %
Platelet Count: 240 10*3/uL (ref 150–400)
RBC: 3.14 MIL/uL — ABNORMAL LOW (ref 4.22–5.81)
RDW: 12.6 % (ref 11.5–15.5)
WBC Count: 4.8 10*3/uL (ref 4.0–10.5)
nRBC: 0 % (ref 0.0–0.2)

## 2021-04-22 LAB — CMP (CANCER CENTER ONLY)
ALT: 37 U/L (ref 0–44)
AST: 49 U/L — ABNORMAL HIGH (ref 15–41)
Albumin: 3.9 g/dL (ref 3.5–5.0)
Alkaline Phosphatase: 70 U/L (ref 38–126)
Anion gap: 11 (ref 5–15)
BUN: 7 mg/dL (ref 6–20)
CO2: 27 mmol/L (ref 22–32)
Calcium: 9.9 mg/dL (ref 8.9–10.3)
Chloride: 98 mmol/L (ref 98–111)
Creatinine: 0.75 mg/dL (ref 0.61–1.24)
GFR, Estimated: >60 mL/min (ref >60)
Glucose, Bld: 106 mg/dL — ABNORMAL HIGH (ref 70–99)
Potassium: 4.1 mmol/L (ref 3.5–5.1)
Sodium: 136 mmol/L (ref 135–145)
Total Bilirubin: 0.4 mg/dL (ref 0.3–1.2)
Total Protein: 7.3 g/dL (ref 6.5–8.1)

## 2021-04-22 MED ORDER — HEPARIN SOD (PORK) LOCK FLUSH 100 UNIT/ML IV SOLN
500.0000 [IU] | Freq: Once | INTRAVENOUS | Status: AC | PRN
  Administered 2021-04-22: 500 [IU]
  Filled 2021-04-22: qty 5

## 2021-04-22 MED ORDER — SODIUM CHLORIDE 0.9% FLUSH
10.0000 mL | INTRAVENOUS | Status: DC | PRN
  Administered 2021-04-22: 10 mL
  Filled 2021-04-22: qty 10

## 2021-04-22 MED ORDER — SODIUM CHLORIDE 0.9 % IV SOLN
1500.0000 mg | Freq: Once | INTRAVENOUS | Status: AC
  Administered 2021-04-22: 1500 mg via INTRAVENOUS
  Filled 2021-04-22: qty 30

## 2021-04-22 MED ORDER — SODIUM CHLORIDE 0.9% FLUSH
10.0000 mL | Freq: Once | INTRAVENOUS | Status: AC
  Administered 2021-04-22: 10 mL
  Filled 2021-04-22: qty 10

## 2021-04-22 MED ORDER — SODIUM CHLORIDE 0.9 % IV SOLN
Freq: Once | INTRAVENOUS | Status: AC
  Filled 2021-04-22: qty 250

## 2021-04-22 NOTE — Patient Instructions (Signed)
[facility]ICAL [facility]COLOG[facility]   Discharge Instructions: Thank you for choosing [facility] to provide your oncology and hematology care.   If you have a lab appointment with the Cancer Center, please go directly to the Cancer Center and check in at the registration area.   Wear comfortable clothing and clothing appropriate for easy access to any Portacath or PICC line.   We strive to give you quality time with your provider. You may need to reschedule your appointment if you arrive late (15 or more minutes).  Arriving late affects you and other patients whose appointments are after yours.  Also, if you miss three or more appointments without notifying the office, you may be dismissed from the clinic at the provider's discretion.      For prescription refill requests, have your pharmacy contact our office and allow 72 hours for refills to be completed.    Today you received the following chemotherapy and/or immunotherapy agents: Durvalumab (Imfinzi).      To help prevent nausea and vomiting after your treatment, we encourage you to take your nausea medication as directed.  BELOW ARE SYMPTOMS THAT SHOULD BE REPORTED IMMEDIATELY: *FEVER GREATER THAN 100.4 F (38 C) OR HIGHER *CHILLS OR SWEATING *NAUSEA AND VOMITING THAT IS NOT CONTROLLED WITH YOUR NAUSEA MEDICATION *UNUSUAL SHORTNESS OF BREATH *UNUSUAL BRUISING OR BLEEDING *URINARY PROBLEMS (pain or burning when urinating, or frequent urination) *BOWEL PROBLEMS (unusual diarrhea, constipation, pain near the anus) TENDERNESS IN MOUTH AND THROAT WITH OR WITHOUT PRESENCE OF ULCERS (sore throat, sores in mouth, or a toothache) UNUSUAL RASH, SWELLING OR PAIN  UNUSUAL VAGINAL DISCHARGE OR ITCHING   Items with * indicate a potential emergency and should be followed up as soon as possible or go to the Emergency Department if any problems should occur.  Please show the CHEMOTHERAPY ALERT CARD or IMMUNOTHERAPY ALERT CARD  at check-in to the Emergency Department and triage nurse.  Should you have questions after your visit or need to cancel or reschedule your appointment, please contact [facility]  Dept: [phone]  and follow the prompts.  Office hours are 8:00 a.m. to 4:30 p.m. Monday - Friday. Please note that voicemails left after 4:00 p.m. may not be returned until the following business day.  We are closed weekends and major holidays. You have access to a nurse at all times for urgent questions. Please call the main number to the clinic Dept: [phone] and follow the prompts.   For any non-urgent questions, you may also contact your provider using MyChart. We now offer e-Visits for anyone 18 and older to request care online for non-urgent symptoms. For details visit [email or web address].   Also download the MyChart app! Go to the app store, search "MyChart", open the app, select , and log in with your MyChart username and password.  Due to Covid, a mask is required upon entering the hospital/clinic. If you do not have a mask, one will be given to you upon arrival. For doctor visits, patients may have 1 support person aged 18 or older with them. For treatment visits, patients cannot have anyone with them due to current Covid guidelines and our immunocompromised population.   

## 2021-04-23 ENCOUNTER — Telehealth: Payer: Self-pay | Admitting: Physician Assistant

## 2021-04-23 NOTE — Telephone Encounter (Signed)
Scheduled per los. Called and spoke with patient and confirmed change of appts dates and times.

## 2021-04-28 ENCOUNTER — Encounter: Payer: Self-pay | Admitting: Radiation Oncology

## 2021-04-30 ENCOUNTER — Ambulatory Visit
Admission: RE | Admit: 2021-04-30 | Discharge: 2021-04-30 | Disposition: A | Discharge status: Home / Self Care | Payer: 59 | Source: Ambulatory Visit | Attending: Radiation Oncology | Admitting: Radiation Oncology

## 2021-04-30 ENCOUNTER — Encounter: Payer: Self-pay | Admitting: Radiation Oncology

## 2021-04-30 ENCOUNTER — Other Ambulatory Visit: Payer: Self-pay

## 2021-04-30 DIAGNOSIS — C7931 Secondary malignant neoplasm of brain: Secondary | ICD-10-CM

## 2021-04-30 DIAGNOSIS — C3412 Malignant neoplasm of upper lobe, left bronchus or lung: Secondary | ICD-10-CM

## 2021-04-30 NOTE — Progress Notes (Signed)
Radiation Oncology         ([phone]) [phone] ________________________________  Outpatient Reconsultation - Conducted via telephone due to current COVID-19 concerns for limiting patient exposure  I spoke with the patient to conduct this consult visit via telephone to spare the patient unnecessary potential exposure in the healthcare setting during the current COVID-19 pandemic. The patient was notified in advance and was offered a [place] meeting to allow for face to face communication but unfortunately reported that they did not have the appropriate resources/technology to support such a visit and instead preferred to proceed with a telephone visit.  Name: Richard Santos        MRN: [id number]  Date of Service: 04/30/2021 DOB: May 31, 1961  NF:AOZH, Hunt Oris, MD  Curt Bears, MD     REFERRING PHYSICIAN: Curt Bears, MD   DIAGNOSIS: The primary encounter diagnosis was Metastasis to brain [facility]). Diagnoses of Small cell lung cancer, left upper lobe ([place]) and Metastatic cancer to brain [facility]) were also pertinent to this visit.   HISTORY OF PRESENT ILLNESS: Richard Santos is a 60 y.o. male  with a history of extensive stage small cell lung cancer.  The patient had a history of prostate cancer that was treated in 2016 with radioactive seed placement.  He was diagnosed with small cell carcinoma in May 2021 and received chemoradiation in the summer 2021, during his work-up he did have a 6 mm lesion in the brain along the cerebellum, however with repeat imaging on 08/03/2020, this was felt to be rather a subacute infarct and measured 4 mm.  He was counseled following the course of chemoradiation on prophylactic cranial irradiation and received this regimen in October 2021.  Of note he was unable to tolerate Namenda. He has been followed since without evidence of recurrent disease in the brain.  He also continues maintenance Imfinzi with Dr. Julien Nordmann, his last dose was on 04/22/2021.  He is due for his MRI of  the brain in a few weeks. He recently had restaging imaging on 04/18/2021 which showed unchanged lobulated disease in the left upper lobe and interval evolution and perihilar radiation fibrosis and cavitary lesion in the medial lingula with relatively thin wall and enlargement measuring up to 4.3 cm, enlargement of multiple left adrenal nodules were also identified consistent with metastatic disease, the largest is 2.8 cm, previously 1.3 cm in February 2022.  Of note since our last conversation he did pursue further work-up with Dr. Carlean Purl regarding his history of Schatzki ring and dysphagia.  The patient has still been able to eat most foods but does find that things have to be chewed and swallowed slowly, he is contacted by phone today to discuss the rationale for palliative radiotherapy to the left adrenal gland.   PREVIOUS RADIATION THERAPY: Yes   08/26/20-09/06/20 Prophylactic Cranial Irradiation (PCI)Treatment: The patient's whole brain was treated to 25 Gy in 10 fractions   04/25/20 - 05/15/20: The patient was treated to the disease within the leftlung initially to a dose of 37.5Gy in 15 fractionsusing a55field, 3-D conformal technique.   2016:  The prostate was treated with radioactive seeds with the following prescription: Prostate 14,500 cGy.Isotope: I-125 utilizing 79 seeds and 29 active needles. Individual seed activity 0.47 mCi per seed for a total implant activity of 37.13 mCi.  PAST MEDICAL HISTORY:  Past Medical History:  Diagnosis Date  . Allergic rhinitis   . Allergy   . Anxiety   . Chronic low back pain   .  DDD (degenerative disc disease), lumbar   . ED (erectile dysfunction)   . Finger injury    cut pads off 3rd and 4th finger left hand/due to lawn mower accident  . GERD (gastroesophageal reflux disease)   . Heart murmur    as child only-   . History of kidney stones   . HTN (hypertension) 08/20/2016  . Hyperlipidemia   . Incomplete right bundle branch block    . Prostate cancer ([place]) dx 10/02/14   stage T1c  . Sigmoid diverticulosis   . Small cell lung cancer ([place]) 03/2020  . Wears glasses        PAST SURGICAL HISTORY: Past Surgical History:  Procedure Laterality Date  . BRONCHIAL BIOPSY  04/12/2020   Procedure: BRONCHIAL BIOPSIES;  Surgeon: Marshell Garfinkel, MD;  Location: [place];  Service: Cardiopulmonary;;  . BRONCHIAL BRUSHINGS  04/12/2020   Procedure: BRONCHIAL BRUSHINGS;  Surgeon: Marshell Garfinkel, MD;  Location: [place];  Service: Cardiopulmonary;;  . BRONCHIAL NEEDLE ASPIRATION BIOPSY  04/12/2020   Procedure: BRONCHIAL NEEDLE ASPIRATION BIOPSIES;  Surgeon: Marshell Garfinkel, MD;  Location: [place];  Service: Cardiopulmonary;;  . BRONCHIAL WASHINGS  04/12/2020   Procedure: BRONCHIAL WASHINGS;  Surgeon: Marshell Garfinkel, MD;  Location: [facility];  Service: Cardiopulmonary;;  . COLONOSCOPY    . COLONOSCOPY W/ POLYPECTOMY  04-19-2012  . ENDOBRONCHIAL ULTRASOUND N/A 04/12/2020   Procedure: ENDOBRONCHIAL ULTRASOUND;  Surgeon: Marshell Garfinkel, MD;  Location: [place];  Service: Cardiopulmonary;  Laterality: N/A;  . ESOPHAGOGASTRODUODENOSCOPY  08-05-2010  . HEMOSTASIS CONTROL  04/12/2020   Procedure: HEMOSTASIS CONTROL;  Surgeon: Marshell Garfinkel, MD;  Location: [place];  Service: Cardiopulmonary;;  . IR IMAGING GUIDED PORT INSERTION  05/31/2020  . POLYPECTOMY    . PROSTATE BIOPSY  10/02/14  . RADIOACTIVE SEED IMPLANT N/A 01/30/2015   Procedure: RADIOACTIVE SEED IMPLANT;  Surgeon: Rana Snare, MD;  Location: [facility];  Service: Urology;  Laterality: N/A;  . UPPER GASTROINTESTINAL ENDOSCOPY  04/03/2021  . VIDEO BRONCHOSCOPY N/A 04/12/2020   Procedure: VIDEO BRONCHOSCOPY WITHOUT FLUORO;  Surgeon: Marshell Garfinkel, MD;  Location: [place] [facility];  Service: Cardiopulmonary;  Laterality: N/A;     FAMILY HISTORY:  Family History  Problem Relation Age of Onset  . Prostate cancer Father   . Heart disease Father    . Heart disease Mother   . Breast cancer Sister   . Heart disease Other   . Colon cancer Neg Hx   . Colon polyps Neg Hx   . Esophageal cancer Neg Hx   . Rectal cancer Neg Hx   . Stomach cancer Neg Hx      SOCIAL HISTORY:  reports that he has been smoking cigarettes. He has a 25.00 pack-year smoking history. He has never used smokeless tobacco. He reports current alcohol use of about 49.0 standard drinks of alcohol per week. He reports that he does not use drugs. The patient is married and lives in [place]. 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   ALLERGIES: Bee venom, Elemental sulfur, Sulfa antibiotics, and Namenda [memantine]   MEDICATIONS:  Current Outpatient Medications  Medication Sig Dispense Refill  . aspirin EC 81 MG tablet Take 81 mg by mouth daily. Swallow whole.    Marland Kitchen BREZTRI AEROSPHERE 160-9-4.8 MCG/ACT AERO INHALE 2 PUFFS INTO THE LUNGS IN THE MORNING AND AT BEDTIME. 10.7 g 10  . busPIRone (BUSPAR) 10 MG tablet TAKE 1 TABLET BY MOUTH TWICE A DAY 180 tablet 3  . meloxicam (MOBIC) 15  MG tablet Take 15 mg by mouth daily.    . Multiple Vitamin (MULTIVITAMIN) capsule Take 1 capsule by mouth daily.    . ondansetron (ZOFRAN-ODT) 8 MG disintegrating tablet Take 1 tablet (8 mg total) by mouth every 8 (eight) hours as needed for nausea or vomiting. 30 tablet 2  . pantoprazole (PROTONIX) 40 MG tablet TAKE 1 TABLET BY MOUTH DAILY BEFORE BREAKFAST 90 tablet 3  . traMADol (ULTRAM) 50 MG tablet TAKE HALF TABLET BY MOUTH 2 TIMES DAILY AS NEEDED FOR PAIN 60 tablet 2  . acetaminophen (TYLENOL) 325 MG tablet Take 2 tablets (650 mg total) by mouth every 6 (six) hours as needed for mild pain (or Fever >/= 101). (Patient not taking: Reported on 04/30/2021) 30 tablet 0  . lidocaine-prilocaine (EMLA) cream Apply 1 application topically as needed. (Patient not taking: Reported on 04/30/2021) 30 g 2  . prochlorperazine (COMPAZINE) 10 MG tablet Take 1 tablet (10 mg total) by mouth every 6  (six) hours as needed. (Patient not taking: Reported on 04/30/2021) 30 tablet 2  . Pseudoephedrine HCl (SUDAFED PO) Take 1 tablet by mouth daily. 2 week trial for ringing/congestion in ears (Patient not taking: Reported on 04/30/2021)    . triamcinolone cream (KENALOG) 0.5 % Apply 1 application topically 3 (three) times daily. (Patient not taking: Reported on 04/30/2021) 90 g 1   Current Facility-Administered Medications  Medication Dose Route Frequency Provider Last Rate Last Admin  . 0.9 %  sodium chloride infusion  500 mL Intravenous Once Gatha Mayer, MD         REVIEW OF SYSTEMS:   On review of systems the patient reports that overall he is doing fairly well.  He states that he continues to have dysphagia, and could not really tell significant improvement after his endoscopy.  Fortunately he was happy to know that there was not anything else causing his symptoms within the lumen of the esophagus.  He states that he is constantly managing the effects to his bowel from immunotherapy which teeter between looseness followed by constipation if he takes Imodium.  He denies any vomiting but does have constant nausea for which she takes scheduled Zofran.  No new headaches visual changes or other neurologic symptoms are noted.  He denies any abdominal pain.  No other complaints are verbalized.  PHYSICAL EXAM:  Wt Readings from Last 3 Encounters:  04/22/21 154 lb 9.6 oz (70.1 kg)  04/03/21 153 lb (69.4 kg)  03/19/21 152 lb 1.6 oz (69 kg)   Unable to assess due to encounter type.   ECOG = 0  0 - Asymptomatic (Fully active, able to carry on all predisease activities without restriction)  1 - Symptomatic but completely ambulatory (Restricted in physically strenuous activity but ambulatory and able to carry out work of a light or sedentary nature. For example, light housework, office work)  2 - Symptomatic, <50% in bed during the day (Ambulatory and capable of all self care but unable to carry out  any work activities. Up and about more than 50% of waking hours)  3 - Symptomatic, >50% in bed, but not bedbound (Capable of only limited self-care, confined to bed or chair 50% or more of waking hours)  4 - Bedbound (Completely disabled. Cannot carry on any self-care. Totally confined to bed or chair)  5 - Death   Eustace Pen MM, Creech RH, Tormey DC, et al. [phone]). "Toxicity and response criteria of the [facility]". [place] Oncol. 5 (6):  119-41    LABORATORY DATA:  Lab Results  Component Value Date   WBC 4.8 04/22/2021   HGB 11.2 (L) 04/22/2021   HCT 31.6 (L) 04/22/2021   MCV 100.6 (H) 04/22/2021   PLT 240 04/22/2021   Lab Results  Component Value Date   NA 136 04/22/2021   K 4.1 04/22/2021   CL 98 04/22/2021   CO2 27 04/22/2021   Lab Results  Component Value Date   ALT 37 04/22/2021   AST 49 (H) 04/22/2021   ALKPHOS 70 04/22/2021   BILITOT 0.4 04/22/2021      RADIOGRAPHY: CT Chest W Contrast  Result Date: 04/20/2021 CLINICAL DATA:  Small-cell lung cancer restaging, metastatic to brain, weight loss EXAM: CT CHEST, ABDOMEN, AND PELVIS WITH CONTRAST TECHNIQUE: Multidetector CT imaging of the chest, abdomen and pelvis was performed following the standard protocol during bolus administration of intravenous contrast. CONTRAST:  165mL OMNIPAQUE IOHEXOL 300 MG/ML SOLN, additional oral enteric contrast COMPARISON:  01/20/2021 FINDINGS: CT CHEST FINDINGS Cardiovascular: Scattered aortic atherosclerosis. Normal heart size. Three-vessel coronary artery calcification. No pericardial effusion. Mediastinum/Nodes: No enlarged mediastinal, hilar, or axillary lymph nodes. Thyroid gland, trachea, and esophagus demonstrate no significant findings. Lungs/Pleura: Unchanged lobulated nodule of the peripheral left upper lobe subtle internal cavitation, measuring 2.1 x 1.2 cm (series 4, image 42). Extensive perihilar radiation fibrosis, with interval increase in consolidation,  bronchiectasis, and volume loss. Redemonstrated cavitary lesion of the medial lingula, which is now relatively thin walled and enlarged, measuring 4.3 x 2.5 cm (series 4, image 63). No pleural effusion or pneumothorax. Musculoskeletal: No chest wall mass or suspicious bone lesions identified. CT ABDOMEN PELVIS FINDINGS Hepatobiliary: No solid liver abnormality is seen. No gallstones, gallbladder wall thickening, or biliary dilatation. Pancreas: Unremarkable. No pancreatic ductal dilatation or surrounding inflammatory changes. Spleen: Normal in size without significant abnormality. Adrenals/Urinary Tract: Interval enlargement of multiple left adrenal nodules, largest inferiorly lateral limb measuring 2.8 x 2.6 cm, previously 1.3 x 1.2 cm (series 2, image 67). Small nonobstructive left renal calculus. No ureteral calculi or hydronephrosis. Bladder is unremarkable. Stomach/Bowel: Stomach is within normal limits. Appendix appears normal. No evidence of bowel wall thickening, distention, or inflammatory changes. Sigmoid diverticulosis Vascular/Lymphatic: Aortic atherosclerosis. No enlarged abdominal or pelvic lymph nodes. Reproductive: Prostate brachytherapy pellets. Other: No abdominal wall hernia or abnormality. No abdominopelvic ascites. Musculoskeletal: No acute or significant osseous findings. IMPRESSION: 1. Unchanged lobulated nodule of the peripheral left upper lobe, consistent with treated primary lung malignancy. 2. Interval evolution of extensive perihilar radiation fibrosis of the left lung. 3. Redemonstrated cavitary lesion of the medial lingula, which is now relatively thin walled and enlarged, measuring 4.3 x 2.5 cm. 4. Interval enlargement of multiple left adrenal nodules, consistent with worsened metastatic disease. 5. No other evidence of metastatic disease in the chest, abdomen or pelvis. 6. Coronary artery disease. 7. Nonobstructive left nephrolithiasis. Aortic Atherosclerosis (ICD10-I70.0).  Electronically Signed   By: Eddie Candle M.D.   On: 04/20/2021 14:39   CT Abdomen Pelvis W Contrast  Result Date: 04/20/2021 CLINICAL DATA:  Small-cell lung cancer restaging, metastatic to brain, weight loss EXAM: CT CHEST, ABDOMEN, AND PELVIS WITH CONTRAST TECHNIQUE: Multidetector CT imaging of the chest, abdomen and pelvis was performed following the standard protocol during bolus administration of intravenous contrast. CONTRAST:  141mL OMNIPAQUE IOHEXOL 300 MG/ML SOLN, additional oral enteric contrast COMPARISON:  01/20/2021 FINDINGS: CT CHEST FINDINGS Cardiovascular: Scattered aortic atherosclerosis. Normal heart size. Three-vessel coronary artery calcification. No pericardial effusion. Mediastinum/Nodes: No enlarged mediastinal,  hilar, or axillary lymph nodes. Thyroid gland, trachea, and esophagus demonstrate no significant findings. Lungs/Pleura: Unchanged lobulated nodule of the peripheral left upper lobe subtle internal cavitation, measuring 2.1 x 1.2 cm (series 4, image 42). Extensive perihilar radiation fibrosis, with interval increase in consolidation, bronchiectasis, and volume loss. Redemonstrated cavitary lesion of the medial lingula, which is now relatively thin walled and enlarged, measuring 4.3 x 2.5 cm (series 4, image 63). No pleural effusion or pneumothorax. Musculoskeletal: No chest wall mass or suspicious bone lesions identified. CT ABDOMEN PELVIS FINDINGS Hepatobiliary: No solid liver abnormality is seen. No gallstones, gallbladder wall thickening, or biliary dilatation. Pancreas: Unremarkable. No pancreatic ductal dilatation or surrounding inflammatory changes. Spleen: Normal in size without significant abnormality. Adrenals/Urinary Tract: Interval enlargement of multiple left adrenal nodules, largest inferiorly lateral limb measuring 2.8 x 2.6 cm, previously 1.3 x 1.2 cm (series 2, image 67). Small nonobstructive left renal calculus. No ureteral calculi or hydronephrosis. Bladder is  unremarkable. Stomach/Bowel: Stomach is within normal limits. Appendix appears normal. No evidence of bowel wall thickening, distention, or inflammatory changes. Sigmoid diverticulosis Vascular/Lymphatic: Aortic atherosclerosis. No enlarged abdominal or pelvic lymph nodes. Reproductive: Prostate brachytherapy pellets. Other: No abdominal wall hernia or abnormality. No abdominopelvic ascites. Musculoskeletal: No acute or significant osseous findings. IMPRESSION: 1. Unchanged lobulated nodule of the peripheral left upper lobe, consistent with treated primary lung malignancy. 2. Interval evolution of extensive perihilar radiation fibrosis of the left lung. 3. Redemonstrated cavitary lesion of the medial lingula, which is now relatively thin walled and enlarged, measuring 4.3 x 2.5 cm. 4. Interval enlargement of multiple left adrenal nodules, consistent with worsened metastatic disease. 5. No other evidence of metastatic disease in the chest, abdomen or pelvis. 6. Coronary artery disease. 7. Nonobstructive left nephrolithiasis. Aortic Atherosclerosis (ICD10-I70.0). Electronically Signed   By: Eddie Candle M.D.   On: 04/20/2021 14:39       IMPRESSION/PLAN: 1. Extensive stage small cell lung cancer arising in the left lung.  Again the patient's disease burden has been to some degree difficult to quite classify because of the fact that the follow-up MRI called the area in the brain subacute infarct, we treated him as though this was not involved with PCI, and he did complete a course of systemic therapy more similar to limited stage disease.  He is doing well clinically and will have another MRI scan in approximately 2 weeks, I will follow-up with the results of his images and communicate with him the results at that time.  His case continues to be reviewed in multidisciplinary brain oncology conference.  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 Lisbeth Renshaw would offer a palliative course of radiotherapy.  We discussed the  risks, benefits, short and long-term effects of radiotherapy as well as delivery and logistics, and Dr. Lisbeth Renshaw recommends a 3-week course of radiotherapy to the left adrenal gland given his ongoing systemic therapy.  The patient will come in for simulation on 05/06/2021 when he already has an appointment with Dr. Gwynn Burly office.   2. Solid food dysphagia.  This will be followed expectantly, no other structural changes are appreciated extrinsically by CT after Dr. Lisbeth Renshaw personally reviewed his films.  Given current concerns for patient exposure during the COVID-19 pandemic, this encounter was conducted via telephone.  The patient has provided two factor identification and has given verbal consent for this type of encounter and has been advised to only accept a meeting of this type in a secure network environment. The time spent during this encounter was 45  minutes including preparation, discussion, and coordination of the patient's care. The attendants for this meeting include Jodelle Gross, MD, PhD Hayden Pedro  and [name].  During the encounter Jodelle Gross, MD, PhD and ,Hayden Pedro were located at [facility] Radiation Oncology Department.  GUERRY COVINGTON was located at home.     Carola Rhine, PAC

## 2021-04-30 NOTE — Progress Notes (Addendum)
Patients meaningful use is complete . Patient is aware that the nursing portion is done.Patient is aware that this is a phone visit.

## 2021-05-06 ENCOUNTER — Other Ambulatory Visit: Payer: Self-pay

## 2021-05-06 ENCOUNTER — Ambulatory Visit
Admission: RE | Admit: 2021-05-06 | Discharge: 2021-05-06 | Disposition: A | Discharge status: Home / Self Care | Payer: 59 | Source: Ambulatory Visit | Attending: Radiation Oncology | Admitting: Radiation Oncology

## 2021-05-06 ENCOUNTER — Ambulatory Visit (INDEPENDENT_AMBULATORY_CARE_PROVIDER_SITE_OTHER): Payer: 59 | Admitting: Internal Medicine

## 2021-05-06 ENCOUNTER — Encounter: Payer: Self-pay | Admitting: Internal Medicine

## 2021-05-06 VITALS — BP 108/70 | HR 84 | Temp 98.1°F | Ht 72.0 in | Wt 158.2 lb

## 2021-05-06 DIAGNOSIS — Z0001 Encounter for general adult medical examination with abnormal findings: Secondary | ICD-10-CM

## 2021-05-06 DIAGNOSIS — Z923 Personal history of irradiation: Secondary | ICD-10-CM | POA: Diagnosis not present

## 2021-05-06 DIAGNOSIS — R7302 Impaired glucose tolerance (oral): Secondary | ICD-10-CM | POA: Diagnosis not present

## 2021-05-06 DIAGNOSIS — E7800 Pure hypercholesterolemia, unspecified: Secondary | ICD-10-CM | POA: Diagnosis not present

## 2021-05-06 DIAGNOSIS — Z510 Encounter for antineoplastic radiation therapy: Secondary | ICD-10-CM | POA: Insufficient documentation

## 2021-05-06 DIAGNOSIS — C7972 Secondary malignant neoplasm of left adrenal gland: Secondary | ICD-10-CM | POA: Insufficient documentation

## 2021-05-06 DIAGNOSIS — C3412 Malignant neoplasm of upper lobe, left bronchus or lung: Secondary | ICD-10-CM

## 2021-05-06 DIAGNOSIS — K219 Gastro-esophageal reflux disease without esophagitis: Secondary | ICD-10-CM

## 2021-05-06 DIAGNOSIS — C7931 Secondary malignant neoplasm of brain: Secondary | ICD-10-CM | POA: Insufficient documentation

## 2021-05-06 DIAGNOSIS — E871 Hypo-osmolality and hyponatremia: Secondary | ICD-10-CM | POA: Diagnosis not present

## 2021-05-06 DIAGNOSIS — Z7982 Long term (current) use of aspirin: Secondary | ICD-10-CM | POA: Diagnosis not present

## 2021-05-06 DIAGNOSIS — Z79899 Other long term (current) drug therapy: Secondary | ICD-10-CM | POA: Diagnosis not present

## 2021-05-06 DIAGNOSIS — Z9221 Personal history of antineoplastic chemotherapy: Secondary | ICD-10-CM | POA: Diagnosis not present

## 2021-05-06 DIAGNOSIS — I10 Essential (primary) hypertension: Secondary | ICD-10-CM | POA: Diagnosis not present

## 2021-05-06 DIAGNOSIS — C61 Malignant neoplasm of prostate: Secondary | ICD-10-CM

## 2021-05-06 MED ORDER — PANTOPRAZOLE SODIUM 40 MG PO TBEC: 1.0000 | DELAYED_RELEASE_TABLET | Freq: Every day | ORAL | 3 refills | Status: DC

## 2021-05-06 NOTE — Assessment & Plan Note (Signed)
Age and sex appropriate education and counseling updated with regular exercise and diet Referrals for preventative services - none needed Immunizations addressed - declines shingirx, covid booster, tdap, pneumovax Smoking counseling  - counseled to quit, pt not ready Evidence for depression or other mood disorder - none significant Most recent labs reviewed. I have personally reviewed and have noted: 1) the patient's medical and social history 2) The patient's current medications and supplements 3) The patient's height, weight, and BMI have been recorded in the chart

## 2021-05-06 NOTE — Patient Instructions (Signed)
Please continue all other medications as before, and refills have been done if requested.  Please have the pharmacy call with any other refills you may need.  Please continue your efforts at being more active, low cholesterol diet, and weight control.  You are otherwise up to date with prevention measures today.  Please keep your appointments with your specialists as you may have planned  We can hold on lab testing today  Good Luck with your Radiation Therapy  Please make an Appointment to return for your 1 year visit, or sooner if needed

## 2021-05-06 NOTE — Assessment & Plan Note (Signed)
Continue f/u at 1 yr as planned

## 2021-05-06 NOTE — Assessment & Plan Note (Signed)
For adrenal met XRT soon, conts to follow oncology closely

## 2021-05-06 NOTE — Assessment & Plan Note (Addendum)
Lab Results  Component Value Date   LDLCALC 104 (H) 08/10/2018   Mild uncontolled, pt to continue current low chol diet, - declines statin

## 2021-05-06 NOTE — Progress Notes (Signed)
Patient ID: Richard Santos, male   DOB: [birth year], 60 y.o.   MRN: [id number]         Chief Complaint:: wellness exam and Follow-up  Gerd, htn, hyperglycemia, lung ca, prostate ca       HPI:  Richard Santos is a 60 y.o. 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  Still sees urology yearly for f/u prostate ca, last was April 2022.  Has had mild worsening reflux having run out of PPI for several wks but no other abd pain, dysphagia, n/v, bowel change or blood.  Has lost wt, no longer has need for BP tx.  Denies urinary symptoms such as dysuria, frequency, urgency, flank pain, hematuria or n/v, fever, chills. - has plan to f/u urology at 1 yr as now stable.  Pt denies chest pain, increased sob or doe, wheezing, orthopnea, PND, increased LE swelling, palpitations, dizziness or syncope.   Pt denies polydipsia, polyuria, or new focal neuro s/s.  No other new complaints  Wt Readings from Last 3 Encounters:  05/06/21 158 lb 3.2 oz (71.8 kg)  04/22/21 154 lb 9.6 oz (70.1 kg)  04/03/21 153 lb (69.4 kg)   BP Readings from Last 3 Encounters:  05/06/21 108/70  04/22/21 120/71  04/03/21 116/77   Immunization History  Administered Date(s) Administered   Moderna Sars-Covid-2 Vaccination 01/26/2020, 02/27/2020, 10/06/2020   Td 12/31/2010   There are no preventive care reminders to display for this patient.     Past Medical History:  Diagnosis Date   Allergic rhinitis    Allergy    Anxiety    Chronic low back pain    DDD (degenerative disc disease), lumbar    ED (erectile dysfunction)    Finger injury    cut pads off 3rd and 4th finger left hand/due to lawn mower accident   GERD (gastroesophageal reflux disease)    Heart murmur    as child only-    History of kidney stones     HTN (hypertension) 08/20/2016   Hyperlipidemia    Incomplete right bundle branch block    Prostate cancer ([place]) dx 10/02/14   stage T1c   Sigmoid diverticulosis    Small cell lung cancer ([place]) 03/2020   Wears glasses    Past Surgical History:  Procedure Laterality Date   BRONCHIAL BIOPSY  04/12/2020   Procedure: BRONCHIAL BIOPSIES;  Surgeon: Marshell Garfinkel, MD;  Location: [facility];  Service: Cardiopulmonary;;   BRONCHIAL BRUSHINGS  04/12/2020   Procedure: BRONCHIAL BRUSHINGS;  Surgeon: Marshell Garfinkel, MD;  Location: [facility];  Service: Cardiopulmonary;;   BRONCHIAL NEEDLE ASPIRATION BIOPSY  04/12/2020   Procedure: BRONCHIAL NEEDLE ASPIRATION BIOPSIES;  Surgeon: Marshell Garfinkel, MD;  Location: [facility];  Service: Cardiopulmonary;;   BRONCHIAL WASHINGS  04/12/2020   Procedure: BRONCHIAL WASHINGS;  Surgeon: Marshell Garfinkel, MD;  Location: [facility];  Service: Cardiopulmonary;;   COLONOSCOPY     COLONOSCOPY W/ POLYPECTOMY  04-19-2012   ENDOBRONCHIAL ULTRASOUND N/A 04/12/2020   Procedure: ENDOBRONCHIAL ULTRASOUND;  Surgeon: Marshell Garfinkel, MD;  Location: [facility];  Service: Cardiopulmonary;  Laterality: N/A;   ESOPHAGOGASTRODUODENOSCOPY  08-05-2010   HEMOSTASIS CONTROL  04/12/2020   Procedure: HEMOSTASIS  CONTROL;  Surgeon: Marshell Garfinkel, MD;  Location: [facility];  Service: Cardiopulmonary;;   IR IMAGING GUIDED PORT INSERTION  05/31/2020   POLYPECTOMY     PROSTATE BIOPSY  10/02/14   RADIOACTIVE SEED IMPLANT N/A 01/30/2015   Procedure: RADIOACTIVE SEED IMPLANT;  Surgeon: Rana Snare, MD;  Location[facility];  Service: Urology;  Laterality: N/A;   UPPER GASTROINTESTINAL ENDOSCOPY  04/03/2021   VIDEO BRONCHOSCOPY N/A 04/12/2020   Procedure: VIDEO BRONCHOSCOPY WITHOUT FLUORO;  Surgeon: Marshell Garfinkel, MD;  Location: [place];  Service: Cardiopulmonary;  Laterality: N/A;    reports that he has been smoking cigarettes. He has a 25.00 pack-year smoking  history. He has never used smokeless tobacco. He reports current alcohol use of about 49.0 standard drinks of alcohol per week. He reports that he does not use drugs. family history includes Breast cancer in his sister; Heart disease in his father, mother, and another family member; Prostate cancer in his father. Allergies  Allergen Reactions   Bee Venom Swelling   Elemental Sulfur Other (See Comments)    Acute renal failure   Sulfa Antibiotics    Namenda [Memantine] Nausea Only    Nausea and fatigue   Current Outpatient Medications on File Prior to Visit  Medication Sig Dispense Refill   acetaminophen (TYLENOL) 325 MG tablet Take 2 tablets (650 mg total) by mouth every 6 (six) hours as needed for mild pain (or Fever >/= 101). 30 tablet 0   aspirin EC 81 MG tablet Take 81 mg by mouth daily. Swallow whole.     BREZTRI AEROSPHERE 160-9-4.8 MCG/ACT AERO INHALE 2 PUFFS INTO THE LUNGS IN THE MORNING AND AT BEDTIME. 10.7 g 10   busPIRone (BUSPAR) 10 MG tablet TAKE 1 TABLET BY MOUTH TWICE A DAY 180 tablet 3   lidocaine-prilocaine (EMLA) cream Apply 1 application topically as needed. 30 g 2   meloxicam (MOBIC) 15 MG tablet Take 15 mg by mouth daily.     Multiple Vitamin (MULTIVITAMIN) capsule Take 1 capsule by mouth daily.     ondansetron (ZOFRAN-ODT) 8 MG disintegrating tablet Take 1 tablet (8 mg total) by mouth every 8 (eight) hours as needed for nausea or vomiting. 30 tablet 2   prochlorperazine (COMPAZINE) 10 MG tablet Take 1 tablet (10 mg total) by mouth every 6 (six) hours as needed. 30 tablet 2   Pseudoephedrine HCl (SUDAFED PO) Take 1 tablet by mouth daily. 2 week trial for ringing/congestion in ears     traMADol (ULTRAM) 50 MG tablet TAKE HALF TABLET BY MOUTH 2 TIMES DAILY AS NEEDED FOR PAIN 60 tablet 2   triamcinolone cream (KENALOG) 0.5 % Apply 1 application topically 3 (three) times daily. 90 g 1   No current facility-administered medications on file prior to visit.        ROS:  All  others reviewed and negative.  Objective        PE:  BP 108/70 (BP Location: Left Arm, Patient Position: Sitting, Cuff Size: Normal)   Pulse 84   Temp 98.1 F (36.7 C) (Oral)   Ht 6' (1.829 m)   Wt 158 lb 3.2 oz (71.8 kg)   SpO2 99%   BMI 21.46 kg/m                 Constitutional: Pt appears in NAD               HENT: Head: NCAT.  Right Ear: External ear normal.                 Left Ear: External ear normal.                Eyes: . Pupils are equal, round, and reactive to light. Conjunctivae and EOM are normal               Nose: without d/c or deformity               Neck: Neck supple. Gross normal ROM               Cardiovascular: Normal rate and regular rhythm.                 Pulmonary/Chest: Effort normal and breath sounds without rales or wheezing.                Abd:  Soft, NT, ND, + BS, no organomegaly               Neurological: Pt is alert. At baseline orientation, motor grossly intact               Skin: Skin is warm. No rashes, no other new lesions, LE edema - none               Psychiatric: Pt behavior is normal without agitation   Micro: none  Cardiac tracings I have personally interpreted today:  none  Pertinent Radiological findings (summarize): none   Lab Results  Component Value Date   WBC 4.8 04/22/2021   HGB 11.2 (L) 04/22/2021   HCT 31.6 (L) 04/22/2021   PLT 240 04/22/2021   GLUCOSE 106 (H) 04/22/2021   CHOL 198 12/12/2019   TRIG 301.0 (H) 12/12/2019   HDL 46.60 12/12/2019   LDLDIRECT 120.0 12/12/2019   LDLCALC 104 (H) 08/10/2018   ALT 37 04/22/2021   AST 49 (H) 04/22/2021   NA 136 04/22/2021   K 4.1 04/22/2021   CL 98 04/22/2021   CREATININE 0.75 04/22/2021   BUN 7 04/22/2021   CO2 27 04/22/2021   TSH 0.234 (L) 02/19/2021   PSA 0.27 08/10/2018   INR 1.0 04/11/2020   HGBA1C 5.4 12/12/2019   Assessment/Plan:  Richard Santos is a 60 y.o. White or Caucasian [1] male with  has a past medical history of Allergic rhinitis,  Allergy, Anxiety, Chronic low back pain, DDD (degenerative disc disease), lumbar, ED (erectile dysfunction), Finger injury, GERD (gastroesophageal reflux disease), Heart murmur, History of kidney stones, HTN (hypertension) (08/20/2016), Hyperlipidemia, Incomplete right bundle branch block, Prostate cancer ([place]) (dx 10/02/14), Sigmoid diverticulosis, Small cell lung cancer ([place]) (03/2020), and Wears glasses.  Encounter for well adult exam with abnormal findings Age and sex appropriate education and counseling updated with regular exercise and diet Referrals for preventative services - none needed Immunizations addressed - declines shingirx, covid booster, tdap, pneumovax Smoking counseling  - counseled to quit, pt not ready Evidence for depression or other mood disorder - none significant Most recent labs reviewed. I have personally reviewed and have noted: 1) the patient's medical and social history 2) The patient's current medications and supplements 3) The patient's height, weight, and BMI have been recorded in the chart   Hyperlipidemia Lab Results  Component Value Date   LDLCALC 104 (H) 08/10/2018   Mild uncontolled, pt to continue current low chol diet, - declines statin   GERD With Mild distal esophagitis on EGD with biopsies 2011, for restart  protonix 04 qd  Impaired glucose tolerance Lab Results  Component Value Date   HGBA1C 5.4 12/12/2019   Stable, pt to continue current medical treatment  - diet   HTN (hypertension) With wt loss, no longer requires BP med control,  to f/u any worsening symptoms or concerns BP Readings from Last 3 Encounters:  05/06/21 108/70  04/22/21 120/71  04/03/21 116/77    Malignant neoplasm of prostate Continue f/u at 1 yr as planned  Small cell lung cancer, left upper lobe ([place]) For adrenal met XRT soon, conts to follow oncology closely  Followup: Return in about 1 year (around 05/06/2022).  Cathlean Cower, MD 05/06/2021 9:27 PM [facility]

## 2021-05-06 NOTE — Assessment & Plan Note (Signed)
Lab Results  Component Value Date   HGBA1C 5.4 12/12/2019   Stable, pt to continue current medical treatment  - diet

## 2021-05-06 NOTE — Assessment & Plan Note (Signed)
With Mild distal esophagitis on EGD with biopsies 2011, for restart protonix 04 qd

## 2021-05-06 NOTE — Assessment & Plan Note (Signed)
With wt loss, no longer requires BP med control,  to f/u any worsening symptoms or concerns BP Readings from Last 3 Encounters:  05/06/21 108/70  04/22/21 120/71  04/03/21 116/77

## 2021-05-07 ENCOUNTER — Encounter: Payer: Self-pay | Admitting: Internal Medicine

## 2021-05-08 ENCOUNTER — Encounter: Payer: Self-pay | Admitting: Internal Medicine

## 2021-05-09 MED ORDER — FAMOTIDINE 20 MG PO TABS: 20.0000 mg | ORAL_TABLET | Freq: Two times a day (BID) | ORAL | 2 refills | Status: DC

## 2021-05-14 ENCOUNTER — Other Ambulatory Visit: Payer: 59

## 2021-05-14 ENCOUNTER — Ambulatory Visit: Payer: 59

## 2021-05-14 ENCOUNTER — Ambulatory Visit: Payer: 59 | Admitting: Internal Medicine

## 2021-05-14 DIAGNOSIS — Z510 Encounter for antineoplastic radiation therapy: Secondary | ICD-10-CM | POA: Diagnosis not present

## 2021-05-15 ENCOUNTER — Other Ambulatory Visit: Payer: Self-pay

## 2021-05-15 ENCOUNTER — Ambulatory Visit (HOSPITAL_COMMUNITY)
Admission: RE | Admit: 2021-05-15 | Discharge: 2021-05-15 | Disposition: A | Discharge status: Home / Self Care | Payer: 59 | Source: Ambulatory Visit | Attending: Radiation Oncology | Admitting: Radiation Oncology

## 2021-05-15 ENCOUNTER — Encounter: Payer: Self-pay | Admitting: Radiation Oncology

## 2021-05-15 ENCOUNTER — Ambulatory Visit
Admission: RE | Admit: 2021-05-15 | Discharge: 2021-05-15 | Disposition: A | Discharge status: Home / Self Care | Payer: 59 | Source: Ambulatory Visit | Attending: Radiation Oncology | Admitting: Radiation Oncology

## 2021-05-15 DIAGNOSIS — Z510 Encounter for antineoplastic radiation therapy: Secondary | ICD-10-CM | POA: Diagnosis not present

## 2021-05-15 DIAGNOSIS — C7931 Secondary malignant neoplasm of brain: Secondary | ICD-10-CM | POA: Insufficient documentation

## 2021-05-15 MED ORDER — GADOBUTROL 1 MMOL/ML IV SOLN
7.0000 mL | Freq: Once | INTRAVENOUS | Status: AC | PRN
  Administered 2021-05-15: 7 mL via INTRAVENOUS

## 2021-05-15 NOTE — Progress Notes (Signed)
[place] OFFICE PROGRESS NOTE  [name], [place] [id number]  DIAGNOSIS: Extensive stage (T2b, N2, M1c) small cell lung cancer presented with left upper lobe pulmonary nodule in addition to left hilar mass with mediastinal invasion and solitary brain metastasis diagnosed in May 2021  PRIOR THERAPY:  1) Palliative radiation to the left hilar and mediastinal lymphadenopathy under the care of Dr. Lisbeth Renshaw. 2) Cranial irradiation from 08/26/20-09/06/20 under the care of Dr. Lisbeth Renshaw  CURRENT THERAPY: 1)  Palliative systemic chemotherapy with carboplatin for AUC of 5 on day 1, etoposide 100 mg/M2 on days 1, 2 and 3 as well as Imfinzi 1500 mg IV every 3 weeks with the chemotherapy.  First dose of chemotherapy May 06, 2020.  The patient will receive treatment with Cosela on the days of his chemotherapy.  Status post 13 cycles.  Starting from cycle #5 he started maintenance immunotherapy with Imfinzi 1500 mg IV every 4 weeks.   2) Radiation to the enlarging left adrenal nodules under the care of Dr. Lisbeth Renshaw. Last treatment expected on 06/05/21    INTERVAL HISTORY: Richard Santos 60 y.o. male returns to the clinic today for a follow-up visit accompanied by his wife. The patient was last seen in the clinic on 04/22/21.  At that time, the patient had a restaging CT scan performed which showed interval enlargement of multiple left adrenal nodules as well as an enlarging cavitary lesion in the lingula.  The patient was referred to radiation oncology for consideration of palliative radiation to the enlarging left adrenal nodules.  The patient is currently undergoing radiation and his last treatment is expected on 06/05/2021.  Dr. Julien Nordmann also recommended a close interval follow-up with his next restaging CT scan in 2 months which I would need to order today if he was going to proceed with infusion today as scheduled. .   He has been more forgetful recently. He states this has  been occurring for a few weeks. He seemed more forgetful and losing his train of thought while obtaining the history today. He states he has been forgetting what he goes to a room to do. Denies seizures. Denies new medications. He states he has been drinking a lot of fluid as well as powerade which has sodium in it per reviewing the bottle. He also has been eating biscuits.   He also recently was found to have 5 new brain metastases and was expected to have simulation for this later this week with radiation oncology. He is reporting some trouble sleeping due to stress with this new finding.     He denies any fever, chills, night sweats, or weight loss. He has been following with gastroenterology regarding dysphagia due to a nonobstructing Schatzki ring which was dilated.  He denies any chest pain, cough, or hemoptysis. He reports stable dyspnea on exertion. He has some periods of nausea for which he takes his anti-emetic with good control of his nausea.  He denies any rashes or skin changes.  The patient is here today for evaluation before starting cycle #14.   MEDICAL HISTORY: Past Medical History:  Diagnosis Date   Allergic rhinitis    Allergy    Anxiety    Chronic low back pain    DDD (degenerative disc disease), lumbar    ED (erectile dysfunction)    Finger injury    cut pads off 3rd and 4th finger left hand/due to lawn mower accident   GERD (gastroesophageal reflux  disease)    Heart murmur    as child only-    History of kidney stones    HTN (hypertension) 08/20/2016   Hyperlipidemia    Incomplete right bundle branch block    Prostate cancer ([place]) dx 10/02/14   stage T1c   Sigmoid diverticulosis    Small cell lung cancer ([facility]) 03/2020   Wears glasses     ALLERGIES:  is allergic to bee venom, elemental sulfur, sulfa antibiotics, and namenda [memantine].  MEDICATIONS:  Current Outpatient Medications  Medication Sig Dispense Refill   acetaminophen (TYLENOL) 325 MG tablet Take 2  tablets (650 mg total) by mouth every 6 (six) hours as needed for mild pain (or Fever >/= 101). 30 tablet 0   aspirin EC 81 MG tablet Take 81 mg by mouth daily. Swallow whole.     BREZTRI AEROSPHERE 160-9-4.8 MCG/ACT AERO INHALE 2 PUFFS INTO THE LUNGS IN THE MORNING AND AT BEDTIME. 10.7 g 10   busPIRone (BUSPAR) 10 MG tablet TAKE 1 TABLET BY MOUTH TWICE A DAY 180 tablet 3   famotidine (PEPCID) 20 MG tablet Take 1 tablet (20 mg total) by mouth 2 (two) times daily. (Patient not taking: Reported on 05/15/2021) 60 tablet 2   lidocaine-prilocaine (EMLA) cream Apply 1 application topically as needed. 30 g 2   meloxicam (MOBIC) 15 MG tablet Take 15 mg by mouth daily.     Multiple Vitamin (MULTIVITAMIN) capsule Take 1 capsule by mouth daily.     ondansetron (ZOFRAN-ODT) 8 MG disintegrating tablet Take 1 tablet (8 mg total) by mouth every 8 (eight) hours as needed for nausea or vomiting. 30 tablet 2   pantoprazole (PROTONIX) 40 MG tablet Take 1 tablet (40 mg total) by mouth daily before breakfast. 90 tablet 3   prochlorperazine (COMPAZINE) 10 MG tablet Take 1 tablet (10 mg total) by mouth every 6 (six) hours as needed. 30 tablet 2   Pseudoephedrine HCl (SUDAFED PO) Take 1 tablet by mouth daily. 2 week trial for ringing/congestion in ears (Patient not taking: Reported on 05/15/2021)     traMADol (ULTRAM) 50 MG tablet TAKE 1/2 TABLET BY MOUTH TWICE A DAY AS NEEDED FOR PAIN 60 tablet 2   triamcinolone cream (KENALOG) 0.5 % Apply 1 application topically 3 (three) times daily. (Patient not taking: Reported on 05/15/2021) 90 g 1   Current Facility-Administered Medications  Medication Dose Route Frequency Provider Last Rate Last Admin   0.9 %  sodium chloride infusion   Intravenous Once Reuel Lamadrid L, PA-C        SURGICAL HISTORY:  Past Surgical History:  Procedure Laterality Date   BRONCHIAL BIOPSY  04/12/2020   Procedure: BRONCHIAL BIOPSIES;  Surgeon: Marshell Garfinkel, MD;  Location: [facility];   Service: Cardiopulmonary;;   BRONCHIAL BRUSHINGS  04/12/2020   Procedure: BRONCHIAL BRUSHINGS;  Surgeon: Marshell Garfinkel, MD;  Location: [facility];  Service: Cardiopulmonary;;   BRONCHIAL NEEDLE ASPIRATION BIOPSY  04/12/2020   Procedure: BRONCHIAL NEEDLE ASPIRATION BIOPSIES;  Surgeon: Marshell Garfinkel, MD;  Location: [facility];  Service: Cardiopulmonary;;   BRONCHIAL WASHINGS  04/12/2020   Procedure: BRONCHIAL WASHINGS;  Surgeon: Marshell Garfinkel, MD;  Location: [facility];  Service: Cardiopulmonary;;   COLONOSCOPY     COLONOSCOPY W/ POLYPECTOMY  04-19-2012   ENDOBRONCHIAL ULTRASOUND N/A 04/12/2020   Procedure: ENDOBRONCHIAL ULTRASOUND;  Surgeon: Marshell Garfinkel, MD;  Location: [facility];  Service: Cardiopulmonary;  Laterality: N/A;   ESOPHAGOGASTRODUODENOSCOPY  08-05-2010   HEMOSTASIS CONTROL  04/12/2020   Procedure: HEMOSTASIS CONTROL;  Surgeon: Marshell Garfinkel, MD;  Location: [facility];  Service: Cardiopulmonary;;   IR IMAGING GUIDED PORT INSERTION  05/31/2020   POLYPECTOMY     PROSTATE BIOPSY  10/02/14   RADIOACTIVE SEED IMPLANT N/A 01/30/2015   Procedure: RADIOACTIVE SEED IMPLANT;  Surgeon: Rana Snare, MD;  Location: [facility];  Service: Urology;  Laterality: N/A;   UPPER GASTROINTESTINAL ENDOSCOPY  04/03/2021   VIDEO BRONCHOSCOPY N/A 04/12/2020   Procedure: VIDEO BRONCHOSCOPY WITHOUT FLUORO;  Surgeon: Marshell Garfinkel, MD;  Location: [facility];  Service: Cardiopulmonary;  Laterality: N/A;    REVIEW OF SYSTEMS:   Review of Systems  Constitutional: Negative for appetite change, chills, fatigue, fever and unexpected weight change.  HENT: Negative for mouth sores, nosebleeds, sore throat and trouble swallowing.   Eyes: Negative for eye problems and icterus.  Respiratory: Positive for baseline dyspnea on exertion. Negative for cough, hemoptysis, and wheezing.   Cardiovascular: Negative for chest pain and leg swelling.  Gastrointestinal: Negative for abdominal  pain, constipation, diarrhea, nausea and vomiting.  Genitourinary: Negative for bladder incontinence, difficulty urinating, dysuria, frequency and hematuria.   Musculoskeletal: Negative for back pain, gait problem, neck pain and neck stiffness.  Skin: Negative for itching and rash.  Neurological: Negative for dizziness, extremity weakness, gait problem, headaches, light-headedness and seizures.  Hematological: Negative for adenopathy. Does not bruise/bleed easily.  Psychiatric/Behavioral: Positive for confusion. Negative for depression and sleep disturbance. The patient is not nervous/anxious.     PHYSICAL EXAMINATION:  Blood pressure 122/73, pulse 81, temperature 97.8 F (36.6 C), temperature source Oral, resp. rate 17, weight 155 lb 11.2 oz (70.6 kg), SpO2 100 %.  ECOG PERFORMANCE STATUS: 1  Physical Exam  Constitutional: Oriented to person, place, and time and thin appearing male and in no distress.  HENT:  Head: Normocephalic and atraumatic.  Mouth/Throat: Oropharynx is clear and moist. No oropharyngeal exudate.  Eyes: Conjunctivae are normal. Right eye exhibits no discharge. Left eye exhibits no discharge. No scleral icterus.  Neck: Normal range of motion. Neck supple.  Cardiovascular: Normal rate, regular rhythm, normal heart sounds and intact distal pulses.   Pulmonary/Chest: Effort normal and breath sounds normal. No respiratory distress. No wheezes. No rales.  Abdominal: Soft. Bowel sounds are normal. Exhibits no distension and no mass. There is no tenderness.  Musculoskeletal: Normal range of motion. Exhibits no edema.  Lymphadenopathy:    No cervical adenopathy.  Neurological: Alert and oriented to person, place, and time. Exhibits normal muscle tone. Gait normal. Coordination normal.  Skin: Skin is warm and dry. No rash noted. Not diaphoretic. No erythema. No pallor.  Psychiatric: Mood, memory and judgment normal.  Vitals reviewed.  LABORATORY DATA: Lab Results   Component Value Date   WBC 7.0 05/20/2021   HGB 11.9 (L) 05/20/2021   HCT 32.3 (L) 05/20/2021   MCV 95.8 05/20/2021   PLT 242 05/20/2021      Chemistry      Component Value Date/Time   NA 118 (LL) 05/20/2021 1139   K 4.5 05/20/2021 1139   CL 84 (L) 05/20/2021 1139   CO2 24 05/20/2021 1139   BUN 9 05/20/2021 1139   CREATININE 0.70 05/20/2021 1139      Component Value Date/Time   CALCIUM 9.8 05/20/2021 1139   ALKPHOS 88 05/20/2021 1139   AST 56 (H) 05/20/2021 1139   ALT 40 05/20/2021 1139   BILITOT 0.6 05/20/2021 1139       RADIOGRAPHIC STUDIES:  MR Brain W Wo Contrast  Result Date:  05/16/2021 CLINICAL DATA:  Metastatic lung cancer post radiation. EXAM: MRI HEAD WITHOUT AND WITH CONTRAST TECHNIQUE: Multiplanar, multiecho pulse sequences of the brain and surrounding structures were obtained without and with intravenous contrast. CONTRAST:  78mL GADAVIST GADOBUTROL 1 MMOL/ML IV SOLN COMPARISON:  MRI head 02/13/2021 FINDINGS: Brain: New lesions: 9 mm necrotic lesion in the left temporoparietal lobe with surrounding edema. Axial image 134 4 mm necrotic lesion right temporal lobe with minimal edema. Axial image 172 5 mm enhancing lesion right lateral basal ganglia/external capsule. Axial image 190 3.5 mm lesion left frontal lobe axial image   195 3 mm lesion left head of caudate axial image 200. This image shows restricted diffusion. Stable lesions: Treated lesion left cerebellum without recurrence. Small amount of associated susceptibility. Ventricle size normal. Vascular: Normal arterial flow voids. Small developmental venous anomaly right medial parietal lobe unchanged. Skull and upper cervical spine: No focal skeletal metastasis identified. Sinuses/Orbits: Mild mucosal edema paranasal sinuses. Bilateral mastoid effusion similar to the prior MRI. Negative orbit Other: None IMPRESSION: Five new enhancing lesions compatible with metastatic disease to the brain Treated lesion left inferior  cerebellum stable Electronically Signed   By: Franchot Gallo M.D.   On: 05/16/2021 12:47     ASSESSMENT/PLAN:  This is a very pleasant 60 year old Caucasian male diagnosed with extensive stage small cell lung cancer.  He presented with a left upper lobe lung mass in addition to left hilar mediastinal lymphadenopathy.  He also had metastatic disease to the brain.  He was diagnosed in May 2021.   He underwent palliative radiotherapy to the left hilar and mediastinal lymphadenopathy in addition to radiotherapy to the brain in October 2021.   He is currently undergoing systemic chemotherapy with carboplatin for an AUC of 5 on day 1, etoposide 100 mg per metered squared on days 1, 2, and 3 with cosela infusion before chemotherapy.  He is status post 13 cycles of treatment.  Starting from cycle #5, the patient has been on maintenance immunotherapy IV every 4 weeks.   He is tolerating this well.   The patient is currently undergoing palliative radiation to the enlarging left adrenal nodules under the care of Dr. Lisbeth Renshaw.  His last treatment is expected on 06/05/2021.  The patient was seen with Dr. Julien Nordmann today.  Labs were reviewed.  His sodium is critically low at 118 today. The patient does seem to be more confused and forgetful today. No new medications. 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   We will send the patient to the ER for management and further evaluation of his critical hyponatremia. We would typically recommend a restaging CT scan of the chest, abdomen, and pelvis to assess for worsening small cell lung cancer at this time. Consider arranging while the patient is admitted.   We will see the patient back once discharged from the hospital to discuss restaging imaging findings. If his scan shows disease progression, we may need to discuss changing therapy to other chemotherapy regimens.   Until the patient can be seen in  the ER, Dr. Julien Nordmann would like the patient to receive IVF.   The patient was advised to call immediately if he has any concerning symptoms in the interval. The patient voices understanding of current disease status and treatment options and is in agreement with the current care plan. All questions were answered. The patient knows to call the clinic with any problems, questions or concerns. We can  certainly see the patient much sooner if necessary  No orders of the defined types were placed in this encounter.    Whitnie Deleon L Jameria Bradway, PA-C 05/20/21  ADDENDUM: Hematology/Oncology Attending: I had a face-to-face encounter with the patient today.  I reviewed his record, lab and recommended his care plan.  This is a very pleasant 60 years old white male with extensive stage small cell lung cancer diagnosed in May 2021 status post palliative radiotherapy to the left hilar and mediastinal lymphadenopathy as well as whole brain irradiation in October 2021.  The patient started systemic chemotherapy with carboplatin, etoposide, Cosela as well as Imfinzi initially for 4 cycles followed by maintenance treatment with Imfinzi every 4 weeks he status post 13 total cycle of his treatment.  He has been tolerating his treatment well.  He recently underwent palliative radiotherapy to the enlarging left adrenal gland nodule that showed evidence for disease progression.  He was also found on recent MRI of the brain to have new brain metastasis. The patient presented to the clinic today for evaluation before starting cycle #14 of his treatment and he was complaining of increasing fatigue and weakness. Comprehensive metabolic panel performed in the clinic today showed significant hyponatremia with sodium of 118. I strongly recommend for the patient to go immediately to the emergency department for evaluation and probably admission for management of his hyponatremia to avoid any further complication of his  condition. The patient may need repeat imaging studies during his hospitalization to rule out any worsening of the small cell lung cancer in the interval. If the patient has evidence for disease progression systemically, we will discuss with him other treatment options or consideration of palliative care and hospice. The patient and his wife are in agreement with the current plan. He will come back for follow-up visit after discharge from the hospital. The total time spent in the appointment was 35 minutes. Disclaimer: This note was dictated with voice recognition software. Similar sounding words can inadvertently be transcribed and may be missed upon review. Eilleen Kempf, MD 05/20/21

## 2021-05-15 NOTE — Progress Notes (Signed)
Patients meaningful use is complete and patient is aware that this is a phone visit.

## 2021-05-16 ENCOUNTER — Ambulatory Visit
Admission: RE | Admit: 2021-05-16 | Discharge: 2021-05-16 | Disposition: A | Discharge status: Home / Self Care | Payer: 59 | Source: Ambulatory Visit | Attending: Radiation Oncology | Admitting: Radiation Oncology

## 2021-05-16 DIAGNOSIS — Z510 Encounter for antineoplastic radiation therapy: Secondary | ICD-10-CM | POA: Diagnosis not present

## 2021-05-16 DIAGNOSIS — Z5112 Encounter for antineoplastic immunotherapy: Secondary | ICD-10-CM

## 2021-05-16 DIAGNOSIS — C3412 Malignant neoplasm of upper lobe, left bronchus or lung: Secondary | ICD-10-CM

## 2021-05-16 MED ORDER — SONAFINE EX EMUL
1.0000 | Freq: Once | CUTANEOUS | Status: AC
Start: 2021-05-16 — End: 2021-05-16
  Administered 2021-05-16: 1 via TOPICAL

## 2021-05-16 NOTE — Progress Notes (Signed)
Pt here for patient teaching.    Pt given Radiation and You booklet, skin care instructions, and Sonafine.    Reviewed areas of pertinence such as diarrhea, fatigue, hair loss, nausea and vomiting, skin changes, and urinary and bladder changes .   Pt able to give teach back of to pat skin, use unscented/gentle soap, and have Imodium on hand,apply Sonafine bid and avoid applying anything to skin within 4 hours of treatment.   Pt verbalizes understanding of information given and will contact nursing with any questions or concerns.    [email or web address]

## 2021-05-19 ENCOUNTER — Ambulatory Visit
Admission: RE | Admit: 2021-05-19 | Discharge: 2021-05-19 | Disposition: A | Discharge status: Home / Self Care | Payer: 59 | Source: Ambulatory Visit | Attending: Radiation Oncology | Admitting: Radiation Oncology

## 2021-05-19 ENCOUNTER — Other Ambulatory Visit: Payer: Self-pay | Admitting: Internal Medicine

## 2021-05-19 ENCOUNTER — Other Ambulatory Visit: Payer: Self-pay

## 2021-05-19 DIAGNOSIS — C7931 Secondary malignant neoplasm of brain: Secondary | ICD-10-CM

## 2021-05-19 DIAGNOSIS — E222 Syndrome of inappropriate secretion of antidiuretic hormone: Secondary | ICD-10-CM | POA: Diagnosis not present

## 2021-05-19 DIAGNOSIS — E871 Hypo-osmolality and hyponatremia: Secondary | ICD-10-CM | POA: Diagnosis not present

## 2021-05-19 DIAGNOSIS — C3412 Malignant neoplasm of upper lobe, left bronchus or lung: Secondary | ICD-10-CM

## 2021-05-19 NOTE — Telephone Encounter (Signed)
Tramadol  Last VIsit: 05/06/21 Next Visit: 05/07/22 Last Filled: 03/22/21

## 2021-05-20 ENCOUNTER — Ambulatory Visit
Admission: RE | Admit: 2021-05-20 | Discharge: 2021-05-20 | Disposition: A | Discharge status: Home / Self Care | Payer: 59 | Source: Ambulatory Visit | Attending: Radiation Oncology | Admitting: Radiation Oncology

## 2021-05-20 ENCOUNTER — Inpatient Hospital Stay: Payer: 59

## 2021-05-20 ENCOUNTER — Ambulatory Visit: Payer: 59

## 2021-05-20 ENCOUNTER — Encounter (HOSPITAL_COMMUNITY): Payer: Self-pay

## 2021-05-20 ENCOUNTER — Inpatient Hospital Stay (HOSPITAL_COMMUNITY)
Admission: EM | Admit: 2021-05-20 | Discharge: 2021-05-23 | DRG: 644 | Disposition: A | Discharge status: Home / Self Care | Payer: 59 | Attending: Family Medicine | Admitting: Family Medicine

## 2021-05-20 ENCOUNTER — Other Ambulatory Visit: Payer: Self-pay

## 2021-05-20 ENCOUNTER — Inpatient Hospital Stay: Payer: 59 | Attending: Internal Medicine | Admitting: Physician Assistant

## 2021-05-20 VITALS — BP 122/73 | HR 81 | Temp 97.8°F | Resp 17 | Wt 155.7 lb

## 2021-05-20 DIAGNOSIS — F17210 Nicotine dependence, cigarettes, uncomplicated: Secondary | ICD-10-CM | POA: Diagnosis present

## 2021-05-20 DIAGNOSIS — C3412 Malignant neoplasm of upper lobe, left bronchus or lung: Secondary | ICD-10-CM | POA: Diagnosis present

## 2021-05-20 DIAGNOSIS — C7931 Secondary malignant neoplasm of brain: Secondary | ICD-10-CM | POA: Diagnosis present

## 2021-05-20 DIAGNOSIS — Z7982 Long term (current) use of aspirin: Secondary | ICD-10-CM | POA: Insufficient documentation

## 2021-05-20 DIAGNOSIS — E279 Disorder of adrenal gland, unspecified: Secondary | ICD-10-CM | POA: Diagnosis present

## 2021-05-20 DIAGNOSIS — Z95828 Presence of other vascular implants and grafts: Secondary | ICD-10-CM

## 2021-05-20 DIAGNOSIS — T40425A Adverse effect of tramadol, initial encounter: Secondary | ICD-10-CM | POA: Diagnosis present

## 2021-05-20 DIAGNOSIS — F419 Anxiety disorder, unspecified: Secondary | ICD-10-CM | POA: Diagnosis present

## 2021-05-20 DIAGNOSIS — Z91030 Bee allergy status: Secondary | ICD-10-CM

## 2021-05-20 DIAGNOSIS — Z79899 Other long term (current) drug therapy: Secondary | ICD-10-CM | POA: Insufficient documentation

## 2021-05-20 DIAGNOSIS — E871 Hypo-osmolality and hyponatremia: Secondary | ICD-10-CM | POA: Insufficient documentation

## 2021-05-20 DIAGNOSIS — Z923 Personal history of irradiation: Secondary | ICD-10-CM

## 2021-05-20 DIAGNOSIS — Z882 Allergy status to sulfonamides status: Secondary | ICD-10-CM

## 2021-05-20 DIAGNOSIS — E861 Hypovolemia: Secondary | ICD-10-CM | POA: Diagnosis present

## 2021-05-20 DIAGNOSIS — T40605A Adverse effect of unspecified narcotics, initial encounter: Secondary | ICD-10-CM | POA: Diagnosis present

## 2021-05-20 DIAGNOSIS — Z9109 Other allergy status, other than to drugs and biological substances: Secondary | ICD-10-CM

## 2021-05-20 DIAGNOSIS — C7972 Secondary malignant neoplasm of left adrenal gland: Secondary | ICD-10-CM | POA: Insufficient documentation

## 2021-05-20 DIAGNOSIS — C61 Malignant neoplasm of prostate: Secondary | ICD-10-CM | POA: Diagnosis present

## 2021-05-20 DIAGNOSIS — I10 Essential (primary) hypertension: Secondary | ICD-10-CM | POA: Diagnosis present

## 2021-05-20 DIAGNOSIS — G8929 Other chronic pain: Secondary | ICD-10-CM | POA: Diagnosis present

## 2021-05-20 DIAGNOSIS — Z803 Family history of malignant neoplasm of breast: Secondary | ICD-10-CM

## 2021-05-20 DIAGNOSIS — Z8249 Family history of ischemic heart disease and other diseases of the circulatory system: Secondary | ICD-10-CM

## 2021-05-20 DIAGNOSIS — Z9221 Personal history of antineoplastic chemotherapy: Secondary | ICD-10-CM

## 2021-05-20 DIAGNOSIS — F1010 Alcohol abuse, uncomplicated: Secondary | ICD-10-CM | POA: Diagnosis present

## 2021-05-20 DIAGNOSIS — E222 Syndrome of inappropriate secretion of antidiuretic hormone: Principal | ICD-10-CM | POA: Diagnosis present

## 2021-05-20 DIAGNOSIS — M545 Low back pain, unspecified: Secondary | ICD-10-CM | POA: Diagnosis present

## 2021-05-20 DIAGNOSIS — K5730 Diverticulosis of large intestine without perforation or abscess without bleeding: Secondary | ICD-10-CM | POA: Diagnosis present

## 2021-05-20 DIAGNOSIS — N529 Male erectile dysfunction, unspecified: Secondary | ICD-10-CM | POA: Diagnosis present

## 2021-05-20 DIAGNOSIS — Z510 Encounter for antineoplastic radiation therapy: Secondary | ICD-10-CM | POA: Insufficient documentation

## 2021-05-20 DIAGNOSIS — K219 Gastro-esophageal reflux disease without esophagitis: Secondary | ICD-10-CM | POA: Diagnosis present

## 2021-05-20 DIAGNOSIS — Z9225 Personal history of immunosupression therapy: Secondary | ICD-10-CM

## 2021-05-20 DIAGNOSIS — Z888 Allergy status to other drugs, medicaments and biological substances status: Secondary | ICD-10-CM

## 2021-05-20 DIAGNOSIS — R590 Localized enlarged lymph nodes: Secondary | ICD-10-CM | POA: Diagnosis present

## 2021-05-20 DIAGNOSIS — F528 Other sexual dysfunction not due to a substance or known physiological condition: Secondary | ICD-10-CM | POA: Diagnosis present

## 2021-05-20 DIAGNOSIS — E785 Hyperlipidemia, unspecified: Secondary | ICD-10-CM | POA: Diagnosis present

## 2021-05-20 DIAGNOSIS — Z720 Tobacco use: Secondary | ICD-10-CM | POA: Diagnosis present

## 2021-05-20 DIAGNOSIS — Z8042 Family history of malignant neoplasm of prostate: Secondary | ICD-10-CM

## 2021-05-20 DIAGNOSIS — Z8546 Personal history of malignant neoplasm of prostate: Secondary | ICD-10-CM

## 2021-05-20 DIAGNOSIS — Z20822 Contact with and (suspected) exposure to covid-19: Secondary | ICD-10-CM | POA: Diagnosis present

## 2021-05-20 DIAGNOSIS — C7970 Secondary malignant neoplasm of unspecified adrenal gland: Secondary | ICD-10-CM | POA: Diagnosis present

## 2021-05-20 LAB — CBC
HCT: 30.2 % — ABNORMAL LOW (ref 39.0–52.0)
Hemoglobin: 11.2 g/dL — ABNORMAL LOW (ref 13.0–17.0)
MCH: 36.7 pg — ABNORMAL HIGH (ref 26.0–34.0)
MCHC: 37.1 g/dL — ABNORMAL HIGH (ref 30.0–36.0)
MCV: 99 fL (ref 80.0–100.0)
Platelets: 227 10*3/uL (ref 150–400)
RBC: 3.05 MIL/uL — ABNORMAL LOW (ref 4.22–5.81)
RDW: 12.5 % (ref 11.5–15.5)
WBC: 5.6 10*3/uL (ref 4.0–10.5)
nRBC: 0 % (ref 0.0–0.2)

## 2021-05-20 LAB — LIPID PANEL
Cholesterol: 123 mg/dL (ref 0–200)
HDL: 53 mg/dL (ref >40)
LDL Cholesterol: 61 mg/dL (ref 0–99)
Total CHOL/HDL Ratio: 2.3 RATIO
Triglycerides: 47 mg/dL (ref <150)
VLDL: 9 mg/dL (ref 0–40)

## 2021-05-20 LAB — CMP (CANCER CENTER ONLY)
ALT: 40 U/L (ref 0–44)
AST: 56 U/L — ABNORMAL HIGH (ref 15–41)
Albumin: 4 g/dL (ref 3.5–5.0)
Alkaline Phosphatase: 88 U/L (ref 38–126)
Anion gap: 10 (ref 5–15)
BUN: 9 mg/dL (ref 6–20)
CO2: 24 mmol/L (ref 22–32)
Calcium: 9.8 mg/dL (ref 8.9–10.3)
Chloride: 84 mmol/L — ABNORMAL LOW (ref 98–111)
Creatinine: 0.7 mg/dL (ref 0.61–1.24)
GFR, Estimated: >60 mL/min (ref >60)
Glucose, Bld: 102 mg/dL — ABNORMAL HIGH (ref 70–99)
Potassium: 4.5 mmol/L (ref 3.5–5.1)
Sodium: 118 mmol/L — CL (ref 135–145)
Total Bilirubin: 0.6 mg/dL (ref 0.3–1.2)
Total Protein: 7.8 g/dL (ref 6.5–8.1)

## 2021-05-20 LAB — CBC WITH DIFFERENTIAL (CANCER CENTER ONLY)
Abs Immature Granulocytes: 0.05 10*3/uL (ref 0.00–0.07)
Basophils Absolute: 0 10*3/uL (ref 0.0–0.1)
Basophils Relative: 1 %
Eosinophils Absolute: 0.5 10*3/uL (ref 0.0–0.5)
Eosinophils Relative: 7 %
HCT: 32.3 % — ABNORMAL LOW (ref 39.0–52.0)
Hemoglobin: 11.9 g/dL — ABNORMAL LOW (ref 13.0–17.0)
Immature Granulocytes: 1 %
Lymphocytes Relative: 5 %
Lymphs Abs: 0.4 10*3/uL — ABNORMAL LOW (ref 0.7–4.0)
MCH: 35.3 pg — ABNORMAL HIGH (ref 26.0–34.0)
MCHC: 36.8 g/dL — ABNORMAL HIGH (ref 30.0–36.0)
MCV: 95.8 fL (ref 80.0–100.0)
Monocytes Absolute: 0.5 10*3/uL (ref 0.1–1.0)
Monocytes Relative: 8 %
Neutro Abs: 5.5 10*3/uL (ref 1.7–7.7)
Neutrophils Relative %: 78 %
Platelet Count: 242 10*3/uL (ref 150–400)
RBC: 3.37 MIL/uL — ABNORMAL LOW (ref 4.22–5.81)
RDW: 12 % (ref 11.5–15.5)
WBC Count: 7 10*3/uL (ref 4.0–10.5)
nRBC: 0 % (ref 0.0–0.2)

## 2021-05-20 LAB — URINALYSIS, ROUTINE W REFLEX MICROSCOPIC
Bilirubin Urine: NEGATIVE
Glucose, UA: 50 mg/dL — AB
Hgb urine dipstick: NEGATIVE
Ketones, ur: NEGATIVE mg/dL
Leukocytes,Ua: NEGATIVE
Nitrite: NEGATIVE
Protein, ur: NEGATIVE mg/dL
Specific Gravity, Urine: 1.005 (ref 1.005–1.030)
pH: 7 (ref 5.0–8.0)

## 2021-05-20 LAB — CREATININE, URINE, RANDOM: Creatinine, Urine: 28.46 mg/dL

## 2021-05-20 LAB — BASIC METABOLIC PANEL
Anion gap: 6 (ref 5–15)
Anion gap: 9 (ref 5–15)
BUN: 10 mg/dL (ref 6–20)
BUN: 8 mg/dL (ref 6–20)
CO2: 26 mmol/L (ref 22–32)
CO2: 23 mmol/L (ref 22–32)
Calcium: 8.6 mg/dL — ABNORMAL LOW (ref 8.9–10.3)
Calcium: 8.9 mg/dL (ref 8.9–10.3)
Chloride: 89 mmol/L — ABNORMAL LOW (ref 98–111)
Chloride: 92 mmol/L — ABNORMAL LOW (ref 98–111)
Creatinine, Ser: 0.64 mg/dL (ref 0.61–1.24)
Creatinine, Ser: 0.57 mg/dL — ABNORMAL LOW (ref 0.61–1.24)
GFR, Estimated: >60 mL/min (ref >60)
GFR, Estimated: >60 mL/min (ref >60)
Glucose, Bld: 108 mg/dL — ABNORMAL HIGH (ref 70–99)
Glucose, Bld: 93 mg/dL (ref 70–99)
Potassium: 3.9 mmol/L (ref 3.5–5.1)
Potassium: 4.1 mmol/L (ref 3.5–5.1)
Sodium: 121 mmol/L — ABNORMAL LOW (ref 135–145)
Sodium: 124 mmol/L — ABNORMAL LOW (ref 135–145)

## 2021-05-20 LAB — SODIUM, URINE, RANDOM: Sodium, Ur: 47 mmol/L

## 2021-05-20 MED ORDER — DOCUSATE SODIUM 100 MG PO CAPS
100.0000 mg | ORAL_CAPSULE | Freq: Two times a day (BID) | ORAL | Status: DC
  Administered 2021-05-20 – 2021-05-23 (×2): 100 mg via ORAL
  Filled 2021-05-20 (×5): qty 1

## 2021-05-20 MED ORDER — SODIUM CHLORIDE 0.9 % IV SOLN: INTRAVENOUS | Status: DC

## 2021-05-20 MED ORDER — PANTOPRAZOLE SODIUM 40 MG PO TBEC
40.0000 mg | DELAYED_RELEASE_TABLET | Freq: Every day | ORAL | Status: DC
  Administered 2021-05-21 – 2021-05-22 (×2): 40 mg via ORAL
  Filled 2021-05-20 (×2): qty 1

## 2021-05-20 MED ORDER — ACETAMINOPHEN 325 MG PO TABS: 650.0000 mg | ORAL_TABLET | Freq: Four times a day (QID) | ORAL | Status: DC | PRN

## 2021-05-20 MED ORDER — MELATONIN 3 MG PO TABS
3.0000 mg | ORAL_TABLET | Freq: Once | ORAL | Status: AC
  Administered 2021-05-20: 3 mg via ORAL
  Filled 2021-05-20: qty 1

## 2021-05-20 MED ORDER — SODIUM CHLORIDE 0.9% FLUSH
10.0000 mL | Freq: Two times a day (BID) | INTRAVENOUS | Status: DC
  Administered 2021-05-21 – 2021-05-22 (×3): 10 mL

## 2021-05-20 MED ORDER — TRAMADOL HCL 50 MG PO TABS: 50.0000 mg | ORAL_TABLET | Freq: Two times a day (BID) | ORAL | Status: DC | PRN

## 2021-05-20 MED ORDER — ASPIRIN EC 81 MG PO TBEC
81.0000 mg | DELAYED_RELEASE_TABLET | Freq: Every day | ORAL | Status: DC
  Administered 2021-05-21 – 2021-05-23 (×3): 81 mg via ORAL
  Filled 2021-05-20 (×3): qty 1

## 2021-05-20 MED ORDER — SODIUM CHLORIDE 0.9% FLUSH: 10.0000 mL | INTRAVENOUS | Status: DC | PRN

## 2021-05-20 MED ORDER — SODIUM CHLORIDE 0.9 % IV SOLN
Freq: Once | INTRAVENOUS | Status: DC
  Filled 2021-05-20: qty 250

## 2021-05-20 MED ORDER — ONDANSETRON HCL 4 MG/2ML IJ SOLN
4.0000 mg | Freq: Four times a day (QID) | INTRAMUSCULAR | Status: DC | PRN
  Administered 2021-05-21 – 2021-05-23 (×3): 4 mg via INTRAVENOUS
  Filled 2021-05-20 (×3): qty 2

## 2021-05-20 MED ORDER — SODIUM CHLORIDE 0.9% FLUSH
10.0000 mL | Freq: Once | INTRAVENOUS | Status: AC
  Administered 2021-05-20: 10 mL
  Filled 2021-05-20: qty 10

## 2021-05-20 MED ORDER — ENOXAPARIN SODIUM 40 MG/0.4ML IJ SOSY
40.0000 mg | PREFILLED_SYRINGE | INTRAMUSCULAR | Status: DC
  Administered 2021-05-20 – 2021-05-22 (×3): 40 mg via SUBCUTANEOUS
  Filled 2021-05-20 (×3): qty 0.4

## 2021-05-20 MED ORDER — ONDANSETRON HCL 4 MG PO TABS: 4.0000 mg | ORAL_TABLET | Freq: Four times a day (QID) | ORAL | Status: DC | PRN

## 2021-05-20 MED ORDER — BUSPIRONE HCL 10 MG PO TABS: 10.0000 mg | ORAL_TABLET | Freq: Two times a day (BID) | ORAL | Status: DC

## 2021-05-20 MED ORDER — CHLORHEXIDINE GLUCONATE CLOTH 2 % EX PADS
6.0000 | MEDICATED_PAD | Freq: Every day | CUTANEOUS | Status: DC
  Administered 2021-05-20 – 2021-05-22 (×3): 6 via TOPICAL

## 2021-05-20 MED ORDER — SODIUM CHLORIDE 0.9 % IV SOLN
Freq: Once | INTRAVENOUS | Status: AC
  Filled 2021-05-20: qty 250

## 2021-05-20 NOTE — ED Notes (Signed)
Per cancer center, states Na 118-sending to ED for eval

## 2021-05-20 NOTE — ED Provider Notes (Signed)
Richard Santos   CSN: [id number] Arrival date & time: 05/20/21  1426     History Chief Complaint  Patient presents with   Abnormal Lab    Richard Santos is a 60 y.o. male.  Patient went to infusion center for small cell lung cancer today and they collected routine labs.  They called him to tell him to come to the emergency department because his sodium was extremely low.  Sodium levels collected at cancer center were 118.  Patient is completely asymptomatic.  Denies any headaches, changes in vision, weakness, changes in mental status, nausea, diarrhea, constipation.  He reports he vomited recently 1 time but that was because something got stuck in his throat.  That has been happening more and more over the last 6 months with the lung cancer treatment.   Past Medical History:  Diagnosis Date   Allergic rhinitis    Allergy    Anxiety    Chronic low back pain    DDD (degenerative disc disease), lumbar    ED (erectile dysfunction)    Finger injury    cut pads off 3rd and 4th finger left hand/due to lawn mower accident   GERD (gastroesophageal reflux disease)    Heart murmur    as child only-    History of kidney stones    HTN (hypertension) 08/20/2016   Hyperlipidemia    Incomplete right bundle branch block    Prostate cancer ([place]) dx 10/02/14   stage T1c   Sigmoid diverticulosis    Small cell lung cancer ([place]) 03/2020   Wears glasses     Patient Active Problem List   Diagnosis Date Noted   Hyponatremia 05/20/2021   Metastatic cancer to brain ([place]) 09/05/2020   Port-A-Cath in place 06/03/2020   Emphysema of lung ([place]) 04/26/2020   Small cell lung cancer, left upper lobe ([place]) 04/18/2020   Encounter for antineoplastic chemotherapy 04/18/2020   Encounter for antineoplastic immunotherapy 04/18/2020   Goals of care, counseling/discussion 04/18/2020   Syncope 04/11/2020   Lung mass 04/11/2020   History of prostate cancer  04/11/2020   Chest pain 04/03/2020   Vitamin D deficiency 12/15/2019   Dysphagia 07/17/2017   HTN (hypertension) 08/20/2016   Malignant neoplasm of prostate ([place]) 11/29/2014   Bee sting allergy 07/31/2014   Elevated PSA 04/14/2013   Lumbar disc disease 04/14/2013   Personal history of colonic adenoma 04/26/2012   Impaired glucose tolerance 02/27/2012   Encounter for well adult exam with abnormal findings 02/27/2012   Chronic low back pain    Alcohol abuse 12/31/2010   Functional Dyspepsia 03/11/2009   Hyperlipidemia 01/16/2008   Anxiety state 01/16/2008   ERECTILE DYSFUNCTION 01/16/2008   Tobacco abuse 01/16/2008   ALLERGIC RHINITIS 01/16/2008   GERD 01/16/2008    Past Surgical History:  Procedure Laterality Date   BRONCHIAL BIOPSY  04/12/2020   Procedure: BRONCHIAL BIOPSIES;  Surgeon: Marshell Garfinkel, MD;  Location: [facility];  Service: Cardiopulmonary;;   BRONCHIAL BRUSHINGS  04/12/2020   Procedure: BRONCHIAL BRUSHINGS;  Surgeon: Marshell Garfinkel, MD;  Location: [facility] [place];  Service: Cardiopulmonary;;   BRONCHIAL NEEDLE ASPIRATION BIOPSY  04/12/2020   Procedure: BRONCHIAL NEEDLE ASPIRATION BIOPSIES;  Surgeon: Marshell Garfinkel, MD;  Location: [facility];  Service: Cardiopulmonary;;   BRONCHIAL WASHINGS  04/12/2020   Procedure: BRONCHIAL WASHINGS;  Surgeon: Marshell Garfinkel, MD;  Location: [facility];  Service: Cardiopulmonary;;   COLONOSCOPY     COLONOSCOPY W/ POLYPECTOMY  04-19-2012   ENDOBRONCHIAL  ULTRASOUND N/A 04/12/2020   Procedure: ENDOBRONCHIAL ULTRASOUND;  Surgeon: Marshell Garfinkel, MD;  Location: [facility];  Service: Cardiopulmonary;  Laterality: N/A;   ESOPHAGOGASTRODUODENOSCOPY  08-05-2010   HEMOSTASIS CONTROL  04/12/2020   Procedure: HEMOSTASIS CONTROL;  Surgeon: Marshell Garfinkel, MD;  Location: [facility];  Service: Cardiopulmonary;;   IR IMAGING GUIDED PORT INSERTION  05/31/2020   POLYPECTOMY     PROSTATE BIOPSY  10/02/14   RADIOACTIVE SEED IMPLANT N/A  01/30/2015   Procedure: RADIOACTIVE SEED IMPLANT;  Surgeon: Rana Snare, MD;  Location: [facility];  Service: Urology;  Laterality: N/A;   UPPER GASTROINTESTINAL ENDOSCOPY  04/03/2021   VIDEO BRONCHOSCOPY N/A 04/12/2020   Procedure: VIDEO BRONCHOSCOPY WITHOUT FLUORO;  Surgeon: Marshell Garfinkel, MD;  Location: [facility];  Service: Cardiopulmonary;  Laterality: N/A;       Family History  Problem Relation Age of Onset   Prostate cancer Father    Heart disease Father    Heart disease Mother    Breast cancer Sister    Heart disease Other    Colon cancer Neg Hx    Colon polyps Neg Hx    Esophageal cancer Neg Hx    Rectal cancer Neg Hx    Stomach cancer Neg Hx     Social History   Tobacco Use   Smoking status: Every Day    Packs/day: 1.00    Years: 25.00    Pack years: 25.00    Types: Cigarettes   Smokeless tobacco: Never   Tobacco comments:    smokes 1/2 pack per day 02/11/2021  Vaping Use   Vaping Use: Former  Substance Use Topics   Alcohol use: Yes    Alcohol/week: 49.0 standard drinks    Types: 25 Cans of beer, 24 Standard drinks or equivalent per week    Comment: 5 beer per day   Drug use: No    Home Medications Prior to Admission medications   Medication Sig Start Date End Date Taking? Authorizing Provider  acetaminophen (TYLENOL) 325 MG tablet Take 2 tablets (650 mg total) by mouth every 6 (six) hours as needed for mild pain (or Fever >/= 101). 04/12/20   Regalado, Jerald Kief A, MD  aspirin EC 81 MG tablet Take 81 mg by mouth daily. Swallow whole.    [provider]  BREZTRI AEROSPHERE 160-9-4.8 MCG/ACT AERO INHALE 2 PUFFS INTO THE LUNGS IN THE MORNING AND AT BEDTIME. 03/14/21   Mannam, Praveen, MD  busPIRone (BUSPAR) 10 MG tablet TAKE 1 TABLET BY MOUTH TWICE A DAY 10/16/20   [name], MD  famotidine (PEPCID) 20 MG tablet Take 1 tablet (20 mg total) by mouth 2 (two) times daily. Patient not taking: Reported on 05/15/2021 05/09/21   [name], MD  lidocaine-prilocaine (EMLA) cream Apply 1 application topically as needed. 07/17/20   Heilingoetter, Cassandra L, PA-C  meloxicam (MOBIC) 15 MG tablet Take 15 mg by mouth daily. 09/22/20   [provider]  Multiple Vitamin (MULTIVITAMIN) capsule Take 1 capsule by mouth daily.    [provider]  ondansetron (ZOFRAN-ODT) 8 MG disintegrating tablet Take 1 tablet (8 mg total) by mouth every 8 (eight) hours as needed for nausea or vomiting. 03/14/21   Heilingoetter, Cassandra L, PA-C  pantoprazole (PROTONIX) 40 MG tablet Take 1 tablet (40 mg total) by mouth daily before breakfast. 05/06/21   [name], MD  prochlorperazine (COMPAZINE) 10 MG tablet Take 1 tablet (10 mg total) by mouth every 6 (six) hours as  needed. 11/25/20   Heilingoetter, Cassandra L, PA-C  Pseudoephedrine HCl (SUDAFED PO) Take 1 tablet by mouth daily. 2 week trial for ringing/congestion in ears Patient not taking: Reported on 05/15/2021    [provider]  traMADol (ULTRAM) 50 MG tablet TAKE 1/2 TABLET BY MOUTH TWICE A DAY AS NEEDED FOR PAIN 05/19/21   [name], MD  triamcinolone cream (KENALOG) 0.5 % Apply 1 application topically 3 (three) times daily. Patient not taking: Reported on 05/15/2021 06/04/20   Harle Stanford., PA-C    Allergies    Bee venom, Elemental sulfur, Sulfa antibiotics, and Namenda [memantine]  Review of Systems   Review of Systems  Constitutional:  Negative for chills and fever.  HENT:  Negative for congestion and rhinorrhea.   Eyes:  Positive for visual disturbance (Patient reports he has had worsening blurry vision over the last month).  Respiratory:  Negative for cough and shortness of breath.   Cardiovascular:  Negative for chest pain.  Gastrointestinal:  Negative for abdominal pain, constipation, diarrhea, nausea and vomiting.  Endocrine: Negative for polyuria.  Genitourinary:  Negative for decreased urine volume and dysuria.  Skin:  Negative for rash.   Neurological:  Negative for weakness and headaches.   Physical Exam Updated Vital Signs BP 123/69   Pulse 78   Temp 97.9 F (36.6 C) (Oral)   Resp 16   SpO2 99%   Physical Exam Vitals and nursing Santos reviewed.  Constitutional:      Appearance: He is well-developed.  HENT:     Head: Normocephalic and atraumatic.     Nose: No congestion or rhinorrhea.  Eyes:     Extraocular Movements: Extraocular movements intact.     Conjunctiva/sclera: Conjunctivae normal.     Pupils: Pupils are equal, round, and reactive to light.  Cardiovascular:     Rate and Rhythm: Normal rate and regular rhythm.     Heart sounds: No murmur heard. Pulmonary:     Effort: Pulmonary effort is normal. No respiratory distress.     Breath sounds: Normal breath sounds.  Abdominal:     Palpations: Abdomen is soft.     Tenderness: There is no abdominal tenderness.  Musculoskeletal:     Cervical back: Neck supple.  Skin:    General: Skin is warm and dry.  Neurological:     Mental Status: He is alert.    ED Results / Procedures / Treatments   Labs (all labs ordered are listed, but only abnormal results are displayed) Labs Reviewed  BASIC METABOLIC PANEL - Abnormal; Notable for the following components:      Result Value   Sodium 121 (*)    Chloride 89 (*)    Glucose, Bld 108 (*)    Calcium 8.6 (*)    All other components within normal limits  URINALYSIS, ROUTINE W REFLEX MICROSCOPIC - Abnormal; Notable for the following components:   Color, Urine STRAW (*)    Glucose, UA 50 (*)    All other components within normal limits  SARS CORONAVIRUS 2 (TAT 6-24 HRS)  SODIUM, URINE, RANDOM  CREATININE, URINE, RANDOM    EKG None  Radiology No results found.  Procedures Procedures   Medications Ordered in ED Medications  0.9 %  sodium chloride infusion (has no administration in time range)    ED Course  I have reviewed the triage vital signs and the nursing notes.  Pertinent labs & imaging  results that were available during my care of the patient were  reviewed by me and considered in my medical decision making (see chart for details).    MDM Rules/Calculators/A&P                          Patient is a 60 year old male with small cell lung cancer presenting today after going to the infusion center and they found that his sodium was 118.  On arrival patient is hemodynamically stable and has no complaints.  Repeat sodium, urine sodium, urinalysis ordered.  Repeat sodium came back at 121.  Patient remained asymptomatic.  Gentle hydration with normal saline at 125 mL/hr initiated.  Hospitalist service paged for admission for hyponatremia.  Urine sodium, urine creatinine pending at time of admission. Final Clinical Impression(s) / ED Diagnoses Final diagnoses:  Hyponatremia    Rx / DC Orders ED Discharge Orders     None        Gifford Shave, MD 05/20/21 1617    Lucrezia Starch, MD 05/21/21 1004

## 2021-05-20 NOTE — ED Triage Notes (Signed)
Brought from [facility], labs today, resulted Sodium 118. Hx of lung cancer, currently in immunotherapy.

## 2021-05-20 NOTE — H&P (Signed)
History and Physical    Richard Santos OIN:[id number] DOB: 18-Apr-1961 DOA: 05/20/2021  PCP: [name], MD   Patient coming from:  Home  I have personally briefly reviewed patient's old medical records in [place]  Chief Complaint: Sent from cancer center for electrolyte abnormalities.  HPI: Richard Santos is a 60 y.o. 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  Patient is getting immunotherapy and radiation therapy, recent blood work shows sodium 118.  Patient denies any symptoms, he denies any headache, dizziness, nausea, vomiting, palpitations, fever, weight loss, recent travel, sick contacts.  He is fully vaccinated against COVID.  ED Course: He is hemodynamically stable. HR 78, RR18, BP 123/69, SPO2 100% , Temp 98.4 Labs include: Sodium 118, potassium 4.5, chloride 84, bicarb 24 glucose 109, BUN 9, creatinine 0.70, calcium 9.8, anion gap 10, alkaline phosphatase 88, albumin 4.0, AST 56, ALT 40, total protein 7.8, WBC 7.0, hemoglobin 11.9, hematocrit 32.3, platelet 242, UA 50 glucose, nitrite negative, LE negative, bacteria negative.  Review of Systems:  Review of Systems  Constitutional: Negative.   HENT: Negative.    Eyes: Negative.   Respiratory: Negative.    Cardiovascular: Negative.   Gastrointestinal: Negative.   Genitourinary: Negative.   Musculoskeletal: Negative.   Skin: Negative.   Neurological: Negative.   Endo/Heme/Allergies: Negative.   Psychiatric/Behavioral: Negative.     Past Medical History:  Diagnosis Date   Allergic rhinitis    Allergy    Anxiety    Chronic low back pain    DDD (degenerative disc disease), lumbar     ED (erectile dysfunction)    Finger injury    cut pads off 3rd and 4th finger left hand/due to lawn mower accident   GERD (gastroesophageal reflux disease)    Heart murmur    as child only-    History of kidney stones    HTN (hypertension) 08/20/2016   Hyperlipidemia    Incomplete right bundle branch block    Prostate cancer ([place]) dx 10/02/14   stage T1c   Sigmoid diverticulosis    Small cell lung cancer ([place]) 03/2020   Wears glasses     Past Surgical History:  Procedure Laterality Date   BRONCHIAL BIOPSY  04/12/2020   Procedure: BRONCHIAL BIOPSIES;  Surgeon: Marshell Garfinkel, MD;  Location: [facility];  Service: Cardiopulmonary;;   BRONCHIAL BRUSHINGS  04/12/2020   Procedure: BRONCHIAL BRUSHINGS;  Surgeon: Marshell Garfinkel, MD;  Location[facility];  Service: Cardiopulmonary;;   BRONCHIAL NEEDLE ASPIRATION BIOPSY  04/12/2020   Procedure: BRONCHIAL NEEDLE ASPIRATION BIOPSIES;  Surgeon: Marshell Garfinkel, MD;  Location: [facility];  Service: Cardiopulmonary;;   BRONCHIAL WASHINGS  04/12/2020   Procedure: BRONCHIAL WASHINGS;  Surgeon: Marshell Garfinkel, MD;  Location: [facility];  Service: Cardiopulmonary;;   COLONOSCOPY     COLONOSCOPY W/ POLYPECTOMY  04-19-2012   ENDOBRONCHIAL ULTRASOUND N/A 04/12/2020   Procedure: ENDOBRONCHIAL ULTRASOUND;  Surgeon: Marshell Garfinkel, MD;  Location: [facility];  Service: Cardiopulmonary;  Laterality: N/A;   ESOPHAGOGASTRODUODENOSCOPY  08-05-2010   HEMOSTASIS CONTROL  04/12/2020   Procedure: HEMOSTASIS CONTROL;  Surgeon: Marshell Garfinkel, MD;  Location: [facility];  Service: Cardiopulmonary;;   IR IMAGING GUIDED PORT INSERTION  05/31/2020   POLYPECTOMY  PROSTATE BIOPSY  10/02/14   RADIOACTIVE SEED IMPLANT N/A 01/30/2015   Procedure: RADIOACTIVE SEED IMPLANT;  Surgeon: Rana Snare, MD;  Location: [facility];  Service: Urology;  Laterality: N/A;   UPPER GASTROINTESTINAL ENDOSCOPY  04/03/2021   VIDEO BRONCHOSCOPY N/A 04/12/2020    Procedure: VIDEO BRONCHOSCOPY WITHOUT FLUORO;  Surgeon: Marshell Garfinkel, MD;  Location: [facility];  Service: Cardiopulmonary;  Laterality: N/A;     reports that he has been smoking cigarettes. He has a 12.50 pack-year smoking history. He has never used smokeless tobacco. He reports current alcohol use of about 49.0 standard drinks of alcohol per week. He reports that he does not use drugs.  Allergies  Allergen Reactions   Bee Venom Swelling   Elemental Sulfur Other (See Comments)    Acute renal failure   Other Other (See Comments)    Chemo medicine- patient not sure about name - swelling & rashes   Sulfa Antibiotics    Namenda [Memantine] Nausea Only    Nausea and fatigue    Family History  Problem Relation Age of Onset   Prostate cancer Father    Heart disease Father    Heart disease Mother    Breast cancer Sister    Heart disease Other    Colon cancer Neg Hx    Colon polyps Neg Hx    Esophageal cancer Neg Hx    Rectal cancer Neg Hx    Stomach cancer Neg Hx    Family history reviewed and not pertinent.  Prior to Admission medications   Medication Sig Start Date End Date Taking? Authorizing Provider  acetaminophen (TYLENOL) 325 MG tablet Take 2 tablets (650 mg total) by mouth every 6 (six) hours as needed for mild pain (or Fever >/= 101). 04/12/20   Regalado, Jerald Kief A, MD  aspirin EC 81 MG tablet Take 81 mg by mouth daily. Swallow whole.    [provider]  BREZTRI AEROSPHERE 160-9-4.8 MCG/ACT AERO INHALE 2 PUFFS INTO THE LUNGS IN THE MORNING AND AT BEDTIME. 03/14/21   Mannam, Praveen, MD  busPIRone (BUSPAR) 10 MG tablet TAKE 1 TABLET BY MOUTH TWICE A DAY 10/16/20   [name], MD  famotidine (PEPCID) 20 MG tablet Take 1 tablet (20 mg total) by mouth 2 (two) times daily. Patient not taking: Reported on 05/15/2021 05/09/21   [name], MD  lidocaine-prilocaine (EMLA) cream Apply 1 application topically as needed. 07/17/20   Heilingoetter, Cassandra L, PA-C   meloxicam (MOBIC) 15 MG tablet Take 15 mg by mouth daily. 09/22/20   [provider]  Multiple Vitamin (MULTIVITAMIN) capsule Take 1 capsule by mouth daily.    [provider]  ondansetron (ZOFRAN-ODT) 8 MG disintegrating tablet Take 1 tablet (8 mg total) by mouth every 8 (eight) hours as needed for nausea or vomiting. 03/14/21   Heilingoetter, Cassandra L, PA-C  pantoprazole (PROTONIX) 40 MG tablet Take 1 tablet (40 mg total) by mouth daily before breakfast. 05/06/21   [name], MD  prochlorperazine (COMPAZINE) 10 MG tablet Take 1 tablet (10 mg total) by mouth every 6 (six) hours as needed. 11/25/20   Heilingoetter, Cassandra L, PA-C  Pseudoephedrine HCl (SUDAFED PO) Take 1 tablet by mouth daily. 2 week trial for ringing/congestion in ears Patient not taking: Reported on 05/15/2021    [provider]  traMADol (ULTRAM) 50 MG tablet TAKE 1/2 TABLET BY MOUTH TWICE A DAY AS NEEDED FOR PAIN 05/19/21   [name], MD  triamcinolone cream (  KENALOG) 0.5 % Apply 1 application topically 3 (three) times daily. Patient not taking: Reported on 05/15/2021 06/04/20   Harle Stanford., PA-C    Physical Exam: Vitals:   05/20/21 1448 05/20/21 1530  BP: 119/72 123/69  Pulse: 80 78  Resp: 16 16  Temp: 97.9 F (36.6 C)   TempSrc: Oral   SpO2: 100% 99%    Constitutional: NAD, calm, comfortable Vitals:   05/20/21 1448 05/20/21 1530  BP: 119/72 123/69  Pulse: 80 78  Resp: 16 16  Temp: 97.9 F (36.6 C)   TempSrc: Oral   SpO2: 100% 99%   Eyes: PERRL, lids and conjunctivae normal ENMT: Mucous membranes are moist. Posterior pharynx clear of any exudate or lesions.Normal dentition.  Neck: normal, supple, no masses, no thyromegaly Respiratory: clear to auscultation bilaterally, no wheezing, no crackles. Normal respiratory effort. No accessory muscle use.  Cardiovascular: Regular rate and rhythm, no murmurs / rubs / gallops. No extremity edema. 2+ pedal pulses. No carotid bruits.   Abdomen: no tenderness, no masses palpated. No hepatosplenomegaly. Bowel sounds positive.  Musculoskeletal: no clubbing / cyanosis. No joint deformity upper and lower extremities. Good ROM, no contractures. Normal muscle tone.  Skin: no rashes, lesions, ulcers. No induration Neurologic: CN 2-12 grossly intact. Sensation intact, DTR normal. Strength 5/5 in all 4.  Psychiatric: Normal judgment and insight. Alert and oriented x 3. Normal mood.   Labs on Admission: I have personally reviewed following labs and imaging studies  CBC: Recent Labs  Lab 05/20/21 1139  WBC 7.0  NEUTROABS 5.5  HGB 11.9*  HCT 32.3*  MCV 95.8  PLT 413   Basic Metabolic Panel: Recent Labs  Lab 05/20/21 1139 05/20/21 1518  NA 118* 121*  K 4.5 3.9  CL 84* 89*  CO2 24 26  GLUCOSE 102* 108*  BUN 9 10  CREATININE 0.70 0.64  CALCIUM 9.8 8.6*   GFR: Estimated Creatinine Clearance: 99.3 mL/min (by C-G formula based on SCr of 0.64 mg/dL). Liver Function Tests: Recent Labs  Lab 05/20/21 1139  AST 56*  ALT 40  ALKPHOS 88  BILITOT 0.6  PROT 7.8  ALBUMIN 4.0   No results for input(s): LIPASE, AMYLASE in the last 168 hours. No results for input(s): AMMONIA in the last 168 hours. Coagulation Profile: No results for input(s): INR, PROTIME in the last 168 hours. Cardiac Enzymes: No results for input(s): CKTOTAL, CKMB, CKMBINDEX, TROPONINI in the last 168 hours. BNP (last 3 results) No results for input(s): PROBNP in the last 8760 hours. HbA1C: No results for input(s): HGBA1C in the last 72 hours. CBG: No results for input(s): GLUCAP in the last 168 hours. Lipid Profile: No results for input(s): CHOL, HDL, LDLCALC, TRIG, CHOLHDL, LDLDIRECT in the last 72 hours. Thyroid Function Tests: No results for input(s): TSH, T4TOTAL, FREET4, T3FREE, THYROIDAB in the last 72 hours. Anemia Panel: No results for input(s): VITAMINB12, FOLATE, FERRITIN, TIBC, IRON, RETICCTPCT in the last 72 hours. Urine analysis:     Component Value Date/Time   COLORURINE STRAW (A) 05/20/2021 1530   APPEARANCEUR CLEAR 05/20/2021 1530   LABSPEC 1.005 05/20/2021 1530   PHURINE 7.0 05/20/2021 1530   GLUCOSEU 50 (A) 05/20/2021 1530   GLUCOSEU NEGATIVE 12/12/2019 1506   HGBUR NEGATIVE 05/20/2021 1530   BILIRUBINUR NEGATIVE 05/20/2021 [place] 05/20/2021 1530   PROTEINUR NEGATIVE 05/20/2021 1530   UROBILINOGEN 0.2 12/12/2019 1506   NITRITE NEGATIVE 05/20/2021 [place] 05/20/2021 [place]  on Admission: No results found.  EKG:  pending  Assessment/Plan Principal Problem:   Hyponatremia Active Problems:   Hyperlipidemia   ERECTILE DYSFUNCTION   Tobacco abuse   GERD   Alcohol abuse   Chronic low back pain   Malignant neoplasm of prostate ([facility])   HTN (hypertension)   History of prostate cancer   Small cell lung cancer, left upper lobe ([place])   Metastatic cancer to brain [facility]  Hyponatremia: Patient was sent from cancer center with sodium of 118. Patient denies any symptoms. Continue normal saline at 125 cc/h. Continue to monitor BMP every 4 hours. Obtain urine and serum osmolality. Monitor correction of sodium,  not more than 4 mm in 8 hours.  Hyperlipidemia : Not on any statins.  Obtain lipid profile  Hypertension: Hydralazine as needed.  Chronic back pain: Continue tramadol.  Metastasized small cell lung cancer : Continue outpatient oncology follow-up.  Anxiety disorder: Continue buspirone.  DVT prophylaxis: Lovenox Code Status: Full code. Family Communication: No family at bed side. Disposition Plan:   Status is: Observation  The patient remains OBS appropriate and will d/c before 2 midnights.  Dispo: The patient is from: Home              Anticipated d/c is to: Home              Patient currently is not medically stable to d/c.   Difficult to place patient No  Consults called: None Admission status:  Observation   Shawna Clamp MD [facility]   If 7PM-7AM, please contact night-coverage [email or web address] Password [facility]  05/20/2021, 5:46 PM

## 2021-05-20 NOTE — Progress Notes (Addendum)
Radiation Oncology         ([phone]) [phone] ________________________________  Outpatient Reconsultation - Conducted via telephone due to current COVID-19 concerns for limiting patient exposure  I spoke with the patient to conduct this consult visit via telephone to spare the patient unnecessary potential exposure in the healthcare setting during the current COVID-19 pandemic. The patient was notified in advance and was offered a [place] meeting to allow for face to face communication but unfortunately reported that they did not have the appropriate resources/technology to support such a visit and instead preferred to proceed with a telephone visit.  Name: Richard Santos        MRN: [id number]  Date of Service: 05/19/2021 DOB: 09-20-1961  HQ:IONG, Hunt Oris, MD  Curt Bears, MD     REFERRING PHYSICIAN: Curt Bears, MD   DIAGNOSIS: The primary encounter diagnosis was Metastasis to brain [facility]). Diagnoses of Small cell lung cancer, left upper lobe ([place]) and Metastatic cancer to brain [facility]) were also pertinent to this visit.   HISTORY OF PRESENT ILLNESS: Richard Santos is a 60 y.o. male  with a history of extensive stage small cell lung cancer.  The patient had a history of prostate cancer that was treated in 2016 with radioactive seed placement.  He was diagnosed with small cell carcinoma in May 2021 and received chemoradiation in the summer 2021, during his work-up he did have a 6 mm lesion in the brain along the cerebellum, however with repeat imaging on 08/03/2020, this was felt to be rather a subacute infarct and measured 4 mm.  He was counseled following the course of chemoradiation on prophylactic cranial irradiation and received this regimen in October 2021.  Of note he was unable to tolerate Namenda. He has been followed since without evidence of recurrent disease in the brain.  He also continues maintenance Imfinzi with Dr. Julien Nordmann, and recent imaging showed new disease in the left adrenal  gland. He's currently receiving palliative radiotherapy and has received 3 fractions so far. He had an MRI on 05/15/21 that showed 5 new lesions in the brain the largest was 9 mm in the left temporoparietal lobe with edema. The other lesions were 4 mm in the right temporal lobe, 5 mm in the right lateral basal ganglia/external capsule, 3.5 mm left frontal lobe, and 3 mm left head of caudate. The prior area described in the past as a subacute infarct was read as a treated lesion without recurrence. His case was discussed in multidisciplinary brain oncology conference. He's contacted to review these results.   PREVIOUS RADIATION THERAPY: Yes   05/15/21-present: the patient is currently receiving palliative radiotherapy to the left adrenal gland.  08/26/20-09/06/20 Prophylactic Cranial Irradiation (PCI)Treatment: The patient's whole brain was treated to 25 Gy in 10 fractions    04/25/20 - 05/15/20: The patient was treated to the disease within the left lung initially to a dose of 37.5 Gy in 15 fractions using a 3 field, 3-D conformal technique.    2016:  The prostate was treated with radioactive seeds with the following prescription: Prostate 14,500 cGy. Isotope: I-125 utilizing 79 seeds and 29 active needles.  Individual seed activity 0.47 mCi per seed for a total implant activity of 37.13 mCi.  PAST MEDICAL HISTORY:  Past Medical History:  Diagnosis Date   Allergic rhinitis    Allergy    Anxiety    Chronic low back pain    DDD (degenerative disc disease), lumbar    ED (erectile dysfunction)  Finger injury    cut pads off 3rd and 4th finger left hand/due to lawn mower accident   GERD (gastroesophageal reflux disease)    Heart murmur    as child only-    History of kidney stones    HTN (hypertension) 08/20/2016   Hyperlipidemia    Incomplete right bundle branch block    Prostate cancer ([place]) dx 10/02/14   stage T1c   Sigmoid diverticulosis    Small cell lung cancer ([place]) 03/2020    Wears glasses        PAST SURGICAL HISTORY: Past Surgical History:  Procedure Laterality Date   BRONCHIAL BIOPSY  04/12/2020   Procedure: BRONCHIAL BIOPSIES;  Surgeon: Marshell Garfinkel, MD;  Location: [place];  Service: Cardiopulmonary;;   BRONCHIAL BRUSHINGS  04/12/2020   Procedure: BRONCHIAL BRUSHINGS;  Surgeon: Marshell Garfinkel, MD;  Location: [place];  Service: Cardiopulmonary;;   BRONCHIAL NEEDLE ASPIRATION BIOPSY  04/12/2020   Procedure: BRONCHIAL NEEDLE ASPIRATION BIOPSIES;  Surgeon: Marshell Garfinkel, MD;  Location: [place];  Service: Cardiopulmonary;;   BRONCHIAL WASHINGS  04/12/2020   Procedure: BRONCHIAL WASHINGS;  Surgeon: Marshell Garfinkel, MD;  Location: [place];  Service: Cardiopulmonary;;   COLONOSCOPY     COLONOSCOPY W/ POLYPECTOMY  04-19-2012   ENDOBRONCHIAL ULTRASOUND N/A 04/12/2020   Procedure: ENDOBRONCHIAL ULTRASOUND;  Surgeon: Marshell Garfinkel, MD;  [facility];  Service: Cardiopulmonary;  Laterality: N/A;   ESOPHAGOGASTRODUODENOSCOPY  08-05-2010   HEMOSTASIS CONTROL  04/12/2020   Procedure: HEMOSTASIS CONTROL;  Surgeon: Marshell Garfinkel, MD;  Location: [place];  Service: Cardiopulmonary;;   IR IMAGING GUIDED PORT INSERTION  05/31/2020   POLYPECTOMY     PROSTATE BIOPSY  10/02/14   RADIOACTIVE SEED IMPLANT N/A 01/30/2015   Procedure: RADIOACTIVE SEED IMPLANT;  Surgeon: Rana Snare, MD;  [facility];  Service: Urology;  Laterality: N/A;   UPPER GASTROINTESTINAL ENDOSCOPY  04/03/2021   VIDEO BRONCHOSCOPY N/A 04/12/2020   Procedure: VIDEO BRONCHOSCOPY WITHOUT FLUORO;  Surgeon: Marshell Garfinkel, MD;  Location: [place];  Service: Cardiopulmonary;  Laterality: N/A;     FAMILY HISTORY:  Family History  Problem Relation Age of Onset   Prostate cancer Father    Heart disease Father    Heart disease Mother    Breast cancer Sister    Heart disease Other    Colon cancer Neg Hx    Colon polyps Neg Hx    Esophageal  cancer Neg Hx    Rectal cancer Neg Hx    Stomach cancer Neg Hx      SOCIAL HISTORY:  reports that he has been smoking cigarettes. He has a 25.00 pack-year smoking history. He has never used smokeless tobacco. He reports current alcohol use of about 49.0 standard drinks of alcohol per week. He reports that he does not use drugs. The patient is married and lives in [place]. 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   ALLERGIES: Bee venom, Elemental sulfur, Sulfa antibiotics, and Namenda [memantine]   MEDICATIONS:  Current Outpatient Medications  Medication Sig Dispense Refill   acetaminophen (TYLENOL) 325 MG tablet Take 2 tablets (650 mg total) by mouth every 6 (six) hours as needed for mild pain (or Fever >/= 101). 30 tablet 0   aspirin EC 81 MG tablet Take 81 mg by mouth daily. Swallow whole.     BREZTRI AEROSPHERE 160-9-4.8 MCG/ACT AERO INHALE 2 PUFFS INTO THE LUNGS IN THE MORNING AND AT BEDTIME. 10.7 g 10   busPIRone (BUSPAR) 10 MG  tablet TAKE 1 TABLET BY MOUTH TWICE A DAY 180 tablet 3   lidocaine-prilocaine (EMLA) cream Apply 1 application topically as needed. 30 g 2   meloxicam (MOBIC) 15 MG tablet Take 15 mg by mouth daily.     Multiple Vitamin (MULTIVITAMIN) capsule Take 1 capsule by mouth daily.     ondansetron (ZOFRAN-ODT) 8 MG disintegrating tablet Take 1 tablet (8 mg total) by mouth every 8 (eight) hours as needed for nausea or vomiting. 30 tablet 2   pantoprazole (PROTONIX) 40 MG tablet Take 1 tablet (40 mg total) by mouth daily before breakfast. 90 tablet 3   prochlorperazine (COMPAZINE) 10 MG tablet Take 1 tablet (10 mg total) by mouth every 6 (six) hours as needed. 30 tablet 2   famotidine (PEPCID) 20 MG tablet Take 1 tablet (20 mg total) by mouth 2 (two) times daily. (Patient not taking: Reported on 05/15/2021) 60 tablet 2   Pseudoephedrine HCl (SUDAFED PO) Take 1 tablet by mouth daily. 2 week trial for ringing/congestion in ears (Patient not taking: Reported on  05/15/2021)     traMADol (ULTRAM) 50 MG tablet TAKE 1/2 TABLET BY MOUTH TWICE A DAY AS NEEDED FOR PAIN 60 tablet 2   triamcinolone cream (KENALOG) 0.5 % Apply 1 application topically 3 (three) times daily. (Patient not taking: Reported on 05/15/2021) 90 g 1   No current facility-administered medications for this encounter.     REVIEW OF SYSTEMS:   On review of systems the patient reports that so far he seems to be doing well with his abdominal radiation and thankfully is not nauseated, having abdominal pain, or difficulty with bowel function. He reports his vision has been a bit more blurry but this is bilateral. He is going to follow up with ophthalmology. He denies any headaches, changes in speech, movement, or confusion. No other complaints are verbalized.   PHYSICAL EXAM:  Wt Readings from Last 3 Encounters:  05/06/21 158 lb 3.2 oz (71.8 kg)  04/22/21 154 lb 9.6 oz (70.1 kg)  04/03/21 153 lb (69.4 kg)   Unable to assess due to encounter type.   ECOG = 1  0 - Asymptomatic (Fully active, able to carry on all predisease activities without restriction)  1 - Symptomatic but completely ambulatory (Restricted in physically strenuous activity but ambulatory and able to carry out work of a light or sedentary nature. For example, light housework, office work)  2 - Symptomatic, <50% in bed during the day (Ambulatory and capable of all self care but unable to carry out any work activities. Up and about more than 50% of waking hours)  3 - Symptomatic, >50% in bed, but not bedbound (Capable of only limited self-care, confined to bed or chair 50% or more of waking hours)  4 - Bedbound (Completely disabled. Cannot carry on any self-care. Totally confined to bed or chair)  5 - Death   Eustace Pen MM, Creech RH, Tormey DC, et al. [phone]). "Toxicity and response criteria of the [facility]". [place] Oncol. 5 (6): 649-55    LABORATORY DATA:  Lab Results  Component Value Date    WBC 4.8 04/22/2021   HGB 11.2 (L) 04/22/2021   HCT 31.6 (L) 04/22/2021   MCV 100.6 (H) 04/22/2021   PLT 240 04/22/2021   Lab Results  Component Value Date   NA 136 04/22/2021   K 4.1 04/22/2021   CL 98 04/22/2021   CO2 27 04/22/2021   Lab Results  Component Value Date  ALT 37 04/22/2021   AST 49 (H) 04/22/2021   ALKPHOS 70 04/22/2021   BILITOT 0.4 04/22/2021      RADIOGRAPHY: MR Brain W Wo Contrast  Result Date: 05/16/2021 CLINICAL DATA:  Metastatic lung cancer post radiation. EXAM: MRI HEAD WITHOUT AND WITH CONTRAST TECHNIQUE: Multiplanar, multiecho pulse sequences of the brain and surrounding structures were obtained without and with intravenous contrast. CONTRAST:  87mL GADAVIST GADOBUTROL 1 MMOL/ML IV SOLN COMPARISON:  MRI head 02/13/2021 FINDINGS: Brain: New lesions: 9 mm necrotic lesion in the left temporoparietal lobe with surrounding edema. Axial image 134 4 mm necrotic lesion right temporal lobe with minimal edema. Axial image 172 5 mm enhancing lesion right lateral basal ganglia/external capsule. Axial image 190 3.5 mm lesion left frontal lobe axial image   195 3 mm lesion left head of caudate axial image 200. This image shows restricted diffusion. Stable lesions: Treated lesion left cerebellum without recurrence. Small amount of associated susceptibility. Ventricle size normal. Vascular: Normal arterial flow voids. Small developmental venous anomaly right medial parietal lobe unchanged. Skull and upper cervical spine: No focal skeletal metastasis identified. Sinuses/Orbits: Mild mucosal edema paranasal sinuses. Bilateral mastoid effusion similar to the prior MRI. 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   On: 05/16/2021 12:47        IMPRESSION/PLAN: 1. Extensive stage small cell lung cancer arising in the  left lung.  Again the patient's disease burden has been to some degree difficult to quite classify because of the fact that the follow-up MRI called the area in the brain subacute infarct, we treated him as though this was not involved with PCI, and he did complete a course of systemic therapy more similar to limited stage disease.  We discussed the new MRI findings and the discussion from brain oncology conference. It is recommended that he undergo salvage stereototactic radiosurgery. We discussed the risks, benefits, short, and long term effects of radiotherapy, as well as the curative intent, and the patient is interested in proceeding. We discussed the need to meet with neurosurgery and will coordinate for him to be seen soon. We reviewed that this course of radiotherapy would be a single fraction, and the patient prefers to coordinate simulation at a time he will already be at the cancer center. We will plan for simulation on Thursday or Friday of this week and I'll see him tomorrow when he comes for his adrenal radiation and we can sign written consent to proceed. 2. Blurry Vision. I encouraged the patient to still proceed with evaluation by his eye provider and I've sent a copy of the MRI to Dr. Ellin Mayhew in [place] who will see him. I do not think the volume of brain disease would be contributing to his symptoms.   Given current concerns for patient exposure during the COVID-19 pandemic, this encounter was conducted via telephone.  The patient has provided two factor identification and has given verbal consent for this type of encounter and has been advised to only accept a meeting of this type in a secure network environment. The time spent during this encounter was 30 minutes including preparation, discussion, and coordination of the patient's care. The attendants for this meeting include   Hayden Pedro  and Richard Santos and his wife Richard Santos. During the encounter  Hayden Pedro  was located at [facility] Rad[facility] Department.  DENVIL CANNING was located at home with his wife Richard Santos.     Carola Rhine, PAC

## 2021-05-21 ENCOUNTER — Ambulatory Visit
Admission: RE | Admit: 2021-05-21 | Discharge: 2021-05-21 | Disposition: A | Discharge status: Home / Self Care | Payer: 59 | Source: Ambulatory Visit | Attending: Radiation Oncology | Admitting: Radiation Oncology

## 2021-05-21 DIAGNOSIS — C3412 Malignant neoplasm of upper lobe, left bronchus or lung: Secondary | ICD-10-CM | POA: Diagnosis present

## 2021-05-21 DIAGNOSIS — N529 Male erectile dysfunction, unspecified: Secondary | ICD-10-CM | POA: Diagnosis present

## 2021-05-21 DIAGNOSIS — Z882 Allergy status to sulfonamides status: Secondary | ICD-10-CM | POA: Diagnosis not present

## 2021-05-21 DIAGNOSIS — I10 Essential (primary) hypertension: Secondary | ICD-10-CM | POA: Diagnosis present

## 2021-05-21 DIAGNOSIS — Z95828 Presence of other vascular implants and grafts: Secondary | ICD-10-CM | POA: Diagnosis not present

## 2021-05-21 DIAGNOSIS — Z510 Encounter for antineoplastic radiation therapy: Secondary | ICD-10-CM | POA: Diagnosis not present

## 2021-05-21 DIAGNOSIS — Z8249 Family history of ischemic heart disease and other diseases of the circulatory system: Secondary | ICD-10-CM | POA: Diagnosis not present

## 2021-05-21 DIAGNOSIS — Z8042 Family history of malignant neoplasm of prostate: Secondary | ICD-10-CM | POA: Diagnosis not present

## 2021-05-21 DIAGNOSIS — Z7982 Long term (current) use of aspirin: Secondary | ICD-10-CM | POA: Diagnosis not present

## 2021-05-21 DIAGNOSIS — M545 Low back pain, unspecified: Secondary | ICD-10-CM | POA: Diagnosis present

## 2021-05-21 DIAGNOSIS — F419 Anxiety disorder, unspecified: Secondary | ICD-10-CM | POA: Diagnosis present

## 2021-05-21 DIAGNOSIS — Z79899 Other long term (current) drug therapy: Secondary | ICD-10-CM | POA: Diagnosis not present

## 2021-05-21 DIAGNOSIS — Z9109 Other allergy status, other than to drugs and biological substances: Secondary | ICD-10-CM | POA: Diagnosis not present

## 2021-05-21 DIAGNOSIS — Z20822 Contact with and (suspected) exposure to covid-19: Secondary | ICD-10-CM | POA: Diagnosis present

## 2021-05-21 DIAGNOSIS — Z91030 Bee allergy status: Secondary | ICD-10-CM | POA: Diagnosis not present

## 2021-05-21 DIAGNOSIS — E785 Hyperlipidemia, unspecified: Secondary | ICD-10-CM | POA: Diagnosis present

## 2021-05-21 DIAGNOSIS — E871 Hypo-osmolality and hyponatremia: Secondary | ICD-10-CM | POA: Diagnosis present

## 2021-05-21 DIAGNOSIS — C7931 Secondary malignant neoplasm of brain: Secondary | ICD-10-CM | POA: Diagnosis present

## 2021-05-21 DIAGNOSIS — F1010 Alcohol abuse, uncomplicated: Secondary | ICD-10-CM | POA: Diagnosis present

## 2021-05-21 DIAGNOSIS — G8929 Other chronic pain: Secondary | ICD-10-CM | POA: Diagnosis present

## 2021-05-21 DIAGNOSIS — C7972 Secondary malignant neoplasm of left adrenal gland: Secondary | ICD-10-CM | POA: Diagnosis not present

## 2021-05-21 DIAGNOSIS — C7970 Secondary malignant neoplasm of unspecified adrenal gland: Secondary | ICD-10-CM | POA: Diagnosis present

## 2021-05-21 DIAGNOSIS — K5730 Diverticulosis of large intestine without perforation or abscess without bleeding: Secondary | ICD-10-CM | POA: Diagnosis present

## 2021-05-21 DIAGNOSIS — Z9221 Personal history of antineoplastic chemotherapy: Secondary | ICD-10-CM | POA: Diagnosis not present

## 2021-05-21 DIAGNOSIS — Z888 Allergy status to other drugs, medicaments and biological substances status: Secondary | ICD-10-CM | POA: Diagnosis not present

## 2021-05-21 DIAGNOSIS — Z923 Personal history of irradiation: Secondary | ICD-10-CM | POA: Diagnosis not present

## 2021-05-21 DIAGNOSIS — K219 Gastro-esophageal reflux disease without esophagitis: Secondary | ICD-10-CM | POA: Diagnosis present

## 2021-05-21 DIAGNOSIS — E222 Syndrome of inappropriate secretion of antidiuretic hormone: Secondary | ICD-10-CM | POA: Diagnosis present

## 2021-05-21 DIAGNOSIS — Z8546 Personal history of malignant neoplasm of prostate: Secondary | ICD-10-CM | POA: Diagnosis not present

## 2021-05-21 DIAGNOSIS — E861 Hypovolemia: Secondary | ICD-10-CM | POA: Diagnosis present

## 2021-05-21 DIAGNOSIS — E279 Disorder of adrenal gland, unspecified: Secondary | ICD-10-CM | POA: Diagnosis present

## 2021-05-21 LAB — BASIC METABOLIC PANEL
Anion gap: 6 (ref 5–15)
BUN: 7 mg/dL (ref 6–20)
CO2: 24 mmol/L (ref 22–32)
Calcium: 8.9 mg/dL (ref 8.9–10.3)
Chloride: 98 mmol/L (ref 98–111)
Creatinine, Ser: 0.51 mg/dL — ABNORMAL LOW (ref 0.61–1.24)
GFR, Estimated: >60 mL/min (ref >60)
Glucose, Bld: 91 mg/dL (ref 70–99)
Potassium: 3.7 mmol/L (ref 3.5–5.1)
Sodium: 128 mmol/L — ABNORMAL LOW (ref 135–145)

## 2021-05-21 LAB — HIV ANTIBODY (ROUTINE TESTING W REFLEX): HIV Screen 4th Generation wRfx: NONREACTIVE

## 2021-05-21 LAB — CBC
HCT: 30.7 % — ABNORMAL LOW (ref 39.0–52.0)
Hemoglobin: 10.8 g/dL — ABNORMAL LOW (ref 13.0–17.0)
MCH: 35.6 pg — ABNORMAL HIGH (ref 26.0–34.0)
MCHC: 35.2 g/dL (ref 30.0–36.0)
MCV: 101.3 fL — ABNORMAL HIGH (ref 80.0–100.0)
Platelets: 242 10*3/uL (ref 150–400)
RBC: 3.03 MIL/uL — ABNORMAL LOW (ref 4.22–5.81)
RDW: 12.5 % (ref 11.5–15.5)
WBC: 4.4 10*3/uL (ref 4.0–10.5)
nRBC: 0 % (ref 0.0–0.2)

## 2021-05-21 LAB — SODIUM
Sodium: 127 mmol/L — ABNORMAL LOW (ref 135–145)
Sodium: 126 mmol/L — ABNORMAL LOW (ref 135–145)

## 2021-05-21 LAB — SARS CORONAVIRUS 2 (TAT 6-24 HRS): SARS Coronavirus 2: NEGATIVE

## 2021-05-21 MED ORDER — SODIUM CHLORIDE 0.9 % IV SOLN: INTRAVENOUS | Status: DC

## 2021-05-21 NOTE — Progress Notes (Signed)
PROGRESS NOTE    Richard Santos  BJS:[id number] DOB: 12-10-1960 DOA: 05/20/2021 PCP: [name], MD   Brief Narrative:  HPI: Richard Santos is a 60 y.o. 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  Patient is getting immunotherapy and radiation therapy, recent blood work shows sodium 118.  Patient denies any symptoms, he denies any headache, dizziness, nausea, vomiting, palpitations, fever, weight loss, recent travel, sick contacts.  He is fully vaccinated against COVID.   ED Course: He is hemodynamically stable. HR 78, RR18, BP 123/69, SPO2 100% , Temp 98.4 Labs include: Sodium 118, potassium 4.5, chloride 84, bicarb 24 glucose 109, BUN 9, creatinine 0.70, calcium 9.8, anion gap 10, alkaline phosphatase 88, albumin 4.0, AST 56, ALT 40, total protein 7.8, WBC 7.0, hemoglobin 11.9, hematocrit 32.3, platelet 242, UA 50 glucose, nitrite negative, LE negative, bacteria negative.  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  Patient sodium was 118 yesterday.  Improved to 128 with IV fluids.  Would like for correction to slow down a little bit.  Will stop IV fluids.  Check sodium every 8 hours.  Neck stable at 2 PM.  If not improvement, will start on low frequency of normal saline.   Patient remains asymptomatic.   Chronic back pain: Continue tramadol.   Metastasized small cell lung cancer : Continue outpatient oncology follow-up.  Continue current radiation.   Anxiety disorder: Continue buspirone.  Please note that patient does not have any history of hyperlipidemia or hypertension.  Lipid panel checked here does not show hyperlipidemia.  DVT prophylaxis: enoxaparin (LOVENOX) injection 40 mg Start: 05/20/21 2000   Code Status: Full Code  Family Communication:  None present at bedside.  Plan of care discussed with patient in length and he verbalized understanding and agreed with it.  Status is: Inpatient  Remains inpatient appropriate because:Persistent severe electrolyte disturbances  Dispo: The patient is from: Home              Anticipated d/c is to: Home              Patient currently is not medically stable to d/c.   Difficult to place patient No        Estimated body mass index is 21.12 kg/m as calculated from the following:   Height as of 05/06/21: 6' (1.829 m).   Weight as of an earlier encounter on 05/20/21: 70.6 kg.      Nutritional status:               Consultants:  None  Procedures:  None  Antimicrobials:  Anti-infectives (From admission, onward)    None          Subjective: Seen and examined.  No complaints.  Objective: Vitals:   05/21/21 0000 05/21/21 0409 05/21/21 6073  05/21/21 1203  BP: 125/82 130/84 129/83 119/75  Pulse: 79 75 77 76  Resp: 18 18 14 16   Temp: 98.2 F (36.8 C) 98.3 F (36.8 C) 98.6 F (37 C) 98 F (36.7 C)  TempSrc: Oral  Oral   SpO2: 98% 99% 99% 98%    Intake/Output Summary (Last 24 hours) at 05/21/2021 1351 Last data filed at 05/21/2021 1100 Gross per 24 hour  Intake 2193.44 ml  Output 950 ml  Net 1243.44 ml   There were no vitals filed for this visit.  Examination:  General exam: Appears calm and comfortable  Respiratory system: Clear to auscultation. Respiratory  effort normal. Cardiovascular system: S1 & S2 heard, RRR. No JVD, murmurs, rubs, gallops or clicks. No pedal edema. Gastrointestinal system: Abdomen is nondistended, soft and nontender. No organomegaly or masses felt. Normal bowel sounds heard. Central nervous system: Alert and oriented. No focal neurological deficits. Extremities: Symmetric 5 x 5 power. Skin: No rashes, lesions or ulcers Psychiatry: Judgement and insight appear normal. Mood & affect appropriate.    Data Reviewed: I have personally reviewed following labs and imaging studies  CBC: Recent Labs  Lab 05/20/21 1139 05/20/21 2019 05/21/21 0407  WBC 7.0 5.6 4.4  NEUTROABS 5.5  --   --   HGB 11.9* 11.2* 10.8*  HCT 32.3* 30.2* 30.7*  MCV 95.8 99.0 101.3*  PLT 242 227 831   Basic Metabolic Panel: Recent Labs  Lab 05/20/21 1139 05/20/21 1518 05/20/21 2019 05/21/21 0407  NA 118* 121* 124* 128*  K 4.5 3.9 4.1 3.7  CL 84* 89* 92* 98  CO2 24 26 23 24   GLUCOSE 102* 108* 93 91  BUN 9 10 8 7   CREATININE 0.70 0.64 0.57* 0.51*  CALCIUM 9.8 8.6* 8.9 8.9   GFR: Estimated Creatinine Clearance: 99.3 mL/min (A) (by C-G formula based on SCr of 0.51 mg/dL (L)). Liver Function Tests: Recent Labs  Lab 05/20/21 1139  AST 56*  ALT 40  ALKPHOS 88  BILITOT 0.6  PROT 7.8  ALBUMIN 4.0   No results for input(s): LIPASE, AMYLASE in the last 168 hours. No results for input(s): AMMONIA in the last 168 hours. Coagulation Profile: No results for input(s): INR, PROTIME in the last 168 hours. Cardiac Enzymes: No results for input(s): CKTOTAL, CKMB, CKMBINDEX, TROPONINI in the last 168 hours. BNP (last 3 results) No results for input(s): PROBNP in the last 8760 hours. HbA1C: No results for input(s): HGBA1C in the last 72 hours. CBG: No results for input(s): GLUCAP in the last 168 hours. Lipid Profile: Recent Labs    05/20/21 2019  CHOL 123  HDL 53  LDLCALC 61  TRIG 47  CHOLHDL 2.3   Thyroid Function Tests: No results  for input(s): TSH, T4TOTAL, FREET4, T3FREE, THYROIDAB in the last 72 hours. Anemia Panel: No results for input(s): VITAMINB12, FOLATE, FERRITIN, TIBC, IRON, RETICCTPCT in the last 72 hours. Sepsis Labs: No results for input(s): PROCALCITON, LATICACIDVEN in the last 168 hours.  Recent Results (from the past 240 hour(s))  SARS CORONAVIRUS 2 (TAT 6-24 HRS) Nasopharyngeal Nasopharyngeal Swab     Status: None   Collection Time: 05/20/21  4:20 PM   Specimen: Nasopharyngeal Swab  Result Value Ref Range Status   SARS Coronavirus 2 NEGATIVE NEGATIVE Final    Comment: (NOTE) SARS-CoV-2 target nucleic acids are NOT DETECTED.  The SARS-CoV-2 RNA is generally detectable in upper and lower respiratory specimens during the acute phase of infection. Negative results do not preclude SARS-CoV-2 infection, do  not rule out co-infections with other pathogens, and should not be used as the sole basis for treatment or other patient management decisions. Negative results must be combined with clinical observations, patient history, and epidemiological information. The expected result is Negative.  Fact Sheet for Patients: SugarRoll.be  Fact Sheet for Healthcare Providers: https://www.woods-mathews.com/  This test is not yet approved or cleared by the [place] FDA and  has been authorized for detection and/or diagnosis of SARS-CoV-2 by FDA under an Emergency Use Authorization (EUA). This EUA will remain  in effect (meaning this test can be used) for the duration of the COVID-19 declaration under Se ction 564(b)(1) of the Act, 21 U.S.C. section 360bbb-3(b)(1), unless the authorization is terminated or revoked sooner.  Performed at [facility], [place] [address], [place], [place] [address]       Radiology Studies: No results found.  Scheduled Meds:  aspirin EC  81 mg Oral Daily   Chlorhexidine Gluconate Cloth  6 each Topical Daily   docusate  sodium  100 mg Oral BID   enoxaparin (LOVENOX) injection  40 mg Subcutaneous Q24H   pantoprazole  40 mg Oral QAC breakfast   sodium chloride flush  10-40 mL Intracatheter Q12H   Continuous Infusions:   LOS: 0 days   Time spent: 32 minutes   Darliss Cheney, MD [facility]  05/21/2021, 1:51 PM   How to contact the [facility] Attending or Consulting provider [place] or covering provider during after hours [place], for this patient?  Check the care team in [facility] and look for a) attending/consulting [facility] provider listed and b) the [facility] team listed. Page or secure chat [phone]. Log into [email or web address] and use [place]'s universal password to access. If you do not have the password, please contact the hospital operator. Locate the [facility] provider you are looking for under [facility] and page to a number that you can be directly reached. If you still have difficulty reaching the provider, please page the [facility] (Director on Call) for the Hospitalists listed on amion for assistance.

## 2021-05-22 ENCOUNTER — Inpatient Hospital Stay (HOSPITAL_COMMUNITY): Payer: 59

## 2021-05-22 ENCOUNTER — Ambulatory Visit
Admission: RE | Admit: 2021-05-22 | Discharge: 2021-05-22 | Disposition: A | Discharge status: Home / Self Care | Payer: 59 | Source: Ambulatory Visit | Attending: Radiation Oncology | Admitting: Radiation Oncology

## 2021-05-22 ENCOUNTER — Ambulatory Visit: Payer: 59 | Admitting: Radiation Oncology

## 2021-05-22 DIAGNOSIS — Z510 Encounter for antineoplastic radiation therapy: Secondary | ICD-10-CM | POA: Diagnosis not present

## 2021-05-22 DIAGNOSIS — E871 Hypo-osmolality and hyponatremia: Secondary | ICD-10-CM

## 2021-05-22 DIAGNOSIS — C3412 Malignant neoplasm of upper lobe, left bronchus or lung: Secondary | ICD-10-CM

## 2021-05-22 LAB — SODIUM
Sodium: 127 mmol/L — ABNORMAL LOW (ref 135–145)
Sodium: 128 mmol/L — ABNORMAL LOW (ref 135–145)
Sodium: 123 mmol/L — ABNORMAL LOW (ref 135–145)

## 2021-05-22 LAB — OSMOLALITY: Osmolality: 269 mOsm/kg — ABNORMAL LOW (ref 275–295)

## 2021-05-22 LAB — OSMOLALITY, URINE: Osmolality, Ur: 570 mOsm/kg (ref 300–900)

## 2021-05-22 MED ORDER — SODIUM BICARBONATE 650 MG PO TABS
650.0000 mg | ORAL_TABLET | Freq: Two times a day (BID) | ORAL | Status: DC
  Administered 2021-05-22 – 2021-05-23 (×2): 650 mg via ORAL
  Filled 2021-05-22 (×2): qty 1

## 2021-05-22 MED ORDER — IOHEXOL 9 MG/ML PO SOLN
500.0000 mL | ORAL | Status: AC
  Administered 2021-05-22 (×2): 500 mL via ORAL

## 2021-05-22 MED ORDER — SODIUM CHLORIDE (PF) 0.9 % IJ SOLN
INTRAMUSCULAR | Status: AC
  Filled 2021-05-22: qty 50

## 2021-05-22 MED ORDER — IOHEXOL 300 MG/ML  SOLN
100.0000 mL | Freq: Once | INTRAMUSCULAR | Status: AC | PRN
  Administered 2021-05-22: 100 mL via INTRAVENOUS

## 2021-05-22 NOTE — Progress Notes (Signed)
HEMATOLOGY-ONCOLOGY PROGRESS NOTE  SUBJECTIVE: Richard Santos is followed by our office for extensive stage small cell lung cancer.  He is currently receiving palliative maintenance immunotherapy every 4 weeks.  He is also receiving palliative radiation to his enlarging left adrenal nodules with last treatment expected on 06/05/2021.  He was seen in our office on 6/28 and was found to have a sodium level of 118.  He was noted to be more confused and forgetful.  He was sent to the emergency department for further evaluation of his hyponatremia.  There is concern this may be secondary to paraneoplastic syndrome from worsening small cell lung cancer.  Restaging CT scan of the chest/abdomen/pelvis have been recommended.  The patient is feeling somewhat better.  He still has some generalized weakness.  Overall, he feels that he is less confused.  He has no headaches, dizziness, chest pain, shortness of breath.  Denies abdominal pain, nausea, vomiting.  He is receiving IV fluids and sodium this morning has improved to 127.  Oncology History  Small cell lung cancer, left upper lobe ([place])  04/18/2020 Initial Diagnosis   Small cell lung cancer, left upper lobe ([place])    05/06/2020 -  Chemotherapy    Patient is on Treatment Plan: LUNG SMALL CELL EXTENSIVE STAGE DURVALUMAB + CARBOPLATIN D1 + ETOPOSIDE D1-3 Q21D X 4 CYCLES / DURVALUMAB Q28D       12/25/2020 Cancer Staging   Staging form: Lung, AJCC 8th Edition - Clinical: Stage IVB (cT2b, cN2, cM1c) - Signed by Richard Bears, MD on 12/25/2020       REVIEW OF SYSTEMS:   Constitutional: Denies fevers, chills  Eyes: Denies blurriness of vision Ears, nose, mouth, throat, and face: Denies mucositis or sore throat Respiratory: Denies cough, dyspnea or wheezes Cardiovascular: Denies palpitation, chest discomfort Gastrointestinal:  Denies nausea, heartburn or change in bowel habits Skin: Denies abnormal skin rashes Lymphatics: Denies new lymphadenopathy or easy  bruising Neurological:Denies numbness, tingling or new weaknesses Behavioral/Psych: Mood is stable, no new changes  Extremities: No lower extremity edema All other systems were reviewed with the patient and are negative.  I have reviewed the past medical history, past surgical history, social history and family history with the patient and they are unchanged from previous note.   PHYSICAL EXAMINATION: ECOG PERFORMANCE STATUS: 1 - Symptomatic but completely ambulatory  Vitals:   05/21/21 2052 05/22/21 0621  BP: 118/76 126/84  Pulse: 83 79  Resp: 18 18  Temp: 98.1 F (36.7 C) 98.1 F (36.7 C)  SpO2: 100% 98%   There were no vitals filed for this visit.  Intake/Output from previous day: 06/29 0701 - 06/30 0700 In: 2246.8 [P.O.:240; I.V.:2006.8] Out: 975 [Urine:975]  GENERAL:alert, no distress and comfortable SKIN: skin color, texture, turgor are normal, no rashes or significant lesions EYES: normal, Conjunctiva are pink and non-injected, sclera clear LUNGS: clear to auscultation and percussion with normal breathing effort HEART: regular rate & rhythm and no murmurs and no lower extremity edema ABDOMEN:abdomen soft, non-tender and normal bowel sounds NEURO: alert & oriented x 3 with fluent speech, no focal motor/sensory deficits  LABORATORY DATA:  I have reviewed the data as listed CMP Latest Ref Rng & Units 05/22/2021 05/21/2021 05/21/2021  Glucose 70 - 99 mg/dL - - -  BUN 6 - 20 mg/dL - - -  Creatinine 0.61 - 1.24 mg/dL - - -  Sodium 135 - 145 mmol/L 127(L) 126(L) 127(L)  Potassium 3.5 - 5.1 mmol/L - - -  Chloride 98 -  111 mmol/L - - -  CO2 22 - 32 mmol/L - - -  Calcium 8.9 - 10.3 mg/dL - - -  Total Protein 6.5 - 8.1 g/dL - - -  Total Bilirubin 0.3 - 1.2 mg/dL - - -  Alkaline Phos 38 - 126 U/L - - -  AST 15 - 41 U/L - - -  ALT 0 - 44 U/L - - -    Lab Results  Component Value Date   WBC 4.4 05/21/2021   HGB 10.8 (L) 05/21/2021   HCT 30.7 (L) 05/21/2021   MCV  101.3 (H) 05/21/2021   PLT 242 05/21/2021   NEUTROABS 5.5 05/20/2021    MR Brain W Wo Contrast  Result Date: 05/16/2021 CLINICAL DATA:  Metastatic lung cancer post radiation. EXAM: MRI HEAD WITHOUT AND WITH CONTRAST TECHNIQUE: Multiplanar, multiecho pulse sequences of the brain and surrounding structures were obtained without and with intravenous contrast. CONTRAST:  65mL GADAVIST GADOBUTROL 1 MMOL/ML IV SOLN COMPARISON:  MRI head 02/13/2021 FINDINGS: Brain: New lesions: 9 mm necrotic lesion in the left temporoparietal lobe with surrounding edema. Axial image 134 4 mm necrotic lesion right temporal lobe with minimal edema. Axial image 172 5 mm enhancing lesion right lateral basal ganglia/external capsule. Axial image 190 3.5 mm lesion left frontal lobe axial image   195 3 mm lesion left head of caudate axial image 200. This image shows restricted diffusion. Stable lesions: Treated lesion left cerebellum without recurrence. Small amount of associated susceptibility. Ventricle size normal. Vascular: Normal arterial flow voids. Small developmental venous anomaly right medial parietal lobe unchanged. Skull and upper cervical spine: No focal skeletal metastasis identified. Sinuses/Orbits: Mild mucosal edema paranasal sinuses. Bilateral mastoid effusion similar to the prior MRI. 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   On: 05/16/2021 12:47    ASSESSMENT AND PLAN: This is a very pleasant 60 year old Caucasian male diagnosed with extensive stage small cell lung cancer.  He presented with a left upper lobe lung mass in addition to left hilar mediastinal lymphadenopathy.  He also had metastatic disease to the brain.  He was diagnosed in May 2021.   He underwent palliative radiotherapy to the left hilar and mediastinal lymphadenopathy in addition to  radiotherapy to the brain in October 2021.   He is currently undergoing systemic chemotherapy with carboplatin for an AUC of 5 on day 1, etoposide 100 mg per metered squared on days 1, 2, and 3 with cosela infusion before chemotherapy.  He is status post 13 cycles of treatment.  Starting from cycle #5, the patient has been on maintenance immunotherapy IV every 4 weeks.   He is tolerating this well.    The patient is currently undergoing palliative radiation to the enlarging left adrenal nodules under the care of Dr. Lisbeth Renshaw.  His last treatment is expected on 06/05/2021.  He has been admitted for hyponatremia.  He is receiving IV fluids and sodium level is slowly improving.  Serum and urine osmolalities are currently pending.  I discussed with the patient and his wife that hyponatremia can also be associated with worsening small cell lung cancer.  Dr. Julien Nordmann recommends a restaging CT chest, abdomen, pelvis.  I have placed an order for these with contrast to be performed today or tomorrow.  I advised the patient and his wife that they do not need to stay in the  hospital to await these results.  We can discuss these further as an outpatient.  From our standpoint, okay to discharge when otherwise medically stable.  We will arrange for outpatient follow-up in about 1 week.  The total time spent in the visit was 30 minutes. Greater than 50%  of this time was spent counseling and coordinating care related to the above assessment and plan.    LOS: 1 day   Richard Bussing, DNP, AGPCNP-BC, AOCNP 05/22/21

## 2021-05-22 NOTE — Progress Notes (Signed)
PROGRESS NOTE    Richard Santos  WUJ:[id number] DOB: 1961-10-10 DOA: 05/20/2021 PCP: [name], MD   Brief Narrative:  HPI: Richard Santos is a 60 y.o. 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  Patient is getting immunotherapy and radiation therapy, recent blood work shows sodium 118.  Patient denies any symptoms, he denies any headache, dizziness, nausea, vomiting, palpitations, fever, weight loss, recent travel, sick contacts.  He is fully vaccinated against COVID.   ED Course: He is hemodynamically stable. HR 78, RR18, BP 123/69, SPO2 100% , Temp 98.4 Labs include: Sodium 118, potassium 4.5, chloride 84, bicarb 24 glucose 109, BUN 9, creatinine 0.70, calcium 9.8, anion gap 10, alkaline phosphatase 88, albumin 4.0, AST 56, ALT 40, total protein 7.8, WBC 7.0, hemoglobin 11.9, hematocrit 32.3, platelet 242, UA 50 glucose, nitrite negative, LE negative, bacteria negative.  Assessment & Plan:   Principal Problem:   Hyponatremia Active Problems:   Hyperlipidemia   ERECTILE DYSFUNCTION   Tobacco abuse   GERD   Alcohol abuse   Chronic low back pain   Malignant neoplasm of prostate ([facility])   HTN (hypertension)   History of prostate cancer   Small cell lung cancer, left upper lobe ([facility])   Metastatic cancer to brain ([place])   Acute hyponatremia  Acute hyponatremia: Likely hypovolemic hyponatremia.  Patient sodium was 118 on the day of admission which improved to 128.  IV fluids slowed down.  Sodium has actually been stable and currently 127.  Urine spot sodium is 47 but no serum or urine osmolalities has been done so I have ordered those today.  Based off of that,  we will know whether this is SIADH which is highly likely since he has lung cancer.  Continue gentle IV hydration and monitor sodium every 6 hours.   Chronic back pain: Continue tramadol.   Metastasized small cell lung cancer : Continue outpatient oncology follow-up.  Continue current radiation.   Anxiety disorder: Continue buspirone.  Please note that patient does not have any history of hyperlipidemia or hypertension.  Lipid panel checked here does not show hyperlipidemia.   DVT prophylaxis: enoxaparin (LOVENOX) injection 40 mg Start: 05/20/21 2000   Code Status: Full Code  Family Communication:  None present at bedside.  Plan of care discussed with patient in length and he verbalized understanding and agreed with it.  Status is: Inpatient  Remains inpatient appropriate because:Persistent severe electrolyte disturbances  Dispo: The patient is from: Home              Anticipated d/c is to: Home              Patient currently is not medically stable to d/c.   Difficult to place patient No        Estimated body mass index is 21.12 kg/m as calculated from the following:   Height as of 05/06/21: 6' (1.829 m).   Weight as of an earlier encounter on 05/20/21: 70.6 kg.      Nutritional status:               Consultants:  None  Procedures:  None  Antimicrobials:  Anti-infectives (From admission, onward)    None  Subjective: Seen and examined.  He has no complaints.  He is in fact anxious to get out of here.  Objective: Vitals:   05/21/21 0819 05/21/21 1203 05/21/21 2052 05/22/21 0621  BP: 129/83 119/75 118/76 126/84  Pulse: 77 76 83 79  Resp: 14 16 18 18   Temp: 98.6 F (37 C) 98 F (36.7 C) 98.1 F (36.7 C) 98.1 F (36.7 C)  TempSrc: Oral     SpO2: 99% 98% 100% 98%    Intake/Output Summary (Last 24 hours) at 05/22/2021 1122 Last data filed at 05/22/2021 0900 Gross per 24 hour  Intake 413.37 ml  Output 1175 ml  Net -761.63 ml     There were no vitals filed for this visit.  Examination:  General exam: Appears calm and comfortable  Respiratory system: Clear to auscultation. Respiratory effort normal. Cardiovascular system: S1 & S2 heard, RRR. No JVD, murmurs, rubs, gallops or clicks. No pedal edema. Gastrointestinal system: Abdomen is nondistended, soft and nontender. No organomegaly or masses felt. Normal bowel sounds heard. Central nervous system: Alert and oriented. No focal neurological deficits. Extremities: Symmetric 5 x 5 power. Skin: No rashes, lesions or ulcers.  Psychiatry: Judgement and insight appear normal. Mood & affect appropriate.   Data Reviewed: I have personally reviewed following labs and imaging studies  CBC: Recent Labs  Lab 05/20/21 1139 05/20/21 2019 05/21/21 0407  WBC 7.0 5.6 4.4  NEUTROABS 5.5  --   --   HGB 11.9* 11.2* 10.8*  HCT 32.3* 30.2* 30.7*  MCV 95.8 99.0 101.3*  PLT 242 227 390    Basic Metabolic Panel: Recent Labs  Lab 05/20/21 1139 05/20/21 1518 05/20/21 2019 05/21/21 0407 05/21/21 1438 05/21/21 2210 05/22/21 0523  NA 118* 121* 124* 128* 127* 126* 127*  K 4.5 3.9 4.1 3.7  --   --   --   CL 84* 89* 92* 98  --   --   --   CO2 24 26 23 24   --   --   --   GLUCOSE 102* 108* 93 91  --   --   --   BUN 9 10 8 7   --   --   --   CREATININE 0.70 0.64 0.57* 0.51*  --   --   --   CALCIUM 9.8 8.6* 8.9 8.9  --   --   --     GFR: Estimated Creatinine Clearance: 99.3 mL/min (A) (by C-G formula based on SCr of 0.51 mg/dL (L)). Liver Function Tests: Recent Labs  Lab 05/20/21 1139  AST 56*  ALT 40  ALKPHOS 88  BILITOT 0.6  PROT 7.8  ALBUMIN 4.0    No results for input(s): LIPASE, AMYLASE in the last 168 hours. No results for input(s): AMMONIA in the last 168 hours. Coagulation Profile: No results for input(s): INR, PROTIME in the last 168 hours. Cardiac Enzymes: No results for input(s): CKTOTAL, CKMB, CKMBINDEX, TROPONINI in the last 168 hours. BNP  (last 3 results) No results for input(s): PROBNP in the last 8760 hours. HbA1C: No results for input(s): HGBA1C in the last 72 hours. CBG: No results for input(s): GLUCAP in the last 168 hours. Lipid Profile: Recent Labs    05/20/21 2019  CHOL 123  HDL 53  LDLCALC 61  TRIG 47  CHOLHDL 2.3    Thyroid Function Tests: No results for input(s): TSH, T4TOTAL, FREET4, T3FREE, THYROIDAB in the last 72 hours. Anemia Panel: No results for input(s): VITAMINB12, FOLATE, FERRITIN, TIBC, IRON, RETICCTPCT  in the last 72 hours. Sepsis Labs: No results for input(s): PROCALCITON, LATICACIDVEN in the last 168 hours.  Recent Results (from the past 240 hour(s))  SARS CORONAVIRUS 2 (TAT 6-24 HRS) Nasopharyngeal Nasopharyngeal Swab     Status: None   Collection Time: 05/20/21  4:20 PM   Specimen: Nasopharyngeal Swab  Result Value Ref Range Status   SARS Coronavirus 2 NEGATIVE NEGATIVE Final    Comment: (NOTE) SARS-CoV-2 target nucleic acids are NOT DETECTED.  The SARS-CoV-2 RNA is generally detectable in upper and lower respiratory specimens during the acute phase of infection. Negative results do not preclude SARS-CoV-2 infection, do not rule out co-infections with other pathogens, and should not be used as the sole basis for treatment or other patient management decisions. Negative results must be combined with clinical observations, patient history, and epidemiological information. The expected result is Negative.  Fact Sheet for Patients: SugarRoll.be  Fact Sheet for Healthcare Providers: https://www.woods-mathews.com/  This test is not yet approved or cleared by the [place] FDA and  has been authorized for detection and/or diagnosis of SARS-CoV-2 by FDA under an Emergency Use Authorization (EUA). This EUA will remain  in effect (meaning this test can be used) for the duration of the COVID-19 declaration under Se ction 564(b)(1) of the  Act, 21 U.S.C. section 360bbb-3(b)(1), unless the authorization is terminated or revoked sooner.  Performed at [facility], [place] [address] [place], [place] [address]        Radiology Studies: No results found.  Scheduled Meds:  aspirin EC  81 mg Oral Daily   Chlorhexidine Gluconate Cloth  6 each Topical Daily   docusate sodium  100 mg Oral BID   enoxaparin (LOVENOX) injection  40 mg Subcutaneous Q24H   pantoprazole  40 mg Oral QAC breakfast   sodium chloride flush  10-40 mL Intracatheter Q12H   Continuous Infusions:  sodium chloride 75 mL/hr at 05/21/21 1617     LOS: 1 day   Time spent: 27 minutes   Darliss Cheney, MD [facility]  05/22/2021, 11:22 AM   How to contact the [facility] Attending or Consulting provider [place] or covering provider during after hours [place], for this patient?  Check the care team in [facility] and look for a) attending/consulting [facility] provider listed and b) the [facility] team listed. Page or secure chat [phone]. Log into [email or web address] and use [place]'s universal password to access. If you do not have the password, please contact the hospital operator. Locate the [facility] provider you are looking for under [facility] and page to a number that you can be directly reached. If you still have difficulty reaching the provider, please page the [facility] (Director on Call) for the Hospitalists listed on amion for assistance.

## 2021-05-23 ENCOUNTER — Telehealth: Payer: Self-pay | Admitting: Physician Assistant

## 2021-05-23 ENCOUNTER — Ambulatory Visit
Admission: RE | Admit: 2021-05-23 | Discharge: 2021-05-23 | Disposition: A | Discharge status: Home / Self Care | Payer: 59 | Source: Ambulatory Visit | Attending: Radiation Oncology | Admitting: Radiation Oncology

## 2021-05-23 ENCOUNTER — Encounter: Payer: Self-pay | Admitting: Internal Medicine

## 2021-05-23 DIAGNOSIS — Z510 Encounter for antineoplastic radiation therapy: Secondary | ICD-10-CM | POA: Insufficient documentation

## 2021-05-23 DIAGNOSIS — C3412 Malignant neoplasm of upper lobe, left bronchus or lung: Secondary | ICD-10-CM | POA: Insufficient documentation

## 2021-05-23 DIAGNOSIS — F17210 Nicotine dependence, cigarettes, uncomplicated: Secondary | ICD-10-CM | POA: Insufficient documentation

## 2021-05-23 DIAGNOSIS — C7931 Secondary malignant neoplasm of brain: Secondary | ICD-10-CM | POA: Insufficient documentation

## 2021-05-23 DIAGNOSIS — C7972 Secondary malignant neoplasm of left adrenal gland: Secondary | ICD-10-CM | POA: Insufficient documentation

## 2021-05-23 DIAGNOSIS — E222 Syndrome of inappropriate secretion of antidiuretic hormone: Secondary | ICD-10-CM

## 2021-05-23 LAB — BASIC METABOLIC PANEL
Anion gap: 6 (ref 5–15)
BUN: 6 mg/dL (ref 6–20)
CO2: 26 mmol/L (ref 22–32)
Calcium: 9.1 mg/dL (ref 8.9–10.3)
Chloride: 93 mmol/L — ABNORMAL LOW (ref 98–111)
Creatinine, Ser: 0.55 mg/dL — ABNORMAL LOW (ref 0.61–1.24)
GFR, Estimated: >60 mL/min (ref >60)
Glucose, Bld: 92 mg/dL (ref 70–99)
Potassium: 3.7 mmol/L (ref 3.5–5.1)
Sodium: 125 mmol/L — ABNORMAL LOW (ref 135–145)

## 2021-05-23 MED ORDER — HEPARIN SOD (PORK) LOCK FLUSH 100 UNIT/ML IV SOLN
500.0000 [IU] | INTRAVENOUS | Status: AC | PRN
  Administered 2021-05-23: 500 [IU]

## 2021-05-23 MED ORDER — SODIUM BICARBONATE 650 MG PO TABS: 650.0000 mg | ORAL_TABLET | Freq: Two times a day (BID) | ORAL | 0 refills | Status: DC

## 2021-05-23 NOTE — Discharge Summary (Signed)
Physician Discharge Summary  Richard Santos ZOX:[id number] DOB: 1961-09-11 DOA: 05/20/2021  PCP: [name], MD  Admit date: 05/20/2021 Discharge date: 05/23/2021 30 Day Unplanned Readmission Risk Score    Flowsheet Row ED to Hosp-Admission (Current) from 05/20/2021 in [facility]  30 Day Unplanned Readmission Risk Score (%) 12.35 Filed at 05/23/2021 0801       This score is the patient's risk of an unplanned readmission within 30 days of being discharged (0 -100%). The score is based on dignosis, age, lab data, medications, orders, and past utilization.   Low:  0-14.9   Medium: 15-21.9   High: 22-29.9   Extreme: 30 and above           Admitted From: Home Disposition: Home  Recommendations for Outpatient Follow-up:  Follow up with PCP in 3 to 4 days Please obtain BMP/CBC in 3 to 4 days when he sees his PCP Please follow up with your PCP on the following pending results: Unresulted Labs (From admission, onward)    None         Home Health: None Equipment/Devices: None  Discharge Condition: Stable CODE STATUS: Full code Diet recommendation: Cardiac  Subjective: Seen and examined.  Continues to feel well without having any symptoms.  Eager to go home and really wants to go home today.  Brief/Interim Summary: Richard Santos is a 60 y.o. 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  Patient is getting immunotherapy and radiation therapy, recent blood work shows sodium 118.  Patient had no symptoms at all.  He was hemodynamically stable upon arrival to the emergency department.  Admitted under hospital service for  acute severe hyponatremia.  Initially he was presumed to have hypovolemic hyponatremia so he was started on normal saline.  His sodium improved to 128 within 24 hours.  Normal saline was reduced in frequency.  Further lab work was done which included urine sodium, urine osmolarity and serum osmolarity and that indicated possible SIADH.  By the time labs were back, his sodium had dropped to 123 last evening.  Once it was indicated that he possibly has SIADH, IV fluids were stopped and he was started on sodium bicarb tablets last night.  His sodium improved from 123 last night to 125 this morning.  SIADH could be due to the medications he is taking which includes opiates/tramadol as well as PPI and his known lung cancer.  Since patient has no symptoms, and he is very insistent on going home, he is safe to go home.  I have discontinued his PPI, unable to discontinue his tramadol since that is the only pain medications he takes however I have advised him to take as little as he can.  He is being discharged on sodium tablet.  I hope that his sodium will improve and will be back to normal in 2 to 3 days.  I have strongly advised him to see his PCP in 3 days so they can repeat his BMP and sodium checks.  He verbalized understanding.  He is being discharged in stable condition.  Discharge Diagnoses:  Principal Problem:   Hyponatremia Active Problems:   Hyperlipidemia   ERECTILE DYSFUNCTION   Tobacco  abuse   GERD   Alcohol abuse   Chronic low back pain   Malignant neoplasm of prostate (HCC)   HTN (hypertension)   History of prostate cancer   Small cell lung cancer, left upper lobe ([place])   Metastatic cancer to brain [facility])   Acute hyponatremia   SIADH (syndrome of inappropriate ADH production) ([place])    Discharge Instructions   Allergies as of 05/23/2021       Reactions   Bee Venom Swelling   Elemental Sulfur Other (See Comments)   Acute renal failure   Other Other (See Comments)   Chemo medicine-  patient not sure about name - swelling & rashes   Sulfa Antibiotics    Namenda [memantine] Nausea Only   Nausea and fatigue        Medication List     STOP taking these medications    busPIRone 10 MG tablet Commonly known as: BUSPAR   lidocaine-prilocaine cream Commonly known as: EMLA   pantoprazole 40 MG tablet Commonly known as: PROTONIX   triamcinolone cream 0.5 % Commonly known as: KENALOG       TAKE these medications    acetaminophen 325 MG tablet Commonly known as: TYLENOL Take 2 tablets (650 mg total) by mouth every 6 (six) hours as needed for mild pain (or Fever >/= 101).   aspirin EC 81 MG tablet Take 81 mg by mouth daily. Swallow whole.   Breztri Aerosphere 160-9-4.8 MCG/ACT Aero Generic drug: Budeson-Glycopyrrol-Formoterol INHALE 2 PUFFS INTO THE LUNGS IN THE MORNING AND AT BEDTIME.   famotidine 20 MG tablet Commonly known as: Pepcid Take 1 tablet (20 mg total) by mouth 2 (two) times daily.   multivitamin capsule Take 1 capsule by mouth daily.   ondansetron 8 MG disintegrating tablet Commonly known as: ZOFRAN-ODT Take 1 tablet (8 mg total) by mouth every 8 (eight) hours as needed for nausea or vomiting.   prochlorperazine 10 MG tablet Commonly known as: COMPAZINE Take 1 tablet (10 mg total) by mouth every 6 (six) hours as needed. What changed: reasons to take this   sodium bicarbonate 650 MG tablet Take 1 tablet (650 mg total) by mouth 2 (two) times daily for 7 days.   traMADol 50 MG tablet Commonly known as: ULTRAM TAKE 1/2 TABLET BY MOUTH TWICE A DAY AS NEEDED FOR PAIN What changed:  how much to take how to take this when to take this additional instructions        Follow-up Information     [name], MD Follow up in 3 day(s).   Specialties: Internal Medicine, Radiology Contact information: [place] [address] [phone]                Allergies  Allergen Reactions   Bee Venom Swelling    Elemental Sulfur Other (See Comments)    Acute renal failure   Other Other (See Comments)    Chemo medicine- patient not sure about name - swelling & rashes   Sulfa Antibiotics    Namenda [Memantine] Nausea Only    Nausea and fatigue    Consultations: None   Procedures/Studies: CT CHEST W CONTRAST  Result Date: 05/22/2021 CLINICAL DATA:  60 year old male with small cell lung cancer restaging. EXAM: CT CHEST, ABDOMEN, AND PELVIS WITH CONTRAST TECHNIQUE: Multidetector CT imaging of the chest, abdomen and pelvis was performed following the standard protocol during bolus administration of intravenous contrast. CONTRAST:  138mL OMNIPAQUE IOHEXOL 300 MG/ML  SOLN COMPARISON:  CT  dated 04/18/2021. FINDINGS: CT CHEST FINDINGS Cardiovascular: There is no cardiomegaly or pericardial effusion. There is coronary vascular calcification. Right-sided Port-A-Cath with tip at the cavoatrial junction. The thoracic aorta is unremarkable. The origins of the great vessels of the aortic arch appear patent. The central pulmonary arteries are unremarkable. Mediastinum/Nodes: No hilar or mediastinal adenopathy. The esophagus and the thyroid gland are grossly unremarkable. No mediastinal fluid collection. Lungs/Pleura: Post radiation changes of the left lung and left perihilar region with traction bronchiectasis and architectural distortion. Slight interval decrease in the size of the cavitary lesion in the lingula measuring up to 3.3 cm. There is a 7 x 8 mm nodular density in the medial left upper lobe (45/4) which has increased in size since the prior CT (previously measuring approximately 4 mm). A lobulated and somewhat cavitary lesion in the left upper lobe measures 12 x 18 mm similar to prior CT. Similar appearance of subpleural thickening and scarring along the posterior aspect of the superior segment of the left lower lobe. Biapical subpleural scarring. There is a 3 mm subpleural nodule in the anterior right upper  lobe (50/4), increased in size since the prior CT. There is no pleural effusion pneumothorax. The central airways are patent. Musculoskeletal: No axillary adenopathy. Similar appearance of a 4 mm nodule in the subcutaneous soft tissues of the right lateral chest wall (34/2). A 1.2 x 1.3 cm nodular density in the subcutaneous soft tissues of the left anterior chest wall (34/2) has increased in size (previously measuring approximately 6 x 8 mm). There is degenerative changes of the spine. Stable 5 mm sclerotic lesion in the T8, indeterminate. No acute osseous pathology. CT ABDOMEN PELVIS FINDINGS No intra-abdominal free air or free fluid. Hepatobiliary: No focal liver abnormality is seen. No gallstones, gallbladder wall thickening, or biliary dilatation. Pancreas: Unremarkable. No pancreatic ductal dilatation or surrounding inflammatory changes. Spleen: Normal in size without focal abnormality. Adrenals/Urinary Tract: The right adrenal gland is unremarkable. Lobulated mass involving the left adrenal gland measures 4.2 x 2.8 cm (61/2), not significantly changed. There is a 2 cm right renal interpolar cyst. There is no hydronephrosis on either side. There is symmetric enhancement and excretion of contrast by both kidneys. The visualized ureters appear unremarkable. There is irregular and thickened appearance of the posterior bladder wall which is not evaluated on this CT but may be related to enlargement of the prostate gland and changes of TURP. Cystoscopy may provide better evaluation. Stomach/Bowel: There is sigmoid diverticulosis without active inflammatory changes. There is no bowel obstruction or active inflammation. The appendix is normal. Vascular/Lymphatic: Advanced aortoiliac atherosclerotic disease. The IVC is unremarkable. No portal venous gas. There is no adenopathy. Reproductive: Enlarged prostate gland with brachytherapy seeds . Other: None Musculoskeletal: Osteopenia with degenerative changes of the  spine. No acute osseous pathology. IMPRESSION: 1. Post radiation changes of the left lung and left perihilar region with traction bronchiectasis and architectural distortion. Slight interval decrease in the size of the cavitary lesion in the lingula. 2. Interval increase in the size of pulmonary nodule in the medial left upper lobe. Additionally a subcutaneous nodule in the left anterior chest wall has increased since the prior CT. 3. No significant interval change in the size of the left adrenal mass. 4. Sigmoid diverticulosis. No bowel obstruction. Normal appendix. 5. Aortic Atherosclerosis (ICD10-I70.0). Electronically Signed   By: Anner Crete M.D.   On: 05/22/2021 19:20   MR Brain W Wo Contrast  Result Date: 05/16/2021 CLINICAL DATA:  Metastatic lung cancer  post radiation. EXAM: MRI HEAD WITHOUT AND WITH CONTRAST TECHNIQUE: Multiplanar, multiecho pulse sequences of the brain and surrounding structures were obtained without and with intravenous contrast. CONTRAST:  24mL GADAVIST GADOBUTROL 1 MMOL/ML IV SOLN COMPARISON:  MRI head 02/13/2021 FINDINGS: Brain: New lesions: 9 mm necrotic lesion in the left temporoparietal lobe with surrounding edema. Axial image 134 4 mm necrotic lesion right temporal lobe with minimal edema. Axial image 172 5 mm enhancing lesion right lateral basal ganglia/external capsule. Axial image 190 3.5 mm lesion left frontal lobe axial image   195 3 mm lesion left head of caudate axial image 200. This image shows restricted diffusion. Stable lesions: Treated lesion left cerebellum without recurrence. Small amount of associated susceptibility. Ventricle size normal. Vascular: Normal arterial flow voids. Small developmental venous anomaly right medial parietal lobe unchanged. Skull and upper cervical spine: No focal skeletal metastasis identified. Sinuses/Orbits: Mild mucosal edema paranasal sinuses. Bilateral mastoid effusion similar to the prior MRI. Negative orbit Other: None  IMPRESSION: Five new enhancing lesions compatible with metastatic disease to the brain Treated lesion left inferior cerebellum stable Electronically Signed   By: Franchot Gallo M.D.   On: 05/16/2021 12:47   CT ABDOMEN PELVIS W CONTRAST  Result Date: 05/22/2021 CLINICAL DATA:  60 year old male with small cell lung cancer restaging. EXAM: CT CHEST, ABDOMEN, AND PELVIS WITH CONTRAST TECHNIQUE: Multidetector CT imaging of the chest, abdomen and pelvis was performed following the standard protocol during bolus administration of intravenous contrast. CONTRAST:  149mL OMNIPAQUE IOHEXOL 300 MG/ML  SOLN COMPARISON:  CT dated 04/18/2021. FINDINGS: CT CHEST FINDINGS Cardiovascular: There is no cardiomegaly or pericardial effusion. There is coronary vascular calcification. Right-sided Port-A-Cath with tip at the cavoatrial junction. The thoracic aorta is unremarkable. The origins of the great vessels of the aortic arch appear patent. The central pulmonary arteries are unremarkable. Mediastinum/Nodes: No hilar or mediastinal adenopathy. The esophagus and the thyroid gland are grossly unremarkable. No mediastinal fluid collection. Lungs/Pleura: Post radiation changes of the left lung and left perihilar region with traction bronchiectasis and architectural distortion. Slight interval decrease in the size of the cavitary lesion in the lingula measuring up to 3.3 cm. There is a 7 x 8 mm nodular density in the medial left upper lobe (45/4) which has increased in size since the prior CT (previously measuring approximately 4 mm). A lobulated and somewhat cavitary lesion in the left upper lobe measures 12 x 18 mm similar to prior CT. Similar appearance of subpleural thickening and scarring along the posterior aspect of the superior segment of the left lower lobe. Biapical subpleural scarring. There is a 3 mm subpleural nodule in the anterior right upper lobe (50/4), increased in size since the prior CT. There is no pleural effusion  pneumothorax. The central airways are patent. Musculoskeletal: No axillary adenopathy. Similar appearance of a 4 mm nodule in the subcutaneous soft tissues of the right lateral chest wall (34/2). A 1.2 x 1.3 cm nodular density in the subcutaneous soft tissues of the left anterior chest wall (34/2) has increased in size (previously measuring approximately 6 x 8 mm). There is degenerative changes of the spine. Stable 5 mm sclerotic lesion in the T8, indeterminate. No acute osseous pathology. CT ABDOMEN PELVIS FINDINGS No intra-abdominal free air or free fluid. Hepatobiliary: No focal liver abnormality is seen. No gallstones, gallbladder wall thickening, or biliary dilatation. Pancreas: Unremarkable. No pancreatic ductal dilatation or surrounding inflammatory changes. Spleen: Normal in size without focal abnormality. Adrenals/Urinary Tract: The right adrenal gland is  unremarkable. Lobulated mass involving the left adrenal gland measures 4.2 x 2.8 cm (61/2), not significantly changed. There is a 2 cm right renal interpolar cyst. There is no hydronephrosis on either side. There is symmetric enhancement and excretion of contrast by both kidneys. The visualized ureters appear unremarkable. There is irregular and thickened appearance of the posterior bladder wall which is not evaluated on this CT but may be related to enlargement of the prostate gland and changes of TURP. Cystoscopy may provide better evaluation. Stomach/Bowel: There is sigmoid diverticulosis without active inflammatory changes. There is no bowel obstruction or active inflammation. The appendix is normal. Vascular/Lymphatic: Advanced aortoiliac atherosclerotic disease. The IVC is unremarkable. No portal venous gas. There is no adenopathy. Reproductive: Enlarged prostate gland with brachytherapy seeds . Other: None Musculoskeletal: Osteopenia with degenerative changes of the spine. No acute osseous pathology. IMPRESSION: 1. Post radiation changes of the left  lung and left perihilar region with traction bronchiectasis and architectural distortion. Slight interval decrease in the size of the cavitary lesion in the lingula. 2. Interval increase in the size of pulmonary nodule in the medial left upper lobe. Additionally a subcutaneous nodule in the left anterior chest wall has increased since the prior CT. 3. No significant interval change in the size of the left adrenal mass. 4. Sigmoid diverticulosis. No bowel obstruction. Normal appendix. 5. Aortic Atherosclerosis (ICD10-I70.0). Electronically Signed   By: Anner Crete M.D.   On: 05/22/2021 19:20     Discharge Exam: Vitals:   05/22/21 2010 05/23/21 0547  BP: 120/74 121/75  Pulse: 76 73  Resp: 18 17  Temp: 98.2 F (36.8 C) 97.9 F (36.6 C)  SpO2: 99% 98%   Vitals:   05/22/21 0621 05/22/21 1321 05/22/21 2010 05/23/21 0547  BP: 126/84 135/78 120/74 121/75  Pulse: 79 77 76 73  Resp: 18 18 18 17   Temp: 98.1 F (36.7 C) 97.6 F (36.4 C) 98.2 F (36.8 C) 97.9 F (36.6 C)  TempSrc:      SpO2: 98% 100% 99% 98%    General: Pt is alert, awake, not in acute distress Cardiovascular: RRR, S1/S2 +, no rubs, no gallops Respiratory: CTA bilaterally, no wheezing, no rhonchi Abdominal: Soft, NT, ND, bowel sounds + Extremities: no edema, no cyanosis    The results of significant diagnostics from this hospitalization (including imaging, microbiology, ancillary and laboratory) are listed below for reference.     Microbiology: Recent Results (from the past 240 hour(s))  SARS CORONAVIRUS 2 (TAT 6-24 HRS) Nasopharyngeal Nasopharyngeal Swab     Status: None   Collection Time: 05/20/21  4:20 PM   Specimen: Nasopharyngeal Swab  Result Value Ref Range Status   SARS Coronavirus 2 NEGATIVE NEGATIVE Final    Comment: (NOTE) SARS-CoV-2 target nucleic acids are NOT DETECTED.  The SARS-CoV-2 RNA is generally detectable in upper and lower respiratory specimens during the acute phase of infection.  Negative results do not preclude SARS-CoV-2 infection, do not rule out co-infections with other pathogens, and should not be used as the sole basis for treatment or other patient management decisions. Negative results must be combined with clinical observations, patient history, and epidemiological information. The expected result is Negative.  Fact Sheet for Patients: SugarRoll.be  Fact Sheet for Healthcare Providers: https://www.woods-mathews.com/  This test is not yet approved or cleared by the [place] FDA and  has been authorized for detection and/or diagnosis of SARS-CoV-2 by FDA under an Emergency Use Authorization (EUA). This EUA will remain  in effect (meaning  this test can be used) for the duration of the COVID-19 declaration under Se ction 564(b)(1) of the Act, 21 U.S.C. section 360bbb-3(b)(1), unless the authorization is terminated or revoked sooner.  Performed at [facility], [place] [address] [place], [place] [address]      Labs: BNP (last 3 results) No results for input(s): BNP in the last 8760 hours. Basic Metabolic Panel: Recent Labs  Lab 05/20/21 1139 05/20/21 1518 05/20/21 2019 05/21/21 0407 05/21/21 1438 05/21/21 2210 05/22/21 0523 05/22/21 1258 05/22/21 1807 05/23/21 0346  NA 118* 121* 124* 128*   < > 126* 127* 128* 123* 125*  K 4.5 3.9 4.1 3.7  --   --   --   --   --  3.7  CL 84* 89* 92* 98  --   --   --   --   --  93*  CO2 24 26 23 24   --   --   --   --   --  26  GLUCOSE 102* 108* 93 91  --   --   --   --   --  92  BUN 9 10 8 7   --   --   --   --   --  6  CREATININE 0.70 0.64 0.57* 0.51*  --   --   --   --   --  0.55*  CALCIUM 9.8 8.6* 8.9 8.9  --   --   --   --   --  9.1   < > = values in this interval not displayed.   Liver Function Tests: Recent Labs  Lab 05/20/21 1139  AST 56*  ALT 40  ALKPHOS 88  BILITOT 0.6  PROT 7.8  ALBUMIN 4.0   No results for input(s): LIPASE, AMYLASE in  the last 168 hours. No results for input(s): AMMONIA in the last 168 hours. CBC: Recent Labs  Lab 05/20/21 1139 05/20/21 2019 05/21/21 0407  WBC 7.0 5.6 4.4  NEUTROABS 5.5  --   --   HGB 11.9* 11.2* 10.8*  HCT 32.3* 30.2* 30.7*  MCV 95.8 99.0 101.3*  PLT 242 227 242   Cardiac Enzymes: No results for input(s): CKTOTAL, CKMB, CKMBINDEX, TROPONINI in the last 168 hours. BNP: Invalid input(s): POCBNP CBG: No results for input(s): GLUCAP in the last 168 hours. D-Dimer No results for input(s): DDIMER in the last 72 hours. Hgb A1c No results for input(s): HGBA1C in the last 72 hours. Lipid Profile Recent Labs    05/20/21 2019  CHOL 123  HDL 53  LDLCALC 61  TRIG 47  CHOLHDL 2.3   Thyroid function studies No results for input(s): TSH, T4TOTAL, T3FREE, THYROIDAB in the last 72 hours.  Invalid input(s): FREET3 Anemia work up No results for input(s): VITAMINB12, FOLATE, FERRITIN, TIBC, IRON, RETICCTPCT in the last 72 hours. Urinalysis    Component Value Date/Time   COLORURINE STRAW (A) 05/20/2021 1530   APPEARANCEUR CLEAR 05/20/2021 1530   LABSPEC 1.005 05/20/2021 1530   PHURINE 7.0 05/20/2021 1530   GLUCOSEU 50 (A) 05/20/2021 1530   GLUCOSEU NEGATIVE 12/12/2019 1506   HGBUR NEGATIVE 05/20/2021 1530   BILIRUBINUR NEGATIVE 05/20/2021 1530   KETONESUR NEGATIVE 05/20/2021 1530   PROTEINUR NEGATIVE 05/20/2021 1530   UROBILINOGEN 0.2 12/12/2019 1506   NITRITE NEGATIVE 05/20/2021 1530   LEUKOCYTESUR NEGATIVE 05/20/2021 1530   Sepsis Labs Invalid input(s): PROCALCITONIN,  WBC,  LACTICIDVEN Microbiology Recent Results (from the past 240 hour(s))  SARS CORONAVIRUS 2 (TAT  6-24 HRS) Nasopharyngeal Nasopharyngeal Swab     Status: None   Collection Time: 05/20/21  4:20 PM   Specimen: Nasopharyngeal Swab  Result Value Ref Range Status   SARS Coronavirus 2 NEGATIVE NEGATIVE Final    Comment: (NOTE) SARS-CoV-2 target nucleic acids are NOT DETECTED.  The SARS-CoV-2 RNA is  generally detectable in upper and lower respiratory specimens during the acute phase of infection. Negative results do not preclude SARS-CoV-2 infection, do not rule out co-infections with other pathogens, and should not be used as the sole basis for treatment or other patient management decisions. Negative results must be combined with clinical observations, patient history, and epidemiological information. The expected result is Negative.  Fact Sheet for Patients: SugarRoll.be  Fact Sheet for Healthcare Providers: https://www.woods-mathews.com/  This test is not yet approved or cleared by the [place] FDA and  has been authorized for detection and/or diagnosis of SARS-CoV-2 by FDA under an Emergency Use Authorization (EUA). This EUA will remain  in effect (meaning this test can be used) for the duration of the COVID-19 declaration under Se ction 564(b)(1) of the Act, 21 U.S.C. section 360bbb-3(b)(1), unless the authorization is terminated or revoked sooner.  Performed at [facility], [place] [address], [place], [place] [address]      Time coordinating discharge: Over 30 minutes  SIGNED:   Darliss Cheney, MD  [facility] 05/23/2021, 11:03 AM  If 7PM-7AM, please contact night-coverage [email or web address]

## 2021-05-23 NOTE — Telephone Encounter (Signed)
Scheduled per los. Called and left msg.

## 2021-05-23 NOTE — Plan of Care (Signed)
  Problem: Education: Goal: Knowledge of General Education information will improve Description Including pain rating scale, medication(s)/side effects and non-pharmacologic comfort measures Outcome: Progressing   Problem: Health Behavior/Discharge Planning: Goal: Ability to manage health-related needs will improve Outcome: Progressing   

## 2021-05-27 ENCOUNTER — Ambulatory Visit
Admission: RE | Admit: 2021-05-27 | Discharge: 2021-05-27 | Disposition: A | Discharge status: Home / Self Care | Payer: 59 | Source: Ambulatory Visit | Attending: Radiation Oncology | Admitting: Radiation Oncology

## 2021-05-27 ENCOUNTER — Other Ambulatory Visit: Payer: Self-pay

## 2021-05-27 DIAGNOSIS — Z5112 Encounter for antineoplastic immunotherapy: Secondary | ICD-10-CM | POA: Diagnosis not present

## 2021-05-27 DIAGNOSIS — Z452 Encounter for adjustment and management of vascular access device: Secondary | ICD-10-CM | POA: Diagnosis not present

## 2021-05-27 DIAGNOSIS — C7931 Secondary malignant neoplasm of brain: Secondary | ICD-10-CM | POA: Diagnosis present

## 2021-05-27 DIAGNOSIS — I10 Essential (primary) hypertension: Secondary | ICD-10-CM | POA: Diagnosis not present

## 2021-05-27 DIAGNOSIS — Z7982 Long term (current) use of aspirin: Secondary | ICD-10-CM | POA: Diagnosis not present

## 2021-05-27 DIAGNOSIS — F17210 Nicotine dependence, cigarettes, uncomplicated: Secondary | ICD-10-CM | POA: Diagnosis not present

## 2021-05-27 DIAGNOSIS — Z510 Encounter for antineoplastic radiation therapy: Secondary | ICD-10-CM | POA: Diagnosis not present

## 2021-05-27 DIAGNOSIS — E222 Syndrome of inappropriate secretion of antidiuretic hormone: Secondary | ICD-10-CM | POA: Diagnosis not present

## 2021-05-27 DIAGNOSIS — Z79899 Other long term (current) drug therapy: Secondary | ICD-10-CM | POA: Diagnosis not present

## 2021-05-27 DIAGNOSIS — C3412 Malignant neoplasm of upper lobe, left bronchus or lung: Secondary | ICD-10-CM | POA: Diagnosis present

## 2021-05-27 DIAGNOSIS — Z8546 Personal history of malignant neoplasm of prostate: Secondary | ICD-10-CM | POA: Diagnosis not present

## 2021-05-27 DIAGNOSIS — C7970 Secondary malignant neoplasm of unspecified adrenal gland: Secondary | ICD-10-CM | POA: Diagnosis not present

## 2021-05-27 IMAGING — US US RENAL
1 series · 14 of 25 positions shown · non-contrast
Comparison: None.

CLINICAL DATA: Renal insufficiency.

EXAM:
RENAL / URINARY TRACT ULTRASOUND COMPLETE

[Series 1: us renal · 14 of 34 slices shown]
[im 1/34]
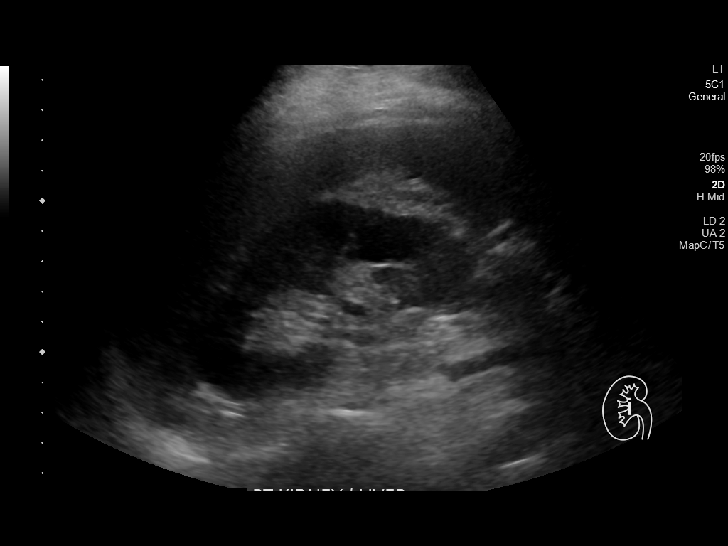
[im 3/34]
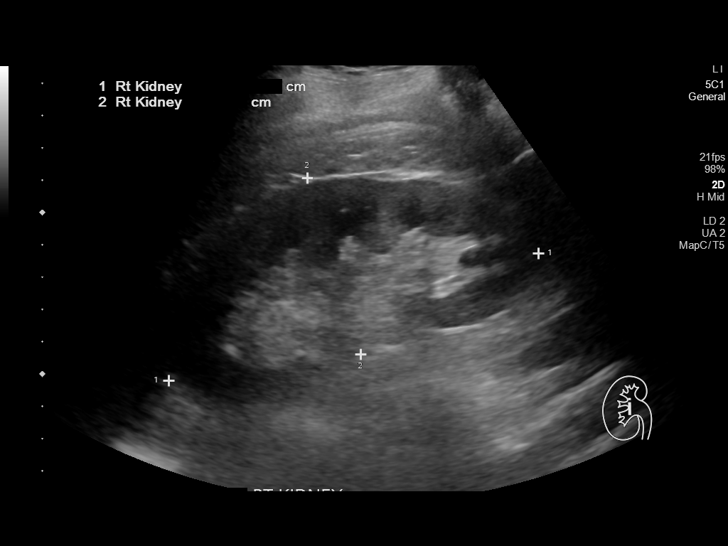
[im 6/34]
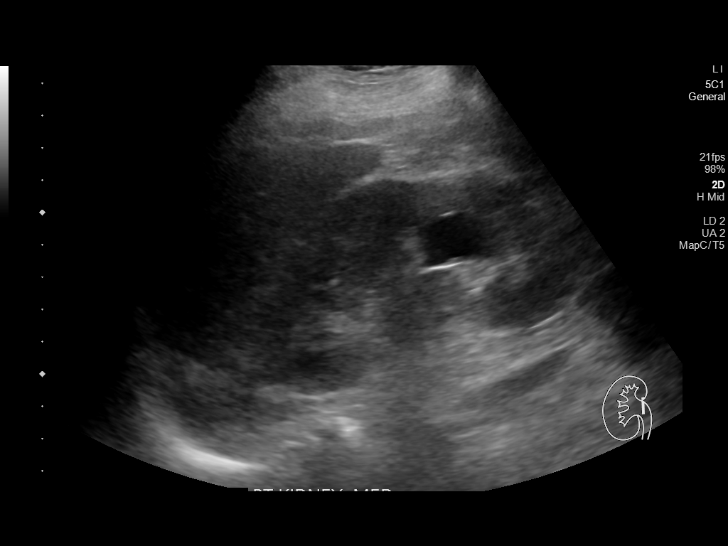
[im 9/34]
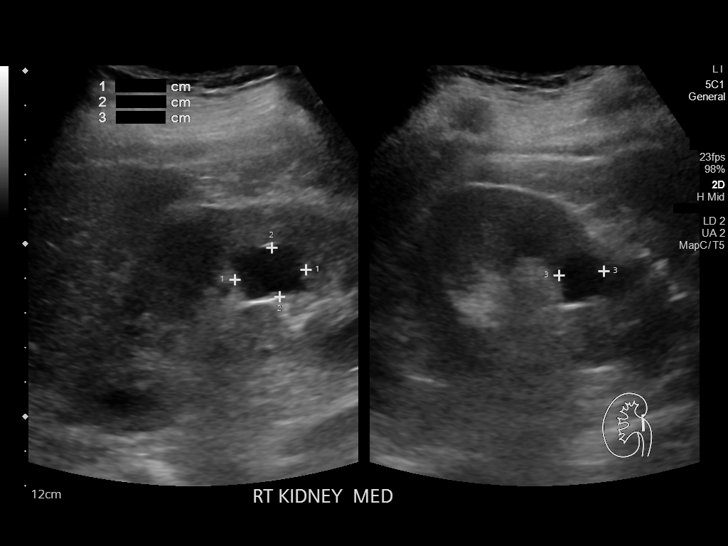
[im 12/34]
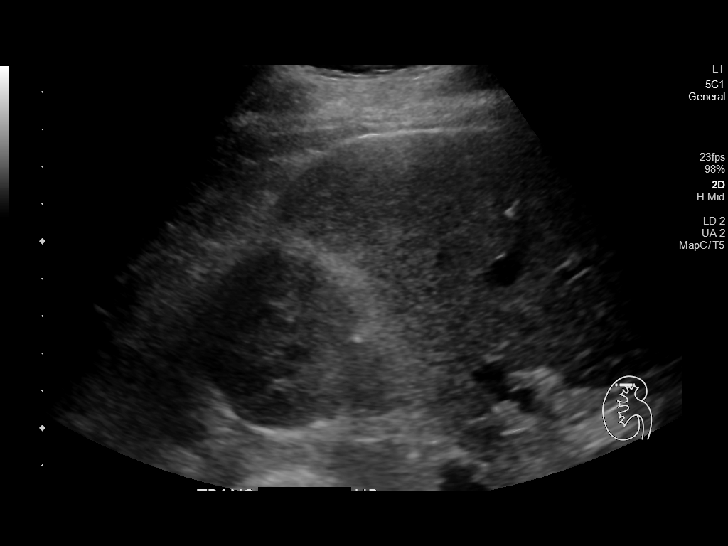
[im 13/34]
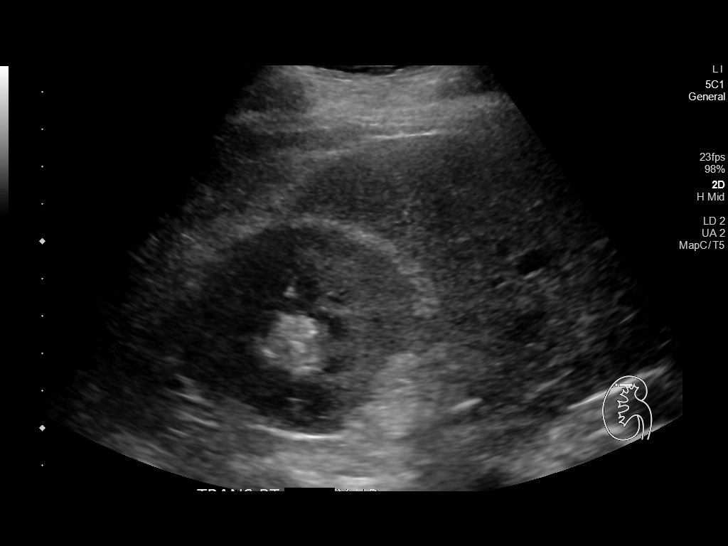
[im 16/34]
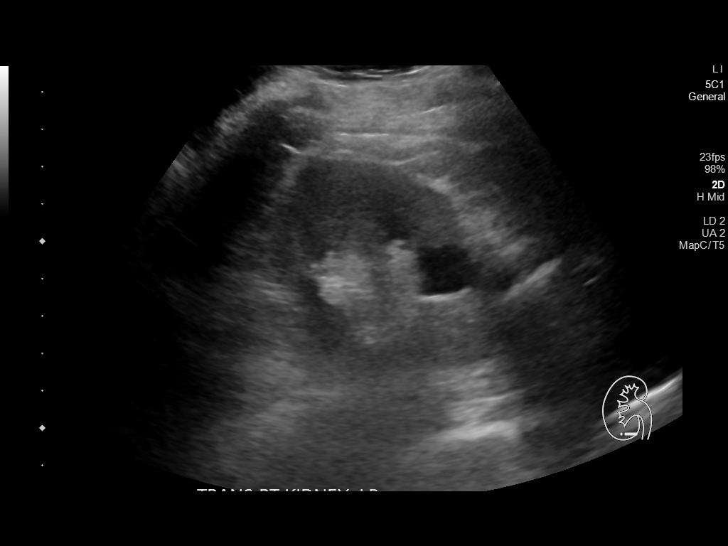
[im 18/34]
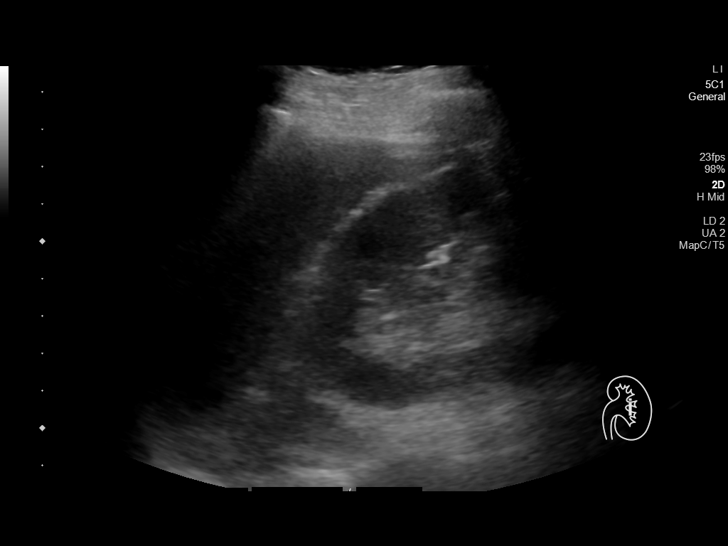
[im 21/34]
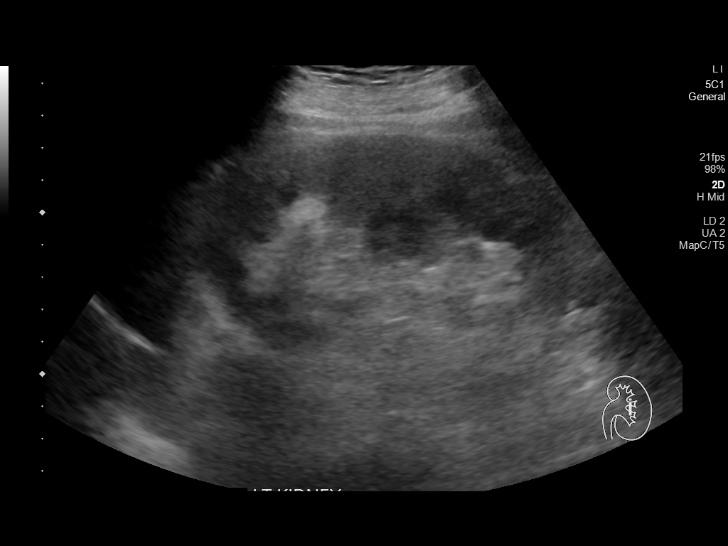
[im 23/34]
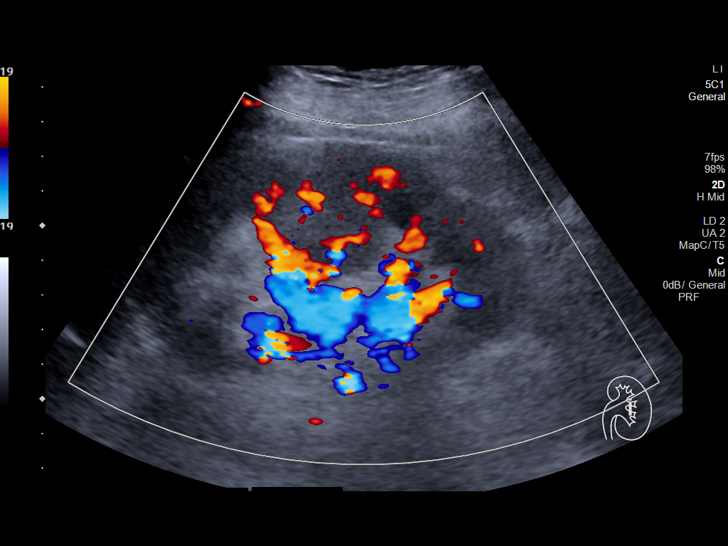
[im 25/34]
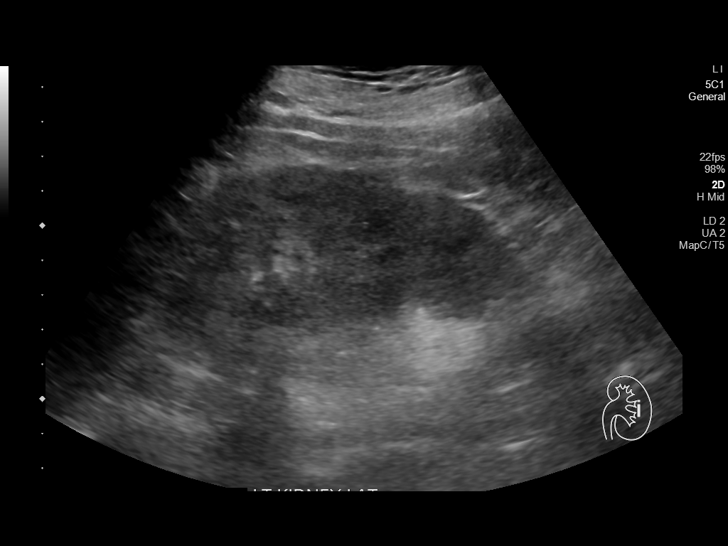
[im 28/34]
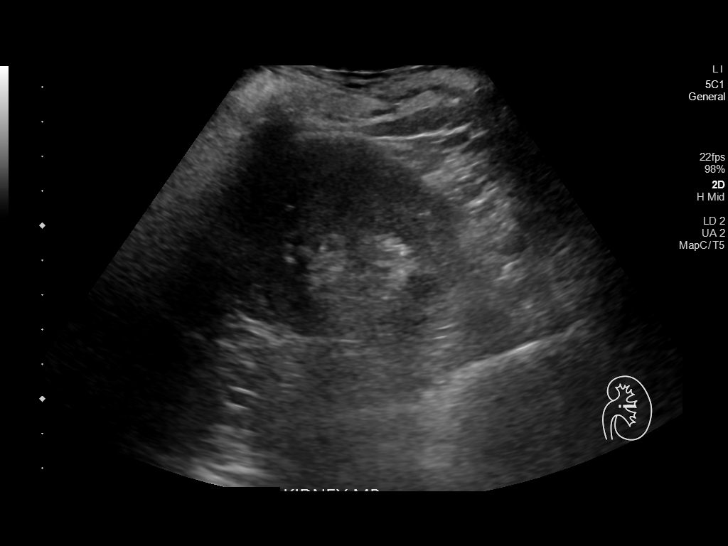
[im 31/34]
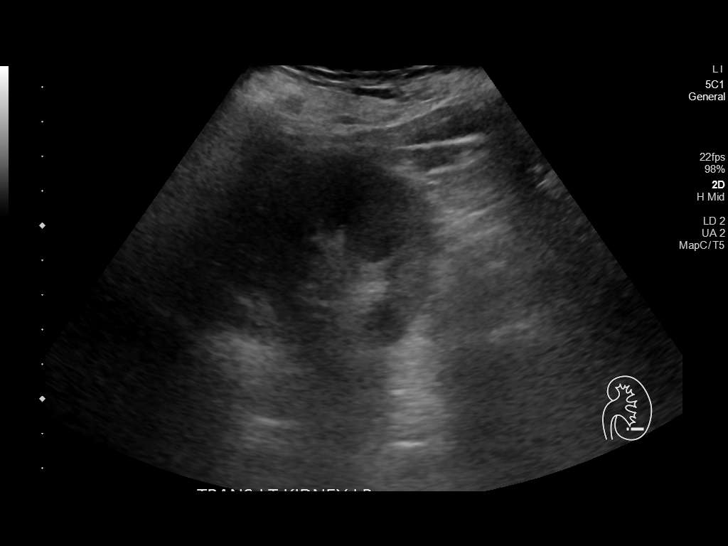
[im 34/34]
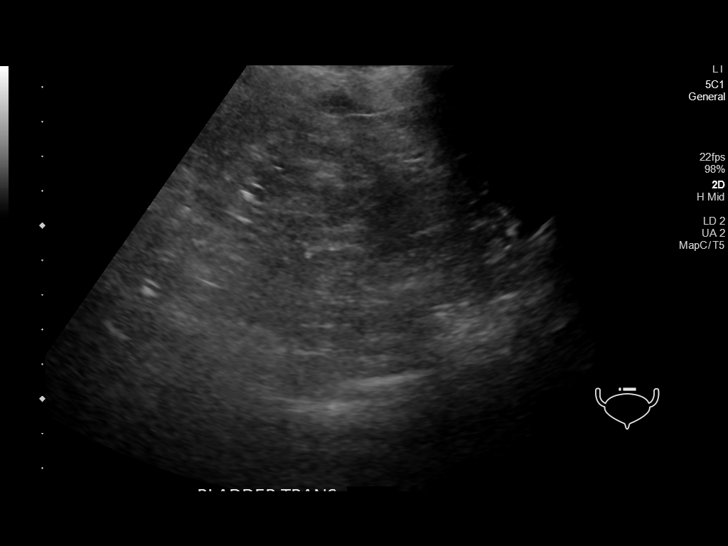

[14 of 25 positions shown; findings below may reference images not displayed]

FINDINGS: Right Kidney:

Renal measurements: 12.1 x 5.8 x 5.7 cm = volume: 210 mL. 2.1 cm
simple cyst is seen in middle pole. Echogenicity within normal
limits. No mass or hydronephrosis visualized.

Left Kidney:

Renal measurements: 12.4 x 6.5 x 6.3 cm = volume: 267 mL.
Echogenicity within normal limits. No mass or hydronephrosis
visualized.

Bladder:

Completely decompressed and therefore not well visualized.

Other:

None.
IMPRESSION: No significant renal abnormality is noted.

## 2021-05-28 ENCOUNTER — Encounter: Payer: Self-pay | Admitting: Radiation Oncology

## 2021-05-28 ENCOUNTER — Ambulatory Visit
Admission: RE | Admit: 2021-05-28 | Discharge: 2021-05-28 | Disposition: A | Discharge status: Home / Self Care | Payer: 59 | Source: Ambulatory Visit | Attending: Radiation Oncology | Admitting: Radiation Oncology

## 2021-05-28 ENCOUNTER — Other Ambulatory Visit: Payer: Self-pay

## 2021-05-28 DIAGNOSIS — Z5112 Encounter for antineoplastic immunotherapy: Secondary | ICD-10-CM | POA: Diagnosis not present

## 2021-05-28 DIAGNOSIS — C61 Malignant neoplasm of prostate: Secondary | ICD-10-CM

## 2021-05-28 NOTE — Progress Notes (Signed)
[facility] OFFICE PROGRESS NOTE  [name], MD [place] [address]  DIAGNOSIS:  Extensive stage (T2b, N2, M1c) small cell lung cancer presented with left upper lobe pulmonary nodule in addition to left hilar mass with mediastinal invasion and solitary brain metastasis diagnosed in May 2021  PRIOR THERAPY:  1) Palliative radiation to the left hilar and mediastinal lymphadenopathy under the care of Dr. Lisbeth Renshaw. 2) Cranial irradiation from 08/26/20-09/06/20 under the care of Dr. Lisbeth Renshaw  CURRENT THERAPY:  1)  Palliative systemic chemotherapy with carboplatin for AUC of 5 on day 1, etoposide 100 mg/M2 on days 1, 2 and 3 as well as Imfinzi 1500 mg IV every 3 weeks with the chemotherapy.  First dose of chemotherapy May 06, 2020.  The patient will receive treatment with Cosela on the days of his chemotherapy.  Status post 13 cycles.  Starting from cycle #5 he started maintenance immunotherapy with Imfinzi 1500 mg IV every 4 weeks.   2) Radiation to the enlarging left adrenal nodules under the care of Dr. Lisbeth Renshaw. Last treatment expected on 06/05/21  INTERVAL HISTORY: Richard Santos 60 y.o. male returns to the clinic today for a follow-up visit.  At the patient's last appointment on 05/20/2021, the patient was seen in clinic for a routine follow-up visit when his labs noted critically low hyponatremia with a sodium of 118. The patient was then sent to the emergency room.  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  Upon discharge he was instructed to take sodium bicarb tablets twice a day for 7 days. The patient was recently found to have new metastatic disease to the brain for which he is receiving SRS on 05/30/2021.  The patient also is receiving palliative radiation to the left enlarging adrenal gland nodules under the care of Dr. Lisbeth Renshaw.  The last treatment is expected on 06/05/2021.  The patient had a restaging  CT scan of the chest, abdomen, and pelvis while admitted to the hospital which showed postradiation changes in the left lung left hilar region and a slight interval decrease in the size of the cavitary lesion in the lingula but there was a slight interval increase in size of pulmonary nodule in the medial left upper lobe and a subcutaneous nodule in the left anterior chest since prior imaging.  The left adrenal gland lesion showed no significant change.   Since being discharged, the patient is feeling overall well. He denies any seizure, altered mental status, or confusion. He has been taking his sodium tablets as prescribed. He endorses the same forgetfulness he has been experiencing for the last 4-5 months since having brain radiation. Denies any headaches, changes in vision, weakness, nausea, diarrhea, or constipation. He denies any fever, chills or night sweats.  He reports while in the hospital he was not eating or sleeping well at all due to the food and environment. His appetite has returned and he states he has started to gain the few pounds he lost while admitted. He does report 2 episodes of dysphagia while eating solids. He states this has happened before, and he is following with GI and recently had a nonobstructive Schatzki ring dilated. He has continued to have some sleep disturbance since leaving the hospital stating he cannot return to sleep after having to get up and use the bathroom. He states he has been having a mild cough and he denies having a cough in the past.  He states it is productive and his coughing is also at times related to his eating. He denies any chest pain or hemoptysis. He reports stable dyspnea on exertion. He denies any rashes or skin changes.  The patient is here today for evaluation, repeat blood work, and a more detailed discussion about his current condition and recommended treatment options.   MEDICAL HISTORY: Past Medical History:  Diagnosis Date   Allergic  rhinitis    Allergy    Anxiety    Chronic low back pain    DDD (degenerative disc disease), lumbar    ED (erectile dysfunction)    Finger injury    cut pads off 3rd and 4th finger left hand/due to lawn mower accident   GERD (gastroesophageal reflux disease)    Heart murmur    as child only-    History of kidney stones    HTN (hypertension) 08/20/2016   Hyperlipidemia    Incomplete right bundle branch block    Prostate cancer ([facility]) dx 10/02/14   stage T1c   Sigmoid diverticulosis    Small cell lung cancer ([facility]) 03/2020   Wears glasses     ALLERGIES:  is allergic to bee venom, elemental sulfur, other, sulfa antibiotics, and namenda [memantine].  MEDICATIONS:  Current Outpatient Medications  Medication Sig Dispense Refill   demeclocycline (DECLOMYCIN) 300 MG tablet Take 1 tablet (300 mg total) by mouth 2 (two) times daily. 60 tablet 0   acetaminophen (TYLENOL) 325 MG tablet Take 2 tablets (650 mg total) by mouth every 6 (six) hours as needed for mild pain (or Fever >/= 101). 30 tablet 0   aspirin EC 81 MG tablet Take 81 mg by mouth daily. Swallow whole.     BREZTRI AEROSPHERE 160-9-4.8 MCG/ACT AERO INHALE 2 PUFFS INTO THE LUNGS IN THE MORNING AND AT BEDTIME. 10.7 g 10   famotidine (PEPCID) 20 MG tablet Take 1 tablet (20 mg total) by mouth 2 (two) times daily. 60 tablet 2   Multiple Vitamin (MULTIVITAMIN) capsule Take 1 capsule by mouth daily.     ondansetron (ZOFRAN-ODT) 8 MG disintegrating tablet Take 1 tablet (8 mg total) by mouth every 8 (eight) hours as needed for nausea or vomiting. 30 tablet 2   prochlorperazine (COMPAZINE) 10 MG tablet Take 1 tablet (10 mg total) by mouth every 6 (six) hours as needed. 30 tablet 2   sodium bicarbonate 650 MG tablet Take 1 tablet (650 mg total) by mouth 2 (two) times daily for 7 days. 14 tablet 0   traMADol (ULTRAM) 50 MG tablet TAKE 1/2 TABLET BY MOUTH TWICE A DAY AS NEEDED FOR PAIN 60 tablet 2   No current facility-administered medications  for this visit.   Facility-Administered Medications Ordered in Other Visits  Medication Dose Route Frequency Provider Last Rate Last Admin   0.9 %  sodium chloride infusion   Intravenous Once Curt Bears, MD       durvalumab (IMFINZI) 1,500 mg in sodium chloride 0.9 % 100 mL chemo infusion  1,500 mg Intravenous Once Curt Bears, MD        SURGICAL HISTORY:  Past Surgical History:  Procedure Laterality Date   BRONCHIAL BIOPSY  04/12/2020   Procedure: BRONCHIAL BIOPSIES;  Surgeon: Marshell Garfinkel, MD;  Location: [facility];  Service: Cardiopulmonary;;   BRONCHIAL BRUSHINGS  04/12/2020   Procedure: BRONCHIAL BRUSHINGS;  Surgeon: Marshell Garfinkel, MD;  Location: [facility];  Service: Cardiopulmonary;;   BRONCHIAL NEEDLE ASPIRATION BIOPSY  04/12/2020   Procedure: BRONCHIAL NEEDLE ASPIRATION  BIOPSIES;  Surgeon: Marshell Garfinkel, MD;  Location: [facility];  Service: Cardiopulmonary;;   BRONCHIAL WASHINGS  04/12/2020   Procedure: BRONCHIAL WASHINGS;  Surgeon: Marshell Garfinkel, MD;  Location: [facility];  Service: Cardiopulmonary;;   COLONOSCOPY     COLONOSCOPY W/ POLYPECTOMY  04-19-2012   ENDOBRONCHIAL ULTRASOUND N/A 04/12/2020   Procedure: ENDOBRONCHIAL ULTRASOUND;  Surgeon: Marshell Garfinkel, MD;  Location: [facility];  Service: Cardiopulmonary;  Laterality: N/A;   ESOPHAGOGASTRODUODENOSCOPY  08-05-2010   HEMOSTASIS CONTROL  04/12/2020   Procedure: HEMOSTASIS CONTROL;  Surgeon: Marshell Garfinkel, MD;  Location: [facility];  Service: Cardiopulmonary;;   IR IMAGING GUIDED PORT INSERTION  05/31/2020   POLYPECTOMY     PROSTATE BIOPSY  10/02/14   RADIOACTIVE SEED IMPLANT N/A 01/30/2015   Procedure: RADIOACTIVE SEED IMPLANT;  Surgeon: Rana Snare, MD;  Location: [facility];  Service: Urology;  Laterality: N/A;   UPPER GASTROINTESTINAL ENDOSCOPY  04/03/2021   VIDEO BRONCHOSCOPY N/A 04/12/2020   Procedure: VIDEO BRONCHOSCOPY WITHOUT FLUORO;  Surgeon: Marshell Garfinkel, MD;   Location: [facility];  Service: Cardiopulmonary;  Laterality: N/A;    REVIEW OF SYSTEMS:   Review of Systems  Constitutional: Negative for appetite change, chills, fatigue, fever and unexpected weight change.  HENT: Negative for mouth sores, nosebleeds, sore throat and trouble swallowing.   Eyes: Negative for eye problems and icterus.  Respiratory: Positive mild cough and shortness of breath. Negative for hemoptysis and wheezing.   Cardiovascular: Negative for chest pain and leg swelling.  Gastrointestinal: Negative for abdominal pain, constipation, diarrhea, nausea and vomiting.  Genitourinary: Negative for bladder incontinence, difficulty urinating, dysuria, frequency and hematuria.   Musculoskeletal: Negative for back pain, gait problem, neck pain and neck stiffness.  Skin: Negative for itching and rash.  Neurological: Negative for dizziness, extremity weakness, gait problem, headaches, light-headedness and seizures.  Hematological: Negative for adenopathy. Does not bruise/bleed easily.  Psychiatric/Behavioral: Positive sleep disturbance. Negative for confusion, depression. The patient is not nervous/anxious.     PHYSICAL EXAMINATION:  Blood pressure 100/74, pulse 84, temperature 98.5 F (36.9 C), temperature source Tympanic, resp. rate 17, height 6' (1.829 m), weight 154 lb 4.8 oz (70 kg), SpO2 100 %.  ECOG PERFORMANCE STATUS: 1  Physical Exam  Constitutional: Oriented to person, place, and time and well-developed, well-nourished, and in no distress.  HENT:  Head: Normocephalic and atraumatic.  Mouth/Throat: Oropharynx is clear and moist. No oropharyngeal exudate.  Eyes: Conjunctivae are normal. Right eye exhibits no discharge. Left eye exhibits no discharge. No scleral icterus.  Neck: Normal range of motion. Neck supple.  Cardiovascular: Normal rate, regular rhythm, normal heart sounds and intact distal pulses.   Pulmonary/Chest: Effort normal and breath sounds normal. No  respiratory distress. No wheezes. No rales.  Abdominal: Soft. Bowel sounds are normal. Exhibits no distension and no mass. There is no tenderness.  Musculoskeletal: Normal range of motion. Exhibits no edema.  Lymphadenopathy:    No cervical adenopathy.  Neurological: Alert and oriented to person, place, and time. Exhibits normal muscle tone. Gait normal. Coordination normal.  Skin: Skin is warm and dry. No rash noted. Not diaphoretic. No erythema. No pallor.  Psychiatric: Mood, memory and judgment normal.  Vitals reviewed.  LABORATORY DATA: Lab Results  Component Value Date   WBC 4.7 05/29/2021   HGB 11.3 (L) 05/29/2021   HCT 30.1 (L) 05/29/2021   MCV 94.4 05/29/2021   PLT 263 05/29/2021      Chemistry      Component Value Date/Time  NA 118 (LL) 05/29/2021 0741   K 3.9 05/29/2021 0741   CL 84 (L) 05/29/2021 0741   CO2 26 05/29/2021 0741   BUN 5 (L) 05/29/2021 0741   CREATININE 0.70 05/29/2021 0741      Component Value Date/Time   CALCIUM 9.4 05/29/2021 0741   ALKPHOS 86 05/29/2021 0741   AST 27 05/29/2021 0741   ALT 23 05/29/2021 0741   BILITOT 0.4 05/29/2021 0741       RADIOGRAPHIC STUDIES:  CT CHEST W CONTRAST  Result Date: 05/22/2021 CLINICAL DATA:  60 year old male with small cell lung cancer restaging. EXAM: CT CHEST, ABDOMEN, AND PELVIS WITH CONTRAST TECHNIQUE: Multidetector CT imaging of the chest, abdomen and pelvis was performed following the standard protocol during bolus administration of intravenous contrast. CONTRAST:  178mL OMNIPAQUE IOHEXOL 300 MG/ML  SOLN COMPARISON:  CT dated 04/18/2021. FINDINGS: CT CHEST FINDINGS Cardiovascular: There is no cardiomegaly or pericardial effusion. There is coronary vascular calcification. Right-sided Port-A-Cath with tip at the cavoatrial junction. The thoracic aorta is unremarkable. The origins of the great vessels of the aortic arch appear patent. The central pulmonary arteries are unremarkable. Mediastinum/Nodes: No  hilar or mediastinal adenopathy. The esophagus and the thyroid gland are grossly unremarkable. No mediastinal fluid collection. Lungs/Pleura: Post radiation changes of the left lung and left perihilar region with traction bronchiectasis and architectural distortion. Slight interval decrease in the size of the cavitary lesion in the lingula measuring up to 3.3 cm. There is a 7 x 8 mm nodular density in the medial left upper lobe (45/4) which has increased in size since the prior CT (previously measuring approximately 4 mm). A lobulated and somewhat cavitary lesion in the left upper lobe measures 12 x 18 mm similar to prior CT. Similar appearance of subpleural thickening and scarring along the posterior aspect of the superior segment of the left lower lobe. Biapical subpleural scarring. There is a 3 mm subpleural nodule in the anterior right upper lobe (50/4), increased in size since the prior CT. There is no pleural effusion pneumothorax. The central airways are patent. Musculoskeletal: No axillary adenopathy. Similar appearance of a 4 mm nodule in the subcutaneous soft tissues of the right lateral chest wall (34/2). A 1.2 x 1.3 cm nodular density in the subcutaneous soft tissues of the left anterior chest wall (34/2) has increased in size (previously measuring approximately 6 x 8 mm). There is degenerative changes of the spine. Stable 5 mm sclerotic lesion in the T8, indeterminate. No acute osseous pathology. CT ABDOMEN PELVIS FINDINGS No intra-abdominal free air or free fluid. Hepatobiliary: No focal liver abnormality is seen. No gallstones, gallbladder wall thickening, or biliary dilatation. Pancreas: Unremarkable. No pancreatic ductal dilatation or surrounding inflammatory changes. Spleen: Normal in size without focal abnormality. Adrenals/Urinary Tract: The right adrenal gland is unremarkable. Lobulated mass involving the left adrenal gland measures 4.2 x 2.8 cm (61/2), not significantly changed. There is a 2 cm  right renal interpolar cyst. There is no hydronephrosis on either side. There is symmetric enhancement and excretion of contrast by both kidneys. The visualized ureters appear unremarkable. There is irregular and thickened appearance of the posterior bladder wall which is not evaluated on this CT but may be related to enlargement of the prostate gland and changes of TURP. Cystoscopy may provide better evaluation. Stomach/Bowel: There is sigmoid diverticulosis without active inflammatory changes. There is no bowel obstruction or active inflammation. The appendix is normal. Vascular/Lymphatic: Advanced aortoiliac atherosclerotic disease. The IVC is unremarkable. No portal venous gas.  There is no adenopathy. Reproductive: Enlarged prostate gland with brachytherapy seeds . Other: None Musculoskeletal: Osteopenia with degenerative changes of the spine. No acute osseous pathology. IMPRESSION: 1. Post radiation changes of the left lung and left perihilar region with traction bronchiectasis and architectural distortion. Slight interval decrease in the size of the cavitary lesion in the lingula. 2. Interval increase in the size of pulmonary nodule in the medial left upper lobe. Additionally a subcutaneous nodule in the left anterior chest wall has increased since the prior CT. 3. No significant interval change in the size of the left adrenal mass. 4. Sigmoid diverticulosis. No bowel obstruction. Normal appendix. 5. Aortic Atherosclerosis (ICD10-I70.0). Electronically Signed   By: Anner Crete M.D.   On: 05/22/2021 19:20   MR Brain W Wo Contrast  Result Date: 05/16/2021 CLINICAL DATA:  Metastatic lung cancer post radiation. EXAM: MRI HEAD WITHOUT AND WITH CONTRAST TECHNIQUE: Multiplanar, multiecho pulse sequences of the brain and surrounding structures were obtained without and with intravenous contrast. CONTRAST:  24mL GADAVIST GADOBUTROL 1 MMOL/ML IV SOLN COMPARISON:  MRI head 02/13/2021 FINDINGS: Brain: New lesions:  9 mm necrotic lesion in the left temporoparietal lobe with surrounding edema. Axial image 134 4 mm necrotic lesion right temporal lobe with minimal edema. Axial image 172 5 mm enhancing lesion right lateral basal ganglia/external capsule. Axial image 190 3.5 mm lesion left frontal lobe axial image   195 3 mm lesion left head of caudate axial image 200. This image shows restricted diffusion. Stable lesions: Treated lesion left cerebellum without recurrence. Small amount of associated susceptibility. Ventricle size normal. Vascular: Normal arterial flow voids. Small developmental venous anomaly right medial parietal lobe unchanged. Skull and upper cervical spine: No focal skeletal metastasis identified. Sinuses/Orbits: Mild mucosal edema paranasal sinuses. Bilateral mastoid effusion similar to the prior MRI. 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   On: 05/16/2021 12:47   CT ABDOMEN PELVIS W CONTRAST  Result Date: 05/22/2021 CLINICAL DATA:  60 year old male with small cell lung cancer restaging. EXAM: CT CHEST, ABDOMEN, AND PELVIS WITH CONTRAST TECHNIQUE: Multidetector CT imaging of the chest, abdomen and pelvis was performed following the standard protocol during bolus administration of intravenous contrast. CONTRAST:  126mL OMNIPAQUE IOHEXOL 300 MG/ML  SOLN COMPARISON:  CT dated 04/18/2021. FINDINGS: CT CHEST FINDINGS Cardiovascular: There is no cardiomegaly or pericardial effusion. There is coronary vascular calcification. Right-sided Port-A-Cath with tip at the cavoatrial junction. The thoracic aorta is unremarkable. The origins of the great vessels of the aortic arch appear patent. The central pulmonary arteries are unremarkable. Mediastinum/Nodes: No hilar or mediastinal adenopathy. The esophagus and the thyroid gland are grossly  unremarkable. No mediastinal fluid collection. Lungs/Pleura: Post radiation changes of the left lung and left perihilar region with traction bronchiectasis and architectural distortion. Slight interval decrease in the size of the cavitary lesion in the lingula measuring up to 3.3 cm. There is a 7 x 8 mm nodular density in the medial left upper lobe (45/4) which has increased in size since the prior CT (previously measuring approximately 4 mm). A lobulated and somewhat cavitary lesion in the left upper lobe measures 12 x 18 mm similar to prior CT. Similar appearance of subpleural thickening and scarring along the posterior aspect of the superior segment of the left lower lobe. Biapical subpleural scarring. There is a 3 mm subpleural nodule in the anterior  right upper lobe (50/4), increased in size since the prior CT. There is no pleural effusion pneumothorax. The central airways are patent. Musculoskeletal: No axillary adenopathy. Similar appearance of a 4 mm nodule in the subcutaneous soft tissues of the right lateral chest wall (34/2). A 1.2 x 1.3 cm nodular density in the subcutaneous soft tissues of the left anterior chest wall (34/2) has increased in size (previously measuring approximately 6 x 8 mm). There is degenerative changes of the spine. Stable 5 mm sclerotic lesion in the T8, indeterminate. No acute osseous pathology. CT ABDOMEN PELVIS FINDINGS No intra-abdominal free air or free fluid. Hepatobiliary: No focal liver abnormality is seen. No gallstones, gallbladder wall thickening, or biliary dilatation. Pancreas: Unremarkable. No pancreatic ductal dilatation or surrounding inflammatory changes. Spleen: Normal in size without focal abnormality. Adrenals/Urinary Tract: The right adrenal gland is unremarkable. Lobulated mass involving the left adrenal gland measures 4.2 x 2.8 cm (61/2), not significantly changed. There is a 2 cm right renal interpolar cyst. There is no hydronephrosis on either side. There is  symmetric enhancement and excretion of contrast by both kidneys. The visualized ureters appear unremarkable. There is irregular and thickened appearance of the posterior bladder wall which is not evaluated on this CT but may be related to enlargement of the prostate gland and changes of TURP. Cystoscopy may provide better evaluation. Stomach/Bowel: There is sigmoid diverticulosis without active inflammatory changes. There is no bowel obstruction or active inflammation. The appendix is normal. Vascular/Lymphatic: Advanced aortoiliac atherosclerotic disease. The IVC is unremarkable. No portal venous gas. There is no adenopathy. Reproductive: Enlarged prostate gland with brachytherapy seeds . Other: None Musculoskeletal: Osteopenia with degenerative changes of the spine. No acute osseous pathology. IMPRESSION: 1. Post radiation changes of the left lung and left perihilar region with traction bronchiectasis and architectural distortion. Slight interval decrease in the size of the cavitary lesion in the lingula. 2. Interval increase in the size of pulmonary nodule in the medial left upper lobe. Additionally a subcutaneous nodule in the left anterior chest wall has increased since the prior CT. 3. No significant interval change in the size of the left adrenal mass. 4. Sigmoid diverticulosis. No bowel obstruction. Normal appendix. 5. Aortic Atherosclerosis (ICD10-I70.0). Electronically Signed   By: Anner Crete M.D.   On: 05/22/2021 19:20     ASSESSMENT/PLAN:  This is a very pleasant 60 year old Caucasian male diagnosed with extensive stage small cell lung cancer.  He presented with a left upper lobe lung mass in addition to left hilar mediastinal lymphadenopathy.  He also had metastatic disease to the brain.  He was diagnosed in May 2021.   He underwent palliative radiotherapy to the left hilar and mediastinal lymphadenopathy in addition to radiotherapy to the brain in October 2021.   He is currently  undergoing systemic chemotherapy with carboplatin for an AUC of 5 on day 1, etoposide 100 mg per metered squared on days 1, 2, and 3 with cosela infusion before chemotherapy.  He is status post 13 cycles of treatment.  Starting from cycle #5, the patient has been on maintenance immunotherapy IV every 4 weeks.   He is tolerating this well.    The patient is currently undergoing palliative radiation to the enlarging left adrenal nodules under the care of Dr. Lisbeth Renshaw.  His last treatment is expected on 06/05/2021.  The patient was seen with Dr. Julien Nordmann today.  Dr. Julien Nordmann had a lengthy discussion with the patient today about his current condition and recommended treatment options. Dr. Julien Nordmann  personally and independently reviewed the scan and discussed the results with the patient. The scan showed postradiation changes in the left lung left hilar region and a slight interval decrease in the size of the cavitary lesion in the lingula but there was a slight interval increase in size of pulmonary nodule in the medial left upper lobe and a subcutaneous nodule in the left anterior chest since prior imaging.  The left adrenal gland lesion showed no significant change.   Dr. Willette Alma discussed due to the slight change, he would recommend that the patient continue on the current treatment at the same dose; however, he recommended close interval follow up with a restaging CT scan after 2 cycles of treatment. If his future scans continue to show some progression, he will likely have to discontinue his treatment and change it to chemotherapy.   He will proceed with cycle #14 today as scheduled.   We will see him back for follow-up visit in 4 weeks for evaluation before starting cycle #15.   The patient was adamant to leave the hospital last week. Unfortunately, his sodium is low to 118 again today. He is asymptomatic. I reviewed with the patient and Dr. Julien Nordmann. The patient stated that he will not go back to the  hospital/ER. Dr. Julien Nordmann recommends we add demeclocycline 300 mg BID. We will also monitor his labs closely every week. I stressed the adverse side effects of hyponatremia including nausea, vomiting, lethargy, confusion, seizures, comas, and even death. If he experiences any symptoms, I advised he needs to go to the ER immediately. I also discussed he needs to have a 1 L of free water fluid restriction. 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 Dr. Julien Nordmann would like him to receive additional IVF today.   He will continue with [facility] tomorrow as scheduled. He will continue with radiation to the enlarging adrenal gland as scheduled. His last radiation is scheduled for 06/05/21.   The patient was advised to call immediately if he has any concerning symptoms in the interval. The patient voices understanding of current disease status and treatment options and is in agreement with the current care plan. All questions were answered. The patient knows to call the clinic with any problems, questions or concerns. We can certainly see the patient much sooner if necessary      No orders of the defined types were placed in this encounter.      Sire Poet L Reyden Smith, PA-C 05/29/21  ADDENDUM: Hematology/Oncology Attending: I had a face-to-face encounter with the patient today.  I reviewed his record, lab, scan and recommended his care plan.  This is a very pleasant 60 years old white male with extensive stage small cell lung cancer diagnosed in May 2021 status post palliative radiotherapy to left hilar and mediastinal lymphadenopathy as well as radiotherapy to the brain in October 2021.  The patient started induction systemic chemotherapy with carboplatin, etoposide and Imfinzi for 4 cycles followed by maintenance treatment with Imfinzi for another 9 cycles.  He has been tolerating his treatment  well but recent imaging studies showed evidence for disease progression in the adrenal gland and he is currently undergoing palliative radiotherapy to the adrenal gland as well as the metastatic disease in the brain. 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  The patient  was admitted to the hospital for treatment of his condition and he was discharged with sodium of 124 on salt tablets.  He did not want to stay in the hospital any longer for treatment of his condition. He had repeat CT scan of the chest, abdomen pelvis during his hospitalization that showed no concerning findings for disease progression except for slightly enlarging right upper lobe lung nodule in addition to a subcutaneous nodule in the anterior left chest area. I recommended for the patient to continue his treatment with Imfinzi and he will proceed with cycle #14 today.  We will consider repeat imaging studies after cycle #15. If he continues to have evidence for disease progression, we will switch him to second line systemic chemotherapy probably with carboplatin and etoposide again. For the hyponatremia, repeat sodium today showed sodium of 118.  We will continue the patient on the salt tablet but will also add demeclocycline to his treatment regimen. Will also consider The patient for IV fluid with normal saline and freshwater restriction to 1 L a day. The patient will come back for follow-up visit in 1 month but will continue to check his sodium level on weekly basis. He was advised to go immediately to the hospital if he has any concerning symptoms in the interval. The total time spent in the appointment was 40 minutes.  Disclaimer: This note was dictated with voice recognition software. Similar sounding words can inadvertently be transcribed and may be missed upon review. Eilleen Kempf, MD 05/29/21

## 2021-05-29 ENCOUNTER — Inpatient Hospital Stay (HOSPITAL_BASED_OUTPATIENT_CLINIC_OR_DEPARTMENT_OTHER): Payer: 59 | Admitting: Physician Assistant

## 2021-05-29 ENCOUNTER — Inpatient Hospital Stay: Payer: 59

## 2021-05-29 ENCOUNTER — Other Ambulatory Visit: Payer: 59

## 2021-05-29 ENCOUNTER — Other Ambulatory Visit: Payer: Self-pay

## 2021-05-29 ENCOUNTER — Inpatient Hospital Stay: Payer: 59 | Attending: Physician Assistant

## 2021-05-29 ENCOUNTER — Ambulatory Visit
Admission: RE | Admit: 2021-05-29 | Discharge: 2021-05-29 | Disposition: A | Discharge status: Home / Self Care | Payer: 59 | Source: Ambulatory Visit | Attending: Radiation Oncology | Admitting: Radiation Oncology

## 2021-05-29 ENCOUNTER — Ambulatory Visit: Payer: 59 | Admitting: Physician Assistant

## 2021-05-29 VITALS — BP 100/74 | HR 84 | Temp 98.5°F | Resp 17 | Ht 72.0 in | Wt 154.3 lb

## 2021-05-29 DIAGNOSIS — Z79899 Other long term (current) drug therapy: Secondary | ICD-10-CM | POA: Insufficient documentation

## 2021-05-29 DIAGNOSIS — C7931 Secondary malignant neoplasm of brain: Secondary | ICD-10-CM

## 2021-05-29 DIAGNOSIS — F17210 Nicotine dependence, cigarettes, uncomplicated: Secondary | ICD-10-CM | POA: Insufficient documentation

## 2021-05-29 DIAGNOSIS — Z5112 Encounter for antineoplastic immunotherapy: Secondary | ICD-10-CM

## 2021-05-29 DIAGNOSIS — E222 Syndrome of inappropriate secretion of antidiuretic hormone: Secondary | ICD-10-CM | POA: Diagnosis not present

## 2021-05-29 DIAGNOSIS — Z7982 Long term (current) use of aspirin: Secondary | ICD-10-CM | POA: Insufficient documentation

## 2021-05-29 DIAGNOSIS — Z510 Encounter for antineoplastic radiation therapy: Secondary | ICD-10-CM | POA: Insufficient documentation

## 2021-05-29 DIAGNOSIS — Z452 Encounter for adjustment and management of vascular access device: Secondary | ICD-10-CM | POA: Insufficient documentation

## 2021-05-29 DIAGNOSIS — Z95828 Presence of other vascular implants and grafts: Secondary | ICD-10-CM

## 2021-05-29 DIAGNOSIS — E871 Hypo-osmolality and hyponatremia: Secondary | ICD-10-CM

## 2021-05-29 DIAGNOSIS — C7970 Secondary malignant neoplasm of unspecified adrenal gland: Secondary | ICD-10-CM | POA: Insufficient documentation

## 2021-05-29 DIAGNOSIS — C61 Malignant neoplasm of prostate: Secondary | ICD-10-CM

## 2021-05-29 DIAGNOSIS — C3412 Malignant neoplasm of upper lobe, left bronchus or lung: Secondary | ICD-10-CM

## 2021-05-29 DIAGNOSIS — I10 Essential (primary) hypertension: Secondary | ICD-10-CM | POA: Insufficient documentation

## 2021-05-29 DIAGNOSIS — Z8546 Personal history of malignant neoplasm of prostate: Secondary | ICD-10-CM | POA: Insufficient documentation

## 2021-05-29 LAB — CMP (CANCER CENTER ONLY)
ALT: 23 U/L (ref 0–44)
AST: 27 U/L (ref 15–41)
Albumin: 3.6 g/dL (ref 3.5–5.0)
Alkaline Phosphatase: 86 U/L (ref 38–126)
Anion gap: 8 (ref 5–15)
BUN: 5 mg/dL — ABNORMAL LOW (ref 6–20)
CO2: 26 mmol/L (ref 22–32)
Calcium: 9.4 mg/dL (ref 8.9–10.3)
Chloride: 84 mmol/L — ABNORMAL LOW (ref 98–111)
Creatinine: 0.7 mg/dL (ref 0.61–1.24)
GFR, Estimated: >60 mL/min (ref >60)
Glucose, Bld: 102 mg/dL — ABNORMAL HIGH (ref 70–99)
Potassium: 3.9 mmol/L (ref 3.5–5.1)
Sodium: 118 mmol/L — CL (ref 135–145)
Total Bilirubin: 0.4 mg/dL (ref 0.3–1.2)
Total Protein: 7.3 g/dL (ref 6.5–8.1)

## 2021-05-29 LAB — CBC WITH DIFFERENTIAL (CANCER CENTER ONLY)
Abs Immature Granulocytes: 0.07 10*3/uL (ref 0.00–0.07)
Basophils Absolute: 0 10*3/uL (ref 0.0–0.1)
Basophils Relative: 0 %
Eosinophils Absolute: 0.5 10*3/uL (ref 0.0–0.5)
Eosinophils Relative: 10 %
HCT: 30.1 % — ABNORMAL LOW (ref 39.0–52.0)
Hemoglobin: 11.3 g/dL — ABNORMAL LOW (ref 13.0–17.0)
Immature Granulocytes: 2 %
Lymphocytes Relative: 6 %
Lymphs Abs: 0.3 10*3/uL — ABNORMAL LOW (ref 0.7–4.0)
MCH: 35.4 pg — ABNORMAL HIGH (ref 26.0–34.0)
MCHC: 37.5 g/dL — ABNORMAL HIGH (ref 30.0–36.0)
MCV: 94.4 fL (ref 80.0–100.0)
Monocytes Absolute: 0.5 10*3/uL (ref 0.1–1.0)
Monocytes Relative: 11 %
Neutro Abs: 3.3 10*3/uL (ref 1.7–7.7)
Neutrophils Relative %: 71 %
Platelet Count: 263 10*3/uL (ref 150–400)
RBC: 3.19 MIL/uL — ABNORMAL LOW (ref 4.22–5.81)
RDW: 11.7 % (ref 11.5–15.5)
WBC Count: 4.7 10*3/uL (ref 4.0–10.5)
nRBC: 0 % (ref 0.0–0.2)

## 2021-05-29 MED ORDER — SODIUM CHLORIDE 0.9 % IV SOLN
Freq: Once | INTRAVENOUS | Status: DC
  Filled 2021-05-29: qty 250

## 2021-05-29 MED ORDER — SODIUM CHLORIDE 0.9 % IV SOLN
Freq: Once | INTRAVENOUS | Status: AC
  Filled 2021-05-29: qty 250

## 2021-05-29 MED ORDER — DEMECLOCYCLINE HCL 300 MG PO TABS: 300.0000 mg | ORAL_TABLET | Freq: Two times a day (BID) | ORAL | 0 refills | Status: DC

## 2021-05-29 MED ORDER — SODIUM CHLORIDE 0.9% FLUSH
10.0000 mL | Freq: Once | INTRAVENOUS | Status: AC
Start: 2021-05-29 — End: 2021-05-29
  Administered 2021-05-29: 10 mL
  Filled 2021-05-29: qty 10

## 2021-05-29 MED ORDER — SODIUM CHLORIDE 0.9 % IV SOLN
1500.0000 mg | Freq: Once | INTRAVENOUS | Status: AC
  Administered 2021-05-29: 1500 mg via INTRAVENOUS
  Filled 2021-05-29: qty 30

## 2021-05-29 NOTE — Patient Instructions (Signed)
[facility]   Discharge Instructions: Thank you for choosing [facility] to provide your oncology and hematology care.   If you have a lab appointment with the Cancer Center, please go directly to the Cancer Center and check in at the registration area.   Wear comfortable clothing and clothing appropriate for easy access to any Portacath or PICC line.   We strive to give you quality time with your provider. You may need to reschedule your appointment if you arrive late (15 or more minutes).  Arriving late affects you and other patients whose appointments are after yours.  Also, if you miss three or more appointments without notifying the office, you may be dismissed from the clinic at the provider's discretion.      For prescription refill requests, have your pharmacy contact our office and allow 72 hours for refills to be completed.    Today you received the following chemotherapy and/or immunotherapy agents: Durvalumab (Imfinzi).      To help prevent nausea and vomiting after your treatment, we encourage you to take your nausea medication as directed.  BELOW ARE SYMPTOMS THAT SHOULD BE REPORTED IMMEDIATELY: *FEVER GREATER THAN 100.4 F (38 C) OR HIGHER *CHILLS OR SWEATING *NAUSEA AND VOMITING THAT IS NOT CONTROLLED WITH YOUR NAUSEA MEDICATION *UNUSUAL SHORTNESS OF BREATH *UNUSUAL BRUISING OR BLEEDING *URINARY PROBLEMS (pain or burning when urinating, or frequent urination) *BOWEL PROBLEMS (unusual diarrhea, constipation, pain near the anus) TENDERNESS IN MOUTH AND THROAT WITH OR WITHOUT PRESENCE OF ULCERS (sore throat, sores in mouth, or a toothache) UNUSUAL RASH, SWELLING OR PAIN  UNUSUAL VAGINAL DISCHARGE OR ITCHING   Items with * indicate a potential emergency and should be followed up as soon as possible or go to the Emergency Department if any problems should occur.  Please show the CHEMOTHERAPY ALERT CARD or IMMUNOTHERAPY ALERT CARD  at check-in to the Emergency Department and triage nurse.  Should you have questions after your visit or need to cancel or reschedule your appointment, please contact [facility]  Dept: [phone]  and follow the prompts.  Office hours are 8:00 a.m. to 4:30 p.m. Monday - Friday. Please note that voicemails left after 4:00 p.m. may not be returned until the following business day.  We are closed weekends and major holidays. You have access to a nurse at all times for urgent questions. Please call the main number to the clinic Dept: [phone] and follow the prompts.   For any non-urgent questions, you may also contact your provider using MyChart. We now offer e-Visits for anyone 18 and older to request care online for non-urgent symptoms. For details visit [email or web address].   Also download the MyChart app! Go to the app store, search "MyChart", open the app, select [facility], and log in with your MyChart username and password.  Due to Covid, a mask is required upon entering the hospital/clinic. If you do not have a mask, one will be given to you upon arrival. For doctor visits, patients may have 1 support person aged 18 or older with them. For treatment visits, patients cannot have anyone with them due to current Covid guidelines and our immunocompromised population.   

## 2021-05-30 ENCOUNTER — Ambulatory Visit
Admission: RE | Admit: 2021-05-30 | Discharge: 2021-05-30 | Disposition: A | Discharge status: Home / Self Care | Payer: 59 | Source: Ambulatory Visit | Attending: Radiation Oncology | Admitting: Radiation Oncology

## 2021-05-30 ENCOUNTER — Telehealth: Payer: Self-pay | Admitting: Physician Assistant

## 2021-05-30 ENCOUNTER — Encounter: Payer: Self-pay | Admitting: Radiation Oncology

## 2021-05-30 ENCOUNTER — Other Ambulatory Visit: Payer: Self-pay | Admitting: Physician Assistant

## 2021-05-30 DIAGNOSIS — E222 Syndrome of inappropriate secretion of antidiuretic hormone: Secondary | ICD-10-CM

## 2021-05-30 DIAGNOSIS — Z5112 Encounter for antineoplastic immunotherapy: Secondary | ICD-10-CM | POA: Diagnosis not present

## 2021-05-30 DIAGNOSIS — C3412 Malignant neoplasm of upper lobe, left bronchus or lung: Secondary | ICD-10-CM

## 2021-05-30 MED ORDER — SODIUM BICARBONATE 650 MG PO TABS: 650.0000 mg | ORAL_TABLET | Freq: Two times a day (BID) | ORAL | 0 refills | Status: AC

## 2021-05-30 NOTE — Telephone Encounter (Signed)
Scheduled per los. Called and spoke with patient. Confirmed appt 

## 2021-05-30 NOTE — Progress Notes (Signed)
  Name: Richard Santos  MRN: [id number]  Date: 05/30/2021   DOB: 1960/12/19  Stereotactic Radiosurgery Operative Note  PRE-OPERATIVE DIAGNOSIS:  Multiple Brain Metastases  POST-OPERATIVE DIAGNOSIS:  Multiple Brain Metastases  PROCEDURE:  Stereotactic Radiosurgery  SURGEON:  Judith Part, MD  NARRATIVE: The patient underwent a radiation treatment planning session in the radiation oncology simulation suite under the care of the radiation oncology physician and physicist.  I participated closely in the radiation treatment planning afterwards. The patient underwent planning CT which was fused to 3T high resolution MRI with 1 mm axial slices.  These images were fused on the planning system.  We contoured the gross target volumes and subsequently expanded this to yield the Planning Target Volume. I actively participated in the planning process.  I helped to define and review the target contours and also the contours of the optic pathway, eyes, brainstem and selected nearby organs at risk.  All the dose constraints for critical structures were reviewed and compared to AAPM Task Group 101.  The prescription dose conformity was reviewed.  I approved the plan electronically.    Accordingly, [name] was brought to the TrueBeam stereotactic radiation treatment linac and placed in the custom immobilization mask.  The patient was aligned according to the IR fiducial markers with BrainLab Exactrac, then orthogonal x-rays were used in ExacTrac with the 6DOF robotic table and the shifts were made to align the patient  [name] received stereotactic radiosurgery uneventfully.    Lesions treated:  5   Complex lesions treated:  0 (>3.5 cm, <50mm of optic path, or within the brainstem)   The detailed description of the procedure is recorded in the radiation oncology procedure note.  I was present for the duration of the procedure.  DISPOSITION:  Following delivery, the patient was transported to  nursing in stable condition and monitored for possible acute effects to be discharged to home in stable condition with follow-up in one month.  Judith Part, MD 05/30/2021 1:09 PM

## 2021-05-30 NOTE — Progress Notes (Signed)
Mr. Berrios rested with [place] for 30 minutes following his SRS treatment.  Patient denies headache, dizziness, nausea, diplopia or ringing in the ears. Denies fatigue. Patient without complaints. Understands to avoid strenuous activity for the next 24 hours and call [phone] with needs.   Gloriajean Dell. Leonie Green, BSN

## 2021-06-02 ENCOUNTER — Other Ambulatory Visit: Payer: Self-pay

## 2021-06-02 ENCOUNTER — Ambulatory Visit
Admission: RE | Admit: 2021-06-02 | Discharge: 2021-06-02 | Disposition: A | Discharge status: Home / Self Care | Payer: 59 | Source: Ambulatory Visit | Attending: Radiation Oncology | Admitting: Radiation Oncology

## 2021-06-02 DIAGNOSIS — Z5112 Encounter for antineoplastic immunotherapy: Secondary | ICD-10-CM | POA: Diagnosis not present

## 2021-06-03 ENCOUNTER — Encounter: Payer: Self-pay | Admitting: Internal Medicine

## 2021-06-03 ENCOUNTER — Inpatient Hospital Stay: Payer: 59

## 2021-06-03 ENCOUNTER — Ambulatory Visit
Admission: RE | Admit: 2021-06-03 | Discharge: 2021-06-03 | Disposition: A | Discharge status: Home / Self Care | Payer: 59 | Source: Ambulatory Visit | Attending: Radiation Oncology | Admitting: Radiation Oncology

## 2021-06-03 ENCOUNTER — Encounter: Payer: Self-pay | Admitting: Emergency Medicine

## 2021-06-03 ENCOUNTER — Telehealth: Payer: Self-pay

## 2021-06-03 ENCOUNTER — Inpatient Hospital Stay
Admission: EM | Admit: 2021-06-03 | Discharge: 2021-06-05 | DRG: 643 | Disposition: A | Discharge status: Home Health | Payer: 59 | Attending: Internal Medicine | Admitting: Internal Medicine

## 2021-06-03 ENCOUNTER — Other Ambulatory Visit: Payer: Self-pay

## 2021-06-03 ENCOUNTER — Other Ambulatory Visit: Payer: Self-pay | Admitting: Physician Assistant

## 2021-06-03 DIAGNOSIS — Z79899 Other long term (current) drug therapy: Secondary | ICD-10-CM | POA: Diagnosis not present

## 2021-06-03 DIAGNOSIS — C799 Secondary malignant neoplasm of unspecified site: Secondary | ICD-10-CM

## 2021-06-03 DIAGNOSIS — C3412 Malignant neoplasm of upper lobe, left bronchus or lung: Secondary | ICD-10-CM

## 2021-06-03 DIAGNOSIS — Z66 Do not resuscitate: Secondary | ICD-10-CM | POA: Diagnosis present

## 2021-06-03 DIAGNOSIS — Z888 Allergy status to other drugs, medicaments and biological substances status: Secondary | ICD-10-CM

## 2021-06-03 DIAGNOSIS — Z882 Allergy status to sulfonamides status: Secondary | ICD-10-CM

## 2021-06-03 DIAGNOSIS — E43 Unspecified severe protein-calorie malnutrition: Secondary | ICD-10-CM | POA: Insufficient documentation

## 2021-06-03 DIAGNOSIS — Z91030 Bee allergy status: Secondary | ICD-10-CM

## 2021-06-03 DIAGNOSIS — C7970 Secondary malignant neoplasm of unspecified adrenal gland: Secondary | ICD-10-CM | POA: Diagnosis present

## 2021-06-03 DIAGNOSIS — F17210 Nicotine dependence, cigarettes, uncomplicated: Secondary | ICD-10-CM | POA: Diagnosis present

## 2021-06-03 DIAGNOSIS — J439 Emphysema, unspecified: Secondary | ICD-10-CM | POA: Diagnosis present

## 2021-06-03 DIAGNOSIS — Z8546 Personal history of malignant neoplasm of prostate: Secondary | ICD-10-CM | POA: Diagnosis not present

## 2021-06-03 DIAGNOSIS — E222 Syndrome of inappropriate secretion of antidiuretic hormone: Principal | ICD-10-CM | POA: Diagnosis present

## 2021-06-03 DIAGNOSIS — Z5112 Encounter for antineoplastic immunotherapy: Secondary | ICD-10-CM | POA: Diagnosis not present

## 2021-06-03 DIAGNOSIS — K219 Gastro-esophageal reflux disease without esophagitis: Secondary | ICD-10-CM | POA: Diagnosis present

## 2021-06-03 DIAGNOSIS — F1010 Alcohol abuse, uncomplicated: Secondary | ICD-10-CM | POA: Diagnosis present

## 2021-06-03 DIAGNOSIS — Z720 Tobacco use: Secondary | ICD-10-CM | POA: Diagnosis present

## 2021-06-03 DIAGNOSIS — Z95828 Presence of other vascular implants and grafts: Secondary | ICD-10-CM

## 2021-06-03 DIAGNOSIS — E871 Hypo-osmolality and hyponatremia: Secondary | ICD-10-CM

## 2021-06-03 DIAGNOSIS — E785 Hyperlipidemia, unspecified: Secondary | ICD-10-CM | POA: Diagnosis present

## 2021-06-03 DIAGNOSIS — C7931 Secondary malignant neoplasm of brain: Secondary | ICD-10-CM | POA: Diagnosis present

## 2021-06-03 DIAGNOSIS — Z8042 Family history of malignant neoplasm of prostate: Secondary | ICD-10-CM

## 2021-06-03 DIAGNOSIS — Z20822 Contact with and (suspected) exposure to covid-19: Secondary | ICD-10-CM | POA: Diagnosis present

## 2021-06-03 DIAGNOSIS — Z8249 Family history of ischemic heart disease and other diseases of the circulatory system: Secondary | ICD-10-CM

## 2021-06-03 DIAGNOSIS — J309 Allergic rhinitis, unspecified: Secondary | ICD-10-CM | POA: Diagnosis present

## 2021-06-03 DIAGNOSIS — Z7982 Long term (current) use of aspirin: Secondary | ICD-10-CM | POA: Diagnosis not present

## 2021-06-03 DIAGNOSIS — Z8719 Personal history of other diseases of the digestive system: Secondary | ICD-10-CM

## 2021-06-03 DIAGNOSIS — I10 Essential (primary) hypertension: Secondary | ICD-10-CM | POA: Diagnosis present

## 2021-06-03 LAB — MRSA NEXT GEN BY PCR, NASAL: MRSA by PCR Next Gen: NOT DETECTED

## 2021-06-03 LAB — SODIUM
Sodium: 114 mmol/L — CL (ref 135–145)
Sodium: 116 mmol/L — CL (ref 135–145)
Sodium: 118 mmol/L — CL (ref 135–145)

## 2021-06-03 LAB — CBC WITH DIFFERENTIAL (CANCER CENTER ONLY)
Abs Immature Granulocytes: 0.06 10*3/uL (ref 0.00–0.07)
Basophils Absolute: 0 10*3/uL (ref 0.0–0.1)
Basophils Relative: 0 %
Eosinophils Absolute: 0.4 10*3/uL (ref 0.0–0.5)
Eosinophils Relative: 7 %
HCT: 31.7 % — ABNORMAL LOW (ref 39.0–52.0)
Hemoglobin: 11.9 g/dL — ABNORMAL LOW (ref 13.0–17.0)
Immature Granulocytes: 1 %
Lymphocytes Relative: 3 %
Lymphs Abs: 0.1 10*3/uL — ABNORMAL LOW (ref 0.7–4.0)
MCH: 35.4 pg — ABNORMAL HIGH (ref 26.0–34.0)
MCHC: 37.5 g/dL — ABNORMAL HIGH (ref 30.0–36.0)
MCV: 94.3 fL (ref 80.0–100.0)
Monocytes Absolute: 0.6 10*3/uL (ref 0.1–1.0)
Monocytes Relative: 10 %
Neutro Abs: 4.4 10*3/uL (ref 1.7–7.7)
Neutrophils Relative %: 79 %
Platelet Count: 245 10*3/uL (ref 150–400)
RBC: 3.36 MIL/uL — ABNORMAL LOW (ref 4.22–5.81)
RDW: 12.2 % (ref 11.5–15.5)
WBC Count: 5.6 10*3/uL (ref 4.0–10.5)
nRBC: 0 % (ref 0.0–0.2)

## 2021-06-03 LAB — CMP (CANCER CENTER ONLY)
ALT: 26 U/L (ref 0–44)
AST: 36 U/L (ref 15–41)
Albumin: 3.7 g/dL (ref 3.5–5.0)
Alkaline Phosphatase: 95 U/L (ref 38–126)
Anion gap: 9 (ref 5–15)
BUN: 6 mg/dL (ref 6–20)
CO2: 25 mmol/L (ref 22–32)
Calcium: 9.7 mg/dL (ref 8.9–10.3)
Chloride: 81 mmol/L — ABNORMAL LOW (ref 98–111)
Creatinine: 0.75 mg/dL (ref 0.61–1.24)
GFR, Estimated: >60 mL/min (ref >60)
Glucose, Bld: 103 mg/dL — ABNORMAL HIGH (ref 70–99)
Potassium: 4.4 mmol/L (ref 3.5–5.1)
Sodium: 115 mmol/L — CL (ref 135–145)
Total Bilirubin: 0.5 mg/dL (ref 0.3–1.2)
Total Protein: 7.3 g/dL (ref 6.5–8.1)

## 2021-06-03 LAB — CBC
Hemoglobin: 11.7 g/dL — ABNORMAL LOW (ref 13.0–17.0)
Platelets: 199 10*3/uL (ref 150–400)
WBC: 4.9 10*3/uL (ref 4.0–10.5)

## 2021-06-03 LAB — URINALYSIS, COMPLETE (UACMP) WITH MICROSCOPIC
Bacteria, UA: NONE SEEN
Bilirubin Urine: NEGATIVE
Glucose, UA: 50 mg/dL — AB
Hgb urine dipstick: NEGATIVE
Ketones, ur: NEGATIVE mg/dL
Leukocytes,Ua: NEGATIVE
Nitrite: NEGATIVE
Protein, ur: NEGATIVE mg/dL
Specific Gravity, Urine: 1.011 (ref 1.005–1.030)
pH: 6 (ref 5.0–8.0)

## 2021-06-03 LAB — RESP PANEL BY RT-PCR (FLU A&B, COVID) ARPGX2
Influenza A by PCR: NEGATIVE
Influenza B by PCR: NEGATIVE
SARS Coronavirus 2 by RT PCR: NEGATIVE

## 2021-06-03 LAB — GLUCOSE, CAPILLARY: Glucose-Capillary: 110 mg/dL — ABNORMAL HIGH (ref 70–99)

## 2021-06-03 LAB — OSMOLALITY: Osmolality: 233 mOsm/kg — CL (ref 275–295)

## 2021-06-03 LAB — SODIUM, URINE, RANDOM: Sodium, Ur: 39 mmol/L

## 2021-06-03 LAB — MAGNESIUM: Magnesium: 1.5 mg/dL — ABNORMAL LOW (ref 1.7–2.4)

## 2021-06-03 LAB — OSMOLALITY, URINE: Osmolality, Ur: 330 mOsm/kg (ref 300–900)

## 2021-06-03 MED ORDER — ADULT MULTIVITAMIN W/MINERALS CH
1.0000 | ORAL_TABLET | Freq: Every day | ORAL | Status: DC
  Administered 2021-06-04: 1 via ORAL
  Filled 2021-06-03: qty 1

## 2021-06-03 MED ORDER — ACETAMINOPHEN 325 MG PO TABS
650.0000 mg | ORAL_TABLET | Freq: Four times a day (QID) | ORAL | Status: DC | PRN
  Administered 2021-06-03 – 2021-06-04 (×2): 650 mg via ORAL
  Filled 2021-06-03 (×2): qty 2

## 2021-06-03 MED ORDER — LORAZEPAM 1 MG PO TABS
1.0000 mg | ORAL_TABLET | ORAL | Status: DC | PRN
Start: 2021-06-03 — End: 2021-06-05
  Administered 2021-06-03 – 2021-06-04 (×2): 1 mg via ORAL
  Filled 2021-06-03 (×2): qty 1

## 2021-06-03 MED ORDER — SODIUM CHLORIDE 3 % IV SOLN
INTRAVENOUS | Status: DC
  Filled 2021-06-03 (×4): qty 500

## 2021-06-03 MED ORDER — ENOXAPARIN SODIUM 40 MG/0.4ML IJ SOSY
40.0000 mg | PREFILLED_SYRINGE | INTRAMUSCULAR | Status: DC
  Administered 2021-06-03 – 2021-06-04 (×2): 40 mg via SUBCUTANEOUS
  Filled 2021-06-03 (×2): qty 0.4

## 2021-06-03 MED ORDER — FOLIC ACID 1 MG PO TABS
1.0000 mg | ORAL_TABLET | Freq: Every day | ORAL | Status: DC
  Administered 2021-06-04 – 2021-06-05 (×2): 1 mg via ORAL
  Filled 2021-06-03 (×2): qty 1

## 2021-06-03 MED ORDER — UMECLIDINIUM BROMIDE 62.5 MCG/INH IN AEPB
1.0000 | INHALATION_SPRAY | Freq: Every day | RESPIRATORY_TRACT | Status: DC
  Administered 2021-06-04 – 2021-06-05 (×2): 1 via RESPIRATORY_TRACT
  Filled 2021-06-03: qty 7

## 2021-06-03 MED ORDER — MAGNESIUM SULFATE 2 GM/50ML IV SOLN
2.0000 g | Freq: Once | INTRAVENOUS | Status: AC
  Administered 2021-06-03: 2 g via INTRAVENOUS
  Filled 2021-06-03: qty 50

## 2021-06-03 MED ORDER — ONDANSETRON HCL 4 MG PO TABS: 4.0000 mg | ORAL_TABLET | Freq: Four times a day (QID) | ORAL | Status: DC | PRN

## 2021-06-03 MED ORDER — MULTIVITAMINS PO CAPS: 1.0000 | ORAL_CAPSULE | Freq: Every day | ORAL | Status: DC

## 2021-06-03 MED ORDER — MOMETASONE FURO-FORMOTEROL FUM 200-5 MCG/ACT IN AERO
2.0000 | INHALATION_SPRAY | Freq: Two times a day (BID) | RESPIRATORY_TRACT | Status: DC
  Administered 2021-06-03 – 2021-06-05 (×4): 2 via RESPIRATORY_TRACT
  Filled 2021-06-03: qty 8.8

## 2021-06-03 MED ORDER — ONDANSETRON HCL 4 MG/2ML IJ SOLN
4.0000 mg | Freq: Four times a day (QID) | INTRAMUSCULAR | Status: DC | PRN
  Administered 2021-06-04: 4 mg via INTRAVENOUS
  Filled 2021-06-03: qty 2

## 2021-06-03 MED ORDER — HEPARIN SOD (PORK) LOCK FLUSH 100 UNIT/ML IV SOLN
500.0000 [IU] | Freq: Once | INTRAVENOUS | Status: AC
  Administered 2021-06-03: 500 [IU]
  Filled 2021-06-03: qty 5

## 2021-06-03 MED ORDER — BUDESON-GLYCOPYRROL-FORMOTEROL 160-9-4.8 MCG/ACT IN AERO: 2.0000 | INHALATION_SPRAY | Freq: Every day | RESPIRATORY_TRACT | Status: DC

## 2021-06-03 MED ORDER — DEMECLOCYCLINE HCL 150 MG PO TABS
300.0000 mg | ORAL_TABLET | Freq: Two times a day (BID) | ORAL | Status: DC
  Administered 2021-06-03: 300 mg via ORAL
  Filled 2021-06-03 (×5): qty 2

## 2021-06-03 MED ORDER — THIAMINE HCL 100 MG/ML IJ SOLN
100.0000 mg | Freq: Every day | INTRAMUSCULAR | Status: DC
  Administered 2021-06-05: 100 mg via INTRAVENOUS
  Filled 2021-06-03: qty 2

## 2021-06-03 MED ORDER — THIAMINE HCL 100 MG PO TABS
100.0000 mg | ORAL_TABLET | Freq: Every day | ORAL | Status: DC
  Administered 2021-06-04: 100 mg via ORAL
  Filled 2021-06-03: qty 1

## 2021-06-03 MED ORDER — ASPIRIN EC 81 MG PO TBEC
81.0000 mg | DELAYED_RELEASE_TABLET | Freq: Every day | ORAL | Status: DC
  Administered 2021-06-04 – 2021-06-05 (×2): 81 mg via ORAL
  Filled 2021-06-03 (×2): qty 1

## 2021-06-03 MED ORDER — CHLORHEXIDINE GLUCONATE CLOTH 2 % EX PADS
6.0000 | MEDICATED_PAD | Freq: Every day | CUTANEOUS | Status: DC
  Administered 2021-06-03: 6 via TOPICAL

## 2021-06-03 MED ORDER — FAMOTIDINE 20 MG PO TABS
20.0000 mg | ORAL_TABLET | Freq: Two times a day (BID) | ORAL | Status: DC
  Administered 2021-06-03 – 2021-06-05 (×4): 20 mg via ORAL
  Filled 2021-06-03 (×4): qty 1

## 2021-06-03 MED ORDER — TRAMADOL HCL 50 MG PO TABS
50.0000 mg | ORAL_TABLET | Freq: Two times a day (BID) | ORAL | Status: DC | PRN
Start: 2021-06-03 — End: 2021-06-05
  Administered 2021-06-03 – 2021-06-04 (×2): 50 mg via ORAL
  Filled 2021-06-03 (×2): qty 1

## 2021-06-03 MED ORDER — LORAZEPAM 2 MG/ML IJ SOLN: 1.0000 mg | INTRAMUSCULAR | Status: DC | PRN

## 2021-06-03 MED ORDER — SODIUM CHLORIDE 0.9% FLUSH
10.0000 mL | Freq: Once | INTRAVENOUS | Status: AC
  Administered 2021-06-03: 10 mL
  Filled 2021-06-03: qty 10

## 2021-06-03 NOTE — ED Notes (Signed)
See triage note, pt reports sent for critical NA 115. Had labs drawn today [facility]. Stage 4 lung cancer

## 2021-06-03 NOTE — Telephone Encounter (Signed)
I spoke with pts wife and advised that pt was given this rx and advised it is an antibiotic that will help raise his sodium. I also advised the wife of the following as discussed during his 05/29/21 OV with Cassandra PA-C:  "The patient was adamant to leave the hospital last week. Unfortunately, his sodium is low to 118 again today. He is asymptomatic. I reviewed with the patient and Dr. Julien Nordmann. The patient stated that he will not go back to the hospital/ER. Dr. Julien Nordmann recommends we add demeclocycline 300 mg BID. We will also monitor his labs closely every week. I stressed the adverse side effects of hyponatremia including nausea, vomiting, lethargy, confusion, seizures, comas, and even death. If he experiences any symptoms, I advised he needs to go to the ER immediately. I also discussed he needs to have a 1 L of free water fluid restriction. He will continue to take his salt tablets BID for the hyponatremia which may be related to SIADH which may be a paraneoplastic syndrome from his malignancy".  Pts wife expressed understanding of this information.

## 2021-06-03 NOTE — ED Notes (Signed)
Pt provided ginger ale as requested

## 2021-06-03 NOTE — ED Notes (Signed)
Dr Francine Graven notified of Na 114, orders changed to stepdown

## 2021-06-03 NOTE — ED Triage Notes (Signed)
Referred to ED for treatment of Sodium 116.  Blood work drawn today.  Hx of lung cancer, currently in immunotherapy.

## 2021-06-03 NOTE — ED Notes (Signed)
Denton Ar RN aware of assigned bed

## 2021-06-03 NOTE — Progress Notes (Signed)
PHARMACY CONSULT NOTE - FOLLOW UP  Pharmacy Consult for Electrolyte Monitoring and Replacement   Recent Labs: Potassium (mmol/L)  Date Value  06/03/2021 4.4   Magnesium (mg/dL)  Date Value  06/03/2021 1.5 (L)   Calcium (mg/dL)  Date Value  06/03/2021 9.7   Albumin (g/dL)  Date Value  06/03/2021 3.7   Phosphorus (mg/dL)  Date Value  04/12/2020 4.2   Sodium (mmol/L)  Date Value  06/03/2021 115 (LL)     Assessment: 60 year old male with lung cancer send to the ED for low sodium on labs drawn at the [facility]. Patient has h/o hyponatremia with recent admission for the same. Patient is to start on hypertonic saline per Nephrology.  Goal of Therapy:  Increase in Na ~ 8 mEq in 24 hrs  Plan:  Starting hypertonic saline at 25 ml/hr Na ordered q2h x 2, then q4h Continue to follow closely for any necessary adjustments  Tawnya Crook ,PharmD Clinical Pharmacist 06/03/2021 4:39 PM

## 2021-06-03 NOTE — H&P (Signed)
History and Physical    Richard Santos GNF:[id number] DOB: [birth year] DOA: 06/03/2021  PCP: [name], MD   Patient coming from: Home  I have personally briefly reviewed patient's old medical records in [place]  Chief Complaint: Sent from cancer center for hyponatremia  HPI: Richard Santos is a 60 y.o. 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 Patient was recently hospitalized for same and upon discharge his serum sodium was 125. He complains of nausea and poor oral intake but denies having any emesis, no diarrhea, no abdominal pain, no dizziness, no lightheadedness, no weakness, no chest pain, no shortness of breath, no headache, no focal deficits or blurred vision. Labs show sodium 115, potassium 4.4, chloride 81, bicarb 25, glucose 103, BUN 6, creatinine 0.75, calcium 9.7, magnesium 1.5, alkaline phosphatase 95, albumin 3.7, AST 36, ALT 26, total protein 7.3, serum osmolality 233, white count 5.6, hemoglobin 11.9, hematocrit 31.7, MCV 94.3, RDW 12.2, platelet count 245, urine sodium 39 Urinalysis is sterile Respiratory viral panel is negative  ED Course: Patient is a 60 year old male with a history of metastatic non-small cell lung cancer who was sent to the emergency room for evaluation of hyponatremia. His serum sodium is 115, serum osmolality is low at 233 and urine sodium is 39. Patient will be admitted to the hospital for further evaluation.  Review of Systems: As per HPI otherwise all other systems reviewed and negative.    Past Medical History:  Diagnosis Date   Allergic rhinitis    Allergy    Anxiety    Chronic low back pain    DDD  (degenerative disc disease), lumbar    ED (erectile dysfunction)    Finger injury    cut pads off 3rd and 4th finger left hand/due to lawn mower accident   GERD (gastroesophageal reflux disease)    Heart murmur    as child only-    History of kidney stones    HTN (hypertension) 08/20/2016   Hyperlipidemia    Incomplete right bundle branch block    Prostate cancer ([place]) dx 10/02/14   stage T1c   Sigmoid diverticulosis    Small cell lung cancer ([place]) 03/2020   Wears glasses     Past Surgical History:  Procedure Laterality Date   BRONCHIAL BIOPSY  04/12/2020   Procedure: BRONCHIAL BIOPSIES;  Surgeon: Marshell Garfinkel, MD;  Location: [facility];  Service: Cardiopulmonary;;   BRONCHIAL BRUSHINGS  04/12/2020   Procedure: BRONCHIAL BRUSHINGS;  Surgeon: Marshell Garfinkel, MD;  Location: [facility];  Service: Cardiopulmonary;;   BRONCHIAL NEEDLE ASPIRATION BIOPSY  04/12/2020   Procedure: BRONCHIAL NEEDLE ASPIRATION BIOPSIES;  Surgeon: Marshell Garfinkel, MD;  Location: [facility];  Service: Cardiopulmonary;;   BRONCHIAL WASHINGS  04/12/2020   Procedure: BRONCHIAL WASHINGS;  Surgeon: Marshell Garfinkel, MD;  Location: [facility];  Service: Cardiopulmonary;;   COLONOSCOPY     COLONOSCOPY W/ POLYPECTOMY  04-19-2012   ENDOBRONCHIAL ULTRASOUND N/A 04/12/2020   Procedure: ENDOBRONCHIAL ULTRASOUND;  Surgeon: Marshell Garfinkel, MD;  Location: [facility];  Service: Cardiopulmonary;  Laterality: N/A;   ESOPHAGOGASTRODUODENOSCOPY  08-05-2010   HEMOSTASIS CONTROL  04/12/2020   Procedure: HEMOSTASIS CONTROL;  Surgeon: Marshell Garfinkel, MD;  Location: [facility];  Service: Cardiopulmonary;;   IR IMAGING GUIDED PORT INSERTION  05/31/2020   POLYPECTOMY     PROSTATE BIOPSY  10/02/14   RADIOACTIVE SEED IMPLANT N/A 01/30/2015   Procedure: RADIOACTIVE SEED IMPLANT;  Surgeon: Rana Snare, MD;  Location[facility];  Service: Urology;  Laterality: N/A;   UPPER GASTROINTESTINAL ENDOSCOPY  04/03/2021    VIDEO BRONCHOSCOPY N/A 04/12/2020   Procedure: VIDEO BRONCHOSCOPY WITHOUT FLUORO;  Surgeon: Marshell Garfinkel, MD;  Location: [facility];  Service: Cardiopulmonary;  Laterality: N/A;     reports that he has been smoking cigarettes. He has a 12.50 pack-year smoking history. He has never used smokeless tobacco. He reports current alcohol use of about 49.0 standard drinks of alcohol per week. He reports that he does not use drugs.  Allergies  Allergen Reactions   Bee Venom Swelling   Elemental Sulfur Other (See Comments)    Acute renal failure   Other Other (See Comments)    Chemo medicine- patient not sure about name - swelling & rashes   Sulfa Antibiotics    Namenda [Memantine] Nausea Only    Nausea and fatigue    Family History  Problem Relation Age of Onset   Prostate cancer Father    Heart disease Father    Heart disease Mother    Breast cancer Sister    Heart disease Other    Colon cancer Neg Hx    Colon polyps Neg Hx    Esophageal cancer Neg Hx    Rectal cancer Neg Hx    Stomach cancer Neg Hx       Prior to Admission medications   Medication Sig Start Date End Date Taking? Authorizing Provider  acetaminophen (TYLENOL) 325 MG tablet Take 2 tablets (650 mg total) by mouth every 6 (six) hours as needed for mild pain (or Fever >/= 101). 04/12/20  Yes Regalado, Belkys A, MD  aspirin EC 81 MG tablet Take 81 mg by mouth daily. Swallow whole.   Yes [provider]  BREZTRI AEROSPHERE 160-9-4.8 MCG/ACT AERO INHALE 2 PUFFS INTO THE LUNGS IN THE MORNING AND AT BEDTIME. 03/14/21  Yes Mannam, Praveen, MD  demeclocycline (DECLOMYCIN) 300 MG tablet Take 1 tablet (300 mg total) by mouth 2 (two) times daily. 05/29/21  Yes Heilingoetter, Cassandra L, PA-C  famotidine (PEPCID) 20 MG tablet Take 1 tablet (20 mg total) by mouth 2 (two) times daily. 05/09/21  Yes [name], MD  Multiple Vitamin (MULTIVITAMIN) capsule Take 1 capsule by mouth daily.   Yes [provider]   ondansetron (ZOFRAN-ODT) 8 MG disintegrating tablet Take 1 tablet (8 mg total) by mouth every 8 (eight) hours as needed for nausea or vomiting. 03/14/21  Yes Heilingoetter, Cassandra L, PA-C  prochlorperazine (COMPAZINE) 10 MG tablet Take 1 tablet (10 mg total) by mouth every 6 (six) hours as needed. 11/25/20  Yes Heilingoetter, Cassandra L, PA-C  traMADol (ULTRAM) 50 MG tablet TAKE 1/2 TABLET BY MOUTH TWICE A DAY AS NEEDED FOR PAIN 05/19/21  Yes [name], MD  sodium bicarbonate 650 MG tablet Take 1 tablet (650 mg total) by mouth 2 (two) times daily for 7 days. Patient not taking: Reported on 06/03/2021 05/30/21 06/06/21  Heilingoetter, Cassandra L, PA-C    Physical Exam: Vitals:   06/03/21 1404 06/03/21 1448 06/03/21 1500 06/03/21 1530  BP: 126/81 128/80 127/83 134/80  Pulse: 86 79 78 78  Resp: 20 18 16 18   Temp: 98.1 F (36.7 C)     TempSrc: Oral  SpO2: 100% 100% 100% 100%  Weight:      Height:         Vitals:   06/03/21 1404 06/03/21 1448 06/03/21 1500 06/03/21 1530  BP: 126/81 128/80 127/83 134/80  Pulse: 86 79 78 78  Resp: 20 18 16 18   Temp: 98.1 F (36.7 C)     TempSrc: Oral     SpO2: 100% 100% 100% 100%  Weight:      Height:          Constitutional: Alert and oriented x 3 . Not in any apparent distress.  Chronically ill-appearing HEENT:      Head: Normocephalic and atraumatic.         Eyes: PERLA, EOMI, Conjunctivae are normal. Sclera is non-icteric.       Mouth/Throat: Mucous membranes are moist.       Neck: Supple with no signs of meningismus. Cardiovascular: Regular rate and rhythm. No murmurs, gallops, or rubs. 2+ symmetrical distal pulses are present . No JVD. No LE edema Respiratory: Respiratory effort normal .Lungs sounds clear bilaterally. No wheezes, crackles, or rhonchi.  Gastrointestinal: Soft, non tender, and non distended with positive bowel sounds.  Genitourinary: No CVA tenderness. Musculoskeletal: Nontender with normal range of motion in all  extremities. No cyanosis, or erythema of extremities. Neurologic:  Face is symmetric. Moving all extremities. No gross focal neurologic deficits . Skin: Skin is warm, dry.  No rash or ulcers Psychiatric: Mood and affect are normal    Labs on Admission: I have personally reviewed following labs and imaging studies  CBC: Recent Labs  Lab 05/29/21 0741 06/03/21 1002  WBC 4.7 5.6  NEUTROABS 3.3 4.4  HGB 11.3* 11.9*  HCT 30.1* 31.7*  MCV 94.4 94.3  PLT 263 292   Basic Metabolic Panel: Recent Labs  Lab 05/29/21 0741 06/03/21 1002 06/03/21 1422  NA 118* 115* 114*  K 3.9 4.4  --   CL 84* 81*  --   CO2 26 25  --   GLUCOSE 102* 103*  --   BUN 5* 6  --   CREATININE 0.70 0.75  --   CALCIUM 9.4 9.7  --   MG  --   --  1.5*   GFR: Estimated Creatinine Clearance: 98.4 mL/min (by C-G formula based on SCr of 0.75 mg/dL). Liver Function Tests: Recent Labs  Lab 05/29/21 0741 06/03/21 1002  AST 27 36  ALT 23 26  ALKPHOS 86 95  BILITOT 0.4 0.5  PROT 7.3 7.3  ALBUMIN 3.6 3.7   No results for input(s): LIPASE, AMYLASE in the last 168 hours. No results for input(s): AMMONIA in the last 168 hours. Coagulation Profile: No results for input(s): INR, PROTIME in the last 168 hours. Cardiac Enzymes: No results for input(s): CKTOTAL, CKMB, CKMBINDEX, TROPONINI in the last 168 hours. BNP (last 3 results) No results for input(s): PROBNP in the last 8760 hours. HbA1C: No results for input(s): HGBA1C in the last 72 hours. CBG: No results for input(s): GLUCAP in the last 168 hours. Lipid Profile: No results for input(s): CHOL, HDL, LDLCALC, TRIG, CHOLHDL, LDLDIRECT in the last 72 hours. Thyroid Function Tests: No results for input(s): TSH, T4TOTAL, FREET4, T3FREE, THYROIDAB in the last 72 hours. Anemia Panel: No results for input(s): VITAMINB12, FOLATE, FERRITIN, TIBC, IRON, RETICCTPCT in the last 72 hours. Urine analysis:    Component Value Date/Time   COLORURINE YELLOW (A)  06/03/2021 1423   APPEARANCEUR CLEAR (A) 06/03/2021 1423   LABSPEC 1.011 06/03/2021 1423  PHURINE 6.0 06/03/2021 1423   GLUCOSEU 50 (A) 06/03/2021 [place] 12/12/2019 [place] 06/03/2021 [place] 06/03/2021 [place] 06/03/2021 1423   PROTEINUR NEGATIVE 06/03/2021 1423   UROBILINOGEN 0.2 12/12/2019 1506   NITRITE NEGATIVE 06/03/2021 [place] 06/03/2021 1423    Radiological Exams on Admission: No results found.   Assessment/Plan Principal Problem:   Hyponatremia Active Problems:   Tobacco abuse   Alcohol abuse   HTN (hypertension)   Small cell lung cancer, left upper lobe (HCC)   SIADH (syndrome of inappropriate ADH production) (HCC)    Hyponatremia Most likely secondary to SIADH from patient's known small cell lung cancer Serum osmolality is 233 and urine sodium level is 39 Patient was placed on demeclocycline by oncology but has not started taking it Appreciate nephrology input, start patient on 3% saline Monitor electrolytes Patient will need to be placed on fluid restriction     Nicotine dependence Patient declines a nicotine patch at this time   History of alcohol dependence Denies having symptoms of alcohol withdrawal when he does not drink We will place patient on CIWA protocol and administer lorazepam for CIWA score of 8 or greater     Small cell lung cancer, left upper lobe With metastatic disease to the brain and adrenal gland Follow-up with oncology upon discharge   DVT prophylaxis: Lovenox  Code Status: DO NOT RESUSCITATE Family Communication: Greater than 50% of time was spent discussing plan of care with patient at the bedside.  All questions and concerns have been addressed.  He verbalizes understanding and agrees with the plan. Disposition Plan: Back to previous home environment Consults called: Oncology/nephrology Status: At the time of admission, it  appears that the appropriate admission status for this patient is inpatient. This is judged to be reasonable and necessary in order to provide the required intensity of service to ensure the patient's safety given the presenting symptoms, physical exam findings, and initial radiographic and laboratory data in the context of their comorbid conditions. Patient requires inpatient status due to high intensity of service, high risk for further deterioration and high frequency of surveillance required    Collier Bullock MD [facility]     06/03/2021, 5:07 PM

## 2021-06-03 NOTE — Telephone Encounter (Signed)
Pts sodium is crititcally low. Per Dr. Julien Nordmann and Vito Backers, PA-C, pt needs to co to the ER as soon as possible.  I have LM for pts wife advising of this. I have also spoken to the pt directly and advised of this. He states his wife left to go to the grocery store and will try to reach her on her cell phone.  Pt has been advised not to delay getting to the ER and this is extremely important. Pt expressed understanding of this information.

## 2021-06-03 NOTE — Consult Note (Signed)
[facility]  CONSULT NOTE    Date: 06/03/2021                  Patient Name:  Richard Santos  MRN: [id number]  DOB: January 09, 1961  Age / Sex: 60 y.o., male         PCP: [name], MD                 Service Requesting Consult: Dr. Francine Graven                 Reason for Consult: Hyponatremia            History of Present Illness: [name] asked to present to the ED after the finding of serum sodium of 115. 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 He complains of nausea and constipation. He is not keeping a fluid restriction. Patient's wife is at bedside who assists with history taking. Patient was prescribed demeclocycline by oncology for his hyponatremia but he has not started taking this.   Patient states he was not told to keep a fluid restriction. Patient states he did try sodium bicarbonate but that gave him nausea. He denies being tried sodium chloride by mouth.     Medications: Outpatient medications: (Not in a hospital admission)   Current medications: Current Facility-Administered Medications  Medication Dose Route Frequency Provider Last Rate Last Admin   magnesium sulfate IVPB 2 g 50 mL  2 g Intravenous Once Lucrezia Starch, MD 50 mL/hr at 06/03/21 1526 2 g at 06/03/21 1526   Current Outpatient Medications  Medication Sig Dispense Refill   acetaminophen (TYLENOL) 325 MG tablet Take 2 tablets (650 mg total) by mouth every 6 (six) hours as needed for mild pain (or Fever >/= 101). 30 tablet 0   aspirin EC 81 MG tablet Take 81 mg by mouth daily. Swallow whole.     BREZTRI AEROSPHERE 160-9-4.8 MCG/ACT AERO INHALE 2 PUFFS INTO THE LUNGS IN THE MORNING AND AT BEDTIME. 10.7 g 10   demeclocycline (DECLOMYCIN) 300 MG tablet Take 1 tablet (300 mg total) by mouth 2 (two) times daily. 60 tablet 0   famotidine (PEPCID) 20 MG tablet Take 1 tablet (20 mg total) by mouth 2 (two) times daily. 60 tablet 2   Multiple Vitamin  (MULTIVITAMIN) capsule Take 1 capsule by mouth daily.     ondansetron (ZOFRAN-ODT) 8 MG disintegrating tablet Take 1 tablet (8 mg total) by mouth every 8 (eight) hours as needed for nausea or vomiting. 30 tablet 2   prochlorperazine (COMPAZINE) 10 MG tablet Take 1 tablet (10 mg total) by mouth every 6 (six) hours as needed. 30 tablet 2   traMADol (ULTRAM) 50 MG tablet TAKE 1/2 TABLET BY MOUTH TWICE A DAY AS NEEDED FOR PAIN 60 tablet 2   sodium bicarbonate 650 MG tablet Take 1 tablet (650 mg total) by mouth 2 (two) times daily for 7 days. (Patient not taking: Reported on 06/03/2021) 14 tablet 0      Allergies: Allergies  Allergen Reactions   Bee Venom Swelling   Elemental Sulfur Other (See Comments)    Acute renal failure   Other Other (See Comments)    Chemo medicine- patient not sure about name - swelling & rashes   Sulfa Antibiotics    Namenda [Memantine] Nausea Only    Nausea and fatigue      Past Medical History: Past Medical History:  Diagnosis Date  Allergic rhinitis    Allergy    Anxiety    Chronic low back pain    DDD (degenerative disc disease), lumbar    ED (erectile dysfunction)    Finger injury    cut pads off 3rd and 4th finger left hand/due to lawn mower accident   GERD (gastroesophageal reflux disease)    Heart murmur    as child only-    History of kidney stones    HTN (hypertension) 08/20/2016   Hyperlipidemia    Incomplete right bundle branch block    Prostate cancer ([place]) dx 10/02/14   stage T1c   Sigmoid diverticulosis    Small cell lung cancer ([place]) 03/2020   Wears glasses      Past Surgical History: Past Surgical History:  Procedure Laterality Date   BRONCHIAL BIOPSY  04/12/2020   Procedure: BRONCHIAL BIOPSIES;  Surgeon: Marshell Garfinkel, MD;  Location: [place];  Service: Cardiopulmonary;;   BRONCHIAL BRUSHINGS  04/12/2020   Procedure: BRONCHIAL BRUSHINGS;  Surgeon: Marshell Garfinkel, MD;  Location: [facility];  Service: Cardiopulmonary;;    BRONCHIAL NEEDLE ASPIRATION BIOPSY  04/12/2020   Procedure: BRONCHIAL NEEDLE ASPIRATION BIOPSIES;  Surgeon: Marshell Garfinkel, MD;  Location: [place];  Service: Cardiopulmonary;;   BRONCHIAL WASHINGS  04/12/2020   Procedure: BRONCHIAL WASHINGS;  Surgeon: Marshell Garfinkel, MD;  Location: [facility];  Service: Cardiopulmonary;;   COLONOSCOPY     COLONOSCOPY W/ POLYPECTOMY  04-19-2012   ENDOBRONCHIAL ULTRASOUND N/A 04/12/2020   Procedure: ENDOBRONCHIAL ULTRASOUND;  Surgeon: Marshell Garfinkel, MD;  Location: [facility];  Service: Cardiopulmonary;  Laterality: N/A;   ESOPHAGOGASTRODUODENOSCOPY  08-05-2010   HEMOSTASIS CONTROL  04/12/2020   Procedure: HEMOSTASIS CONTROL;  Surgeon: Marshell Garfinkel, MD;  Location: [place];  Service: Cardiopulmonary;;   IR IMAGING GUIDED PORT INSERTION  05/31/2020   POLYPECTOMY     PROSTATE BIOPSY  10/02/14   RADIOACTIVE SEED IMPLANT N/A 01/30/2015   Procedure: RADIOACTIVE SEED IMPLANT;  Surgeon: Rana Snare, MD;  Location: [facility];  Service: Urology;  Laterality: N/A;   UPPER GASTROINTESTINAL ENDOSCOPY  04/03/2021   VIDEO BRONCHOSCOPY N/A 04/12/2020   Procedure: VIDEO BRONCHOSCOPY WITHOUT FLUORO;  Surgeon: Marshell Garfinkel, MD;  Location: [place];  Service: Cardiopulmonary;  Laterality: N/A;     Family History: Family History  Problem Relation Age of Onset   Prostate cancer Father    Heart disease Father    Heart disease Mother    Breast cancer Sister    Heart disease Other    Colon cancer Neg Hx    Colon polyps Neg Hx    Esophageal cancer Neg Hx    Rectal cancer Neg Hx    Stomach cancer Neg Hx      Social History: Social History   Socioeconomic History   Marital status: Married    Spouse name: Not on file   Number of children: 0   Years of education: Not on file   Highest education level: Not on file  Occupational History   Occupation: Counselling psychologist: [employer]  Tobacco Use   Smoking  status: Every Day    Packs/day: 0.50    Years: 25.00    Pack years: 12.50    Types: Cigarettes   Smokeless tobacco: Never   Tobacco comments:    smokes 1/2 pack per day 02/11/2021  Vaping Use   Vaping Use: Former  Substance and Sexual Activity   Alcohol use: Yes    Alcohol/week: 49.0 standard drinks  Types: 25 Cans of beer, 24 Standard drinks or equivalent per week    Comment: 5 beer per day   Drug use: No   Sexual activity: Not on file  Other Topics Concern   Not on file  Social History Narrative   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 He is married with no biological children.   Social Determinants of Health   Financial Resource Strain: Not on file  Food Insecurity: Not on file  Transportation Needs: Not on file  Physical Activity: Not on file  Stress: Not on file  Social Connections: Not on file  Intimate Partner Violence: Not on file     Review of Systems: Review of Systems  Constitutional: Negative.   HENT: Negative.    Eyes: Negative.   Respiratory:  Negative for cough, hemoptysis, sputum production, shortness of breath and wheezing.   Cardiovascular:  Negative for chest pain, palpitations, orthopnea, claudication, leg swelling and PND.  Gastrointestinal: Negative.   Genitourinary:  Positive for urgency. Negative for dysuria, flank pain, frequency and hematuria.  Musculoskeletal:  Negative for back pain, falls, joint pain, myalgias and neck pain.  Skin: Negative.   Neurological: Negative.   Endo/Heme/Allergies: Negative.   Psychiatric/Behavioral: Negative.     Vital Signs: Blood pressure 134/80, pulse 78, temperature 98.1 F (36.7 C), temperature source Oral, resp. rate 18, height 6' (1.829 m), weight 70 kg, SpO2 100 %.  Weight trends: Filed Weights   06/03/21 1348  Weight: 70 kg    Physical Exam: General: NAD,   Head: Normocephalic, atraumatic. Moist oral mucosal membranes  Eyes: Anicteric, PERRL  Neck: Supple,  trachea midline  Lungs:  Clear to auscultation  Heart: Regular rate and rhythm  Abdomen:  Soft, nontender,   Extremities:  no peripheral edema.  Neurologic: Nonfocal, moving all four extremities  Skin: No lesions         Lab results: Basic Metabolic Panel: Recent Labs  Lab 05/29/21 0741 06/03/21 1002 06/03/21 1422  NA 118* 115*  --   K 3.9 4.4  --   CL 84* 81*  --   CO2 26 25  --   GLUCOSE 102* 103*  --   BUN 5* 6  --   CREATININE 0.70 0.75  --   CALCIUM 9.4 9.7  --   MG  --   --  1.5*    Liver Function Tests: Recent Labs  Lab 05/29/21 0741 06/03/21 1002  AST 27 36  ALT 23 26  ALKPHOS 86 95  BILITOT 0.4 0.5  PROT 7.3 7.3  ALBUMIN 3.6 3.7   No results for input(s): LIPASE, AMYLASE in the last 168 hours. No results for input(s): AMMONIA in the last 168 hours.  CBC: Recent Labs  Lab 05/29/21 0741 06/03/21 1002  WBC 4.7 5.6  NEUTROABS 3.3 4.4  HGB 11.3* 11.9*  HCT 30.1* 31.7*  MCV 94.4 94.3  PLT 263 245    Cardiac Enzymes: No results for input(s): CKTOTAL, CKMB, CKMBINDEX, TROPONINI in the last 168 hours.  BNP: Invalid input(s): POCBNP  CBG: No results for input(s): GLUCAP in the last 168 hours.  Microbiology: Results for orders placed or performed during the hospital encounter of 06/03/21  Resp Panel by RT-PCR (Flu A&B, Covid) Nasopharyngeal Swab     Status: None   Collection Time: 06/03/21  2:23 PM   Specimen: Nasopharyngeal Swab; Nasopharyngeal(NP) swabs in vial transport medium  Result Value Ref Range Status   SARS Coronavirus 2 by RT PCR  NEGATIVE NEGATIVE Final    Comment: (NOTE) SARS-CoV-2 target nucleic acids are NOT DETECTED.  The SARS-CoV-2 RNA is generally detectable in upper respiratory specimens during the acute phase of infection. The lowest concentration of SARS-CoV-2 viral copies this assay can detect is 138 copies/mL. A negative result does not preclude SARS-Cov-2 infection and should not be used as the sole basis for  treatment or other patient management decisions. A negative result may occur with  improper specimen collection/handling, submission of specimen other than nasopharyngeal swab, presence of viral mutation(s) within the areas targeted by this assay, and inadequate number of viral copies(<138 copies/mL). A negative result must be combined with clinical observations, patient history, and epidemiological information. The expected result is Negative.  Fact Sheet for Patients:  EntrepreneurPulse.com.au  Fact Sheet for Healthcare Providers:  IncredibleEmployment.be  This test is no t yet approved or cleared by the [place] FDA and  has been authorized for detection and/or diagnosis of SARS-CoV-2 by FDA under an Emergency Use Authorization (EUA). This EUA will remain  in effect (meaning this test can be used) for the duration of the COVID-19 declaration under Section 564(b)(1) of the Act, 21 U.S.C.section 360bbb-3(b)(1), unless the authorization is terminated  or revoked sooner.       Influenza A by PCR NEGATIVE NEGATIVE Final   Influenza B by PCR NEGATIVE NEGATIVE Final    Comment: (NOTE) The Xpert Xpress SARS-CoV-2/FLU/RSV plus assay is intended as an aid in the diagnosis of influenza from Nasopharyngeal swab specimens and should not be used as a sole basis for treatment. Nasal washings and aspirates are unacceptable for Xpert Xpress SARS-CoV-2/FLU/RSV testing.  Fact Sheet for Patients: EntrepreneurPulse.com.au  Fact Sheet for Healthcare Providers: IncredibleEmployment.be  This test is not yet approved or cleared by the [place] FDA and has been authorized for detection and/or diagnosis of SARS-CoV-2 by FDA under an Emergency Use Authorization (EUA). This EUA will remain in effect (meaning this test can be used) for the duration of the COVID-19 declaration under Section 564(b)(1) of the Act, 21  U.S.C. section 360bbb-3(b)(1), unless the authorization is terminated or revoked.  Performed at [facility], [address] [place], [place] [address]     Coagulation Studies: No results for input(s): LABPROT, INR in the last 72 hours.  Urinalysis: Recent Labs    06/03/21 1423  COLORURINE YELLOW*  LABSPEC 1.011  PHURINE 6.0  GLUCOSEU 50*  HGBUR NEGATIVE  BILIRUBINUR NEGATIVE  KETONESUR NEGATIVE  PROTEINUR NEGATIVE  NITRITE NEGATIVE  LEUKOCYTESUR NEGATIVE      Imaging: No results found.   Assessment & Plan: [name] is a 60 y.o. white male with small cell carcinoma with metastasis, hypertension, hyperlipidemia, GERD, prostate cancer, who was admitted to [facility] on 06/03/2021 for Hyponatremia [E87.1]  Hyponatremia SIADH Hypertension  Plan: due to severity of patient's serum sodium level. Will recommend hypertonic saline until serum sodium is above 120. Then place on fluid restriction and salt tablets. If no improvement, consider tolvaptan.       LOS: 0 Huzaifa Viney 7/12/20224:14 PM

## 2021-06-03 NOTE — ED Notes (Signed)
Seizure pads in place. Stretcher locked in lowest position. Call bell in reach

## 2021-06-03 NOTE — ED Provider Notes (Signed)
[facility] Emergency Department Provider Note  ____________________________________________   Event Date/Time   First MD Initiated Contact with Patient 06/03/21 1408     (approximate)  I have reviewed the triage vital signs and the nursing notes.   HISTORY  Chief Complaint No chief complaint on file.   HPI Richard Santos is a 60 y.o. 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  Patient is getting immunotherapy and radiation therapy, recent blood work shows sodium 115 compared to 118 5 days ago.  He does have known brain and adrenal mets from his disease.  Patient was recently hospitalized for hyponatremia with concerns for SIADH 6/28-7/1.  He states that since then he has had some nausea and some constipation.  He has not had any significant vomiting or diarrhea or urinary symptoms.  He states he was never told to have fluid restriction and has been drinking Powerade's and body armor and trying to drink less water.  He has no new headache, earache, sore throat, chest pain, cough, shortness of breath, abdominal pain back pain rash or new extremity pain.  Denies any other acute concerns at this time.  No history of recent falls or any seizures.         Past Medical History:  Diagnosis Date   Allergic rhinitis    Allergy    Anxiety    Chronic low back pain    DDD (degenerative disc disease), lumbar    ED (erectile dysfunction)    Finger injury    cut pads off 3rd and 4th finger left hand/due to lawn mower accident   GERD (gastroesophageal reflux disease)    Heart murmur    as child only-    History of kidney stones    HTN (hypertension) 08/20/2016    Hyperlipidemia    Incomplete right bundle branch block    Prostate cancer ([place]) dx 10/02/14   stage T1c   Sigmoid diverticulosis    Small cell lung cancer ([place]) 03/2020   Wears glasses     Patient Active Problem List   Diagnosis Date Noted   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   Acute hyponatremia 05/21/2021   Hyponatremia 05/20/2021   Metastatic cancer to brain ([place]) 09/05/2020   Port-A-Cath in place 06/03/2020   Emphysema of lung ([place]) 04/26/2020   Small cell lung cancer, left upper lobe ([place]) 04/18/2020   Encounter for antineoplastic chemotherapy 04/18/2020   Encounter for antineoplastic immunotherapy 04/18/2020   Goals of care, counseling/discussion 04/18/2020   Syncope 04/11/2020   Lung mass 04/11/2020   History of prostate cancer 04/11/2020   Chest pain 04/03/2020   Vitamin D deficiency 12/15/2019   Dysphagia 07/17/2017   HTN (hypertension) 08/20/2016   Malignant neoplasm of prostate ([place]) 11/29/2014   Bee sting allergy 07/31/2014   Elevated PSA 04/14/2013   Lumbar disc disease 04/14/2013   Personal history of colonic adenoma 04/26/2012   Impaired glucose tolerance 02/27/2012   Encounter for well adult exam with abnormal findings 02/27/2012   Chronic low back pain    Alcohol abuse 12/31/2010   Functional Dyspepsia 03/11/2009   Hyperlipidemia 01/16/2008   Anxiety state 01/16/2008   ERECTILE DYSFUNCTION 01/16/2008   Tobacco abuse  01/16/2008   ALLERGIC RHINITIS 01/16/2008   GERD 01/16/2008    Past Surgical History:  Procedure Laterality Date   BRONCHIAL BIOPSY  04/12/2020   Procedure: BRONCHIAL BIOPSIES;  Surgeon: Marshell Garfinkel, MD;  Location: [place];  Service: Cardiopulmonary;;   BRONCHIAL BRUSHINGS  04/12/2020   Procedure: BRONCHIAL BRUSHINGS;  Surgeon: Marshell Garfinkel, MD;  Location: [place];  Service: Cardiopulmonary;;   BRONCHIAL NEEDLE ASPIRATION BIOPSY  04/12/2020   Procedure: BRONCHIAL NEEDLE ASPIRATION BIOPSIES;   Surgeon: Marshell Garfinkel, MD;  Location: [place];  Service: Cardiopulmonary;;   BRONCHIAL WASHINGS  04/12/2020   Procedure: BRONCHIAL WASHINGS;  Surgeon: Marshell Garfinkel, MD;  Location: [place];  Service: Cardiopulmonary;;   COLONOSCOPY     COLONOSCOPY W/ POLYPECTOMY  04-19-2012   ENDOBRONCHIAL ULTRASOUND N/A 04/12/2020   Procedure: ENDOBRONCHIAL ULTRASOUND;  Surgeon: Marshell Garfinkel, MD;  Location: [place];  Service: Cardiopulmonary;  Laterality: N/A;   ESOPHAGOGASTRODUODENOSCOPY  08-05-2010   HEMOSTASIS CONTROL  04/12/2020   Procedure: HEMOSTASIS CONTROL;  Surgeon: Marshell Garfinkel, MD;  Location: [place];  Service: Cardiopulmonary;;   IR IMAGING GUIDED PORT INSERTION  05/31/2020   POLYPECTOMY     PROSTATE BIOPSY  10/02/14   RADIOACTIVE SEED IMPLANT N/A 01/30/2015   Procedure: RADIOACTIVE SEED IMPLANT;  Surgeon: Rana Snare, MD;  Location: [facility];  Service: Urology;  Laterality: N/A;   UPPER GASTROINTESTINAL ENDOSCOPY  04/03/2021   VIDEO BRONCHOSCOPY N/A 04/12/2020   Procedure: VIDEO BRONCHOSCOPY WITHOUT FLUORO;  Surgeon: Marshell Garfinkel, MD;  Location: [place];  Service: Cardiopulmonary;  Laterality: N/A;    Prior to Admission medications   Medication Sig Start Date End Date Taking? Authorizing Provider  acetaminophen (TYLENOL) 325 MG tablet Take 2 tablets (650 mg total) by mouth every 6 (six) hours as needed for mild pain (or Fever >/= 101). 04/12/20  Yes Regalado, Belkys A, MD  aspirin EC 81 MG tablet Take 81 mg by mouth daily. Swallow whole.   Yes [provider]  BREZTRI AEROSPHERE 160-9-4.8 MCG/ACT AERO INHALE 2 PUFFS INTO THE LUNGS IN THE MORNING AND AT BEDTIME. 03/14/21  Yes Mannam, Praveen, MD  demeclocycline (DECLOMYCIN) 300 MG tablet Take 1 tablet (300 mg total) by mouth 2 (two) times daily. 05/29/21  Yes Heilingoetter, Cassandra L, PA-C  famotidine (PEPCID) 20 MG tablet Take 1 tablet (20 mg total) by mouth 2 (two) times daily. 05/09/21   Yes [name], MD  Multiple Vitamin (MULTIVITAMIN) capsule Take 1 capsule by mouth daily.   Yes [provider]  ondansetron (ZOFRAN-ODT) 8 MG disintegrating tablet Take 1 tablet (8 mg total) by mouth every 8 (eight) hours as needed for nausea or vomiting. 03/14/21  Yes Heilingoetter, Cassandra L, PA-C  prochlorperazine (COMPAZINE) 10 MG tablet Take 1 tablet (10 mg total) by mouth every 6 (six) hours as needed. 11/25/20  Yes Heilingoetter, Cassandra L, PA-C  traMADol (ULTRAM) 50 MG tablet TAKE 1/2 TABLET BY MOUTH TWICE A DAY AS NEEDED FOR PAIN 05/19/21  Yes [name], MD  sodium bicarbonate 650 MG tablet Take 1 tablet (650 mg total) by mouth 2 (two) times daily for 7 days. Patient not taking: Reported on 06/03/2021 05/30/21 06/06/21  Heilingoetter, Cassandra L, PA-C    Allergies Bee venom, Elemental sulfur, Other, Sulfa antibiotics, and Namenda [memantine]  Family History  Problem Relation Age of Onset   Prostate cancer Father    Heart disease Father    Heart disease Mother    Breast cancer Sister    Heart  disease Other    Colon cancer Neg Hx    Colon polyps Neg Hx    Esophageal cancer Neg Hx    Rectal cancer Neg Hx    Stomach cancer Neg Hx     Social History Social History   Tobacco Use   Smoking status: Every Day    Packs/day: 0.50    Years: 25.00    Pack years: 12.50    Types: Cigarettes   Smokeless tobacco: Never   Tobacco comments:    smokes 1/2 pack per day 02/11/2021  Vaping Use   Vaping Use: Former  Substance Use Topics   Alcohol use: Yes    Alcohol/week: 49.0 standard drinks    Types: 25 Cans of beer, 24 Standard drinks or equivalent per week    Comment: 5 beer per day   Drug use: No    Review of Systems  Review of Systems  Constitutional:  Negative for chills and fever.  HENT:  Negative for sore throat.   Eyes:  Negative for pain.  Respiratory:  Negative for cough and stridor.   Cardiovascular:  Negative for chest pain.  Gastrointestinal:   Positive for constipation and nausea. Negative for vomiting.  Genitourinary:  Negative for dysuria.  Musculoskeletal:  Negative for myalgias.  Skin:  Negative for rash.  Neurological:  Positive for weakness. Negative for seizures, loss of consciousness and headaches.  Psychiatric/Behavioral:  Negative for suicidal ideas.   All other systems reviewed and are negative.    ____________________________________________   PHYSICAL EXAM:  VITAL SIGNS: ED Triage Vitals  Enc Vitals Group     BP 06/03/21 1404 126/81     Pulse Rate 06/03/21 1404 86     Resp 06/03/21 1404 20     Temp 06/03/21 1404 98.1 F (36.7 C)     Temp Source 06/03/21 1404 Oral     SpO2 06/03/21 1404 100 %     Weight 06/03/21 1348 154 lb 4.8 oz (70 kg)     Height 06/03/21 1348 6' (1.829 m)     Head Circumference --      Peak Flow --      Pain Score 06/03/21 1348 0     Pain Loc --      Pain Edu? --      Excl. in [place]? --    Vitals:   06/03/21 1500 06/03/21 1530  BP: 127/83 134/80  Pulse: 78 78  Resp: 16 18  Temp:    SpO2: 100% 100%   Physical Exam Vitals and nursing note reviewed.  Constitutional:      Appearance: He is well-developed.  HENT:     Head: Normocephalic and atraumatic.     Right Ear: External ear normal.     Left Ear: External ear normal.     Nose: Nose normal.  Eyes:     Conjunctiva/sclera: Conjunctivae normal.  Cardiovascular:     Rate and Rhythm: Normal rate and regular rhythm.     Heart sounds: No murmur heard. Pulmonary:     Effort: Pulmonary effort is normal. No respiratory distress.     Breath sounds: Normal breath sounds.  Abdominal:     Palpations: Abdomen is soft.     Tenderness: There is no abdominal tenderness.  Musculoskeletal:     Cervical back: Neck supple.  Skin:    General: Skin is warm and dry.  Neurological:     Mental Status: He is alert and oriented to person, place, and time.  Psychiatric:  Mood and Affect: Mood normal.    PERRLA.  EOMI.  Patient is  oriented x4. ____________________________________________   LABS (all labs ordered are listed, but only abnormal results are displayed)  Labs Reviewed  URINALYSIS, COMPLETE (UACMP) WITH MICROSCOPIC - Abnormal; Notable for the following components:      Result Value   Color, Urine YELLOW (*)    APPearance CLEAR (*)    Glucose, UA 50 (*)    All other components within normal limits  MAGNESIUM - Abnormal; Notable for the following components:   Magnesium 1.5 (*)    All other components within normal limits  RESP PANEL BY RT-PCR (FLU A&B, COVID) ARPGX2  SODIUM, URINE, RANDOM  OSMOLALITY  OSMOLALITY, URINE   ____________________________________________  EKG  ____________________________________________  RADIOLOGY  ED MD interpretation:    Official radiology report(s): No results found.  ____________________________________________   PROCEDURES  Procedure(s) performed (including Critical Care):  .Critical Care  Date/Time: 06/03/2021 4:09 PM Performed by: Lucrezia Starch, MD Authorized by: Lucrezia Starch, MD   Critical care provider statement:    Critical care time (minutes):  45   Critical care was necessary to treat or prevent imminent or life-threatening deterioration of the following conditions:  Metabolic crisis   Critical care was time spent personally by me on the following activities:  Discussions with consultants, evaluation of patient's response to treatment, examination of patient, ordering and performing treatments and interventions, ordering and review of laboratory studies, ordering and review of radiographic studies, pulse oximetry, re-evaluation of patient's condition, obtaining history from patient or surrogate and review of old charts   ____________________________________________   INITIAL IMPRESSION / [place] / ED COURSE      Patient presents with above-stated history exam after being told to come to the emergency room for low  sodium.  This in the setting of recent hospitalization for same and current chemotherapy treatment for metastatic lung cancer.  He endorses some nausea and constipation but denies any syncope, chest pain, shortness of breath, vomiting or focal deficits and he has not had any seizures.  He has been trying to drink electrolyte containing beverages but has not been restricting his fluids.  He denies any history of recent GI losses and appears relatively euvolemic on exam.  Did review labs drawn earlier today which showed a sodium of 115 compared to 118 5 days ago.  His chloride is also low at 81 without any other significant electrolyte or metabolic derangements noted.  CBC shows no leukocytosis and hemoglobin is at baseline.  Magnesium is low at 1.5 and this was repleted.  Screening COVID influenza test is negative.  UA remarkable for some glucose but otherwise no evidence of infection or blood.  Suspect likely slow downtrend of sodium in the setting of likely SIADH with patient not currently on fluid restriction.  Given he has some constitutional symptoms suspect a nausea we will plan to admit to medicine service for further evaluation and management.      ____________________________________________   FINAL CLINICAL IMPRESSION(S) / ED DIAGNOSES  Final diagnoses:  Hyponatremia  Hypomagnesemia  Metastatic malignant neoplasm, unspecified site [facility])    Medications  magnesium sulfate IVPB 2 g 50 mL (2 g Intravenous New Bag/Given 06/03/21 1526)     ED Discharge Orders     None        Note:  This document was prepared using Dragon voice recognition software and may include unintentional dictation errors.    Lucrezia Starch, MD  06/03/21 1609  

## 2021-06-03 NOTE — ED Notes (Signed)
Dr Francine Graven notified of serum osmolality 233, orders to be placed as needed

## 2021-06-03 NOTE — Progress Notes (Signed)
PHARMACY CONSULT NOTE - FOLLOW UP  Pharmacy Consult for Electrolyte Monitoring and Replacement   Recent Labs: Potassium (mmol/L)  Date Value  06/03/2021 4.4   Magnesium (mg/dL)  Date Value  06/03/2021 1.5 (L)   Calcium (mg/dL)  Date Value  06/03/2021 9.7   Albumin (g/dL)  Date Value  06/03/2021 3.7   Phosphorus (mg/dL)  Date Value  04/12/2020 4.2   Sodium (mmol/L)  Date Value  06/03/2021 118 (LL)     Assessment: 60 year old male with lung cancer send to the ED for low sodium on labs drawn at the [facility]. Patient has h/o hyponatremia with recent admission for the same. Patient is to start on hypertonic saline per Nephrology.  Goal of Therapy:  Increase in Na ~ 8 mEq in 24 hrs  7/12 2227 Na 118 (up from 116 @ 1845)  Plan:  Hypertonic saline continuing to run at 25 ml/hr Continue monitoring Na q4hr Continue to follow closely for any necessary adjustments  Renda Rolls, PharmD, [facility] 06/03/2021 11:50 PM

## 2021-06-04 ENCOUNTER — Ambulatory Visit: Payer: 59

## 2021-06-04 DIAGNOSIS — E871 Hypo-osmolality and hyponatremia: Secondary | ICD-10-CM | POA: Diagnosis not present

## 2021-06-04 DIAGNOSIS — E43 Unspecified severe protein-calorie malnutrition: Secondary | ICD-10-CM | POA: Insufficient documentation

## 2021-06-04 LAB — BASIC METABOLIC PANEL
Anion gap: 8 (ref 5–15)
BUN: 5 mg/dL — ABNORMAL LOW (ref 6–20)
CO2: 26 mmol/L (ref 22–32)
Calcium: 8.9 mg/dL (ref 8.9–10.3)
Chloride: 87 mmol/L — ABNORMAL LOW (ref 98–111)
Creatinine, Ser: 0.62 mg/dL (ref 0.61–1.24)
GFR, Estimated: >60 mL/min (ref >60)
Glucose, Bld: 101 mg/dL — ABNORMAL HIGH (ref 70–99)
Potassium: 3.9 mmol/L (ref 3.5–5.1)
Sodium: 121 mmol/L — ABNORMAL LOW (ref 135–145)

## 2021-06-04 LAB — CBC
HCT: 34.1 % — ABNORMAL LOW (ref 39.0–52.0)
Hemoglobin: 12.5 g/dL — ABNORMAL LOW (ref 13.0–17.0)
MCH: 35.2 pg — ABNORMAL HIGH (ref 26.0–34.0)
MCHC: 36.7 g/dL — ABNORMAL HIGH (ref 30.0–36.0)
MCV: 96.1 fL (ref 80.0–100.0)
Platelets: 210 10*3/uL (ref 150–400)
RBC: 3.55 MIL/uL — ABNORMAL LOW (ref 4.22–5.81)
RDW: 12.5 % (ref 11.5–15.5)
WBC: 5 10*3/uL (ref 4.0–10.5)
nRBC: 0 % (ref 0.0–0.2)

## 2021-06-04 LAB — SODIUM
Sodium: 124 mmol/L — ABNORMAL LOW (ref 135–145)
Sodium: 126 mmol/L — ABNORMAL LOW (ref 135–145)
Sodium: 128 mmol/L — ABNORMAL LOW (ref 135–145)

## 2021-06-04 MED ORDER — BOOST / RESOURCE BREEZE PO LIQD CUSTOM: 1.0000 | Freq: Three times a day (TID) | ORAL | Status: DC

## 2021-06-04 MED ORDER — SODIUM CHLORIDE 1 G PO TABS
1.0000 g | ORAL_TABLET | Freq: Two times a day (BID) | ORAL | Status: DC
  Administered 2021-06-05: 1 g via ORAL
  Filled 2021-06-04 (×3): qty 1

## 2021-06-04 NOTE — Progress Notes (Signed)
[place] for Electrolyte Monitoring and Replacement   Recent Labs: Potassium (mmol/L)  Date Value  06/04/2021 3.9   Magnesium (mg/dL)  Date Value  06/03/2021 1.5 (L)   Calcium (mg/dL)  Date Value  06/04/2021 8.9   Albumin (g/dL)  Date Value  06/03/2021 3.7   Phosphorus (mg/dL)  Date Value  04/12/2020 4.2   Sodium (mmol/L)  Date Value  06/04/2021 121 (L)     Assessment: 60 year old male with lung cancer send to the ED for low sodium on labs drawn at the cancer center. Patient has h/o hyponatremia with recent admission for the same. Patient is to start on hypertonic saline per nephrology.  Holding home demeclocycline  Nephrology following. Plan is to continue hypertonic saline until Na > 125. Consider tolvaptan.  Goal of Therapy:  Increase in Na ~ 8 mEq in 24 hrs  Plan:  Na 115 >> 121 over ~20h (increase of 0.3 mmol/L/hr) Continue hypertonic saline at 25 ml/hr Continue monitoring Na q4hr Continue to follow closely for any necessary adjustments  Benita Gutter  06/04/2021 7:48 AM

## 2021-06-04 NOTE — Progress Notes (Signed)
Initial Nutrition Assessment  DOCUMENTATION CODES:   Severe malnutrition in context of chronic illness  INTERVENTION:   Boost Breeze po TID, each supplement provides 250 kcal and 9 grams of protein  Magic cup TID with meals, each supplement provides 290 kcal and 9 grams of protein  Snacks  MVI po daily   NUTRITION DIAGNOSIS:   Severe Malnutrition related to cancer and cancer related treatments as evidenced by severe fat depletion, severe muscle depletion.  GOAL:   Patient will meet greater than or equal to 90% of their needs  MONITOR:   PO intake, Supplement acceptance, Labs, Weight trends, Skin, I & O's  REASON FOR ASSESSMENT:   Malnutrition Screening Tool    ASSESSMENT:   60 y.o. male with medical history significant of non-small cell lung cancer metastasized, on radiation and immunotherapy,  prostate cancer, degenerative disc disease, GERD, HTN and HDL who is admitted for SIADH  Met with pt in room today. Pt reports fairly good appetite and oral intake at baseline. Pt reports that he ate pretty well at breakfast this morning; pt reports eating 100% of his breakfast. Pt reports that he can not tolerate milky supplements as they make him sick. Pt does utilize protein other supplements at home. Pt reports that he is willing to try Boost Breeze in hospital (mixed berry). Pt also reports that he loves ice cream; RD will add Magic Cups to meal trays. Pt reports that he gets hungry quickly; RD will add snacks between meals. Per chart, pt appears weight stable for the past year.   Medications reviewed and include: aspirin, lovenox, pepcid, folic acid, MVI, thiamine, hypertonic saline   Labs reviewed: Na 121(L), BUN 5(L)  NUTRITION - FOCUSED PHYSICAL EXAM:  Flowsheet Row Most Recent Value  Orbital Region Mild depletion  Upper Arm Region Severe depletion  Thoracic and Lumbar Region Severe depletion  Buccal Region Mild depletion  Temple Region Mild depletion  Clavicle Bone  Region Moderate depletion  Clavicle and Acromion Bone Region Moderate depletion  Scapular Bone Region Moderate depletion  Dorsal Hand Moderate depletion  Patellar Region Severe depletion  Anterior Thigh Region Severe depletion  Posterior Calf Region Severe depletion  Edema (RD Assessment) None  Hair Reviewed  Eyes Reviewed  Mouth Reviewed  Skin Reviewed  Nails Reviewed   Diet Order:   Diet Order             Diet regular Room service appropriate? Yes; Fluid consistency: Thin  Diet effective now                  EDUCATION NEEDS:   Education needs have been addressed  Skin:  Skin Assessment: Reviewed RN Assessment  Last BM:  7/9  Height:   Ht Readings from Last 1 Encounters:  06/03/21 6' (1.829 m)    Weight:   Wt Readings from Last 1 Encounters:  06/03/21 70 kg    Ideal Body Weight:  80.9 kg  BMI:  Body mass index is 20.93 kg/m.  Estimated Nutritional Needs:   Kcal:  2000-2300kcal/day  Protein:  100-115g/day  Fluid:  2.1-2.4L/day  Koleen Distance MS, RD, LDN Please refer to [facility] for RD and/or RD on-call/weekend/after hours pager

## 2021-06-04 NOTE — Progress Notes (Signed)
[place] for Electrolyte Monitoring and Replacement   Recent Labs: Potassium (mmol/L)  Date Value  06/04/2021 3.9   Magnesium (mg/dL)  Date Value  06/03/2021 1.5 (L)   Calcium (mg/dL)  Date Value  06/04/2021 8.9   Albumin (g/dL)  Date Value  06/03/2021 3.7   Phosphorus (mg/dL)  Date Value  04/12/2020 4.2   Sodium (mmol/L)  Date Value  06/04/2021 124 (L)     Assessment: 60 year old male with lung cancer send to the ED for low sodium on labs drawn at the cancer center. Patient has h/o hyponatremia with recent admission for the same. Patient is to start on hypertonic saline per nephrology.  Holding home demeclocycline  Nephrology following. Plan is to continue hypertonic saline until Na > 125. Consider tolvaptan.  Goal of Therapy:  Increase in Na ~ 8 mEq in 24 hrs  Plan:  Na 115 >> 124 over ~26h (increase of 0.35 mmol/L/hr) Continue hypertonic saline at 25 ml/hr Continue monitoring Na q4hr Continue to follow closely for any necessary adjustments Follow-up therapy once Na reaches goal of [place]  06/04/2021 1:26 PM

## 2021-06-04 NOTE — TOC Initial Note (Signed)
Transition of Care [facility]) - Initial/Assessment Note    Patient Details  Name: Richard Santos MRN: [id number] Date of Birth: [birth year]  Transition [facility] Care [facility]) CM/SW Contact:    Richard Santos Phone Number: [phone] 06/04/2021, 9:35 AM  Clinical Narrative:                  Patient presents top [name] due to hyponatremia.  Patient has hx of non-small cell lung cancer which has metastasized to the adrenal gland and is currently on immunotherapy and radiation. Patient is currently independent with all ADLs.  Patient lives at home with spouse Richard, Santos (Spouse) [phone] [facility]).  Patient has hx of cigarette smoking and reports current daily alcohol use.   Expected Discharge Plan: [place] Barriers to Discharge: Continued Medical Work up   Patient Goals and CMS Choice   CMS Medicare.gov Compare Post Acute Care list provided to:: Other (Comment Required) Richard, Santos (Spouse)   [phone] (Mobile)) Choice offered to / list presented to : Spouse  Expected Discharge Plan and Services Expected Discharge Plan: [place] In-house Referral: Clinical Social Work     Living arrangements for the past 2 months: Single Family Home                                      Prior Living Arrangements/Services Living arrangements for the past 2 months: Single Family Home Lives with:: Spouse Patient language and need for interpreter reviewed:: Yes        Need for Family Participation in Patient Care: Yes (Comment) Care giver support system in place?: Yes (comment)   Criminal Activity/Legal Involvement Pertinent to Current Situation/Hospitalization: No - Comment as needed  Activities of Daily Living Home Assistive Devices/Equipment: Eyeglasses ADL Screening (condition at time of admission) Patient's cognitive ability adequate to safely complete daily activities?: Yes Is the patient deaf or have difficulty hearing?: No Does the  patient have difficulty seeing, even when wearing glasses/contacts?: Yes Does the patient have difficulty concentrating, remembering, or making decisions?: No Patient able to express need for assistance with ADLs?: Yes Does the patient have difficulty dressing or bathing?: No Independently performs ADLs?: Yes (appropriate for developmental age) Does the patient have difficulty walking or climbing stairs?: No Weakness of Legs: None Weakness of Arms/Hands: None  Permission Sought/Granted Permission sought to share information with : Family Supports Permission granted to share information with : Yes, Verbal Permission Granted  Share Information with NAME: Richard, Santos (Spouse)   [phone] (Mobile)           Emotional Assessment Appearance:: Appears stated age   Affect (typically observed): Quiet Orientation: : Oriented to Self, Oriented to Place, Oriented to  Time, Oriented to Situation Alcohol / Substance Use: Not Applicable Psych Involvement: No (comment)  Admission diagnosis:  Hypomagnesemia [E83.42] Hyponatremia [E87.1] Metastatic malignant neoplasm, unspecified site [facility]) [C79.9] Patient Active Problem List   Diagnosis Date Noted   SIADH (syndrome of inappropriate ADH production) ([place]) 05/23/2021   Acute hyponatremia 05/21/2021   Hyponatremia 05/20/2021   Metastatic cancer to brain ([place]) 09/05/2020   Port-A-Cath in place 06/03/2020   Emphysema of lung ([place]) 04/26/2020   Small cell lung cancer, left upper lobe ([place]) 04/18/2020   Encounter for antineoplastic chemotherapy 04/18/2020   Encounter for antineoplastic immunotherapy 04/18/2020   Goals of care, counseling/discussion 04/18/2020   Syncope 04/11/2020   Lung mass 04/11/2020  History of prostate cancer 04/11/2020   Chest pain 04/03/2020   Vitamin D deficiency 12/15/2019   Dysphagia 07/17/2017   HTN (hypertension) 08/20/2016   Malignant neoplasm of prostate ([place]) 11/29/2014   Bee sting allergy 07/31/2014    Elevated PSA 04/14/2013   Lumbar disc disease 04/14/2013   Personal history of colonic adenoma 04/26/2012   Impaired glucose tolerance 02/27/2012   Encounter for well adult exam with abnormal findings 02/27/2012   Chronic low back pain    Alcohol abuse 12/31/2010   Functional Dyspepsia 03/11/2009   Hyperlipidemia 01/16/2008   Anxiety state 01/16/2008   ERECTILE DYSFUNCTION 01/16/2008   Tobacco abuse 01/16/2008   ALLERGIC RHINITIS 01/16/2008   GERD 01/16/2008   PCP:  Richard Borg, MD Pharmacy:   [id number] - [place], [address] [employer] [place] [place] [address] Phone: [phone] Fax: [phone]     Social Determinants of Health (SDOH) Interventions    Readmission Risk Interventions No flowsheet data found.

## 2021-06-04 NOTE — Consult Note (Signed)
[facility] NOTE  Patient Care Team: [name], MD as PCP - General Valrie Hart, RN as Oncology Nurse Navigator  CHIEF COMPLAINTS/PURPOSE OF CONSULTATION: Small cell lung cancer/ severe hyponatremia  HISTORY OF PRESENTING ILLNESS:  Richard Santos 60 y.o.  male extensive stage small cell lung cancer-is currently admitted to hospital for mental status changes/severe hyponatremia.  Patient is under treatment for his extensive stage small cell lung cancer-with Dr. Julien Nordmann in [place].  Patient is most recently getting systemic immunotherapy with durvalumab.  Of note patient is currently undergoing radiation to his enlarging left adrenal nodule with expected t radiation treatment to finish on July 14.  However, this of course has been interrupted by hospitalizations.  Of note patient was recently seen in the oncology clinic where his sodium was 118.  At the patient's insistence/reluctance to go back to the hospital-patient was recommended free water restriction and also salt tablets.  And also recommended starting demeclocycline which the patient never started.  Patient complains of poor p.o. intake.  Also he was not able to keep up with fluid restriction.  There are some concern of patient's slurring of speech although patient denies at this time.  Review of Systems  Constitutional:  Positive for malaise/fatigue and weight loss. Negative for chills, diaphoresis and fever.  HENT:  Negative for nosebleeds and sore throat.   Eyes:  Negative for double vision.  Respiratory:  Negative for cough, hemoptysis, sputum production, shortness of breath and wheezing.   Cardiovascular:  Negative for chest pain, palpitations, orthopnea and leg swelling.  Gastrointestinal:  Negative for abdominal pain, blood in stool, constipation, diarrhea, heartburn, melena, nausea and vomiting.  Genitourinary:  Negative for dysuria, frequency and urgency.  Musculoskeletal:  Positive for joint  pain.  Skin: Negative.  Negative for itching and rash.  Neurological:  Positive for dizziness and weakness. Negative for tingling, focal weakness and headaches.  Endo/Heme/Allergies:  Does not bruise/bleed easily.  Psychiatric/Behavioral:  Negative for depression. The patient is not nervous/anxious and does not have insomnia.     MEDICAL HISTORY:  Past Medical History:  Diagnosis Date   Allergic rhinitis    Allergy    Anxiety    Chronic low back pain    DDD (degenerative disc disease), lumbar    ED (erectile dysfunction)    Finger injury    cut pads off 3rd and 4th finger left hand/due to lawn mower accident   GERD (gastroesophageal reflux disease)    Heart murmur    as child only-    History of kidney stones    HTN (hypertension) 08/20/2016   Hyperlipidemia    Incomplete right bundle branch block    Prostate cancer ([place]) dx 10/02/14   stage T1c   Sigmoid diverticulosis    Small cell lung cancer ([place]) 03/2020   Wears glasses     SURGICAL HISTORY: Past Surgical History:  Procedure Laterality Date   BRONCHIAL BIOPSY  04/12/2020   Procedure: BRONCHIAL BIOPSIES;  Surgeon: Marshell Garfinkel, MD;  Location: [facility];  Service: Cardiopulmonary;;   BRONCHIAL BRUSHINGS  04/12/2020   Procedure: BRONCHIAL BRUSHINGS;  Surgeon: Marshell Garfinkel, MD;  Location: [facility];  Service: Cardiopulmonary;;   BRONCHIAL NEEDLE ASPIRATION BIOPSY  04/12/2020   Procedure: BRONCHIAL NEEDLE ASPIRATION BIOPSIES;  Surgeon: Marshell Garfinkel, MD;  Location: [facility];  Service: Cardiopulmonary;;   BRONCHIAL WASHINGS  04/12/2020   Procedure: BRONCHIAL WASHINGS;  Surgeon: Marshell Garfinkel, MD;  Location: [facility];  Service: Cardiopulmonary;;  COLONOSCOPY     COLONOSCOPY W/ POLYPECTOMY  04-19-2012   ENDOBRONCHIAL ULTRASOUND N/A 04/12/2020   Procedure: ENDOBRONCHIAL ULTRASOUND;  Surgeon: Marshell Garfinkel, MD;  Location: [facility];  Service: Cardiopulmonary;  Laterality: N/A;    ESOPHAGOGASTRODUODENOSCOPY  08-05-2010   HEMOSTASIS CONTROL  04/12/2020   Procedure: HEMOSTASIS CONTROL;  Surgeon: Marshell Garfinkel, MD;  Location: [facility];  Service: Cardiopulmonary;;   IR IMAGING GUIDED PORT INSERTION  05/31/2020   POLYPECTOMY     PROSTATE BIOPSY  10/02/14   RADIOACTIVE SEED IMPLANT N/A 01/30/2015   Procedure: RADIOACTIVE SEED IMPLANT;  Surgeon: Rana Snare, MD;  Location: [facility];  Service: Urology;  Laterality: N/A;   UPPER GASTROINTESTINAL ENDOSCOPY  04/03/2021   VIDEO BRONCHOSCOPY N/A 04/12/2020   Procedure: VIDEO BRONCHOSCOPY WITHOUT FLUORO;  Surgeon: Marshell Garfinkel, MD;  Location: [facility];  Service: Cardiopulmonary;  Laterality: N/A;    SOCIAL HISTORY: Social History   Socioeconomic History   Marital status: Married    Spouse name: Not on file   Number of children: 0   Years of education: Not on file   Highest education level: Not on file  Occupational History   Occupation: Public relations account executive    Employer: [employer]  Tobacco Use   Smoking status: Every Day    Packs/day: 0.50    Years: 25.00    Pack years: 12.50    Types: Cigarettes   Smokeless tobacco: Never   Tobacco comments:    smokes 1/2 pack per day 02/11/2021  Vaping Use   Vaping Use: Former  Substance and Sexual Activity   Alcohol use: Yes    Alcohol/week: 49.0 standard drinks    Types: 25 Cans of beer, 24 Standard drinks or equivalent per week    Comment: 5 beer per day   Drug use: No   Sexual activity: Not on file  Other Topics Concern   Not on file  Social History Narrative   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 He is married with no biological children.   Social Determinants of Health   Financial Resource Strain: Not on file  Food Insecurity: Not on file  Transportation Needs: Not on file  Physical Activity: Not on file  Stress: Not on file  Social Connections: Not on file  Intimate Partner Violence: Not on file     FAMILY HISTORY: Family History  Problem Relation Age of Onset   Prostate cancer Father    Heart disease Father    Heart disease Mother    Breast cancer Sister    Heart disease Other    Colon cancer Neg Hx    Colon polyps Neg Hx    Esophageal cancer Neg Hx    Rectal cancer Neg Hx    Stomach cancer Neg Hx     ALLERGIES:  is allergic to bee venom, elemental sulfur, other, sulfa antibiotics, and namenda [memantine].  MEDICATIONS:  Current Facility-Administered Medications  Medication Dose Route Frequency Provider Last Rate Last Admin   acetaminophen (TYLENOL) tablet 650 mg  650 mg Oral Q6H PRN Agbata, Tochukwu, MD   650 mg at 06/04/21 5361   aspirin EC tablet 81 mg  81 mg Oral Daily Agbata, Tochukwu, MD   81 mg at 06/04/21 0934   Chlorhexidine Gluconate Cloth 2 % PADS 6 each  6 each Topical Daily Agbata, Tochukwu, MD   6 each at 06/03/21 1834   enoxaparin (LOVENOX) injection 40 mg  40 mg Subcutaneous Q24H Collier Bullock, MD  40 mg at 06/04/21 2141   famotidine (PEPCID) tablet 20 mg  20 mg Oral BID Agbata, Tochukwu, MD   20 mg at 06/04/21 2141   feeding supplement (BOOST / RESOURCE BREEZE) liquid 1 Container  1 Container Oral TID BM Sreenath, Sudheer B, MD       folic acid (FOLVITE) tablet 1 mg  1 mg Oral Daily Agbata, Tochukwu, MD   1 mg at 06/04/21 0934   LORazepam (ATIVAN) tablet 1-4 mg  1-4 mg Oral Q1H PRN Agbata, Tochukwu, MD   1 mg at 06/04/21 2141   Or   LORazepam (ATIVAN) injection 1-4 mg  1-4 mg Intravenous Q1H PRN Agbata, Tochukwu, MD       mometasone-formoterol (DULERA) 200-5 MCG/ACT inhaler 2 puff  2 puff Inhalation BID Agbata, Tochukwu, MD   2 puff at 06/04/21 2025   And   umeclidinium bromide (INCRUSE ELLIPTA) 62.5 MCG/INH 1 puff  1 puff Inhalation Daily Agbata, Tochukwu, MD   1 puff at 06/04/21 0841   multivitamin with minerals tablet 1 tablet  1 tablet Oral Daily Agbata, Tochukwu, MD   1 tablet at 06/04/21 1253   ondansetron (ZOFRAN) tablet 4 mg  4 mg Oral Q6H  PRN Agbata, Tochukwu, MD       Or   ondansetron (ZOFRAN) injection 4 mg  4 mg Intravenous Q6H PRN Agbata, Tochukwu, MD   4 mg at 06/04/21 0635   [START ON 06/05/2021] sodium chloride tablet 1 g  1 g Oral BID WC Kolluru, Sarath, MD       thiamine tablet 100 mg  100 mg Oral Daily Agbata, Tochukwu, MD   100 mg at 06/04/21 7989   Or   thiamine (B-1) injection 100 mg  100 mg Intravenous Daily Agbata, Tochukwu, MD       traMADol (ULTRAM) tablet 50 mg  50 mg Oral Q12H PRN Agbata, Tochukwu, MD   50 mg at 06/04/21 2141      .  PHYSICAL EXAMINATION:  Vitals:   06/04/21 2000 06/04/21 2021  BP: (!) 127/105   Pulse: 98   Resp:    Temp:  99.1 F (37.3 C)  SpO2: 99%    Filed Weights   06/03/21 1348  Weight: 154 lb 4.8 oz (70 kg)    Physical Exam Vitals and nursing note reviewed.  Constitutional:      Comments: Patient resting in the bed.  Alone. In no acute distress.   HENT:     Head: Normocephalic and atraumatic.     Mouth/Throat:     Mouth: Mucous membranes are moist.     Pharynx: No oropharyngeal exudate.  Eyes:     Pupils: Pupils are equal, round, and reactive to light.  Cardiovascular:     Rate and Rhythm: Normal rate and regular rhythm.  Pulmonary:     Effort: No respiratory distress.     Breath sounds: No wheezing.     Comments: Decreased breath sounds bilaterally at bases.  No wheeze or crackles Abdominal:     General: Bowel sounds are normal. There is no distension.     Palpations: Abdomen is soft. There is no mass.     Tenderness: There is no abdominal tenderness. There is no guarding or rebound.  Musculoskeletal:        General: No tenderness. Normal range of motion.     Cervical back: Normal range of motion and neck supple.  Skin:    General: Skin is warm.  Neurological:  Mental Status: He is alert and oriented to person, place, and time.  Psychiatric:        Mood and Affect: Affect normal.        Judgment: Judgment normal.     LABORATORY DATA:  I have  reviewed the data as listed Lab Results  Component Value Date   WBC 5.0 06/04/2021   HGB 12.5 (L) 06/04/2021   HCT 34.1 (L) 06/04/2021   MCV 96.1 06/04/2021   PLT 210 06/04/2021   Recent Labs    07/15/20 1100 07/22/20 1030 08/08/20 1203 09/05/20 1103 05/20/21 1139 05/20/21 1518 05/29/21 0741 06/03/21 1002 06/03/21 1422 06/04/21 0630 06/04/21 1232 06/04/21 1700 06/04/21 2119  NA 139 136 141   < > 118*   < > 118* 115*   < > 121* 124* 126* 128*  K 4.3 4.3 4.1   < > 4.5   < > 3.9 4.4  --  3.9  --   --   --   CL 106 100 106   < > 84*   < > 84* 81*  --  87*  --   --   --   CO2 26 29 27    < > 24   < > 26 25  --  26  --   --   --   GLUCOSE 101* 122* 90   < > 102*   < > 102* 103*  --  101*  --   --   --   BUN 14 18 11    < > 9   < > 5* 6  --  5*  --   --   --   CREATININE 0.85 0.84 0.80   < > 0.70   < > 0.70 0.75  --  0.62  --   --   --   CALCIUM 9.5 10.4* 9.2   < > 9.8   < > 9.4 9.7  --  8.9  --   --   --   GFRNONAA >60 >60 >60   < > >60   < > >60 >60  --  >60  --   --   --   GFRAA >60 >60 >60  --   --   --   --   --   --   --   --   --   --   PROT 7.1 7.1 7.3   < > 7.8  --  7.3 7.3  --   --   --   --   --   ALBUMIN 3.8 3.9 3.9   < > 4.0  --  3.6 3.7  --   --   --   --   --   AST 28 21 35   < > 56*  --  27 36  --   --   --   --   --   ALT 21 19 28    < > 40  --  23 26  --   --   --   --   --   ALKPHOS 99 125 95   < > 88  --  86 95  --   --   --   --   --   BILITOT 0.2* 0.7 0.3   < > 0.6  --  0.4 0.5  --   --   --   --   --    < > = values in this interval not displayed.  RADIOGRAPHIC STUDIES: I have personally reviewed the radiological images as listed and agreed with the findings in the report. CT CHEST W CONTRAST  Result Date: 05/22/2021 CLINICAL DATA:  60 year old male with small cell lung cancer restaging. EXAM: CT CHEST, ABDOMEN, AND PELVIS WITH CONTRAST TECHNIQUE: Multidetector CT imaging of the chest, abdomen and pelvis was performed following the standard protocol  during bolus administration of intravenous contrast. CONTRAST:  164mL OMNIPAQUE IOHEXOL 300 MG/ML  SOLN COMPARISON:  CT dated 04/18/2021. FINDINGS: CT CHEST FINDINGS Cardiovascular: There is no cardiomegaly or pericardial effusion. There is coronary vascular calcification. Right-sided Port-A-Cath with tip at the cavoatrial junction. The thoracic aorta is unremarkable. The origins of the great vessels of the aortic arch appear patent. The central pulmonary arteries are unremarkable. Mediastinum/Nodes: No hilar or mediastinal adenopathy. The esophagus and the thyroid gland are grossly unremarkable. No mediastinal fluid collection. Lungs/Pleura: Post radiation changes of the left lung and left perihilar region with traction bronchiectasis and architectural distortion. Slight interval decrease in the size of the cavitary lesion in the lingula measuring up to 3.3 cm. There is a 7 x 8 mm nodular density in the medial left upper lobe (45/4) which has increased in size since the prior CT (previously measuring approximately 4 mm). A lobulated and somewhat cavitary lesion in the left upper lobe measures 12 x 18 mm similar to prior CT. Similar appearance of subpleural thickening and scarring along the posterior aspect of the superior segment of the left lower lobe. Biapical subpleural scarring. There is a 3 mm subpleural nodule in the anterior right upper lobe (50/4), increased in size since the prior CT. There is no pleural effusion pneumothorax. The central airways are patent. Musculoskeletal: No axillary adenopathy. Similar appearance of a 4 mm nodule in the subcutaneous soft tissues of the right lateral chest wall (34/2). A 1.2 x 1.3 cm nodular density in the subcutaneous soft tissues of the left anterior chest wall (34/2) has increased in size (previously measuring approximately 6 x 8 mm). There is degenerative changes of the spine. Stable 5 mm sclerotic lesion in the T8, indeterminate. No acute osseous pathology. CT  ABDOMEN PELVIS FINDINGS No intra-abdominal free air or free fluid. Hepatobiliary: No focal liver abnormality is seen. No gallstones, gallbladder wall thickening, or biliary dilatation. Pancreas: Unremarkable. No pancreatic ductal dilatation or surrounding inflammatory changes. Spleen: Normal in size without focal abnormality. Adrenals/Urinary Tract: The right adrenal gland is unremarkable. Lobulated mass involving the left adrenal gland measures 4.2 x 2.8 cm (61/2), not significantly changed. There is a 2 cm right renal interpolar cyst. There is no hydronephrosis on either side. There is symmetric enhancement and excretion of contrast by both kidneys. The visualized ureters appear unremarkable. There is irregular and thickened appearance of the posterior bladder wall which is not evaluated on this CT but may be related to enlargement of the prostate gland and changes of TURP. Cystoscopy may provide better evaluation. Stomach/Bowel: There is sigmoid diverticulosis without active inflammatory changes. There is no bowel obstruction or active inflammation. The appendix is normal. Vascular/Lymphatic: Advanced aortoiliac atherosclerotic disease. The IVC is unremarkable. No portal venous gas. There is no adenopathy. Reproductive: Enlarged prostate gland with brachytherapy seeds . Other: None Musculoskeletal: Osteopenia with degenerative changes of the spine. No acute osseous pathology. IMPRESSION: 1. Post radiation changes of the left lung and left perihilar region with traction bronchiectasis and architectural distortion. Slight interval decrease in the size of the cavitary lesion in the lingula. 2. Interval increase in the size of  pulmonary nodule in the medial left upper lobe. Additionally a subcutaneous nodule in the left anterior chest wall has increased since the prior CT. 3. No significant interval change in the size of the left adrenal mass. 4. Sigmoid diverticulosis. No bowel obstruction. Normal appendix. 5.  Aortic Atherosclerosis (ICD10-I70.0). Electronically Signed   By: Anner Crete M.D.   On: 05/22/2021 19:20   MR Brain W Wo Contrast  Result Date: 05/16/2021 CLINICAL DATA:  Metastatic lung cancer post radiation. EXAM: MRI HEAD WITHOUT AND WITH CONTRAST TECHNIQUE: Multiplanar, multiecho pulse sequences of the brain and surrounding structures were obtained without and with intravenous contrast. CONTRAST:  78mL GADAVIST GADOBUTROL 1 MMOL/ML IV SOLN COMPARISON:  MRI head 02/13/2021 FINDINGS: Brain: New lesions: 9 mm necrotic lesion in the left temporoparietal lobe with surrounding edema. Axial image 134 4 mm necrotic lesion right temporal lobe with minimal edema. Axial image 172 5 mm enhancing lesion right lateral basal ganglia/external capsule. Axial image 190 3.5 mm lesion left frontal lobe axial image   195 3 mm lesion left head of caudate axial image 200. This image shows restricted diffusion. Stable lesions: Treated lesion left cerebellum without recurrence. Small amount of associated susceptibility. Ventricle size normal. Vascular: Normal arterial flow voids. Small developmental venous anomaly right medial parietal lobe unchanged. Skull and upper cervical spine: No focal skeletal metastasis identified. Sinuses/Orbits: Mild mucosal edema paranasal sinuses. Bilateral mastoid effusion similar to the prior MRI. 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   On: 05/16/2021 12:47   CT ABDOMEN PELVIS W CONTRAST  Result Date: 05/22/2021 CLINICAL DATA:  60 year old male with small cell lung cancer restaging. EXAM: CT CHEST, ABDOMEN, AND PELVIS WITH CONTRAST TECHNIQUE: Multidetector CT imaging of the chest, abdomen and pelvis was performed following the standard protocol during bolus administration of intravenous contrast. CONTRAST:  133mL OMNIPAQUE  IOHEXOL 300 MG/ML  SOLN COMPARISON:  CT dated 04/18/2021. FINDINGS: CT CHEST FINDINGS Cardiovascular: There is no cardiomegaly or pericardial effusion. There is coronary vascular calcification. Right-sided Port-A-Cath with tip at the cavoatrial junction. The thoracic aorta is unremarkable. The origins of the great vessels of the aortic arch appear patent. The central pulmonary arteries are unremarkable. Mediastinum/Nodes: No hilar or mediastinal adenopathy. The esophagus and the thyroid gland are grossly unremarkable. No mediastinal fluid collection. Lungs/Pleura: Post radiation changes of the left lung and left perihilar region with traction bronchiectasis and architectural distortion. Slight interval decrease in the size of the cavitary lesion in the lingula measuring up to 3.3 cm. There is a 7 x 8 mm nodular density in the medial left upper lobe (45/4) which has increased in size since the prior CT (previously measuring approximately 4 mm). A lobulated and somewhat cavitary lesion in the left upper lobe measures 12 x 18 mm similar to prior CT. Similar appearance of subpleural thickening and scarring along the posterior aspect of the superior segment of the left lower lobe. Biapical subpleural scarring. There is a 3 mm subpleural nodule in the anterior right upper lobe (50/4), increased in size since the prior CT. There is no pleural effusion pneumothorax. The central airways are patent. Musculoskeletal: No axillary adenopathy. Similar appearance of a 4 mm nodule in the subcutaneous soft tissues of the right lateral chest wall (34/2). A 1.2 x 1.3 cm nodular density in the subcutaneous soft tissues of the left anterior chest wall (34/2) has  increased in size (previously measuring approximately 6 x 8 mm). There is degenerative changes of the spine. Stable 5 mm sclerotic lesion in the T8, indeterminate. No acute osseous pathology. CT ABDOMEN PELVIS FINDINGS No intra-abdominal free air or free fluid. Hepatobiliary: No  focal liver abnormality is seen. No gallstones, gallbladder wall thickening, or biliary dilatation. Pancreas: Unremarkable. No pancreatic ductal dilatation or surrounding inflammatory changes. Spleen: Normal in size without focal abnormality. Adrenals/Urinary Tract: The right adrenal gland is unremarkable. Lobulated mass involving the left adrenal gland measures 4.2 x 2.8 cm (61/2), not significantly changed. There is a 2 cm right renal interpolar cyst. There is no hydronephrosis on either side. There is symmetric enhancement and excretion of contrast by both kidneys. The visualized ureters appear unremarkable. There is irregular and thickened appearance of the posterior bladder wall which is not evaluated on this CT but may be related to enlargement of the prostate gland and changes of TURP. Cystoscopy may provide better evaluation. Stomach/Bowel: There is sigmoid diverticulosis without active inflammatory changes. There is no bowel obstruction or active inflammation. The appendix is normal. Vascular/Lymphatic: Advanced aortoiliac atherosclerotic disease. The IVC is unremarkable. No portal venous gas. There is no adenopathy. Reproductive: Enlarged prostate gland with brachytherapy seeds . Other: None Musculoskeletal: Osteopenia with degenerative changes of the spine. No acute osseous pathology. IMPRESSION: 1. Post radiation changes of the left lung and left perihilar region with traction bronchiectasis and architectural distortion. Slight interval decrease in the size of the cavitary lesion in the lingula. 2. Interval increase in the size of pulmonary nodule in the medial left upper lobe. Additionally a subcutaneous nodule in the left anterior chest wall has increased since the prior CT. 3. No significant interval change in the size of the left adrenal mass. 4. Sigmoid diverticulosis. No bowel obstruction. Normal appendix. 5. Aortic Atherosclerosis (ICD10-I70.0). Electronically Signed   By: Anner Crete M.D.    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  S/p nephrology evaluation currently on hypertonic saline  #Extensive stage small cell lung cancer-currently on maintenance immunotherapy as per Dr. Julien Nordmann; radiation to the left adrenal enlarging mass-Per Dr. Lisbeth Renshaw.   Recommendations:  #Agree with current management hypertonic saline with slow improvement of the sodium levels.  Discussed with Dr. Juleen China; nephrology regarding use of ADH antagonist as patient will likely recur soon.   #With regards to management of small cell lung cancer-I would defer to patient's primary oncologist, Dr. Glendell Docker radiation oncology.   #Patient again insistent that he wanted to go home soon and keeps questioning why his treatment cannot be done on outpatient basis.  I had a long discussion with the patient regarding the need to monitor the sodium closely-and rapid rise with be quite detrimental to his care.  Patient does not seem to comprehend the gravity of the situation.  After informed patient's primary oncologist of admission in the morning tomorrow.   Thank you Dr.Sreenath for allowing me to participate in the care of your pleasant patient. Please do not hesitate to contact me with questions or concerns in the interim.  Discussed with Dr.Sreenath and Dr.Kolluru.   All questions were answered. The patient knows to call the clinic with any problems, questions or concerns.   Cammie Sickle, MD 06/04/2021 10:14 PM

## 2021-06-04 NOTE — Progress Notes (Signed)
[facility]  ROUNDING NOTE   Subjective:   Placed on hypertonic saline 52mL/hr  Sodium 121.   Objective:  Vital signs in last 24 hours:  Temp:  [98.1 F (36.7 C)-98.6 F (37 C)] 98.6 F (37 C) (07/13 0830) Pulse Rate:  [73-151] 73 (07/13 0830) Resp:  [13-20] 15 (07/13 0830) BP: (96-134)/(66-90) 121/77 (07/13 0800) SpO2:  [95 %-100 %] 100 % (07/13 0830) Weight:  [70 kg] 70 kg (07/12 1348)  Weight change:  Filed Weights   06/03/21 1348  Weight: 70 kg    Intake/Output: I/O last 3 completed shifts: In: 378.3 [I.V.:328.3; IV Piggyback:50] Out: 800 [Urine:800]   Intake/Output this shift:  No intake/output data recorded.  Physical Exam: General: NAD,   Head: Normocephalic, atraumatic. Moist oral mucosal membranes  Eyes: Anicteric, PERRL  Neck: Supple, trachea midline  Lungs:  Clear to auscultation  Heart: Regular rate and rhythm  Abdomen:  Soft, nontender,   Extremities: No peripheral edema.  Neurologic: Nonfocal, moving all four extremities  Skin: No lesions        Basic Metabolic Panel: Recent Labs  Lab 05/29/21 0741 06/03/21 1002 06/03/21 1422 06/03/21 1845 06/03/21 2227 06/04/21 0630  NA 118* 115* 114* 116* 118* 121*  K 3.9 4.4  --   --   --  3.9  CL 84* 81*  --   --   --  87*  CO2 26 25  --   --   --  26  GLUCOSE 102* 103*  --   --   --  101*  BUN 5* 6  --   --   --  5*  CREATININE 0.70 0.75  --   --   --  0.62  CALCIUM 9.4 9.7  --   --   --  8.9  MG  --   --  1.5*  --   --   --     Liver Function Tests: Recent Labs  Lab 05/29/21 0741 06/03/21 1002  AST 27 36  ALT 23 26  ALKPHOS 86 95  BILITOT 0.4 0.5  PROT 7.3 7.3  ALBUMIN 3.6 3.7   No results for input(s): LIPASE, AMYLASE in the last 168 hours. No results for input(s): AMMONIA in the last 168 hours.  CBC: Recent Labs  Lab 05/29/21 0741 06/03/21 1002 06/03/21 1845 06/04/21 0630  WBC 4.7 5.6 4.9 5.0  NEUTROABS 3.3 4.4  --   --   HGB 11.3* 11.9* 11.7* 12.5*  HCT  30.1* 31.7* RESULTS UNAVAILABLE DUE TO INTERFERING SUBSTANCE 34.1*  MCV 94.4 94.3 RESULTS UNAVAILABLE DUE TO INTERFERING SUBSTANCE 96.1  PLT 263 245 199 210    Cardiac Enzymes: No results for input(s): CKTOTAL, CKMB, CKMBINDEX, TROPONINI in the last 168 hours.  BNP: Invalid input(s): POCBNP  CBG: Recent Labs  Lab 06/03/21 1823  GLUCAP 110*    Microbiology: Results for orders placed or performed during the hospital encounter of 06/03/21  Resp Panel by RT-PCR (Flu A&B, Covid) Nasopharyngeal Swab     Status: None   Collection Time: 06/03/21  2:23 PM   Specimen: Nasopharyngeal Swab; Nasopharyngeal(NP) swabs in vial transport medium  Result Value Ref Range Status   SARS Coronavirus 2 by RT PCR NEGATIVE NEGATIVE Final    Comment: (NOTE) SARS-CoV-2 target nucleic acids are NOT DETECTED.  The SARS-CoV-2 RNA is generally detectable in upper respiratory specimens during the acute phase of infection. The lowest concentration of SARS-CoV-2 viral copies this assay can detect is 138 copies/mL. A  negative result does not preclude SARS-Cov-2 infection and should not be used as the sole basis for treatment or other patient management decisions. A negative result may occur with  improper specimen collection/handling, submission of specimen other than nasopharyngeal swab, presence of viral mutation(s) within the areas targeted by this assay, and inadequate number of viral copies(<138 copies/mL). A negative result must be combined with clinical observations, patient history, and epidemiological information. The expected result is Negative.  Fact Sheet for Patients:  EntrepreneurPulse.com.au  Fact Sheet for Healthcare Providers:  IncredibleEmployment.be  This test is no t yet approved or cleared by the [place] FDA and  has been authorized for detection and/or diagnosis of SARS-CoV-2 by FDA under an Emergency Use Authorization (EUA). This EUA will  remain  in effect (meaning this test can be used) for the duration of the COVID-19 declaration under Section 564(b)(1) of the Act, 21 U.S.C.section 360bbb-3(b)(1), unless the authorization is terminated  or revoked sooner.       Influenza A by PCR NEGATIVE NEGATIVE Final   Influenza B by PCR NEGATIVE NEGATIVE Final    Comment: (NOTE) The Xpert Xpress SARS-CoV-2/FLU/RSV plus assay is intended as an aid in the diagnosis of influenza from Nasopharyngeal swab specimens and should not be used as a sole basis for treatment. Nasal washings and aspirates are unacceptable for Xpert Xpress SARS-CoV-2/FLU/RSV testing.  Fact Sheet for Patients: EntrepreneurPulse.com.au  Fact Sheet for Healthcare Providers: IncredibleEmployment.be  This test is not yet approved or cleared by the [place] FDA and has been authorized for detection and/or diagnosis of SARS-CoV-2 by FDA under an Emergency Use Authorization (EUA). This EUA will remain in effect (meaning this test can be used) for the duration of the COVID-19 declaration under Section 564(b)(1) of the Act, 21 U.S.C. section 360bbb-3(b)(1), unless the authorization is terminated or revoked.  Performed at [facility], [address] [place], [place] [address]   MRSA Next Gen by PCR, Nasal     Status: None   Collection Time: 06/03/21  5:41 PM   Specimen: Nasal Mucosa; Nasal Swab  Result Value Ref Range Status   MRSA by PCR Next Gen NOT DETECTED NOT DETECTED Final    Comment: (NOTE) The GeneXpert MRSA Assay (FDA approved for NASAL specimens only), is one component of a comprehensive MRSA colonization surveillance program. It is not intended to diagnose MRSA infection nor to guide or monitor treatment for MRSA infections. Test performance is not FDA approved in patients less than 11 years old. Performed at [facility], [address] [place], [place] [address]     Coagulation  Studies: No results for input(s): LABPROT, INR in the last 72 hours.  Urinalysis: Recent Labs    06/03/21 1423  COLORURINE YELLOW*  LABSPEC 1.011  PHURINE 6.0  GLUCOSEU 50*  HGBUR NEGATIVE  BILIRUBINUR NEGATIVE  KETONESUR NEGATIVE  PROTEINUR NEGATIVE  NITRITE NEGATIVE  LEUKOCYTESUR NEGATIVE      Imaging: No results found.   Medications:    sodium chloride (hypertonic) 25 mL/hr at 06/04/21 0600    aspirin EC  81 mg Oral Daily   Chlorhexidine Gluconate Cloth  6 each Topical Daily   demeclocycline  300 mg Oral BID   enoxaparin (LOVENOX) injection  40 mg Subcutaneous Q24H   famotidine  20 mg Oral BID   folic acid  1 mg Oral Daily   mometasone-formoterol  2 puff Inhalation BID   And   umeclidinium bromide  1 puff Inhalation Daily   multivitamin  with minerals  1 tablet Oral Daily   thiamine  100 mg Oral Daily   Or   thiamine  100 mg Intravenous Daily   acetaminophen, LORazepam **OR** LORazepam, ondansetron **OR** ondansetron (ZOFRAN) IV, traMADol  Assessment/ Plan:  [name] is a 60 y.o. white male with small cell carcinoma with metastasis, hypertension, hyperlipidemia, GERD, prostate cancer, who was admitted to [facility] on 06/03/2021 for Hyponatremia [E87.1]   Hyponatremia SIADH Hypertension: at goal 121/77   - Continue hypertonic saline until serum sodium is above 125.  - Then place on fluid restriction and salt tablets.  - If no improvement, consider tolvaptan. Tolvaptan is an option for outpatient. - Currently holding demeclocycline.    LOS: 1 [place] 7/13/20229:18 AM

## 2021-06-04 NOTE — Progress Notes (Signed)
PROGRESS NOTE    Richard Santos  IYM:[id number] DOB: [birth year] DOA: 06/03/2021 PCP: [name], MD    Brief Narrative:  60 y.o. 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 Patient was recently hospitalized for same and upon discharge his serum sodium was 125.  Admitted to stepdown status.  Nephrology consulted with recommendations for hypertonic saline.  Has been on 3% saline since admission at a rate of 25 cc/h.  Sodium level slowly improving.  Per nephrology recommendations we will continue 3% saline until sodium level greater than 125 at that time transition to fluid restriction and salt tablets with consideration for tolvaptan.   Assessment & Plan:   Principal Problem:   Hyponatremia Active Problems:   Tobacco abuse   Alcohol abuse   HTN (hypertension)   Small cell lung cancer, left upper lobe (HCC)   SIADH (syndrome of inappropriate ADH production) (HCC)   Protein-calorie malnutrition, severe  Hyponatremia Most likely secondary to SIADH from patient's known small cell lung cancer Serum osmolality is 233 and urine sodium level is 39 Patient was placed on demeclocycline by oncology but has not started taking it Appreciate nephrology input Plan: Continue hypertonic saline until sodium greater than 125 Continue every 4 hours sodiums Continue ICU status for now Once sodium greater than 125 transition to salt tablets and fluid restriction Consider tolvaptan if above interventions are ineffective Case discussed with nephrology   Nicotine dependence Patient declines a nicotine patch at this time     History of alcohol dependence Denies having  symptoms of alcohol withdrawal when he does not drink Can continue CIWA checks for 1 day however risk of withdrawal is low     Small cell lung cancer, left upper lobe 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   Difficult to place patient No  Symptomatic hyponatremia in the setting of small cell lung cancer, likely SIADH     Level of care: Stepdown  Consultants:  Nephrology Oncology  Procedures:  None  Antimicrobials:  None   Subjective: Seen and examined.  Reports improvement in symptoms since admission.  No pain complaints.  Objective: Vitals:   06/04/21 0800 06/04/21 0830 06/04/21 0900 06/04/21 1000  BP: 121/77   99/66  Pulse: (!) 151 73 85 80  Resp: 16 15 13 16   Temp:  98.6 F (37 C)    TempSrc:  Oral    SpO2: 100% 100% 96% 99%  Weight:      Height:        Intake/Output Summary (Last 24 hours) at 06/04/2021 1526 Last data filed at 06/04/2021 0830 Gross per 24 hour  Intake 378.27 ml  Output 1000 ml  Net -621.73 ml   [employer]  06/03/21 1348  Weight: 70 kg    Examination:  General exam: No acute distress.  Appears chronically ill Respiratory system: Clear to auscultation. Respiratory effort normal. Cardiovascular system: S1 & S2 heard, RRR. No JVD, murmurs, rubs, gallops or clicks. No pedal edema. Gastrointestinal system: Abdomen is nondistended, soft and nontender. No organomegaly or masses felt. Normal bowel sounds heard. Central nervous system: Alert and oriented. No focal neurological deficits. Extremities: Symmetric 5 x 5  power. Skin: No rashes, lesions or ulcers Psychiatry: Judgement and insight appear normal. Mood & affect appropriate.     Data Reviewed: I have personally reviewed following labs and imaging studies  CBC: Recent Labs  Lab 05/29/21 0741 06/03/21 1002 06/03/21 1845 06/04/21 0630  WBC 4.7 5.6 4.9 5.0  NEUTROABS 3.3 4.4  --   --   HGB 11.3* 11.9* 11.7* 12.5*  HCT 30.1* 31.7* RESULTS UNAVAILABLE DUE TO INTERFERING SUBSTANCE 34.1*  MCV 94.4 94.3 RESULTS UNAVAILABLE DUE TO INTERFERING SUBSTANCE 96.1  PLT 263 245 199 962   Basic Metabolic Panel: Recent Labs  Lab 05/29/21 0741 06/03/21 1002 06/03/21 1422 06/03/21 1845 06/03/21 2227 06/04/21 0630 06/04/21 1232  NA 118* 115* 114* 116* 118* 121* 124*  K 3.9 4.4  --   --   --  3.9  --   CL 84* 81*  --   --   --  87*  --   CO2 26 25  --   --   --  26  --   GLUCOSE 102* 103*  --   --   --  101*  --   BUN 5* 6  --   --   --  5*  --   CREATININE 0.70 0.75  --   --   --  0.62  --   CALCIUM 9.4 9.7  --   --   --  8.9  --   MG  --   --  1.5*  --   --   --   --    GFR: Estimated Creatinine Clearance: 98.4 mL/min (by C-G formula based on SCr of 0.62 mg/dL). Liver Function Tests: Recent Labs  Lab 05/29/21 0741 06/03/21 1002  AST 27 36  ALT 23 26  ALKPHOS 86 95  BILITOT 0.4 0.5  PROT 7.3 7.3  ALBUMIN 3.6 3.7   No results for input(s): LIPASE, AMYLASE in the last 168 hours. No results for input(s): AMMONIA in the last 168 hours. Coagulation Profile: No results for input(s): INR, PROTIME in the last 168 hours. Cardiac Enzymes: No results for input(s): CKTOTAL, CKMB, CKMBINDEX, TROPONINI in the last 168 hours. BNP (last 3 results) No results for input(s): PROBNP in the last 8760 hours. HbA1C: No results for input(s): HGBA1C in the last 72 hours. CBG: Recent Labs  Lab 06/03/21 1823  GLUCAP 110*   Lipid Profile: No results for input(s): CHOL, HDL, LDLCALC, TRIG, CHOLHDL, LDLDIRECT in the last 72 hours. Thyroid Function  Tests: No results for input(s): TSH, T4TOTAL, FREET4, T3FREE, THYROIDAB in the last 72 hours. Anemia Panel: No results for input(s): VITAMINB12, FOLATE, FERRITIN, TIBC, IRON, RETICCTPCT in the last 72 hours. Sepsis Labs: No results for input(s): PROCALCITON, LATICACIDVEN in the last 168 hours.  Recent Results (from the past 240 hour(s))  Resp Panel by RT-PCR (Flu A&B, Covid) Nasopharyngeal Swab     Status: None   Collection Time: 06/03/21  2:23 PM   Specimen: Nasopharyngeal Swab; Nasopharyngeal(NP) swabs in vial transport medium  Result Value Ref Range Status  SARS Coronavirus 2 by RT PCR NEGATIVE NEGATIVE Final    Comment: (NOTE) SARS-CoV-2 target nucleic acids are NOT DETECTED.  The SARS-CoV-2 RNA is generally detectable in upper respiratory specimens during the acute phase of infection. The lowest concentration of SARS-CoV-2 viral copies this assay can detect is 138 copies/mL. A negative result does not preclude SARS-Cov-2 infection and should not be used as the sole basis for treatment or other patient management decisions. A negative result may occur with  improper specimen collection/handling, submission of specimen other than nasopharyngeal swab, presence of viral mutation(s) within the areas targeted by this assay, and inadequate number of viral copies(<138 copies/mL). A negative result must be combined with clinical observations, patient history, and epidemiological information. The expected result is Negative.  Fact Sheet for Patients:  EntrepreneurPulse.com.au  Fact Sheet for Healthcare Providers:  IncredibleEmployment.be  This test is no t yet approved or cleared by the [place] FDA and  has been authorized for detection and/or diagnosis of SARS-CoV-2 by FDA under an Emergency Use Authorization (EUA). This EUA will remain  in effect (meaning this test can be used) for the duration of the COVID-19 declaration under Section  564(b)(1) of the Act, 21 U.S.C.section 360bbb-3(b)(1), unless the authorization is terminated  or revoked sooner.       Influenza A by PCR NEGATIVE NEGATIVE Final   Influenza B by PCR NEGATIVE NEGATIVE Final    Comment: (NOTE) The Xpert Xpress SARS-CoV-2/FLU/RSV plus assay is intended as an aid in the diagnosis of influenza from Nasopharyngeal swab specimens and should not be used as a sole basis for treatment. Nasal washings and aspirates are unacceptable for Xpert Xpress SARS-CoV-2/FLU/RSV testing.  Fact Sheet for Patients: EntrepreneurPulse.com.au  Fact Sheet for Healthcare Providers: IncredibleEmployment.be  This test is not yet approved or cleared by the [place] FDA and has been authorized for detection and/or diagnosis of SARS-CoV-2 by FDA under an Emergency Use Authorization (EUA). This EUA will remain in effect (meaning this test can be used) for the duration of the COVID-19 declaration under Section 564(b)(1) of the Act, 21 U.S.C. section 360bbb-3(b)(1), unless the authorization is terminated or revoked.  Performed at [facility], [address] [place], [place] [address]   MRSA Next Gen by PCR, Nasal     Status: None   Collection Time: 06/03/21  5:41 PM   Specimen: Nasal Mucosa; Nasal Swab  Result Value Ref Range Status   MRSA by PCR Next Gen NOT DETECTED NOT DETECTED Final    Comment: (NOTE) The GeneXpert MRSA Assay (FDA approved for NASAL specimens only), is one component of a comprehensive MRSA colonization surveillance program. It is not intended to diagnose MRSA infection nor to guide or monitor treatment for MRSA infections. Test performance is not FDA approved in patients less than 37 years old. Performed at [facility], [address], [place], [place] [address]          Radiology Studies: No results found.      Scheduled Meds:  aspirin EC  81 mg Oral Daily   Chlorhexidine  Gluconate Cloth  6 each Topical Daily   enoxaparin (LOVENOX) injection  40 mg Subcutaneous Q24H   famotidine  20 mg Oral BID   feeding supplement  1 Container Oral TID BM   folic acid  1 mg Oral Daily   mometasone-formoterol  2 puff Inhalation BID   And   umeclidinium bromide  1 puff Inhalation Daily   multivitamin with minerals  1 tablet  Oral Daily   thiamine  100 mg Oral Daily   Or   thiamine  100 mg Intravenous Daily   Continuous Infusions:  sodium chloride (hypertonic) 25 mL/hr at 06/04/21 1414     LOS: 1 day    Time spent: 25 minutes    Sidney Ace, MD [facility] Pager [phone]  If 7PM-7AM, please contact night-coverage 06/04/2021, 3:26 PM

## 2021-06-04 NOTE — Progress Notes (Signed)
[facility] for Electrolyte Monitoring and Replacement   Recent Labs: Potassium (mmol/L)  Date Value  06/04/2021 3.9   Magnesium (mg/dL)  Date Value  06/03/2021 1.5 (L)   Calcium (mg/dL)  Date Value  06/04/2021 8.9   Albumin (g/dL)  Date Value  06/03/2021 3.7   Phosphorus (mg/dL)  Date Value  04/12/2020 4.2   Sodium (mmol/L)  Date Value  06/04/2021 126 (L)     Assessment: 60 year old male with lung cancer send to the ED for low sodium on labs drawn at the cancer center. Patient has h/o hyponatremia with recent admission for the same. Patient is to start on hypertonic saline per nephrology.  Holding home demeclocycline  Nephrology following. Plan is to continue hypertonic saline until Na > 125. Consider tolvaptan.  Goal of Therapy:  Increase in Na ~ 8 mEq in 24 hrs  Plan:  Na 126, notified MD and 3% NaCl stopped.  Vira Blanco  06/04/2021 5:36 PM

## 2021-06-04 NOTE — Assessment & Plan Note (Addendum)
#  60 year old male patient with history of stage IV extensive stage small cell lung cancer is currently admitted hospital for worsening hyponatremia  #Severe hyponatremia/likely secondary to SIADH underlying malignancy-nadir sodium 115-118 symptomatic.  S/p nephrology; s/p hypertonic saline-sodium improved to 124.  Discussed with nephrology regarding tolvaptan; received 1 dose prior to discharge.  Again discussed with patient regarding free water restriction; salt tablets.  Close follow-up with Dr. Myles Gip regarding frequent lab checks.  #Extensive stage small cell lung cancer-currently on maintenance immunotherapy as per Dr. Julien Nordmann; radiation to the left adrenal enlarging mass-Per Dr. Lisbeth Renshaw.   #Patient is clinically stable from oncology standpoint to be discharged.  Discussed with Dr.Sreenath and Dr.Kolluru.  Also informed Dr. Julien Nordmann of the plan.

## 2021-06-05 ENCOUNTER — Ambulatory Visit: Payer: 59

## 2021-06-05 DIAGNOSIS — E871 Hypo-osmolality and hyponatremia: Secondary | ICD-10-CM | POA: Diagnosis not present

## 2021-06-05 LAB — SODIUM
Sodium: 126 mmol/L — ABNORMAL LOW (ref 135–145)
Sodium: 125 mmol/L — ABNORMAL LOW (ref 135–145)

## 2021-06-05 LAB — BASIC METABOLIC PANEL
Anion gap: 8 (ref 5–15)
BUN: 8 mg/dL (ref 6–20)
CO2: 26 mmol/L (ref 22–32)
Calcium: 9.2 mg/dL (ref 8.9–10.3)
Chloride: 93 mmol/L — ABNORMAL LOW (ref 98–111)
Creatinine, Ser: 0.65 mg/dL (ref 0.61–1.24)
GFR, Estimated: >60 mL/min (ref >60)
Glucose, Bld: 115 mg/dL — ABNORMAL HIGH (ref 70–99)
Potassium: 3.7 mmol/L (ref 3.5–5.1)
Sodium: 127 mmol/L — ABNORMAL LOW (ref 135–145)

## 2021-06-05 LAB — HEPATIC FUNCTION PANEL
ALT: 26 U/L (ref 0–44)
AST: 36 U/L (ref 15–41)
Albumin: 3.8 g/dL (ref 3.5–5.0)
Alkaline Phosphatase: 79 U/L (ref 38–126)
Bilirubin, Direct: <0.1 mg/dL (ref 0.0–0.2)
Total Bilirubin: 0.4 mg/dL (ref 0.3–1.2)
Total Protein: 7.2 g/dL (ref 6.5–8.1)

## 2021-06-05 MED ORDER — TOLVAPTAN 15 MG PO TABS
15.0000 mg | ORAL_TABLET | Freq: Once | ORAL | Status: AC
  Administered 2021-06-05: 15 mg via ORAL
  Filled 2021-06-05: qty 1

## 2021-06-05 MED ORDER — SODIUM CHLORIDE 1 G PO TABS: 1.0000 g | ORAL_TABLET | Freq: Two times a day (BID) | ORAL | 0 refills | Status: AC

## 2021-06-05 MED ORDER — HEPARIN SOD (PORK) LOCK FLUSH 100 UNIT/ML IV SOLN
500.0000 [IU] | Freq: Once | INTRAVENOUS | Status: AC
  Administered 2021-06-05: 500 [IU] via INTRAVENOUS
  Filled 2021-06-05: qty 5

## 2021-06-05 NOTE — Plan of Care (Signed)
  Problem: Education: Goal: Knowledge of General Education information will improve Description: Including pain rating scale, medication(s)/side effects and non-pharmacologic comfort measures 06/05/2021 1032 by Cristela Blue, RN Outcome: Progressing 06/05/2021 1032 by Cristela Blue, RN Outcome: Progressing   Problem: Health Behavior/Discharge Planning: Goal: Ability to manage health-related needs will improve 06/05/2021 1032 by Cristela Blue, RN Outcome: Progressing 06/05/2021 1032 by Cristela Blue, RN Outcome: Progressing   Problem: Clinical Measurements: Goal: Ability to maintain clinical measurements within normal limits will improve 06/05/2021 1032 by Cristela Blue, RN Outcome: Progressing 06/05/2021 1032 by Cristela Blue, RN Outcome: Progressing Goal: Will remain free from infection 06/05/2021 1032 by Cristela Blue, RN Outcome: Progressing 06/05/2021 1032 by Cristela Blue, RN Outcome: Progressing Goal: Diagnostic test results will improve 06/05/2021 1032 by Cristela Blue, RN Outcome: Progressing 06/05/2021 1032 by Cristela Blue, RN Outcome: Progressing Goal: Respiratory complications will improve 06/05/2021 1032 by Cristela Blue, RN Outcome: Progressing 06/05/2021 1032 by Cristela Blue, RN Outcome: Progressing Goal: Cardiovascular complication will be avoided 06/05/2021 1032 by Cristela Blue, RN Outcome: Progressing 06/05/2021 1032 by Cristela Blue, RN Outcome: Progressing   Problem: Activity: Goal: Risk for activity intolerance will decrease 06/05/2021 1032 by Cristela Blue, RN Outcome: Progressing 06/05/2021 1032 by Cristela Blue, RN Outcome: Progressing   Problem: Nutrition: Goal: Adequate nutrition will be maintained 06/05/2021 1032 by Cristela Blue, RN Outcome: Progressing 06/05/2021 1032 by Cristela Blue, RN Outcome: Progressing   Problem: Coping: Goal: Level of anxiety will decrease 06/05/2021 1032 by Cristela Blue, RN Outcome:  Progressing 06/05/2021 1032 by Cristela Blue, RN Outcome: Progressing   Problem: Elimination: Goal: Will not experience complications related to bowel motility 06/05/2021 1032 by Cristela Blue, RN Outcome: Progressing 06/05/2021 1032 by Cristela Blue, RN Outcome: Progressing Goal: Will not experience complications related to urinary retention 06/05/2021 1032 by Cristela Blue, RN Outcome: Progressing 06/05/2021 1032 by Cristela Blue, RN Outcome: Progressing   Problem: Pain Managment: Goal: General experience of comfort will improve 06/05/2021 1032 by Cristela Blue, RN Outcome: Progressing 06/05/2021 1032 by Cristela Blue, RN Outcome: Progressing   Problem: Safety: Goal: Ability to remain free from injury will improve 06/05/2021 1032 by Cristela Blue, RN Outcome: Progressing 06/05/2021 1032 by Cristela Blue, RN Outcome: Progressing   Problem: Skin Integrity: Goal: Risk for impaired skin integrity will decrease 06/05/2021 1032 by Cristela Blue, RN Outcome: Progressing 06/05/2021 1032 by Cristela Blue, RN Outcome: Progressing

## 2021-06-05 NOTE — Progress Notes (Signed)
This RN provided discharge instructions and teaching to the patient as well as the patient's wife. The patient and the patient's wife verbalized and demonstrated understanding of the provided instructions. All outstanding questions resolved. R hand PIV removed. Cannula intact. Pt tolerated well. Ria Comment, RN de-accessed Chest [place]. All belongings packed and in tow. Designer, fashion/clothing to transport patient to private vehicle at time of departure.

## 2021-06-05 NOTE — Discharge Summary (Signed)
Physician Discharge Summary  Richard Santos JKK:[id number] DOB: 30-Jul-1961 DOA: 06/03/2021  PCP: [name], MD  Admit date: 06/03/2021 Discharge date: 06/05/2021  Admitted From: Home Disposition: Home  Recommendations for Outpatient Follow-up:  Follow up with PCP in 1-2 weeks Follow-up with nephrology as directed Follow-up with oncology and radiation oncology as directed  Home Health: No Equipment/Devices: None  Discharge Condition: Stable CODE STATUS: Full Diet recommendation: Regular  Brief/Interim Summary: 60 y.o. 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 Patient was recently hospitalized for same and upon discharge his serum sodium was 125.   Admitted to stepdown status.  Nephrology consulted with recommendations for hypertonic saline.  Has been on 3% saline since admission at a rate of 25 cc/h.  Sodium level slowly improving.  Per nephrology recommendations we will continue 3% saline until sodium level greater than 125 at that time transition to fluid restriction and salt tablets with consideration for tolvaptan.  On day of discharge case was discussed with nephrology.  Okay for discharge from their standpoint.  Will give 1 dose of tolvaptan and continue salt tablets and fluid restriction upon discharge.  Demeclocycline being held.  Radiation oncology is aware and will continue to manage patient's hyponatremia as outpatient.   Discharge Diagnoses:  Principal Problem:   Hyponatremia Active Problems:   Tobacco abuse   Alcohol abuse   HTN (hypertension)   Small cell lung cancer, left upper lobe (HCC)   SIADH (syndrome of inappropriate ADH production)  (HCC)   Protein-calorie malnutrition, severe  Hyponatremia Most likely secondary to SIADH from patient's known small cell lung cancer Serum osmolality is 233 and urine sodium level is 39 Patient was placed on demeclocycline by oncology but has not started taking it Appreciate nephrology input Patient initially required hypertonic saline and every 4 hours sodiums.  Sodium level did improve, approaching reference range.  Hypertonic saline discontinued and patient started on salt tablets and fluid restriction.  On day of discharge after discussion with nephrology will give 1 dose of tolvaptan.  Radiation oncology aware and will continue to manage patient's hyponatremia as outpatient.  Demeclocycline is being held.  Discharged on fluid restriction and salt tablets.  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 Follow-up with oncology upon discharge  Discharge Instructions  Discharge Instructions     Diet - low sodium heart healthy   Complete by: As directed    Consume less than 1200cc fluid daily   Increase activity slowly   Complete by: As directed       Allergies as of 06/05/2021       Reactions   Bee Venom Swelling   Elemental Sulfur Other (See Comments)   Acute renal failure   Other Other (See Comments)   Chemo medicine- patient not sure about name - swelling & rashes   Sulfa Antibiotics    Namenda [memantine] Nausea Only   Nausea and fatigue        Medication List  STOP taking these medications    demeclocycline 300 MG tablet Commonly known as: DECLOMYCIN       TAKE these medications    acetaminophen 325 MG tablet Commonly known as: TYLENOL Take 2 tablets (650 mg total) by mouth every 6 (six) hours as needed for mild pain (or  Fever >/= 101).   aspirin EC 81 MG tablet Take 81 mg by mouth daily. Swallow whole.   Breztri Aerosphere 160-9-4.8 MCG/ACT Aero Generic drug: Budeson-Glycopyrrol-Formoterol INHALE 2 PUFFS INTO THE LUNGS IN THE MORNING AND AT BEDTIME.   famotidine 20 MG tablet Commonly known as: Pepcid Take 1 tablet (20 mg total) by mouth 2 (two) times daily.   multivitamin capsule Take 1 capsule by mouth daily.   ondansetron 8 MG disintegrating tablet Commonly known as: ZOFRAN-ODT Take 1 tablet (8 mg total) by mouth every 8 (eight) hours as needed for nausea or vomiting.   prochlorperazine 10 MG tablet Commonly known as: COMPAZINE Take 1 tablet (10 mg total) by mouth every 6 (six) hours as needed.   sodium chloride 1 g tablet Take 1 tablet (1 g total) by mouth 2 (two) times daily with a meal for 3 days.   traMADol 50 MG tablet Commonly known as: ULTRAM TAKE 1/2 TABLET BY MOUTH TWICE A DAY AS NEEDED FOR PAIN       ASK your doctor about these medications    sodium bicarbonate 650 MG tablet Take 1 tablet (650 mg total) by mouth 2 (two) times daily for 7 days.        Allergies  Allergen Reactions   Bee Venom Swelling   Elemental Sulfur Other (See Comments)    Acute renal failure   Other Other (See Comments)    Chemo medicine- patient not sure about name - swelling & rashes   Sulfa Antibiotics    Namenda [Memantine] Nausea Only    Nausea and fatigue    Consultations: Nephrology Oncology   Procedures/Studies: CT CHEST W CONTRAST  Result Date: 05/22/2021 CLINICAL DATA:  60 year old male with small cell lung cancer restaging. EXAM: CT CHEST, ABDOMEN, AND PELVIS WITH CONTRAST TECHNIQUE: Multidetector CT imaging of the chest, abdomen and pelvis was performed following the standard protocol during bolus administration of intravenous contrast. CONTRAST:  156mL OMNIPAQUE IOHEXOL 300 MG/ML  SOLN COMPARISON:  CT dated 04/18/2021. FINDINGS: CT CHEST FINDINGS Cardiovascular: There is no  cardiomegaly or pericardial effusion. There is coronary vascular calcification. Right-sided Port-A-Cath with tip at the cavoatrial junction. The thoracic aorta is unremarkable. The origins of the great vessels of the aortic arch appear patent. The central pulmonary arteries are unremarkable. Mediastinum/Nodes: No hilar or mediastinal adenopathy. The esophagus and the thyroid gland are grossly unremarkable. No mediastinal fluid collection. Lungs/Pleura: Post radiation changes of the left lung and left perihilar region with traction bronchiectasis and architectural distortion. Slight interval decrease in the size of the cavitary lesion in the lingula measuring up to 3.3 cm. There is a 7 x 8 mm nodular density in the medial left upper lobe (45/4) which has increased in size since the prior CT (previously measuring approximately 4 mm). A lobulated and somewhat cavitary lesion in the left upper lobe measures 12 x 18 mm similar to prior CT. Similar appearance of subpleural thickening and scarring along the posterior aspect of the superior segment of the left lower lobe. Biapical subpleural scarring. There is a 3 mm subpleural nodule in the anterior right upper lobe (50/4), increased in size since the prior CT. There is no  pleural effusion pneumothorax. The central airways are patent. Musculoskeletal: No axillary adenopathy. Similar appearance of a 4 mm nodule in the subcutaneous soft tissues of the right lateral chest wall (34/2). A 1.2 x 1.3 cm nodular density in the subcutaneous soft tissues of the left anterior chest wall (34/2) has increased in size (previously measuring approximately 6 x 8 mm). There is degenerative changes of the spine. Stable 5 mm sclerotic lesion in the T8, indeterminate. No acute osseous pathology. CT ABDOMEN PELVIS FINDINGS No intra-abdominal free air or free fluid. Hepatobiliary: No focal liver abnormality is seen. No gallstones, gallbladder wall thickening, or biliary dilatation. Pancreas:  Unremarkable. No pancreatic ductal dilatation or surrounding inflammatory changes. Spleen: Normal in size without focal abnormality. Adrenals/Urinary Tract: The right adrenal gland is unremarkable. Lobulated mass involving the left adrenal gland measures 4.2 x 2.8 cm (61/2), not significantly changed. There is a 2 cm right renal interpolar cyst. There is no hydronephrosis on either side. There is symmetric enhancement and excretion of contrast by both kidneys. The visualized ureters appear unremarkable. There is irregular and thickened appearance of the posterior bladder wall which is not evaluated on this CT but may be related to enlargement of the prostate gland and changes of TURP. Cystoscopy may provide better evaluation. Stomach/Bowel: There is sigmoid diverticulosis without active inflammatory changes. There is no bowel obstruction or active inflammation. The appendix is normal. Vascular/Lymphatic: Advanced aortoiliac atherosclerotic disease. The IVC is unremarkable. No portal venous gas. There is no adenopathy. Reproductive: Enlarged prostate gland with brachytherapy seeds . Other: None Musculoskeletal: Osteopenia with degenerative changes of the spine. No acute osseous pathology. IMPRESSION: 1. Post radiation changes of the left lung and left perihilar region with traction bronchiectasis and architectural distortion. Slight interval decrease in the size of the cavitary lesion in the lingula. 2. Interval increase in the size of pulmonary nodule in the medial left upper lobe. Additionally a subcutaneous nodule in the left anterior chest wall has increased since the prior CT. 3. No significant interval change in the size of the left adrenal mass. 4. Sigmoid diverticulosis. No bowel obstruction. Normal appendix. 5. Aortic Atherosclerosis (ICD10-I70.0). Electronically Signed   By: Anner Crete M.D.   On: 05/22/2021 19:20   MR Brain W Wo Contrast  Result Date: 05/16/2021 CLINICAL DATA:  Metastatic lung  cancer post radiation. EXAM: MRI HEAD WITHOUT AND WITH CONTRAST TECHNIQUE: Multiplanar, multiecho pulse sequences of the brain and surrounding structures were obtained without and with intravenous contrast. CONTRAST:  53mL GADAVIST GADOBUTROL 1 MMOL/ML IV SOLN COMPARISON:  MRI head 02/13/2021 FINDINGS: Brain: New lesions: 9 mm necrotic lesion in the left temporoparietal lobe with surrounding edema. Axial image 134 4 mm necrotic lesion right temporal lobe with minimal edema. Axial image 172 5 mm enhancing lesion right lateral basal ganglia/external capsule. Axial image 190 3.5 mm lesion left frontal lobe axial image   195 3 mm lesion left head of caudate axial image 200. This image shows restricted diffusion. Stable lesions: Treated lesion left cerebellum without recurrence. Small amount of associated susceptibility. Ventricle size normal. Vascular: Normal arterial flow voids. Small developmental venous anomaly right medial parietal lobe unchanged. Skull and upper cervical spine: No focal skeletal metastasis identified. Sinuses/Orbits: Mild mucosal edema paranasal sinuses. Bilateral mastoid effusion similar to the prior MRI. 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   On: 05/16/2021 12:47  CT ABDOMEN PELVIS W CONTRAST  Result Date: 05/22/2021 CLINICAL DATA:  59 year old male with small cell lung cancer restaging. EXAM: CT CHEST, ABDOMEN, AND PELVIS WITH CONTRAST TECHNIQUE: Multidetector CT imaging of the chest, abdomen and pelvis was performed following the standard protocol during bolus administration of intravenous contrast. CONTRAST:  157mL OMNIPAQUE IOHEXOL 300 MG/ML  SOLN COMPARISON:  CT dated 04/18/2021. FINDINGS: CT CHEST FINDINGS Cardiovascular: There is no cardiomegaly or pericardial effusion. There is coronary vascular calcification.  Right-sided Port-A-Cath with tip at the cavoatrial junction. The thoracic aorta is unremarkable. The origins of the great vessels of the aortic arch appear patent. The central pulmonary arteries are unremarkable. Mediastinum/Nodes: No hilar or mediastinal adenopathy. The esophagus and the thyroid gland are grossly unremarkable. No mediastinal fluid collection. Lungs/Pleura: Post radiation changes of the left lung and left perihilar region with traction bronchiectasis and architectural distortion. Slight interval decrease in the size of the cavitary lesion in the lingula measuring up to 3.3 cm. There is a 7 x 8 mm nodular density in the medial left upper lobe (45/4) which has increased in size since the prior CT (previously measuring approximately 4 mm). A lobulated and somewhat cavitary lesion in the left upper lobe measures 12 x 18 mm similar to prior CT. Similar appearance of subpleural thickening and scarring along the posterior aspect of the superior segment of the left lower lobe. Biapical subpleural scarring. There is a 3 mm subpleural nodule in the anterior right upper lobe (50/4), increased in size since the prior CT. There is no pleural effusion pneumothorax. The central airways are patent. Musculoskeletal: No axillary adenopathy. Similar appearance of a 4 mm nodule in the subcutaneous soft tissues of the right lateral chest wall (34/2). A 1.2 x 1.3 cm nodular density in the subcutaneous soft tissues of the left anterior chest wall (34/2) has increased in size (previously measuring approximately 6 x 8 mm). There is degenerative changes of the spine. Stable 5 mm sclerotic lesion in the T8, indeterminate. No acute osseous pathology. CT ABDOMEN PELVIS FINDINGS No intra-abdominal free air or free fluid. Hepatobiliary: No focal liver abnormality is seen. No gallstones, gallbladder wall thickening, or biliary dilatation. Pancreas: Unremarkable. No pancreatic ductal dilatation or surrounding inflammatory changes.  Spleen: Normal in size without focal abnormality. Adrenals/Urinary Tract: The right adrenal gland is unremarkable. Lobulated mass involving the left adrenal gland measures 4.2 x 2.8 cm (61/2), not significantly changed. There is a 2 cm right renal interpolar cyst. There is no hydronephrosis on either side. There is symmetric enhancement and excretion of contrast by both kidneys. The visualized ureters appear unremarkable. There is irregular and thickened appearance of the posterior bladder wall which is not evaluated on this CT but may be related to enlargement of the prostate gland and changes of TURP. Cystoscopy may provide better evaluation. Stomach/Bowel: There is sigmoid diverticulosis without active inflammatory changes. There is no bowel obstruction or active inflammation. The appendix is normal. Vascular/Lymphatic: Advanced aortoiliac atherosclerotic disease. The IVC is unremarkable. No portal venous gas. There is no adenopathy. Reproductive: Enlarged prostate gland with brachytherapy seeds . Other: None Musculoskeletal: Osteopenia with degenerative changes of the spine. No acute osseous pathology. IMPRESSION: 1. Post radiation changes of the left lung and left perihilar region with traction bronchiectasis and architectural distortion. Slight interval decrease in the size of the cavitary lesion in the lingula. 2. Interval increase in the size of pulmonary nodule in the medial left upper lobe. Additionally a subcutaneous nodule in the left anterior chest wall  has increased since the prior CT. 3. No significant interval change in the size of the left adrenal mass. 4. Sigmoid diverticulosis. No bowel obstruction. Normal appendix. 5. Aortic Atherosclerosis (ICD10-I70.0). Electronically Signed   By: Anner Crete M.D.   On: 05/22/2021 19:20   (Echo, Carotid, EGD, Colonoscopy, ERCP)    Subjective: Patient seen and examined on the day of discharge.  Stable in no distress.  Stable for discharge  home.  Discharge Exam: Vitals:   06/05/21 1006 06/05/21 1119  BP: 100/69 104/74  Pulse: 88 90  Resp:    Temp: 98.2 F (36.8 C) 98.8 F (37.1 C)  SpO2: 100% 100%   Vitals:   06/05/21 0147 06/05/21 0745 06/05/21 1006 06/05/21 1119  BP: 126/86 92/68 100/69 104/74  Pulse: 94 (!) 117 88 90  Resp: 17     Temp: 99.1 F (37.3 C) 97.7 F (36.5 C) 98.2 F (36.8 C) 98.8 F (37.1 C)  TempSrc: Oral Oral  Oral  SpO2: 100% 100% 100% 100%  Weight:      Height:        General: Pt is alert, awake, not in acute distress Cardiovascular: RRR, S1/S2 +, no rubs, no gallops Respiratory: CTA bilaterally, no wheezing, no rhonchi Abdominal: Soft, NT, ND, bowel sounds + Extremities: no edema, no cyanosis    The results of significant diagnostics from this hospitalization (including imaging, microbiology, ancillary and laboratory) are listed below for reference.     Microbiology: Recent Results (from the past 240 hour(s))  Resp Panel by RT-PCR (Flu A&B, Covid) Nasopharyngeal Swab     Status: None   Collection Time: 06/03/21  2:23 PM   Specimen: Nasopharyngeal Swab; Nasopharyngeal(NP) swabs in vial transport medium  Result Value Ref Range Status   SARS Coronavirus 2 by RT PCR NEGATIVE NEGATIVE Final    Comment: (NOTE) SARS-CoV-2 target nucleic acids are NOT DETECTED.  The SARS-CoV-2 RNA is generally detectable in upper respiratory specimens during the acute phase of infection. The lowest concentration of SARS-CoV-2 viral copies this assay can detect is 138 copies/mL. A negative result does not preclude SARS-Cov-2 infection and should not be used as the sole basis for treatment or other patient management decisions. A negative result may occur with  improper specimen collection/handling, submission of specimen other than nasopharyngeal swab, presence of viral mutation(s) within the areas targeted by this assay, and inadequate number of viral copies(<138 copies/mL). A negative result must  be combined with clinical observations, patient history, and epidemiological information. The expected result is Negative.  Fact Sheet for Patients:  EntrepreneurPulse.com.au  Fact Sheet for Healthcare Providers:  IncredibleEmployment.be  This test is no t yet approved or cleared by the [place] FDA and  has been authorized for detection and/or diagnosis of SARS-CoV-2 by FDA under an Emergency Use Authorization (EUA). This EUA will remain  in effect (meaning this test can be used) for the duration of the COVID-19 declaration under Section 564(b)(1) of the Act, 21 U.S.C.section 360bbb-3(b)(1), unless the authorization is terminated  or revoked sooner.       Influenza A by PCR NEGATIVE NEGATIVE Final   Influenza B by PCR NEGATIVE NEGATIVE Final    Comment: (NOTE) The Xpert Xpress SARS-CoV-2/FLU/RSV plus assay is intended as an aid in the diagnosis of influenza from Nasopharyngeal swab specimens and should not be used as a sole basis for treatment. Nasal washings and aspirates are unacceptable for Xpert Xpress SARS-CoV-2/FLU/RSV testing.  Fact Sheet for Patients: EntrepreneurPulse.com.au  Fact Sheet for Healthcare  Providers: IncredibleEmployment.be  This test is not yet approved or cleared by the [place] and has been authorized for detection and/or diagnosis of SARS-CoV-2 by FDA under an Emergency Use Authorization (EUA). This EUA will remain in effect (meaning this test can be used) for the duration of the COVID-19 declaration under Section 564(b)(1) of the Act, 21 U.S.C. section 360bbb-3(b)(1), unless the authorization is terminated or revoked.  Performed at [facility], [address] [place], [place] [address]   MRSA Next Gen by PCR, Nasal     Status: None   Collection Time: 06/03/21  5:41 PM   Specimen: Nasal Mucosa; Nasal Swab  Result Value Ref Range Status   MRSA by  PCR Next Gen NOT DETECTED NOT DETECTED Final    Comment: (NOTE) The GeneXpert MRSA Assay (FDA approved for NASAL specimens only), is one component of a comprehensive MRSA colonization surveillance program. It is not intended to diagnose MRSA infection nor to guide or monitor treatment for MRSA infections. Test performance is not FDA approved in patients less than 21 years old. Performed at [facility], [address] [place], [place] [address]      Labs: BNP (last 3 results) No results for input(s): BNP in the last 8760 hours. Basic Metabolic Panel: Recent Labs  Lab 06/03/21 1002 06/03/21 1422 06/03/21 1845 06/04/21 0630 06/04/21 1232 06/04/21 1700 06/04/21 2119 06/05/21 0056 06/05/21 0531 06/05/21 0945  NA 115* 114*   < > 121*   < > 126* 128* 126* 125* 127*  K 4.4  --   --  3.9  --   --   --   --   --  3.7  CL 81*  --   --  87*  --   --   --   --   --  93*  CO2 25  --   --  26  --   --   --   --   --  26  GLUCOSE 103*  --   --  101*  --   --   --   --   --  115*  BUN 6  --   --  5*  --   --   --   --   --  8  CREATININE 0.75  --   --  0.62  --   --   --   --   --  0.65  CALCIUM 9.7  --   --  8.9  --   --   --   --   --  9.2  MG  --  1.5*  --   --   --   --   --   --   --   --    < > = values in this interval not displayed.   Liver Function Tests: Recent Labs  Lab 06/03/21 1002 06/05/21 0945  AST 36 36  ALT 26 26  ALKPHOS 95 79  BILITOT 0.5 0.4  PROT 7.3 7.2  ALBUMIN 3.7 3.8   No results for input(s): LIPASE, AMYLASE in the last 168 hours. No results for input(s): AMMONIA in the last 168 hours. CBC: Recent Labs  Lab 06/03/21 1002 06/03/21 1845 06/04/21 0630  WBC 5.6 4.9 5.0  NEUTROABS 4.4  --   --   HGB 11.9* 11.7* 12.5*  HCT 31.7* RESULTS UNAVAILABLE DUE TO INTERFERING SUBSTANCE 34.1*  MCV 94.3 RESULTS UNAVAILABLE DUE TO INTERFERING SUBSTANCE 96.1  PLT 245 199  210   Cardiac Enzymes: No results for input(s): CKTOTAL, CKMB, CKMBINDEX,  TROPONINI in the last 168 hours. BNP: Invalid input(s): POCBNP CBG: Recent Labs  Lab 06/03/21 1823  GLUCAP 110*   D-Dimer No results for input(s): DDIMER in the last 72 hours. Hgb A1c No results for input(s): HGBA1C in the last 72 hours. Lipid Profile No results for input(s): CHOL, HDL, LDLCALC, TRIG, CHOLHDL, LDLDIRECT in the last 72 hours. Thyroid function studies No results for input(s): TSH, T4TOTAL, T3FREE, THYROIDAB in the last 72 hours.  Invalid input(s): FREET3 Anemia work up No results for input(s): VITAMINB12, FOLATE, FERRITIN, TIBC, IRON, RETICCTPCT in the last 72 hours. Urinalysis    Component Value Date/Time   COLORURINE YELLOW (A) 06/03/2021 1423   APPEARANCEUR CLEAR (A) 06/03/2021 1423   LABSPEC 1.011 06/03/2021 1423   PHURINE 6.0 06/03/2021 1423   GLUCOSEU 50 (A) 06/03/2021 1423   GLUCOSEU NEGATIVE 12/12/2019 1506   HGBUR NEGATIVE 06/03/2021 [place] 06/03/2021 1423   KETONESUR NEGATIVE 06/03/2021 1423   PROTEINUR NEGATIVE 06/03/2021 1423   UROBILINOGEN 0.2 12/12/2019 1506   NITRITE NEGATIVE 06/03/2021 1423   LEUKOCYTESUR NEGATIVE 06/03/2021 1423   Sepsis Labs Invalid input(s): PROCALCITONIN,  WBC,  LACTICIDVEN Microbiology Recent Results (from the past 240 hour(s))  Resp Panel by RT-PCR (Flu A&B, Covid) Nasopharyngeal Swab     Status: None   Collection Time: 06/03/21  2:23 PM   Specimen: Nasopharyngeal Swab; Nasopharyngeal(NP) swabs in vial transport medium  Result Value Ref Range Status   SARS Coronavirus 2 by RT PCR NEGATIVE NEGATIVE Final    Comment: (NOTE) SARS-CoV-2 target nucleic acids are NOT DETECTED.  The SARS-CoV-2 RNA is generally detectable in upper respiratory specimens during the acute phase of infection. The lowest concentration of SARS-CoV-2 viral copies this assay can detect is 138 copies/mL. A negative result does not preclude SARS-Cov-2 infection and should not be used as the sole basis for treatment  or other patient management decisions. A negative result may occur with  improper specimen collection/handling, submission of specimen other than nasopharyngeal swab, presence of viral mutation(s) within the areas targeted by this assay, and inadequate number of viral copies(<138 copies/mL). A negative result must be combined with clinical observations, patient history, and epidemiological information. The expected result is Negative.  Fact Sheet for Patients:  EntrepreneurPulse.com.au  Fact Sheet for Healthcare Providers:  IncredibleEmployment.be  This test is no t yet approved or cleared by the [place] FDA and  has been authorized for detection and/or diagnosis of SARS-CoV-2 by FDA under an Emergency Use Authorization (EUA). This EUA will remain  in effect (meaning this test can be used) for the duration of the COVID-19 declaration under Section 564(b)(1) of the Act, 21 U.S.C.section 360bbb-3(b)(1), unless the authorization is terminated  or revoked sooner.       Influenza A by PCR NEGATIVE NEGATIVE Final   Influenza B by PCR NEGATIVE NEGATIVE Final    Comment: (NOTE) The Xpert Xpress SARS-CoV-2/FLU/RSV plus assay is intended as an aid in the diagnosis of influenza from Nasopharyngeal swab specimens and should not be used as a sole basis for treatment. Nasal washings and aspirates are unacceptable for Xpert Xpress SARS-CoV-2/FLU/RSV testing.  Fact Sheet for Patients: EntrepreneurPulse.com.au  Fact Sheet for Healthcare Providers: IncredibleEmployment.be  This test is not yet approved or cleared by the [place] FDA and has been authorized for detection and/or diagnosis of SARS-CoV-2 by FDA under an Emergency Use Authorization (EUA). This EUA will remain in  effect (meaning this test can be used) for the duration of the COVID-19 declaration under Section 564(b)(1) of the Act, 21 U.S.C. section  360bbb-3(b)(1), unless the authorization is terminated or revoked.  Performed at [facility], [address] [place], [place] [address]   MRSA Next Gen by PCR, Nasal     Status: None   Collection Time: 06/03/21  5:41 PM   Specimen: Nasal Mucosa; Nasal Swab  Result Value Ref Range Status   MRSA by PCR Next Gen NOT DETECTED NOT DETECTED Final    Comment: (NOTE) The GeneXpert MRSA Assay (FDA approved for NASAL specimens only), is one component of a comprehensive MRSA colonization surveillance program. It is not intended to diagnose MRSA infection nor to guide or monitor treatment for MRSA infections. Test performance is not FDA approved in patients less than 39 years old. Performed at [facility], [address], [place], [place] [address]      Time coordinating discharge: Over 30 minutes  SIGNED:   Sidney Ace, MD  [facility] 06/05/2021, 3:05 PM Pager   If 7PM-7AM, please contact night-coverage

## 2021-06-05 NOTE — Progress Notes (Signed)
DAYLEN LIPSKY   DOB:[birth year]   NL#:[id number]    Subjective: No acute events overnight.  Patient denies any nausea vomiting abdominal pain.  Denies any headaches.  No further mental status changes.  Objective:  Vitals:   06/05/21 1006 06/05/21 1119  BP: 100/69 104/74  Pulse: 88 90  Resp:    Temp: 98.2 F (36.8 C) 98.8 F (37.1 C)  SpO2: 100% 100%     Intake/Output Summary (Last 24 hours) at 06/05/2021 1633 Last data filed at 06/05/2021 0950 Gross per 24 hour  Intake 550.27 ml  Output 675 ml  Net -124.73 ml    Physical Exam Vitals and nursing note reviewed.  Constitutional:      Comments: Patient resting in the bed.    Alone   HENT:     Head: Normocephalic and atraumatic.     Mouth/Throat:     Mouth: Mucous membranes are moist.     Pharynx: No oropharyngeal exudate.  Eyes:     Pupils: Pupils are equal, round, and reactive to light.  Cardiovascular:     Rate and Rhythm: Normal rate and regular rhythm.  Pulmonary:     Effort: No respiratory distress.     Breath sounds: No wheezing.     Comments: Decreased breath sounds bilaterally at bases.  No wheeze or crackles Abdominal:     General: Bowel sounds are normal. There is no distension.     Palpations: Abdomen is soft. There is no mass.     Tenderness: There is no abdominal tenderness. There is no guarding or rebound.  Musculoskeletal:        General: No tenderness. Normal range of motion.     Cervical back: Normal range of motion and neck supple.  Skin:    General: Skin is warm.  Neurological:     Mental Status: He is alert and oriented to person, place, and time.  Psychiatric:        Mood and Affect: Affect normal.        Judgment: Judgment normal.     Labs:  Lab Results  Component Value Date   WBC 5.0 06/04/2021   HGB 12.5 (L) 06/04/2021   HCT 34.1 (L) 06/04/2021   MCV 96.1 06/04/2021   PLT 210 06/04/2021   NEUTROABS 4.4 06/03/2021    Lab Results  Component Value Date   NA 127 (L) 06/05/2021   K  3.7 06/05/2021   CL 93 (L) 06/05/2021   CO2 26 06/05/2021    Studies:  No results found.  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  S/p nephrology; s/p hypertonic saline-sodium improved to 124.  Discussed with nephrology regarding tolvaptan; received 1 dose prior to discharge.   #Extensive stage small cell lung cancer-currently on maintenance immunotherapy as per Dr. Julien Nordmann; radiation to the left adrenal enlarging mass-Per Dr. Lisbeth Renshaw.   #Patient is clinically stable from oncology standpoint to be discharged.  Discussed with Dr.Sreenath and Dr.Kolluru.  Also informed Dr. Julien Nordmann of the plan.   Cammie Sickle, MD 06/05/2021  4:33 PM

## 2021-06-05 NOTE — Progress Notes (Signed)
[facility]  ROUNDING NOTE   Subjective:   Patient laying in bed States he feels good this morning Current sodium 125    Objective:  Vital signs in last 24 hours:  Temp:  [97.7 F (36.5 C)-99.1 F (37.3 C)] 98.2 F (36.8 C) (07/14 1006) Pulse Rate:  [88-117] 88 (07/14 1006) Resp:  [13-23] 17 (07/14 0147) BP: (92-135)/(63-105) 100/69 (07/14 1006) SpO2:  [97 %-100 %] 100 % (07/14 1006)  Weight change:  Filed Weights   06/03/21 1348  Weight: 70 kg    Intake/Output: I/O last 3 completed shifts: In: 585.3 [I.V.:585.3] Out: 1675 [Urine:1675]   Intake/Output this shift:  Total I/O In: 240 [P.O.:240] Out: -   Physical Exam: General: NAD,   Head: Normocephalic, atraumatic. Moist oral mucosal membranes  Eyes: Anicteric  Lungs:  Clear to auscultation, normal breathing effort  Heart: Regular rate and rhythm  Abdomen:  Soft, nontender  Extremities: No peripheral edema.  Neurologic: Nonfocal, moving all four extremities  Skin: No lesions        Basic Metabolic Panel: Recent Labs  Lab 06/03/21 1002 06/03/21 1422 06/03/21 1845 06/04/21 0630 06/04/21 1232 06/04/21 1700 06/04/21 2119 06/05/21 0056 06/05/21 0531 06/05/21 0945  NA 115* 114*   < > 121*   < > 126* 128* 126* 125* 127*  K 4.4  --   --  3.9  --   --   --   --   --  3.7  CL 81*  --   --  87*  --   --   --   --   --  93*  CO2 25  --   --  26  --   --   --   --   --  26  GLUCOSE 103*  --   --  101*  --   --   --   --   --  115*  BUN 6  --   --  5*  --   --   --   --   --  8  CREATININE 0.75  --   --  0.62  --   --   --   --   --  0.65  CALCIUM 9.7  --   --  8.9  --   --   --   --   --  9.2  MG  --  1.5*  --   --   --   --   --   --   --   --    < > = values in this interval not displayed.     Liver Function Tests: Recent Labs  Lab 06/03/21 1002 06/05/21 0945  AST 36 36  ALT 26 26  ALKPHOS 95 79  BILITOT 0.5 0.4  PROT 7.3 7.2  ALBUMIN 3.7 3.8    No results for input(s):  LIPASE, AMYLASE in the last 168 hours. No results for input(s): AMMONIA in the last 168 hours.  CBC: Recent Labs  Lab 06/03/21 1002 06/03/21 1845 06/04/21 0630  WBC 5.6 4.9 5.0  NEUTROABS 4.4  --   --   HGB 11.9* 11.7* 12.5*  HCT 31.7* RESULTS UNAVAILABLE DUE TO INTERFERING SUBSTANCE 34.1*  MCV 94.3 RESULTS UNAVAILABLE DUE TO INTERFERING SUBSTANCE 96.1  PLT 245 199 210     Cardiac Enzymes: No results for input(s): CKTOTAL, CKMB, CKMBINDEX, TROPONINI in the last 168 hours.  BNP: Invalid input(s): POCBNP  CBG: Recent Labs  Lab 06/03/21  [place]     Microbiology: Results for orders placed or performed during the hospital encounter of 06/03/21  Resp Panel by RT-PCR (Flu A&B, Covid) Nasopharyngeal Swab     Status: None   Collection Time: 06/03/21  2:23 PM   Specimen: Nasopharyngeal Swab; Nasopharyngeal(NP) swabs in vial transport medium  Result Value Ref Range Status   SARS Coronavirus 2 by RT PCR NEGATIVE NEGATIVE Final    Comment: (NOTE) SARS-CoV-2 target nucleic acids are NOT DETECTED.  The SARS-CoV-2 RNA is generally detectable in upper respiratory specimens during the acute phase of infection. The lowest concentration of SARS-CoV-2 viral copies this assay can detect is 138 copies/mL. A negative result does not preclude SARS-Cov-2 infection and should not be used as the sole basis for treatment or other patient management decisions. A negative result may occur with  improper specimen collection/handling, submission of specimen other than nasopharyngeal swab, presence of viral mutation(s) within the areas targeted by this assay, and inadequate number of viral copies(<138 copies/mL). A negative result must be combined with clinical observations, patient history, and epidemiological information. The expected result is Negative.  Fact Sheet for Patients:  EntrepreneurPulse.com.au  Fact Sheet for Healthcare Providers:   IncredibleEmployment.be  This test is no t yet approved or cleared by the [place] FDA and  has been authorized for detection and/or diagnosis of SARS-CoV-2 by FDA under an Emergency Use Authorization (EUA). This EUA will remain  in effect (meaning this test can be used) for the duration of the COVID-19 declaration under Section 564(b)(1) of the Act, 21 U.S.C.section 360bbb-3(b)(1), unless the authorization is terminated  or revoked sooner.       Influenza A by PCR NEGATIVE NEGATIVE Final   Influenza B by PCR NEGATIVE NEGATIVE Final    Comment: (NOTE) The Xpert Xpress SARS-CoV-2/FLU/RSV plus assay is intended as an aid in the diagnosis of influenza from Nasopharyngeal swab specimens and should not be used as a sole basis for treatment. Nasal washings and aspirates are unacceptable for Xpert Xpress SARS-CoV-2/FLU/RSV testing.  Fact Sheet for Patients: EntrepreneurPulse.com.au  Fact Sheet for Healthcare Providers: IncredibleEmployment.be  This test is not yet approved or cleared by the [place] FDA and has been authorized for detection and/or diagnosis of SARS-CoV-2 by FDA under an Emergency Use Authorization (EUA). This EUA will remain in effect (meaning this test can be used) for the duration of the COVID-19 declaration under Section 564(b)(1) of the Act, 21 U.S.C. section 360bbb-3(b)(1), unless the authorization is terminated or revoked.  Performed at [facility], [address] [place], [place] [address]   MRSA Next Gen by PCR, Nasal     Status: None   Collection Time: 06/03/21  5:41 PM   Specimen: Nasal Mucosa; Nasal Swab  Result Value Ref Range Status   MRSA by PCR Next Gen NOT DETECTED NOT DETECTED Final    Comment: (NOTE) The GeneXpert MRSA Assay (FDA approved for NASAL specimens only), is one component of a comprehensive MRSA colonization surveillance program. It is not intended to  diagnose MRSA infection nor to guide or monitor treatment for MRSA infections. Test performance is not FDA approved in patients less than 55 years old. Performed at [facility], [address] [place], [place] [address]     Coagulation Studies: No results for input(s): LABPROT, INR in the last 72 hours.  Urinalysis: Recent Labs    06/03/21 1423  COLORURINE YELLOW*  LABSPEC 1.011  PHURINE 6.0  GLUCOSEU 50*  HGBUR  NEGATIVE  BILIRUBINUR NEGATIVE  KETONESUR NEGATIVE  PROTEINUR NEGATIVE  NITRITE NEGATIVE  LEUKOCYTESUR NEGATIVE       Imaging: No results found.   Medications:      aspirin EC  81 mg Oral Daily   Chlorhexidine Gluconate Cloth  6 each Topical Daily   enoxaparin (LOVENOX) injection  40 mg Subcutaneous Q24H   famotidine  20 mg Oral BID   feeding supplement  1 Container Oral TID BM   folic acid  1 mg Oral Daily   mometasone-formoterol  2 puff Inhalation BID   And   umeclidinium bromide  1 puff Inhalation Daily   multivitamin with minerals  1 tablet Oral Daily   sodium chloride  1 g Oral BID WC   thiamine  100 mg Oral Daily   Or   thiamine  100 mg Intravenous Daily   acetaminophen, LORazepam **OR** LORazepam, ondansetron **OR** ondansetron (ZOFRAN) IV, traMADol  Assessment/ Plan:  Richard Santos is a 60 y.o. white male with small cell carcinoma with metastasis, hypertension, hyperlipidemia, GERD, prostate cancer, who was admitted to [facility] on 06/03/2021 for Hyponatremia [E87.1]   Hyponatremia: Will order Tolvaptan 15 mg and initiate protocol. We discussed with Dr Inda Merlin who will continue to mange with Tolvaptan outpatient. Continue salt tabs and fluid restriction to 32 oz daily. IVF stopped  SIADH- restrict fluid intake and salt tabs at discharge. Further management with outpatient physician  Hypertension: at goal 100/69    - Currently holding demeclocycline.    LOS: 2   7/14/202210:59 AM

## 2021-06-05 NOTE — Progress Notes (Signed)
   06/05/21 0745  Assess: MEWS Score  Temp 97.7 F (36.5 C)  BP 92/68  Pulse Rate (!) 117  Level of Consciousness Alert  SpO2 100 %  O2 Device Room Air  Assess: MEWS Score  MEWS Temp 0  MEWS Systolic 1  MEWS Pulse 2  MEWS RR 0  MEWS LOC 0  MEWS Score 3  MEWS Score Color Yellow  Assess: if the MEWS score is Yellow or Red  Were vital signs taken at a resting state? Yes  Focused Assessment No change from prior assessment  Does the patient meet 2 or more of the SIRS criteria? No  MEWS guidelines implemented *See Row Information* Yes  Treat  MEWS Interventions Escalated (See documentation below)  Pain Scale 0-10  Pain Score 0  Take Vital Signs  Increase Vital Sign Frequency  Yellow: Q 2hr X 2 then Q 4hr X 2, if remains yellow, continue Q 4hrs  Escalate  MEWS: Escalate Yellow: discuss with charge nurse/RN and consider discussing with provider and RRT  Notify: Charge Nurse/RN  Name of Charge Nurse/RN Notified Janett Billow Christmas  Date Charge Nurse/RN Notified 06/05/21  Time Charge Nurse/RN Notified 0750  Document  Patient Outcome Stabilized after interventions  Progress note created (see row info) Yes  Assess: SIRS CRITERIA  SIRS Temperature  0  SIRS Pulse 1  SIRS Respirations  0  SIRS WBC 0  SIRS Score Sum  1

## 2021-06-06 ENCOUNTER — Ambulatory Visit: Payer: 59

## 2021-06-06 ENCOUNTER — Telehealth: Payer: Self-pay

## 2021-06-06 ENCOUNTER — Encounter: Payer: Self-pay | Admitting: Internal Medicine

## 2021-06-06 ENCOUNTER — Ambulatory Visit
Admission: RE | Admit: 2021-06-06 | Discharge: 2021-06-06 | Disposition: A | Discharge status: Home / Self Care | Payer: 59 | Source: Ambulatory Visit | Attending: Radiation Oncology | Admitting: Radiation Oncology

## 2021-06-06 DIAGNOSIS — Z5112 Encounter for antineoplastic immunotherapy: Secondary | ICD-10-CM | POA: Diagnosis not present

## 2021-06-06 NOTE — Telephone Encounter (Signed)
Transition Care Management Follow-up Telephone Call Date of discharge and from where: 06/05/2021 from [facility] How have you been since you were released from the hospital? Trying to get my energy back. Any questions or concerns? No  Items Reviewed: Did the pt receive and understand the discharge instructions provided? Yes  Medications obtained and verified? Yes  Other? No  Any new allergies since your discharge? No  Dietary orders reviewed? Yes, regular diet, consume less than 1200 cc fluid daily Do you have support at home? Yes   Home Care and Equipment/Supplies: Were home health services ordered? no If so, what is the name of the agency? N/a  Has the agency set up a time to come to the patient's home? no Were any new equipment or medical supplies ordered?  No What is the name of the medical supply agency? no Were you able to get the supplies/equipment? no Do you have any questions related to the use of the equipment or supplies? No  Functional Questionnaire: (I = Independent and D = Dependent) ADLs: I  Bathing/Dressing- I  Meal Prep- I  Eating- I  Maintaining continence- I  Transferring/Ambulation- I  Managing Meds- I  Follow up appointments reviewed:  PCP Hospital f/u appt confirmed? Yes  Scheduled to see Cathlean Cower, MD on 06/16/2021 @ 11:20 am. [facility] f/u appt confirmed? Yes  Scheduled to see Oncology  Are transportation arrangements needed? No  If their condition worsens, is the pt aware to call PCP or go to the Emergency Dept.? Yes Was the patient provided with contact information for the PCP's office or ED? Yes Was to pt encouraged to call back with questions or concerns? Yes

## 2021-06-09 ENCOUNTER — Other Ambulatory Visit: Payer: Self-pay

## 2021-06-09 ENCOUNTER — Inpatient Hospital Stay: Payer: 59

## 2021-06-09 ENCOUNTER — Encounter: Payer: Self-pay | Admitting: Radiation Oncology

## 2021-06-09 ENCOUNTER — Ambulatory Visit
Admission: RE | Admit: 2021-06-09 | Discharge: 2021-06-09 | Disposition: A | Discharge status: Home / Self Care | Payer: 59 | Source: Ambulatory Visit | Attending: Radiation Oncology | Admitting: Radiation Oncology

## 2021-06-09 ENCOUNTER — Other Ambulatory Visit: Payer: 59

## 2021-06-09 DIAGNOSIS — C3412 Malignant neoplasm of upper lobe, left bronchus or lung: Secondary | ICD-10-CM

## 2021-06-09 DIAGNOSIS — Z5112 Encounter for antineoplastic immunotherapy: Secondary | ICD-10-CM | POA: Diagnosis not present

## 2021-06-09 DIAGNOSIS — Z95828 Presence of other vascular implants and grafts: Secondary | ICD-10-CM

## 2021-06-09 MED ORDER — HEPARIN SOD (PORK) LOCK FLUSH 100 UNIT/ML IV SOLN
500.0000 [IU] | Freq: Once | INTRAVENOUS | Status: AC
  Administered 2021-06-09: 500 [IU]
  Filled 2021-06-09: qty 5

## 2021-06-09 MED ORDER — SODIUM CHLORIDE 0.9% FLUSH
10.0000 mL | Freq: Once | INTRAVENOUS | Status: AC
  Administered 2021-06-09: 10 mL
  Filled 2021-06-09: qty 10

## 2021-06-10 ENCOUNTER — Ambulatory Visit: Payer: 59

## 2021-06-11 ENCOUNTER — Ambulatory Visit: Payer: 59 | Admitting: Internal Medicine

## 2021-06-11 ENCOUNTER — Ambulatory Visit: Payer: 59

## 2021-06-11 ENCOUNTER — Other Ambulatory Visit: Payer: 59

## 2021-06-12 IMAGING — CT CT ABD-PELV W/ CM
2 of 5 series · 12 of 36 positions shown, 15 images · IV contrast (omnipaque)
Comparison: 05/02/2020 PET-CT.  CT chest 04/11/2020.

CLINICAL DATA: Primary Cancer Type: Lung
TECHNIQUE: Multidetector CT imaging of the chest, abdomen and pelvis was
performed following the standard protocol during bolus
administration of intravenous contrast.

CONTRAST:  100mL OMNIPAQUE IOHEXOL 300 MG/ML  SOLN

[Series 2: cap with · axial · 0.81mm/px · z∈[-612,-47]mm · 9 of 143 slices shown, 12 images]
[im 15/143  mediastinal]
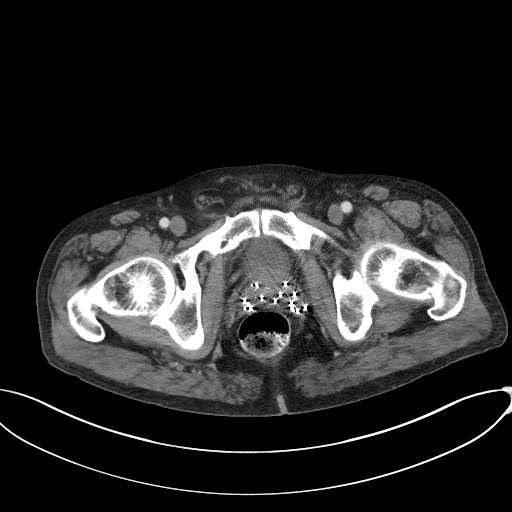
[im 15/143  lung]
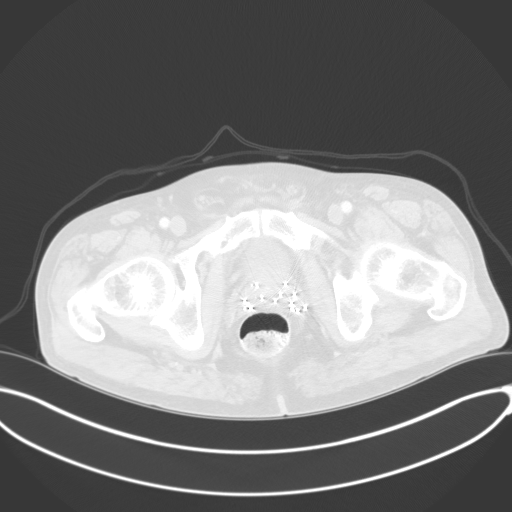
[im 29/143  lung]
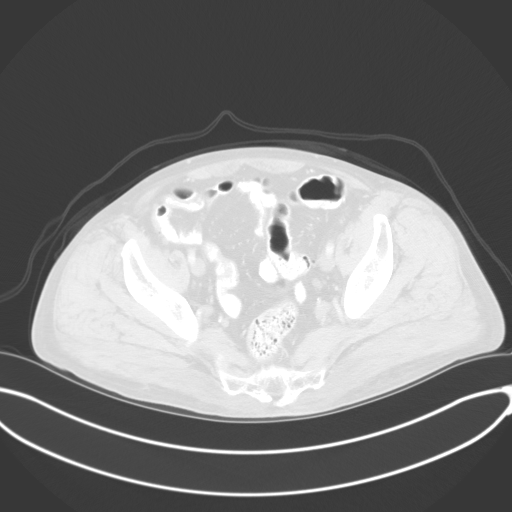
[im 43/143  lung]
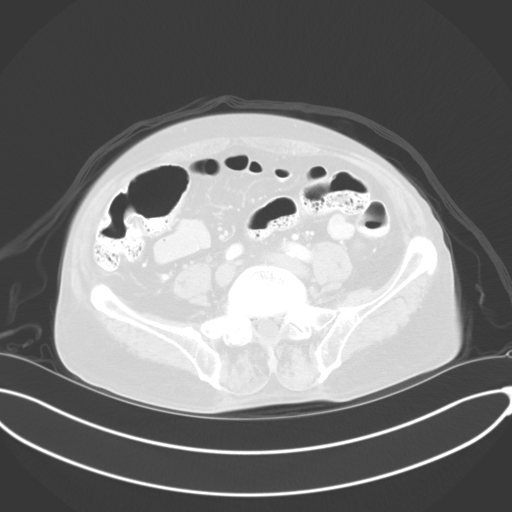
[im 57/143  lung]
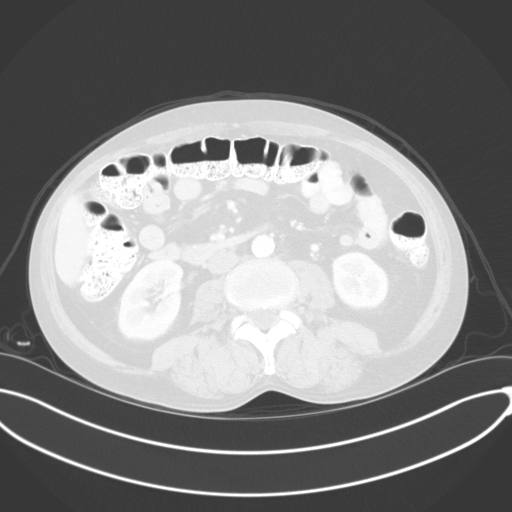
[im 72/143  mediastinal]
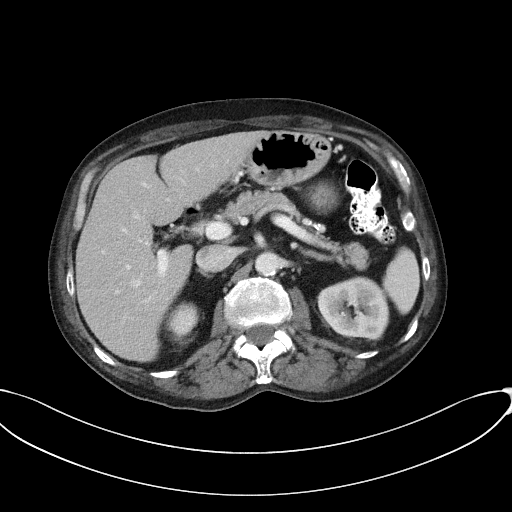
[im 72/143  lung]
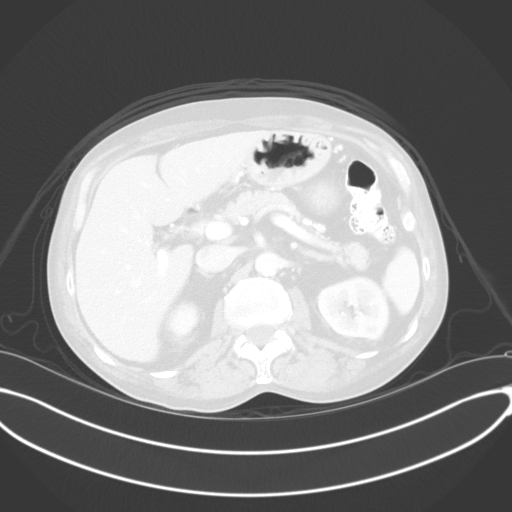
[im 86/143  lung]
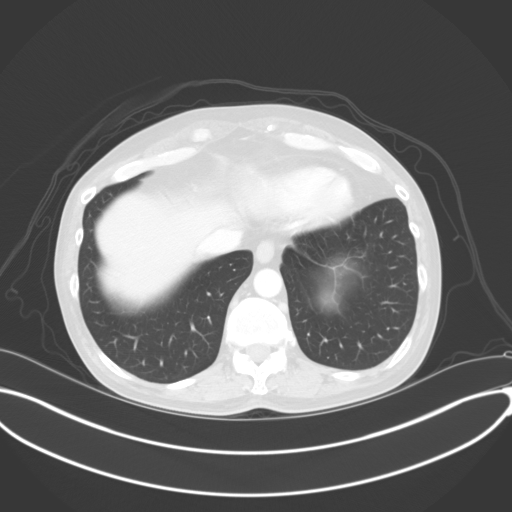
[im 100/143  lung]
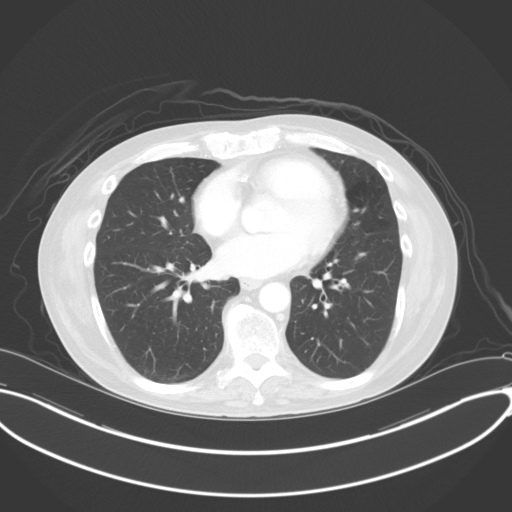
[im 114/143  lung]
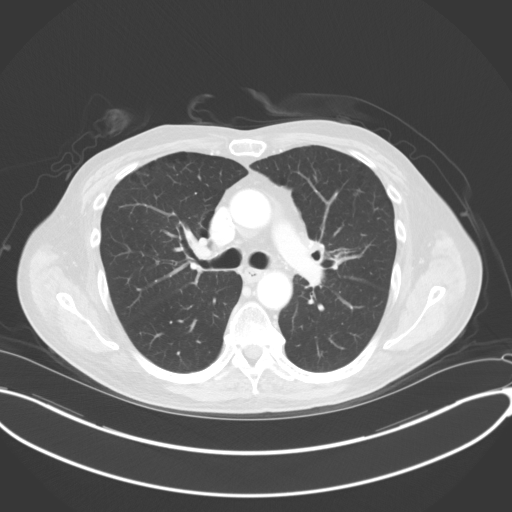
[im 128/143  mediastinal]
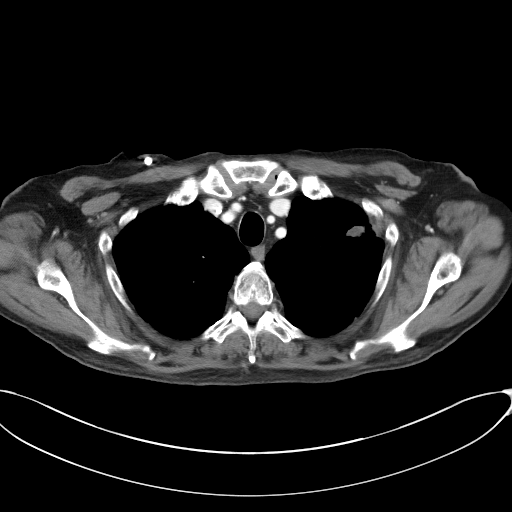
[im 128/143  lung]
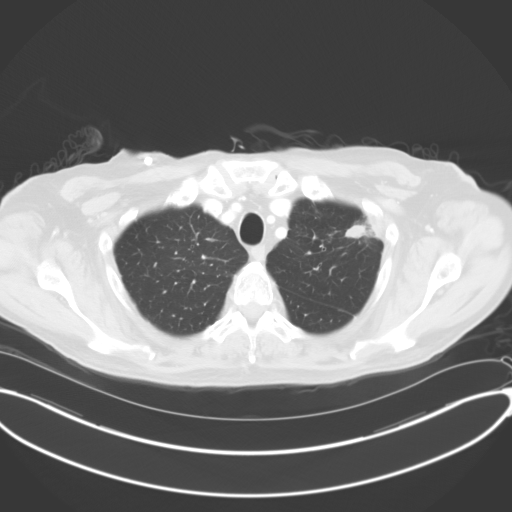

[Series 4: coronals · coronal · 0.79mm/px · 3 of 137 slices shown]
[im 28/137  lung]
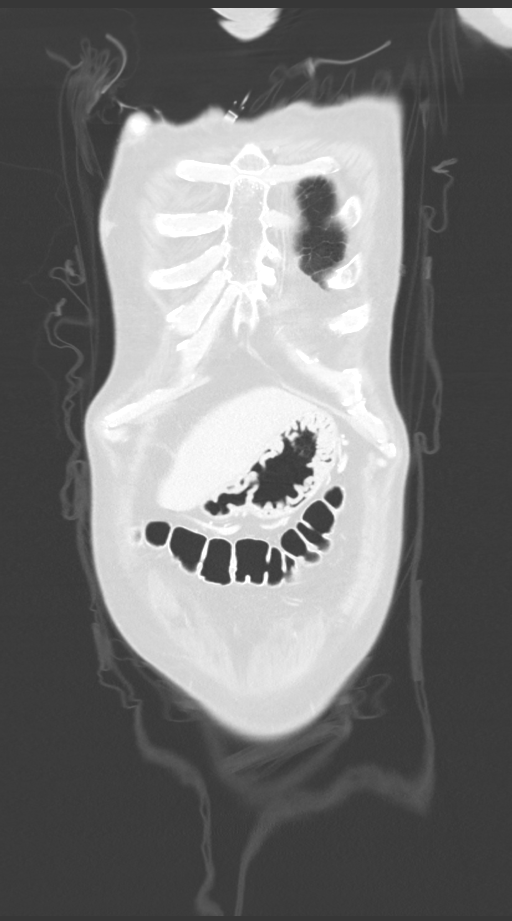
[im 55/137  lung]
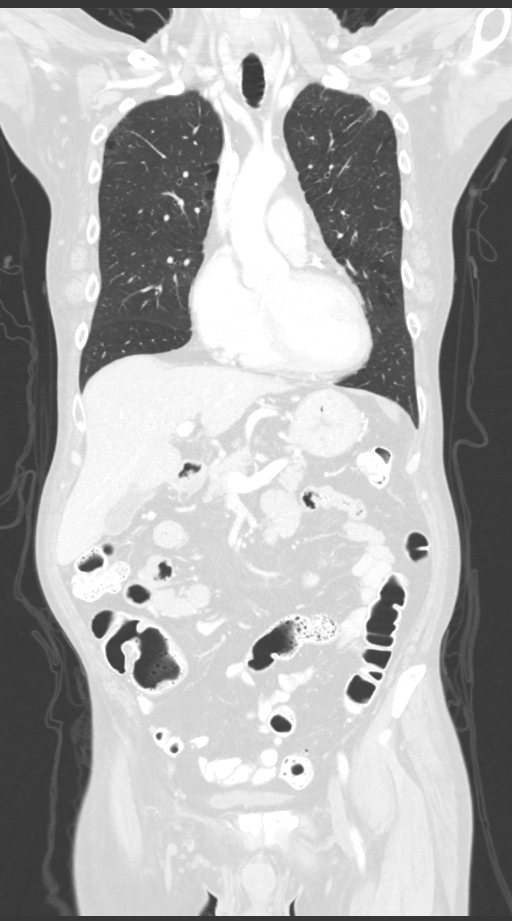
[im 82/137  lung]
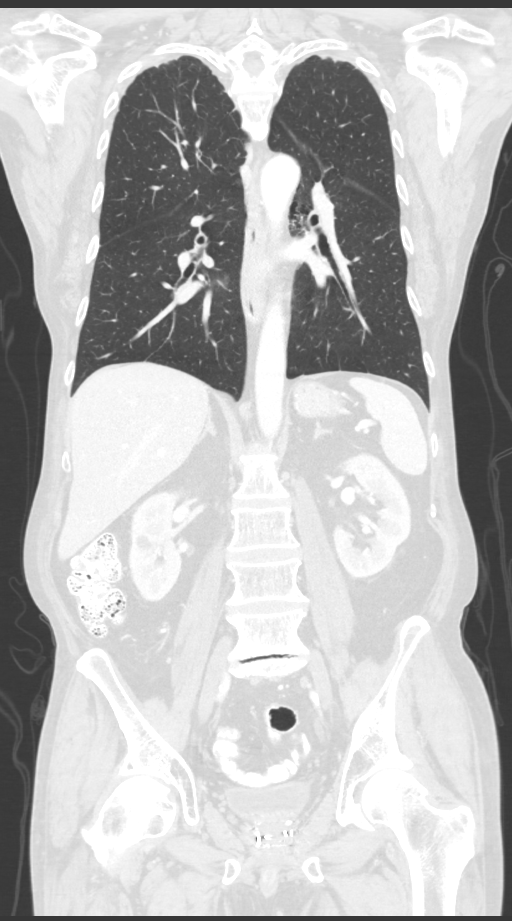

[12 of 36 positions shown; findings below may reference images not displayed]

Imaging Indication: Assess response to therapy

Interval therapy since last imaging? Yes

Initial Cancer Diagnosis

Date: 04/12/2020; Established by: Biopsy-proven

Detailed Pathology: Extensive stage small cell lung cancer.

Primary Tumor location: Left hilum and left upper lobe. Solitary
brain metastasis.

Surgeries: No

Chemotherapy: Yes; Ongoing? Yes; Most recent administration:
06/24/2020

Immunotherapy?  Yes; Type: Imfinzi; Ongoing? Yes

Radiation therapy? Yes; Date Range: [DATE]; Target:
Left lung

Other Cancers: Prostate cancer with brachytherapy seeds [id number].

EXAM:
CT CHEST, ABDOMEN, AND PELVIS WITH CONTRAST
FINDINGS: CT CHEST FINDINGS

Cardiovascular: The heart is normal in size. No pericardial
effusion.

No evidence of thoracic aortic aneurysm. Mild atherosclerotic
calcifications of the aortic arch.

Coronary atherosclerosis of the LAD and right coronary artery.

Right chest port terminates at the cavoatrial junction.

Mediastinum/Nodes: No suspicious mediastinal lymphadenopathy.

Near complete resolution of abnormal soft tissue in the AP
window/left suprahilar region (series 2/image 28).

No suspicious axillary lymphadenopathy.

Visualized thyroid is unremarkable.

Lungs/Pleura: 1.7 x 2.9 cm left upper lobe nodule (series 6/image
41), previously reflecting a 2.3 x 3.5 cm mixed density mass,
corresponding to the patient's known primary bronchogenic neoplasm.
Mass abuts/involves the lateral chest wall (series 6/image 38).
Associated mild adjacent satellite nodularity (series 6/image 32).

Faint linear scarring in the medial left upper lobe (series 6/image
80), at the site of prior nodular opacity, improved.

Biapical pleural-parenchymal scarring.

Mild centrilobular and paraseptal emphysematous changes, upper lung
predominant.

No focal consolidation.

No pleural effusion or pneumothorax.

Musculoskeletal: Degenerative changes of the lower thoracic spine.

CT ABDOMEN PELVIS FINDINGS

Hepatobiliary: Liver is within normal limits.

Gallbladder is unremarkable. No intrahepatic or extrahepatic ductal
dilatation.

Pancreas: Within normal limits.

Spleen: Within normal limits.

Adrenals/Urinary Tract: Adrenal glands are within normal limits.

3 mm nonobstructing interpolar left renal calculus (series 2/image
75). 15 mm right lower pole renal cyst (series 2/image 85). No
hydronephrosis.

Bladder is mildly thick-walled although underdistended.

Stomach/Bowel: Stomach is within normal limits.

No evidence of bowel obstruction.

Normal appendix (series 2/image 112).

Vascular/Lymphatic: No evidence of abdominal aortic aneurysm.

Atherosclerotic calcifications of the abdominal aorta and branch
vessels.

No suspicious abdominopelvic lymphadenopathy.

Reproductive: Brachytherapy seeds in the prostate.

Other: No abdominopelvic ascites.

Musculoskeletal: Degenerative changes of the lower lumbar spine.
IMPRESSION: 2.9 cm left upper lobe nodule, decreased, corresponding to the
patient's known primary bronchogenic neoplasm.

Prior medial left upper lobe nodule has improved.

Prior left suprahilar/AP window nodal mass can no longer be
discretely measured.

No evidence of metastatic disease in the abdomen/pelvis.

Brachytherapy seeds in the prostate.

## 2021-06-16 ENCOUNTER — Other Ambulatory Visit: Payer: Self-pay

## 2021-06-16 ENCOUNTER — Other Ambulatory Visit: Payer: Self-pay | Admitting: *Deleted

## 2021-06-16 ENCOUNTER — Ambulatory Visit (INDEPENDENT_AMBULATORY_CARE_PROVIDER_SITE_OTHER): Payer: 59 | Admitting: Internal Medicine

## 2021-06-16 VITALS — BP 116/68 | HR 100 | Temp 98.2°F | Ht 72.0 in | Wt 147.0 lb

## 2021-06-16 DIAGNOSIS — E871 Hypo-osmolality and hyponatremia: Secondary | ICD-10-CM | POA: Diagnosis not present

## 2021-06-16 DIAGNOSIS — I10 Essential (primary) hypertension: Secondary | ICD-10-CM | POA: Diagnosis not present

## 2021-06-16 DIAGNOSIS — R7302 Impaired glucose tolerance (oral): Secondary | ICD-10-CM

## 2021-06-16 NOTE — Patient Instructions (Signed)
Please continue all other medications as before, and refills have been done if requested.  Please have the pharmacy call with any other refills you may need.  Please continue your efforts at being more active, low cholesterol diet, and weight control..  Please keep your appointments with your specialists as you may have planned  Please make an Appointment to return in 3 months, or sooner if needed

## 2021-06-16 NOTE — Progress Notes (Signed)
Chief Complaint: follow up post hospn - TCM       HPI:  Richard Santos is a 60 y.o. male here with c/o recent hospn July 12-14 at [facility] with low sodium  Admitted to stepdown status.  Nephrology consulted with recommendations for hypertonic saline.  Has been on 3% saline since admission at a rate of 25 cc/h.  Sodium level slowly improving.  Per nephrology recommendations we will continue 3% saline until sodium level greater than 125 at that time transition to fluid restriction and salt tablets,  given one dose tolvaptan. Not given demeclocycline at d/c.finished salt tab yesterday.  Has ongoing nausea with first bite of food but has learned if he then waits for a new minutes, he can finish the meal.  Also has had alternating constipation diarrhea but no fever, blood.  Does c/o ongoing fatigue, but denies signficant daytime hypersomnolence.  Declines lab today as has scheduled lab work tomorrow.  No other new complaints  Still smoking, not ready to quit.         Wt Readings from Last 3 Encounters:  06/18/21 147 lb 7.8 oz (66.9 kg)  06/16/21 147 lb (66.7 kg)  06/03/21 154 lb 4.8 oz (70 kg)   BP Readings from Last 3 Encounters:  06/20/21 115/77  06/16/21 116/68  06/05/21 104/74  Transitional Care Management elements noted today: 1)  Date of D/C: as above 2)  Medication reconciliation:  done today at end visit 3)  Review of D/C summary or other information:  done today 4)  Review of need for f/u on pending diagnostic tests and treatments:  done today 5)  Review of need for Interaction with other providers who will assume or resume care of pt specific problems: none needed 6)  Education of patient/family/guardian or caregiver: none needed        Past Medical History:  Diagnosis Date   Allergic rhinitis    Allergy    Anxiety    Chronic low back pain    DDD (degenerative disc disease), lumbar    ED (erectile dysfunction)    Finger injury    cut pads off 3rd and 4th finger left  hand/due to lawn mower accident   GERD (gastroesophageal reflux disease)    Heart murmur    as child only-    History of kidney stones    HTN (hypertension) 08/20/2016   Hyperlipidemia    Incomplete right bundle branch block    Prostate cancer ([place]) dx 10/02/14   stage T1c   Sigmoid diverticulosis    Small cell lung cancer ([place]) 03/2020   Wears glasses    Past Surgical History:  Procedure Laterality Date   BRONCHIAL BIOPSY  04/12/2020   Procedure: BRONCHIAL BIOPSIES;  Surgeon: Marshell Garfinkel, MD;  Location: [place];  Service: Cardiopulmonary;;   BRONCHIAL BRUSHINGS  04/12/2020   Procedure: BRONCHIAL BRUSHINGS;  Surgeon: Marshell Garfinkel, MD;  Location: [facility];  Service: Cardiopulmonary;;   BRONCHIAL NEEDLE ASPIRATION BIOPSY  04/12/2020   Procedure: BRONCHIAL NEEDLE ASPIRATION BIOPSIES;  Surgeon: Marshell Garfinkel, MD;  Location: [place];  Service: Cardiopulmonary;;   BRONCHIAL WASHINGS  04/12/2020   Procedure: BRONCHIAL WASHINGS;  Surgeon: Marshell Garfinkel, MD;  Location: [place];  Service: Cardiopulmonary;;   COLONOSCOPY     COLONOSCOPY W/ POLYPECTOMY  04-19-2012   ENDOBRONCHIAL ULTRASOUND N/A 04/12/2020   Procedure: ENDOBRONCHIAL ULTRASOUND;  Surgeon: Marshell Garfinkel, MD;  Location: [facility];  Service: Cardiopulmonary;  Laterality: N/A;   ESOPHAGOGASTRODUODENOSCOPY  08-05-2010   HEMOSTASIS CONTROL  04/12/2020   Procedure: HEMOSTASIS CONTROL;  Surgeon: Marshell Garfinkel, MD;  Location: [place];  Service: Cardiopulmonary;;   IR IMAGING GUIDED PORT INSERTION  05/31/2020   POLYPECTOMY     PROSTATE BIOPSY  10/02/14   RADIOACTIVE SEED IMPLANT N/A 01/30/2015   Procedure: RADIOACTIVE SEED IMPLANT;  Surgeon: Rana Snare, MD;  Location: [facility];  Service: Urology;  Laterality: N/A;   UPPER GASTROINTESTINAL ENDOSCOPY  04/03/2021   VIDEO BRONCHOSCOPY N/A 04/12/2020   Procedure: VIDEO BRONCHOSCOPY WITHOUT FLUORO;  Surgeon: Marshell Garfinkel, MD;  Location: [place];  Service: Cardiopulmonary;  Laterality: N/A;    reports that he has been smoking cigarettes. He has a 12.50 pack-year smoking history. He has never used smokeless tobacco. He reports current alcohol use of about 49.0 standard drinks of alcohol per week. He reports that he does not use drugs. family history includes Breast cancer in his sister; Heart disease in his father, mother, and another family member; Prostate cancer in his father. Allergies  Allergen Reactions   Bee Venom Swelling   Elemental Sulfur Other (See Comments)    Acute renal failure   Other Other (See Comments)    Chemo medicine- patient not sure about name - swelling & rashes   Sulfa Antibiotics    Namenda [Memantine] Nausea Only    Nausea and fatigue   Current Outpatient Medications on File Prior to Visit  Medication Sig Dispense Refill   acetaminophen (TYLENOL) 325 MG tablet Take 2 tablets (650 mg total) by mouth every 6 (six) hours as needed for mild pain (or Fever >/= 101). 30 tablet 0   aspirin EC 81 MG tablet Take 81 mg by mouth daily. Swallow whole.     BREZTRI AEROSPHERE 160-9-4.8 MCG/ACT AERO INHALE 2 PUFFS INTO THE LUNGS IN THE MORNING AND AT BEDTIME. 10.7 g 10   famotidine (PEPCID) 20 MG tablet Take 1 tablet (20 mg total) by mouth 2 (two) times daily. 60 tablet 2   Multiple Vitamin (MULTIVITAMIN) capsule Take 1 capsule by mouth daily.     ondansetron (ZOFRAN-ODT) 8 MG disintegrating tablet Take 1 tablet (8 mg total) by mouth every 8 (eight) hours as needed for nausea or vomiting. 30 tablet 2   prochlorperazine (COMPAZINE) 10 MG tablet Take 1 tablet (10 mg total) by mouth every 6 (six) hours as needed. 30 tablet 2   traMADol (ULTRAM) 50 MG tablet TAKE 1/2 TABLET BY MOUTH TWICE A DAY AS NEEDED FOR PAIN 60 tablet 2   No current facility-administered medications on file prior to visit.        ROS:  All others reviewed and negative.  Objective        PE:  BP 116/68   Pulse 100   Temp 98.2 F (36.8  C) (Oral)   Ht 6' (1.829 m)   Wt 147 lb (66.7 kg)   SpO2 96%   BMI 19.94 kg/m                 Constitutional: Pt appears in NAD               HENT: Head: NCAT.                Right Ear: External ear normal.                 Left Ear: External ear normal.  Eyes: . Pupils are equal, round, and reactive to light. Conjunctivae and EOM are normal               Nose: without d/c or deformity               Neck: Neck supple. Gross normal ROM               Cardiovascular: Normal rate and regular rhythm.                 Pulmonary/Chest: Effort normal and breath sounds without rales or wheezing.                Abd:  Soft, NT, ND, + BS, no organomegaly               Neurological: Pt is alert. At baseline orientation, motor grossly intact               Skin: Skin is warm. No rashes, no other new lesions, LE edema - none               Psychiatric: Pt behavior is normal without agitation   Micro: none  Cardiac tracings I have personally interpreted today:  none  Pertinent Radiological findings (summarize): none   Lab Results  Component Value Date   WBC 3.5 (L) 06/20/2021   HGB 10.8 (L) 06/20/2021   HCT 29.7 (L) 06/20/2021   PLT 267 06/20/2021   GLUCOSE 99 06/20/2021   CHOL 123 05/20/2021   TRIG 47 05/20/2021   HDL 53 05/20/2021   LDLDIRECT 120.0 12/12/2019   LDLCALC 61 05/20/2021   ALT 22 06/19/2021   AST 31 06/19/2021   NA 134 (L) 06/20/2021   K 3.8 06/20/2021   CL 101 06/20/2021   CREATININE 0.50 (L) 06/20/2021   BUN <5 (L) 06/20/2021   CO2 26 06/20/2021   TSH 1.083 06/17/2021   PSA 0.27 08/10/2018   INR 1.0 04/11/2020   HGBA1C 5.4 12/12/2019   Assessment/Plan:  Richard Santos is a 60 y.o. White or Caucasian [1] male with  has a past medical history of Allergic rhinitis, Allergy, Anxiety, Chronic low back pain, DDD (degenerative disc disease), lumbar, ED (erectile dysfunction), Finger injury, GERD (gastroesophageal reflux disease), Heart murmur, History of  kidney stones, HTN (hypertension) (08/20/2016), Hyperlipidemia, Incomplete right bundle branch block, Prostate cancer ([place]) (dx 10/02/14), Sigmoid diverticulosis, Small cell lung cancer ([place]) (03/2020), and Wears glasses.  Impaired glucose tolerance Lab Results  Component Value Date   HGBA1C 5.4 12/12/2019   Stable, pt to continue current medical treatment   - diet   Hyponatremia Suspect may be worsening again, delcines lab today as is scheduled for tomorrow,  to f/u any worsening symptoms or concerns  HTN (hypertension) BP Readings from Last 3 Encounters:  06/20/21 115/77  06/16/21 116/68  06/05/21 104/74   Stable, pt to continue medical treatment  - lasix  Followup: Return in about 3 months (around 09/16/2021).  Cathlean Cower, MD 06/21/2021 9:52 PM [facility]

## 2021-06-17 ENCOUNTER — Encounter: Payer: Self-pay | Admitting: Internal Medicine

## 2021-06-17 ENCOUNTER — Other Ambulatory Visit: Payer: 59

## 2021-06-17 ENCOUNTER — Inpatient Hospital Stay
Admission: EM | Admit: 2021-06-17 | Discharge: 2021-06-20 | DRG: 180 | Disposition: A | Discharge status: Home / Self Care | Payer: 59 | Attending: Internal Medicine | Admitting: Internal Medicine

## 2021-06-17 ENCOUNTER — Telehealth: Payer: Self-pay

## 2021-06-17 ENCOUNTER — Other Ambulatory Visit: Payer: Self-pay

## 2021-06-17 ENCOUNTER — Ambulatory Visit: Payer: 59 | Admitting: Internal Medicine

## 2021-06-17 ENCOUNTER — Inpatient Hospital Stay: Payer: 59

## 2021-06-17 ENCOUNTER — Ambulatory Visit: Payer: 59

## 2021-06-17 DIAGNOSIS — Z7982 Long term (current) use of aspirin: Secondary | ICD-10-CM

## 2021-06-17 DIAGNOSIS — I10 Essential (primary) hypertension: Secondary | ICD-10-CM | POA: Diagnosis present

## 2021-06-17 DIAGNOSIS — Z79891 Long term (current) use of opiate analgesic: Secondary | ICD-10-CM

## 2021-06-17 DIAGNOSIS — C7970 Secondary malignant neoplasm of unspecified adrenal gland: Secondary | ICD-10-CM | POA: Diagnosis present

## 2021-06-17 DIAGNOSIS — E785 Hyperlipidemia, unspecified: Secondary | ICD-10-CM | POA: Diagnosis present

## 2021-06-17 DIAGNOSIS — K219 Gastro-esophageal reflux disease without esophagitis: Secondary | ICD-10-CM | POA: Diagnosis present

## 2021-06-17 DIAGNOSIS — E43 Unspecified severe protein-calorie malnutrition: Secondary | ICD-10-CM | POA: Diagnosis present

## 2021-06-17 DIAGNOSIS — Z8546 Personal history of malignant neoplasm of prostate: Secondary | ICD-10-CM

## 2021-06-17 DIAGNOSIS — J449 Chronic obstructive pulmonary disease, unspecified: Secondary | ICD-10-CM | POA: Diagnosis present

## 2021-06-17 DIAGNOSIS — F17210 Nicotine dependence, cigarettes, uncomplicated: Secondary | ICD-10-CM | POA: Diagnosis present

## 2021-06-17 DIAGNOSIS — J309 Allergic rhinitis, unspecified: Secondary | ICD-10-CM | POA: Diagnosis present

## 2021-06-17 DIAGNOSIS — C7931 Secondary malignant neoplasm of brain: Secondary | ICD-10-CM | POA: Diagnosis present

## 2021-06-17 DIAGNOSIS — Z882 Allergy status to sulfonamides status: Secondary | ICD-10-CM

## 2021-06-17 DIAGNOSIS — Z8042 Family history of malignant neoplasm of prostate: Secondary | ICD-10-CM

## 2021-06-17 DIAGNOSIS — R531 Weakness: Secondary | ICD-10-CM

## 2021-06-17 DIAGNOSIS — E222 Syndrome of inappropriate secretion of antidiuretic hormone: Secondary | ICD-10-CM | POA: Diagnosis present

## 2021-06-17 DIAGNOSIS — E871 Hypo-osmolality and hyponatremia: Secondary | ICD-10-CM | POA: Diagnosis not present

## 2021-06-17 DIAGNOSIS — Z95828 Presence of other vascular implants and grafts: Secondary | ICD-10-CM

## 2021-06-17 DIAGNOSIS — Z6820 Body mass index (BMI) 20.0-20.9, adult: Secondary | ICD-10-CM | POA: Diagnosis not present

## 2021-06-17 DIAGNOSIS — Z888 Allergy status to other drugs, medicaments and biological substances status: Secondary | ICD-10-CM | POA: Diagnosis not present

## 2021-06-17 DIAGNOSIS — Z20822 Contact with and (suspected) exposure to covid-19: Secondary | ICD-10-CM | POA: Diagnosis present

## 2021-06-17 DIAGNOSIS — F419 Anxiety disorder, unspecified: Secondary | ICD-10-CM | POA: Diagnosis present

## 2021-06-17 DIAGNOSIS — C3490 Malignant neoplasm of unspecified part of unspecified bronchus or lung: Secondary | ICD-10-CM | POA: Diagnosis not present

## 2021-06-17 DIAGNOSIS — Z7951 Long term (current) use of inhaled steroids: Secondary | ICD-10-CM | POA: Diagnosis not present

## 2021-06-17 DIAGNOSIS — Z91030 Bee allergy status: Secondary | ICD-10-CM

## 2021-06-17 DIAGNOSIS — C3412 Malignant neoplasm of upper lobe, left bronchus or lung: Secondary | ICD-10-CM

## 2021-06-17 DIAGNOSIS — Z5112 Encounter for antineoplastic immunotherapy: Secondary | ICD-10-CM | POA: Diagnosis not present

## 2021-06-17 DIAGNOSIS — Z79899 Other long term (current) drug therapy: Secondary | ICD-10-CM | POA: Diagnosis not present

## 2021-06-17 DIAGNOSIS — D649 Anemia, unspecified: Secondary | ICD-10-CM | POA: Diagnosis present

## 2021-06-17 DIAGNOSIS — Z9221 Personal history of antineoplastic chemotherapy: Secondary | ICD-10-CM | POA: Diagnosis not present

## 2021-06-17 LAB — CBC WITH DIFFERENTIAL (CANCER CENTER ONLY)
Abs Immature Granulocytes: 0.04 10*3/uL (ref 0.00–0.07)
Basophils Absolute: 0 10*3/uL (ref 0.0–0.1)
Basophils Relative: 1 %
Eosinophils Absolute: 0.4 10*3/uL (ref 0.0–0.5)
Eosinophils Relative: 6 %
HCT: 30.4 % — ABNORMAL LOW (ref 39.0–52.0)
Hemoglobin: 11.4 g/dL — ABNORMAL LOW (ref 13.0–17.0)
Immature Granulocytes: 1 %
Lymphocytes Relative: 3 %
Lymphs Abs: 0.2 10*3/uL — ABNORMAL LOW (ref 0.7–4.0)
MCH: 34.9 pg — ABNORMAL HIGH (ref 26.0–34.0)
MCHC: 37.5 g/dL — ABNORMAL HIGH (ref 30.0–36.0)
MCV: 93 fL (ref 80.0–100.0)
Monocytes Absolute: 0.6 10*3/uL (ref 0.1–1.0)
Monocytes Relative: 10 %
Neutro Abs: 4.9 10*3/uL (ref 1.7–7.7)
Neutrophils Relative %: 79 %
Platelet Count: 251 10*3/uL (ref 150–400)
RBC: 3.27 MIL/uL — ABNORMAL LOW (ref 4.22–5.81)
RDW: 12.6 % (ref 11.5–15.5)
WBC Count: 6.1 10*3/uL (ref 4.0–10.5)
nRBC: 0 % (ref 0.0–0.2)

## 2021-06-17 LAB — OSMOLALITY, URINE: Osmolality, Ur: 636 mOsm/kg (ref 300–900)

## 2021-06-17 LAB — CBC
Hemoglobin: 10.5 g/dL — ABNORMAL LOW (ref 13.0–17.0)
Platelets: 226 10*3/uL (ref 150–400)
WBC: 4.5 10*3/uL (ref 4.0–10.5)
nRBC: 0 % (ref 0.0–0.2)

## 2021-06-17 LAB — CMP (CANCER CENTER ONLY)
ALT: 23 U/L (ref 0–44)
AST: 37 U/L (ref 15–41)
Albumin: 3.2 g/dL — ABNORMAL LOW (ref 3.5–5.0)
Alkaline Phosphatase: 84 U/L (ref 38–126)
Anion gap: 11 (ref 5–15)
BUN: 8 mg/dL (ref 6–20)
CO2: 23 mmol/L (ref 22–32)
Calcium: 9.2 mg/dL (ref 8.9–10.3)
Chloride: 81 mmol/L — ABNORMAL LOW (ref 98–111)
Creatinine: 0.7 mg/dL (ref 0.61–1.24)
GFR, Estimated: >60 mL/min (ref >60)
Glucose, Bld: 93 mg/dL (ref 70–99)
Potassium: 4.1 mmol/L (ref 3.5–5.1)
Sodium: 115 mmol/L — CL (ref 135–145)
Total Bilirubin: 0.5 mg/dL (ref 0.3–1.2)
Total Protein: 7.1 g/dL (ref 6.5–8.1)

## 2021-06-17 LAB — SODIUM, URINE, RANDOM: Sodium, Ur: 140 mmol/L

## 2021-06-17 LAB — BASIC METABOLIC PANEL
Anion gap: 8 (ref 5–15)
BUN: 8 mg/dL (ref 6–20)
CO2: 23 mmol/L (ref 22–32)
Calcium: 8.3 mg/dL — ABNORMAL LOW (ref 8.9–10.3)
Chloride: 83 mmol/L — ABNORMAL LOW (ref 98–111)
Creatinine, Ser: 0.5 mg/dL — ABNORMAL LOW (ref 0.61–1.24)
GFR, Estimated: >60 mL/min (ref >60)
Glucose, Bld: 105 mg/dL — ABNORMAL HIGH (ref 70–99)
Potassium: 3.5 mmol/L (ref 3.5–5.1)
Sodium: 114 mmol/L — CL (ref 135–145)

## 2021-06-17 LAB — SODIUM: Sodium: 115 mmol/L — CL (ref 135–145)

## 2021-06-17 LAB — RESP PANEL BY RT-PCR (FLU A&B, COVID) ARPGX2
Influenza A by PCR: NEGATIVE
Influenza B by PCR: NEGATIVE
SARS Coronavirus 2 by RT PCR: NEGATIVE

## 2021-06-17 LAB — TSH: TSH: 1.083 u[IU]/mL (ref 0.350–4.500)

## 2021-06-17 LAB — GLUCOSE, CAPILLARY: Glucose-Capillary: 93 mg/dL (ref 70–99)

## 2021-06-17 LAB — OSMOLALITY: Osmolality: 235 mOsm/kg — CL (ref 275–295)

## 2021-06-17 MED ORDER — SODIUM CHLORIDE 0.9 % IV BOLUS
1000.0000 mL | Freq: Once | INTRAVENOUS | Status: AC
  Administered 2021-06-17: 1000 mL via INTRAVENOUS

## 2021-06-17 MED ORDER — ACETAMINOPHEN 325 MG PO TABS: 650.0000 mg | ORAL_TABLET | Freq: Four times a day (QID) | ORAL | Status: DC | PRN

## 2021-06-17 MED ORDER — ONDANSETRON 4 MG PO TBDP: 8.0000 mg | ORAL_TABLET | Freq: Three times a day (TID) | ORAL | Status: DC | PRN

## 2021-06-17 MED ORDER — ALBUTEROL SULFATE (2.5 MG/3ML) 0.083% IN NEBU: 2.5000 mg | INHALATION_SOLUTION | RESPIRATORY_TRACT | Status: DC | PRN

## 2021-06-17 MED ORDER — ENOXAPARIN SODIUM 40 MG/0.4ML IJ SOSY
40.0000 mg | PREFILLED_SYRINGE | INTRAMUSCULAR | Status: DC
  Administered 2021-06-17 – 2021-06-19 (×3): 40 mg via SUBCUTANEOUS
  Filled 2021-06-17 (×3): qty 0.4

## 2021-06-17 MED ORDER — POLYETHYLENE GLYCOL 3350 17 G PO PACK: 17.0000 g | PACK | Freq: Every day | ORAL | Status: DC | PRN

## 2021-06-17 MED ORDER — BUDESON-GLYCOPYRROL-FORMOTEROL 160-9-4.8 MCG/ACT IN AERO
2.0000 | INHALATION_SPRAY | Freq: Two times a day (BID) | RESPIRATORY_TRACT | Status: DC
  Administered 2021-06-17: 2 via RESPIRATORY_TRACT
  Filled 2021-06-17: qty 10.7

## 2021-06-17 MED ORDER — SODIUM CHLORIDE 0.9% FLUSH
10.0000 mL | Freq: Once | INTRAVENOUS | Status: AC
  Administered 2021-06-17: 10 mL
  Filled 2021-06-17: qty 10

## 2021-06-17 MED ORDER — HYDROCODONE-ACETAMINOPHEN 5-325 MG PO TABS
1.0000 | ORAL_TABLET | ORAL | Status: DC | PRN
  Administered 2021-06-17: 1 via ORAL
  Administered 2021-06-18: 2 via ORAL
  Filled 2021-06-17: qty 2
  Filled 2021-06-17: qty 1

## 2021-06-17 MED ORDER — ONDANSETRON HCL 4 MG PO TABS
4.0000 mg | ORAL_TABLET | Freq: Four times a day (QID) | ORAL | Status: DC | PRN
  Administered 2021-06-18 (×2): 4 mg via ORAL
  Filled 2021-06-17 (×2): qty 1

## 2021-06-17 MED ORDER — ADULT MULTIVITAMIN W/MINERALS CH
1.0000 | ORAL_TABLET | Freq: Every day | ORAL | Status: DC
  Administered 2021-06-18 – 2021-06-20 (×3): 1 via ORAL
  Filled 2021-06-17 (×3): qty 1

## 2021-06-17 MED ORDER — PANTOPRAZOLE SODIUM 40 MG PO TBEC
40.0000 mg | DELAYED_RELEASE_TABLET | Freq: Every morning | ORAL | Status: DC
  Administered 2021-06-18 – 2021-06-20 (×3): 40 mg via ORAL
  Filled 2021-06-17 (×3): qty 1

## 2021-06-17 MED ORDER — ONDANSETRON HCL 4 MG/2ML IJ SOLN: 4.0000 mg | Freq: Four times a day (QID) | INTRAMUSCULAR | Status: DC | PRN

## 2021-06-17 MED ORDER — ASPIRIN EC 81 MG PO TBEC
81.0000 mg | DELAYED_RELEASE_TABLET | Freq: Every day | ORAL | Status: DC
  Administered 2021-06-18 – 2021-06-20 (×3): 81 mg via ORAL
  Filled 2021-06-17 (×3): qty 1

## 2021-06-17 MED ORDER — ACETAMINOPHEN 650 MG RE SUPP: 650.0000 mg | Freq: Four times a day (QID) | RECTAL | Status: DC | PRN

## 2021-06-17 MED ORDER — FAMOTIDINE 20 MG PO TABS
20.0000 mg | ORAL_TABLET | Freq: Two times a day (BID) | ORAL | Status: DC
  Administered 2021-06-17 – 2021-06-20 (×6): 20 mg via ORAL
  Filled 2021-06-17 (×6): qty 1

## 2021-06-17 MED ORDER — SODIUM CHLORIDE 0.9 % IV SOLN: INTRAVENOUS | Status: DC

## 2021-06-17 MED ORDER — SODIUM CHLORIDE 0.9 % IV SOLN: Freq: Once | INTRAVENOUS | Status: AC

## 2021-06-17 MED ORDER — SODIUM CHLORIDE 0.9% FLUSH
3.0000 mL | Freq: Two times a day (BID) | INTRAVENOUS | Status: DC
  Administered 2021-06-18 – 2021-06-20 (×4): 3 mL via INTRAVENOUS

## 2021-06-17 MED ORDER — CHLORHEXIDINE GLUCONATE CLOTH 2 % EX PADS
6.0000 | MEDICATED_PAD | Freq: Every day | CUTANEOUS | Status: DC
  Administered 2021-06-18 – 2021-06-20 (×3): 6 via TOPICAL

## 2021-06-17 MED ORDER — PROCHLORPERAZINE MALEATE 10 MG PO TABS
10.0000 mg | ORAL_TABLET | Freq: Four times a day (QID) | ORAL | Status: DC | PRN
  Filled 2021-06-17: qty 1

## 2021-06-17 MED ORDER — HEPARIN SOD (PORK) LOCK FLUSH 100 UNIT/ML IV SOLN
500.0000 [IU] | Freq: Once | INTRAVENOUS | Status: AC
  Administered 2021-06-17: 500 [IU]
  Filled 2021-06-17: qty 5

## 2021-06-17 MED ORDER — SODIUM CHLORIDE 3 % IV SOLN
INTRAVENOUS | Status: DC
  Filled 2021-06-17 (×4): qty 500

## 2021-06-17 NOTE — ED Provider Notes (Signed)
[facility] Emergency Department Provider Note  ____________________________________________   I have reviewed the triage vital signs and the nursing notes.   HISTORY  Chief Complaint Abnormal Lab   History limited by: Not Limited   HPI Richard Santos is a 60 y.o. male who presents to the emergency department today because of concern for low sodium level found on outpatient blood work. The patient has history of lung cancer and is currently receiving treatment. He states he has had problems with hyponatremia since receiving radiation to his adrenal glands. He says he has had to have hospitalizations in the past for his low sodium levels.   Records reviewed. Per medical record review patient has a history of GERD, HTN.  Past Medical History:  Diagnosis Date   Allergic rhinitis    Allergy    Anxiety    Chronic low back pain    DDD (degenerative disc disease), lumbar    ED (erectile dysfunction)    Finger injury    cut pads off 3rd and 4th finger left hand/due to lawn mower accident   GERD (gastroesophageal reflux disease)    Heart murmur    as child only-    History of kidney stones    HTN (hypertension) 08/20/2016   Hyperlipidemia    Incomplete right bundle branch block    Prostate cancer ([place]) dx 10/02/14   stage T1c   Sigmoid diverticulosis    Small cell lung cancer ([place]) 03/2020   Wears glasses     Patient Active Problem List   Diagnosis Date Noted   Protein-calorie malnutrition, severe 06/04/2021   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   Acute hyponatremia 05/21/2021   Hyponatremia 05/20/2021   Metastatic cancer to brain ([place]) 09/05/2020   Port-A-Cath in place 06/03/2020   Emphysema of lung ([place]) 04/26/2020   Small cell lung cancer, left upper lobe ([place]) 04/18/2020   Encounter for antineoplastic chemotherapy 04/18/2020   Encounter for antineoplastic immunotherapy 04/18/2020   Goals of care,  counseling/discussion 04/18/2020   Syncope 04/11/2020   Lung mass 04/11/2020   History of prostate cancer 04/11/2020   Chest pain 04/03/2020   Vitamin D deficiency 12/15/2019   Dysphagia 07/17/2017   HTN (hypertension) 08/20/2016   Malignant neoplasm of prostate ([place]) 11/29/2014   Bee sting allergy 07/31/2014   Elevated PSA 04/14/2013   Lumbar disc disease 04/14/2013   Personal history of colonic adenoma 04/26/2012   Impaired glucose tolerance 02/27/2012   Encounter for well adult exam with abnormal findings 02/27/2012   Chronic low back pain    Alcohol abuse 12/31/2010   Functional Dyspepsia 03/11/2009   Hyperlipidemia 01/16/2008   Anxiety state 01/16/2008   ERECTILE DYSFUNCTION 01/16/2008   Tobacco abuse 01/16/2008   ALLERGIC RHINITIS 01/16/2008   GERD 01/16/2008    Past Surgical History:  Procedure Laterality Date   BRONCHIAL BIOPSY  04/12/2020   Procedure: BRONCHIAL BIOPSIES;  Surgeon: Marshell Garfinkel, MD;  Location: [place];  Service: Cardiopulmonary;;   BRONCHIAL BRUSHINGS  04/12/2020   Procedure: BRONCHIAL BRUSHINGS;  Surgeon: Marshell Garfinkel, MD;  Location: [place];  Service: Cardiopulmonary;;   BRONCHIAL NEEDLE ASPIRATION BIOPSY  04/12/2020   Procedure: BRONCHIAL NEEDLE ASPIRATION BIOPSIES;  Surgeon: Marshell Garfinkel, MD;  Location: [place];  Service: Cardiopulmonary;;   BRONCHIAL WASHINGS  04/12/2020   Procedure: BRONCHIAL WASHINGS;  Surgeon: Marshell Garfinkel, MD;  Location: [place];  Service: Cardiopulmonary;;   COLONOSCOPY     COLONOSCOPY W/ POLYPECTOMY  04-19-2012  ENDOBRONCHIAL ULTRASOUND N/A 04/12/2020   Procedure: ENDOBRONCHIAL ULTRASOUND;  Surgeon: Marshell Garfinkel, MD;  Location: [place];  Service: Cardiopulmonary;  Laterality: N/A;   ESOPHAGOGASTRODUODENOSCOPY  08-05-2010   HEMOSTASIS CONTROL  04/12/2020   Procedure: HEMOSTASIS CONTROL;  Surgeon: Marshell Garfinkel, MD;  Location: [place];  Service: Cardiopulmonary;;   IR IMAGING GUIDED  PORT INSERTION  05/31/2020   POLYPECTOMY     PROSTATE BIOPSY  10/02/14   RADIOACTIVE SEED IMPLANT N/A 01/30/2015   Procedure: RADIOACTIVE SEED IMPLANT;  Surgeon: Rana Snare, MD;  Location[facility];  Service: Urology;  Laterality: N/A;   UPPER GASTROINTESTINAL ENDOSCOPY  04/03/2021   VIDEO BRONCHOSCOPY N/A 04/12/2020   Procedure: VIDEO BRONCHOSCOPY WITHOUT FLUORO;  Surgeon: Marshell Garfinkel, MD;  Location: [place];  Service: Cardiopulmonary;  Laterality: N/A;    Prior to Admission medications   Medication Sig Start Date End Date Taking? Authorizing Provider  acetaminophen (TYLENOL) 325 MG tablet Take 2 tablets (650 mg total) by mouth every 6 (six) hours as needed for mild pain (or Fever >/= 101). 04/12/20   Regalado, Jerald Kief A, MD  aspirin EC 81 MG tablet Take 81 mg by mouth daily. Swallow whole.    [provider]  BREZTRI AEROSPHERE 160-9-4.8 MCG/ACT AERO INHALE 2 PUFFS INTO THE LUNGS IN THE MORNING AND AT BEDTIME. 03/14/21   Mannam, Praveen, MD  famotidine (PEPCID) 20 MG tablet Take 1 tablet (20 mg total) by mouth 2 (two) times daily. 05/09/21   [name], MD  Multiple Vitamin (MULTIVITAMIN) capsule Take 1 capsule by mouth daily.    [provider]  ondansetron (ZOFRAN-ODT) 8 MG disintegrating tablet Take 1 tablet (8 mg total) by mouth every 8 (eight) hours as needed for nausea or vomiting. 03/14/21   Heilingoetter, Cassandra L, PA-C  prochlorperazine (COMPAZINE) 10 MG tablet Take 1 tablet (10 mg total) by mouth every 6 (six) hours as needed. 11/25/20   Heilingoetter, Cassandra L, PA-C  traMADol (ULTRAM) 50 MG tablet TAKE 1/2 TABLET BY MOUTH TWICE A DAY AS NEEDED FOR PAIN 05/19/21   [name], MD    Allergies Bee venom, Elemental sulfur, Other, Sulfa antibiotics, and Namenda [memantine]  Family History  Problem Relation Age of Onset   Prostate cancer Father    Heart disease Father    Heart disease Mother    Breast cancer Sister    Heart disease  Other    Colon cancer Neg Hx    Colon polyps Neg Hx    Esophageal cancer Neg Hx    Rectal cancer Neg Hx    Stomach cancer Neg Hx     Social History Social History   Tobacco Use   Smoking status: Every Day    Packs/day: 0.50    Years: 25.00    Pack years: 12.50    Types: Cigarettes   Smokeless tobacco: Never   Tobacco comments:    smokes 1/2 pack per day 02/11/2021  Vaping Use   Vaping Use: Former  Substance Use Topics   Alcohol use: Yes    Alcohol/week: 49.0 standard drinks    Types: 25 Cans of beer, 24 Standard drinks or equivalent per week    Comment: 5 beer per day   Drug use: No    Review of Systems Constitutional: No fever/chills Eyes: No visual changes. ENT: No sore throat. Cardiovascular: Denies chest pain. Respiratory: Denies shortness of breath. Gastrointestinal: No abdominal pain.  No nausea, no vomiting.  No diarrhea.   Genitourinary: Negative for  dysuria. Musculoskeletal: Negative for back pain. Skin: Negative for rash. Neurological: Negative for headaches, focal weakness or numbness.  ____________________________________________   PHYSICAL EXAM:  VITAL SIGNS: ED Triage Vitals  Enc Vitals Group     BP 06/17/21 1339 96/75     Pulse Rate 06/17/21 1338 96     Resp 06/17/21 1338 16     Temp 06/17/21 1338 97.7 F (36.5 C)     Temp Source 06/17/21 1338 Oral     SpO2 06/17/21 1338 99 %     Weight 06/17/21 1339 148 lb (67.1 kg)     Height 06/17/21 1339 6' (1.829 m)     Head Circumference --      Peak Flow --      Pain Score 06/17/21 1339 3   Constitutional: Alert and oriented.  Eyes: Conjunctivae are normal.  ENT      Head: Normocephalic and atraumatic.      Nose: No congestion/rhinnorhea.      Mouth/Throat: Mucous membranes are moist.      Neck: No stridor. Hematological/Lymphatic/Immunilogical: No cervical lymphadenopathy. Cardiovascular: Normal rate, regular rhythm.  No murmurs, rubs, or gallops.  Respiratory: Normal respiratory effort  without tachypnea nor retractions. Breath sounds are clear and equal bilaterally. No wheezes/rales/rhonchi. Gastrointestinal: Soft and non tender. No rebound. No guarding.  Genitourinary: Deferred Musculoskeletal: Normal range of motion in all extremities. No lower extremity edema. Neurologic:  Normal speech and language. No gross focal neurologic deficits are appreciated.  Skin:  Skin is warm, dry and intact. No rash noted. Psychiatric: Mood and affect are normal. Speech and behavior are normal. Patient exhibits appropriate insight and judgment.  ____________________________________________    LABS (pertinent positives/negatives)  Labs from earlier today reviewed  ____________________________________________   EKG  I, Nance Pear, attending physician, personally viewed and interpreted this EKG  EKG Time: 1344 Rate: 90 Rhythm: normal sinus rhythm Axis: left axis deviation Intervals: qtc 452 QRS: incomplete RBBB ST changes: no st elevation Impression: abnormal ekg  ____________________________________________    RADIOLOGY  None  ____________________________________________   PROCEDURES  Procedures  ____________________________________________   INITIAL IMPRESSION / ASSESSMENT AND PLAN / ED COURSE  Pertinent labs & imaging results that were available during my care of the patient were reviewed by me and considered in my medical decision making (see chart for details).   Patient presented to the emergency department today because of concern for low sodium from blood work drawn today. Patient states he is a little fatigued. No confusion here. Will start NaCl. Will admit to hospitalist service.   ____________________________________________   FINAL CLINICAL IMPRESSION(S) / ED DIAGNOSES  Final diagnoses:  Hyponatremia  Weakness     Note: This dictation was prepared with Dragon dictation. Any transcriptional errors that result from this process are  unintentional     Nance Pear, MD 06/17/21 1756

## 2021-06-17 NOTE — ED Triage Notes (Signed)
Pt sent from the cancer center with low Na+115 today, pt is currently getting therapy for stage 4 lung CA. Pt is alert on arrival, slow to respond with slurred speech.

## 2021-06-17 NOTE — H&P (Signed)
History and Physical    Richard Santos XBJ:[id number] DOB: [birth year] DOA: 06/17/2021  PCP: [name], MD  Patient coming from: home/oncology office  I have personally briefly reviewed patient's old medical records in [place]  Chief Complaint: hyponatremia  HPI: Richard Santos is a 60 y.o. male with medical history significant of  60 y.o. 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 Patient during that hospitalization was treated with  3% saline since admission at a rate of 25 cc/h with goal of NA greater than 125. Patient was then transitioned to fluid restriction and salt tablets and was given one dose of tolvaptan . In addition his  Demeclocycline was held. Patient now return to ed from oncology office after lab draw today noted NA of 115. Patient denies any sz activity, syncope, nausea, confusion ,HA, or weakness but does note increase fatigue from baseline. He denies any excessive fluid intake, etoh use and noted he has been compliant with his medical regimen  ED Course: Afeb, bp 116/68, hr 100, sat 96%  Labs: Wbc:6.1, hgb 11.4 at baseline, Na 115 was 127 (7/14) Ekg: nsr,incomplete rbbb Tx:ns 1000L Covid:negative Review of Systems: As per HPI otherwise 10 point review of systems negative.   Past Medical History:  Diagnosis Date   Allergic rhinitis    Allergy    Anxiety    Chronic low back pain    DDD (degenerative disc disease), lumbar    ED (erectile dysfunction)    Finger injury    cut pads off 3rd and 4th finger left hand/due to lawn mower accident   GERD (gastroesophageal  reflux disease)    Heart murmur    as child only-    History of kidney stones    HTN (hypertension) 08/20/2016   Hyperlipidemia    Incomplete right bundle branch block    Prostate cancer ([place]) dx 10/02/14   stage T1c   Sigmoid diverticulosis    Small cell lung cancer ([place]) 03/2020   Wears glasses     Past Surgical History:  Procedure Laterality Date   BRONCHIAL BIOPSY  04/12/2020   Procedure: BRONCHIAL BIOPSIES;  Surgeon: Marshell Garfinkel, MD;  Location: [facility];  Service: Cardiopulmonary;;   BRONCHIAL BRUSHINGS  04/12/2020   Procedure: BRONCHIAL BRUSHINGS;  Surgeon: Marshell Garfinkel, MD;  [facility];  Service: Cardiopulmonary;;   BRONCHIAL NEEDLE ASPIRATION BIOPSY  04/12/2020   Procedure: BRONCHIAL NEEDLE ASPIRATION BIOPSIES;  Surgeon: Marshell Garfinkel, MD;  Location: [facility];  Service: Cardiopulmonary;;   BRONCHIAL WASHINGS  04/12/2020   Procedure: BRONCHIAL WASHINGS;  Surgeon: Marshell Garfinkel, MD;  Location: [facility];  Service: Cardiopulmonary;;   COLONOSCOPY     COLONOSCOPY W/ POLYPECTOMY  04-19-2012   ENDOBRONCHIAL ULTRASOUND N/A 04/12/2020   Procedure: ENDOBRONCHIAL ULTRASOUND;  Surgeon: Marshell Garfinkel, MD;  [facility];  Service: Cardiopulmonary;  Laterality: N/A;   ESOPHAGOGASTRODUODENOSCOPY  08-05-2010   HEMOSTASIS CONTROL  04/12/2020   Procedure: HEMOSTASIS CONTROL;  Surgeon: Marshell Garfinkel, MD;  Location: [facility];  Service: Cardiopulmonary;;   IR IMAGING GUIDED PORT INSERTION  05/31/2020   POLYPECTOMY     PROSTATE BIOPSY  10/02/14  RADIOACTIVE SEED IMPLANT N/A 01/30/2015   Procedure: RADIOACTIVE SEED IMPLANT;  Surgeon: Rana Snare, MD;  Location[facility];  Service: Urology;  Laterality: N/A;   UPPER GASTROINTESTINAL ENDOSCOPY  04/03/2021   VIDEO BRONCHOSCOPY N/A 04/12/2020   Procedure: VIDEO BRONCHOSCOPY WITHOUT FLUORO;  Surgeon: Marshell Garfinkel, MD;  Location: [facility];  Service: Cardiopulmonary;  Laterality: N/A;      reports that he has been smoking cigarettes. He has a 12.50 pack-year smoking history. He has never used smokeless tobacco. He reports current alcohol use of about 49.0 standard drinks of alcohol per week. He reports that he does not use drugs.  Allergies  Allergen Reactions   Bee Venom Swelling   Elemental Sulfur Other (See Comments)    Acute renal failure   Other Other (See Comments)    Chemo medicine- patient not sure about name - swelling & rashes   Sulfa Antibiotics    Namenda [Memantine] Nausea Only    Nausea and fatigue    Family History  Problem Relation Age of Onset   Prostate cancer Father    Heart disease Father    Heart disease Mother    Breast cancer Sister    Heart disease Other    Colon cancer Neg Hx    Colon polyps Neg Hx    Esophageal cancer Neg Hx    Rectal cancer Neg Hx    Stomach cancer Neg Hx    Prior to Admission medications   Medication Sig Start Date End Date Taking? Authorizing Provider  acetaminophen (TYLENOL) 325 MG tablet Take 2 tablets (650 mg total) by mouth every 6 (six) hours as needed for mild pain (or Fever >/= 101). 04/12/20  Yes Regalado, Belkys A, MD  aspirin EC 81 MG tablet Take 81 mg by mouth daily. Swallow whole.   Yes [provider]  BREZTRI AEROSPHERE 160-9-4.8 MCG/ACT AERO INHALE 2 PUFFS INTO THE LUNGS IN THE MORNING AND AT BEDTIME. 03/14/21  Yes Mannam, Praveen, MD  famotidine (PEPCID) 20 MG tablet Take 1 tablet (20 mg total) by mouth 2 (two) times daily. 05/09/21  Yes [name], MD  Multiple Vitamin (MULTIVITAMIN) capsule Take 1 capsule by mouth daily.   Yes [provider]  ondansetron (ZOFRAN-ODT) 8 MG disintegrating tablet Take 1 tablet (8 mg total) by mouth every 8 (eight) hours as needed for nausea or vomiting. 03/14/21  Yes Heilingoetter, Cassandra L, PA-C  pantoprazole (PROTONIX) 40 MG tablet Take 40 mg by mouth every morning. 06/08/21  Yes [provider]  prochlorperazine (COMPAZINE) 10 MG  tablet Take 1 tablet (10 mg total) by mouth every 6 (six) hours as needed. 11/25/20  Yes Heilingoetter, Cassandra L, PA-C  traMADol (ULTRAM) 50 MG tablet TAKE 1/2 TABLET BY MOUTH TWICE A DAY AS NEEDED FOR PAIN 05/19/21  Yes [name], MD    Physical Exam: Vitals:   06/17/21 1639 06/17/21 1700 06/17/21 1730 06/17/21 1800  BP: 118/77 118/73 115/80 116/82  Pulse: 79 78 77 73  Resp: 19 18 16 20   Temp:      TempSrc:      SpO2: 100% 99% 98% 99%  Weight:      Height:         Vitals:   06/17/21 1639 06/17/21 1700 06/17/21 1730 06/17/21 1800  BP: 118/77 118/73 115/80 116/82  Pulse: 79 78 77 73  Resp: 19 18 16 20   Temp:      TempSrc:      SpO2: 100% 99%  98% 99%  Weight:      Height:      Constitutional: NAD, calm, comfortable Eyes: PERRL, lids and conjunctivae normal ENMT: Mucous membranes are moist. Posterior pharynx clear of any exudate or lesions.Normal dentition.  Neck: normal, supple, no masses, no thyromegaly Respiratory: clear to auscultation bilaterally, no wheezing, no crackles. Normal respiratory effort. No accessory muscle use.  Cardiovascular: Regular rate and rhythm, no murmurs / rubs / gallops. No extremity edema. 2+ pedal pulses. No carotid bruits.  Abdomen: no tenderness, no masses palpated. No hepatosplenomegaly. Bowel sounds positive.  Musculoskeletal: no clubbing / cyanosis. No joint deformity upper and lower extremities. Good ROM, no contractures. Normal muscle tone.  Skin: no rashes, lesions, ulcers. No induration Neurologic: CN 2-12 grossly intact. Sensation intact, DTR normal. Strength 5/5 in all 4.  Psychiatric: Normal judgment and insight. Alert and oriented x 3. Normal mood.    Labs on Admission: I have personally reviewed following labs and imaging studies  CBC: Recent Labs  Lab 06/17/21 1105  WBC 6.1  NEUTROABS 4.9  HGB 11.4*  HCT 30.4*  MCV 93.0  PLT 098   Basic Metabolic Panel: Recent Labs  Lab 06/17/21 1105  NA 115*  K 4.1  CL 81*   CO2 23  GLUCOSE 93  BUN 8  CREATININE 0.70  CALCIUM 9.2   GFR: Estimated Creatinine Clearance: 94.4 mL/min (by C-G formula based on SCr of 0.7 mg/dL). Liver Function Tests: Recent Labs  Lab 06/17/21 1105  AST 37  ALT 23  ALKPHOS 84  BILITOT 0.5  PROT 7.1  ALBUMIN 3.2*   No results for input(s): LIPASE, AMYLASE in the last 168 hours. No results for input(s): AMMONIA in the last 168 hours. Coagulation Profile: No results for input(s): INR, PROTIME in the last 168 hours. Cardiac Enzymes: No results for input(s): CKTOTAL, CKMB, CKMBINDEX, TROPONINI in the last 168 hours. BNP (last 3 results) No results for input(s): PROBNP in the last 8760 hours. HbA1C: No results for input(s): HGBA1C in the last 72 hours. CBG: No results for input(s): GLUCAP in the last 168 hours. Lipid Profile: No results for input(s): CHOL, HDL, LDLCALC, TRIG, CHOLHDL, LDLDIRECT in the last 72 hours. Thyroid Function Tests: No results for input(s): TSH, T4TOTAL, FREET4, T3FREE, THYROIDAB in the last 72 hours. Anemia Panel: No results for input(s): VITAMINB12, FOLATE, FERRITIN, TIBC, IRON, RETICCTPCT in the last 72 hours. Urine analysis:    Component Value Date/Time   COLORURINE YELLOW (A) 06/03/2021 1423   APPEARANCEUR CLEAR (A) 06/03/2021 1423   LABSPEC 1.011 06/03/2021 1423   PHURINE 6.0 06/03/2021 1423   GLUCOSEU 50 (A) 06/03/2021 [place] 12/12/2019 1506   HGBUR NEGATIVE 06/03/2021 [place] 06/03/2021 [place] 06/03/2021 1423   PROTEINUR NEGATIVE 06/03/2021 1423   UROBILINOGEN 0.2 12/12/2019 1506   NITRITE NEGATIVE 06/03/2021 1423   LEUKOCYTESUR NEGATIVE 06/03/2021 1423    Radiological Exams on Admission: No results found.  EKG: Independently reviewed. See above  Hyponatremia  Assessment/Plan Acute on chronic Hyponatremia in setting of SIADH related to hx of lung CA -admit to SDU -renal consult for initiation of hypertonic saline ,  per prelim recommendations 3% NA at 30cc/hr , order set intiated - check serum osmo -monitor labs  q4hours /per protocol -place on q4h hour  neuro checks    Small cell lung Ca -followed by Oncology   COPD -continue chronic controller medications  -no acute exacerbation   GERD -ppi  HTN -well controlled resume home regimen    DVT prophylaxis: lwmh   Code Status:  FULL Family Communication: N/A Disposition Plan: patient  expected to be admitted greater than 2 midnights   Consults called:  Renal  Admission status:  inpatient    Clance Boll MD [facility]   If 7PM-7AM, please contact night-coverage [email or web address] Password [facility]  06/17/2021, 7:11 PM

## 2021-06-17 NOTE — Consult Note (Signed)
MEDICATION RELATED CONSULT NOTE - FOLLOW UP   Pharmacy Consult for hypertonic saline Indication: hyponatremia  Allergies  Allergen Reactions   Bee Venom Swelling   Elemental Sulfur Other (See Comments)    Acute renal failure   Other Other (See Comments)    Chemo medicine- patient not sure about name - swelling & rashes   Sulfa Antibiotics    Namenda [Memantine] Nausea Only    Nausea and fatigue    Patient Measurements: Height: 6' (182.9 cm) Weight: 67.1 kg (148 lb) IBW/kg (Calculated) : 77.6   Vital Signs: Temp: 97.7 F (36.5 C) (07/26 1338) Temp Source: Oral (07/26 1338) BP: 133/82 (07/26 1900) Pulse Rate: 75 (07/26 1900) Intake/Output from previous day: No intake/output data recorded. Intake/Output from this shift: No intake/output data recorded.  Labs: Recent Labs    06/17/21 1105  WBC 6.1  HGB 11.4*  HCT 30.4*  PLT 251  CREATININE 0.70  ALBUMIN 3.2*  PROT 7.1  AST 37  ALT 23  ALKPHOS 84  BILITOT 0.5   Estimated Creatinine Clearance: 94.4 mL/min (by C-G formula based on SCr of 0.7 mg/dL).   Microbiology: Recent Results (from the past 720 hour(s))  SARS CORONAVIRUS 2 (TAT 6-24 HRS) Nasopharyngeal Nasopharyngeal Swab     Status: None   Collection Time: 05/20/21  4:20 PM   Specimen: Nasopharyngeal Swab  Result Value Ref Range Status   SARS Coronavirus 2 NEGATIVE NEGATIVE Final    Comment: (NOTE) SARS-CoV-2 target nucleic acids are NOT DETECTED.  The SARS-CoV-2 RNA is generally detectable in upper and lower respiratory specimens during the acute phase of infection. Negative results do not preclude SARS-CoV-2 infection, do not rule out co-infections with other pathogens, and should not be used as the sole basis for treatment or other patient management decisions. Negative results must be combined with clinical observations, patient history, and epidemiological information. The expected result is Negative.  Fact Sheet for  Patients: SugarRoll.be  Fact Sheet for Healthcare Providers: https://www.woods-mathews.com/  This test is not yet approved or cleared by the [place] FDA and  has been authorized for detection and/or diagnosis of SARS-CoV-2 by FDA under an Emergency Use Authorization (EUA). This EUA will remain  in effect (meaning this test can be used) for the duration of the COVID-19 declaration under Se ction 564(b)(1) of the Act, 21 U.S.C. section 360bbb-3(b)(1), unless the authorization is terminated or revoked sooner.  Performed at [facility], [place] [address], [place], [place] [address]   Resp Panel by RT-PCR (Flu A&B, Covid) Nasopharyngeal Swab     Status: None   Collection Time: 06/03/21  2:23 PM   Specimen: Nasopharyngeal Swab; Nasopharyngeal(NP) swabs in vial transport medium  Result Value Ref Range Status   SARS Coronavirus 2 by RT PCR NEGATIVE NEGATIVE Final    Comment: (NOTE) SARS-CoV-2 target nucleic acids are NOT DETECTED.  The SARS-CoV-2 RNA is generally detectable in upper respiratory specimens during the acute phase of infection. The lowest concentration of SARS-CoV-2 viral copies this assay can detect is 138 copies/mL. A negative result does not preclude SARS-Cov-2 infection and should not be used as the sole basis for treatment or other patient management decisions. A negative result may occur with  improper specimen collection/handling, submission of specimen other than nasopharyngeal swab, presence of viral mutation(s) within the areas targeted by this assay, and inadequate number of viral copies(<138 copies/mL). A negative result must be combined with clinical observations, patient history, and epidemiological information. The expected result is Negative.  Fact Sheet for Patients:  EntrepreneurPulse.com.au  Fact Sheet for Healthcare Providers:  IncredibleEmployment.be  This test is  no t yet approved or cleared by the [place] FDA and  has been authorized for detection and/or diagnosis of SARS-CoV-2 by FDA under an Emergency Use Authorization (EUA). This EUA will remain  in effect (meaning this test can be used) for the duration of the COVID-19 declaration under Section 564(b)(1) of the Act, 21 U.S.C.section 360bbb-3(b)(1), unless the authorization is terminated  or revoked sooner.       Influenza A by PCR NEGATIVE NEGATIVE Final   Influenza B by PCR NEGATIVE NEGATIVE Final    Comment: (NOTE) The Xpert Xpress SARS-CoV-2/FLU/RSV plus assay is intended as an aid in the diagnosis of influenza from Nasopharyngeal swab specimens and should not be used as a sole basis for treatment. Nasal washings and aspirates are unacceptable for Xpert Xpress SARS-CoV-2/FLU/RSV testing.  Fact Sheet for Patients: EntrepreneurPulse.com.au  Fact Sheet for Healthcare Providers: IncredibleEmployment.be  This test is not yet approved or cleared by the [place] FDA and has been authorized for detection and/or diagnosis of SARS-CoV-2 by FDA under an Emergency Use Authorization (EUA). This EUA will remain in effect (meaning this test can be used) for the duration of the COVID-19 declaration under Section 564(b)(1) of the Act, 21 U.S.C. section 360bbb-3(b)(1), unless the authorization is terminated or revoked.  Performed at [facility], [address] [place], [place] [address]   MRSA Next Gen by PCR, Nasal     Status: None   Collection Time: 06/03/21  5:41 PM   Specimen: Nasal Mucosa; Nasal Swab  Result Value Ref Range Status   MRSA by PCR Next Gen NOT DETECTED NOT DETECTED Final    Comment: (NOTE) The GeneXpert MRSA Assay (FDA approved for NASAL specimens only), is one component of a comprehensive MRSA colonization surveillance program. It is not intended to diagnose MRSA infection nor to guide or monitor treatment for  MRSA infections. Test performance is not FDA approved in patients less than 82 years old. Performed at [facility], [place], [place] [address]   Resp Panel by RT-PCR (Flu A&B, Covid) Nasopharyngeal Swab     Status: None   Collection Time: 06/17/21  3:20 PM   Specimen: Nasopharyngeal Swab; Nasopharyngeal(NP) swabs in vial transport medium  Result Value Ref Range Status   SARS Coronavirus 2 by RT PCR NEGATIVE NEGATIVE Final    Comment: (NOTE) SARS-CoV-2 target nucleic acids are NOT DETECTED.  The SARS-CoV-2 RNA is generally detectable in upper respiratory specimens during the acute phase of infection. The lowest concentration of SARS-CoV-2 viral copies this assay can detect is 138 copies/mL. A negative result does not preclude SARS-Cov-2 infection and should not be used as the sole basis for treatment or other patient management decisions. A negative result may occur with  improper specimen collection/handling, submission of specimen other than nasopharyngeal swab, presence of viral mutation(s) within the areas targeted by this assay, and inadequate number of viral copies(<138 copies/mL). A negative result must be combined with clinical observations, patient history, and epidemiological information. The expected result is Negative.  Fact Sheet for Patients:  EntrepreneurPulse.com.au  Fact Sheet for Healthcare Providers:  IncredibleEmployment.be  This test is no t yet approved or cleared by the [place] FDA and  has been authorized for detection and/or diagnosis of SARS-CoV-2 by FDA under an Emergency Use Authorization (EUA). This EUA will remain  in effect (meaning this test can be  used) for the duration of the COVID-19 declaration under Section 564(b)(1) of the Act, 21 U.S.C.section 360bbb-3(b)(1), unless the authorization is terminated  or revoked sooner.       Influenza A by PCR NEGATIVE NEGATIVE Final    Influenza B by PCR NEGATIVE NEGATIVE Final    Comment: (NOTE) The Xpert Xpress SARS-CoV-2/FLU/RSV plus assay is intended as an aid in the diagnosis of influenza from Nasopharyngeal swab specimens and should not be used as a sole basis for treatment. Nasal washings and aspirates are unacceptable for Xpert Xpress SARS-CoV-2/FLU/RSV testing.  Fact Sheet for Patients: EntrepreneurPulse.com.au  Fact Sheet for Healthcare Providers: IncredibleEmployment.be  This test is not yet approved or cleared by the [place] FDA and has been authorized for detection and/or diagnosis of SARS-CoV-2 by FDA under an Emergency Use Authorization (EUA). This EUA will remain in effect (meaning this test can be used) for the duration of the COVID-19 declaration under Section 564(b)(1) of the Act, 21 U.S.C. section 360bbb-3(b)(1), unless the authorization is terminated or revoked.  Performed at [facility], [address] [place], [place] [address]     Assessment: 60 y.o. 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, history of prostate cancer,GERD, HTN, HLD, and SIADH with history of low NA who was referred to the ED from oncology clinic for a sodium level of 115  Goal of Therapy:  Increase Na by 4-6 mEq/L in 4-6 hours  Plan:  Start 3% sodium chloride at 30 mL/hr Monitor Na every 2 hours for 2 occurrences, then every 4 hours Call RN to stop infusion and notify MD if Na increases by 4 mEq/L or more in the first 2 hours or Na increases by 6 mEq/L or more in the first 4 hours  Darnelle Bos, PharmD 06/17/2021,8:27 PM

## 2021-06-17 NOTE — Progress Notes (Signed)
                                                                                                                                                             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  Intent: Palliative  Radiation Treatment Dates: 05/15/2021 through 06/09/2021 Site Technique Total Dose (Gy) Dose per Fx (Gy) Completed Fx Beam Energies  Adrenal Gland, Left: Kidney_Lt 3D 37.5/37.5 2.5 15/15 15X   Narrative: The patient tolerated radiation therapy relatively well.   Plan: The patient will follow-up with radiation oncology in about a month. He will also follow up with Dr. Julien Nordmann, and will begin his brain surveillance with repeat MRI in about 3 months.  ________________________________________________    Carola Rhine, PAC

## 2021-06-17 NOTE — Telephone Encounter (Signed)
I spoke with pt and advised per Dr. Julien Nordmann, please go to the ER. Pt expressed understanding of this information and states he will go to [facility].

## 2021-06-17 NOTE — ED Notes (Signed)
Pt provided with sandwich tray and HOB raised per request. Pt denies further needs.

## 2021-06-17 NOTE — Progress Notes (Signed)
                                                                                                                                                             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   Plan: The patient will follow-up with radiation oncology in about a month as he continues with plans for his radiation to the left adrenal gland. He will resume MRI surveillance of his brain in about 3 months time.  ________________________________________________    Carola Rhine, PAC

## 2021-06-17 NOTE — ED Notes (Signed)
MD Felizardo Hoffmann notified of Critical Sodium 114

## 2021-06-17 NOTE — Telephone Encounter (Signed)
CRITICAL VALUE STICKER  CRITICAL VALUE: Sodium = 115  RECEIVER (on-site recipient of call): Yetta Glassman, CMA  DATE & TIME NOTIFIED: 06/17/21 at 11:52am  MESSENGER (representative from lab): Rosann Auerbach  MD NOTIFIED: Dr. Julien Nordmann  TIME OF NOTIFICATION: 06/17/21 at 11:55am  RESPONSE: Per Dr. Julien Nordmann, pt needs to go to the ER.

## 2021-06-17 NOTE — ED Notes (Signed)
Port accessed per pt's request.

## 2021-06-17 NOTE — ED Notes (Signed)
Pt ambulatory to BR with steady gait. Denies dizziness, lightheadedness, or CP. No S/S of distress noted.

## 2021-06-17 NOTE — ED Notes (Signed)
MD at bedside assessing pt and updating pt on plan of care.

## 2021-06-18 ENCOUNTER — Telehealth: Payer: Self-pay

## 2021-06-18 DIAGNOSIS — E871 Hypo-osmolality and hyponatremia: Secondary | ICD-10-CM

## 2021-06-18 DIAGNOSIS — C3490 Malignant neoplasm of unspecified part of unspecified bronchus or lung: Secondary | ICD-10-CM

## 2021-06-18 DIAGNOSIS — D649 Anemia, unspecified: Secondary | ICD-10-CM

## 2021-06-18 LAB — BASIC METABOLIC PANEL
Anion gap: 8 (ref 5–15)
Anion gap: 10 (ref 5–15)
Anion gap: 6 (ref 5–15)
Anion gap: 9 (ref 5–15)
BUN: 6 mg/dL (ref 6–20)
BUN: 5 mg/dL — ABNORMAL LOW (ref 6–20)
BUN: 5 mg/dL — ABNORMAL LOW (ref 6–20)
BUN: <5 mg/dL — ABNORMAL LOW (ref 6–20)
CO2: 23 mmol/L (ref 22–32)
CO2: 23 mmol/L (ref 22–32)
CO2: 23 mmol/L (ref 22–32)
CO2: 22 mmol/L (ref 22–32)
Calcium: 8 mg/dL — ABNORMAL LOW (ref 8.9–10.3)
Calcium: 8.4 mg/dL — ABNORMAL LOW (ref 8.9–10.3)
Calcium: 8.5 mg/dL — ABNORMAL LOW (ref 8.9–10.3)
Calcium: 8.6 mg/dL — ABNORMAL LOW (ref 8.9–10.3)
Chloride: 86 mmol/L — ABNORMAL LOW (ref 98–111)
Chloride: 86 mmol/L — ABNORMAL LOW (ref 98–111)
Chloride: 91 mmol/L — ABNORMAL LOW (ref 98–111)
Chloride: 90 mmol/L — ABNORMAL LOW (ref 98–111)
Creatinine, Ser: 0.47 mg/dL — ABNORMAL LOW (ref 0.61–1.24)
Creatinine, Ser: 0.47 mg/dL — ABNORMAL LOW (ref 0.61–1.24)
Creatinine, Ser: 0.56 mg/dL — ABNORMAL LOW (ref 0.61–1.24)
Creatinine, Ser: 0.56 mg/dL — ABNORMAL LOW (ref 0.61–1.24)
GFR, Estimated: >60 mL/min (ref >60)
GFR, Estimated: >60 mL/min (ref >60)
GFR, Estimated: >60 mL/min (ref >60)
GFR, Estimated: >60 mL/min (ref >60)
Glucose, Bld: 87 mg/dL (ref 70–99)
Glucose, Bld: 98 mg/dL (ref 70–99)
Glucose, Bld: 95 mg/dL (ref 70–99)
Glucose, Bld: 142 mg/dL — ABNORMAL HIGH (ref 70–99)
Potassium: 3.5 mmol/L (ref 3.5–5.1)
Potassium: 3.6 mmol/L (ref 3.5–5.1)
Potassium: 3.6 mmol/L (ref 3.5–5.1)
Potassium: 3.3 mmol/L — ABNORMAL LOW (ref 3.5–5.1)
Sodium: 117 mmol/L — CL (ref 135–145)
Sodium: 119 mmol/L — CL (ref 135–145)
Sodium: 120 mmol/L — ABNORMAL LOW (ref 135–145)
Sodium: 121 mmol/L — ABNORMAL LOW (ref 135–145)

## 2021-06-18 LAB — CBC
HCT: 28.9 % — ABNORMAL LOW (ref 39.0–52.0)
Hemoglobin: 10.8 g/dL — ABNORMAL LOW (ref 13.0–17.0)
MCH: 35.2 pg — ABNORMAL HIGH (ref 26.0–34.0)
MCHC: 37.4 g/dL — ABNORMAL HIGH (ref 30.0–36.0)
MCV: 94.1 fL (ref 80.0–100.0)
Platelets: 217 10*3/uL (ref 150–400)
RBC: 3.07 MIL/uL — ABNORMAL LOW (ref 4.22–5.81)
RDW: 12.8 % (ref 11.5–15.5)
WBC: 5.4 10*3/uL (ref 4.0–10.5)
nRBC: 0 % (ref 0.0–0.2)

## 2021-06-18 MED ORDER — LOPERAMIDE HCL 2 MG PO CAPS
2.0000 mg | ORAL_CAPSULE | ORAL | Status: DC | PRN
  Administered 2021-06-18: 2 mg via ORAL
  Filled 2021-06-18: qty 1

## 2021-06-18 MED ORDER — UMECLIDINIUM BROMIDE 62.5 MCG/INH IN AEPB
1.0000 | INHALATION_SPRAY | Freq: Every day | RESPIRATORY_TRACT | Status: DC
  Filled 2021-06-18: qty 7

## 2021-06-18 MED ORDER — BOOST / RESOURCE BREEZE PO LIQD CUSTOM
1.0000 | Freq: Three times a day (TID) | ORAL | Status: DC
  Administered 2021-06-18 – 2021-06-20 (×3): 1 via ORAL

## 2021-06-18 MED ORDER — DRONABINOL 2.5 MG PO CAPS
2.5000 mg | ORAL_CAPSULE | Freq: Two times a day (BID) | ORAL | Status: DC
  Administered 2021-06-18 – 2021-06-20 (×4): 2.5 mg via ORAL
  Filled 2021-06-18 (×4): qty 1

## 2021-06-18 MED ORDER — MOMETASONE FURO-FORMOTEROL FUM 200-5 MCG/ACT IN AERO
2.0000 | INHALATION_SPRAY | Freq: Two times a day (BID) | RESPIRATORY_TRACT | Status: DC
  Filled 2021-06-18: qty 8.8

## 2021-06-18 MED ORDER — BUDESON-GLYCOPYRROL-FORMOTEROL 160-9-4.8 MCG/ACT IN AERO
2.0000 | INHALATION_SPRAY | Freq: Two times a day (BID) | RESPIRATORY_TRACT | Status: DC
  Administered 2021-06-18 – 2021-06-20 (×4): 2 via RESPIRATORY_TRACT

## 2021-06-18 NOTE — Progress Notes (Signed)
[facility]  ROUNDING NOTE   Subjective:   DENY CHEVEZ is a 60 y.o. 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 He was admitted to [facility] for Hyponatremia [E87.1] Weakness [R53.1]   We have been consulted to evaluate and assist with managing severely low sodium levels. Patient is known to this practice from a recent admission for same concern. Patient was instructed at that time to limit his fluid intake. Patient seen and evaluated while returning to bed. Wife at bedside, assisted patient to use restroom. Patient admits to drinking 2-3 20 oz Powerade's a day. States he has a poor appetite, but feels he eats well. Denies nausea and vomiting. Complains of diarrhea when he eats anything. Patient and wife are frustrated with low sodium levels. He is receiving radiation and immunotherapy for cancer. Denies shortness of breath, currently on room air.  Admission sodium level 115   Objective:  Vital signs in last 24 hours:  Temp:  [97.8 F (36.6 C)-98.3 F (36.8 C)] 98 F (36.7 C) (07/27 1553) Pulse Rate:  [70-82] 82 (07/27 1600) Resp:  [11-21] 16 (07/27 1700) BP: (106-150)/(58-87) 120/75 (07/27 1600) SpO2:  [97 %-100 %] 97 % (07/27 1600) Weight:  [66.1 kg-66.9 kg] 66.9 kg (07/27 0500)  Weight change:  Filed Weights   06/17/21 1339 06/17/21 2200 06/18/21 0500  Weight: 67.1 kg 66.1 kg 66.9 kg    Intake/Output: I/O last 3 completed shifts: In: 1949.7 [P.O.:360; I.V.:589.7; IV Piggyback:1000] Out: 200 [Urine:200]   Intake/Output this shift:  No intake/output data recorded.  Physical Exam: General: NAD, resting in bed  Head: Normocephalic, atraumatic. Moist oral mucosal membranes  Eyes: Anicteric  Neck: Supple  Lungs:  Clear to auscultation, normal breathing effort  Heart: Regular rate and rhythm  Abdomen:  Soft, nontender  Extremities:  no peripheral  edema.  Neurologic: Alert, moving all four extremities  Skin: No lesions       Basic Metabolic Panel: Recent Labs  Lab 06/17/21 2013 06/17/21 2252 06/18/21 0212 06/18/21 1030 06/18/21 1745 06/18/21 1956  NA 114* 115* 117* 119* 120* 121*  K 3.5  --  3.5 3.6 3.6 3.3*  CL 83*  --  86* 86* 91* 90*  CO2 23  --  23 23 23 22   GLUCOSE 105*  --  87 98 95 142*  BUN 8  --  6 5* 5* <5*  CREATININE 0.50*  --  0.47* 0.47* 0.56* 0.56*  CALCIUM 8.3*  --  8.0* 8.4* 8.5* 8.6*    Liver Function Tests: Recent Labs  Lab 06/17/21 1105  AST 37  ALT 23  ALKPHOS 84  BILITOT 0.5  PROT 7.1  ALBUMIN 3.2*   No results for input(s): LIPASE, AMYLASE in the last 168 hours. No results for input(s): AMMONIA in the last 168 hours.  CBC: Recent Labs  Lab 06/17/21 1105 06/17/21 2013 06/18/21 0650  WBC 6.1 4.5 5.4  NEUTROABS 4.9  --   --   HGB 11.4* 10.5* 10.8*  HCT 30.4* RESULTS UNAVAILABLE DUE TO INTERFERING SUBSTANCE 28.9*  MCV 93.0 RESULTS UNAVAILABLE DUE TO INTERFERING SUBSTANCE 94.1  PLT 251 226 217    Cardiac Enzymes: No results for input(s): CKTOTAL, CKMB, CKMBINDEX, TROPONINI in the last 168 hours.  BNP: Invalid input(s): POCBNP  CBG: Recent Labs  Lab 06/17/21 2159  GLUCAP 93    Microbiology: Results for orders placed or performed during  the hospital encounter of 06/17/21  Resp Panel by RT-PCR (Flu A&B, Covid) Nasopharyngeal Swab     Status: None   Collection Time: 06/17/21  3:20 PM   Specimen: Nasopharyngeal Swab; Nasopharyngeal(NP) swabs in vial transport medium  Result Value Ref Range Status   SARS Coronavirus 2 by RT PCR NEGATIVE NEGATIVE Final    Comment: (NOTE) SARS-CoV-2 target nucleic acids are NOT DETECTED.  The SARS-CoV-2 RNA is generally detectable in upper respiratory specimens during the acute phase of infection. The lowest concentration of SARS-CoV-2 viral copies this assay can detect is 138 copies/mL. A negative result does not preclude  SARS-Cov-2 infection and should not be used as the sole basis for treatment or other patient management decisions. A negative result may occur with  improper specimen collection/handling, submission of specimen other than nasopharyngeal swab, presence of viral mutation(s) within the areas targeted by this assay, and inadequate number of viral copies(<138 copies/mL). A negative result must be combined with clinical observations, patient history, and epidemiological information. The expected result is Negative.  Fact Sheet for Patients:  EntrepreneurPulse.com.au  Fact Sheet for Healthcare Providers:  IncredibleEmployment.be  This test is no t yet approved or cleared by the [place] FDA and  has been authorized for detection and/or diagnosis of SARS-CoV-2 by FDA under an Emergency Use Authorization (EUA). This EUA will remain  in effect (meaning this test can be used) for the duration of the COVID-19 declaration under Section 564(b)(1) of the Act, 21 U.S.C.section 360bbb-3(b)(1), unless the authorization is terminated  or revoked sooner.       Influenza A by PCR NEGATIVE NEGATIVE Final   Influenza B by PCR NEGATIVE NEGATIVE Final    Comment: (NOTE) The Xpert Xpress SARS-CoV-2/FLU/RSV plus assay is intended as an aid in the diagnosis of influenza from Nasopharyngeal swab specimens and should not be used as a sole basis for treatment. Nasal washings and aspirates are unacceptable for Xpert Xpress SARS-CoV-2/FLU/RSV testing.  Fact Sheet for Patients: EntrepreneurPulse.com.au  Fact Sheet for Healthcare Providers: IncredibleEmployment.be  This test is not yet approved or cleared by the [place] FDA and has been authorized for detection and/or diagnosis of SARS-CoV-2 by FDA under an Emergency Use Authorization (EUA). This EUA will remain in effect (meaning this test can be used) for the duration of  the COVID-19 declaration under Section 564(b)(1) of the Act, 21 U.S.C. section 360bbb-3(b)(1), unless the authorization is terminated or revoked.  Performed at [facility], [address] [place], [place] [address]     Coagulation Studies: No results for input(s): LABPROT, INR in the last 72 hours.  Urinalysis: No results for input(s): COLORURINE, LABSPEC, PHURINE, GLUCOSEU, HGBUR, BILIRUBINUR, KETONESUR, PROTEINUR, UROBILINOGEN, NITRITE, LEUKOCYTESUR in the last 72 hours.  Invalid input(s): APPERANCEUR    Imaging: No results found.   Medications:    sodium chloride (hypertonic) 30 mL/hr at 06/18/21 1545    aspirin EC  81 mg Oral Daily   Budeson-Glycopyrrol-Formoterol  2 puff Inhalation BID   Chlorhexidine Gluconate Cloth  6 each Topical Daily   dronabinol  2.5 mg Oral BID AC   enoxaparin (LOVENOX) injection  40 mg Subcutaneous Q24H   famotidine  20 mg Oral BID   feeding supplement  1 Container Oral TID BM   multivitamin with minerals  1 tablet Oral Daily   pantoprazole  40 mg Oral q morning   sodium chloride flush  3 mL Intravenous Q12H   acetaminophen **OR** acetaminophen, albuterol, HYDROcodone-acetaminophen, loperamide, ondansetron **OR** ondansetron (ZOFRAN)  IV, polyethylene glycol, prochlorperazine  Assessment/ Plan:  Mr. SHAQUEL CHAVOUS is a 60 y.o.  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 He was admitted to [facility] for  Hyponatremia likely caused by SIADH vs adrenal gland dysfunction related to cancer metastasis. Admission level 115 During previous admission, sodium stabilized around 125-127 at discharge with Tolvaptan. Tolvaptan to be given outpatient once discharged. Patient admitted to ICU and placed on IVF 3%. Will continue this for another 24 hours. Current level 119. Educated patient and wife about adrenal gland function and current  dysfunction caused by cancer. Restrict patient to 1259ml daily. Will continue to monitor and transition to Tolvaptan.   2. Hypertension: currently controlled    LOS: 1 [place] 7/27/20229:37 PM

## 2021-06-18 NOTE — Progress Notes (Signed)
Initial Nutrition Assessment  DOCUMENTATION CODES:   Severe malnutrition in context of chronic illness  INTERVENTION:   Boost Breeze po TID, each supplement provides 250 kcal and 9 grams of protein  Magic cup BID with meals, each supplement provides 290 kcal and 9 grams of protein  Vital Cuisine BID, each supplement provides 520kcal and 22g of protein.   MVI po daily   Pt at high refeed risk; recommend monitor potassium, magnesium and phosphorus labs daily until stable  Recommend consideration of appetite stimulant   NUTRITION DIAGNOSIS:   Severe Malnutrition related to cancer and cancer related treatments as evidenced by moderate fat depletion, severe fat depletion, severe muscle depletion, 5 percent weight loss in 1 month.  GOAL:   Patient will meet greater than or equal to 90% of their needs  MONITOR:   PO intake, Supplement acceptance, Labs, Weight trends, Skin, I & O's  REASON FOR ASSESSMENT:   Malnutrition Screening Tool    ASSESSMENT:   60 y.o. 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 Pt is familiar to this RD from his recent previous admit. Pt reports poor appetite and oral for several months pta; pt reports that his appetite is the worst its been now for several months. Pt reports that he just does not have a desire to eat. Pt has been utilizing protein supplements at home. Pt does not like milky supplements but reports that he likes the Boost Breeze ok. Pt reports that he did not eat well for breakfast this morning as he reports that his food arrived cold. Per chart, pt has lost 7lbs(5%) over the past month; this is significant weight loss. RD will add supplements and MVI  to help pt meet his estimated needs. Pt is likely at refeed risk. Would recommend consideration of appetite stimulant.   Medications reviewed and include: aspirin, lovenox, pepcid, protonix  Labs reviewed: Na 119(L), BUN 5(L), creat 0.47(L) Hgb 10.8(L), Hct 28.9(L)  NUTRITION - FOCUSED PHYSICAL EXAM:  Flowsheet Row Most Recent Value  Orbital Region Mild depletion  Upper Arm Region Severe depletion  Thoracic and Lumbar Region Severe depletion  Buccal Region Mild depletion  Temple Region Mild depletion  Clavicle Bone Region Severe depletion  Clavicle and Acromion Bone Region Severe depletion  Scapular Bone Region Severe depletion  Dorsal Hand Severe depletion  Patellar Region Severe depletion  Anterior Thigh Region Severe depletion  Posterior Calf Region Severe depletion  Edema (RD Assessment) None  Hair Reviewed  Eyes Reviewed  Mouth Reviewed  Skin Reviewed  Nails Reviewed   Diet Order:   Diet Order             Diet regular Room service appropriate? Yes; Fluid consistency: Thin; Fluid restriction: 1200 mL Fluid  Diet effective now                  EDUCATION NEEDS:   Education needs have been addressed  Skin:  Skin Assessment: Reviewed RN Assessment  Last BM:  7/22- type 7  Height:   Ht Readings from Last 1 Encounters:  06/17/21 6' (1.829 m)    Weight:   Wt Readings from Last 1 Encounters:  06/18/21 66.9 kg    Ideal Body Weight:  80.9 kg  BMI:  Body mass  index is 20 kg/m.  Estimated Nutritional Needs:   Kcal:  2000-2300kcal/day  Protein:  100-115g/day  Fluid:  2.1-2.4L/day  Koleen Distance MS, RD, LDN Please refer to [facility] for RD and/or RD on-call/weekend/after hours pager

## 2021-06-18 NOTE — Consult Note (Signed)
MEDICATION RELATED CONSULT NOTE - FOLLOW UP   Pharmacy Consult for hypertonic saline Indication: hyponatremia  Allergies  Allergen Reactions   Bee Venom Swelling   Elemental Sulfur Other (See Comments)    Acute renal failure   Other Other (See Comments)    Chemo medicine- patient not sure about name - swelling & rashes   Sulfa Antibiotics    Namenda [Memantine] Nausea Only    Nausea and fatigue    Patient Measurements: Height: 6' (182.9 cm) Weight: 66.9 kg (147 lb 7.8 oz) IBW/kg (Calculated) : 77.6   Vital Signs: Temp: 98 F (36.7 C) (07/27 1553) Temp Source: Oral (07/27 1553) BP: 120/75 (07/27 1600) Pulse Rate: 82 (07/27 1600) Intake/Output from previous day: 07/26 0701 - 07/27 0700 In: 1000 [IV Piggyback:1000] Out: -  Intake/Output from this shift: Total I/O In: 949.7 [P.O.:360; I.V.:589.7] Out: 200 [Urine:200]  Labs: Recent Labs    06/17/21 1105 06/17/21 2013 06/17/21 2013 06/18/21 0212 06/18/21 0650 06/18/21 1030 06/18/21 1745  WBC 6.1 4.5  --   --  5.4  --   --   HGB 11.4* 10.5*  --   --  10.8*  --   --   HCT 30.4* RESULTS UNAVAILABLE DUE TO INTERFERING SUBSTANCE  --   --  28.9*  --   --   PLT 251 226  --   --  217  --   --   CREATININE 0.70 0.50*   < > 0.47*  --  0.47* 0.56*  ALBUMIN 3.2*  --   --   --   --   --   --   PROT 7.1  --   --   --   --   --   --   AST 37  --   --   --   --   --   --   ALT 23  --   --   --   --   --   --   ALKPHOS 84  --   --   --   --   --   --   BILITOT 0.5  --   --   --   --   --   --    < > = values in this interval not displayed.    Estimated Creatinine Clearance: 94.1 mL/min (A) (by C-G formula based on SCr of 0.56 mg/dL (L)).   Microbiology: Recent Results (from the past 720 hour(s))  SARS CORONAVIRUS 2 (TAT 6-24 HRS) Nasopharyngeal Nasopharyngeal Swab     Status: None   Collection Time: 05/20/21  4:20 PM   Specimen: Nasopharyngeal Swab  Result Value Ref Range Status   SARS Coronavirus 2 NEGATIVE NEGATIVE  Final    Comment: (NOTE) SARS-CoV-2 target nucleic acids are NOT DETECTED.  The SARS-CoV-2 RNA is generally detectable in upper and lower respiratory specimens during the acute phase of infection. Negative results do not preclude SARS-CoV-2 infection, do not rule out co-infections with other pathogens, and should not be used as the sole basis for treatment or other patient management decisions. Negative results must be combined with clinical observations, patient history, and epidemiological information. The expected result is Negative.  Fact Sheet for Patients: SugarRoll.be  Fact Sheet for Healthcare Providers: https://www.woods-mathews.com/  This test is not yet approved or cleared by the [place] FDA and  has been authorized for detection and/or diagnosis of SARS-CoV-2 by FDA under an Emergency Use Authorization (EUA). This EUA will remain  in  effect (meaning this test can be used) for the duration of the COVID-19 declaration under Se ction 564(b)(1) of the Act, 21 U.S.C. section 360bbb-3(b)(1), unless the authorization is terminated or revoked sooner.  Performed at [facility], [place] [address], [place], [place] [address]   Resp Panel by RT-PCR (Flu A&B, Covid) Nasopharyngeal Swab     Status: None   Collection Time: 06/03/21  2:23 PM   Specimen: Nasopharyngeal Swab; Nasopharyngeal(NP) swabs in vial transport medium  Result Value Ref Range Status   SARS Coronavirus 2 by RT PCR NEGATIVE NEGATIVE Final    Comment: (NOTE) SARS-CoV-2 target nucleic acids are NOT DETECTED.  The SARS-CoV-2 RNA is generally detectable in upper respiratory specimens during the acute phase of infection. The lowest concentration of SARS-CoV-2 viral copies this assay can detect is 138 copies/mL. A negative result does not preclude SARS-Cov-2 infection and should not be used as the sole basis for treatment or other patient management decisions. A  negative result may occur with  improper specimen collection/handling, submission of specimen other than nasopharyngeal swab, presence of viral mutation(s) within the areas targeted by this assay, and inadequate number of viral copies(<138 copies/mL). A negative result must be combined with clinical observations, patient history, and epidemiological information. The expected result is Negative.  Fact Sheet for Patients:  EntrepreneurPulse.com.au  Fact Sheet for Healthcare Providers:  IncredibleEmployment.be  This test is no t yet approved or cleared by the [place] FDA and  has been authorized for detection and/or diagnosis of SARS-CoV-2 by FDA under an Emergency Use Authorization (EUA). This EUA will remain  in effect (meaning this test can be used) for the duration of the COVID-19 declaration under Section 564(b)(1) of the Act, 21 U.S.C.section 360bbb-3(b)(1), unless the authorization is terminated  or revoked sooner.       Influenza A by PCR NEGATIVE NEGATIVE Final   Influenza B by PCR NEGATIVE NEGATIVE Final    Comment: (NOTE) The Xpert Xpress SARS-CoV-2/FLU/RSV plus assay is intended as an aid in the diagnosis of influenza from Nasopharyngeal swab specimens and should not be used as a sole basis for treatment. Nasal washings and aspirates are unacceptable for Xpert Xpress SARS-CoV-2/FLU/RSV testing.  Fact Sheet for Patients: EntrepreneurPulse.com.au  Fact Sheet for Healthcare Providers: IncredibleEmployment.be  This test is not yet approved or cleared by the [place] FDA and has been authorized for detection and/or diagnosis of SARS-CoV-2 by FDA under an Emergency Use Authorization (EUA). This EUA will remain in effect (meaning this test can be used) for the duration of the COVID-19 declaration under Section 564(b)(1) of the Act, 21 U.S.C. section 360bbb-3(b)(1), unless the authorization  is terminated or revoked.  Performed at [facility], [address] [place], [place] [address]   MRSA Next Gen by PCR, Nasal     Status: None   Collection Time: 06/03/21  5:41 PM   Specimen: Nasal Mucosa; Nasal Swab  Result Value Ref Range Status   MRSA by PCR Next Gen NOT DETECTED NOT DETECTED Final    Comment: (NOTE) The GeneXpert MRSA Assay (FDA approved for NASAL specimens only), is one component of a comprehensive MRSA colonization surveillance program. It is not intended to diagnose MRSA infection nor to guide or monitor treatment for MRSA infections. Test performance is not FDA approved in patients less than 64 years old. Performed at [facility], [address] [place],  [address]   Resp Panel by RT-PCR (Flu A&B, Covid) Nasopharyngeal Swab  Status: None   Collection Time: 06/17/21  3:20 PM   Specimen: Nasopharyngeal Swab; Nasopharyngeal(NP) swabs in vial transport medium  Result Value Ref Range Status   SARS Coronavirus 2 by RT PCR NEGATIVE NEGATIVE Final    Comment: (NOTE) SARS-CoV-2 target nucleic acids are NOT DETECTED.  The SARS-CoV-2 RNA is generally detectable in upper respiratory specimens during the acute phase of infection. The lowest concentration of SARS-CoV-2 viral copies this assay can detect is 138 copies/mL. A negative result does not preclude SARS-Cov-2 infection and should not be used as the sole basis for treatment or other patient management decisions. A negative result may occur with  improper specimen collection/handling, submission of specimen other than nasopharyngeal swab, presence of viral mutation(s) within the areas targeted by this assay, and inadequate number of viral copies(<138 copies/mL). A negative result must be combined with clinical observations, patient history, and epidemiological information. The expected result is Negative.  Fact Sheet for Patients:   EntrepreneurPulse.com.au  Fact Sheet for Healthcare Providers:  IncredibleEmployment.be  This test is no t yet approved or cleared by the [place] FDA and  has been authorized for detection and/or diagnosis of SARS-CoV-2 by FDA under an Emergency Use Authorization (EUA). This EUA will remain  in effect (meaning this test can be used) for the duration of the COVID-19 declaration under Section 564(b)(1) of the Act, 21 U.S.C.section 360bbb-3(b)(1), unless the authorization is terminated  or revoked sooner.       Influenza A by PCR NEGATIVE NEGATIVE Final   Influenza B by PCR NEGATIVE NEGATIVE Final    Comment: (NOTE) The Xpert Xpress SARS-CoV-2/FLU/RSV plus assay is intended as an aid in the diagnosis of influenza from Nasopharyngeal swab specimens and should not be used as a sole basis for treatment. Nasal washings and aspirates are unacceptable for Xpert Xpress SARS-CoV-2/FLU/RSV testing.  Fact Sheet for Patients: EntrepreneurPulse.com.au  Fact Sheet for Healthcare Providers: IncredibleEmployment.be  This test is not yet approved or cleared by the [place] FDA and has been authorized for detection and/or diagnosis of SARS-CoV-2 by FDA under an Emergency Use Authorization (EUA). This EUA will remain in effect (meaning this test can be used) for the duration of the COVID-19 declaration under Section 564(b)(1) of the Act, 21 U.S.C. section 360bbb-3(b)(1), unless the authorization is terminated or revoked.  Performed at [facility], [address], [place], [place] [address]     Assessment: 60 y.o. 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, history of prostate cancer,GERD, HTN, HLD, and SIADH with history of low NA who was referred to the ED from oncology clinic for a sodium level of  115  Date Time Sodium Rate/Comment 7/26 1105 115       7/26 2013 114  -1 / 9hrs (started 3% saline 44ml/hr @2015 ) 7/26 2252 115  +1 / 3hrs 7/27 0212 117  +2 / 3hrs 7/27 1030 119  +2 / 10hrs 7/27 1745 120  +1/ 8 hours   Goal of Therapy:  Increase Na by 4-6 mEq/L in 4-6 hours  Plan:  Sodium trend increasing at safe rate.  Continue 3% sodium chloride at 30 mL/hr. Monitor Na every 4 hours Call RN to stop infusion and notify MD if Na increases by 4 mEq/L or more in the first 2 hours or Na increases by 6 mEq/L or more in the first 4 hours  Dorothe Pea, PharmD, BCPS 06/18/2021,6:42 PM

## 2021-06-18 NOTE — Progress Notes (Signed)
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  Continue on 3% NA at 30cc/hr as per nephro. Nephro consulted and recs apprec. Na level Q6H. Continue w/ neuro checks    Small cell lung cancer: management per onco    Normocytic anemia: H&H are labile. No need for a transfusion currently   COPD: w/o exacerbation. Continue on bronchodilators   GERD: continue on PPI      DVT prophylaxis: lovenox  Code Status: full  Family Communication:  Disposition Plan:   Level of care: Stepdown  Status is: Inpatient  Remains inpatient appropriate because:IV treatments appropriate due to intensity of illness or inability to take PO and Inpatient level of care appropriate due to severity of illness  Dispo: The patient is from: Home              Anticipated d/c is to: Home              Patient currently is not medically stable to d/c.   Difficult to place patient: unclear    Consultants:  Nephro   Procedures:   Antimicrobials:    Subjective: Pt c/o fatigue  Objective: Vitals:   06/18/21 0300 06/18/21 0400 06/18/21 0500 06/18/21 0600  BP:    129/81  Pulse: 78 72 72 72  Resp: (!) 21 18 17 16   Temp:  98.1 F (36.7 C)    TempSrc:  Oral    SpO2: 99% 98% 100% 99%  Weight:   66.9 kg   Height:        Intake/Output Summary (Last 24 hours) at 06/18/2021 0924 Last data filed at 06/18/2021 0900 Gross per 24 hour  Intake 1437.5 ml  Output --  Net 1437.5 ml   Filed Weights   06/17/21 1339 06/17/21 2200 06/18/21 0500  Weight: 67.1 kg 66.1 kg 66.9 kg    Examination:  General exam: Appears calm and comfortable  Respiratory system: Clear to auscultation. Respiratory effort normal. Cardiovascular system: S1 & S2 +. No  rubs, gallops or clicks. Gastrointestinal system: Abdomen is  nondistended, soft and nontender. Normal bowel sounds heard. Central nervous system: Alert and oriented. Moves all extremities  Psychiatry: Judgement and insight appear normal. Flat mood and affect     Data Reviewed: I have personally reviewed following labs and imaging studies  CBC: Recent Labs  Lab 06/17/21 1105 06/17/21 2013 06/18/21 0650  WBC 6.1 4.5 5.4  NEUTROABS 4.9  --   --   HGB 11.4* 10.5* 10.8*  HCT 30.4* RESULTS UNAVAILABLE DUE TO INTERFERING SUBSTANCE 28.9*  MCV 93.0 RESULTS UNAVAILABLE DUE TO INTERFERING SUBSTANCE 94.1  PLT 251 226 500   Basic Metabolic Panel: Recent Labs  Lab 06/17/21 1105 06/17/21 2013 06/17/21 2252 06/18/21 0212  NA 115* 114* 115* 117*  K 4.1 3.5  --  3.5  CL 81* 83*  --  86*  CO2 23 23  --  23  GLUCOSE 93 105*  --  87  BUN 8 8  --  6  CREATININE 0.70 0.50*  --  0.47*  CALCIUM 9.2 8.3*  --  8.0*   GFR: Estimated Creatinine Clearance: 94.1 mL/min (A) (by C-G formula based on SCr of 0.47 mg/dL (L)). Liver Function Tests: Recent Labs  Lab 06/17/21 1105  AST 37  ALT 23  ALKPHOS 84  BILITOT 0.5  PROT 7.1  ALBUMIN 3.2*   No results for input(s): LIPASE, AMYLASE in the last 168 hours. No results for input(s): AMMONIA in the last 168 hours. Coagulation Profile: No results for input(s): INR, PROTIME in the last 168 hours. Cardiac Enzymes: No results for input(s): CKTOTAL, CKMB, CKMBINDEX, TROPONINI in the last 168 hours. BNP (last 3 results) No results for input(s): PROBNP in the last 8760 hours. HbA1C: No results for input(s): HGBA1C in the last 72 hours. CBG: Recent Labs  Lab 06/17/21 2159  GLUCAP 93   Lipid Profile: No results for input(s): CHOL, HDL, LDLCALC, TRIG, CHOLHDL, LDLDIRECT in the last 72 hours. Thyroid Function Tests: Recent Labs    06/17/21 2013  TSH 1.083   Anemia Panel: No results for input(s): VITAMINB12, FOLATE, FERRITIN, TIBC, IRON, RETICCTPCT in the last 72 hours. Sepsis Labs: No results for  input(s): PROCALCITON, LATICACIDVEN in the last 168 hours.  Recent Results (from the past 240 hour(s))  Resp Panel by RT-PCR (Flu A&B, Covid) Nasopharyngeal Swab     Status: None   Collection Time: 06/17/21  3:20 PM   Specimen: Nasopharyngeal Swab; Nasopharyngeal(NP) swabs in vial transport medium  Result Value Ref Range Status   SARS Coronavirus 2 by RT PCR NEGATIVE NEGATIVE Final    Comment: (NOTE) SARS-CoV-2 target nucleic acids are NOT DETECTED.  The SARS-CoV-2 RNA is generally detectable in upper respiratory specimens during the acute phase of infection. The lowest concentration of SARS-CoV-2 viral copies this assay can detect is 138 copies/mL. A negative result does not preclude SARS-Cov-2 infection and should not be used as the sole basis for treatment or other patient management decisions. A negative result may occur with  improper specimen collection/handling, submission of specimen other than nasopharyngeal swab, presence of viral mutation(s) within the areas targeted by this assay, and inadequate number of viral copies(<138 copies/mL). A negative result must be combined with clinical observations, patient history, and epidemiological information. The expected result is Negative.  Fact Sheet for Patients:  EntrepreneurPulse.com.au  Fact Sheet for Healthcare Providers:  IncredibleEmployment.be  This test is no t yet approved or cleared by the [place] FDA and  has been authorized for detection and/or diagnosis of SARS-CoV-2 by FDA under an Emergency Use Authorization (EUA). This EUA will remain  in effect (meaning this test can be used) for the duration of the COVID-19 declaration under Section 564(b)(1) of the Act, 21 U.S.C.section 360bbb-3(b)(1), unless the authorization is terminated  or revoked sooner.       Influenza A by PCR NEGATIVE NEGATIVE Final   Influenza B by PCR NEGATIVE NEGATIVE Final    Comment: (NOTE) The  Xpert Xpress SARS-CoV-2/FLU/RSV plus assay is intended as an aid in the diagnosis of influenza from Nasopharyngeal swab specimens and should not be used as a sole basis for treatment. Nasal washings and aspirates are unacceptable for Xpert Xpress SARS-CoV-2/FLU/RSV testing.  Fact Sheet for Patients: EntrepreneurPulse.com.au  Fact Sheet for Healthcare Providers: IncredibleEmployment.be  This test is not yet approved or cleared by the [place] FDA and has been authorized for detection and/or diagnosis of SARS-CoV-2 by FDA under an Emergency Use Authorization (EUA). This EUA will remain in effect (meaning this test can be used) for the duration of the COVID-19 declaration under Section 564(b)(1) of the Act, 21 U.S.C. section 360bbb-3(b)(1), unless the authorization is terminated or revoked.  Performed at [facility], [address] [place], [place] [address]  Radiology Studies: No results found.      Scheduled Meds:  aspirin EC  81 mg Oral Daily   Budeson-Glycopyrrol-Formoterol  2 puff Inhalation BID   Chlorhexidine Gluconate Cloth  6 each Topical Daily   enoxaparin (LOVENOX) injection  40 mg Subcutaneous Q24H   famotidine  20 mg Oral BID   multivitamin with minerals  1 tablet Oral Daily   pantoprazole  40 mg Oral q morning   sodium chloride flush  3 mL Intravenous Q12H   Continuous Infusions:  sodium chloride (hypertonic) 30 mL/hr at 06/18/21 0744     LOS: 1 day    Time spent: 32 mins     Wyvonnia Dusky, MD [facility] Pager [phone]  If 7PM-7AM, please contact night-coverage 06/18/2021, 9:24 AM

## 2021-06-18 NOTE — Telephone Encounter (Signed)
Attempted to call pt's wife, [name] regarding message sent via [place]. No answer. LVM for return call.

## 2021-06-19 LAB — HEPATIC FUNCTION PANEL
ALT: 22 U/L (ref 0–44)
AST: 31 U/L (ref 15–41)
Albumin: 3 g/dL — ABNORMAL LOW (ref 3.5–5.0)
Alkaline Phosphatase: 62 U/L (ref 38–126)
Bilirubin, Direct: <0.1 mg/dL (ref 0.0–0.2)
Total Bilirubin: 0.4 mg/dL (ref 0.3–1.2)
Total Protein: 6.2 g/dL — ABNORMAL LOW (ref 6.5–8.1)

## 2021-06-19 LAB — BASIC METABOLIC PANEL
Anion gap: 6 (ref 5–15)
BUN: 5 mg/dL — ABNORMAL LOW (ref 6–20)
CO2: 23 mmol/L (ref 22–32)
Calcium: 8.2 mg/dL — ABNORMAL LOW (ref 8.9–10.3)
Chloride: 93 mmol/L — ABNORMAL LOW (ref 98–111)
Creatinine, Ser: 0.39 mg/dL — ABNORMAL LOW (ref 0.61–1.24)
GFR, Estimated: >60 mL/min (ref >60)
Glucose, Bld: 88 mg/dL (ref 70–99)
Potassium: 3.3 mmol/L — ABNORMAL LOW (ref 3.5–5.1)
Sodium: 122 mmol/L — ABNORMAL LOW (ref 135–145)

## 2021-06-19 LAB — CBC
HCT: 25 % — ABNORMAL LOW (ref 39.0–52.0)
Hemoglobin: 9.3 g/dL — ABNORMAL LOW (ref 13.0–17.0)
MCH: 35.8 pg — ABNORMAL HIGH (ref 26.0–34.0)
MCHC: 37.2 g/dL — ABNORMAL HIGH (ref 30.0–36.0)
MCV: 96.2 fL (ref 80.0–100.0)
Platelets: 188 10*3/uL (ref 150–400)
RBC: 2.6 MIL/uL — ABNORMAL LOW (ref 4.22–5.81)
RDW: 13 % (ref 11.5–15.5)
WBC: 4 10*3/uL (ref 4.0–10.5)
nRBC: 0 % (ref 0.0–0.2)

## 2021-06-19 LAB — SODIUM
Sodium: 124 mmol/L — ABNORMAL LOW (ref 135–145)
Sodium: 125 mmol/L — ABNORMAL LOW (ref 135–145)
Sodium: 133 mmol/L — ABNORMAL LOW (ref 135–145)

## 2021-06-19 MED ORDER — POTASSIUM CHLORIDE CRYS ER 20 MEQ PO TBCR
40.0000 meq | EXTENDED_RELEASE_TABLET | Freq: Once | ORAL | Status: AC
  Administered 2021-06-19: 40 meq via ORAL
  Filled 2021-06-19: qty 2

## 2021-06-19 MED ORDER — TOLVAPTAN 15 MG PO TABS
15.0000 mg | ORAL_TABLET | Freq: Once | ORAL | Status: AC
  Administered 2021-06-19: 15 mg via ORAL
  Filled 2021-06-19: qty 1

## 2021-06-19 NOTE — Consult Note (Signed)
[facility] NOTE   Pharmacy Consult for Hypertonic Saline Indication: Hyponatremia  Patient Measurements: Height: 6' (182.9 cm) Weight: 66.9 kg (147 lb 7.8 oz) IBW/kg (Calculated) : 77.6  Intake/Output from previous day: 07/27 0701 - 07/28 0700 In: 949.7 [P.O.:360; I.V.:589.7] Out: 700 [Urine:700] Intake/Output from this shift: Total I/O In: 413.5 [I.V.:413.5] Out: 400 [Urine:400]  Labs: Recent Labs    06/17/21 1105 06/17/21 2013 06/18/21 0212 06/18/21 0650 06/18/21 1030 06/18/21 1745 06/18/21 1956 06/19/21 0138  WBC 6.1 4.5  --  5.4  --   --   --  4.0  HGB 11.4* 10.5*  --  10.8*  --   --   --  9.3*  HCT 30.4* RESULTS UNAVAILABLE DUE TO INTERFERING SUBSTANCE  --  28.9*  --   --   --  25.0*  PLT 251 226  --  217  --   --   --  188  CREATININE 0.70 0.50*   < >  --    < > 0.56* 0.56* 0.39*  ALBUMIN 3.2*  --   --   --   --   --   --   --   PROT 7.1  --   --   --   --   --   --   --   AST 37  --   --   --   --   --   --   --   ALT 23  --   --   --   --   --   --   --   ALKPHOS 84  --   --   --   --   --   --   --   BILITOT 0.5  --   --   --   --   --   --   --    < > = values in this interval not displayed.    Estimated Creatinine Clearance: 94.1 mL/min (A) (by C-G formula based on SCr of 0.39 mg/dL (L)).    Assessment: 60 y.o. 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, history of prostate cancer,GERD, HTN, HLD, and SIADH with history of low NA who was referred to the ED from oncology clinic for a sodium level of 115  Date Time Sodium Rate/Comment 7/26 1105 115      7/26 2013 114  -1 / 9hrs 7/26 2252 115  +1 / 3hrs 7/27 0212 117  +2 / 3hrs 7/27 1030 119  +2 / 10hrs 7/27 1745 120  +1/ 8hrs 7/27 1956 121  +1 / 2hrs 7/28 0138 122  +1 / 6hrs 7/28 0652 124  +2 / 5hrs   Goal of Therapy:  Increase Na by 0.5 mmol/L/hr (no greater than 12 mmol/L/day)  Plan:  Sodium trend increasing at safe rate Continue 3% sodium  chloride at 30 mL/hr Monitor Na every 4 hours  Benita Gutter 06/19/2021,8:10 AM

## 2021-06-19 NOTE — Progress Notes (Signed)
[facility]  ROUNDING NOTE   Subjective:   Richard Santos is a 60 y.o. 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 He was admitted to [facility] for Hyponatremia [E87.1] Weakness [R53.1]   We have been consulted to evaluate and assist with managing severely low sodium levels. Patient is known to this practice from a recent admission for same concern.   Patient seen resting in bed Alert and oriented Tolerating small meals Denies diarrhea  Na 124  Objective:  Vital signs in last 24 hours:  Temp:  [98 F (36.7 C)-98.2 F (36.8 C)] 98 F (36.7 C) (07/28 1200) Pulse Rate:  [82] 82 (07/27 1600) Resp:  [11-23] 16 (07/28 1200) BP: (106-121)/(58-101) 113/75 (07/28 1200) SpO2:  [97 %-99 %] 97 % (07/28 0400)  Weight change:  Filed Weights   06/17/21 1339 06/17/21 2200 06/18/21 0500  Weight: 67.1 kg 66.1 kg 66.9 kg    Intake/Output: I/O last 3 completed shifts: In: 949.7 [P.O.:360; I.V.:589.7] Out: 700 [Urine:700]   Intake/Output this shift:  Total I/O In: 1058.5 [P.O.:480; I.V.:578.5] Out: 700 [Urine:700]  Physical Exam: General: NAD, resting in bed  Head: Normocephalic, atraumatic. Moist oral mucosal membranes  Eyes: Anicteric  Lungs:  Clear to auscultation, normal breathing effort  Heart: Regular rate and rhythm  Abdomen:  Soft, nontender  Extremities:  no peripheral edema.  Neurologic: Alert, moving all four extremities  Skin: No lesions       Basic Metabolic Panel: Recent Labs  Lab 06/18/21 0212 06/18/21 1030 06/18/21 1745 06/18/21 1956 06/19/21 0138 06/19/21 0652 06/19/21 1323  NA 117* 119* 120* 121* 122* 124* 125*  K 3.5 3.6 3.6 3.3* 3.3*  --   --   CL 86* 86* 91* 90* 93*  --   --   CO2 23 23 23 22 23   --   --   GLUCOSE 87 98 95 142* 88  --   --   BUN 6 5* 5* <5* 5*  --   --   CREATININE 0.47* 0.47* 0.56* 0.56* 0.39*  --   --    CALCIUM 8.0* 8.4* 8.5* 8.6* 8.2*  --   --      Liver Function Tests: Recent Labs  Lab 06/17/21 1105 06/19/21 1323  AST 37 31  ALT 23 22  ALKPHOS 84 62  BILITOT 0.5 0.4  PROT 7.1 6.2*  ALBUMIN 3.2* 3.0*    No results for input(s): LIPASE, AMYLASE in the last 168 hours. No results for input(s): AMMONIA in the last 168 hours.  CBC: Recent Labs  Lab 06/17/21 1105 06/17/21 2013 06/18/21 0650 06/19/21 0138  WBC 6.1 4.5 5.4 4.0  NEUTROABS 4.9  --   --   --   HGB 11.4* 10.5* 10.8* 9.3*  HCT 30.4* RESULTS UNAVAILABLE DUE TO INTERFERING SUBSTANCE 28.9* 25.0*  MCV 93.0 RESULTS UNAVAILABLE DUE TO INTERFERING SUBSTANCE 94.1 96.2  PLT 251 226 217 188     Cardiac Enzymes: No results for input(s): CKTOTAL, CKMB, CKMBINDEX, TROPONINI in the last 168 hours.  BNP: Invalid input(s): POCBNP  CBG: Recent Labs  Lab 06/17/21 2159  GLUCAP 93     Microbiology: Results for orders placed or performed during the hospital encounter of 06/17/21  Resp Panel by RT-PCR (Flu A&B, Covid) Nasopharyngeal Swab     Status: None   Collection Time: 06/17/21  3:20 PM   Specimen: Nasopharyngeal Swab; Nasopharyngeal(NP)  swabs in vial transport medium  Result Value Ref Range Status   SARS Coronavirus 2 by RT PCR NEGATIVE NEGATIVE Final    Comment: (NOTE) SARS-CoV-2 target nucleic acids are NOT DETECTED.  The SARS-CoV-2 RNA is generally detectable in upper respiratory specimens during the acute phase of infection. The lowest concentration of SARS-CoV-2 viral copies this assay can detect is 138 copies/mL. A negative result does not preclude SARS-Cov-2 infection and should not be used as the sole basis for treatment or other patient management decisions. A negative result may occur with  improper specimen collection/handling, submission of specimen other than nasopharyngeal swab, presence of viral mutation(s) within the areas targeted by this assay, and inadequate number of viral copies(<138  copies/mL). A negative result must be combined with clinical observations, patient history, and epidemiological information. The expected result is Negative.  Fact Sheet for Patients:  EntrepreneurPulse.com.au  Fact Sheet for Healthcare Providers:  IncredibleEmployment.be  This test is no t yet approved or cleared by the [place] FDA and  has been authorized for detection and/or diagnosis of SARS-CoV-2 by FDA under an Emergency Use Authorization (EUA). This EUA will remain  in effect (meaning this test can be used) for the duration of the COVID-19 declaration under Section 564(b)(1) of the Act, 21 U.S.C.section 360bbb-3(b)(1), unless the authorization is terminated  or revoked sooner.       Influenza A by PCR NEGATIVE NEGATIVE Final   Influenza B by PCR NEGATIVE NEGATIVE Final    Comment: (NOTE) The Xpert Xpress SARS-CoV-2/FLU/RSV plus assay is intended as an aid in the diagnosis of influenza from Nasopharyngeal swab specimens and should not be used as a sole basis for treatment. Nasal washings and aspirates are unacceptable for Xpert Xpress SARS-CoV-2/FLU/RSV testing.  Fact Sheet for Patients: EntrepreneurPulse.com.au  Fact Sheet for Healthcare Providers: IncredibleEmployment.be  This test is not yet approved or cleared by the [place] FDA and has been authorized for detection and/or diagnosis of SARS-CoV-2 by FDA under an Emergency Use Authorization (EUA). This EUA will remain in effect (meaning this test can be used) for the duration of the COVID-19 declaration under Section 564(b)(1) of the Act, 21 U.S.C. section 360bbb-3(b)(1), unless the authorization is terminated or revoked.  Performed at [facility], [address] [place], [place] [address]     Coagulation Studies: No results for input(s): LABPROT, INR in the last 72 hours.  Urinalysis: No results for input(s):  COLORURINE, LABSPEC, PHURINE, GLUCOSEU, HGBUR, BILIRUBINUR, KETONESUR, PROTEINUR, UROBILINOGEN, NITRITE, LEUKOCYTESUR in the last 72 hours.  Invalid input(s): APPERANCEUR    Imaging: No results found.   Medications:      aspirin EC  81 mg Oral Daily   Budeson-Glycopyrrol-Formoterol  2 puff Inhalation BID   Chlorhexidine Gluconate Cloth  6 each Topical Daily   dronabinol  2.5 mg Oral BID AC   enoxaparin (LOVENOX) injection  40 mg Subcutaneous Q24H   famotidine  20 mg Oral BID   feeding supplement  1 Container Oral TID BM   multivitamin with minerals  1 tablet Oral Daily   pantoprazole  40 mg Oral q morning   sodium chloride flush  3 mL Intravenous Q12H   tolvaptan  15 mg Oral Once   acetaminophen **OR** acetaminophen, albuterol, HYDROcodone-acetaminophen, loperamide, ondansetron **OR** ondansetron (ZOFRAN) IV, polyethylene glycol, prochlorperazine  Assessment/ Plan:  [name] is a 60 y.o.  male with a past medical history of GERD, hypertension, SIADH, HLD, prostate cancer and small cell lung cancer with  metastatic disease to the adrenal gland. He was admitted to [facility] for  Hyponatremia likely caused by SIADH vs adrenal gland dysfunction related to cancer metastasis. Admission level 115 During previous admission, sodium stabilized around 125-127 at discharge with Tolvaptan. Tolvaptan to be given outpatient once discharged. Patient admitted to ICU and placed on IVF 3%. Current level 124, will stop IVF. Order Tolvaptan 15mg  once and monitor labs.   2. Hypertension: stable with no medications    LOS: 2   7/28/20222:25 PM

## 2021-06-19 NOTE — Progress Notes (Signed)
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  D/c 3% NaCl and start tolvaptan today as per nephro. Na level Q8H. Will continue to monitor    Small cell lung cancer: management per onco as an outpatient   Normocytic anemia: H&H are labile. Will transfuse if Hb < 7.0   COPD: w/o exacerbation. Continue on bronchodilators    GERD: continue on pantoprazole       DVT prophylaxis: lovenox  Code Status: full  Family Communication:  Disposition Plan:   Level of care: Stepdown  Status is: Inpatient  Remains inpatient appropriate because:IV treatments appropriate due to intensity of illness or inability to take PO and Inpatient level of care appropriate due to severity of illness  Dispo: The patient is from: Home              Anticipated d/c is to: Home              Patient currently is not medically stable to d/c.   Difficult to place patient: unclear    Consultants:  Nephro   Procedures:   Antimicrobials:    Subjective: Pt c/o fatigue  Objective: Vitals:   06/19/21 0200 06/19/21 0300 06/19/21 0400 06/19/21 0700  BP: 121/78 110/78 (!) 120/101 119/68  Pulse:      Resp: 12 12 16 17   Temp:      TempSrc:      SpO2:   97%   Weight:      Height:        Intake/Output Summary (Last 24 hours) at 06/19/2021 0914 Last data filed at 06/19/2021 0900 Gross per 24 hour  Intake 1285.69 ml  Output 1300 ml  Net -14.31 ml   Filed Weights   06/17/21 1339 06/17/21 2200 06/18/21 0500  Weight: 67.1 kg 66.1 kg 66.9 kg    Examination:  General exam: Appears comfortable  Respiratory system: clear breath sounds b/l Cardiovascular system: S1/S2+. No rubs or clicks Gastrointestinal system: Abd is soft, NT, ND & hypoactive bowel sounds Central nervous system: Alert and oriented. Moves  all extremities   Psychiatry: judgement and insight appear normal. Flat mood and affect    Data Reviewed: I have personally reviewed following labs and imaging studies  CBC: Recent Labs  Lab 06/17/21 1105 06/17/21 2013 06/18/21 0650 06/19/21 0138  WBC 6.1 4.5 5.4 4.0  NEUTROABS 4.9  --   --   --   HGB 11.4* 10.5* 10.8* 9.3*  HCT 30.4* RESULTS UNAVAILABLE DUE TO INTERFERING SUBSTANCE 28.9* 25.0*  MCV 93.0 RESULTS UNAVAILABLE DUE TO INTERFERING SUBSTANCE 94.1 96.2  PLT 251 226 217 729   Basic Metabolic Panel: Recent Labs  Lab 06/18/21 0212 06/18/21 1030 06/18/21 1745 06/18/21 1956 06/19/21 0138 06/19/21 0652  NA 117* 119* 120* 121* 122* 124*  K 3.5 3.6 3.6 3.3* 3.3*  --   CL 86* 86* 91* 90* 93*  --   CO2 23 23 23 22 23   --   GLUCOSE 87 98 95 142* 88  --   BUN 6 5* 5* <5* 5*  --   CREATININE 0.47* 0.47* 0.56* 0.56* 0.39*  --   CALCIUM 8.0* 8.4* 8.5* 8.6* 8.2*  --    GFR: Estimated Creatinine Clearance: 94.1 mL/min (A) (by C-G formula based on SCr  of 0.39 mg/dL (L)). Liver Function Tests: Recent Labs  Lab 06/17/21 1105  AST 37  ALT 23  ALKPHOS 84  BILITOT 0.5  PROT 7.1  ALBUMIN 3.2*   No results for input(s): LIPASE, AMYLASE in the last 168 hours. No results for input(s): AMMONIA in the last 168 hours. Coagulation Profile: No results for input(s): INR, PROTIME in the last 168 hours. Cardiac Enzymes: No results for input(s): CKTOTAL, CKMB, CKMBINDEX, TROPONINI in the last 168 hours. BNP (last 3 results) No results for input(s): PROBNP in the last 8760 hours. HbA1C: No results for input(s): HGBA1C in the last 72 hours. CBG: Recent Labs  Lab 06/17/21 2159  GLUCAP 93   Lipid Profile: No results for input(s): CHOL, HDL, LDLCALC, TRIG, CHOLHDL, LDLDIRECT in the last 72 hours. Thyroid Function Tests: Recent Labs    06/17/21 2013  TSH 1.083   Anemia Panel: No results for input(s): VITAMINB12, FOLATE, FERRITIN, TIBC, IRON, RETICCTPCT in the last 72  hours. Sepsis Labs: No results for input(s): PROCALCITON, LATICACIDVEN in the last 168 hours.  Recent Results (from the past 240 hour(s))  Resp Panel by RT-PCR (Flu A&B, Covid) Nasopharyngeal Swab     Status: None   Collection Time: 06/17/21  3:20 PM   Specimen: Nasopharyngeal Swab; Nasopharyngeal(NP) swabs in vial transport medium  Result Value Ref Range Status   SARS Coronavirus 2 by RT PCR NEGATIVE NEGATIVE Final    Comment: (NOTE) SARS-CoV-2 target nucleic acids are NOT DETECTED.  The SARS-CoV-2 RNA is generally detectable in upper respiratory specimens during the acute phase of infection. The lowest concentration of SARS-CoV-2 viral copies this assay can detect is 138 copies/mL. A negative result does not preclude SARS-Cov-2 infection and should not be used as the sole basis for treatment or other patient management decisions. A negative result may occur with  improper specimen collection/handling, submission of specimen other than nasopharyngeal swab, presence of viral mutation(s) within the areas targeted by this assay, and inadequate number of viral copies(<138 copies/mL). A negative result must be combined with clinical observations, patient history, and epidemiological information. The expected result is Negative.  Fact Sheet for Patients:  EntrepreneurPulse.com.au  Fact Sheet for Healthcare Providers:  IncredibleEmployment.be  This test is no t yet approved or cleared by the [place] FDA and  has been authorized for detection and/or diagnosis of SARS-CoV-2 by FDA under an Emergency Use Authorization (EUA). This EUA will remain  in effect (meaning this test can be used) for the duration of the COVID-19 declaration under Section 564(b)(1) of the Act, 21 U.S.C.section 360bbb-3(b)(1), unless the authorization is terminated  or revoked sooner.       Influenza A by PCR NEGATIVE NEGATIVE Final   Influenza B by PCR NEGATIVE  NEGATIVE Final    Comment: (NOTE) The Xpert Xpress SARS-CoV-2/FLU/RSV plus assay is intended as an aid in the diagnosis of influenza from Nasopharyngeal swab specimens and should not be used as a sole basis for treatment. Nasal washings and aspirates are unacceptable for Xpert Xpress SARS-CoV-2/FLU/RSV testing.  Fact Sheet for Patients: EntrepreneurPulse.com.au  Fact Sheet for Healthcare Providers: IncredibleEmployment.be  This test is not yet approved or cleared by the [place] FDA and has been authorized for detection and/or diagnosis of SARS-CoV-2 by FDA under an Emergency Use Authorization (EUA). This EUA will remain in effect (meaning this test can be used) for the duration of the COVID-19 declaration under Section 564(b)(1) of the Act, 21 U.S.C. section 360bbb-3(b)(1), unless the authorization is terminated or  revoked.  Performed at [facility], [address], [place], [place] [address]          Radiology Studies: No results found.      Scheduled Meds:  aspirin EC  81 mg Oral Daily   Budeson-Glycopyrrol-Formoterol  2 puff Inhalation BID   Chlorhexidine Gluconate Cloth  6 each Topical Daily   dronabinol  2.5 mg Oral BID AC   enoxaparin (LOVENOX) injection  40 mg Subcutaneous Q24H   famotidine  20 mg Oral BID   feeding supplement  1 Container Oral TID BM   multivitamin with minerals  1 tablet Oral Daily   pantoprazole  40 mg Oral q morning   potassium chloride  40 mEq Oral Once   sodium chloride flush  3 mL Intravenous Q12H   Continuous Infusions:  sodium chloride (hypertonic) 30 mL/hr at 06/19/21 0910     LOS: 2 days    Time spent: 30 mins     Wyvonnia Dusky, MD [facility] Pager [phone]  If 7PM-7AM, please contact night-coverage 06/19/2021, 9:14 AM

## 2021-06-19 NOTE — Consult Note (Signed)
[facility] NOTE   Pharmacy Consult for Tolvaptan Indication: Hyponatremia  Patient Measurements: Height: 6' (182.9 cm) Weight: 66.9 kg (147 lb 7.8 oz) IBW/kg (Calculated) : 77.6  Intake/Output from previous day: 07/27 0701 - 07/28 0700 In: 949.7 [P.O.:360; I.V.:589.7] Out: 700 [Urine:700] Intake/Output from this shift: Total I/O In: 1058.5 [P.O.:480; I.V.:578.5] Out: 700 [Urine:700]  Labs: Recent Labs    06/17/21 1105 06/17/21 2013 06/18/21 0212 06/18/21 0650 06/18/21 1030 06/18/21 1745 06/18/21 1956 06/19/21 0138  WBC 6.1 4.5  --  5.4  --   --   --  4.0  HGB 11.4* 10.5*  --  10.8*  --   --   --  9.3*  HCT 30.4* RESULTS UNAVAILABLE DUE TO INTERFERING SUBSTANCE  --  28.9*  --   --   --  25.0*  PLT 251 226  --  217  --   --   --  188  CREATININE 0.70 0.50*   < >  --    < > 0.56* 0.56* 0.39*  ALBUMIN 3.2*  --   --   --   --   --   --   --   PROT 7.1  --   --   --   --   --   --   --   AST 37  --   --   --   --   --   --   --   ALT 23  --   --   --   --   --   --   --   ALKPHOS 84  --   --   --   --   --   --   --   BILITOT 0.5  --   --   --   --   --   --   --    < > = values in this interval not displayed.    Estimated Creatinine Clearance: 94.1 mL/min (A) (by C-G formula based on SCr of 0.39 mg/dL (L)).    Assessment: 60 y.o. 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, history of prostate cancer,GERD, HTN, HLD, and SIADH with history of low NA who was referred to the ED from oncology clinic for a sodium level of 115  Date Time Sodium Rate/Comment 7/26 1105 115      7/26 2013 114  -1 / 9hrs 7/26 2252 115  +1 / 3hrs 7/27 0212 117  +2 / 3hrs 7/27 1030 119  +2 / 10hrs 7/27 1745 120  +1/ 8hrs 7/27 1956 121  +1 / 2hrs 7/28 0138 122  +1 / 6hrs 7/28 0652 124  +2 / 5hrs 7/28 1323 125  +1 / 6hrs   Goal of Therapy:  Goal correction of 4-6 mmol/L in 24 hours (maximum 8 mmol/L)  Plan:  --Hypertonic saline  stopped --Tolvaptan 15 mg x 1 dose today per nephrology --Remains on 1200 mL fluid restriction - may consider liberalizing this with initiation of tolvaptan  Benita Gutter 06/19/2021,1:57 PM

## 2021-06-20 ENCOUNTER — Telehealth: Payer: Self-pay | Admitting: Internal Medicine

## 2021-06-20 ENCOUNTER — Other Ambulatory Visit: Payer: Self-pay | Admitting: Internal Medicine

## 2021-06-20 ENCOUNTER — Telehealth: Payer: Self-pay | Admitting: Physician Assistant

## 2021-06-20 ENCOUNTER — Other Ambulatory Visit: Payer: Self-pay | Admitting: Physician Assistant

## 2021-06-20 DIAGNOSIS — C3412 Malignant neoplasm of upper lobe, left bronchus or lung: Secondary | ICD-10-CM

## 2021-06-20 LAB — PHOSPHORUS: Phosphorus: 4.1 mg/dL (ref 2.5–4.6)

## 2021-06-20 LAB — BASIC METABOLIC PANEL
Anion gap: 7 (ref 5–15)
BUN: <5 mg/dL — ABNORMAL LOW (ref 6–20)
CO2: 26 mmol/L (ref 22–32)
Calcium: 9.4 mg/dL (ref 8.9–10.3)
Chloride: 101 mmol/L (ref 98–111)
Creatinine, Ser: 0.5 mg/dL — ABNORMAL LOW (ref 0.61–1.24)
GFR, Estimated: >60 mL/min (ref >60)
Glucose, Bld: 99 mg/dL (ref 70–99)
Potassium: 3.8 mmol/L (ref 3.5–5.1)
Sodium: 134 mmol/L — ABNORMAL LOW (ref 135–145)

## 2021-06-20 LAB — CBC
HCT: 29.7 % — ABNORMAL LOW (ref 39.0–52.0)
Hemoglobin: 10.8 g/dL — ABNORMAL LOW (ref 13.0–17.0)
MCH: 35.6 pg — ABNORMAL HIGH (ref 26.0–34.0)
MCHC: 36.4 g/dL — ABNORMAL HIGH (ref 30.0–36.0)
MCV: 98 fL (ref 80.0–100.0)
Platelets: 267 10*3/uL (ref 150–400)
RBC: 3.03 MIL/uL — ABNORMAL LOW (ref 4.22–5.81)
RDW: 13.2 % (ref 11.5–15.5)
WBC: 3.5 10*3/uL — ABNORMAL LOW (ref 4.0–10.5)
nRBC: 0 % (ref 0.0–0.2)

## 2021-06-20 LAB — MAGNESIUM: Magnesium: 1.6 mg/dL — ABNORMAL LOW (ref 1.7–2.4)

## 2021-06-20 MED ORDER — FUROSEMIDE 20 MG PO TABS: 20.0000 mg | ORAL_TABLET | Freq: Every day | ORAL | 0 refills | Status: DC

## 2021-06-20 MED ORDER — SODIUM CHLORIDE 1 G PO TABS: 1.0000 g | ORAL_TABLET | Freq: Two times a day (BID) | ORAL | 0 refills | Status: AC

## 2021-06-20 NOTE — Evaluation (Signed)
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 Patient was recently hospitalized for same and upon discharge on 06/05/21 his serum sodium was 127. This adm:  put on 3% saline drip and then this was d/c and pt was given one dose of tolvaptan as per nephro and serum Na up to 134 on date of therapy eval.   Clinical Impression   Pt seen for OT evaluation this date in setting of acute hospitalization with hyponatremia. Pt presents this date reporting that he feels much better and feels ready to leave. Pt's spouse present throughout session. He reports being INDEP at baseline including driving and yard work, but does endorse he's had increasing difficulty getting up from the garden rolling the trash down the driveway. Pt somewhat deconditioned on OT assessment, but is able to complete all basic ADLs and some dynamic tasks (reaching to pick something up off the floor) without AD and without physical assistance. Pt does not appear to have acute OT needs, nor need for follow up at this time, but is encouraged to stay as active as possible/tolerable at home to avoid further atrophy. Pt and spouse with good understanding. Will sign off.     Follow Up Recommendations  No OT follow up    Equipment Recommendations  Tub/shower seat    Recommendations for Other Services       Precautions / Restrictions Precautions Precautions: Fall Restrictions Weight Bearing Restrictions: No      Mobility Bed Mobility Overal bed mobility: Independent                   Transfers Overall transfer level: Modified independent               General transfer comment: increased time, no physical assist, good lift off    Balance Overall balance assessment: Mild deficits observed, not formally tested                                         ADL either performed or assessed with clinical judgement   ADL Overall ADL's : Modified independent;At baseline                                       General ADL Comments: able to perform all aspects of basic self care, some difficulty with more dynamic tasks or IADLs d/t some deconditioning.     Vision Baseline Vision/History: Wears glasses Patient Visual Report: No change from baseline       Perception     Praxis      Pertinent Vitals/Pain Pain Assessment: No/denies pain     Hand Dominance     Extremity/Trunk Assessment Upper Extremity Assessment Upper Extremity Assessment: Overall WFL for tasks assessed;Generalized weakness (ROM WFL, MMT grossly 4-/5, breaks with light resistance)   Lower Extremity Assessment Lower Extremity Assessment: Overall WFL for tasks assessed;Generalized weakness       Communication Communication  Communication: No difficulties   Cognition Arousal/Alertness: Awake/alert Behavior During Therapy: WFL for tasks assessed/performed Overall Cognitive Status: Within Functional Limits for tasks assessed                                     General Comments  noted to have some small periods of retropulsion or posterior sway, he is able to self correct and does not require physical assitance or AD    Exercises Other Exercises Other Exercises: OT ed re: role of OT in acute setting.   Shoulder Instructions      Home Living Family/patient expects to be discharged to:: Private residence Living Arrangements: Spouse/significant other Available Help at Discharge: Family;Available PRN/intermittently Type of Home: House Home  Access: Stairs to enter [employer] of Steps: 3 Entrance Stairs-Rails: Right Home Layout: Two level;Able to live on main level with bedroom/bathroom Alternate Level Stairs-Number of Steps: 12             Home Equipment: None          Prior Functioning/Environment Level of Independence: Independent        Comments: driving and working in the yard, does endorse that getting up from the garden has been getting more difficult and that he has started avoiding going to second floor of the home.        OT Problem List: Decreased activity tolerance      OT Treatment/Interventions:      OT Goals(Current goals can be found in the care plan section) Acute Rehab OT Goals Patient Stated Goal: to go home OT Goal Formulation: All assessment and education complete, DC therapy  OT Frequency:     Barriers to D/C:            Co-evaluation              AM-PAC OT "6 Clicks" Daily Activity     Outcome Measure Help from another person eating meals?: None Help from another person taking care of personal grooming?: None Help from another person toileting, which includes using toliet, bedpan, or urinal?: None Help from another person bathing (including washing, rinsing, drying)?: None Help from another person to put on and taking off regular upper body clothing?: None Help from another person to put on and taking off regular lower body clothing?: None 6 Click Score: 24   End of Session Equipment Utilized During Treatment: Gait belt Nurse Communication: Mobility status  Activity Tolerance: Patient tolerated treatment well Patient left: Other (comment);with family/visitor present (seated EOB eating lunch with spouse present)  OT Visit Diagnosis: Unsteadiness on feet (R26.81)                Time: 4709-6283 OT Time Calculation (min): 21 min Charges:  OT General Charges $OT Visit: 1 Visit OT Evaluation $OT Eval Low Complexity: 1 Low OT Treatments $Self Care/Home  Management : 8-22 mins  Gerrianne Scale, MS, OTR/L ascom [phone] 06/20/21, 1:54 PM

## 2021-06-20 NOTE — Consult Note (Signed)
[facility] for Tolvaptan Indication: Hyponatremia  Patient Measurements: Height: 6' (182.9 cm) Weight: 66.9 kg (147 lb 7.8 oz) IBW/kg (Calculated) : 77.6  Intake/Output from previous day: 07/28 0701 - 07/29 0700 In: 1178.5 [P.O.:600; I.V.:578.5] Out: 3100 [Urine:3100] Intake/Output from this shift: Total I/O In: -  Out: 200 [Urine:200]  Labs: Recent Labs    06/17/21 1105 06/17/21 2013 06/18/21 0650 06/18/21 1030 06/18/21 1956 06/19/21 0138 06/19/21 1323 06/20/21 0430  WBC 6.1   < > 5.4  --   --  4.0  --  3.5*  HGB 11.4*   < > 10.8*  --   --  9.3*  --  10.8*  HCT 30.4*   < > 28.9*  --   --  25.0*  --  29.7*  PLT 251   < > 217  --   --  188  --  267  CREATININE 0.70   < >  --    < > 0.56* 0.39*  --  0.50*  MG  --   --   --   --   --   --   --  1.6*  ALBUMIN 3.2*  --   --   --   --   --  3.0*  --   PROT 7.1  --   --   --   --   --  6.2*  --   AST 37  --   --   --   --   --  31  --   ALT 23  --   --   --   --   --  22  --   ALKPHOS 84  --   --   --   --   --  62  --   BILITOT 0.5  --   --   --   --   --  0.4  --   BILIDIR  --   --   --   --   --   --  <0.1  --   IBILI  --   --   --   --   --   --  NOT CALCULATED  --    < > = values in this interval not displayed.    Estimated Creatinine Clearance: 94.1 mL/min (A) (by C-G formula based on SCr of 0.5 mg/dL (L)).    Assessment: 60 y.o. 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

## 2021-06-20 NOTE — TOC Initial Note (Signed)
Transition of Care [facility]) - Initial/Assessment Note    Patient Details  Name: Richard Santos MRN: [id number] Date of Birth: [birth year]  Transition of Care [facility]) CM/SW Contact:    Ova Freshwater Phone Number: [phone] 06/20/2021, 2:33 PM  Clinical Narrative:                  Patient [facility] due to low Na+115. Patient is currently receiving radiation and immunotherapy for stage 4 lung cancer. CSW spoke with patient's wife Mattis Featherly [phone] [facility]) who declined the need for HHPT or a shower chair. Ms. Christofferson stated she would prefer outpatient PT.  CSW stated I would refer the patient to the [facility] outpatient PT office. CSW updated Attending and ICU staff about conversation. Patient will discharge home with spouse.  Expected Discharge Plan: Home/Self Care Barriers to Discharge: No Barriers Identified   Patient Goals and CMS Choice Patient states their goals for this hospitalization and ongoing recovery are:: return home      Expected Discharge Plan and Services Expected Discharge Plan: Home/Self Care In-house Referral: Clinical Social Work   Post Acute Care Choice: NA Living arrangements for the past 2 months: Single Family Home Expected Discharge Date: 06/20/21                                    Prior Living Arrangements/Services Living arrangements for the past 2 months: Single Family Home Lives with:: Spouse Charlies, [place] (Spouse)   [phone] (Mobile)) Patient language and need for interpreter reviewed:: Yes Do you feel safe going back to the place where you live?: Yes      Need for Family Participation in Patient Care: Yes (Comment) Care giver support system in place?: Yes (comment)   Criminal Activity/Legal Involvement Pertinent to Current Situation/Hospitalization: No - Comment as needed  Activities of Daily Living Home Assistive Devices/Equipment: None ADL Screening (condition at time of admission) Patient's cognitive ability  adequate to safely complete daily activities?: Yes Is the patient deaf or have difficulty hearing?: No Does the patient have difficulty seeing, even when wearing glasses/contacts?: No Does the patient have difficulty concentrating, remembering, or making decisions?: No Patient able to express need for assistance with ADLs?: No Does the patient have difficulty dressing or bathing?: No Independently performs ADLs?: Yes (appropriate for developmental age) Does the patient have difficulty walking or climbing stairs?: No Weakness of Legs: Both Weakness of Arms/Hands: Both  Permission Sought/Granted Permission sought to share information with : Family Supports Permission granted to share information with : Yes, Verbal Permission Granted  Share Information with NAME: Bernardino, Dowell (Spouse)   [phone] (Mobile)           Emotional Assessment Appearance:: Appears stated age Attitude/Demeanor/Rapport: Engaged Affect (typically observed): Stable Orientation: : Oriented to Self, Oriented to Place, Oriented to Situation, Oriented to  Time Alcohol / Substance Use: Not Applicable Psych Involvement: No (comment)  Admission diagnosis:  Hyponatremia [E87.1] Weakness [R53.1] Patient Active Problem List   Diagnosis Date Noted   Protein-calorie malnutrition, severe 06/04/2021   SIADH (syndrome of inappropriate ADH production) ([place]) 05/23/2021   Acute hyponatremia 05/21/2021   Hyponatremia 05/20/2021   Metastatic cancer to brain ([place]) 09/05/2020   Port-A-Cath in place 06/03/2020   Emphysema of lung ([place]) 04/26/2020   Small cell lung cancer, left upper lobe ([place]) 04/18/2020   Encounter for antineoplastic chemotherapy 04/18/2020   Encounter for antineoplastic immunotherapy 04/18/2020   Goals  of care, counseling/discussion 04/18/2020   Syncope 04/11/2020   Lung mass 04/11/2020   History of prostate cancer 04/11/2020   Chest pain 04/03/2020   Vitamin D deficiency 12/15/2019   Dysphagia  07/17/2017   HTN (hypertension) 08/20/2016   Malignant neoplasm of prostate ([place]) 11/29/2014   Bee sting allergy 07/31/2014   Elevated PSA 04/14/2013   Lumbar disc disease 04/14/2013   Personal history of colonic adenoma 04/26/2012   Impaired glucose tolerance 02/27/2012   Encounter for well adult exam with abnormal findings 02/27/2012   Chronic low back pain    Alcohol abuse 12/31/2010   Functional Dyspepsia 03/11/2009   Hyperlipidemia 01/16/2008   Anxiety state 01/16/2008   ERECTILE DYSFUNCTION 01/16/2008   Tobacco abuse 01/16/2008   ALLERGIC RHINITIS 01/16/2008   GERD 01/16/2008   PCP:  [name], MD Pharmacy:   [id number] [place], [address] [place] [address] Phone: [phone] Fax: [phone]     Social Determinants of Health (SDOH) Interventions    Readmission Risk Interventions No flowsheet data found.

## 2021-06-20 NOTE — Progress Notes (Signed)
[facility]  ROUNDING NOTE   Subjective:   Richard Santos is a 60 y.o. 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 He was admitted to [facility] for Hyponatremia [E87.1] Weakness [R53.1]   We have been consulted to evaluate and assist with managing severely low sodium levels. Patient is known to this practice from a recent admission for same concern.   Patient seen resting in bed States he feels well  No complaints at this time  Na 134  Objective:  Vital signs in last 24 hours:  Temp:  [97.8 F (36.6 C)-98.2 F (36.8 C)] 98.2 F (36.8 C) (07/29 0720) Pulse Rate:  [91-92] 91 (07/29 0720) Resp:  [17-22] 17 (07/29 0909) BP: (107-123)/(73-82) 115/77 (07/29 0909) SpO2:  [98 %] 98 % (07/29 0720)  Weight change:  Filed Weights   06/17/21 1339 06/17/21 2200 06/18/21 0500  Weight: 67.1 kg 66.1 kg 66.9 kg    Intake/Output: I/O last 3 completed shifts: In: 1178.5 [P.O.:600; I.V.:578.5] Out: 3600 [Urine:3600]   Intake/Output this shift:  Total I/O In: 240 [P.O.:240] Out: 200 [Urine:200]  Physical Exam: General: NAD, resting in bed  Head: Normocephalic, atraumatic. Moist oral mucosal membranes  Eyes: Anicteric  Lungs:  Clear to auscultation, normal breathing effort  Heart: Regular rate and rhythm  Abdomen:  Soft, nontender  Extremities:  no peripheral edema.  Neurologic: Alert, moving all four extremities  Skin: No lesions       Basic Metabolic Panel: Recent Labs  Lab 06/18/21 1030 06/18/21 1745 06/18/21 1956 06/19/21 0138 06/19/21 0652 06/19/21 1323 06/19/21 2215 06/20/21 0430  NA 119* 120* 121* 122* 124* 125* 133* 134*  K 3.6 3.6 3.3* 3.3*  --   --   --  3.8  CL 86* 91* 90* 93*  --   --   --  101  CO2 23 23 22 23   --   --   --  26  GLUCOSE 98 95 142* 88  --   --   --  99  BUN 5* 5* <5* 5*  --   --   --  <5*  CREATININE 0.47* 0.56* 0.56*  0.39*  --   --   --  0.50*  CALCIUM 8.4* 8.5* 8.6* 8.2*  --   --   --  9.4  MG  --   --   --   --   --   --   --  1.6*  PHOS  --   --   --   --   --   --   --  4.1     Liver Function Tests: Recent Labs  Lab 06/17/21 1105 06/19/21 1323  AST 37 31  ALT 23 22  ALKPHOS 84 62  BILITOT 0.5 0.4  PROT 7.1 6.2*  ALBUMIN 3.2* 3.0*    No results for input(s): LIPASE, AMYLASE in the last 168 hours. No results for input(s): AMMONIA in the last 168 hours.  CBC: Recent Labs  Lab 06/17/21 1105 06/17/21 2013 06/18/21 0650 06/19/21 0138 06/20/21 0430  WBC 6.1 4.5 5.4 4.0 3.5*  NEUTROABS 4.9  --   --   --   --   HGB 11.4* 10.5* 10.8* 9.3* 10.8*  HCT 30.4* RESULTS UNAVAILABLE DUE TO INTERFERING SUBSTANCE 28.9* 25.0* 29.7*  MCV 93.0 RESULTS UNAVAILABLE DUE TO INTERFERING SUBSTANCE 94.1 96.2 98.0  PLT 251 226 217 188 267  Cardiac Enzymes: No results for input(s): CKTOTAL, CKMB, CKMBINDEX, TROPONINI in the last 168 hours.  BNP: Invalid input(s): POCBNP  CBG: Recent Labs  Lab 06/17/21 2159  GLUCAP 93     Microbiology: Results for orders placed or performed during the hospital encounter of 06/17/21  Resp Panel by RT-PCR (Flu A&B, Covid) Nasopharyngeal Swab     Status: None   Collection Time: 06/17/21  3:20 PM   Specimen: Nasopharyngeal Swab; Nasopharyngeal(NP) swabs in vial transport medium  Result Value Ref Range Status   SARS Coronavirus 2 by RT PCR NEGATIVE NEGATIVE Final    Comment: (NOTE) SARS-CoV-2 target nucleic acids are NOT DETECTED.  The SARS-CoV-2 RNA is generally detectable in upper respiratory specimens during the acute phase of infection. The lowest concentration of SARS-CoV-2 viral copies this assay can detect is 138 copies/mL. A negative result does not preclude SARS-Cov-2 infection and should not be used as the sole basis for treatment or other patient management decisions. A negative result may occur with  improper specimen collection/handling,  submission of specimen other than nasopharyngeal swab, presence of viral mutation(s) within the areas targeted by this assay, and inadequate number of viral copies(<138 copies/mL). A negative result must be combined with clinical observations, patient history, and epidemiological information. The expected result is Negative.  Fact Sheet for Patients:  EntrepreneurPulse.com.au  Fact Sheet for Healthcare Providers:  IncredibleEmployment.be  This test is no t yet approved or cleared by the [place] FDA and  has been authorized for detection and/or diagnosis of SARS-CoV-2 by FDA under an Emergency Use Authorization (EUA). This EUA will remain  in effect (meaning this test can be used) for the duration of the COVID-19 declaration under Section 564(b)(1) of the Act, 21 U.S.C.section 360bbb-3(b)(1), unless the authorization is terminated  or revoked sooner.       Influenza A by PCR NEGATIVE NEGATIVE Final   Influenza B by PCR NEGATIVE NEGATIVE Final    Comment: (NOTE) The Xpert Xpress SARS-CoV-2/FLU/RSV plus assay is intended as an aid in the diagnosis of influenza from Nasopharyngeal swab specimens and should not be used as a sole basis for treatment. Nasal washings and aspirates are unacceptable for Xpert Xpress SARS-CoV-2/FLU/RSV testing.  Fact Sheet for Patients: EntrepreneurPulse.com.au  Fact Sheet for Healthcare Providers: IncredibleEmployment.be  This test is not yet approved or cleared by the [place] FDA and has been authorized for detection and/or diagnosis of SARS-CoV-2 by FDA under an Emergency Use Authorization (EUA). This EUA will remain in effect (meaning this test can be used) for the duration of the COVID-19 declaration under Section 564(b)(1) of the Act, 21 U.S.C. section 360bbb-3(b)(1), unless the authorization is terminated or revoked.  Performed at [facility], [address] [place], [place] [address]     Coagulation Studies: No results for input(s): LABPROT, INR in the last 72 hours.  Urinalysis: No results for input(s): COLORURINE, LABSPEC, PHURINE, GLUCOSEU, HGBUR, BILIRUBINUR, KETONESUR, PROTEINUR, UROBILINOGEN, NITRITE, LEUKOCYTESUR in the last 72 hours.  Invalid input(s): APPERANCEUR    Imaging: No results found.   Medications:      aspirin EC  81 mg Oral Daily   Budeson-Glycopyrrol-Formoterol  2 puff Inhalation BID   Chlorhexidine Gluconate Cloth  6 each Topical Daily   dronabinol  2.5 mg Oral BID AC   enoxaparin (LOVENOX) injection  40 mg Subcutaneous Q24H   famotidine  20 mg Oral BID   feeding supplement  1 Container Oral TID BM   multivitamin with minerals  1  tablet Oral Daily   pantoprazole  40 mg Oral q morning   sodium chloride flush  3 mL Intravenous Q12H   acetaminophen **OR** acetaminophen, albuterol, HYDROcodone-acetaminophen, loperamide, ondansetron **OR** ondansetron (ZOFRAN) IV, polyethylene glycol, prochlorperazine  Assessment/ Plan:  Mr. ASHIR KUNZ is a 60 y.o.  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 He was admitted to [facility] for  Hyponatremia likely caused by SIADH vs adrenal gland dysfunction related to cancer metastasis. Admission level 115 During previous admission, sodium stabilized around 125-127 at discharge with Tolvaptan. Patient admitted to ICU and placed on 3% NS. Tolvaptan given once yesterday. Sodium currently 134. Discussed fluid restriction with patient. Recommend Sodium tabs 1G twice a day and Lasix 20mg  po daily to help manage sodium levels. Would like to follow up with patient at discharge to monitor these levels.   2. Hypertension: Stable     LOS: 3   7/29/20221:59 PM

## 2021-06-20 NOTE — Telephone Encounter (Signed)
Scheduled appts per 7/29 sch msg. Pt is aware.

## 2021-06-20 NOTE — Progress Notes (Signed)
Pt discharged. Wife and pt instructed on fluid restriction, upcoming appointments, and new medications. No further questions and issues verbalized.

## 2021-06-20 NOTE — Evaluation (Signed)
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 Patient was recently hospitalized for same and upon discharge on 06/05/21 his serum sodium was 127. This adm:  put on 3% saline drip and then this was d/c and pt was given one dose of tolvaptan as per nephro and serum Na up to 134 on date of therapy eval.   Clinical Impression  Patient alert, agreeable to PT. Reported at baseline he is independent.   The patient performed bed mobility and transfers independently. He was able to ambulate independently as well, ~216ft with no AD, CGA  for assessment of higher level balance activities. He also ascended/descended a full flight of stiars, of note his HR elevated to 150. Seated rest break after stairs, HR returned to 120s. Patient demonstrated near return to baseline level of functioning, but would benefit from further skilled PT maximize safety, function, and decrease risk of falls.      Follow Up Recommendations Outpatient PT;Home health PT    Equipment Recommendations  None recommended by PT    Recommendations for Other Services       Precautions / Restrictions Precautions Precautions: Fall Restrictions Weight Bearing Restrictions: No      Mobility  Bed Mobility Overal bed mobility: Independent                  Transfers Overall transfer level: Independent               General transfer comment: increased time, no physical assist, good lift off  Ambulation/Gait Ambulation/Gait assistance: Supervision;Independent Gait Distance (Feet): 160  Feet Assistive device: None       General Gait Details: no unsteadiness noted with normal gait  Stairs Stairs: Yes Stairs assistance: Min guard Stair Management: One rail Right Number of Stairs: 14    Wheelchair Mobility    Modified Rankin (Stroke Patients Only)       Balance Overall balance assessment: Needs assistance   Sitting balance-Leahy Scale: Normal       Standing balance-Leahy Scale: Normal               High level balance activites: Direction changes;Turns;Sudden stops;Head turns High Level Balance Comments: some deficits noted, no real LOB. CGA throughout for safety             Pertinent Vitals/Pain Pain Assessment: No/denies pain    Home Living Family/patient expects to be discharged to:: Private residence Living Arrangements: Spouse/significant other Available Help at Discharge: Family;Available PRN/intermittently Type of Home: House Home Access: Stairs to enter Entrance Stairs-Rails: Right Entrance Stairs-Number of Steps: 3 Home Layout: Two level;Able to live on main level with bedroom/bathroom Home Equipment: None      Prior Function Level of Independence: Independent         Comments: driving and working in the yard, does endorse that getting up from the garden has been getting more difficult and that he has started avoiding going to second floor of the home.     Hand Dominance        Extremity/Trunk Assessment   Upper Extremity Assessment Upper Extremity Assessment: Defer  to OT evaluation    Lower Extremity Assessment Lower Extremity Assessment: Overall WFL for tasks assessed (grossly at least 4+/5)    Cervical / Trunk Assessment Cervical / Trunk Assessment: Normal  Communication   Communication: No difficulties  Cognition Arousal/Alertness: Awake/alert Behavior During Therapy: WFL for tasks assessed/performed Overall Cognitive Status: Within Functional Limits for tasks assessed                                         General Comments General comments (skin integrity, edema, etc.): noted to have some small periods of retropulsion or posterior sway, he is able to self correct and does not require physical assitance or AD    Exercises Other Exercises Other Exercises: OT ed re: role of OT in acute setting.   Assessment/Plan    PT Assessment Patient needs continued PT services  PT Problem List Decreased mobility;Decreased balance;Decreased activity tolerance       PT Treatment Interventions DME instruction;Therapeutic exercise;Balance training;Gait training;Stair training;Neuromuscular re-education;Therapeutic activities;Patient/family education;Functional mobility training    PT Goals (Current goals can be found in the Care Plan section)  Acute Rehab PT Goals Patient Stated Goal: to go home    Frequency Min 2X/week   Barriers to discharge        Co-evaluation               AM-PAC PT "6 Clicks" Mobility  Outcome Measure Help needed turning from your back to your side while in a flat bed without using bedrails?: None Help needed moving from lying on your back to sitting on the side of a flat bed without using bedrails?: None Help needed moving to and from a bed to a chair (including a wheelchair)?: None Help needed standing up from a chair using your arms (e.g., wheelchair or bedside chair)?: None Help needed to walk in hospital room?: None Help needed climbing 3-5 steps with a railing? : None 6 Click Score: 24    End of Session Equipment Utilized During Treatment: Gait belt Activity Tolerance: Patient tolerated treatment well Patient left: in bed;with call bell/phone within reach Nurse Communication: Mobility status PT Visit Diagnosis: Other abnormalities of gait and mobility (R26.89)    Time: 9628-3662 PT Time Calculation (min) (ACUTE ONLY): 15 min   Charges:   PT Evaluation $PT Eval Low Complexity: 1 Low        Lieutenant Diego PT, DPT 2:54  PM,06/20/21

## 2021-06-20 NOTE — Progress Notes (Signed)
DISCONTINUE ON PATHWAY REGIMEN - Small Cell Lung     Cycles 1 through 4: A cycle is every 21 days:     Durvalumab      Carboplatin      Etoposide    Cycles 5 and beyond: A cycle is every 28 days:     Durvalumab   **Always confirm dose/schedule in your pharmacy ordering system**  REASON: Disease Progression PRIOR TREATMENT: LOS420: Durvalumab 1,500 mg D1 + Carboplatin AUC=5 D1 + Etoposide 100 mg/m2 D1-3 q21 Days x 4 Cycles, Followed by Durvalumab 1,500 mg q28 Days Until Progression or Unacceptable Toxicity TREATMENT RESPONSE: Partial Response (PR)  START ON PATHWAY REGIMEN - Small Cell Lung     A cycle is every 21 days:     Carboplatin      Etoposide   **Always confirm dose/schedule in your pharmacy ordering system**  Patient Characteristics: Relapsed or Progressive Disease, Second Line, Relapse > 6 Months Therapeutic Status: Relapsed or Progressive Disease Line of Therapy: Second Line Time to Relapse: Relapse > 6 Months Intent of Therapy: Non-Curative / Palliative Intent, Discussed with Patient

## 2021-06-20 NOTE — Telephone Encounter (Signed)
I called the patient and his wife to let them know that Dr. Julien Nordmann is planning on restarting chemotherapy if the patient is agreeable. Scheduling made the appointment for Tuesday 06/24/21. I let them know that these appointments were made and we will see him that day to discuss this further. They expressed understanding.

## 2021-06-20 NOTE — Discharge Summary (Signed)
Physician Discharge Summary  Richard Santos ZGY:[id number] DOB: 07/27/1961 DOA: 06/17/2021  PCP: [name], MD  Admit date: 06/17/2021 Discharge date: 06/20/2021  Admitted From: home  Disposition:  home  Recommendations for Outpatient Follow-up:  Follow up with PCP in 1-2 weeks F/u w/ nephro, Dr. Holley Raring, in 1-2 weeks  Home Health: no Equipment/Devices:  Discharge Condition: stable CODE STATUS:full  Diet recommendation:  Regular w/ 1200 mL fluid restriction   Brief/Interim Summary: HPI was taken from Dr. Marcello Moores: Richard Santos is a 60 y.o. male with medical history significant of  60 y.o. 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 Patient during that hospitalization was treated with  3% saline since admission at a rate of 25 cc/h with goal of NA greater than 125. Patient was then transitioned to fluid restriction and salt tablets and was given one dose of tolvaptan . In addition his  Demeclocycline was held. Patient now return to ed from oncology office after lab draw today noted NA of 115. Patient denies any sz activity, syncope, nausea, confusion ,HA, or weakness but does note increase fatigue from baseline. He denies any excessive fluid intake, etoh use and noted he has been compliant with his medical regimen  Hospital course from Dr. Jimmye Norman 7/27-7/29/22: Pt presented w/ acute on chronic hyponatremia in setting of SIADH likely secondary to hx of small cell lung cancer. Pt was initially put on 3% saline drip and then this was d/c and pt was given one dose of tolvaptan as per nephro. Pt was d/c w/ NaCl  1 gm BID, lasix 20mg  daily and 1241mL fluid restriction as per nephro recs. Pt's Na level was 134 on the day of discharge. For more information, please see previous progress/consult notes.   Discharge Diagnoses:  Active Problems:   Hyponatremia  Acute on chronic hyponatremia: in setting of SIADH secondary to hx of lung CA.  D/c 3% NaCl and s/p tolvaptan x1 as per nephro. Na level is 134 on the day of d/c and pt will be d/c home w/ NaCl 1g BID, lasix 20mg  daily & fluid restriction on 1200 mL daily as per nephro. F/u w/ nephro in 1-2 weeks    Small cell lung cancer: management per onco as an outpatient   Normocytic anemia: H&H are labile. Will transfuse if Hb < 7.0   COPD: w/o exacerbation. Continue on bronchodilators    GERD: continue on pantoprazole    Discharge Instructions  Discharge Instructions     Diet general   Complete by: As directed    W/ 1200 mL daily fluid restriction   Discharge instructions   Complete by: As directed    F/u w/ nephro, Dr. Holley Raring, in 1 week. F/u w/ PCP in 1-2 weeks. You need to fluid restriction 1200 mL daily as per nephrology.   Increase activity slowly   Complete by: As directed       Allergies as of 06/20/2021       Reactions   Bee Venom Swelling   Elemental Sulfur Other (See Comments)   Acute renal failure   Other Other (See Comments)   Chemo medicine- patient not sure about  name - swelling & rashes   Sulfa Antibiotics    Namenda [memantine] Nausea Only   Nausea and fatigue        Medication List     TAKE these medications    acetaminophen 325 MG tablet Commonly known as: TYLENOL Take 2 tablets (650 mg total) by mouth every 6 (six) hours as needed for mild pain (or Fever >/= 101).   aspirin EC 81 MG tablet Take 81 mg by mouth daily. Swallow whole.   Breztri Aerosphere 160-9-4.8 MCG/ACT Aero Generic drug: Budeson-Glycopyrrol-Formoterol INHALE 2 PUFFS INTO THE LUNGS IN THE MORNING AND AT BEDTIME.   famotidine 20 MG  tablet Commonly known as: Pepcid Take 1 tablet (20 mg total) by mouth 2 (two) times daily.   furosemide 20 MG tablet Commonly known as: Lasix Take 1 tablet (20 mg total) by mouth daily.   multivitamin capsule Take 1 capsule by mouth daily.   ondansetron 8 MG disintegrating tablet Commonly known as: ZOFRAN-ODT Take 1 tablet (8 mg total) by mouth every 8 (eight) hours as needed for nausea or vomiting.   pantoprazole 40 MG tablet Commonly known as: PROTONIX Take 40 mg by mouth every morning.   prochlorperazine 10 MG tablet Commonly known as: COMPAZINE Take 1 tablet (10 mg total) by mouth every 6 (six) hours as needed.   sodium chloride 1 g tablet Take 1 tablet (1 g total) by mouth 2 (two) times daily with a meal.   traMADol 50 MG tablet Commonly known as: ULTRAM TAKE 1/2 TABLET BY MOUTH TWICE A DAY AS NEEDED FOR PAIN        Follow-up Information     Lateef, Munsoor, MD Follow up in 1 week(s).   Specialty: Nephrology Contact information: [place] [address] [phone]         [name], MD Follow up in 1 week(s).   Specialties: Internal Medicine, Radiology Contact information: [place] [address] [phone]                Allergies  Allergen Reactions   Bee Venom Swelling   Elemental Sulfur Other (See Comments)    Acute renal failure   Other Other (See Comments)    Chemo medicine- patient not sure about name - swelling & rashes   Sulfa Antibiotics    Namenda [Memantine] Nausea Only    Nausea and fatigue    Consultations: Nephro    Procedures/Studies: CT CHEST W CONTRAST  Result Date: 05/22/2021 CLINICAL DATA:  60 year old male with small cell lung cancer restaging. EXAM: CT CHEST, ABDOMEN, AND PELVIS WITH CONTRAST TECHNIQUE: Multidetector CT imaging of the chest, abdomen and pelvis was performed following the standard protocol during bolus administration of intravenous contrast. CONTRAST:   149mL OMNIPAQUE IOHEXOL 300 MG/ML  SOLN COMPARISON:  CT dated 04/18/2021. FINDINGS: CT CHEST FINDINGS Cardiovascular: There is no cardiomegaly or pericardial effusion. There is coronary vascular calcification. Right-sided Port-A-Cath with tip at the cavoatrial junction. The thoracic aorta is unremarkable. The origins of the great vessels of the aortic arch appear patent. The central pulmonary arteries are unremarkable. Mediastinum/Nodes: No hilar or mediastinal adenopathy. The esophagus and the thyroid gland are grossly unremarkable. No mediastinal fluid collection. Lungs/Pleura: Post radiation changes of the left lung and left perihilar region with traction bronchiectasis and architectural distortion. Slight interval decrease in the size of the cavitary lesion in the lingula measuring up to 3.3 cm. There is a 7 x 8 mm nodular  density in the medial left upper lobe (45/4) which has increased in size since the prior CT (previously measuring approximately 4 mm). A lobulated and somewhat cavitary lesion in the left upper lobe measures 12 x 18 mm similar to prior CT. Similar appearance of subpleural thickening and scarring along the posterior aspect of the superior segment of the left lower lobe. Biapical subpleural scarring. There is a 3 mm subpleural nodule in the anterior right upper lobe (50/4), increased in size since the prior CT. There is no pleural effusion pneumothorax. The central airways are patent. Musculoskeletal: No axillary adenopathy. Similar appearance of a 4 mm nodule in the subcutaneous soft tissues of the right lateral chest wall (34/2). A 1.2 x 1.3 cm nodular density in the subcutaneous soft tissues of the left anterior chest wall (34/2) has increased in size (previously measuring approximately 6 x 8 mm). There is degenerative changes of the spine. Stable 5 mm sclerotic lesion in the T8, indeterminate. No acute osseous pathology. CT ABDOMEN PELVIS FINDINGS No intra-abdominal free air or free fluid.  Hepatobiliary: No focal liver abnormality is seen. No gallstones, gallbladder wall thickening, or biliary dilatation. Pancreas: Unremarkable. No pancreatic ductal dilatation or surrounding inflammatory changes. Spleen: Normal in size without focal abnormality. Adrenals/Urinary Tract: The right adrenal gland is unremarkable. Lobulated mass involving the left adrenal gland measures 4.2 x 2.8 cm (61/2), not significantly changed. There is a 2 cm right renal interpolar cyst. There is no hydronephrosis on either side. There is symmetric enhancement and excretion of contrast by both kidneys. The visualized ureters appear unremarkable. There is irregular and thickened appearance of the posterior bladder wall which is not evaluated on this CT but may be related to enlargement of the prostate gland and changes of TURP. Cystoscopy may provide better evaluation. Stomach/Bowel: There is sigmoid diverticulosis without active inflammatory changes. There is no bowel obstruction or active inflammation. The appendix is normal. Vascular/Lymphatic: Advanced aortoiliac atherosclerotic disease. The IVC is unremarkable. No portal venous gas. There is no adenopathy. Reproductive: Enlarged prostate gland with brachytherapy seeds . Other: None Musculoskeletal: Osteopenia with degenerative changes of the spine. No acute osseous pathology. IMPRESSION: 1. Post radiation changes of the left lung and left perihilar region with traction bronchiectasis and architectural distortion. Slight interval decrease in the size of the cavitary lesion in the lingula. 2. Interval increase in the size of pulmonary nodule in the medial left upper lobe. Additionally a subcutaneous nodule in the left anterior chest wall has increased since the prior CT. 3. No significant interval change in the size of the left adrenal mass. 4. Sigmoid diverticulosis. No bowel obstruction. Normal appendix. 5. Aortic Atherosclerosis (ICD10-I70.0). Electronically Signed   By: Anner Crete M.D.   On: 05/22/2021 19:20   CT ABDOMEN PELVIS W CONTRAST  Result Date: 05/22/2021 CLINICAL DATA:  60 year old male with small cell lung cancer restaging. EXAM: CT CHEST, ABDOMEN, AND PELVIS WITH CONTRAST TECHNIQUE: Multidetector CT imaging of the chest, abdomen and pelvis was performed following the standard protocol during bolus administration of intravenous contrast. CONTRAST:  111mL OMNIPAQUE IOHEXOL 300 MG/ML  SOLN COMPARISON:  CT dated 04/18/2021. FINDINGS: CT CHEST FINDINGS Cardiovascular: There is no cardiomegaly or pericardial effusion. There is coronary vascular calcification. Right-sided Port-A-Cath with tip at the cavoatrial junction. The thoracic aorta is unremarkable. The origins of the great vessels of the aortic arch appear patent. The central pulmonary arteries are unremarkable. Mediastinum/Nodes: No hilar or mediastinal adenopathy. The esophagus and the thyroid gland are grossly unremarkable. No  mediastinal fluid collection. Lungs/Pleura: Post radiation changes of the left lung and left perihilar region with traction bronchiectasis and architectural distortion. Slight interval decrease in the size of the cavitary lesion in the lingula measuring up to 3.3 cm. There is a 7 x 8 mm nodular density in the medial left upper lobe (45/4) which has increased in size since the prior CT (previously measuring approximately 4 mm). A lobulated and somewhat cavitary lesion in the left upper lobe measures 12 x 18 mm similar to prior CT. Similar appearance of subpleural thickening and scarring along the posterior aspect of the superior segment of the left lower lobe. Biapical subpleural scarring. There is a 3 mm subpleural nodule in the anterior right upper lobe (50/4), increased in size since the prior CT. There is no pleural effusion pneumothorax. The central airways are patent. Musculoskeletal: No axillary adenopathy. Similar appearance of a 4 mm nodule in the subcutaneous soft tissues of the  right lateral chest wall (34/2). A 1.2 x 1.3 cm nodular density in the subcutaneous soft tissues of the left anterior chest wall (34/2) has increased in size (previously measuring approximately 6 x 8 mm). There is degenerative changes of the spine. Stable 5 mm sclerotic lesion in the T8, indeterminate. No acute osseous pathology. CT ABDOMEN PELVIS FINDINGS No intra-abdominal free air or free fluid. Hepatobiliary: No focal liver abnormality is seen. No gallstones, gallbladder wall thickening, or biliary dilatation. Pancreas: Unremarkable. No pancreatic ductal dilatation or surrounding inflammatory changes. Spleen: Normal in size without focal abnormality. Adrenals/Urinary Tract: The right adrenal gland is unremarkable. Lobulated mass involving the left adrenal gland measures 4.2 x 2.8 cm (61/2), not significantly changed. There is a 2 cm right renal interpolar cyst. There is no hydronephrosis on either side. There is symmetric enhancement and excretion of contrast by both kidneys. The visualized ureters appear unremarkable. There is irregular and thickened appearance of the posterior bladder wall which is not evaluated on this CT but may be related to enlargement of the prostate gland and changes of TURP. Cystoscopy may provide better evaluation. Stomach/Bowel: There is sigmoid diverticulosis without active inflammatory changes. There is no bowel obstruction or active inflammation. The appendix is normal. Vascular/Lymphatic: Advanced aortoiliac atherosclerotic disease. The IVC is unremarkable. No portal venous gas. There is no adenopathy. Reproductive: Enlarged prostate gland with brachytherapy seeds . Other: None Musculoskeletal: Osteopenia with degenerative changes of the spine. No acute osseous pathology. IMPRESSION: 1. Post radiation changes of the left lung and left perihilar region with traction bronchiectasis and architectural distortion. Slight interval decrease in the size of the cavitary lesion in the  lingula. 2. Interval increase in the size of pulmonary nodule in the medial left upper lobe. Additionally a subcutaneous nodule in the left anterior chest wall has increased since the prior CT. 3. No significant interval change in the size of the left adrenal mass. 4. Sigmoid diverticulosis. No bowel obstruction. Normal appendix. 5. Aortic Atherosclerosis (ICD10-I70.0). Electronically Signed   By: Anner Crete M.D.   On: 05/22/2021 19:20   (Echo, Carotid, EGD, Colonoscopy, ERCP)    Subjective: Pt denies any complaints and is ready to go home    Discharge Exam: Vitals:   06/20/21 0720 06/20/21 0909  BP: 117/82 115/77  Pulse: 91   Resp: 19 17  Temp: 98.2 F (36.8 C)   SpO2: 98%    Vitals:   06/19/21 2200 06/20/21 0400 06/20/21 0720 06/20/21 0909  BP: 112/76 123/81 117/82 115/77  Pulse:   91   Resp:  19 (!) 22 19 17   Temp:  97.8 F (36.6 C) 98.2 F (36.8 C)   TempSrc:  Oral Oral   SpO2:   98%   Weight:      Height:        General: Pt is alert, awake, not in acute distress Cardiovascular: S1/S2 +, no rubs, no gallops Respiratory: diminished breath sounds b/l Abdominal: Soft, NT, ND, bowel sounds + Extremities: no cyanosis    The results of significant diagnostics from this hospitalization (including imaging, microbiology, ancillary and laboratory) are listed below for reference.     Microbiology: Recent Results (from the past 240 hour(s))  Resp Panel by RT-PCR (Flu A&B, Covid) Nasopharyngeal Swab     Status: None   Collection Time: 06/17/21  3:20 PM   Specimen: Nasopharyngeal Swab; Nasopharyngeal(NP) swabs in vial transport medium  Result Value Ref Range Status   SARS Coronavirus 2 by RT PCR NEGATIVE NEGATIVE Final    Comment: (NOTE) SARS-CoV-2 target nucleic acids are NOT DETECTED.  The SARS-CoV-2 RNA is generally detectable in upper respiratory specimens during the acute phase of infection. The lowest concentration of SARS-CoV-2 viral copies this assay can  detect is 138 copies/mL. A negative result does not preclude SARS-Cov-2 infection and should not be used as the sole basis for treatment or other patient management decisions. A negative result may occur with  improper specimen collection/handling, submission of specimen other than nasopharyngeal swab, presence of viral mutation(s) within the areas targeted by this assay, and inadequate number of viral copies(<138 copies/mL). A negative result must be combined with clinical observations, patient history, and epidemiological information. The expected result is Negative.  Fact Sheet for Patients:  EntrepreneurPulse.com.au  Fact Sheet for Healthcare Providers:  IncredibleEmployment.be  This test is no t yet approved or cleared by the [place] FDA and  has been authorized for detection and/or diagnosis of SARS-CoV-2 by FDA under an Emergency Use Authorization (EUA). This EUA will remain  in effect (meaning this test can be used) for the duration of the COVID-19 declaration under Section 564(b)(1) of the Act, 21 U.S.C.section 360bbb-3(b)(1), unless the authorization is terminated  or revoked sooner.       Influenza A by PCR NEGATIVE NEGATIVE Final   Influenza B by PCR NEGATIVE NEGATIVE Final    Comment: (NOTE) The Xpert Xpress SARS-CoV-2/FLU/RSV plus assay is intended as an aid in the diagnosis of influenza from Nasopharyngeal swab specimens and should not be used as a sole basis for treatment. Nasal washings and aspirates are unacceptable for Xpert Xpress SARS-CoV-2/FLU/RSV testing.  Fact Sheet for Patients: EntrepreneurPulse.com.au  Fact Sheet for Healthcare Providers: IncredibleEmployment.be  This test is not yet approved or cleared by the [place] FDA and has been authorized for detection and/or diagnosis of SARS-CoV-2 by FDA under an Emergency Use Authorization (EUA). This EUA will remain in  effect (meaning this test can be used) for the duration of the COVID-19 declaration under Section 564(b)(1) of the Act, 21 U.S.C. section 360bbb-3(b)(1), unless the authorization is terminated or revoked.  Performed at [facility], [address] [place],  [address]      Labs: BNP (last 3 results) No results for input(s): BNP in the last 8760 hours. Basic Metabolic Panel: Recent Labs  Lab 06/18/21 1030 06/18/21 1745 06/18/21 1956 06/19/21 0138 06/19/21 0652 06/19/21 1323 06/19/21 2215 06/20/21 0430  NA 119* 120* 121* 122* 124* 125* 133* 134*  K 3.6 3.6 3.3* 3.3*  --   --   --  3.8  CL 86* 91* 90* 93*  --   --   --  101  CO2 23 23 22 23   --   --   --  26  GLUCOSE 98 95 142* 88  --   --   --  99  BUN 5* 5* <5* 5*  --   --   --  <5*  CREATININE 0.47* 0.56* 0.56* 0.39*  --   --   --  0.50*  CALCIUM 8.4* 8.5* 8.6* 8.2*  --   --   --  9.4  MG  --   --   --   --   --   --   --  1.6*  PHOS  --   --   --   --   --   --   --  4.1   Liver Function Tests: Recent Labs  Lab 06/17/21 1105 06/19/21 1323  AST 37 31  ALT 23 22  ALKPHOS 84 62  BILITOT 0.5 0.4  PROT 7.1 6.2*  ALBUMIN 3.2* 3.0*   No results for input(s): LIPASE, AMYLASE in the last 168 hours. No results for input(s): AMMONIA in the last 168 hours. CBC: Recent Labs  Lab 06/17/21 1105 06/17/21 2013 06/18/21 0650 06/19/21 0138 06/20/21 0430  WBC 6.1 4.5 5.4 4.0 3.5*  NEUTROABS 4.9  --   --   --   --   HGB 11.4* 10.5* 10.8* 9.3* 10.8*  HCT 30.4* RESULTS UNAVAILABLE DUE TO INTERFERING SUBSTANCE 28.9* 25.0* 29.7*  MCV 93.0 RESULTS UNAVAILABLE DUE TO INTERFERING SUBSTANCE 94.1 96.2 98.0  PLT 251 226 217 188 267   Cardiac Enzymes: No results for input(s): CKTOTAL, CKMB, CKMBINDEX, TROPONINI in the last 168 hours. BNP: Invalid input(s): POCBNP CBG: Recent Labs  Lab 06/17/21 2159  GLUCAP 93   D-Dimer No results for input(s): DDIMER in the last 72 hours. Hgb A1c No results for input(s):  HGBA1C in the last 72 hours. Lipid Profile No results for input(s): CHOL, HDL, LDLCALC, TRIG, CHOLHDL, LDLDIRECT in the last 72 hours. Thyroid function studies Recent Labs    06/17/21 2013  TSH 1.083   Anemia work up No results for input(s): VITAMINB12, FOLATE, FERRITIN, TIBC, IRON, RETICCTPCT in the last 72 hours. Urinalysis    Component Value Date/Time   COLORURINE YELLOW (A) 06/03/2021 1423   APPEARANCEUR CLEAR (A) 06/03/2021 1423   LABSPEC 1.011 06/03/2021 1423   PHURINE 6.0 06/03/2021 1423   GLUCOSEU 50 (A) 06/03/2021 1423   GLUCOSEU NEGATIVE 12/12/2019 1506   HGBUR NEGATIVE 06/03/2021 [place] 06/03/2021 1423   KETONESUR NEGATIVE 06/03/2021 1423   PROTEINUR NEGATIVE 06/03/2021 1423   UROBILINOGEN 0.2 12/12/2019 1506   NITRITE NEGATIVE 06/03/2021 1423   LEUKOCYTESUR NEGATIVE 06/03/2021 1423   Sepsis Labs Invalid input(s): PROCALCITONIN,  WBC,  LACTICIDVEN Microbiology Recent Results (from the past 240 hour(s))  Resp Panel by RT-PCR (Flu A&B, Covid) Nasopharyngeal Swab     Status: None   Collection Time: 06/17/21  3:20 PM   Specimen: Nasopharyngeal Swab; Nasopharyngeal(NP) swabs in vial transport medium  Result Value Ref Range Status   SARS Coronavirus 2 by RT PCR NEGATIVE NEGATIVE Final    Comment: (NOTE) SARS-CoV-2 target nucleic acids are NOT DETECTED.  The SARS-CoV-2 RNA is generally detectable in upper respiratory specimens during the acute phase of infection. The lowest concentration of SARS-CoV-2 viral copies this assay can detect is 138 copies/mL. A negative result does not preclude SARS-Cov-2 infection and should not  be used as the sole basis for treatment or other patient management decisions. A negative result may occur with  improper specimen collection/handling, submission of specimen other than nasopharyngeal swab, presence of viral mutation(s) within the areas targeted by this assay, and inadequate number of viral copies(<138  copies/mL). A negative result must be combined with clinical observations, patient history, and epidemiological information. The expected result is Negative.  Fact Sheet for Patients:  EntrepreneurPulse.com.au  Fact Sheet for Healthcare Providers:  IncredibleEmployment.be  This test is no t yet approved or cleared by the [place] FDA and  has been authorized for detection and/or diagnosis of SARS-CoV-2 by FDA under an Emergency Use Authorization (EUA). This EUA will remain  in effect (meaning this test can be used) for the duration of the COVID-19 declaration under Section 564(b)(1) of the Act, 21 U.S.C.section 360bbb-3(b)(1), unless the authorization is terminated  or revoked sooner.       Influenza A by PCR NEGATIVE NEGATIVE Final   Influenza B by PCR NEGATIVE NEGATIVE Final    Comment: (NOTE) The Xpert Xpress SARS-CoV-2/FLU/RSV plus assay is intended as an aid in the diagnosis of influenza from Nasopharyngeal swab specimens and should not be used as a sole basis for treatment. Nasal washings and aspirates are unacceptable for Xpert Xpress SARS-CoV-2/FLU/RSV testing.  Fact Sheet for Patients: EntrepreneurPulse.com.au  Fact Sheet for Healthcare Providers: IncredibleEmployment.be  This test is not yet approved or cleared by the [place] FDA and has been authorized for detection and/or diagnosis of SARS-CoV-2 by FDA under an Emergency Use Authorization (EUA). This EUA will remain in effect (meaning this test can be used) for the duration of the COVID-19 declaration under Section 564(b)(1) of the Act, 21 U.S.C. section 360bbb-3(b)(1), unless the authorization is terminated or revoked.  Performed at [facility], [address], [place], [place] [address]      Time coordinating discharge: Over 30 minutes  SIGNED:   Wyvonnia Dusky, MD  [facility] 06/20/2021, 1:05  PM Pager   If 7PM-7AM, please contact night-coverage

## 2021-06-21 ENCOUNTER — Encounter: Payer: Self-pay | Admitting: Internal Medicine

## 2021-06-21 LAB — ALDOSTERONE + RENIN ACTIVITY W/ RATIO
ALDO / PRA Ratio: <4.4 (ref 0.0–30.0)
Aldosterone: <1 ng/dL (ref 0.0–30.0)
PRA LC/MS/MS: 0.228 ng/mL/hr (ref 0.167–5.380)

## 2021-06-21 NOTE — Assessment & Plan Note (Signed)
BP Readings from Last 3 Encounters:  06/20/21 115/77  06/16/21 116/68  06/05/21 104/74   Stable, pt to continue medical treatment  - lasix

## 2021-06-21 NOTE — Assessment & Plan Note (Signed)
Suspect may be worsening again, delcines lab today as is scheduled for tomorrow,  to f/u any worsening symptoms or concerns

## 2021-06-21 NOTE — Assessment & Plan Note (Signed)
Lab Results  Component Value Date   HGBA1C 5.4 12/12/2019   Stable, pt to continue current medical treatment   - diet

## 2021-06-23 ENCOUNTER — Telehealth: Payer: Self-pay

## 2021-06-23 ENCOUNTER — Other Ambulatory Visit: Payer: Self-pay | Admitting: Physician Assistant

## 2021-06-23 NOTE — Telephone Encounter (Signed)
Transition Care Management Follow-up Telephone Call Date of discharge and from where: [facility] 06/20/21 How have you been since you were released from the hospital? Pretty good Any questions or concerns? No  Items Reviewed: Did the pt receive and understand the discharge instructions provided? Yes  Medications obtained and verified? Yes  Other? No  Any new allergies since your discharge? No  Dietary orders reviewed? Yes Do you have support at home? Yes   Home Care and Equipment/Supplies: Were home health services ordered? not applicable If so, what is the name of the agency?   Has the agency set up a time to come to the patient's home? not applicable Were any new equipment or medical supplies ordered?  No What is the name of the medical supply agency?  Were you able to get the supplies/equipment? not applicable Do you have any questions related to the use of the equipment or supplies? No  Functional Questionnaire: (I = Independent and D = Dependent) ADLs: I  Bathing/Dressing- I  Meal Prep- I  Eating- I  Maintaining continence- I  Transferring/Ambulation- I  Managing Meds- I  Follow up appointments reviewed:  PCP Hospital f/u appt confirmed? No   Specialist Hospital f/u appt confirmed? Yes  Scheduled to see oncology all this week Are transportation arrangements needed? No  If their condition worsens, is the pt aware to call PCP or go to the Emergency Dept.? Yes Was the patient provided with contact information for the PCP's office or ED? Yes Was to pt encouraged to call back with questions or concerns? Yes

## 2021-06-23 NOTE — Progress Notes (Signed)
[facility] OFFICE PROGRESS NOTE  [name], MD [place] [id number]  DIAGNOSIS: Extensive stage (T2b, N2, M1c) small cell lung cancer presented with left upper lobe pulmonary nodule in addition to left hilar mass with mediastinal invasion and solitary brain metastasis diagnosed in May 2021  PRIOR THERAPY: 1) 1) Palliative radiation to the left hilar and mediastinal lymphadenopathy under the care of Dr. Lisbeth Renshaw. 2) Cranial irradiation from 08/26/20-09/06/20 under the care of Dr. Lisbeth Renshaw 3) 2) Radiation to the enlarging left adrenal nodules under the care of Dr. Lisbeth Renshaw. Completed in July 2022.  3) Palliative systemic chemotherapy with carboplatin for AUC of 5 on day 1, etoposide 100 mg/M2 on days 1, 2 and 3 as well as Imfinzi 1500 mg IV every 3 weeks with the chemotherapy.  First dose of chemotherapy May 06, 2020.  The patient will receive treatment with Cosela on the days of his chemotherapy.  Status post 14 cycles.  Starting from cycle #5 he started maintenance immunotherapy with Imfinzi 1500 mg IV every 4 weeks. Discontinued due to disease progression. Last dose 05/29/21.   CURRENT THERAPY: Carboplatin for an AUC of 5 and etoposide 100 mg per metered squared on days 1, 2, and 3 IV every 3 weeks Neulasta or Cosela.  First dose expected on 06/24/2021.  INTERVAL HISTORY: Richard Santos 60 y.o. male returns to the clinic today for a follow-up visit.  The patient was last seen in the clinic on 05/29/2020.  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  He has been hospitalized several times in the last few weeks for severe hyponatremia although the patient has remained asymptomatic without any seizure activity, syncope, nausea, mild confusion, headaches, or weakness.  His most recent hospitalization was from 06/17/2021 to 06/20/2021.  The patient was  put on a 3% saline drip.  He also received 1 dose of Tolvaptan.  He was discharged home with sodium chloride 1 g tablets to take twice daily, Lasix 20 mg daily, and a fluid restriction of 120 mL daily as recommended by nephrology.  He is expected to follow with nephrology in 1 to 2 weeks after discharge. He does not have an appointment at this time. Per their note, they are going to arrange Tolvaptan 15mg  once and monitor labs.   Due to some disease progression on most recent imaging and worsening SIADH, Dr. Julien Nordmann recommends that we discontinue the patient's immunotherapy with Imfinzi and replace him on chemotherapy.  He is expected to restart chemotherapy today.  He also completed palliative radiation to the enlarging left adrenal gland nodules under the care of Dr. Lisbeth Renshaw in July 2022.  Since being discharged, the patient is feeling overall well. The fluid restriction has been challenging for him since it is summer and hot outside He was outside yesterday and was sweating a lot. He denies any seizure, altered mental status, or confusion. He has been taking his sodium tablets as prescribed. He endorses the same forgetfulness he has been experiencing for the last 4-5 months since having brain radiation. Denies any headaches, changes in vision, weakness, nausea, or diarrhea. He has had some constipation and has not had a bowel movement since Wednesday of last week. He denies abdominal pain. He tried an enema once and Doculax once. He denies any fever, chills or night sweats.  He reports while in the hospital  he was not eating or sleeping well at all due to the food and environment. He lost some weight while hospitalized. He reports dyspnea with exertion. He denies any chest pain or hemoptysis. He denies any rashes or skin changes.  The patient is here today for evaluation, repeat blood work, and a more detailed discussion about his current condition and recommended treatment options.    MEDICAL  HISTORY: Past Medical History:  Diagnosis Date   Allergic rhinitis    Allergy    Anxiety    Chronic low back pain    DDD (degenerative disc disease), lumbar    ED (erectile dysfunction)    Finger injury    cut pads off 3rd and 4th finger left hand/due to lawn mower accident   GERD (gastroesophageal reflux disease)    Heart murmur    as child only-    History of kidney stones    HTN (hypertension) 08/20/2016   Hyperlipidemia    Incomplete right bundle branch block    Prostate cancer ([place]) dx 10/02/14   stage T1c   Sigmoid diverticulosis    Small cell lung cancer ([place]) 03/2020   Wears glasses     ALLERGIES:  is allergic to bee venom, elemental sulfur, other, sulfa antibiotics, and namenda [memantine].  MEDICATIONS:  Current Outpatient Medications  Medication Sig Dispense Refill   acetaminophen (TYLENOL) 325 MG tablet Take 2 tablets (650 mg total) by mouth every 6 (six) hours as needed for mild pain (or Fever >/= 101). 30 tablet 0   aspirin EC 81 MG tablet Take 81 mg by mouth daily. Swallow whole.     BREZTRI AEROSPHERE 160-9-4.8 MCG/ACT AERO INHALE 2 PUFFS INTO THE LUNGS IN THE MORNING AND AT BEDTIME. 10.7 g 10   famotidine (PEPCID) 20 MG tablet Take 1 tablet (20 mg total) by mouth 2 (two) times daily. 60 tablet 2   furosemide (LASIX) 20 MG tablet Take 1 tablet (20 mg total) by mouth daily. 30 tablet 0   Multiple Vitamin (MULTIVITAMIN) capsule Take 1 capsule by mouth daily.     ondansetron (ZOFRAN-ODT) 8 MG disintegrating tablet Take 1 tablet (8 mg total) by mouth every 8 (eight) hours as needed for nausea or vomiting. 30 tablet 2   pantoprazole (PROTONIX) 40 MG tablet Take 40 mg by mouth every morning.     prochlorperazine (COMPAZINE) 10 MG tablet Take 1 tablet (10 mg total) by mouth every 6 (six) hours as needed. 30 tablet 2   sodium chloride 1 g tablet Take 1 tablet (1 g total) by mouth 2 (two) times daily with a meal. 60 tablet 0   traMADol (ULTRAM) 50 MG tablet TAKE 1/2  TABLET BY MOUTH TWICE A DAY AS NEEDED FOR PAIN 60 tablet 2   No current facility-administered medications for this visit.    SURGICAL HISTORY:  Past Surgical History:  Procedure Laterality Date   BRONCHIAL BIOPSY  04/12/2020   Procedure: BRONCHIAL BIOPSIES;  Surgeon: Marshell Garfinkel, MD;  Location: [facility];  Service: Cardiopulmonary;;   BRONCHIAL BRUSHINGS  04/12/2020   Procedure: BRONCHIAL BRUSHINGS;  Surgeon: Marshell Garfinkel, MD;  Location: [facility];  Service: Cardiopulmonary;;   BRONCHIAL NEEDLE ASPIRATION BIOPSY  04/12/2020   Procedure: BRONCHIAL NEEDLE ASPIRATION BIOPSIES;  Surgeon: Marshell Garfinkel, MD;  Location: [facility];  Service: Cardiopulmonary;;   BRONCHIAL WASHINGS  04/12/2020   Procedure: BRONCHIAL WASHINGS;  Surgeon: Marshell Garfinkel, MD;  Location: [facility];  Service: Cardiopulmonary;;   COLONOSCOPY     COLONOSCOPY W/ POLYPECTOMY  04-19-2012   ENDOBRONCHIAL ULTRASOUND N/A 04/12/2020   Procedure: ENDOBRONCHIAL ULTRASOUND;  Surgeon: Marshell Garfinkel, MD;  Location: [facility];  Service: Cardiopulmonary;  Laterality: N/A;   ESOPHAGOGASTRODUODENOSCOPY  08-05-2010   HEMOSTASIS CONTROL  04/12/2020   Procedure: HEMOSTASIS CONTROL;  Surgeon: Marshell Garfinkel, MD;  Location: [facility];  Service: Cardiopulmonary;;   IR IMAGING GUIDED PORT INSERTION  05/31/2020   POLYPECTOMY     PROSTATE BIOPSY  10/02/14   RADIOACTIVE SEED IMPLANT N/A 01/30/2015   Procedure: RADIOACTIVE SEED IMPLANT;  Surgeon: Rana Snare, MD;  Location: [facility];  Service: Urology;  Laterality: N/A;   UPPER GASTROINTESTINAL ENDOSCOPY  04/03/2021   VIDEO BRONCHOSCOPY N/A 04/12/2020   Procedure: VIDEO BRONCHOSCOPY WITHOUT FLUORO;  Surgeon: Marshell Garfinkel, MD;  Location: [facility];  Service: Cardiopulmonary;  Laterality: N/A;    REVIEW OF SYSTEMS:   Review of Systems  Constitutional: Positive for weight loss. Negative for appetite change, chills, fatigue, and fever. HENT:    Negative for mouth sores, nosebleeds, sore throat and trouble swallowing.   Eyes: Negative for eye problems and icterus.  Respiratory: Positive for baseline dyspnea on exertion. Negative for cough, hemoptysis,  and wheezing.   Cardiovascular: Negative for chest pain and leg swelling.  Gastrointestinal: Positive for constipation. Negative for abdominal pain, diarrhea, nausea and vomiting.  Genitourinary: Negative for bladder incontinence, difficulty urinating, dysuria, frequency and hematuria.   Musculoskeletal: Negative for back pain, gait problem, neck pain and neck stiffness.  Skin: Negative for itching and rash.  Neurological: Negative for dizziness, extremity weakness, gait problem, headaches, light-headedness and seizures.  Hematological: Negative for adenopathy. Does not bruise/bleed easily.  Psychiatric/Behavioral: Negative for confusion, depression and sleep disturbance. The patient is not nervous/anxious.     PHYSICAL EXAMINATION:  Blood pressure 108/81, pulse 87, temperature 97.9 F (36.6 C), temperature source Oral, resp. rate 17, weight 143 lb 6.4 oz (65 kg), SpO2 100 %.  ECOG PERFORMANCE STATUS: 1  Physical Exam  Constitutional: Oriented to person, place, and time and thin appearing male and in no distress. HENT:  Head: Normocephalic and atraumatic.  Mouth/Throat: Oropharynx is clear and moist. No oropharyngeal exudate.  Eyes: Conjunctivae are normal. Right eye exhibits no discharge. Left eye exhibits no discharge. No scleral icterus.  Neck: Normal range of motion. Neck supple.  Cardiovascular: Normal rate, regular rhythm, normal heart sounds and intact distal pulses.   Pulmonary/Chest: Effort normal and breath sounds normal. No respiratory distress. No wheezes. No rales.  Abdominal: Soft. Bowel sounds are normal. Exhibits no distension and no mass. There is no tenderness.  Musculoskeletal: Normal range of motion. Exhibits no edema.  Lymphadenopathy:    No cervical  adenopathy.  Neurological: Alert and oriented to person, place, and time. Exhibits normal muscle tone. Gait normal. Coordination normal.  Skin: Skin is warm and dry. No rash noted. Not diaphoretic. No erythema. No pallor.  Psychiatric: Mood, memory and judgment normal.  Vitals reviewed.  LABORATORY DATA: Lab Results  Component Value Date   WBC 5.6 06/24/2021   HGB 10.2 (L) 06/24/2021   HCT 29.0 (L) 06/24/2021   MCV 98.0 06/24/2021   PLT 286 06/24/2021      Chemistry      Component Value Date/Time   NA 130 (L) 06/24/2021 0749   K 4.1 06/24/2021 0749   CL 94 (L) 06/24/2021 0749   CO2 26 06/24/2021 0749   BUN 17 06/24/2021 0749   CREATININE 0.79 06/24/2021 0749      Component Value Date/Time  CALCIUM 9.7 06/24/2021 0749   ALKPHOS 72 06/24/2021 0749   AST 32 06/24/2021 0749   ALT 27 06/24/2021 0749   BILITOT 0.4 06/24/2021 0749       RADIOGRAPHIC STUDIES:  No results found.   ASSESSMENT/PLAN:  This is a very pleasant 60 year old Caucasian male diagnosed with extensive stage small cell lung cancer.  He presented with a left upper lobe lung mass in addition to left hilar mediastinal lymphadenopathy.  He also had metastatic disease to the brain.  He was diagnosed in May 2021.   He underwent palliative radiotherapy to the left hilar and mediastinal lymphadenopathy in addition to radiotherapy to the brain in October 2021.   He completed systemic chemotherapy with carboplatin for an AUC of 5 on day 1, etoposide 100 mg per metered squared on days 1, 2, and 3 with cosela infusion before chemotherapy.  He is status post 13 cycles of treatment.  Starting from cycle #5, the patient had been on maintenance immunotherapy IV every 4 weeks.    The patient completed palliative radiation to the enlarging left adrenal nodules under the care of Dr. Lisbeth Renshaw. This was completed in July 2022.   The patient was seen with Dr. Julien Nordmann today.  Given the patient's SIADH and progressive disease, Dr.  Julien Nordmann recommends changing the patient's treatment back to chemotherapy with carboplatin for an AUC of 5 on day 1 and etoposide 100 mg per metered squared on days 1, 2, and 3. We will check to see if cosela can be used for myeloprotection. If so, we will remove his neulasta injection appointments.  He is expected to restart chemotherapy today.   The patient was seen with Dr. Julien Nordmann today.  Dr. Julien Nordmann had lengthy discussion today with patient about his current condition and this recommendation.  The patient agreed to proceed with systemic chemotherapy today as scheduled.  He will proceed with cycle #1 today.  We will see him back for follow-up visit in 3 weeks for evaluation before starting cycle #2.  In the meantime, the patient will continue to take sodium chloride tablets twice daily, Lasix 20 mg p.o. daily, and have a 1200 milliliters fluid restriction.  He is scheduled to follow-up with nephrology in the near future. We encouraged him to call them to facilitate this process. His sodium is 130.   We will monitor his labs closely weekly.   The patient was advised to call immediately if he has any concerning symptoms in the interval. The patient voices understanding of current disease status and treatment options and is in agreement with the current care plan. All questions were answered. The patient knows to call the clinic with any problems, questions or concerns. We can certainly see the patient much sooner if necessary   No orders of the defined types were placed in this encounter.     Jaeson Molstad L Earvin Blazier, PA-C 06/24/21  ADDENDUM: Hematology/Oncology Attending: I had a face-to-face encounter with the patient today.  I reviewed his record, labs and recommended his care plan.  This is a very pleasant 60 years old white male diagnosed with extensive stage small cell lung cancer in May 2021 and he underwent palliative radiotherapy to the left hilar and mediastinal lymphadenopathy  as well as radiotherapy to the brain in October 2021.  The patient was treated with systemic chemotherapy with carboplatin, etoposide and Imfinzi as well as Cosela for initial 4 cycles followed by maintenance treatment with Imfinzi for 9 more cycles.  Unfortunately the most recent imaging  studies showed evidence for disease progression with enlarging left adrenal nodules status post palliative radiotherapy under the care of Dr. Lisbeth Renshaw in July 2022. Over the last few weeks the patient has been suffering from significant hyponatremia and frequent hospitalization for this problem.  He received treatment with fluid restriction, Lasix as well as salt tablet and few doses of Tolvaptan during his hospitalization.  His sodium gets better during the hospitalization but several days later he has the same problem again. I had a lengthy discussion with the patient and his wife today about his current condition.  Definitely the patient has evidence for disease progression of the small cell lung cancer and the current maintenance immunotherapy is not effective anymore and management of his condition. I recommended for the patient to start systemic chemotherapy again with carboplatin for AUC of 5 on day 1, etoposide 100 Mg/M2 on days 1, 2 and 3 with Cosela before the chemotherapy.  We will discontinue the immunotherapy at this point. He is expected to start the first cycle of this treatment today. We will see him back for follow-up visit and 3 weeks for evaluation before starting cycle #2. I also strongly advised the patient to continue his follow-up visit with nephrology for management of the hyponatremia until the chemotherapy is effective in controlling his disease. The patient and his wife are in agreement with the current plan. He was advised to call immediately if he has any other concerning symptoms in the interval. The total time spent in the appointment was 35 minutes. Disclaimer: This note was dictated with  voice recognition software. Similar sounding words can inadvertently be transcribed and may be missed upon review. Eilleen Kempf, MD 06/24/21

## 2021-06-23 NOTE — Telephone Encounter (Signed)
I think should be ok for oncology in his case, thanks

## 2021-06-24 ENCOUNTER — Inpatient Hospital Stay (HOSPITAL_BASED_OUTPATIENT_CLINIC_OR_DEPARTMENT_OTHER): Payer: 59 | Admitting: Physician Assistant

## 2021-06-24 ENCOUNTER — Ambulatory Visit: Payer: 59

## 2021-06-24 ENCOUNTER — Inpatient Hospital Stay: Payer: 59 | Attending: Internal Medicine

## 2021-06-24 ENCOUNTER — Other Ambulatory Visit: Payer: Self-pay

## 2021-06-24 VITALS — BP 108/81 | HR 87 | Temp 97.9°F | Resp 17 | Wt 143.4 lb

## 2021-06-24 DIAGNOSIS — C3412 Malignant neoplasm of upper lobe, left bronchus or lung: Secondary | ICD-10-CM

## 2021-06-24 DIAGNOSIS — Z5189 Encounter for other specified aftercare: Secondary | ICD-10-CM | POA: Insufficient documentation

## 2021-06-24 DIAGNOSIS — Z8546 Personal history of malignant neoplasm of prostate: Secondary | ICD-10-CM | POA: Diagnosis not present

## 2021-06-24 DIAGNOSIS — Z5111 Encounter for antineoplastic chemotherapy: Secondary | ICD-10-CM

## 2021-06-24 DIAGNOSIS — Z79899 Other long term (current) drug therapy: Secondary | ICD-10-CM | POA: Insufficient documentation

## 2021-06-24 DIAGNOSIS — E222 Syndrome of inappropriate secretion of antidiuretic hormone: Secondary | ICD-10-CM

## 2021-06-24 DIAGNOSIS — Z923 Personal history of irradiation: Secondary | ICD-10-CM | POA: Diagnosis not present

## 2021-06-24 DIAGNOSIS — C7931 Secondary malignant neoplasm of brain: Secondary | ICD-10-CM | POA: Insufficient documentation

## 2021-06-24 DIAGNOSIS — Z7982 Long term (current) use of aspirin: Secondary | ICD-10-CM | POA: Diagnosis not present

## 2021-06-24 DIAGNOSIS — I10 Essential (primary) hypertension: Secondary | ICD-10-CM | POA: Insufficient documentation

## 2021-06-24 DIAGNOSIS — Z95828 Presence of other vascular implants and grafts: Secondary | ICD-10-CM

## 2021-06-24 LAB — CBC WITH DIFFERENTIAL (CANCER CENTER ONLY)
Abs Immature Granulocytes: 0.05 10*3/uL (ref 0.00–0.07)
Basophils Absolute: 0 10*3/uL (ref 0.0–0.1)
Basophils Relative: 1 %
Eosinophils Absolute: 0.3 10*3/uL (ref 0.0–0.5)
Eosinophils Relative: 6 %
HCT: 29 % — ABNORMAL LOW (ref 39.0–52.0)
Hemoglobin: 10.2 g/dL — ABNORMAL LOW (ref 13.0–17.0)
Immature Granulocytes: 1 %
Lymphocytes Relative: 5 %
Lymphs Abs: 0.3 10*3/uL — ABNORMAL LOW (ref 0.7–4.0)
MCH: 34.5 pg — ABNORMAL HIGH (ref 26.0–34.0)
MCHC: 35.2 g/dL (ref 30.0–36.0)
MCV: 98 fL (ref 80.0–100.0)
Monocytes Absolute: 0.8 10*3/uL (ref 0.1–1.0)
Monocytes Relative: 14 %
Neutro Abs: 4.1 10*3/uL (ref 1.7–7.7)
Neutrophils Relative %: 73 %
Platelet Count: 286 10*3/uL (ref 150–400)
RBC: 2.96 MIL/uL — ABNORMAL LOW (ref 4.22–5.81)
RDW: 12.9 % (ref 11.5–15.5)
WBC Count: 5.6 10*3/uL (ref 4.0–10.5)
nRBC: 0 % (ref 0.0–0.2)

## 2021-06-24 LAB — CMP (CANCER CENTER ONLY)
ALT: 27 U/L (ref 0–44)
AST: 32 U/L (ref 15–41)
Albumin: 3.3 g/dL — ABNORMAL LOW (ref 3.5–5.0)
Alkaline Phosphatase: 72 U/L (ref 38–126)
Anion gap: 10 (ref 5–15)
BUN: 17 mg/dL (ref 6–20)
CO2: 26 mmol/L (ref 22–32)
Calcium: 9.7 mg/dL (ref 8.9–10.3)
Chloride: 94 mmol/L — ABNORMAL LOW (ref 98–111)
Creatinine: 0.79 mg/dL (ref 0.61–1.24)
GFR, Estimated: >60 mL/min (ref >60)
Glucose, Bld: 95 mg/dL (ref 70–99)
Potassium: 4.1 mmol/L (ref 3.5–5.1)
Sodium: 130 mmol/L — ABNORMAL LOW (ref 135–145)
Total Bilirubin: 0.4 mg/dL (ref 0.3–1.2)
Total Protein: 7.1 g/dL (ref 6.5–8.1)

## 2021-06-24 MED ORDER — HEPARIN SOD (PORK) LOCK FLUSH 100 UNIT/ML IV SOLN
500.0000 [IU] | Freq: Once | INTRAVENOUS | Status: AC | PRN
  Administered 2021-06-24: 500 [IU]
  Filled 2021-06-24: qty 5

## 2021-06-24 MED ORDER — PALONOSETRON HCL INJECTION 0.25 MG/5ML
0.2500 mg | Freq: Once | INTRAVENOUS | Status: AC
  Administered 2021-06-24: 0.25 mg via INTRAVENOUS

## 2021-06-24 MED ORDER — SODIUM CHLORIDE 0.9 % IV SOLN
150.0000 mg | Freq: Once | INTRAVENOUS | Status: AC
  Administered 2021-06-24: 150 mg via INTRAVENOUS
  Filled 2021-06-24: qty 150

## 2021-06-24 MED ORDER — SODIUM CHLORIDE 0.9% FLUSH
10.0000 mL | INTRAVENOUS | Status: DC | PRN
  Administered 2021-06-24: 10 mL
  Filled 2021-06-24: qty 10

## 2021-06-24 MED ORDER — SODIUM CHLORIDE 0.9 % IV SOLN
595.5000 mg | Freq: Once | INTRAVENOUS | Status: AC
  Administered 2021-06-24: 600 mg via INTRAVENOUS
  Filled 2021-06-24: qty 60

## 2021-06-24 MED ORDER — SODIUM CHLORIDE 0.9 % IV SOLN
100.0000 mg/m2 | Freq: Once | INTRAVENOUS | Status: AC
  Administered 2021-06-24: 180 mg via INTRAVENOUS
  Filled 2021-06-24: qty 9

## 2021-06-24 MED ORDER — TRILACICLIB DIHYDROCHLORIDE INJECTION 300 MG: 240.0000 mg/m2 | Freq: Once | INTRAVENOUS | Status: DC

## 2021-06-24 MED ORDER — SODIUM CHLORIDE 0.9 % IV SOLN
10.0000 mg | Freq: Once | INTRAVENOUS | Status: AC
  Administered 2021-06-24: 10 mg via INTRAVENOUS
  Filled 2021-06-24: qty 10

## 2021-06-24 MED ORDER — SODIUM CHLORIDE 0.9 % IV SOLN
Freq: Once | INTRAVENOUS | Status: AC
  Filled 2021-06-24: qty 250

## 2021-06-24 MED ORDER — SODIUM CHLORIDE 0.9% FLUSH
10.0000 mL | Freq: Once | INTRAVENOUS | Status: AC
  Administered 2021-06-24: 10 mL
  Filled 2021-06-24: qty 10

## 2021-06-24 NOTE — Patient Instructions (Signed)
[facility]  Discharge Instructions: Thank you for choosing [facility] to provide your oncology and hematology care.   If you have a lab appointment with the [facility], please go directly to the [facility] and check in at the registration area.   Wear comfortable clothing and clothing appropriate for easy access to any Portacath or PICC line.   We strive to give you quality time with your provider. You may need to reschedule your appointment if you arrive late (15 or more minutes).  Arriving late affects you and other patients whose appointments are after yours.  Also, if you miss three or more appointments without notifying the office, you may be dismissed from the clinic at the provider's discretion.      For prescription refill requests, have your pharmacy contact our office and allow 72 hours for refills to be completed.    Today you received the following chemotherapy and/or immunotherapy agents:  Carboplatin/Etoposide.      To help prevent nausea and vomiting after your treatment, we encourage you to take your nausea medication as directed.  BELOW ARE SYMPTOMS THAT SHOULD BE REPORTED IMMEDIATELY: *FEVER GREATER THAN 100.4 F (38 C) OR HIGHER *CHILLS OR SWEATING *NAUSEA AND VOMITING THAT IS NOT CONTROLLED WITH YOUR NAUSEA MEDICATION *UNUSUAL SHORTNESS OF BREATH *UNUSUAL BRUISING OR BLEEDING *URINARY PROBLEMS (pain or burning when urinating, or frequent urination) *BOWEL PROBLEMS (unusual diarrhea, constipation, pain near the anus) TENDERNESS IN MOUTH AND THROAT WITH OR WITHOUT PRESENCE OF ULCERS (sore throat, sores in mouth, or a toothache) UNUSUAL RASH, SWELLING OR PAIN  UNUSUAL VAGINAL DISCHARGE OR ITCHING   Items with * indicate a potential emergency and should be followed up as soon as possible or go to the Emergency Department if any problems should occur.  Please show the CHEMOTHERAPY ALERT CARD or IMMUNOTHERAPY ALERT CARD  at check-in to the Emergency Department and triage nurse.  Should you have questions after your visit or need to cancel or reschedule your appointment, please contact [facility]  Dept: [phone]  and follow the prompts.  Office hours are 8:00 a.m. to 4:30 p.m. Monday - Friday. Please note that voicemails left after 4:00 p.m. may not be returned until the following business day.  We are closed weekends and major holidays. You have access to a nurse at all times for urgent questions. Please call the main number to the clinic Dept: [phone] and follow the prompts.   For any non-urgent questions, you may also contact your provider using MyChart. We now offer e-Visits for anyone 69 and older to request care online for non-urgent symptoms. For details visit [email or web address].   Also download the MyChart app! Go to the app store, search "MyChart", open the app, select [facility], and log in with your MyChart username and password.  Due to Covid, a mask is required upon entering the hospital/clinic. If you do not have a mask, one will be given to you upon arrival. For doctor visits, patients may have 1 support person aged 7 or older with them. For treatment visits, patients cannot have anyone with them due to current Covid guidelines and our immunocompromised population.

## 2021-06-25 ENCOUNTER — Inpatient Hospital Stay: Payer: 59

## 2021-06-25 VITALS — BP 104/76 | HR 86 | Temp 97.9°F | Resp 17

## 2021-06-25 DIAGNOSIS — Z5111 Encounter for antineoplastic chemotherapy: Secondary | ICD-10-CM | POA: Diagnosis not present

## 2021-06-25 DIAGNOSIS — C3412 Malignant neoplasm of upper lobe, left bronchus or lung: Secondary | ICD-10-CM

## 2021-06-25 MED ORDER — SODIUM CHLORIDE 0.9% FLUSH
10.0000 mL | INTRAVENOUS | Status: DC | PRN
  Administered 2021-06-25: 10 mL
  Filled 2021-06-25: qty 10

## 2021-06-25 MED ORDER — DEXAMETHASONE SODIUM PHOSPHATE 100 MG/10ML IJ SOLN
10.0000 mg | Freq: Once | INTRAMUSCULAR | Status: AC
  Administered 2021-06-25: 10 mg via INTRAVENOUS
  Filled 2021-06-25: qty 10

## 2021-06-25 MED ORDER — HEPARIN SOD (PORK) LOCK FLUSH 100 UNIT/ML IV SOLN
500.0000 [IU] | Freq: Once | INTRAVENOUS | Status: AC | PRN
  Administered 2021-06-25: 500 [IU]
  Filled 2021-06-25: qty 5

## 2021-06-25 MED ORDER — SODIUM CHLORIDE 0.9 % IV SOLN
100.0000 mg/m2 | Freq: Once | INTRAVENOUS | Status: AC
  Administered 2021-06-25: 180 mg via INTRAVENOUS
  Filled 2021-06-25: qty 9

## 2021-06-25 MED ORDER — SODIUM CHLORIDE 0.9 % IV SOLN
Freq: Once | INTRAVENOUS | Status: AC
  Filled 2021-06-25: qty 250

## 2021-06-25 NOTE — Patient Instructions (Signed)
[facility]   Discharge Instructions: Thank you for choosing [facility] to provide your oncology and hematology care.   If you have a lab appointment with the [facility], please go directly to the [facility] and check in at the registration area.   Wear comfortable clothing and clothing appropriate for easy access to any Portacath or PICC line.   We strive to give you quality time with your provider. You may need to reschedule your appointment if you arrive late (15 or more minutes).  Arriving late affects you and other patients whose appointments are after yours.  Also, if you miss three or more appointments without notifying the office, you may be dismissed from the clinic at the provider's discretion.      For prescription refill requests, have your pharmacy contact our office and allow 72 hours for refills to be completed.    Today you received the following chemotherapy and/or immunotherapy agents: Etoposide       To help prevent nausea and vomiting after your treatment, we encourage you to take your nausea medication as directed.  BELOW ARE SYMPTOMS THAT SHOULD BE REPORTED IMMEDIATELY: *FEVER GREATER THAN 100.4 F (38 C) OR HIGHER *CHILLS OR SWEATING *NAUSEA AND VOMITING THAT IS NOT CONTROLLED WITH YOUR NAUSEA MEDICATION *UNUSUAL SHORTNESS OF BREATH *UNUSUAL BRUISING OR BLEEDING *URINARY PROBLEMS (pain or burning when urinating, or frequent urination) *BOWEL PROBLEMS (unusual diarrhea, constipation, pain near the anus) TENDERNESS IN MOUTH AND THROAT WITH OR WITHOUT PRESENCE OF ULCERS (sore throat, sores in mouth, or a toothache) UNUSUAL RASH, SWELLING OR PAIN  UNUSUAL VAGINAL DISCHARGE OR ITCHING   Items with * indicate a potential emergency and should be followed up as soon as possible or go to the Emergency Department if any problems should occur.  Please show the CHEMOTHERAPY ALERT CARD or IMMUNOTHERAPY ALERT CARD at check-in  to the Emergency Department and triage nurse.  Should you have questions after your visit or need to cancel or reschedule your appointment, please contact [facility]  Dept: [phone]  and follow the prompts.  Office hours are 8:00 a.m. to 4:30 p.m. Monday - Friday. Please note that voicemails left after 4:00 p.m. may not be returned until the following business day.  We are closed weekends and major holidays. You have access to a nurse at all times for urgent questions. Please call the main number to the clinic Dept: [phone] and follow the prompts.   For any non-urgent questions, you may also contact your provider using MyChart. We now offer e-Visits for anyone 20 and older to request care online for non-urgent symptoms. For details visit [email or web address].   Also download the MyChart app! Go to the app store, search "MyChart", open the app, select [facility], and log in with your MyChart username and password.  Due to Covid, a mask is required upon entering the hospital/clinic. If you do not have a mask, one will be given to you upon arrival. For doctor visits, patients may have 1 support person aged 82 or older with them. For treatment visits, patients cannot have anyone with them due to current Covid guidelines and our immunocompromised population.

## 2021-06-26 ENCOUNTER — Inpatient Hospital Stay: Payer: 59

## 2021-06-26 ENCOUNTER — Other Ambulatory Visit: Payer: 59

## 2021-06-26 ENCOUNTER — Other Ambulatory Visit: Payer: Self-pay

## 2021-06-26 ENCOUNTER — Ambulatory Visit: Payer: 59 | Admitting: Internal Medicine

## 2021-06-26 ENCOUNTER — Ambulatory Visit: Payer: 59

## 2021-06-26 ENCOUNTER — Telehealth: Payer: Self-pay | Admitting: Internal Medicine

## 2021-06-26 VITALS — BP 104/78 | HR 86 | Temp 97.7°F | Resp 16 | Wt 146.1 lb

## 2021-06-26 DIAGNOSIS — C3412 Malignant neoplasm of upper lobe, left bronchus or lung: Secondary | ICD-10-CM

## 2021-06-26 DIAGNOSIS — R110 Nausea: Secondary | ICD-10-CM

## 2021-06-26 DIAGNOSIS — C7931 Secondary malignant neoplasm of brain: Secondary | ICD-10-CM

## 2021-06-26 DIAGNOSIS — Z5111 Encounter for antineoplastic chemotherapy: Secondary | ICD-10-CM | POA: Diagnosis not present

## 2021-06-26 MED ORDER — HEPARIN SOD (PORK) LOCK FLUSH 100 UNIT/ML IV SOLN
500.0000 [IU] | Freq: Once | INTRAVENOUS | Status: AC | PRN
  Administered 2021-06-26: 500 [IU]
  Filled 2021-06-26: qty 5

## 2021-06-26 MED ORDER — SODIUM CHLORIDE 0.9 % IV SOLN
100.0000 mg/m2 | Freq: Once | INTRAVENOUS | Status: AC
  Administered 2021-06-26: 180 mg via INTRAVENOUS
  Filled 2021-06-26: qty 9

## 2021-06-26 MED ORDER — SODIUM CHLORIDE 0.9 % IV SOLN
Freq: Once | INTRAVENOUS | Status: AC
  Filled 2021-06-26: qty 250

## 2021-06-26 MED ORDER — ONDANSETRON 8 MG PO TBDP: 8.0000 mg | ORAL_TABLET | Freq: Three times a day (TID) | ORAL | 2 refills | Status: DC | PRN

## 2021-06-26 MED ORDER — SODIUM CHLORIDE 0.9% FLUSH
10.0000 mL | INTRAVENOUS | Status: DC | PRN
  Administered 2021-06-26: 10 mL
  Filled 2021-06-26: qty 10

## 2021-06-26 MED ORDER — SODIUM CHLORIDE 0.9 % IV SOLN
10.0000 mg | Freq: Once | INTRAVENOUS | Status: AC
  Administered 2021-06-26: 10 mg via INTRAVENOUS
  Filled 2021-06-26: qty 10

## 2021-06-26 NOTE — Patient Instructions (Signed)
[facility]COL[facility]  Discharge Instructions: Thank you for choosing [facility] to provide your oncology and hematology care.   If you have a lab appointment with the [facility], please go directly to the [facility] and check in at the registration area.   Wear comfortable clothing and clothing appropriate for easy access to any Portacath or PICC line.   We strive to give you quality time with your provider. You may need to reschedule your appointment if you arrive late (15 or more minutes).  Arriving late affects you and other patients whose appointments are after yours.  Also, if you miss three or more appointments without notifying the office, you may be dismissed from the clinic at the provider's discretion.      For prescription refill requests, have your pharmacy contact our office and allow 72 hours for refills to be completed.    Today you received the following chemotherapy and/or immunotherapy agents Etoposide      To help prevent nausea and vomiting after your treatment, we encourage you to take your nausea medication as directed.  BELOW ARE SYMPTOMS THAT SHOULD BE REPORTED IMMEDIATELY: *FEVER GREATER THAN 100.4 F (38 C) OR HIGHER *CHILLS OR SWEATING *NAUSEA AND VOMITING THAT IS NOT CONTROLLED WITH YOUR NAUSEA MEDICATION *UNUSUAL SHORTNESS OF BREATH *UNUSUAL BRUISING OR BLEEDING *URINARY PROBLEMS (pain or burning when urinating, or frequent urination) *BOWEL PROBLEMS (unusual diarrhea, constipation, pain near the anus) TENDERNESS IN MOUTH AND THROAT WITH OR WITHOUT PRESENCE OF ULCERS (sore throat, sores in mouth, or a toothache) UNUSUAL RASH, SWELLING OR PAIN  UNUSUAL VAGINAL DISCHARGE OR ITCHING   Items with * indicate a potential emergency and should be followed up as soon as possible or go to the Emergency Department if any problems should occur.  Please show the CHEMOTHERAPY ALERT CARD or IMMUNOTHERAPY ALERT CARD at check-in to  the Emergency Department and triage nurse.  Should you have questions after your visit or need to cancel or reschedule your appointment, please contact [facility]  Dept: [phone]  and follow the prompts.  Office hours are 8:00 a.m. to 4:30 p.m. Monday - Friday. Please note that voicemails left after 4:00 p.m. may not be returned until the following business day.  We are closed weekends and major holidays. You have access to a nurse at all times for urgent questions. Please call the main number to the clinic Dept: [phone] and follow the prompts.   For any non-urgent questions, you may also contact your provider using MyChart. We now offer e-Visits for anyone 52 and older to request care online for non-urgent symptoms. For details visit [email or web address].   Also download the MyChart app! Go to the app store, search "MyChart", open the app, select [facility], and log in with your MyChart username and password.  Due to Covid, a mask is required upon entering the hospital/clinic. If you do not have a mask, one will be given to you upon arrival. For doctor visits, patients may have 1 support person aged 6 or older with them. For treatment visits, patients cannot have anyone with them due to current Covid guidelines and our immunocompromised population.

## 2021-06-26 NOTE — Telephone Encounter (Signed)
Scheduled per los. Called and spoke with patient. Confirmed appts  

## 2021-06-26 NOTE — Telephone Encounter (Signed)
Fax request for Zofran refill received and refill with 2 additional refills has been authorized by [employer].

## 2021-06-28 ENCOUNTER — Other Ambulatory Visit: Payer: Self-pay

## 2021-06-28 ENCOUNTER — Inpatient Hospital Stay: Payer: 59

## 2021-06-28 VITALS — BP 105/73 | HR 99 | Temp 98.2°F | Resp 18

## 2021-06-28 DIAGNOSIS — Z5111 Encounter for antineoplastic chemotherapy: Secondary | ICD-10-CM | POA: Diagnosis not present

## 2021-06-28 DIAGNOSIS — C3412 Malignant neoplasm of upper lobe, left bronchus or lung: Secondary | ICD-10-CM

## 2021-06-28 MED ORDER — PEGFILGRASTIM-CBQV 6 MG/0.6ML ~~LOC~~ SOSY
6.0000 mg | PREFILLED_SYRINGE | Freq: Once | SUBCUTANEOUS | Status: AC
  Administered 2021-06-28: 6 mg via SUBCUTANEOUS

## 2021-06-28 MED ORDER — PEGFILGRASTIM-CBQV 6 MG/0.6ML ~~LOC~~ SOSY
PREFILLED_SYRINGE | SUBCUTANEOUS | Status: AC
  Filled 2021-06-28: qty 0.6

## 2021-06-30 ENCOUNTER — Telehealth: Payer: Self-pay | Admitting: Physician Assistant

## 2021-06-30 ENCOUNTER — Other Ambulatory Visit: Payer: Self-pay

## 2021-06-30 ENCOUNTER — Inpatient Hospital Stay: Payer: 59

## 2021-06-30 DIAGNOSIS — Z5111 Encounter for antineoplastic chemotherapy: Secondary | ICD-10-CM | POA: Diagnosis not present

## 2021-06-30 DIAGNOSIS — Z95828 Presence of other vascular implants and grafts: Secondary | ICD-10-CM

## 2021-06-30 DIAGNOSIS — C3412 Malignant neoplasm of upper lobe, left bronchus or lung: Secondary | ICD-10-CM

## 2021-06-30 LAB — CBC WITH DIFFERENTIAL (CANCER CENTER ONLY)
Abs Immature Granulocytes: 0.1 10*3/uL — ABNORMAL HIGH (ref 0.00–0.07)
Band Neutrophils: 5 %
Basophils Absolute: 0 10*3/uL (ref 0.0–0.1)
Basophils Relative: 0 %
Eosinophils Absolute: 0 10*3/uL (ref 0.0–0.5)
Eosinophils Relative: 0 %
HCT: 29 % — ABNORMAL LOW (ref 39.0–52.0)
Hemoglobin: 10.7 g/dL — ABNORMAL LOW (ref 13.0–17.0)
Lymphocytes Relative: 1 %
Lymphs Abs: 0.1 10*3/uL — ABNORMAL LOW (ref 0.7–4.0)
MCH: 35 pg — ABNORMAL HIGH (ref 26.0–34.0)
MCHC: 36.9 g/dL — ABNORMAL HIGH (ref 30.0–36.0)
MCV: 94.8 fL (ref 80.0–100.0)
Metamyelocytes Relative: 1 %
Monocytes Absolute: 0.1 10*3/uL (ref 0.1–1.0)
Monocytes Relative: 1 %
Neutro Abs: 12.3 10*3/uL — ABNORMAL HIGH (ref 1.7–7.7)
Neutrophils Relative %: 92 %
Platelet Count: 175 10*3/uL (ref 150–400)
RBC: 3.06 MIL/uL — ABNORMAL LOW (ref 4.22–5.81)
RDW: 12.7 % (ref 11.5–15.5)
WBC Count: 12.7 10*3/uL — ABNORMAL HIGH (ref 4.0–10.5)
nRBC: 0 % (ref 0.0–0.2)

## 2021-06-30 LAB — CMP (CANCER CENTER ONLY)
ALT: 31 U/L (ref 0–44)
AST: 31 U/L (ref 15–41)
Albumin: 3.6 g/dL (ref 3.5–5.0)
Alkaline Phosphatase: 83 U/L (ref 38–126)
Anion gap: 11 (ref 5–15)
BUN: 16 mg/dL (ref 6–20)
CO2: 26 mmol/L (ref 22–32)
Calcium: 9.9 mg/dL (ref 8.9–10.3)
Chloride: 88 mmol/L — ABNORMAL LOW (ref 98–111)
Creatinine: 0.81 mg/dL (ref 0.61–1.24)
GFR, Estimated: >60 mL/min (ref >60)
Glucose, Bld: 103 mg/dL — ABNORMAL HIGH (ref 70–99)
Potassium: 3.9 mmol/L (ref 3.5–5.1)
Sodium: 125 mmol/L — ABNORMAL LOW (ref 135–145)
Total Bilirubin: 0.6 mg/dL (ref 0.3–1.2)
Total Protein: 7.3 g/dL (ref 6.5–8.1)

## 2021-06-30 MED ORDER — SODIUM CHLORIDE 0.9% FLUSH
10.0000 mL | Freq: Once | INTRAVENOUS | Status: AC
  Administered 2021-06-30: 10 mL
  Filled 2021-06-30: qty 10

## 2021-06-30 MED ORDER — HEPARIN SOD (PORK) LOCK FLUSH 100 UNIT/ML IV SOLN
500.0000 [IU] | Freq: Once | INTRAVENOUS | Status: AC
  Administered 2021-06-30: 500 [IU]
  Filled 2021-06-30: qty 5

## 2021-06-30 NOTE — Telephone Encounter (Signed)
I called the patient to discuss his sodium today.  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  His sodium is trending down and is 125 today.  I reviewed this with Dr. Julien Nordmann who does not want to adjust any of his outpatient medications at this time.  We are hopeful that having restarted his chemotherapy last week will help with his SIADH likely secondary to his cancer.  The patient saw nephrology while admitted to the hospital. From reviewing their notes, it appears that the patient was supposed to follow-up with nephrology within 1 to 2 weeks upon discharge.  He was discharged on 06/20/2021. We encouraged the patient to schedule a follow up with nephrology at his appointment with [place] on 06/24/21.  I called the patient today to see if he had made the appointment yet.  He has not made the appointment at this time.  I gave the patient the phone number to nephrology and encouraged him to call and make an appointment as their notes indicated that they are planning on giving tolvaptan outpatient as well. He expressed understanding and stated he would give them a call now. We will continue to monitor his labs closely weekly.

## 2021-07-01 ENCOUNTER — Other Ambulatory Visit: Payer: 59

## 2021-07-03 ENCOUNTER — Encounter: Payer: Self-pay | Admitting: Internal Medicine

## 2021-07-04 ENCOUNTER — Telehealth: Payer: Self-pay | Admitting: Medical Oncology

## 2021-07-04 ENCOUNTER — Other Ambulatory Visit: Payer: Self-pay | Admitting: Medical Oncology

## 2021-07-04 NOTE — Telephone Encounter (Signed)
Medication problem -Tolvaptan prescribed by  ? Dr. Lucky Cowboy. Has to be PA first .   Pt  cancelled this medication and left message at Dr Milana Na and was told he is going to prescribe something different.

## 2021-07-07 ENCOUNTER — Inpatient Hospital Stay: Payer: 59

## 2021-07-07 ENCOUNTER — Other Ambulatory Visit: Payer: Self-pay

## 2021-07-07 ENCOUNTER — Ambulatory Visit
Admission: RE | Admit: 2021-07-07 | Discharge: 2021-07-07 | Disposition: A | Discharge status: Home / Self Care | Payer: 59 | Source: Ambulatory Visit | Attending: Internal Medicine | Admitting: Internal Medicine

## 2021-07-07 DIAGNOSIS — Z95828 Presence of other vascular implants and grafts: Secondary | ICD-10-CM

## 2021-07-07 DIAGNOSIS — Z5111 Encounter for antineoplastic chemotherapy: Secondary | ICD-10-CM | POA: Diagnosis not present

## 2021-07-07 DIAGNOSIS — C3412 Malignant neoplasm of upper lobe, left bronchus or lung: Secondary | ICD-10-CM

## 2021-07-07 LAB — CBC WITH DIFFERENTIAL (CANCER CENTER ONLY)
Abs Immature Granulocytes: 0.6 10*3/uL — ABNORMAL HIGH (ref 0.00–0.07)
Basophils Absolute: 0 10*3/uL (ref 0.0–0.1)
Basophils Relative: 1 %
Eosinophils Absolute: 0 10*3/uL (ref 0.0–0.5)
Eosinophils Relative: 0 %
HCT: 24.1 % — ABNORMAL LOW (ref 39.0–52.0)
Hemoglobin: 8.4 g/dL — ABNORMAL LOW (ref 13.0–17.0)
Immature Granulocytes: 8 %
Lymphocytes Relative: 3 %
Lymphs Abs: 0.2 10*3/uL — ABNORMAL LOW (ref 0.7–4.0)
MCH: 34.1 pg — ABNORMAL HIGH (ref 26.0–34.0)
MCHC: 34.9 g/dL (ref 30.0–36.0)
MCV: 98 fL (ref 80.0–100.0)
Monocytes Absolute: 0.8 10*3/uL (ref 0.1–1.0)
Monocytes Relative: 10 %
Neutro Abs: 5.8 10*3/uL (ref 1.7–7.7)
Neutrophils Relative %: 78 %
Platelet Count: 27 10*3/uL — ABNORMAL LOW (ref 150–400)
RBC: 2.46 MIL/uL — ABNORMAL LOW (ref 4.22–5.81)
RDW: 13.2 % (ref 11.5–15.5)
WBC Count: 7.5 10*3/uL (ref 4.0–10.5)
nRBC: 0 % (ref 0.0–0.2)

## 2021-07-07 LAB — CMP (CANCER CENTER ONLY)
ALT: 20 U/L (ref 0–44)
AST: 24 U/L (ref 15–41)
Albumin: 3.3 g/dL — ABNORMAL LOW (ref 3.5–5.0)
Alkaline Phosphatase: 90 U/L (ref 38–126)
Anion gap: 11 (ref 5–15)
BUN: 14 mg/dL (ref 6–20)
CO2: 25 mmol/L (ref 22–32)
Calcium: 9.4 mg/dL (ref 8.9–10.3)
Chloride: 101 mmol/L (ref 98–111)
Creatinine: 0.79 mg/dL (ref 0.61–1.24)
GFR, Estimated: >60 mL/min (ref >60)
Glucose, Bld: 131 mg/dL — ABNORMAL HIGH (ref 70–99)
Potassium: 4.1 mmol/L (ref 3.5–5.1)
Sodium: 137 mmol/L (ref 135–145)
Total Bilirubin: <0.2 mg/dL — ABNORMAL LOW (ref 0.3–1.2)
Total Protein: 6.8 g/dL (ref 6.5–8.1)

## 2021-07-07 MED ORDER — SODIUM CHLORIDE 0.9% FLUSH
10.0000 mL | Freq: Once | INTRAVENOUS | Status: AC
  Administered 2021-07-07: 10 mL

## 2021-07-07 MED ORDER — HEPARIN SOD (PORK) LOCK FLUSH 100 UNIT/ML IV SOLN
500.0000 [IU] | Freq: Once | INTRAVENOUS | Status: AC
  Administered 2021-07-07: 500 [IU]

## 2021-07-07 NOTE — Progress Notes (Signed)
  Radiation Oncology         [phone]) [phone] ________________________________  Name: Richard Santos MRN: [id number]  Date of Service: 07/07/2021  DOB: 17-Sep-1961  Post Treatment Telephone Note  Diagnosis:   Extensive Stage Small Cell Lung Cancer.  Interval Since Last Radiation:  4 weeks   05/15/2021 through 06/09/2021 Site Technique Total Dose (Gy) Dose per Fx (Gy) Completed Fx Beam Energies  Adrenal Gland, Left: Kidney_Lt 3D 37.5/37.5 2.5 15/15 15X    Narrative:  The patient was contacted today for routine follow-up. During treatment he did very well with radiotherapy and did not have significant desquamation. He reports he is doing really well now that he's been able to eat more and has been out of the hospital. He denies any bleeding episodes from any sites, but his plt count was 27K today that he saw in his mychart. He's about 1 1/2 weeks from his last chemo infusion.  Impression/Plan: 1. Extensive Stage Small Cell Lung Cancer. The patient has been doing well since completion of radiotherapy. We discussed that we will continue to follow him in the brain oncology program and he will be due for an MRI in about 2 months, but he will also continue to follow up with Dr. Julien Nordmann in medical oncology. I did review bleeding precautions and will follow up with med onc to see how they'd like to follow him.      Carola Rhine, PAC

## 2021-07-08 ENCOUNTER — Other Ambulatory Visit: Payer: Self-pay | Admitting: Radiation Therapy

## 2021-07-08 ENCOUNTER — Other Ambulatory Visit: Payer: 59

## 2021-07-08 DIAGNOSIS — C7931 Secondary malignant neoplasm of brain: Secondary | ICD-10-CM

## 2021-07-08 NOTE — Progress Notes (Signed)
Order placed for port access the day of brain MRI at [facility].   Mont Dutton R.T.(R)(T)

## 2021-07-15 ENCOUNTER — Other Ambulatory Visit: Payer: Self-pay | Admitting: Medical Oncology

## 2021-07-15 ENCOUNTER — Inpatient Hospital Stay: Payer: 59

## 2021-07-15 ENCOUNTER — Inpatient Hospital Stay (HOSPITAL_BASED_OUTPATIENT_CLINIC_OR_DEPARTMENT_OTHER): Payer: 59 | Admitting: Internal Medicine

## 2021-07-15 ENCOUNTER — Other Ambulatory Visit: Payer: Self-pay | Admitting: Physician Assistant

## 2021-07-15 ENCOUNTER — Other Ambulatory Visit: Payer: Self-pay

## 2021-07-15 ENCOUNTER — Other Ambulatory Visit: Payer: 59

## 2021-07-15 ENCOUNTER — Encounter: Payer: Self-pay | Admitting: Internal Medicine

## 2021-07-15 ENCOUNTER — Other Ambulatory Visit: Payer: Self-pay | Admitting: Internal Medicine

## 2021-07-15 VITALS — BP 96/66 | HR 110 | Temp 97.6°F | Resp 18 | Ht 72.0 in | Wt 142.2 lb

## 2021-07-15 VITALS — HR 88

## 2021-07-15 DIAGNOSIS — Z95828 Presence of other vascular implants and grafts: Secondary | ICD-10-CM

## 2021-07-15 DIAGNOSIS — E871 Hypo-osmolality and hyponatremia: Secondary | ICD-10-CM | POA: Diagnosis not present

## 2021-07-15 DIAGNOSIS — Z5111 Encounter for antineoplastic chemotherapy: Secondary | ICD-10-CM | POA: Diagnosis not present

## 2021-07-15 DIAGNOSIS — D649 Anemia, unspecified: Secondary | ICD-10-CM

## 2021-07-15 DIAGNOSIS — C3412 Malignant neoplasm of upper lobe, left bronchus or lung: Secondary | ICD-10-CM

## 2021-07-15 DIAGNOSIS — C3490 Malignant neoplasm of unspecified part of unspecified bronchus or lung: Secondary | ICD-10-CM

## 2021-07-15 DIAGNOSIS — C7931 Secondary malignant neoplasm of brain: Secondary | ICD-10-CM

## 2021-07-15 LAB — CBC WITH DIFFERENTIAL (CANCER CENTER ONLY)
Abs Immature Granulocytes: 0.35 10*3/uL — ABNORMAL HIGH (ref 0.00–0.07)
Basophils Absolute: 0.1 10*3/uL (ref 0.0–0.1)
Basophils Relative: 1 %
Eosinophils Absolute: 0 10*3/uL (ref 0.0–0.5)
Eosinophils Relative: 0 %
HCT: 22.6 % — ABNORMAL LOW (ref 39.0–52.0)
Hemoglobin: 8 g/dL — ABNORMAL LOW (ref 13.0–17.0)
Immature Granulocytes: 3 %
Lymphocytes Relative: 5 %
Lymphs Abs: 0.5 10*3/uL — ABNORMAL LOW (ref 0.7–4.0)
MCH: 34.5 pg — ABNORMAL HIGH (ref 26.0–34.0)
MCHC: 35.4 g/dL (ref 30.0–36.0)
MCV: 97.4 fL (ref 80.0–100.0)
Monocytes Absolute: 0.9 10*3/uL (ref 0.1–1.0)
Monocytes Relative: 9 %
Neutro Abs: 8.3 10*3/uL — ABNORMAL HIGH (ref 1.7–7.7)
Neutrophils Relative %: 82 %
Platelet Count: 162 10*3/uL (ref 150–400)
RBC: 2.32 MIL/uL — ABNORMAL LOW (ref 4.22–5.81)
RDW: 13.7 % (ref 11.5–15.5)
WBC Count: 10.2 10*3/uL (ref 4.0–10.5)
nRBC: 0 % (ref 0.0–0.2)

## 2021-07-15 LAB — CMP (CANCER CENTER ONLY)
ALT: 16 U/L (ref 0–44)
AST: 23 U/L (ref 15–41)
Albumin: 3.3 g/dL — ABNORMAL LOW (ref 3.5–5.0)
Alkaline Phosphatase: 109 U/L (ref 38–126)
Anion gap: 10 (ref 5–15)
BUN: 8 mg/dL (ref 6–20)
CO2: 25 mmol/L (ref 22–32)
Calcium: 9.3 mg/dL (ref 8.9–10.3)
Chloride: 102 mmol/L (ref 98–111)
Creatinine: 0.84 mg/dL (ref 0.61–1.24)
GFR, Estimated: >60 mL/min (ref >60)
Glucose, Bld: 135 mg/dL — ABNORMAL HIGH (ref 70–99)
Potassium: 3.9 mmol/L (ref 3.5–5.1)
Sodium: 137 mmol/L (ref 135–145)
Total Bilirubin: 0.3 mg/dL (ref 0.3–1.2)
Total Protein: 7 g/dL (ref 6.5–8.1)

## 2021-07-15 LAB — PREPARE RBC (CROSSMATCH)

## 2021-07-15 MED ORDER — PALONOSETRON HCL INJECTION 0.25 MG/5ML
0.2500 mg | Freq: Once | INTRAVENOUS | Status: AC
  Administered 2021-07-15: 0.25 mg via INTRAVENOUS
  Filled 2021-07-15: qty 5

## 2021-07-15 MED ORDER — SODIUM CHLORIDE 0.9 % IV SOLN
100.0000 mg/m2 | Freq: Once | INTRAVENOUS | Status: AC
  Administered 2021-07-15: 180 mg via INTRAVENOUS
  Filled 2021-07-15: qty 9

## 2021-07-15 MED ORDER — DIPHENHYDRAMINE HCL 25 MG PO CAPS
50.0000 mg | ORAL_CAPSULE | Freq: Once | ORAL | Status: AC
  Administered 2021-07-15: 50 mg via ORAL
  Filled 2021-07-15: qty 2

## 2021-07-15 MED ORDER — SODIUM CHLORIDE 0.9 % IV SOLN
10.0000 mg | Freq: Once | INTRAVENOUS | Status: AC
  Administered 2021-07-15: 10 mg via INTRAVENOUS
  Filled 2021-07-15: qty 10

## 2021-07-15 MED ORDER — HEPARIN SOD (PORK) LOCK FLUSH 100 UNIT/ML IV SOLN
500.0000 [IU] | Freq: Once | INTRAVENOUS | Status: AC | PRN
  Administered 2021-07-15: 500 [IU]

## 2021-07-15 MED ORDER — SODIUM CHLORIDE 0.9 % IV SOLN
573.0000 mg | Freq: Once | INTRAVENOUS | Status: AC
  Administered 2021-07-15: 570 mg via INTRAVENOUS
  Filled 2021-07-15: qty 57

## 2021-07-15 MED ORDER — SODIUM CHLORIDE 0.9 % IV SOLN: Freq: Once | INTRAVENOUS | Status: AC

## 2021-07-15 MED ORDER — SODIUM CHLORIDE 0.9 % IV SOLN
150.0000 mg | Freq: Once | INTRAVENOUS | Status: AC
  Administered 2021-07-15: 150 mg via INTRAVENOUS
  Filled 2021-07-15: qty 150

## 2021-07-15 MED ORDER — SODIUM CHLORIDE 0.9% FLUSH
10.0000 mL | Freq: Once | INTRAVENOUS | Status: AC
  Administered 2021-07-15: 10 mL

## 2021-07-15 MED ORDER — SODIUM CHLORIDE 0.9% FLUSH
10.0000 mL | INTRAVENOUS | Status: DC | PRN
  Administered 2021-07-15: 10 mL

## 2021-07-15 MED ORDER — FAMOTIDINE 20 MG IN NS 100 ML IVPB
20.0000 mg | Freq: Once | INTRAVENOUS | Status: AC
  Administered 2021-07-15: 20 mg via INTRAVENOUS
  Filled 2021-07-15: qty 100

## 2021-07-15 MED ORDER — TRILACICLIB DIHYDROCHLORIDE INJECTION 300 MG
240.0000 mg/m2 | Freq: Once | INTRAVENOUS | Status: AC
  Administered 2021-07-15: 435 mg via INTRAVENOUS
  Filled 2021-07-15: qty 29

## 2021-07-15 NOTE — Progress Notes (Signed)
OK to proceed w/ Cosela today.  PA team submitted auth and it is under review.  Kennith Center, Pharm.D., CPP 07/15/2021@11 :29 AM

## 2021-07-15 NOTE — Progress Notes (Signed)
Per Dr. Julien Nordmann ,it is okay to treat pt today with Carboplatin and VP-16 and hgb of 8 and pulse of 110.

## 2021-07-15 NOTE — Patient Instructions (Signed)
Steps to Quit Smoking Smoking tobacco is the leading cause of preventable death. It can affect almost every organ in the body. Smoking puts you and people around you at risk for many serious, long-lasting (chronic) diseases. Quitting smoking can be hard, but it is one of the best things that you can do for your health. It is never too late to quit. How do I get ready to quit? When you decide to quit smoking, make a plan to help you succeed. Before you quit: Pick a date to quit. Set a date within the next 2 weeks to give you time to prepare. Write down the reasons why you are quitting. Keep this list in places where you will see it often. Tell your family, friends, and co-workers that you are quitting. Their support is important. Talk with your doctor about the choices that may help you quit. Find out if your health insurance will pay for these treatments. Know the people, places, things, and activities that make you want to smoke (triggers). Avoid them. What first steps can I take to quit smoking? Throw away all cigarettes at home, at work, and in your car. Throw away the things that you use when you smoke, such as ashtrays and lighters. Clean your car. Make sure to empty the ashtray. Clean your home, including curtains and carpets. What can I do to help me quit smoking? Talk with your doctor about taking medicines and seeing a counselor at the same time. You are more likely to succeed when you do both. If you are pregnant or breastfeeding, talk with your doctor about counseling or other ways to quit smoking. Do not take medicine to help you quit smoking unless your doctor tells you to do so. To quit smoking: Quit right away Quit smoking totally, instead of slowly cutting back on how much you smoke over a period of time. Go to counseling. You are more likely to quit if you go to counseling sessions regularly. Take medicine You may take medicines to help you quit. Some medicines need a  prescription, and some you can buy over-the-counter. Some medicines may contain a drug called nicotine to replace the nicotine in cigarettes. Medicines may: Help you to stop having the desire to smoke (cravings). Help to stop the problems that come when you stop smoking (withdrawal symptoms). Your doctor may ask you to use: Nicotine patches, gum, or lozenges. Nicotine inhalers or sprays. Non-nicotine medicine that is taken by mouth. Find resources Find resources and other ways to help you quit smoking and remain smoke-free after you quit. These resources are most helpful when you use them often. They include: Online chats with a counselor. Phone quitlines. Printed self-help materials. Support groups or group counseling. Text messaging programs. Mobile phone apps. Use apps on your mobile phone or tablet that can help you stick to your quit plan. There are many free apps for mobile phones and tablets as well as websites. Examples include Quit Guide from the CDC and smokefree.gov  What things can I do to make it easier to quit?  Talk to your family and friends. Ask them to support and encourage you. Call a phone quitline (1-800-QUIT-NOW), reach out to support groups, or work with a counselor. Ask people who smoke to not smoke around you. Avoid places that make you want to smoke, such as: Bars. Parties. Smoke-break areas at work. Spend time with people who do not smoke. Lower the stress in your life. Stress can make you want to   smoke. Try these things to help your stress: Getting regular exercise. Doing deep-breathing exercises. Doing yoga. Meditating. Doing a body scan. To do this, close your eyes, focus on one area of your body at a time from head to toe. Notice which parts of your body are tense. Try to relax the muscles in those areas. How will I feel when I quit smoking? Day 1 to 3 weeks Within the first 24 hours, you may start to have some problems that come from quitting tobacco.  These problems are very bad 2-3 days after you quit, but they do not often last for more than 2-3 weeks. You may get these symptoms: Mood swings. Feeling restless, nervous, angry, or annoyed. Trouble concentrating. Dizziness. Strong desire for high-sugar foods and nicotine. Weight gain. Trouble pooping (constipation). Feeling like you may vomit (nausea). Coughing or a sore throat. Changes in how the medicines that you take for other issues work in your body. Depression. Trouble sleeping (insomnia). Week 3 and afterward After the first 2-3 weeks of quitting, you may start to notice more positive results, such as: Better sense of smell and taste. Less coughing and sore throat. Slower heart rate. Lower blood pressure. Clearer skin. Better breathing. Fewer sick days. Quitting smoking can be hard. Do not give up if you fail the first time. Some people need to try a few times before they succeed. Do your best to stick to your quit plan, and talk with your doctor if you have any questions or concerns. Summary Smoking tobacco is the leading cause of preventable death. Quitting smoking can be hard, but it is one of the best things that you can do for your health. When you decide to quit smoking, make a plan to help you succeed. Quit smoking right away, not slowly over a period of time. When you start quitting, seek help from your doctor, family, or friends. This information is not intended to replace advice given to you by your health care provider. Make sure you discuss any questions you have with your health care provider. Document Revised: 08/04/2019 Document Reviewed: 01/28/2019 Elsevier Patient Education  2022 Elsevier Inc.  

## 2021-07-15 NOTE — Patient Instructions (Signed)
[facility]  Discharge Instructions: Thank you for choosing [facility] to provide your oncology and hematology care.   If you have a lab appointment with the [facility], please go directly to the [facility] and check in at the registration area.   Wear comfortable clothing and clothing appropriate for easy access to any Portacath or PICC line.   We strive to give you quality time with your provider. You may need to reschedule your appointment if you arrive late (15 or more minutes).  Arriving late affects you and other patients whose appointments are after yours.  Also, if you miss three or more appointments without notifying the office, you may be dismissed from the clinic at the provider's discretion.      For prescription refill requests, have your pharmacy contact our office and allow 72 hours for refills to be completed.    Today you received the following chemotherapy and/or immunotherapy agents Carboplatin and etoposide      To help prevent nausea and vomiting after your treatment, we encourage you to take your nausea medication as directed.  BELOW ARE SYMPTOMS THAT SHOULD BE REPORTED IMMEDIATELY: *FEVER GREATER THAN 100.4 F (38 C) OR HIGHER *CHILLS OR SWEATING *NAUSEA AND VOMITING THAT IS NOT CONTROLLED WITH YOUR NAUSEA MEDICATION *UNUSUAL SHORTNESS OF BREATH *UNUSUAL BRUISING OR BLEEDING *URINARY PROBLEMS (pain or burning when urinating, or frequent urination) *BOWEL PROBLEMS (unusual diarrhea, constipation, pain near the anus) TENDERNESS IN MOUTH AND THROAT WITH OR WITHOUT PRESENCE OF ULCERS (sore throat, sores in mouth, or a toothache) UNUSUAL RASH, SWELLING OR PAIN  UNUSUAL VAGINAL DISCHARGE OR ITCHING   Items with * indicate a potential emergency and should be followed up as soon as possible or go to the Emergency Department if any problems should occur.  Please show the CHEMOTHERAPY ALERT CARD or IMMUNOTHERAPY ALERT CARD  at check-in to the Emergency Department and triage nurse.  Should you have questions after your visit or need to cancel or reschedule your appointment, please contact [facility]  Dept: [phone]  and follow the prompts.  Office hours are 8:00 a.m. to 4:30 p.m. Monday - Friday. Please note that voicemails left after 4:00 p.m. may not be returned until the following business day.  We are closed weekends and major holidays. You have access to a nurse at all times for urgent questions. Please call the main number to the clinic Dept: [phone] and follow the prompts.   For any non-urgent questions, you may also contact your provider using MyChart. We now offer e-Visits for anyone 61 and older to request care online for non-urgent symptoms. For details visit [email or web address].   Also download the MyChart app! Go to the app store, search "MyChart", open the app, select [facility], and log in with your MyChart username and password.  Due to Covid, a mask is required upon entering the hospital/clinic. If you do not have a mask, one will be given to you upon arrival. For doctor visits, patients may have 1 support person aged 69 or older with them. For treatment visits, patients cannot have anyone with them due to current Covid guidelines and our immunocompromised population.

## 2021-07-15 NOTE — Progress Notes (Signed)
[place] [phone]   [phone]  OFFICE PROGRESS NOTE  [name], MD [place] [address]  DIAGNOSIS: Extensive stage (T2b, N2, M1c) small cell lung cancer presented with left upper lobe pulmonary nodule in addition to left hilar mass with mediastinal invasion and solitary brain metastasis diagnosed in May 2021  PRIOR THERAPY:  1) Palliative radiation to the left hilar and mediastinal lymphadenopathy under the care of Dr. Lisbeth Renshaw. 2) Cranial irradiation from 08/26/20-09/06/20 under the care of Dr. Lisbeth Renshaw 3) Palliative systemic chemotherapy with carboplatin for AUC of 5 on day 1, etoposide 100 mg/M2 on days 1, 2 and 3 as well as Imfinzi 1500 mg IV every 3 weeks with the chemotherapy.  First dose of chemotherapy May 06, 2020.  The patient will receive treatment with Cosela on the days of his chemotherapy.  Status post 14 cycles.  Starting from cycle #5 he started maintenance immunotherapy with Imfinzi 1500 mg IV every 4 weeks. Discontinued due to disease progression. Last dose 05/29/21.    CURRENT THERAPY: Carboplatin for an AUC of 5 and etoposide 100 mg/M2 on days 1, 2, and 3 IV every 3 weeks Neulasta or Cosela.  First dose expected on 06/24/2021.  Status post 1 cycle.  INTERVAL HISTORY: Richard Santos 60 y.o. male returns to the clinic today for follow-up visit accompanied by his wife.  The patient is complaining of increasing fatigue and weakness as well as shortness of breath with exertion.  He is also congested.  He got exhausted walking from the car to the cancer center.  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  He denied having any chest pain, cough or hemoptysis.  He denied having any fever or chills.  He has no nausea, vomiting, diarrhea or constipation.  He has no headache or visual changes.  He tolerated the first cycle of his treatment with carboplatin and Doutova  site fairly well.  The patient is here today for evaluation before starting cycle #2.  MEDICAL HISTORY: Past Medical History:  Diagnosis Date   Allergic rhinitis    Allergy    Anxiety    Chronic low back pain    DDD (degenerative disc disease), lumbar    ED (erectile dysfunction)    Finger injury    cut pads off 3rd and 4th finger left hand/due to lawn mower accident   GERD (gastroesophageal reflux disease)    Heart murmur    as child only-    History of kidney stones    HTN (hypertension) 08/20/2016   Hyperlipidemia    Incomplete right bundle branch block    Prostate cancer ([facility]) dx 10/02/14   stage T1c   Sigmoid diverticulosis    Small cell lung cancer ([facility]) 03/2020   Wears glasses     ALLERGIES:  is allergic to bee venom, elemental sulfur, other, sulfa antibiotics, and namenda [memantine].  MEDICATIONS:  Current Outpatient Medications  Medication Sig Dispense Refill   acetaminophen (TYLENOL) 325 MG tablet Take 2 tablets (650 mg total) by mouth every 6 (six) hours as needed for mild pain (or Fever >/= 101). 30 tablet 0   aspirin EC 81 MG tablet Take 81 mg by mouth daily. Swallow whole.     BREZTRI AEROSPHERE 160-9-4.8 MCG/ACT AERO INHALE 2 PUFFS INTO THE LUNGS IN THE MORNING AND AT BEDTIME. 10.7 g 10   famotidine (PEPCID) 20 MG  tablet Take 1 tablet (20 mg total) by mouth 2 (two) times daily. 60 tablet 2   furosemide (LASIX) 20 MG tablet Take 1 tablet (20 mg total) by mouth daily. 30 tablet 0   Multiple Vitamin (MULTIVITAMIN) capsule Take 1 capsule by mouth daily.     ondansetron (ZOFRAN-ODT) 8 MG disintegrating tablet Take 1 tablet (8 mg total) by mouth every 8 (eight) hours as needed for nausea or vomiting. 30 tablet 2   pantoprazole (PROTONIX) 40 MG tablet Take 40 mg by mouth every morning.     prochlorperazine (COMPAZINE) 10 MG tablet Take 1 tablet (10 mg total) by mouth every 6 (six) hours as needed. 30 tablet 2   sodium chloride 1 g tablet Take 1 tablet (1 g total) by  mouth 2 (two) times daily with a meal. 60 tablet 0   tolvaptan (SAMSCA) 15 MG TABS tablet Take 15 mg by mouth daily. Per pts email -he does not have it yet from [facility]     traMADol (ULTRAM) 50 MG tablet TAKE 1/2 TABLET BY MOUTH TWICE A DAY AS NEEDED FOR PAIN 60 tablet 2   No current facility-administered medications for this visit.    SURGICAL HISTORY:  Past Surgical History:  Procedure Laterality Date   BRONCHIAL BIOPSY  04/12/2020   Procedure: BRONCHIAL BIOPSIES;  Surgeon: Marshell Garfinkel, MD;  Location: [facility];  Service: Cardiopulmonary;;   BRONCHIAL BRUSHINGS  04/12/2020   Procedure: BRONCHIAL BRUSHINGS;  Surgeon: Marshell Garfinkel, MD;  Location: [facility];  Service: Cardiopulmonary;;   BRONCHIAL NEEDLE ASPIRATION BIOPSY  04/12/2020   Procedure: BRONCHIAL NEEDLE ASPIRATION BIOPSIES;  Surgeon: Marshell Garfinkel, MD;  Location: [facility];  Service: Cardiopulmonary;;   BRONCHIAL WASHINGS  04/12/2020   Procedure: BRONCHIAL WASHINGS;  Surgeon: Marshell Garfinkel, MD;  Location: [facility];  Service: Cardiopulmonary;;   COLONOSCOPY     COLONOSCOPY W/ POLYPECTOMY  04-19-2012   ENDOBRONCHIAL ULTRASOUND N/A 04/12/2020   Procedure: ENDOBRONCHIAL ULTRASOUND;  Surgeon: Marshell Garfinkel, MD;  Location: [facility];  Service: Cardiopulmonary;  Laterality: N/A;   ESOPHAGOGASTRODUODENOSCOPY  08-05-2010   HEMOSTASIS CONTROL  04/12/2020   Procedure: HEMOSTASIS CONTROL;  Surgeon: Marshell Garfinkel, MD;  Location: [facility];  Service: Cardiopulmonary;;   IR IMAGING GUIDED PORT INSERTION  05/31/2020   POLYPECTOMY     PROSTATE BIOPSY  10/02/14   RADIOACTIVE SEED IMPLANT N/A 01/30/2015   Procedure: RADIOACTIVE SEED IMPLANT;  Surgeon: Rana Snare, MD;  Location: [facility];  Service: Urology;  Laterality: N/A;   UPPER GASTROINTESTINAL ENDOSCOPY  04/03/2021   VIDEO BRONCHOSCOPY N/A 04/12/2020   Procedure: VIDEO BRONCHOSCOPY WITHOUT FLUORO;  Surgeon: Marshell Garfinkel, MD;  Location:  [facility];  Service: Cardiopulmonary;  Laterality: N/A;    REVIEW OF SYSTEMS:  Constitutional: positive for fatigue Eyes: negative Ears, nose, mouth, throat, and face: negative Respiratory: positive for dyspnea on exertion Cardiovascular: negative Gastrointestinal: negative Genitourinary:negative Integument/breast: negative Hematologic/lymphatic: negative Musculoskeletal:positive for muscle weakness Neurological: negative Behavioral/Psych: negative Endocrine: negative Allergic/Immunologic: negative   PHYSICAL EXAMINATION: General appearance: alert, cooperative, fatigued, and no distress Head: Normocephalic, without obvious abnormality, atraumatic Neck: no adenopathy, no JVD, supple, symmetrical, trachea midline, and thyroid not enlarged, symmetric, no tenderness/mass/nodules Lymph nodes: Cervical, supraclavicular, and axillary nodes normal. Resp: clear to auscultation bilaterally Back: symmetric, no curvature. ROM normal. No CVA tenderness. Cardio: regular rate and rhythm, S1, S2 normal, no murmur, click, rub or gallop GI: soft, non-tender; bowel sounds normal; no masses,  no organomegaly Extremities: extremities normal, atraumatic, no cyanosis or edema  Neurologic: Alert and oriented X 3, normal strength and tone. Normal symmetric reflexes. Normal coordination and gait  ECOG PERFORMANCE STATUS: 1 - Symptomatic but completely ambulatory  Blood pressure 96/66, pulse (!) 110, temperature 97.6 F (36.4 C), temperature source Tympanic, resp. rate 18, height 6' (1.829 m), weight 142 lb 3.2 oz (64.5 kg), SpO2 100 %.  LABORATORY DATA: Lab Results  Component Value Date   WBC 10.2 07/15/2021   HGB 8.0 (L) 07/15/2021   HCT 22.6 (L) 07/15/2021   MCV 97.4 07/15/2021   PLT 162 07/15/2021      Chemistry      Component Value Date/Time   NA 137 07/07/2021 1139   K 4.1 07/07/2021 1139   CL 101 07/07/2021 1139   CO2 25 07/07/2021 1139   BUN 14 07/07/2021 1139   CREATININE 0.79  07/07/2021 1139      Component Value Date/Time   CALCIUM 9.4 07/07/2021 1139   ALKPHOS 90 07/07/2021 1139   AST 24 07/07/2021 1139   ALT 20 07/07/2021 1139   BILITOT <0.2 (L) 07/07/2021 1139       RADIOGRAPHIC STUDIES: No results found.   ASSESSMENT AND PLAN: This is a very pleasant 60 years old white male recently diagnosed with extensive stage small cell lung cancer presented with left upper lobe lung mass in addition to left hilar and mediastinal lymphadenopathy as well as solitary brain metastasis diagnosed in May 2021. He underwent palliative radiotherapy to the left hilar and mediastinal lymphadenopathy in addition to the radiotherapy to the brain metastasis.  He continues to have residual odynophagia and dysphagia from the palliative radiotherapy. He is currently undergoing systemic chemotherapy with carboplatin for AUC of 5 on day 1, etoposide 100 mg/M2 on days 1, 2 and 3 with Cosela infusion before chemotherapy.  He is status post 14 cycles of treatment  Starting from cycle #5 the patient is on treatment with maintenance Imfinzi every 4 weeks. 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  His treatment was discontinued. He started treatment with systemic chemotherapy again with carboplatin for AUC of 5 on day 1 and 2 etoposide 100 Mg/M2 on days, 1, 2 and 3 every 3 weeks with Cosela status post 1 cycle. The patient tolerated the first cycle of his treatment well with no concerning adverse effect except for fatigue. I recommended for him to proceed with cycle #2 today as planned. For the chemotherapy-induced anemia, I will arrange for the patient to receive 2 units of PRBCs transfusion. For the hyponatremia from SIADH, the patient will continue with the salt tablet for now.  There was improvement in his serum sodium on the last blood work a week ago.  We will continue to monitor closely. For the  history of dysphagia, he will see Dr. Carlean Purl for evaluation and consideration of esophageal dilatation. The patient will come back for follow-up visit in 3 weeks for evaluation with repeat CT scan of the chest, abdomen pelvis for restaging of his disease. The patient was advised to call immediately if he has any concerning symptoms in the interval. The patient voices understanding of current disease status and treatment options and is in agreement with the current care plan.  All questions were answered. The patient knows to call the clinic with any problems, questions or concerns. We can certainly see the patient much sooner if necessary.   Disclaimer: This note was dictated with voice recognition software. Similar sounding words  can inadvertently be transcribed and may not be corrected upon review.

## 2021-07-15 NOTE — Progress Notes (Signed)
Transfusion orders released.

## 2021-07-16 ENCOUNTER — Inpatient Hospital Stay: Payer: 59

## 2021-07-16 VITALS — BP 111/72 | HR 84 | Temp 98.2°F | Resp 16

## 2021-07-16 DIAGNOSIS — Z5111 Encounter for antineoplastic chemotherapy: Secondary | ICD-10-CM | POA: Diagnosis not present

## 2021-07-16 DIAGNOSIS — D649 Anemia, unspecified: Secondary | ICD-10-CM

## 2021-07-16 DIAGNOSIS — C3412 Malignant neoplasm of upper lobe, left bronchus or lung: Secondary | ICD-10-CM

## 2021-07-16 MED ORDER — TRILACICLIB DIHYDROCHLORIDE INJECTION 300 MG
240.0000 mg/m2 | Freq: Once | INTRAVENOUS | Status: AC
  Administered 2021-07-16: 435 mg via INTRAVENOUS
  Filled 2021-07-16: qty 29

## 2021-07-16 MED ORDER — SODIUM CHLORIDE 0.9% FLUSH
10.0000 mL | INTRAVENOUS | Status: AC | PRN
  Administered 2021-07-16: 10 mL

## 2021-07-16 MED ORDER — HEPARIN SOD (PORK) LOCK FLUSH 100 UNIT/ML IV SOLN: 500.0000 [IU] | Freq: Every day | INTRAVENOUS | Status: AC | PRN

## 2021-07-16 MED ORDER — DIPHENHYDRAMINE HCL 25 MG PO CAPS
25.0000 mg | ORAL_CAPSULE | Freq: Once | ORAL | Status: AC
  Administered 2021-07-16: 25 mg via ORAL
  Filled 2021-07-16: qty 1

## 2021-07-16 MED ORDER — ACETAMINOPHEN 325 MG PO TABS
650.0000 mg | ORAL_TABLET | Freq: Once | ORAL | Status: AC
  Administered 2021-07-16: 650 mg via ORAL
  Filled 2021-07-16: qty 2

## 2021-07-16 MED ORDER — SODIUM CHLORIDE 0.9 % IV SOLN
100.0000 mg/m2 | Freq: Once | INTRAVENOUS | Status: AC
  Administered 2021-07-16: 180 mg via INTRAVENOUS
  Filled 2021-07-16: qty 9

## 2021-07-16 MED ORDER — SODIUM CHLORIDE 0.9% FLUSH: 10.0000 mL | INTRAVENOUS | Status: AC | PRN

## 2021-07-16 MED ORDER — SODIUM CHLORIDE 0.9% IV SOLUTION
250.0000 mL | Freq: Once | INTRAVENOUS | Status: AC
  Administered 2021-07-16: 250 mL via INTRAVENOUS

## 2021-07-16 MED ORDER — SODIUM CHLORIDE 0.9 % IV SOLN
10.0000 mg | Freq: Once | INTRAVENOUS | Status: AC
  Administered 2021-07-16: 10 mg via INTRAVENOUS
  Filled 2021-07-16: qty 10

## 2021-07-16 MED ORDER — SODIUM CHLORIDE 0.9 % IV SOLN: Freq: Once | INTRAVENOUS | Status: AC

## 2021-07-16 MED ORDER — HEPARIN SOD (PORK) LOCK FLUSH 100 UNIT/ML IV SOLN
500.0000 [IU] | Freq: Once | INTRAVENOUS | Status: AC | PRN
  Administered 2021-07-16: 500 [IU]

## 2021-07-16 MED FILL — Dexamethasone Sodium Phosphate Inj 100 MG/10ML: INTRAMUSCULAR | Qty: 1 | Status: AC

## 2021-07-16 NOTE — Patient Instructions (Signed)
[facility]  Discharge Instructions: Thank you for choosing [facility] to provide your oncology and hematology care.   If you have a lab appointment with the Cancer Center, please go directly to the Cancer Center and check in at the registration area.   Wear comfortable clothing and clothing appropriate for easy access to any Portacath or PICC line.   We strive to give you quality time with your provider. You may need to reschedule your appointment if you arrive late (15 or more minutes).  Arriving late affects you and other patients whose appointments are after yours.  Also, if you miss three or more appointments without notifying the office, you may be dismissed from the clinic at the provider's discretion.      For prescription refill requests, have your pharmacy contact our office and allow 72 hours for refills to be completed.    Today you received the following chemotherapy and/or immunotherapy agents: etoposide.     To help prevent nausea and vomiting after your treatment, we encourage you to take your nausea medication as directed.  BELOW ARE SYMPTOMS THAT SHOULD BE REPORTED IMMEDIATELY: . *FEVER GREATER THAN 100.4 F (38 C) OR HIGHER . *CHILLS OR SWEATING . *NAUSEA AND VOMITING THAT IS NOT CONTROLLED WITH YOUR NAUSEA MEDICATION . *UNUSUAL SHORTNESS OF BREATH . *UNUSUAL BRUISING OR BLEEDING . *URINARY PROBLEMS (pain or burning when urinating, or frequent urination) . *BOWEL PROBLEMS (unusual diarrhea, constipation, pain near the anus) . TENDERNESS IN MOUTH AND THROAT WITH OR WITHOUT PRESENCE OF ULCERS (sore throat, sores in mouth, or a toothache) . UNUSUAL RASH, SWELLING OR PAIN  . UNUSUAL VAGINAL DISCHARGE OR ITCHING   Items with * indicate a potential emergency and should be followed up as soon as possible or go to the Emergency Department if any problems should occur.  Please show the CHEMOTHERAPY ALERT CARD or IMMUNOTHERAPY ALERT  CARD at check-in to the Emergency Department and triage nurse.  Should you have questions after your visit or need to cancel or reschedule your appointment, please contact [facility]  Dept: [phone]  and follow the prompts.  Office hours are 8:00 a.m. to 4:30 p.m. Monday - Friday. Please note that voicemails left after 4:00 p.m. may not be returned until the following business day.  We are closed weekends and major holidays. You have access to a nurse at all times for urgent questions. Please call the main number to the clinic Dept: [phone] and follow the prompts.   For any non-urgent questions, you may also contact your provider using MyChart. We now offer e-Visits for anyone 18 and older to request care online for non-urgent symptoms. For details visit [email or web address].   Also download the MyChart app! Go to the app store, search "MyChart", open the app, select [facility], and log in with your MyChart username and password.  Due to Covid, a mask is required upon entering the hospital/clinic. If you do not have a mask, one will be given to you upon arrival. For doctor visits, patients may have 1 support person aged 18 or older with them. For treatment visits, patients cannot have anyone with them due to current Covid guidelines and our immunocompromised population.   

## 2021-07-17 ENCOUNTER — Other Ambulatory Visit: Payer: 59

## 2021-07-17 ENCOUNTER — Ambulatory Visit: Payer: 59

## 2021-07-17 ENCOUNTER — Inpatient Hospital Stay: Payer: 59

## 2021-07-17 ENCOUNTER — Other Ambulatory Visit: Payer: Self-pay

## 2021-07-17 VITALS — BP 115/75 | HR 83 | Temp 98.3°F | Resp 16

## 2021-07-17 DIAGNOSIS — Z5111 Encounter for antineoplastic chemotherapy: Secondary | ICD-10-CM | POA: Diagnosis not present

## 2021-07-17 DIAGNOSIS — C3412 Malignant neoplasm of upper lobe, left bronchus or lung: Secondary | ICD-10-CM

## 2021-07-17 LAB — TYPE AND SCREEN
ABO/RH(D): A NEG
Antibody Screen: NEGATIVE
Unit division: 0
Unit division: 0

## 2021-07-17 LAB — BPAM RBC
Blood Product Expiration Date: 202209142359
Blood Product Expiration Date: 202209172359
ISSUE DATE / TIME: 202208240921
ISSUE DATE / TIME: 202208240921
Unit Type and Rh: 600
Unit Type and Rh: 600

## 2021-07-17 MED ORDER — SODIUM CHLORIDE 0.9 % IV SOLN: Freq: Once | INTRAVENOUS | Status: AC

## 2021-07-17 MED ORDER — HEPARIN SOD (PORK) LOCK FLUSH 100 UNIT/ML IV SOLN
500.0000 [IU] | Freq: Once | INTRAVENOUS | Status: AC | PRN
  Administered 2021-07-17: 500 [IU]

## 2021-07-17 MED ORDER — SODIUM CHLORIDE 0.9 % IV SOLN
100.0000 mg/m2 | Freq: Once | INTRAVENOUS | Status: AC
  Administered 2021-07-17: 180 mg via INTRAVENOUS
  Filled 2021-07-17: qty 9

## 2021-07-17 MED ORDER — TRILACICLIB DIHYDROCHLORIDE INJECTION 300 MG
240.0000 mg/m2 | Freq: Once | INTRAVENOUS | Status: AC
  Administered 2021-07-17: 435 mg via INTRAVENOUS
  Filled 2021-07-17: qty 29

## 2021-07-17 MED ORDER — DEXAMETHASONE SODIUM PHOSPHATE 100 MG/10ML IJ SOLN
10.0000 mg | Freq: Once | INTRAMUSCULAR | Status: AC
  Administered 2021-07-17: 10 mg via INTRAVENOUS
  Filled 2021-07-17: qty 10

## 2021-07-17 MED ORDER — SODIUM CHLORIDE 0.9% FLUSH
10.0000 mL | INTRAVENOUS | Status: DC | PRN
  Administered 2021-07-17: 10 mL

## 2021-07-17 NOTE — Patient Instructions (Signed)
[facility]  Discharge Instructions: Thank you for choosing [facility] to provide your oncology and hematology care.   If you have a lab appointment with the Cancer Center, please go directly to the Cancer Center and check in at the registration area.   Wear comfortable clothing and clothing appropriate for easy access to any Portacath or PICC line.   We strive to give you quality time with your provider. You may need to reschedule your appointment if you arrive late (15 or more minutes).  Arriving late affects you and other patients whose appointments are after yours.  Also, if you miss three or more appointments without notifying the office, you may be dismissed from the clinic at the provider's discretion.      For prescription refill requests, have your pharmacy contact our office and allow 72 hours for refills to be completed.    Today you received the following chemotherapy and/or immunotherapy agents: etoposide.     To help prevent nausea and vomiting after your treatment, we encourage you to take your nausea medication as directed.  BELOW ARE SYMPTOMS THAT SHOULD BE REPORTED IMMEDIATELY: . *FEVER GREATER THAN 100.4 F (38 C) OR HIGHER . *CHILLS OR SWEATING . *NAUSEA AND VOMITING THAT IS NOT CONTROLLED WITH YOUR NAUSEA MEDICATION . *UNUSUAL SHORTNESS OF BREATH . *UNUSUAL BRUISING OR BLEEDING . *URINARY PROBLEMS (pain or burning when urinating, or frequent urination) . *BOWEL PROBLEMS (unusual diarrhea, constipation, pain near the anus) . TENDERNESS IN MOUTH AND THROAT WITH OR WITHOUT PRESENCE OF ULCERS (sore throat, sores in mouth, or a toothache) . UNUSUAL RASH, SWELLING OR PAIN  . UNUSUAL VAGINAL DISCHARGE OR ITCHING   Items with * indicate a potential emergency and should be followed up as soon as possible or go to the Emergency Department if any problems should occur.  Please show the CHEMOTHERAPY ALERT CARD or IMMUNOTHERAPY ALERT  CARD at check-in to the Emergency Department and triage nurse.  Should you have questions after your visit or need to cancel or reschedule your appointment, please contact [facility]  Dept: [phone]  and follow the prompts.  Office hours are 8:00 a.m. to 4:30 p.m. Monday - Friday. Please note that voicemails left after 4:00 p.m. may not be returned until the following business day.  We are closed weekends and major holidays. You have access to a nurse at all times for urgent questions. Please call the main number to the clinic Dept: [phone] and follow the prompts.   For any non-urgent questions, you may also contact your provider using MyChart. We now offer e-Visits for anyone 18 and older to request care online for non-urgent symptoms. For details visit [email or web address].   Also download the MyChart app! Go to the app store, search "MyChart", open the app, select [facility], and log in with your MyChart username and password.  Due to Covid, a mask is required upon entering the hospital/clinic. If you do not have a mask, one will be given to you upon arrival. For doctor visits, patients may have 1 support person aged 18 or older with them. For treatment visits, patients cannot have anyone with them due to current Covid guidelines and our immunocompromised population.   

## 2021-07-18 ENCOUNTER — Other Ambulatory Visit: Payer: Self-pay | Admitting: Physician Assistant

## 2021-07-18 DIAGNOSIS — R110 Nausea: Secondary | ICD-10-CM

## 2021-07-19 ENCOUNTER — Inpatient Hospital Stay: Payer: 59

## 2021-07-21 ENCOUNTER — Encounter: Payer: Self-pay | Admitting: Internal Medicine

## 2021-07-21 ENCOUNTER — Other Ambulatory Visit: Payer: Self-pay

## 2021-07-21 ENCOUNTER — Inpatient Hospital Stay: Payer: 59

## 2021-07-21 DIAGNOSIS — Z5111 Encounter for antineoplastic chemotherapy: Secondary | ICD-10-CM | POA: Diagnosis not present

## 2021-07-21 DIAGNOSIS — D649 Anemia, unspecified: Secondary | ICD-10-CM

## 2021-07-21 DIAGNOSIS — C3412 Malignant neoplasm of upper lobe, left bronchus or lung: Secondary | ICD-10-CM

## 2021-07-21 DIAGNOSIS — Z95828 Presence of other vascular implants and grafts: Secondary | ICD-10-CM

## 2021-07-21 LAB — SAMPLE TO BLOOD BANK

## 2021-07-21 LAB — CMP (CANCER CENTER ONLY)
ALT: 18 U/L (ref 0–44)
AST: 25 U/L (ref 15–41)
Albumin: 3.5 g/dL (ref 3.5–5.0)
Alkaline Phosphatase: 84 U/L (ref 38–126)
Anion gap: 9 (ref 5–15)
BUN: 19 mg/dL (ref 6–20)
CO2: 27 mmol/L (ref 22–32)
Calcium: 9.6 mg/dL (ref 8.9–10.3)
Chloride: 101 mmol/L (ref 98–111)
Creatinine: 0.8 mg/dL (ref 0.61–1.24)
GFR, Estimated: >60 mL/min (ref >60)
Glucose, Bld: 102 mg/dL — ABNORMAL HIGH (ref 70–99)
Potassium: 4.4 mmol/L (ref 3.5–5.1)
Sodium: 137 mmol/L (ref 135–145)
Total Bilirubin: 0.6 mg/dL (ref 0.3–1.2)
Total Protein: 7.1 g/dL (ref 6.5–8.1)

## 2021-07-21 LAB — CBC WITH DIFFERENTIAL (CANCER CENTER ONLY)
Abs Immature Granulocytes: 0.11 10*3/uL — ABNORMAL HIGH (ref 0.00–0.07)
Basophils Absolute: 0 10*3/uL (ref 0.0–0.1)
Basophils Relative: 0 %
Eosinophils Absolute: 0 10*3/uL (ref 0.0–0.5)
Eosinophils Relative: 1 %
HCT: 29.4 % — ABNORMAL LOW (ref 39.0–52.0)
Hemoglobin: 10.1 g/dL — ABNORMAL LOW (ref 13.0–17.0)
Immature Granulocytes: 2 %
Lymphocytes Relative: 6 %
Lymphs Abs: 0.4 10*3/uL — ABNORMAL LOW (ref 0.7–4.0)
MCH: 32.7 pg (ref 26.0–34.0)
MCHC: 34.4 g/dL (ref 30.0–36.0)
MCV: 95.1 fL (ref 80.0–100.0)
Monocytes Absolute: 0.1 10*3/uL (ref 0.1–1.0)
Monocytes Relative: 1 %
Neutro Abs: 5.9 10*3/uL (ref 1.7–7.7)
Neutrophils Relative %: 90 %
Platelet Count: 318 10*3/uL (ref 150–400)
RBC: 3.09 MIL/uL — ABNORMAL LOW (ref 4.22–5.81)
RDW: 14.4 % (ref 11.5–15.5)
WBC Count: 6.5 10*3/uL (ref 4.0–10.5)
nRBC: 0 % (ref 0.0–0.2)

## 2021-07-21 MED ORDER — SODIUM CHLORIDE 0.9% FLUSH
10.0000 mL | Freq: Once | INTRAVENOUS | Status: AC
Start: 2021-07-21 — End: 2021-07-21
  Administered 2021-07-21: 10 mL

## 2021-07-21 MED ORDER — HEPARIN SOD (PORK) LOCK FLUSH 100 UNIT/ML IV SOLN
500.0000 [IU] | Freq: Once | INTRAVENOUS | Status: AC
  Administered 2021-07-21: 500 [IU]

## 2021-07-22 ENCOUNTER — Other Ambulatory Visit: Payer: Self-pay

## 2021-07-22 DIAGNOSIS — C3412 Malignant neoplasm of upper lobe, left bronchus or lung: Secondary | ICD-10-CM

## 2021-07-22 MED ORDER — SODIUM BICARBONATE 650 MG PO TABS: 650.0000 mg | ORAL_TABLET | Freq: Two times a day (BID) | ORAL | 1 refills | Status: DC

## 2021-07-24 ENCOUNTER — Ambulatory Visit: Payer: 59

## 2021-07-24 ENCOUNTER — Ambulatory Visit: Payer: 59 | Admitting: Physician Assistant

## 2021-07-24 ENCOUNTER — Other Ambulatory Visit: Payer: 59

## 2021-07-29 ENCOUNTER — Encounter: Payer: Self-pay | Admitting: Internal Medicine

## 2021-07-29 ENCOUNTER — Other Ambulatory Visit: Payer: Self-pay | Admitting: Physician Assistant

## 2021-07-29 ENCOUNTER — Inpatient Hospital Stay: Payer: 59 | Attending: Internal Medicine

## 2021-07-29 ENCOUNTER — Other Ambulatory Visit: Payer: Self-pay

## 2021-07-29 DIAGNOSIS — I10 Essential (primary) hypertension: Secondary | ICD-10-CM | POA: Insufficient documentation

## 2021-07-29 DIAGNOSIS — D6481 Anemia due to antineoplastic chemotherapy: Secondary | ICD-10-CM | POA: Insufficient documentation

## 2021-07-29 DIAGNOSIS — C3412 Malignant neoplasm of upper lobe, left bronchus or lung: Secondary | ICD-10-CM | POA: Insufficient documentation

## 2021-07-29 DIAGNOSIS — C7931 Secondary malignant neoplasm of brain: Secondary | ICD-10-CM | POA: Diagnosis present

## 2021-07-29 DIAGNOSIS — Z5111 Encounter for antineoplastic chemotherapy: Secondary | ICD-10-CM | POA: Insufficient documentation

## 2021-07-29 DIAGNOSIS — Z79899 Other long term (current) drug therapy: Secondary | ICD-10-CM | POA: Diagnosis not present

## 2021-07-29 DIAGNOSIS — T451X5A Adverse effect of antineoplastic and immunosuppressive drugs, initial encounter: Secondary | ICD-10-CM | POA: Insufficient documentation

## 2021-07-29 DIAGNOSIS — Z8546 Personal history of malignant neoplasm of prostate: Secondary | ICD-10-CM | POA: Insufficient documentation

## 2021-07-29 DIAGNOSIS — Z7982 Long term (current) use of aspirin: Secondary | ICD-10-CM | POA: Diagnosis not present

## 2021-07-29 DIAGNOSIS — E222 Syndrome of inappropriate secretion of antidiuretic hormone: Secondary | ICD-10-CM | POA: Insufficient documentation

## 2021-07-29 DIAGNOSIS — Z95828 Presence of other vascular implants and grafts: Secondary | ICD-10-CM

## 2021-07-29 LAB — CMP (CANCER CENTER ONLY)
ALT: 22 U/L (ref 0–44)
AST: 30 U/L (ref 15–41)
Albumin: 3.5 g/dL (ref 3.5–5.0)
Alkaline Phosphatase: 104 U/L (ref 38–126)
Anion gap: 11 (ref 5–15)
BUN: 12 mg/dL (ref 6–20)
CO2: 26 mmol/L (ref 22–32)
Calcium: 9.5 mg/dL (ref 8.9–10.3)
Chloride: 103 mmol/L (ref 98–111)
Creatinine: 0.83 mg/dL (ref 0.61–1.24)
GFR, Estimated: >60 mL/min (ref >60)
Glucose, Bld: 127 mg/dL — ABNORMAL HIGH (ref 70–99)
Potassium: 4.3 mmol/L (ref 3.5–5.1)
Sodium: 140 mmol/L (ref 135–145)
Total Bilirubin: 0.3 mg/dL (ref 0.3–1.2)
Total Protein: 7.1 g/dL (ref 6.5–8.1)

## 2021-07-29 LAB — CBC WITH DIFFERENTIAL (CANCER CENTER ONLY)
Abs Immature Granulocytes: 0.02 10*3/uL (ref 0.00–0.07)
Basophils Absolute: 0 10*3/uL (ref 0.0–0.1)
Basophils Relative: 1 %
Eosinophils Absolute: 0 10*3/uL (ref 0.0–0.5)
Eosinophils Relative: 1 %
HCT: 23.3 % — ABNORMAL LOW (ref 39.0–52.0)
Hemoglobin: 8.2 g/dL — ABNORMAL LOW (ref 13.0–17.0)
Immature Granulocytes: 1 %
Lymphocytes Relative: 12 %
Lymphs Abs: 0.4 10*3/uL — ABNORMAL LOW (ref 0.7–4.0)
MCH: 32.4 pg (ref 26.0–34.0)
MCHC: 35.2 g/dL (ref 30.0–36.0)
MCV: 92.1 fL (ref 80.0–100.0)
Monocytes Absolute: 0.6 10*3/uL (ref 0.1–1.0)
Monocytes Relative: 18 %
Neutro Abs: 2.5 10*3/uL (ref 1.7–7.7)
Neutrophils Relative %: 67 %
Platelet Count: 70 10*3/uL — ABNORMAL LOW (ref 150–400)
RBC: 2.53 MIL/uL — ABNORMAL LOW (ref 4.22–5.81)
RDW: 14 % (ref 11.5–15.5)
WBC Count: 3.6 10*3/uL — ABNORMAL LOW (ref 4.0–10.5)
nRBC: 0 % (ref 0.0–0.2)

## 2021-07-29 MED ORDER — HEPARIN SOD (PORK) LOCK FLUSH 100 UNIT/ML IV SOLN
500.0000 [IU] | Freq: Once | INTRAVENOUS | Status: AC
  Administered 2021-07-29: 500 [IU]

## 2021-07-29 MED ORDER — SODIUM CHLORIDE 0.9% FLUSH
10.0000 mL | Freq: Once | INTRAVENOUS | Status: AC
  Administered 2021-07-29: 10 mL

## 2021-07-30 ENCOUNTER — Encounter: Payer: Self-pay | Admitting: Internal Medicine

## 2021-07-30 NOTE — Telephone Encounter (Signed)
I spoke with pt regarding his lab results and advised we expect fluctuations in his results given his condition and tx. Pt was also advised this is why we have him complete labs weekly, so he is closely monitored. Pt was advised if his hgb continues to decrease, he may need a transfusion. Pt expressed understanding of this information.

## 2021-08-01 ENCOUNTER — Other Ambulatory Visit: Payer: Self-pay

## 2021-08-01 ENCOUNTER — Ambulatory Visit (HOSPITAL_COMMUNITY)
Admission: RE | Admit: 2021-08-01 | Discharge: 2021-08-01 | Disposition: A | Discharge status: Home / Self Care | Payer: 59 | Source: Ambulatory Visit | Attending: Internal Medicine | Admitting: Internal Medicine

## 2021-08-01 DIAGNOSIS — C3490 Malignant neoplasm of unspecified part of unspecified bronchus or lung: Secondary | ICD-10-CM | POA: Diagnosis not present

## 2021-08-01 MED ORDER — IOHEXOL 350 MG/ML SOLN
100.0000 mL | Freq: Once | INTRAVENOUS | Status: AC | PRN
  Administered 2021-08-01: 80 mL via INTRAVENOUS

## 2021-08-01 MED ORDER — IOHEXOL 9 MG/ML PO SOLN
500.0000 mL | ORAL | Status: AC
Start: 2021-08-01 — End: 2021-08-01
  Administered 2021-08-01 (×2): 500 mL via ORAL

## 2021-08-01 MED ORDER — IOHEXOL 9 MG/ML PO SOLN: 1000.0000 mL | ORAL | Status: DC

## 2021-08-01 MED ORDER — IOHEXOL 9 MG/ML PO SOLN
ORAL | Status: AC
  Filled 2021-08-01: qty 1000

## 2021-08-01 MED ORDER — HEPARIN SOD (PORK) LOCK FLUSH 100 UNIT/ML IV SOLN
500.0000 [IU] | Freq: Once | INTRAVENOUS | Status: AC
  Administered 2021-08-01: 500 [IU] via INTRAVENOUS

## 2021-08-04 ENCOUNTER — Inpatient Hospital Stay: Payer: 59

## 2021-08-04 ENCOUNTER — Encounter: Payer: Self-pay | Admitting: Internal Medicine

## 2021-08-04 ENCOUNTER — Inpatient Hospital Stay (HOSPITAL_BASED_OUTPATIENT_CLINIC_OR_DEPARTMENT_OTHER): Payer: 59 | Admitting: Internal Medicine

## 2021-08-04 ENCOUNTER — Other Ambulatory Visit: Payer: Self-pay

## 2021-08-04 VITALS — BP 103/66 | HR 96 | Temp 95.8°F | Resp 20 | Ht 72.0 in | Wt 143.3 lb

## 2021-08-04 DIAGNOSIS — C3412 Malignant neoplasm of upper lobe, left bronchus or lung: Secondary | ICD-10-CM | POA: Diagnosis not present

## 2021-08-04 DIAGNOSIS — Z95828 Presence of other vascular implants and grafts: Secondary | ICD-10-CM

## 2021-08-04 DIAGNOSIS — T451X5A Adverse effect of antineoplastic and immunosuppressive drugs, initial encounter: Secondary | ICD-10-CM

## 2021-08-04 DIAGNOSIS — E222 Syndrome of inappropriate secretion of antidiuretic hormone: Secondary | ICD-10-CM

## 2021-08-04 DIAGNOSIS — D6481 Anemia due to antineoplastic chemotherapy: Secondary | ICD-10-CM

## 2021-08-04 DIAGNOSIS — Z5111 Encounter for antineoplastic chemotherapy: Secondary | ICD-10-CM

## 2021-08-04 LAB — CMP (CANCER CENTER ONLY)
ALT: 21 U/L (ref 0–44)
AST: 25 U/L (ref 15–41)
Albumin: 3.5 g/dL (ref 3.5–5.0)
Alkaline Phosphatase: 101 U/L (ref 38–126)
Anion gap: 9 (ref 5–15)
BUN: 13 mg/dL (ref 6–20)
CO2: 26 mmol/L (ref 22–32)
Calcium: 9.4 mg/dL (ref 8.9–10.3)
Chloride: 103 mmol/L (ref 98–111)
Creatinine: 0.89 mg/dL (ref 0.61–1.24)
GFR, Estimated: >60 mL/min (ref >60)
Glucose, Bld: 105 mg/dL — ABNORMAL HIGH (ref 70–99)
Potassium: 4.2 mmol/L (ref 3.5–5.1)
Sodium: 138 mmol/L (ref 135–145)
Total Bilirubin: 0.3 mg/dL (ref 0.3–1.2)
Total Protein: 7.1 g/dL (ref 6.5–8.1)

## 2021-08-04 LAB — CBC WITH DIFFERENTIAL (CANCER CENTER ONLY)
Abs Immature Granulocytes: 0.02 10*3/uL (ref 0.00–0.07)
Basophils Absolute: 0 10*3/uL (ref 0.0–0.1)
Basophils Relative: 0 %
Eosinophils Absolute: 0 10*3/uL (ref 0.0–0.5)
Eosinophils Relative: 2 %
HCT: 21.7 % — ABNORMAL LOW (ref 39.0–52.0)
Hemoglobin: 7.4 g/dL — ABNORMAL LOW (ref 13.0–17.0)
Immature Granulocytes: 1 %
Lymphocytes Relative: 18 %
Lymphs Abs: 0.5 10*3/uL — ABNORMAL LOW (ref 0.7–4.0)
MCH: 32.9 pg (ref 26.0–34.0)
MCHC: 34.1 g/dL (ref 30.0–36.0)
MCV: 96.4 fL (ref 80.0–100.0)
Monocytes Absolute: 0.5 10*3/uL (ref 0.1–1.0)
Monocytes Relative: 19 %
Neutro Abs: 1.6 10*3/uL — ABNORMAL LOW (ref 1.7–7.7)
Neutrophils Relative %: 60 %
Platelet Count: 212 10*3/uL (ref 150–400)
RBC: 2.25 MIL/uL — ABNORMAL LOW (ref 4.22–5.81)
RDW: 16.1 % — ABNORMAL HIGH (ref 11.5–15.5)
WBC Count: 2.7 10*3/uL — ABNORMAL LOW (ref 4.0–10.5)
nRBC: 0.8 % — ABNORMAL HIGH (ref 0.0–0.2)

## 2021-08-04 LAB — SAMPLE TO BLOOD BANK

## 2021-08-04 MED ORDER — SODIUM CHLORIDE 0.9 % IV SOLN
548.0000 mg | Freq: Once | INTRAVENOUS | Status: AC
  Administered 2021-08-04: 550 mg via INTRAVENOUS
  Filled 2021-08-04: qty 55

## 2021-08-04 MED ORDER — SODIUM CHLORIDE 0.9 % IV SOLN
10.0000 mg | Freq: Once | INTRAVENOUS | Status: AC
  Administered 2021-08-04: 10 mg via INTRAVENOUS
  Filled 2021-08-04: qty 10

## 2021-08-04 MED ORDER — FAMOTIDINE 20 MG IN NS 100 ML IVPB
20.0000 mg | Freq: Once | INTRAVENOUS | Status: AC
  Administered 2021-08-04: 20 mg via INTRAVENOUS
  Filled 2021-08-04: qty 100

## 2021-08-04 MED ORDER — SODIUM CHLORIDE 0.9 % IV SOLN
100.0000 mg/m2 | Freq: Once | INTRAVENOUS | Status: AC
  Administered 2021-08-04: 180 mg via INTRAVENOUS
  Filled 2021-08-04: qty 9

## 2021-08-04 MED ORDER — HEPARIN SOD (PORK) LOCK FLUSH 100 UNIT/ML IV SOLN
500.0000 [IU] | Freq: Once | INTRAVENOUS | Status: AC | PRN
  Administered 2021-08-04: 500 [IU]

## 2021-08-04 MED ORDER — SODIUM CHLORIDE 0.9 % IV SOLN
150.0000 mg | Freq: Once | INTRAVENOUS | Status: AC
  Administered 2021-08-04: 150 mg via INTRAVENOUS
  Filled 2021-08-04: qty 150

## 2021-08-04 MED ORDER — SODIUM CHLORIDE 0.9 % IV SOLN: Freq: Once | INTRAVENOUS | Status: AC

## 2021-08-04 MED ORDER — PALONOSETRON HCL INJECTION 0.25 MG/5ML
0.2500 mg | Freq: Once | INTRAVENOUS | Status: AC
  Administered 2021-08-04: 0.25 mg via INTRAVENOUS
  Filled 2021-08-04: qty 5

## 2021-08-04 MED ORDER — SODIUM CHLORIDE 0.9% FLUSH
10.0000 mL | Freq: Once | INTRAVENOUS | Status: AC
  Administered 2021-08-04: 10 mL

## 2021-08-04 MED ORDER — SODIUM CHLORIDE 0.9% FLUSH
10.0000 mL | INTRAVENOUS | Status: DC | PRN
  Administered 2021-08-04: 10 mL

## 2021-08-04 MED ORDER — DIPHENHYDRAMINE HCL 25 MG PO CAPS
50.0000 mg | ORAL_CAPSULE | Freq: Once | ORAL | Status: AC
  Administered 2021-08-04: 50 mg via ORAL
  Filled 2021-08-04: qty 2

## 2021-08-04 MED ORDER — TRILACICLIB DIHYDROCHLORIDE INJECTION 300 MG
240.0000 mg/m2 | Freq: Once | INTRAVENOUS | Status: AC
  Administered 2021-08-04: 435 mg via INTRAVENOUS
  Filled 2021-08-04: qty 29

## 2021-08-04 NOTE — Progress Notes (Signed)
Per Dr. Julien Nordmann ,it is ok to treat pt today with Carbo and vp-16 and hgb of 7.4.

## 2021-08-04 NOTE — Patient Instructions (Signed)
[facility]  Discharge Instructions: Thank you for choosing [facility] to provide your oncology and hematology care.   If you have a lab appointment with the [facility], please go directly to the [facility] and check in at the registration area.   Wear comfortable clothing and clothing appropriate for easy access to any Portacath or PICC line.   We strive to give you quality time with your provider. You may need to reschedule your appointment if you arrive late (15 or more minutes).  Arriving late affects you and other patients whose appointments are after yours.  Also, if you miss three or more appointments without notifying the office, you may be dismissed from the clinic at the provider's discretion.      For prescription refill requests, have your pharmacy contact our office and allow 72 hours for refills to be completed.    Today you received the following chemotherapy and/or immunotherapy agents carboplatin, etoposide      To help prevent nausea and vomiting after your treatment, we encourage you to take your nausea medication as directed.  BELOW ARE SYMPTOMS THAT SHOULD BE REPORTED IMMEDIATELY: *FEVER GREATER THAN 100.4 F (38 C) OR HIGHER *CHILLS OR SWEATING *NAUSEA AND VOMITING THAT IS NOT CONTROLLED WITH YOUR NAUSEA MEDICATION *UNUSUAL SHORTNESS OF BREATH *UNUSUAL BRUISING OR BLEEDING *URINARY PROBLEMS (pain or burning when urinating, or frequent urination) *BOWEL PROBLEMS (unusual diarrhea, constipation, pain near the anus) TENDERNESS IN MOUTH AND THROAT WITH OR WITHOUT PRESENCE OF ULCERS (sore throat, sores in mouth, or a toothache) UNUSUAL RASH, SWELLING OR PAIN  UNUSUAL VAGINAL DISCHARGE OR ITCHING   Items with * indicate a potential emergency and should be followed up as soon as possible or go to the Emergency Department if any problems should occur.  Please show the CHEMOTHERAPY ALERT CARD or IMMUNOTHERAPY ALERT CARD at  check-in to the Emergency Department and triage nurse.  Should you have questions after your visit or need to cancel or reschedule your appointment, please contact [facility]  Dept: [phone]  and follow the prompts.  Office hours are 8:00 a.m. to 4:30 p.m. Monday - Friday. Please note that voicemails left after 4:00 p.m. may not be returned until the following business day.  We are closed weekends and major holidays. You have access to a nurse at all times for urgent questions. Please call the main number to the clinic Dept: [phone] and follow the prompts.   For any non-urgent questions, you may also contact your provider using MyChart. We now offer e-Visits for anyone 45 and older to request care online for non-urgent symptoms. For details visit [email or web address].   Also download the MyChart app! Go to the app store, search "MyChart", open the app, select [facility], and log in with your MyChart username and password.  Due to Covid, a mask is required upon entering the hospital/clinic. If you do not have a mask, one will be given to you upon arrival. For doctor visits, patients may have 1 support person aged 9 or older with them. For treatment visits, patients cannot have anyone with them due to current Covid guidelines and our immunocompromised population.

## 2021-08-04 NOTE — Addendum Note (Signed)
Addended by: Ardeen Garland on: 08/04/2021 10:18 AM   Modules accepted: Orders

## 2021-08-04 NOTE — Progress Notes (Signed)
[facility] [phone]   [phone]  OFFICE PROGRESS NOTE  Richard Borg, MD [place] [id number]  DIAGNOSIS: Extensive stage (T2b, N2, M1c) small cell lung cancer presented with left upper lobe pulmonary nodule in addition to left hilar mass with mediastinal invasion and solitary brain metastasis diagnosed in May 2021  PRIOR THERAPY:  1) Palliative radiation to the left hilar and mediastinal lymphadenopathy under the care of Dr. Lisbeth Renshaw. 2) Cranial irradiation from 08/26/20-09/06/20 under the care of Dr. Lisbeth Renshaw 3) Palliative systemic chemotherapy with carboplatin for AUC of 5 on day 1, etoposide 100 mg/M2 on days 1, 2 and 3 as well as Imfinzi 1500 mg IV every 3 weeks with the chemotherapy.  First dose of chemotherapy May 06, 2020.  The patient will receive treatment with Cosela on the days of his chemotherapy.  Status post 14 cycles.  Starting from cycle #5 he started maintenance immunotherapy with Imfinzi 1500 mg IV every 4 weeks. Discontinued due to disease progression. Last dose 05/29/21.    CURRENT THERAPY: Carboplatin for an AUC of 5 and etoposide 100 mg/M2 on days 1, 2, and 3 IV every 3 weeks Neulasta or Cosela.  First dose expected on 06/24/2021.  Status post 2 cycles.  INTERVAL HISTORY: Richard Santos 60 y.o. male returns to the clinic today for follow-up visit accompanied by his wife.  The patient continues to complain of increasing fatigue and weakness.  He denied having any current chest pain, shortness of breath, cough or hemoptysis.  He has no nausea, vomiting, diarrhea or constipation.  He denied having any headache or visual changes.  He has no weight loss or night sweats.  He continues to tolerate his systemic chemotherapy fairly well except for the fatigue.  The patient had repeat CT scan of the chest, abdomen pelvis performed recently and he is here for evaluation and discussion of his discuss results.  MEDICAL  HISTORY: Past Medical History:  Diagnosis Date   Allergic rhinitis    Allergy    Anxiety    Chronic low back pain    DDD (degenerative disc disease), lumbar    ED (erectile dysfunction)    Finger injury    cut pads off 3rd and 4th finger left hand/due to lawn mower accident   GERD (gastroesophageal reflux disease)    Heart murmur    as child only-    History of kidney stones    HTN (hypertension) 08/20/2016   Hyperlipidemia    Incomplete right bundle branch block    Prostate cancer ([place] [facility]) dx 10/02/14   stage T1c   Sigmoid diverticulosis    Small cell lung cancer ([facility]) 03/2020   Wears glasses     ALLERGIES:  is allergic to bee venom, elemental sulfur, other, sulfa antibiotics, and namenda [memantine].  MEDICATIONS:  Current Outpatient Medications  Medication Sig Dispense Refill   acetaminophen (TYLENOL) 325 MG tablet Take 2 tablets (650 mg total) by mouth every 6 (six) hours as needed for mild pain (or Fever >/= 101). 30 tablet 0   aspirin EC 81 MG tablet Take 81 mg by mouth daily. Swallow whole.     BREZTRI AEROSPHERE 160-9-4.8 MCG/ACT AERO INHALE 2 PUFFS INTO THE LUNGS IN THE MORNING AND AT BEDTIME. 10.7 g 10   busPIRone (BUSPAR) 10 MG tablet Take 10 mg by mouth 2 (two) times daily.     famotidine (PEPCID) 20 MG tablet Take 1 tablet (20 mg total)  by mouth 2 (two) times daily. 60 tablet 2   Multiple Vitamin (MULTIVITAMIN) capsule Take 1 capsule by mouth daily.     ondansetron (ZOFRAN-ODT) 8 MG disintegrating tablet Take 1 tablet (8 mg total) by mouth every 8 (eight) hours as needed for nausea or vomiting. 30 tablet 2   pantoprazole (PROTONIX) 40 MG tablet Take 40 mg by mouth every morning.     prochlorperazine (COMPAZINE) 10 MG tablet TAKE 1 TABLET BY MOUTH EVERY 6 HOURS AS NEEDED 30 tablet 2   sodium bicarbonate 650 MG tablet Take 1 tablet (650 mg total) by mouth 2 (two) times daily. 60 tablet 1   tolvaptan (SAMSCA) 15 MG TABS tablet Take 15 mg by mouth daily. Per pts email -he  does not have it yet from [facility]     traMADol (ULTRAM) 50 MG tablet TAKE 1/2 TABLET BY MOUTH TWICE A DAY AS NEEDED FOR PAIN 60 tablet 2   No current facility-administered medications for this visit.   Facility-Administered Medications Ordered in Other Visits  Medication Dose Route Frequency Provider Last Rate Last Admin   heparin lock flush 100 unit/mL  500 Units Intracatheter Daily PRN Curt Bears, MD       sodium chloride flush (NS) 0.9 % injection 10 mL  10 mL Intracatheter PRN Curt Bears, MD       sodium chloride flush (NS) 0.9 % injection 10 mL  10 mL Intracatheter PRN Curt Bears, MD   10 mL at 07/16/21 1606    SURGICAL HISTORY:  Past Surgical History:  Procedure Laterality Date   BRONCHIAL BIOPSY  04/12/2020   Procedure: BRONCHIAL BIOPSIES;  Surgeon: Marshell Garfinkel, MD;  Location: [facility];  Service: Cardiopulmonary;;   BRONCHIAL BRUSHINGS  04/12/2020   Procedure: BRONCHIAL BRUSHINGS;  Surgeon: Marshell Garfinkel, MD;  Location: [facility];  Service: Cardiopulmonary;;   BRONCHIAL NEEDLE ASPIRATION BIOPSY  04/12/2020   Procedure: BRONCHIAL NEEDLE ASPIRATION BIOPSIES;  Surgeon: Marshell Garfinkel, MD;  Location: [facility];  Service: Cardiopulmonary;;   BRONCHIAL WASHINGS  04/12/2020   Procedure: BRONCHIAL WASHINGS;  Surgeon: Marshell Garfinkel, MD;  Location: [facility];  Service: Cardiopulmonary;;   COLONOSCOPY     COLONOSCOPY W/ POLYPECTOMY  04-19-2012   ENDOBRONCHIAL ULTRASOUND N/A 04/12/2020   Procedure: ENDOBRONCHIAL ULTRASOUND;  Surgeon: Marshell Garfinkel, MD;  Location: [facility];  Service: Cardiopulmonary;  Laterality: N/A;   ESOPHAGOGASTRODUODENOSCOPY  08-05-2010   HEMOSTASIS CONTROL  04/12/2020   Procedure: HEMOSTASIS CONTROL;  Surgeon: Marshell Garfinkel, MD;  Location: [facility];  Service: Cardiopulmonary;;   IR IMAGING GUIDED PORT INSERTION  05/31/2020   POLYPECTOMY     PROSTATE BIOPSY  10/02/14   RADIOACTIVE SEED IMPLANT N/A 01/30/2015    Procedure: RADIOACTIVE SEED IMPLANT;  Surgeon: Rana Snare, MD;  Location: [facility];  Service: Urology;  Laterality: N/A;   UPPER GASTROINTESTINAL ENDOSCOPY  04/03/2021   VIDEO BRONCHOSCOPY N/A 04/12/2020   Procedure: VIDEO BRONCHOSCOPY WITHOUT FLUORO;  Surgeon: Marshell Garfinkel, MD;  Location: [facility];  Service: Cardiopulmonary;  Laterality: N/A;    REVIEW OF SYSTEMS:  Constitutional: positive for fatigue Eyes: negative Ears, nose, mouth, throat, and face: negative Respiratory: negative Cardiovascular: negative Gastrointestinal: negative Genitourinary:negative Integument/breast: negative Hematologic/lymphatic: negative Musculoskeletal:negative Neurological: negative Behavioral/Psych: negative Endocrine: negative Allergic/Immunologic: negative   PHYSICAL EXAMINATION: General appearance: alert, cooperative, fatigued, and no distress Head: Normocephalic, without obvious abnormality, atraumatic Neck: no adenopathy, no JVD, supple, symmetrical, trachea midline, and thyroid not enlarged, symmetric, no tenderness/mass/nodules Lymph nodes: Cervical, supraclavicular, and axillary nodes normal.  Resp: clear to auscultation bilaterally Back: symmetric, no curvature. ROM normal. No CVA tenderness. Cardio: regular rate and rhythm, S1, S2 normal, no murmur, click, rub or gallop GI: soft, non-tender; bowel sounds normal; no masses,  no organomegaly Extremities: extremities normal, atraumatic, no cyanosis or edema Neurologic: Alert and oriented X 3, normal strength and tone. Normal symmetric reflexes. Normal coordination and gait  ECOG PERFORMANCE STATUS: 1 - Symptomatic but completely ambulatory  Blood pressure 103/66, pulse 96, temperature (!) 95.8 F (35.4 C), temperature source Tympanic, resp. rate 20, height 6' (1.829 m), weight 143 lb 4.8 oz (65 kg), SpO2 100 %.  LABORATORY DATA: Lab Results  Component Value Date   WBC 2.7 (L) 08/04/2021   HGB 7.4 (L) 08/04/2021    HCT 21.7 (L) 08/04/2021   MCV 96.4 08/04/2021   PLT 212 08/04/2021      Chemistry      Component Value Date/Time   NA 140 07/29/2021 1146   K 4.3 07/29/2021 1146   CL 103 07/29/2021 1146   CO2 26 07/29/2021 1146   BUN 12 07/29/2021 1146   CREATININE 0.83 07/29/2021 1146      Component Value Date/Time   CALCIUM 9.5 07/29/2021 1146   ALKPHOS 104 07/29/2021 1146   AST 30 07/29/2021 1146   ALT 22 07/29/2021 1146   BILITOT 0.3 07/29/2021 1146       RADIOGRAPHIC STUDIES: CT Chest W Contrast  Result Date: 08/04/2021 CLINICAL DATA:  Restaging small cell lung cancer, ongoing chemotherapy. Prior radiation therapy and immunotherapy. EXAM: CT CHEST, ABDOMEN, AND PELVIS WITH CONTRAST TECHNIQUE: Multidetector CT imaging of the chest, abdomen and pelvis was performed following the standard protocol during bolus administration of intravenous contrast. CONTRAST:  32mL OMNIPAQUE IOHEXOL 350 MG/ML SOLN COMPARISON:  Multiple exams, including 05/22/2021 FINDINGS: CT CHEST FINDINGS Cardiovascular: Right Port-A-Cath tip: Lower SVC. Coronary, aortic arch, and branch vessel atherosclerotic vascular disease. Mediastinum/Nodes: Unremarkable Lungs/Pleura: Biapical pleuroparenchymal scarring with associated calcifications. Stable faint reticulonodular opacities peripherally in the right upper lobe. Paraseptal emphysema. Increased cavitation of the left apical nodule, currently with thin marginal walls and some internal septation and mild nodularity, measuring about 2.1 by 1.4 cm on image 42 series 4, previously thick-walled and by my measurement about 2.2 by 1.4 cm on prior exam. Substantial volume loss and paramediastinal bandlike density favoring fibrosis with associated traction bronchiectasis. Some prior left lower lobe nodularity along the posterior cardiophrenic sulcus has resolved. The previously cavitary lingular lesion seems to have collapsed and is now simply part of the paramediastinal  fibrosis/consolidation. Some of the previous nodularity in the left upper lobe is indistinct or incorporated into the paramediastinal fibrosis. This includes a nodular component along the top of the fibrosis measuring 1.1 by 0.7 cm, previously about 1.0 by 0.7 cm by my measurements. Musculoskeletal: Thoracic spondylosis. Stable 0.6 cm sclerotic lesion extent eccentric to the left in the T8 vertebral body. Faint sclerosis in the upper sternal body is not appreciably changed. 3 mm subcutaneous nodule or lymph node along the right anterior chest wall subcutaneous tissues on image 34 series 2. Subareolar density in the left chest measuring 0.6 cm anterior-posterior previously measured 1.1 cm anterior-posterior, and may be from gynecomastia but is technically nonspecific. New small sclerotic lesions in the left proximal humerus on image 8 series 2, largest 0.7 cm in diameter. CT ABDOMEN PELVIS FINDINGS Hepatobiliary: Unremarkable Pancreas: Unremarkable Spleen: Unremarkable Adrenals/Urinary Tract: Simple appearing right kidney lower pole cyst. Nondistended urinary bladder. 5 mm left mid upper kidney nonobstructive  renal calculus. The left adrenal mass measures 1.6 by 0.8 cm, formerly 4.3 by 2.6 cm. Stomach/Bowel: Unremarkable Vascular/Lymphatic: Atherosclerosis is present, including aortoiliac atherosclerotic disease. Reproductive: Brachytherapy seed implants are distributed in the prostate gland. Other: No supplemental non-categorized findings. Musculoskeletal: New 1.1 cm sclerotic lesion of the right proximal femur on image 130 series 2. New 1.5 cm in diameter left femoral head sclerotic lesion on image 71 series 5. Degenerative disc disease and degenerative endplate sclerosis at Z5-G3 with evidence of right foraminal impingement at this level due to spurring. IMPRESSION: 1. Increasing cavitation of the left apical nodule, which previously had thick walls and only a small amount of internal gas, now mostly gas-filled  with thin walls and some minimal internal nodularity. The prior cavitary lesion in the lingula seems to have collapsed and is incorporated into the paramediastinal fibrosis region. 2. Marked improvement in the left adrenal mass, currently 1.6 by 0.8 cm and formerly 4.3 by 2.6 cm. 3. Some of the nodularity in the left upper lobe and left lower lobe is reduced or resolved. 4. However, in addition to the tiny sclerotic lesion in T8 and the mild sclerosis in the upper sternal body, there several new sclerotic lesions including in the left femoral head, the right proximal femoral metaphysis, and the left humeral head. Although a somewhat unusual distribution, the appearance must be concerning for the possibility of sclerotic metastatic lesions. Given the improvement in other areas and in particular the adrenal mass, it is possible that these represent lesions which were previously occult and have been effectively treated, leading to sclerosis. 5. Other imaging findings of potential clinical significance: Aortic Atherosclerosis (ICD10-I70.0) and Emphysema (ICD10-J43.9). Coronary atherosclerosis. Mild reticulonodular interstitial accentuation in the right lung. Nonobstructive left nephrolithiasis. Brachytherapy seeds in the prostate gland. Right foraminal impingement at L5-S1 from spurring. Electronically Signed   By: Van Clines M.D.   On: 08/04/2021 07:58   CT Abdomen Pelvis W Contrast  Result Date: 08/04/2021 CLINICAL DATA:  Restaging small cell lung cancer, ongoing chemotherapy. Prior radiation therapy and immunotherapy. EXAM: CT CHEST, ABDOMEN, AND PELVIS WITH CONTRAST TECHNIQUE: Multidetector CT imaging of the chest, abdomen and pelvis was performed following the standard protocol during bolus administration of intravenous contrast. CONTRAST:  19mL OMNIPAQUE IOHEXOL 350 MG/ML SOLN COMPARISON:  Multiple exams, including 05/22/2021 FINDINGS: CT CHEST FINDINGS Cardiovascular: Right Port-A-Cath tip: Lower SVC.  Coronary, aortic arch, and branch vessel atherosclerotic vascular disease. Mediastinum/Nodes: Unremarkable Lungs/Pleura: Biapical pleuroparenchymal scarring with associated calcifications. Stable faint reticulonodular opacities peripherally in the right upper lobe. Paraseptal emphysema. Increased cavitation of the left apical nodule, currently with thin marginal walls and some internal septation and mild nodularity, measuring about 2.1 by 1.4 cm on image 42 series 4, previously thick-walled and by my measurement about 2.2 by 1.4 cm on prior exam. Substantial volume loss and paramediastinal bandlike density favoring fibrosis with associated traction bronchiectasis. Some prior left lower lobe nodularity along the posterior cardiophrenic sulcus has resolved. The previously cavitary lingular lesion seems to have collapsed and is now simply part of the paramediastinal fibrosis/consolidation. Some of the previous nodularity in the left upper lobe is indistinct or incorporated into the paramediastinal fibrosis. This includes a nodular component along the top of the fibrosis measuring 1.1 by 0.7 cm, previously about 1.0 by 0.7 cm by my measurements. Musculoskeletal: Thoracic spondylosis. Stable 0.6 cm sclerotic lesion extent eccentric to the left in the T8 vertebral body. Faint sclerosis in the upper sternal body is not appreciably changed. 3 mm subcutaneous  nodule or lymph node along the right anterior chest wall subcutaneous tissues on image 34 series 2. Subareolar density in the left chest measuring 0.6 cm anterior-posterior previously measured 1.1 cm anterior-posterior, and may be from gynecomastia but is technically nonspecific. New small sclerotic lesions in the left proximal humerus on image 8 series 2, largest 0.7 cm in diameter. CT ABDOMEN PELVIS FINDINGS Hepatobiliary: Unremarkable Pancreas: Unremarkable Spleen: Unremarkable Adrenals/Urinary Tract: Simple appearing right kidney lower pole cyst. Nondistended  urinary bladder. 5 mm left mid upper kidney nonobstructive renal calculus. The left adrenal mass measures 1.6 by 0.8 cm, formerly 4.3 by 2.6 cm. Stomach/Bowel: Unremarkable Vascular/Lymphatic: Atherosclerosis is present, including aortoiliac atherosclerotic disease. Reproductive: Brachytherapy seed implants are distributed in the prostate gland. Other: No supplemental non-categorized findings. Musculoskeletal: New 1.1 cm sclerotic lesion of the right proximal femur on image 130 series 2. New 1.5 cm in diameter left femoral head sclerotic lesion on image 71 series 5. Degenerative disc disease and degenerative endplate sclerosis at N5-A2 with evidence of right foraminal impingement at this level due to spurring. IMPRESSION: 1. Increasing cavitation of the left apical nodule, which previously had thick walls and only a small amount of internal gas, now mostly gas-filled with thin walls and some minimal internal nodularity. The prior cavitary lesion in the lingula seems to have collapsed and is incorporated into the paramediastinal fibrosis region. 2. Marked improvement in the left adrenal mass, currently 1.6 by 0.8 cm and formerly 4.3 by 2.6 cm. 3. Some of the nodularity in the left upper lobe and left lower lobe is reduced or resolved. 4. However, in addition to the tiny sclerotic lesion in T8 and the mild sclerosis in the upper sternal body, there several new sclerotic lesions including in the left femoral head, the right proximal femoral metaphysis, and the left humeral head. Although a somewhat unusual distribution, the appearance must be concerning for the possibility of sclerotic metastatic lesions. Given the improvement in other areas and in particular the adrenal mass, it is possible that these represent lesions which were previously occult and have been effectively treated, leading to sclerosis. 5. Other imaging findings of potential clinical significance: Aortic Atherosclerosis (ICD10-I70.0) and Emphysema  (ICD10-J43.9). Coronary atherosclerosis. Mild reticulonodular interstitial accentuation in the right lung. Nonobstructive left nephrolithiasis. Brachytherapy seeds in the prostate gland. Right foraminal impingement at L5-S1 from spurring. Electronically Signed   By: Van Clines M.D.   On: 08/04/2021 07:58     ASSESSMENT AND PLAN: This is a very pleasant 60 years old white male recently diagnosed with extensive stage small cell lung cancer presented with left upper lobe lung mass in addition to left hilar and mediastinal lymphadenopathy as well as solitary brain metastasis diagnosed in May 2021. He underwent palliative radiotherapy to the left hilar and mediastinal lymphadenopathy in addition to the radiotherapy to the brain metastasis.  He continues to have residual odynophagia and dysphagia from the palliative radiotherapy. He is currently undergoing systemic chemotherapy with carboplatin for AUC of 5 on day 1, etoposide 100 mg/M2 on days 1, 2 and 3 with Cosela infusion before chemotherapy.  He is status post 14 cycles of treatment  Starting from cycle #5 the patient is on treatment with maintenance Imfinzi every 4 weeks. 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  His treatment was discontinued. He started treatment with systemic chemotherapy again with carboplatin for AUC of 5 on day 1 and 2 etoposide 100 Mg/M2 on  days, 1, 2 and 3 every 3 weeks with Cosela status post 2 cycles. The patient continues to tolerate this treatment well with no concerning adverse effect except for fatigue secondary to chemotherapy-induced anemia. He had repeat CT scan of the chest, abdomen pelvis that showed improvement of his disease especially in the adrenal glands with increase in the cavitary lesion in the left lung apex. I recommended for the patient to continue his current treatment with chemotherapy and he will proceed with  cycle #3 today. For the chemotherapy-induced anemia, we will arrange for the patient to receive 2 units of PRBCs transfusion this week. For the hyponatremia from SIADH, this is currently improved and he has normal serum sodium the last several weeks.  We will continue to monitor closely. For the history of dysphagia, he will see Dr. Carlean Purl for evaluation and consideration of esophageal dilatation. I will see the patient back for follow-up visit in 3 weeks for evaluation before starting cycle #4. The patient was advised to call immediately if he has any concerning symptoms in the interval. The patient voices understanding of current disease status and treatment options and is in agreement with the current care plan.  All questions were answered. The patient knows to call the clinic with any problems, questions or concerns. We can certainly see the patient much sooner if necessary.   Disclaimer: This note was dictated with voice recognition software. Similar sounding words can inadvertently be transcribed and may not be corrected upon review.

## 2021-08-05 ENCOUNTER — Inpatient Hospital Stay: Payer: 59

## 2021-08-05 ENCOUNTER — Other Ambulatory Visit: Payer: Self-pay | Admitting: Physician Assistant

## 2021-08-05 VITALS — BP 117/73 | HR 93 | Temp 98.0°F | Resp 18

## 2021-08-05 DIAGNOSIS — Z5111 Encounter for antineoplastic chemotherapy: Secondary | ICD-10-CM | POA: Diagnosis not present

## 2021-08-05 DIAGNOSIS — C3412 Malignant neoplasm of upper lobe, left bronchus or lung: Secondary | ICD-10-CM

## 2021-08-05 DIAGNOSIS — D649 Anemia, unspecified: Secondary | ICD-10-CM

## 2021-08-05 MED ORDER — TRILACICLIB DIHYDROCHLORIDE INJECTION 300 MG
240.0000 mg/m2 | Freq: Once | INTRAVENOUS | Status: AC
  Administered 2021-08-05: 435 mg via INTRAVENOUS
  Filled 2021-08-05: qty 29

## 2021-08-05 MED ORDER — SODIUM CHLORIDE 0.9 % IV SOLN: Freq: Once | INTRAVENOUS | Status: AC

## 2021-08-05 MED ORDER — SODIUM CHLORIDE 0.9 % IV SOLN
100.0000 mg/m2 | Freq: Once | INTRAVENOUS | Status: AC
  Administered 2021-08-05: 180 mg via INTRAVENOUS
  Filled 2021-08-05: qty 9

## 2021-08-05 MED ORDER — HEPARIN SOD (PORK) LOCK FLUSH 100 UNIT/ML IV SOLN
500.0000 [IU] | Freq: Once | INTRAVENOUS | Status: AC | PRN
  Administered 2021-08-05: 500 [IU]

## 2021-08-05 MED ORDER — SODIUM CHLORIDE 0.9 % IV SOLN
10.0000 mg | Freq: Once | INTRAVENOUS | Status: AC
  Administered 2021-08-05: 10 mg via INTRAVENOUS
  Filled 2021-08-05: qty 10

## 2021-08-05 MED ORDER — SODIUM CHLORIDE 0.9% FLUSH
10.0000 mL | INTRAVENOUS | Status: DC | PRN
  Administered 2021-08-05: 10 mL

## 2021-08-05 MED FILL — Dexamethasone Sodium Phosphate Inj 100 MG/10ML: INTRAMUSCULAR | Qty: 1 | Status: AC

## 2021-08-05 NOTE — Patient Instructions (Signed)
[facility]   Discharge Instructions: Thank you for choosing [facility] to provide your oncology and hematology care.   If you have a lab appointment with the [facility], please go directly to the [facility] and check in at the registration area.   Wear comfortable clothing and clothing appropriate for easy access to any Portacath or PICC line.   We strive to give you quality time with your provider. You may need to reschedule your appointment if you arrive late (15 or more minutes).  Arriving late affects you and other patients whose appointments are after yours.  Also, if you miss three or more appointments without notifying the office, you may be dismissed from the clinic at the provider's discretion.      For prescription refill requests, have your pharmacy contact our office and allow 72 hours for refills to be completed.    Today you received the following chemotherapy and/or immunotherapy agents: etoposide and cosela.      To help prevent nausea and vomiting after your treatment, we encourage you to take your nausea medication as directed.  BELOW ARE SYMPTOMS THAT SHOULD BE REPORTED IMMEDIATELY: *FEVER GREATER THAN 100.4 F (38 C) OR HIGHER *CHILLS OR SWEATING *NAUSEA AND VOMITING THAT IS NOT CONTROLLED WITH YOUR NAUSEA MEDICATION *UNUSUAL SHORTNESS OF BREATH *UNUSUAL BRUISING OR BLEEDING *URINARY PROBLEMS (pain or burning when urinating, or frequent urination) *BOWEL PROBLEMS (unusual diarrhea, constipation, pain near the anus) TENDERNESS IN MOUTH AND THROAT WITH OR WITHOUT PRESENCE OF ULCERS (sore throat, sores in mouth, or a toothache) UNUSUAL RASH, SWELLING OR PAIN  UNUSUAL VAGINAL DISCHARGE OR ITCHING   Items with * indicate a potential emergency and should be followed up as soon as possible or go to the Emergency Department if any problems should occur.  Please show the CHEMOTHERAPY ALERT CARD or IMMUNOTHERAPY ALERT CARD  at check-in to the Emergency Department and triage nurse.  Should you have questions after your visit or need to cancel or reschedule your appointment, please contact [facility]  Dept: [phone]  and follow the prompts.  Office hours are 8:00 a.m. to 4:30 p.m. Monday - Friday. Please note that voicemails left after 4:00 p.m. may not be returned until the following business day.  We are closed weekends and major holidays. You have access to a nurse at all times for urgent questions. Please call the main number to the clinic Dept: [phone] and follow the prompts.   For any non-urgent questions, you may also contact your provider using MyChart. We now offer e-Visits for anyone 29 and older to request care online for non-urgent symptoms. For details visit [email or web address].   Also download the MyChart app! Go to the app store, search "MyChart", open the app, select [facility], and log in with your MyChart username and password.  Due to Covid, a mask is required upon entering the hospital/clinic. If you do not have a mask, one will be given to you upon arrival. For doctor visits, patients may have 1 support person aged 42 or older with them. For treatment visits, patients cannot have anyone with them due to current Covid guidelines and our immunocompromised population.

## 2021-08-06 ENCOUNTER — Other Ambulatory Visit: Payer: Self-pay

## 2021-08-06 ENCOUNTER — Inpatient Hospital Stay: Payer: 59

## 2021-08-06 VITALS — BP 113/76 | HR 93 | Temp 98.5°F | Resp 18

## 2021-08-06 DIAGNOSIS — Z5111 Encounter for antineoplastic chemotherapy: Secondary | ICD-10-CM | POA: Diagnosis not present

## 2021-08-06 DIAGNOSIS — C3412 Malignant neoplasm of upper lobe, left bronchus or lung: Secondary | ICD-10-CM

## 2021-08-06 LAB — PREPARE RBC (CROSSMATCH)

## 2021-08-06 LAB — SAMPLE TO BLOOD BANK

## 2021-08-06 MED ORDER — TRILACICLIB DIHYDROCHLORIDE INJECTION 300 MG
240.0000 mg/m2 | Freq: Once | INTRAVENOUS | Status: AC
  Administered 2021-08-06: 435 mg via INTRAVENOUS
  Filled 2021-08-06: qty 29

## 2021-08-06 MED ORDER — HEPARIN SOD (PORK) LOCK FLUSH 100 UNIT/ML IV SOLN
250.0000 [IU] | Freq: Once | INTRAVENOUS | Status: AC | PRN
  Administered 2021-08-06: 250 [IU]

## 2021-08-06 MED ORDER — SODIUM CHLORIDE 0.9 % IV SOLN
10.0000 mg | Freq: Once | INTRAVENOUS | Status: AC
  Administered 2021-08-06: 10 mg via INTRAVENOUS
  Filled 2021-08-06: qty 10

## 2021-08-06 MED ORDER — SODIUM CHLORIDE 0.9 % IV SOLN
100.0000 mg/m2 | Freq: Once | INTRAVENOUS | Status: AC
  Administered 2021-08-06: 180 mg via INTRAVENOUS
  Filled 2021-08-06: qty 9

## 2021-08-06 MED ORDER — SODIUM CHLORIDE 0.9% FLUSH
3.0000 mL | INTRAVENOUS | Status: DC | PRN
  Administered 2021-08-06: 3 mL

## 2021-08-06 MED ORDER — SODIUM CHLORIDE 0.9 % IV SOLN: Freq: Once | INTRAVENOUS | Status: AC

## 2021-08-06 NOTE — Patient Instructions (Signed)
[facility]   Discharge Instructions: Thank you for choosing [facility] to provide your oncology and hematology care.   If you have a lab appointment with the [facility], please go directly to the [facility] and check in at the registration area.   Wear comfortable clothing and clothing appropriate for easy access to any Portacath or PICC line.   We strive to give you quality time with your provider. You may need to reschedule your appointment if you arrive late (15 or more minutes).  Arriving late affects you and other patients whose appointments are after yours.  Also, if you miss three or more appointments without notifying the office, you may be dismissed from the clinic at the provider's discretion.      For prescription refill requests, have your pharmacy contact our office and allow 72 hours for refills to be completed.    Today you received the following chemotherapy and/or immunotherapy agents:Etopside and Cosela      To help prevent nausea and vomiting after your treatment, we encourage you to take your nausea medication as directed.  BELOW ARE SYMPTOMS THAT SHOULD BE REPORTED IMMEDIATELY: *FEVER GREATER THAN 100.4 F (38 C) OR HIGHER *CHILLS OR SWEATING *NAUSEA AND VOMITING THAT IS NOT CONTROLLED WITH YOUR NAUSEA MEDICATION *UNUSUAL SHORTNESS OF BREATH *UNUSUAL BRUISING OR BLEEDING *URINARY PROBLEMS (pain or burning when urinating, or frequent urination) *BOWEL PROBLEMS (unusual diarrhea, constipation, pain near the anus) TENDERNESS IN MOUTH AND THROAT WITH OR WITHOUT PRESENCE OF ULCERS (sore throat, sores in mouth, or a toothache) UNUSUAL RASH, SWELLING OR PAIN  UNUSUAL VAGINAL DISCHARGE OR ITCHING   Items with * indicate a potential emergency and should be followed up as soon as possible or go to the Emergency Department if any problems should occur.  Please show the CHEMOTHERAPY ALERT CARD or IMMUNOTHERAPY ALERT CARD at  check-in to the Emergency Department and triage nurse.  Should you have questions after your visit or need to cancel or reschedule your appointment, please contact [facility]  Dept: [phone]  and follow the prompts.  Office hours are 8:00 a.m. to 4:30 p.m. Monday - Friday. Please note that voicemails left after 4:00 p.m. may not be returned until the following business day.  We are closed weekends and major holidays. You have access to a nurse at all times for urgent questions. Please call the main number to the clinic Dept: [phone] and follow the prompts.   For any non-urgent questions, you may also contact your provider using MyChart. We now offer e-Visits for anyone 25 and older to request care online for non-urgent symptoms. For details visit [email or web address].   Also download the MyChart app! Go to the app store, search "MyChart", open the app, select [facility], and log in with your MyChart username and password.  Due to Covid, a mask is required upon entering the hospital/clinic. If you do not have a mask, one will be given to you upon arrival. For doctor visits, patients may have 1 support person aged 2 or older with them. For treatment visits, patients cannot have anyone with them due to current Covid guidelines and our immunocompromised population.

## 2021-08-07 ENCOUNTER — Inpatient Hospital Stay: Payer: 59

## 2021-08-07 DIAGNOSIS — C3412 Malignant neoplasm of upper lobe, left bronchus or lung: Secondary | ICD-10-CM

## 2021-08-07 DIAGNOSIS — D649 Anemia, unspecified: Secondary | ICD-10-CM

## 2021-08-07 DIAGNOSIS — Z5111 Encounter for antineoplastic chemotherapy: Secondary | ICD-10-CM | POA: Diagnosis not present

## 2021-08-07 MED ORDER — SODIUM CHLORIDE 0.9% IV SOLUTION
250.0000 mL | Freq: Once | INTRAVENOUS | Status: AC
  Administered 2021-08-07: 250 mL via INTRAVENOUS

## 2021-08-07 MED ORDER — HEPARIN SOD (PORK) LOCK FLUSH 100 UNIT/ML IV SOLN
500.0000 [IU] | Freq: Every day | INTRAVENOUS | Status: AC | PRN
  Administered 2021-08-07: 500 [IU]

## 2021-08-07 MED ORDER — ACETAMINOPHEN 325 MG PO TABS
650.0000 mg | ORAL_TABLET | Freq: Once | ORAL | Status: AC
  Administered 2021-08-07: 650 mg via ORAL
  Filled 2021-08-07: qty 2

## 2021-08-07 MED ORDER — DIPHENHYDRAMINE HCL 25 MG PO CAPS
25.0000 mg | ORAL_CAPSULE | Freq: Once | ORAL | Status: AC
  Administered 2021-08-07: 25 mg via ORAL
  Filled 2021-08-07: qty 1

## 2021-08-07 NOTE — Patient Instructions (Signed)
[facility]  Discharge Instructions: Thank you for choosing [facility] to provide your oncology and hematology care.   If you have a lab appointment with the [facility], please go directly to the [facility] and check in at the registration area.   Wear comfortable clothing and clothing appropriate for easy access to any Portacath or PICC line.   We strive to give you quality time with your provider. You may need to reschedule your appointment if you arrive late (15 or more minutes).  Arriving late affects you and other patients whose appointments are after yours.  Also, if you miss three or more appointments without notifying the office, you may be dismissed from the clinic at the provider's discretion.      For prescription refill requests, have your pharmacy contact our office and allow 72 hours for refills to be completed.    Today you received the following chemotherapy and/or immunotherapy agents blood       To help prevent nausea and vomiting after your treatment, we encourage you to take your nausea medication as directed.  BELOW ARE SYMPTOMS THAT SHOULD BE REPORTED IMMEDIATELY: *FEVER GREATER THAN 100.4 F (38 C) OR HIGHER *CHILLS OR SWEATING *NAUSEA AND VOMITING THAT IS NOT CONTROLLED WITH YOUR NAUSEA MEDICATION *UNUSUAL SHORTNESS OF BREATH *UNUSUAL BRUISING OR BLEEDING *URINARY PROBLEMS (pain or burning when urinating, or frequent urination) *BOWEL PROBLEMS (unusual diarrhea, constipation, pain near the anus) TENDERNESS IN MOUTH AND THROAT WITH OR WITHOUT PRESENCE OF ULCERS (sore throat, sores in mouth, or a toothache) UNUSUAL RASH, SWELLING OR PAIN  UNUSUAL VAGINAL DISCHARGE OR ITCHING   Items with * indicate a potential emergency and should be followed up as soon as possible or go to the Emergency Department if any problems should occur.  Please show the CHEMOTHERAPY ALERT CARD or IMMUNOTHERAPY ALERT CARD at check-in to the  Emergency Department and triage nurse.  Should you have questions after your visit or need to cancel or reschedule your appointment, please contact [facility]  Dept: [phone]  and follow the prompts.  Office hours are 8:00 a.m. to 4:30 p.m. Monday - Friday. Please note that voicemails left after 4:00 p.m. may not be returned until the following business day.  We are closed weekends and major holidays. You have access to a nurse at all times for urgent questions. Please call the main number to the clinic Dept: [phone] and follow the prompts.   For any non-urgent questions, you may also contact your provider using MyChart. We now offer e-Visits for anyone 51 and older to request care online for non-urgent symptoms. For details visit [email or web address].   Also download the MyChart app! Go to the app store, search "MyChart", open the app, select [facility], and log in with your MyChart username and password.  Due to Covid, a mask is required upon entering the hospital/clinic. If you do not have a mask, one will be given to you upon arrival. For doctor visits, patients may have 1 support person aged 75 or older with them. For treatment visits, patients cannot have anyone with them due to current Covid guidelines and our immunocompromised population.   Blood Transfusion, Adult, Care After This sheet gives you information about how to care for yourself after your procedure. Your doctor may also give you more specific instructions. If you have problems or questions, contact your doctor. What can I expect after the procedure? After the procedure, it  is common to have: Bruising and soreness at the IV site. A headache. Follow these instructions at home: Insertion site care   Follow instructions from your doctor about how to take care of your insertion site. This is where an IV tube was put into your vein. Make sure you: Wash your hands with soap and water before  and after you change your bandage (dressing). If you cannot use soap and water, use hand sanitizer. Change your bandage as told by your doctor. Check your insertion site every day for signs of infection. Check for: Redness, swelling, or pain. Bleeding from the site. Warmth. Pus or a bad smell. General instructions Take over-the-counter and prescription medicines only as told by your doctor. Rest as told by your doctor. Go back to your normal activities as told by your doctor. Keep all follow-up visits as told by your doctor. This is important. Contact a doctor if: You have itching or red, swollen areas of skin (hives). You feel worried or nervous (anxious). You feel weak after doing your normal activities. You have redness, swelling, warmth, or pain around the insertion site. You have blood coming from the insertion site, and the blood does not stop with pressure. You have pus or a bad smell coming from the insertion site. Get help right away if: You have signs of a serious reaction. This may be coming from an allergy or the body's defense system (immune system). Signs include: Trouble breathing or shortness of breath. Swelling of the face or feeling warm (flushed). Fever or chills. Head, chest, or back pain. Dark pee (urine) or blood in the pee. Widespread rash. Fast heartbeat. Feeling dizzy or light-headed. You may receive your blood transfusion in an outpatient setting. If so, you will be told whom to contact to report any reactions. These symptoms may be an emergency. Do not wait to see if the symptoms will go away. Get medical help right away. Call your local emergency services (911 in the [place] Do not drive yourself to the hospital. Summary Bruising and soreness at the IV site are common. Check your insertion site every day for signs of infection. Rest as told by your doctor. Go back to your normal activities as told by your doctor. Get help right away if you have signs of a  serious reaction. This information is not intended to replace advice given to you by your health care provider. Make sure you discuss any questions you have with your health care provider. Document Revised: 03/06/2021 Document Reviewed: 05/04/2019 Elsevier Patient Education  [employer].

## 2021-08-08 LAB — TYPE AND SCREEN
ABO/RH(D): A NEG
Antibody Screen: NEGATIVE
Unit division: 0
Unit division: 0

## 2021-08-08 LAB — BPAM RBC
Blood Product Expiration Date: 202210092359
Blood Product Expiration Date: 202210092359
ISSUE DATE / TIME: 202209150733
ISSUE DATE / TIME: 202209150733
Unit Type and Rh: 600
Unit Type and Rh: 600

## 2021-08-12 ENCOUNTER — Other Ambulatory Visit: Payer: Self-pay

## 2021-08-12 ENCOUNTER — Inpatient Hospital Stay: Payer: 59

## 2021-08-12 DIAGNOSIS — Z5111 Encounter for antineoplastic chemotherapy: Secondary | ICD-10-CM | POA: Diagnosis not present

## 2021-08-12 DIAGNOSIS — C3412 Malignant neoplasm of upper lobe, left bronchus or lung: Secondary | ICD-10-CM

## 2021-08-12 DIAGNOSIS — Z95828 Presence of other vascular implants and grafts: Secondary | ICD-10-CM

## 2021-08-12 LAB — CMP (CANCER CENTER ONLY)
ALT: 16 U/L (ref 0–44)
AST: 25 U/L (ref 15–41)
Albumin: 3.7 g/dL (ref 3.5–5.0)
Alkaline Phosphatase: 90 U/L (ref 38–126)
Anion gap: 9 (ref 5–15)
BUN: 17 mg/dL (ref 6–20)
CO2: 26 mmol/L (ref 22–32)
Calcium: 10 mg/dL (ref 8.9–10.3)
Chloride: 101 mmol/L (ref 98–111)
Creatinine: 0.84 mg/dL (ref 0.61–1.24)
GFR, Estimated: >60 mL/min (ref >60)
Glucose, Bld: 122 mg/dL — ABNORMAL HIGH (ref 70–99)
Potassium: 4.1 mmol/L (ref 3.5–5.1)
Sodium: 136 mmol/L (ref 135–145)
Total Bilirubin: 0.7 mg/dL (ref 0.3–1.2)
Total Protein: 7.4 g/dL (ref 6.5–8.1)

## 2021-08-12 LAB — CBC WITH DIFFERENTIAL (CANCER CENTER ONLY)
Abs Immature Granulocytes: 0.04 10*3/uL (ref 0.00–0.07)
Basophils Absolute: 0 10*3/uL (ref 0.0–0.1)
Basophils Relative: 1 %
Eosinophils Absolute: 0 10*3/uL (ref 0.0–0.5)
Eosinophils Relative: 1 %
HCT: 29.4 % — ABNORMAL LOW (ref 39.0–52.0)
Hemoglobin: 10 g/dL — ABNORMAL LOW (ref 13.0–17.0)
Immature Granulocytes: 2 %
Lymphocytes Relative: 13 %
Lymphs Abs: 0.3 10*3/uL — ABNORMAL LOW (ref 0.7–4.0)
MCH: 32.2 pg (ref 26.0–34.0)
MCHC: 34 g/dL (ref 30.0–36.0)
MCV: 94.5 fL (ref 80.0–100.0)
Monocytes Absolute: 0.2 10*3/uL (ref 0.1–1.0)
Monocytes Relative: 7 %
Neutro Abs: 2.1 10*3/uL (ref 1.7–7.7)
Neutrophils Relative %: 76 %
Platelet Count: 175 10*3/uL (ref 150–400)
RBC: 3.11 MIL/uL — ABNORMAL LOW (ref 4.22–5.81)
RDW: 15.4 % (ref 11.5–15.5)
WBC Count: 2.7 10*3/uL — ABNORMAL LOW (ref 4.0–10.5)
nRBC: 0 % (ref 0.0–0.2)

## 2021-08-12 LAB — SAMPLE TO BLOOD BANK

## 2021-08-12 MED ORDER — HEPARIN SOD (PORK) LOCK FLUSH 100 UNIT/ML IV SOLN
500.0000 [IU] | Freq: Once | INTRAVENOUS | Status: AC
  Administered 2021-08-12: 500 [IU]

## 2021-08-12 MED ORDER — SODIUM CHLORIDE 0.9% FLUSH
10.0000 mL | Freq: Once | INTRAVENOUS | Status: AC
Start: 2021-08-12 — End: 2021-08-12
  Administered 2021-08-12: 10 mL

## 2021-08-14 ENCOUNTER — Other Ambulatory Visit: Payer: Self-pay | Admitting: Internal Medicine

## 2021-08-14 DIAGNOSIS — C3412 Malignant neoplasm of upper lobe, left bronchus or lung: Secondary | ICD-10-CM

## 2021-08-15 ENCOUNTER — Encounter: Payer: Self-pay | Admitting: Internal Medicine

## 2021-08-19 ENCOUNTER — Other Ambulatory Visit: Payer: Self-pay

## 2021-08-19 ENCOUNTER — Other Ambulatory Visit: Payer: Self-pay | Admitting: Physician Assistant

## 2021-08-19 ENCOUNTER — Inpatient Hospital Stay: Payer: 59

## 2021-08-19 DIAGNOSIS — D649 Anemia, unspecified: Secondary | ICD-10-CM

## 2021-08-19 DIAGNOSIS — Z5111 Encounter for antineoplastic chemotherapy: Secondary | ICD-10-CM | POA: Diagnosis not present

## 2021-08-19 DIAGNOSIS — C3412 Malignant neoplasm of upper lobe, left bronchus or lung: Secondary | ICD-10-CM

## 2021-08-19 DIAGNOSIS — Z95828 Presence of other vascular implants and grafts: Secondary | ICD-10-CM

## 2021-08-19 LAB — CMP (CANCER CENTER ONLY)
ALT: 23 U/L (ref 0–44)
AST: 28 U/L (ref 15–41)
Albumin: 3.7 g/dL (ref 3.5–5.0)
Alkaline Phosphatase: 106 U/L (ref 38–126)
Anion gap: 11 (ref 5–15)
BUN: 16 mg/dL (ref 6–20)
CO2: 25 mmol/L (ref 22–32)
Calcium: 9.6 mg/dL (ref 8.9–10.3)
Chloride: 103 mmol/L (ref 98–111)
Creatinine: 0.85 mg/dL (ref 0.61–1.24)
GFR, Estimated: >60 mL/min (ref >60)
Glucose, Bld: 130 mg/dL — ABNORMAL HIGH (ref 70–99)
Potassium: 4.3 mmol/L (ref 3.5–5.1)
Sodium: 139 mmol/L (ref 135–145)
Total Bilirubin: 0.4 mg/dL (ref 0.3–1.2)
Total Protein: 7.4 g/dL (ref 6.5–8.1)

## 2021-08-19 LAB — CBC WITH DIFFERENTIAL (CANCER CENTER ONLY)
Abs Immature Granulocytes: 0 10*3/uL (ref 0.00–0.07)
Basophils Absolute: 0 10*3/uL (ref 0.0–0.1)
Basophils Relative: 0 %
Eosinophils Absolute: 0 10*3/uL (ref 0.0–0.5)
Eosinophils Relative: 1 %
HCT: 24.9 % — ABNORMAL LOW (ref 39.0–52.0)
Hemoglobin: 8.8 g/dL — ABNORMAL LOW (ref 13.0–17.0)
Immature Granulocytes: 0 %
Lymphocytes Relative: 20 %
Lymphs Abs: 0.5 10*3/uL — ABNORMAL LOW (ref 0.7–4.0)
MCH: 33 pg (ref 26.0–34.0)
MCHC: 35.3 g/dL (ref 30.0–36.0)
MCV: 93.3 fL (ref 80.0–100.0)
Monocytes Absolute: 0.4 10*3/uL (ref 0.1–1.0)
Monocytes Relative: 18 %
Neutro Abs: 1.4 10*3/uL — ABNORMAL LOW (ref 1.7–7.7)
Neutrophils Relative %: 61 %
Platelet Count: 86 10*3/uL — ABNORMAL LOW (ref 150–400)
RBC: 2.67 MIL/uL — ABNORMAL LOW (ref 4.22–5.81)
RDW: 15.9 % — ABNORMAL HIGH (ref 11.5–15.5)
WBC Count: 2.3 10*3/uL — ABNORMAL LOW (ref 4.0–10.5)
nRBC: 0 % (ref 0.0–0.2)

## 2021-08-19 MED ORDER — HEPARIN SOD (PORK) LOCK FLUSH 100 UNIT/ML IV SOLN
500.0000 [IU] | Freq: Once | INTRAVENOUS | Status: AC
  Administered 2021-08-19: 500 [IU]

## 2021-08-19 MED ORDER — SODIUM CHLORIDE 0.9% FLUSH
10.0000 mL | Freq: Once | INTRAVENOUS | Status: AC
  Administered 2021-08-19: 10 mL

## 2021-08-25 ENCOUNTER — Other Ambulatory Visit: Payer: Self-pay

## 2021-08-25 ENCOUNTER — Inpatient Hospital Stay: Payer: 59

## 2021-08-25 ENCOUNTER — Encounter: Payer: Self-pay | Admitting: Internal Medicine

## 2021-08-25 ENCOUNTER — Inpatient Hospital Stay: Payer: 59 | Attending: Internal Medicine | Admitting: Internal Medicine

## 2021-08-25 VITALS — BP 103/63 | HR 83 | Temp 97.8°F | Resp 18

## 2021-08-25 VITALS — BP 95/69 | HR 98 | Temp 96.0°F | Resp 19 | Ht 72.0 in | Wt 144.9 lb

## 2021-08-25 DIAGNOSIS — E871 Hypo-osmolality and hyponatremia: Secondary | ICD-10-CM | POA: Diagnosis not present

## 2021-08-25 DIAGNOSIS — F17210 Nicotine dependence, cigarettes, uncomplicated: Secondary | ICD-10-CM | POA: Insufficient documentation

## 2021-08-25 DIAGNOSIS — Z923 Personal history of irradiation: Secondary | ICD-10-CM | POA: Insufficient documentation

## 2021-08-25 DIAGNOSIS — T451X5A Adverse effect of antineoplastic and immunosuppressive drugs, initial encounter: Secondary | ICD-10-CM | POA: Diagnosis not present

## 2021-08-25 DIAGNOSIS — C3412 Malignant neoplasm of upper lobe, left bronchus or lung: Secondary | ICD-10-CM | POA: Diagnosis not present

## 2021-08-25 DIAGNOSIS — Z95828 Presence of other vascular implants and grafts: Secondary | ICD-10-CM

## 2021-08-25 DIAGNOSIS — Z79899 Other long term (current) drug therapy: Secondary | ICD-10-CM | POA: Insufficient documentation

## 2021-08-25 DIAGNOSIS — Z5111 Encounter for antineoplastic chemotherapy: Secondary | ICD-10-CM

## 2021-08-25 DIAGNOSIS — Z8546 Personal history of malignant neoplasm of prostate: Secondary | ICD-10-CM | POA: Diagnosis not present

## 2021-08-25 DIAGNOSIS — E222 Syndrome of inappropriate secretion of antidiuretic hormone: Secondary | ICD-10-CM | POA: Diagnosis not present

## 2021-08-25 DIAGNOSIS — C7931 Secondary malignant neoplasm of brain: Secondary | ICD-10-CM | POA: Insufficient documentation

## 2021-08-25 DIAGNOSIS — D649 Anemia, unspecified: Secondary | ICD-10-CM

## 2021-08-25 DIAGNOSIS — I10 Essential (primary) hypertension: Secondary | ICD-10-CM | POA: Diagnosis not present

## 2021-08-25 DIAGNOSIS — D6481 Anemia due to antineoplastic chemotherapy: Secondary | ICD-10-CM

## 2021-08-25 DIAGNOSIS — C3490 Malignant neoplasm of unspecified part of unspecified bronchus or lung: Secondary | ICD-10-CM

## 2021-08-25 DIAGNOSIS — Z7982 Long term (current) use of aspirin: Secondary | ICD-10-CM | POA: Diagnosis not present

## 2021-08-25 LAB — SAMPLE TO BLOOD BANK

## 2021-08-25 LAB — CMP (CANCER CENTER ONLY)
ALT: 22 U/L (ref 0–44)
AST: 25 U/L (ref 15–41)
Albumin: 3.6 g/dL (ref 3.5–5.0)
Alkaline Phosphatase: 103 U/L (ref 38–126)
Anion gap: 10 (ref 5–15)
BUN: 14 mg/dL (ref 6–20)
CO2: 26 mmol/L (ref 22–32)
Calcium: 9.2 mg/dL (ref 8.9–10.3)
Chloride: 105 mmol/L (ref 98–111)
Creatinine: 0.83 mg/dL (ref 0.61–1.24)
GFR, Estimated: >60 mL/min (ref >60)
Glucose, Bld: 108 mg/dL — ABNORMAL HIGH (ref 70–99)
Potassium: 3.9 mmol/L (ref 3.5–5.1)
Sodium: 141 mmol/L (ref 135–145)
Total Bilirubin: 0.3 mg/dL (ref 0.3–1.2)
Total Protein: 7 g/dL (ref 6.5–8.1)

## 2021-08-25 LAB — CBC WITH DIFFERENTIAL (CANCER CENTER ONLY)
Abs Immature Granulocytes: 0.02 10*3/uL (ref 0.00–0.07)
Basophils Absolute: 0 10*3/uL (ref 0.0–0.1)
Basophils Relative: 1 %
Eosinophils Absolute: 0 10*3/uL (ref 0.0–0.5)
Eosinophils Relative: 2 %
HCT: 23.2 % — ABNORMAL LOW (ref 39.0–52.0)
Hemoglobin: 7.8 g/dL — ABNORMAL LOW (ref 13.0–17.0)
Immature Granulocytes: 1 %
Lymphocytes Relative: 18 %
Lymphs Abs: 0.4 10*3/uL — ABNORMAL LOW (ref 0.7–4.0)
MCH: 32.2 pg (ref 26.0–34.0)
MCHC: 33.6 g/dL (ref 30.0–36.0)
MCV: 95.9 fL (ref 80.0–100.0)
Monocytes Absolute: 0.6 10*3/uL (ref 0.1–1.0)
Monocytes Relative: 27 %
Neutro Abs: 1.1 10*3/uL — ABNORMAL LOW (ref 1.7–7.7)
Neutrophils Relative %: 51 %
Platelet Count: 207 10*3/uL (ref 150–400)
RBC: 2.42 MIL/uL — ABNORMAL LOW (ref 4.22–5.81)
RDW: 18.2 % — ABNORMAL HIGH (ref 11.5–15.5)
WBC Count: 2.1 10*3/uL — ABNORMAL LOW (ref 4.0–10.5)
nRBC: 0 % (ref 0.0–0.2)

## 2021-08-25 LAB — PREPARE RBC (CROSSMATCH)

## 2021-08-25 MED ORDER — ACETAMINOPHEN 325 MG PO TABS
ORAL_TABLET | ORAL | Status: AC
  Filled 2021-08-25: qty 2

## 2021-08-25 MED ORDER — SODIUM CHLORIDE 0.9 % IV SOLN
100.0000 mg/m2 | Freq: Once | INTRAVENOUS | Status: AC
  Administered 2021-08-25: 180 mg via INTRAVENOUS
  Filled 2021-08-25: qty 9

## 2021-08-25 MED ORDER — TRILACICLIB DIHYDROCHLORIDE INJECTION 300 MG
240.0000 mg/m2 | Freq: Once | INTRAVENOUS | Status: AC
  Administered 2021-08-25: 435 mg via INTRAVENOUS
  Filled 2021-08-25: qty 29

## 2021-08-25 MED ORDER — SODIUM CHLORIDE 0.9 % IV SOLN
573.0000 mg | Freq: Once | INTRAVENOUS | Status: AC
  Administered 2021-08-25: 570 mg via INTRAVENOUS
  Filled 2021-08-25: qty 57

## 2021-08-25 MED ORDER — FAMOTIDINE 20 MG IN NS 100 ML IVPB
20.0000 mg | Freq: Once | INTRAVENOUS | Status: AC
Start: 2021-08-25 — End: 2021-08-25
  Administered 2021-08-25: 20 mg via INTRAVENOUS
  Filled 2021-08-25: qty 100

## 2021-08-25 MED ORDER — DIPHENHYDRAMINE HCL 25 MG PO CAPS
50.0000 mg | ORAL_CAPSULE | Freq: Once | ORAL | Status: AC
Start: 2021-08-25 — End: 2021-08-25
  Administered 2021-08-25: 50 mg via ORAL
  Filled 2021-08-25: qty 2

## 2021-08-25 MED ORDER — HEPARIN SOD (PORK) LOCK FLUSH 100 UNIT/ML IV SOLN
500.0000 [IU] | Freq: Once | INTRAVENOUS | Status: AC | PRN
  Administered 2021-08-25: 500 [IU]

## 2021-08-25 MED ORDER — SODIUM CHLORIDE 0.9 % IV SOLN
10.0000 mg | Freq: Once | INTRAVENOUS | Status: AC
  Administered 2021-08-25: 10 mg via INTRAVENOUS
  Filled 2021-08-25: qty 10

## 2021-08-25 MED ORDER — SODIUM CHLORIDE 0.9 % IV SOLN: Freq: Once | INTRAVENOUS | Status: AC

## 2021-08-25 MED ORDER — ACETAMINOPHEN 325 MG PO TABS
650.0000 mg | ORAL_TABLET | Freq: Once | ORAL | Status: AC
  Administered 2021-08-25: 650 mg via ORAL

## 2021-08-25 MED ORDER — SODIUM CHLORIDE 0.9% FLUSH: 10.0000 mL | Freq: Once | INTRAVENOUS | Status: DC

## 2021-08-25 MED ORDER — SODIUM CHLORIDE 0.9% IV SOLUTION
250.0000 mL | Freq: Once | INTRAVENOUS | Status: AC
  Administered 2021-08-25: 250 mL via INTRAVENOUS

## 2021-08-25 MED ORDER — SODIUM CHLORIDE 0.9% FLUSH
10.0000 mL | INTRAVENOUS | Status: DC | PRN
  Administered 2021-08-25: 10 mL

## 2021-08-25 MED ORDER — SODIUM CHLORIDE 0.9 % IV SOLN
150.0000 mg | Freq: Once | INTRAVENOUS | Status: AC
  Administered 2021-08-25: 150 mg via INTRAVENOUS
  Filled 2021-08-25: qty 150

## 2021-08-25 MED ORDER — PALONOSETRON HCL INJECTION 0.25 MG/5ML
0.2500 mg | Freq: Once | INTRAVENOUS | Status: AC
  Administered 2021-08-25: 0.25 mg via INTRAVENOUS
  Filled 2021-08-25: qty 5

## 2021-08-25 NOTE — Progress Notes (Signed)
Per Dr Julien Nordmann please give  1 unit of blood today and blood may be administered at 300 ml/hr.

## 2021-08-25 NOTE — Progress Notes (Signed)
[place] [phone]   [phone]  OFFICE PROGRESS NOTE  [name], MD [place] [address]  DIAGNOSIS: Extensive stage (T2b, N2, M1c) small cell lung cancer presented with left upper lobe pulmonary nodule in addition to left hilar mass with mediastinal invasion and solitary brain metastasis diagnosed in May 2021  PRIOR THERAPY:  1) Palliative radiation to the left hilar and mediastinal lymphadenopathy under the care of Dr. Lisbeth Renshaw. 2) Cranial irradiation from 08/26/20-09/06/20 under the care of Dr. Lisbeth Renshaw 3) Palliative systemic chemotherapy with carboplatin for AUC of 5 on day 1, etoposide 100 mg/M2 on days 1, 2 and 3 as well as Imfinzi 1500 mg IV every 3 weeks with the chemotherapy.  First dose of chemotherapy May 06, 2020.  The patient will receive treatment with Cosela on the days of his chemotherapy.  Status post 14 cycles.  Starting from cycle #5 he started maintenance immunotherapy with Imfinzi 1500 mg IV every 4 weeks. Discontinued due to disease progression. Last dose 05/29/21.    CURRENT THERAPY: Carboplatin for an AUC of 5 and etoposide 100 mg/M2 on days 1, 2, and 3 IV every 3 weeks Neulasta or Cosela.  First dose expected on 06/24/2021.  Status post 3 cycles.  INTERVAL HISTORY: Richard Santos 60 y.o. male returns to the clinic today for follow-up visit.  The patient is feeling fine today with no concerning complaints except for fatigue.  He celebrated his 8 birthday recently.  He denied having any current chest pain, shortness of breath except with exertion with no cough or hemoptysis.  He denied having any fever or chills.  He has no nausea, vomiting, diarrhea or constipation.  He has no headache or visual changes.  Unfortunately continues to smoke 0.5 pack of cigarettes every day.  He is here today for evaluation before starting cycle #4 of his treatment.   MEDICAL HISTORY: Past Medical History:  Diagnosis Date    Allergic rhinitis    Allergy    Anxiety    Chronic low back pain    DDD (degenerative disc disease), lumbar    ED (erectile dysfunction)    Finger injury    cut pads off 3rd and 4th finger left hand/due to lawn mower accident   GERD (gastroesophageal reflux disease)    Heart murmur    as child only-    History of kidney stones    HTN (hypertension) 08/20/2016   Hyperlipidemia    Incomplete right bundle branch block    Prostate cancer ([facility]) dx 10/02/14   stage T1c   Sigmoid diverticulosis    Small cell lung cancer ([facility]) 03/2020   Wears glasses     ALLERGIES:  is allergic to bee venom, elemental sulfur, other, sulfa antibiotics, and namenda [memantine].  MEDICATIONS:  Current Outpatient Medications  Medication Sig Dispense Refill   acetaminophen (TYLENOL) 325 MG tablet Take 2 tablets (650 mg total) by mouth every 6 (six) hours as needed for mild pain (or Fever >/= 101). 30 tablet 0   aspirin EC 81 MG tablet Take 81 mg by mouth daily. Swallow whole.     BREZTRI AEROSPHERE 160-9-4.8 MCG/ACT AERO INHALE 2 PUFFS INTO THE LUNGS IN THE MORNING AND AT BEDTIME. 10.7 g 10   busPIRone (BUSPAR) 10 MG tablet Take 10 mg by mouth 2 (two) times daily.     famotidine (PEPCID) 20 MG tablet Take 1 tablet (20 mg total) by mouth 2 (two) times  daily. 60 tablet 2   Multiple Vitamin (MULTIVITAMIN) capsule Take 1 capsule by mouth daily.     ondansetron (ZOFRAN-ODT) 8 MG disintegrating tablet Take 1 tablet (8 mg total) by mouth every 8 (eight) hours as needed for nausea or vomiting. 30 tablet 2   pantoprazole (PROTONIX) 40 MG tablet Take 40 mg by mouth every morning.     prochlorperazine (COMPAZINE) 10 MG tablet TAKE 1 TABLET BY MOUTH EVERY 6 HOURS AS NEEDED 30 tablet 2   sodium bicarbonate 650 MG tablet TAKE 1 TABLET BY MOUTH 2 TIMES DAILY. 60 tablet 1   traMADol (ULTRAM) 50 MG tablet TAKE 1/2 TABLET BY MOUTH TWICE A DAY AS NEEDED FOR PAIN 60 tablet 2   No current facility-administered medications for  this visit.   Facility-Administered Medications Ordered in Other Visits  Medication Dose Route Frequency Provider Last Rate Last Admin   heparin lock flush 100 unit/mL  500 Units Intracatheter Daily PRN Curt Bears, MD       sodium chloride flush (NS) 0.9 % injection 10 mL  10 mL Intracatheter PRN Curt Bears, MD       sodium chloride flush (NS) 0.9 % injection 10 mL  10 mL Intracatheter PRN Curt Bears, MD   10 mL at 07/16/21 1606   sodium chloride flush (NS) 0.9 % injection 10 mL  10 mL Intracatheter Once Curt Bears, MD        SURGICAL HISTORY:  Past Surgical History:  Procedure Laterality Date   BRONCHIAL BIOPSY  04/12/2020   Procedure: BRONCHIAL BIOPSIES;  Surgeon: Marshell Garfinkel, MD;  Location: [facility];  Service: Cardiopulmonary;;   BRONCHIAL BRUSHINGS  04/12/2020   Procedure: BRONCHIAL BRUSHINGS;  Surgeon: Marshell Garfinkel, MD;  Location: [facility];  Service: Cardiopulmonary;;   BRONCHIAL NEEDLE ASPIRATION BIOPSY  04/12/2020   Procedure: BRONCHIAL NEEDLE ASPIRATION BIOPSIES;  Surgeon: Marshell Garfinkel, MD;  Location: [facility];  Service: Cardiopulmonary;;   BRONCHIAL WASHINGS  04/12/2020   Procedure: BRONCHIAL WASHINGS;  Surgeon: Marshell Garfinkel, MD;  Location: [facility];  Service: Cardiopulmonary;;   COLONOSCOPY     COLONOSCOPY W/ POLYPECTOMY  04-19-2012   ENDOBRONCHIAL ULTRASOUND N/A 04/12/2020   Procedure: ENDOBRONCHIAL ULTRASOUND;  Surgeon: Marshell Garfinkel, MD;  Location: [facility];  Service: Cardiopulmonary;  Laterality: N/A;   ESOPHAGOGASTRODUODENOSCOPY  08-05-2010   HEMOSTASIS CONTROL  04/12/2020   Procedure: HEMOSTASIS CONTROL;  Surgeon: Marshell Garfinkel, MD;  Location: [facility];  Service: Cardiopulmonary;;   IR IMAGING GUIDED PORT INSERTION  05/31/2020   POLYPECTOMY     PROSTATE BIOPSY  10/02/14   RADIOACTIVE SEED IMPLANT N/A 01/30/2015   Procedure: RADIOACTIVE SEED IMPLANT;  Surgeon: Rana Snare, MD;  Location: [facility];   Service: Urology;  Laterality: N/A;   UPPER GASTROINTESTINAL ENDOSCOPY  04/03/2021   VIDEO BRONCHOSCOPY N/A 04/12/2020   Procedure: VIDEO BRONCHOSCOPY WITHOUT FLUORO;  Surgeon: Marshell Garfinkel, MD;  Location: [facility];  Service: Cardiopulmonary;  Laterality: N/A;    REVIEW OF SYSTEMS:  Constitutional: positive for fatigue Eyes: negative Ears, nose, mouth, throat, and face: negative Respiratory: positive for dyspnea on exertion Cardiovascular: negative Gastrointestinal: negative Genitourinary:negative Integument/breast: negative Hematologic/lymphatic: negative Musculoskeletal:negative Neurological: negative Behavioral/Psych: negative Endocrine: negative Allergic/Immunologic: negative   PHYSICAL EXAMINATION: General appearance: alert, cooperative, fatigued, and no distress Head: Normocephalic, without obvious abnormality, atraumatic Neck: no adenopathy, no JVD, supple, symmetrical, trachea midline, and thyroid not enlarged, symmetric, no tenderness/mass/nodules Lymph nodes: Cervical, supraclavicular, and axillary nodes normal. Resp: clear to auscultation bilaterally Back: symmetric, no curvature. ROM  normal. No CVA tenderness. Cardio: regular rate and rhythm, S1, S2 normal, no murmur, click, rub or gallop GI: soft, non-tender; bowel sounds normal; no masses,  no organomegaly Extremities: extremities normal, atraumatic, no cyanosis or edema Neurologic: Alert and oriented X 3, normal strength and tone. Normal symmetric reflexes. Normal coordination and gait  ECOG PERFORMANCE STATUS: 1 - Symptomatic but completely ambulatory  Blood pressure 95/69, pulse 98, temperature (!) 96 F (35.6 C), temperature source Tympanic, resp. rate 19, height 6' (1.829 m), weight 144 lb 14.4 oz (65.7 kg), SpO2 100 %.  LABORATORY DATA: Lab Results  Component Value Date   WBC 2.1 (L) 08/25/2021   HGB 7.8 (L) 08/25/2021   HCT 23.2 (L) 08/25/2021   MCV 95.9 08/25/2021   PLT 207 08/25/2021       Chemistry      Component Value Date/Time   NA 139 08/19/2021 1119   K 4.3 08/19/2021 1119   CL 103 08/19/2021 1119   CO2 25 08/19/2021 1119   BUN 16 08/19/2021 1119   CREATININE 0.85 08/19/2021 1119      Component Value Date/Time   CALCIUM 9.6 08/19/2021 1119   ALKPHOS 106 08/19/2021 1119   AST 28 08/19/2021 1119   ALT 23 08/19/2021 1119   BILITOT 0.4 08/19/2021 1119       RADIOGRAPHIC STUDIES: CT Chest W Contrast  Result Date: 08/04/2021 CLINICAL DATA:  Restaging small cell lung cancer, ongoing chemotherapy. Prior radiation therapy and immunotherapy. EXAM: CT CHEST, ABDOMEN, AND PELVIS WITH CONTRAST TECHNIQUE: Multidetector CT imaging of the chest, abdomen and pelvis was performed following the standard protocol during bolus administration of intravenous contrast. CONTRAST:  80mL OMNIPAQUE IOHEXOL 350 MG/ML SOLN COMPARISON:  Multiple exams, including 05/22/2021 FINDINGS: CT CHEST FINDINGS Cardiovascular: Right Port-A-Cath tip: Lower SVC. Coronary, aortic arch, and branch vessel atherosclerotic vascular disease. Mediastinum/Nodes: Unremarkable Lungs/Pleura: Biapical pleuroparenchymal scarring with associated calcifications. Stable faint reticulonodular opacities peripherally in the right upper lobe. Paraseptal emphysema. Increased cavitation of the left apical nodule, currently with thin marginal walls and some internal septation and mild nodularity, measuring about 2.1 by 1.4 cm on image 42 series 4, previously thick-walled and by my measurement about 2.2 by 1.4 cm on prior exam. Substantial volume loss and paramediastinal bandlike density favoring fibrosis with associated traction bronchiectasis. Some prior left lower lobe nodularity along the posterior cardiophrenic sulcus has resolved. The previously cavitary lingular lesion seems to have collapsed and is now simply part of the paramediastinal fibrosis/consolidation. Some of the previous nodularity in the left upper lobe is indistinct or  incorporated into the paramediastinal fibrosis. This includes a nodular component along the top of the fibrosis measuring 1.1 by 0.7 cm, previously about 1.0 by 0.7 cm by my measurements. Musculoskeletal: Thoracic spondylosis. Stable 0.6 cm sclerotic lesion extent eccentric to the left in the T8 vertebral body. Faint sclerosis in the upper sternal body is not appreciably changed. 3 mm subcutaneous nodule or lymph node along the right anterior chest wall subcutaneous tissues on image 34 series 2. Subareolar density in the left chest measuring 0.6 cm anterior-posterior previously measured 1.1 cm anterior-posterior, and may be from gynecomastia but is technically nonspecific. New small sclerotic lesions in the left proximal humerus on image 8 series 2, largest 0.7 cm in diameter. CT ABDOMEN PELVIS FINDINGS Hepatobiliary: Unremarkable Pancreas: Unremarkable Spleen: Unremarkable Adrenals/Urinary Tract: Simple appearing right kidney lower pole cyst. Nondistended urinary bladder. 5 mm left mid upper kidney nonobstructive renal calculus. The left adrenal mass measures 1.6 by 0.8  cm, formerly 4.3 by 2.6 cm. Stomach/Bowel: Unremarkable Vascular/Lymphatic: Atherosclerosis is present, including aortoiliac atherosclerotic disease. Reproductive: Brachytherapy seed implants are distributed in the prostate gland. Other: No supplemental non-categorized findings. Musculoskeletal: New 1.1 cm sclerotic lesion of the right proximal femur on image 130 series 2. New 1.5 cm in diameter left femoral head sclerotic lesion on image 71 series 5. Degenerative disc disease and degenerative endplate sclerosis at Q3-R0 with evidence of right foraminal impingement at this level due to spurring. IMPRESSION: 1. Increasing cavitation of the left apical nodule, which previously had thick walls and only a small amount of internal gas, now mostly gas-filled with thin walls and some minimal internal nodularity. The prior cavitary lesion in the lingula  seems to have collapsed and is incorporated into the paramediastinal fibrosis region. 2. Marked improvement in the left adrenal mass, currently 1.6 by 0.8 cm and formerly 4.3 by 2.6 cm. 3. Some of the nodularity in the left upper lobe and left lower lobe is reduced or resolved. 4. However, in addition to the tiny sclerotic lesion in T8 and the mild sclerosis in the upper sternal body, there several new sclerotic lesions including in the left femoral head, the right proximal femoral metaphysis, and the left humeral head. Although a somewhat unusual distribution, the appearance must be concerning for the possibility of sclerotic metastatic lesions. Given the improvement in other areas and in particular the adrenal mass, it is possible that these represent lesions which were previously occult and have been effectively treated, leading to sclerosis. 5. Other imaging findings of potential clinical significance: Aortic Atherosclerosis (ICD10-I70.0) and Emphysema (ICD10-J43.9). Coronary atherosclerosis. Mild reticulonodular interstitial accentuation in the right lung. Nonobstructive left nephrolithiasis. Brachytherapy seeds in the prostate gland. Right foraminal impingement at L5-S1 from spurring. Electronically Signed   By: Van Clines M.D.   On: 08/04/2021 07:58   CT Abdomen Pelvis W Contrast  Result Date: 08/04/2021 CLINICAL DATA:  Restaging small cell lung cancer, ongoing chemotherapy. Prior radiation therapy and immunotherapy. EXAM: CT CHEST, ABDOMEN, AND PELVIS WITH CONTRAST TECHNIQUE: Multidetector CT imaging of the chest, abdomen and pelvis was performed following the standard protocol during bolus administration of intravenous contrast. CONTRAST:  25mL OMNIPAQUE IOHEXOL 350 MG/ML SOLN COMPARISON:  Multiple exams, including 05/22/2021 FINDINGS: CT CHEST FINDINGS Cardiovascular: Right Port-A-Cath tip: Lower SVC. Coronary, aortic arch, and branch vessel atherosclerotic vascular disease. Mediastinum/Nodes:  Unremarkable Lungs/Pleura: Biapical pleuroparenchymal scarring with associated calcifications. Stable faint reticulonodular opacities peripherally in the right upper lobe. Paraseptal emphysema. Increased cavitation of the left apical nodule, currently with thin marginal walls and some internal septation and mild nodularity, measuring about 2.1 by 1.4 cm on image 42 series 4, previously thick-walled and by my measurement about 2.2 by 1.4 cm on prior exam. Substantial volume loss and paramediastinal bandlike density favoring fibrosis with associated traction bronchiectasis. Some prior left lower lobe nodularity along the posterior cardiophrenic sulcus has resolved. The previously cavitary lingular lesion seems to have collapsed and is now simply part of the paramediastinal fibrosis/consolidation. Some of the previous nodularity in the left upper lobe is indistinct or incorporated into the paramediastinal fibrosis. This includes a nodular component along the top of the fibrosis measuring 1.1 by 0.7 cm, previously about 1.0 by 0.7 cm by my measurements. Musculoskeletal: Thoracic spondylosis. Stable 0.6 cm sclerotic lesion extent eccentric to the left in the T8 vertebral body. Faint sclerosis in the upper sternal body is not appreciably changed. 3 mm subcutaneous nodule or lymph node along the right anterior chest wall  subcutaneous tissues on image 34 series 2. Subareolar density in the left chest measuring 0.6 cm anterior-posterior previously measured 1.1 cm anterior-posterior, and may be from gynecomastia but is technically nonspecific. New small sclerotic lesions in the left proximal humerus on image 8 series 2, largest 0.7 cm in diameter. CT ABDOMEN PELVIS FINDINGS Hepatobiliary: Unremarkable Pancreas: Unremarkable Spleen: Unremarkable Adrenals/Urinary Tract: Simple appearing right kidney lower pole cyst. Nondistended urinary bladder. 5 mm left mid upper kidney nonobstructive renal calculus. The left adrenal mass  measures 1.6 by 0.8 cm, formerly 4.3 by 2.6 cm. Stomach/Bowel: Unremarkable Vascular/Lymphatic: Atherosclerosis is present, including aortoiliac atherosclerotic disease. Reproductive: Brachytherapy seed implants are distributed in the prostate gland. Other: No supplemental non-categorized findings. Musculoskeletal: New 1.1 cm sclerotic lesion of the right proximal femur on image 130 series 2. New 1.5 cm in diameter left femoral head sclerotic lesion on image 71 series 5. Degenerative disc disease and degenerative endplate sclerosis at Q4-O9 with evidence of right foraminal impingement at this level due to spurring. IMPRESSION: 1. Increasing cavitation of the left apical nodule, which previously had thick walls and only a small amount of internal gas, now mostly gas-filled with thin walls and some minimal internal nodularity. The prior cavitary lesion in the lingula seems to have collapsed and is incorporated into the paramediastinal fibrosis region. 2. Marked improvement in the left adrenal mass, currently 1.6 by 0.8 cm and formerly 4.3 by 2.6 cm. 3. Some of the nodularity in the left upper lobe and left lower lobe is reduced or resolved. 4. However, in addition to the tiny sclerotic lesion in T8 and the mild sclerosis in the upper sternal body, there several new sclerotic lesions including in the left femoral head, the right proximal femoral metaphysis, and the left humeral head. Although a somewhat unusual distribution, the appearance must be concerning for the possibility of sclerotic metastatic lesions. Given the improvement in other areas and in particular the adrenal mass, it is possible that these represent lesions which were previously occult and have been effectively treated, leading to sclerosis. 5. Other imaging findings of potential clinical significance: Aortic Atherosclerosis (ICD10-I70.0) and Emphysema (ICD10-J43.9). Coronary atherosclerosis. Mild reticulonodular interstitial accentuation in the right  lung. Nonobstructive left nephrolithiasis. Brachytherapy seeds in the prostate gland. Right foraminal impingement at L5-S1 from spurring. Electronically Signed   By: Van Clines M.D.   On: 08/04/2021 07:58     ASSESSMENT AND PLAN: This is a very pleasant 60 years old white male recently diagnosed with extensive stage small cell lung cancer presented with left upper lobe lung mass in addition to left hilar and mediastinal lymphadenopathy as well as solitary brain metastasis diagnosed in May 2021. He underwent palliative radiotherapy to the left hilar and mediastinal lymphadenopathy in addition to the radiotherapy to the brain metastasis.  He continues to have residual odynophagia and dysphagia from the palliative radiotherapy. He is currently undergoing systemic chemotherapy with carboplatin for AUC of 5 on day 1, etoposide 100 mg/M2 on days 1, 2 and 3 with Cosela infusion before chemotherapy.  He is status post 14 cycles of treatment  Starting from cycle #5 the patient is on treatment with maintenance Imfinzi every 4 weeks. 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  His treatment was discontinued. He started treatment with systemic chemotherapy again with carboplatin for AUC of 5 on day 1 and 2 etoposide 100 Mg/M2 on days, 1, 2 and 3 every 3 weeks with Cosela  status post 3 cycles. The patient continues to tolerate his treatment fairly well with no concerning complaints except for fatigue. I recommended for him to proceed with cycle #4 today as planned. For the chemotherapy-induced anemia, will arrange for the patient to receive 1 unit of PRBCs transfusion this week. For the hyponatremia and SIADH, his sodium has been within the normal range recently.  He will continue with the salt tablet. For the smoking cessation, I strongly encouraged the patient to quit smoking. He will come back for follow-up visit in  3 weeks for evaluation with repeat CT scan of the chest, abdomen and pelvis for restaging of his disease. The patient was advised to call immediately if he has any concerning symptoms in the interval.  The patient voices understanding of current disease status and treatment options and is in agreement with the current care plan.  All questions were answered. The patient knows to call the clinic with any problems, questions or concerns. We can certainly see the patient much sooner if necessary.   Disclaimer: This note was dictated with voice recognition software. Similar sounding words can inadvertently be transcribed and may not be corrected upon review.

## 2021-08-25 NOTE — Patient Instructions (Addendum)
[facility]   Discharge Instructions: Thank you for choosing [facility] to provide your oncology and hematology care.   If you have a lab appointment with the [facility], please go directly to the [facility] and check in at the registration area.   Wear comfortable clothing and clothing appropriate for easy access to any Portacath or PICC line.   We strive to give you quality time with your provider. You may need to reschedule your appointment if you arrive late (15 or more minutes).  Arriving late affects you and other patients whose appointments are after yours.  Also, if you miss three or more appointments without notifying the office, you may be dismissed from the clinic at the provider's discretion.      For prescription refill requests, have your pharmacy contact our office and allow 72 hours for refills to be completed.    Today you received the following chemotherapy and/or immunotherapy agents: Carboplatin (Paraplatin) and Etoposide (Vepesid).      To help prevent nausea and vomiting after your treatment, we encourage you to take your nausea medication as directed.  BELOW ARE SYMPTOMS THAT SHOULD BE REPORTED IMMEDIATELY: *FEVER GREATER THAN 100.4 F (38 C) OR HIGHER *CHILLS OR SWEATING *NAUSEA AND VOMITING THAT IS NOT CONTROLLED WITH YOUR NAUSEA MEDICATION *UNUSUAL SHORTNESS OF BREATH *UNUSUAL BRUISING OR BLEEDING *URINARY PROBLEMS (pain or burning when urinating, or frequent urination) *BOWEL PROBLEMS (unusual diarrhea, constipation, pain near the anus) TENDERNESS IN MOUTH AND THROAT WITH OR WITHOUT PRESENCE OF ULCERS (sore throat, sores in mouth, or a toothache) UNUSUAL RASH, SWELLING OR PAIN  UNUSUAL VAGINAL DISCHARGE OR ITCHING   Items with * indicate a potential emergency and should be followed up as soon as possible or go to the Emergency Department if any problems should occur.  Please show the CHEMOTHERAPY ALERT CARD  or IMMUNOTHERAPY ALERT CARD at check-in to the Emergency Department and triage nurse.  Should you have questions after your visit or need to cancel or reschedule your appointment, please contact [facility]  Dept: [phone]  and follow the prompts.  Office hours are 8:00 a.m. to 4:30 p.m. Monday - Friday. Please note that voicemails left after 4:00 p.m. may not be returned until the following business day.  We are closed weekends and major holidays. You have access to a nurse at all times for urgent questions. Please call the main number to the clinic Dept: [phone] and follow the prompts.   For any non-urgent questions, you may also contact your provider using MyChart. We now offer e-Visits for anyone 79 and older to request care online for non-urgent symptoms. For details visit [email or web address].   Also download the MyChart app! Go to the app store, search "MyChart", open the app, select [facility], and log in with your MyChart username and password.  Due to Covid, a mask is required upon entering the hospital/clinic. If you do not have a mask, one will be given to you upon arrival. For doctor visits, patients may have 1 support person aged 60 or older with them. For treatment visits, patients cannot have anyone with them due to current Covid guidelines and our immunocompromised population.    Blood Transfusion, Adult, Care After This sheet gives you information about how to care for yourself after your procedure. Your doctor may also give you more specific instructions. If you have problems or questions, contact your doctor. What can I expect after the  procedure? After the procedure, it is common to have: Bruising and soreness at the IV site. A headache. Follow these instructions at home: Insertion site care   Follow instructions from your doctor about how to take care of your insertion site. This is where an IV tube was put into your vein. Make  sure you: Wash your hands with soap and water before and after you change your bandage (dressing). If you cannot use soap and water, use hand sanitizer. Change your bandage as told by your doctor. Check your insertion site every day for signs of infection. Check for: Redness, swelling, or pain. Bleeding from the site. Warmth. Pus or a bad smell. General instructions Take over-the-counter and prescription medicines only as told by your doctor. Rest as told by your doctor. Go back to your normal activities as told by your doctor. Keep all follow-up visits as told by your doctor. This is important. Contact a doctor if: You have itching or red, swollen areas of skin (hives). You feel worried or nervous (anxious). You feel weak after doing your normal activities. You have redness, swelling, warmth, or pain around the insertion site. You have blood coming from the insertion site, and the blood does not stop with pressure. You have pus or a bad smell coming from the insertion site. Get help right away if: You have signs of a serious reaction. This may be coming from an allergy or the body's defense system (immune system). Signs include: Trouble breathing or shortness of breath. Swelling of the face or feeling warm (flushed). Fever or chills. Head, chest, or back pain. Dark pee (urine) or blood in the pee. Widespread rash. Fast heartbeat. Feeling dizzy or light-headed. You may receive your blood transfusion in an outpatient setting. If so, you will be told whom to contact to report any reactions. These symptoms may be an emergency. Do not wait to see if the symptoms will go away. Get medical help right away. Call your local emergency services (911 in the [place] Do not drive yourself to the hospital. Summary Bruising and soreness at the IV site are common. Check your insertion site every day for signs of infection. Rest as told by your doctor. Go back to your normal activities as told by  your doctor. Get help right away if you have signs of a serious reaction. This information is not intended to replace advice given to you by your health care provider. Make sure you discuss any questions you have with your health care provider. Document Revised: 03/06/2021 Document Reviewed: 05/04/2019 Elsevier Patient Education  [place].

## 2021-08-26 ENCOUNTER — Encounter: Payer: Self-pay | Admitting: Pulmonary Disease

## 2021-08-26 ENCOUNTER — Inpatient Hospital Stay: Payer: 59

## 2021-08-26 ENCOUNTER — Ambulatory Visit (INDEPENDENT_AMBULATORY_CARE_PROVIDER_SITE_OTHER): Payer: 59 | Admitting: Pulmonary Disease

## 2021-08-26 VITALS — BP 106/70 | HR 96 | Temp 98.2°F | Resp 18

## 2021-08-26 VITALS — BP 116/64 | HR 62 | Ht 72.0 in | Wt 150.8 lb

## 2021-08-26 DIAGNOSIS — J449 Chronic obstructive pulmonary disease, unspecified: Secondary | ICD-10-CM | POA: Diagnosis not present

## 2021-08-26 DIAGNOSIS — Z5111 Encounter for antineoplastic chemotherapy: Secondary | ICD-10-CM | POA: Diagnosis not present

## 2021-08-26 DIAGNOSIS — C3412 Malignant neoplasm of upper lobe, left bronchus or lung: Secondary | ICD-10-CM | POA: Diagnosis not present

## 2021-08-26 DIAGNOSIS — F17210 Nicotine dependence, cigarettes, uncomplicated: Secondary | ICD-10-CM

## 2021-08-26 MED ORDER — SODIUM CHLORIDE 0.9 % IV SOLN: Freq: Once | INTRAVENOUS | Status: AC

## 2021-08-26 MED ORDER — BREZTRI AEROSPHERE 160-9-4.8 MCG/ACT IN AERO: 2.0000 | INHALATION_SPRAY | Freq: Two times a day (BID) | RESPIRATORY_TRACT | 11 refills | Status: DC

## 2021-08-26 MED ORDER — SODIUM CHLORIDE 0.9 % IV SOLN
10.0000 mg | Freq: Once | INTRAVENOUS | Status: AC
  Administered 2021-08-26: 10 mg via INTRAVENOUS
  Filled 2021-08-26: qty 10

## 2021-08-26 MED ORDER — TRILACICLIB DIHYDROCHLORIDE INJECTION 300 MG
240.0000 mg/m2 | Freq: Once | INTRAVENOUS | Status: AC
  Administered 2021-08-26: 435 mg via INTRAVENOUS
  Filled 2021-08-26: qty 29

## 2021-08-26 MED ORDER — SODIUM CHLORIDE 0.9 % IV SOLN
100.0000 mg/m2 | Freq: Once | INTRAVENOUS | Status: AC
  Administered 2021-08-26: 180 mg via INTRAVENOUS
  Filled 2021-08-26: qty 9

## 2021-08-26 MED ORDER — HEPARIN SOD (PORK) LOCK FLUSH 100 UNIT/ML IV SOLN
500.0000 [IU] | Freq: Once | INTRAVENOUS | Status: AC | PRN
  Administered 2021-08-26: 500 [IU]

## 2021-08-26 MED ORDER — SODIUM CHLORIDE 0.9% FLUSH
10.0000 mL | INTRAVENOUS | Status: DC | PRN
  Administered 2021-08-26: 10 mL

## 2021-08-26 MED FILL — Dexamethasone Sodium Phosphate Inj 100 MG/10ML: INTRAMUSCULAR | Qty: 1 | Status: AC

## 2021-08-26 NOTE — Progress Notes (Signed)
Late entry from 08/25/2021:  Per Dr. Julien Nordmann, okay to treat with low ANC.

## 2021-08-26 NOTE — Patient Instructions (Signed)
I am glad you are stable with regard to your breathing Continue inhalers as prescribed Follow-up in 6 months.

## 2021-08-26 NOTE — Progress Notes (Signed)
Richard Santos    [id number]    [birth year]  Primary Care Physician:John, Hunt Oris, MD  Referring Physician: [name], MD [address] 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  On durvalumab COPD Active smoker  HPI: 60 year old smoker initially evaluated for lung mass status post bronchoscopy on 04/12/2020 with diagnosis of extensive stage small cell cancer.  Finished chemoradiation and is currently on immunotherapy under the care of Dr. Simon Rhein to smoke.  Has chronic dyspnea on exertion, chronic cough with white mucus  Pets: No pets Occupation: Adult nurse.  Currently on disability Exposures: Does have chemical exposures within his job but reports good ventilation Smoking history: 30-pack-year smoker, continues to smoke Travel history: No significant travel history Relevant family history: No significant family history of lung disease  Intermittently: Continues to smoke half pack per day Due to disease progression he has restarted systemic chemotherapy with carboplatin, etoposide under the care of Dr. Julien Nordmann.  States that breathing is stable with no issues  Outpatient Encounter Medications as of 08/26/2021  Medication Sig   acetaminophen (TYLENOL) 325 MG tablet Take 2 tablets (650 mg total) by mouth every 6 (six) hours as needed for mild pain (or Fever >/= 101).   aspirin EC 81 MG tablet Take 81 mg by mouth daily. Swallow whole.   BREZTRI AEROSPHERE 160-9-4.8 MCG/ACT AERO INHALE 2 PUFFS INTO THE LUNGS IN THE MORNING AND AT BEDTIME.   busPIRone (BUSPAR) 10 MG tablet Take 10 mg by mouth 2 (two) times daily.   famotidine (PEPCID) 20 MG tablet Take 1 tablet (20 mg total) by mouth 2 (two) times daily.   Multiple Vitamin (MULTIVITAMIN) capsule Take 1 capsule by mouth daily.   ondansetron (ZOFRAN-ODT) 8 MG disintegrating tablet Take 1 tablet (8 mg total) by mouth  every 8 (eight) hours as needed for nausea or vomiting.   pantoprazole (PROTONIX) 40 MG tablet Take 40 mg by mouth every morning.   prochlorperazine (COMPAZINE) 10 MG tablet TAKE 1 TABLET BY MOUTH EVERY 6 HOURS AS NEEDED   sodium bicarbonate 650 MG tablet TAKE 1 TABLET BY MOUTH 2 TIMES DAILY.   traMADol (ULTRAM) 50 MG tablet TAKE 1/2 TABLET BY MOUTH TWICE A DAY AS NEEDED FOR PAIN   Facility-Administered Encounter Medications as of 08/26/2021  Medication   [COMPLETED] 0.9 %  sodium chloride infusion   [COMPLETED] dexamethasone (DECADRON) 10 mg in sodium chloride 0.9 % 50 mL IVPB   [COMPLETED] etoposide (VEPESID) 180 mg in sodium chloride 0.9 % 500 mL chemo infusion   heparin lock flush 100 unit/mL   [COMPLETED] heparin lock flush 100 unit/mL   sodium chloride flush (NS) 0.9 % injection 10 mL   sodium chloride flush (NS) 0.9 % injection 10 mL   [COMPLETED] trilaciclib dihydrochloride (COSELA) 435 mg in dextrose 5 % 250 mL (1.5591 mg/mL) infusion   [DISCONTINUED] sodium chloride flush (NS) 0.9 % injection 10 mL   Physical Exam: Blood pressure 116/64, pulse 62, height 6' (1.829 m), weight 150 lb 12.8 oz (68.4 kg), SpO2 98 %. Gen:      No acute distress HEENT:  EOMI, sclera anicteric Neck:     No masses; no thyromegaly Lungs:    Clear to auscultation bilaterally; normal respiratory effort CV:         Regular rate and rhythm; no murmurs Abd:      +  bowel sounds; soft, non-tender; no palpable masses, no distension Ext:    No edema; adequate peripheral perfusion Skin:      Warm and dry; no rash Neuro: alert and oriented x 3 Psych: normal mood and affect  Data Reviewed: Imaging:  CT 01/20/2021-decrease in size of pulmonary nodule, evolving radiation changes in the left lung with decrease size of The lesion and lingula, new small left adrenal nodules  CT chest 08/01/2021-increasing cavitation left apical nodule, improvement in left adrenal mass, multiple sclerotic bone lesions. I have reviewed  the images personally.  PFTs: 08/15/2020 FVC 3.83 [76%], FEV1 2.74 [72%], F/F 72, TLC 6.04 [83%], DLCO 23.60 [81%] Moderate obstruction  Labs: CBC 08/08/2020-WBC 6.1, eos 4%, absolute eosinophil count 244  Assessment:  Moderate COPD Continue breztri  Active smoker Smoking cessation encouraged.  He is not ready to quit again.  Time spent counseling-5 minutes.  Reassess at return visit  Small cell lung cancer Currently on chemotherapy for progressive cancer Follows with Dr. Julien Nordmann  Plan/Recommendations: Continue breztri Smoking cessation  Marshell Garfinkel MD [facility] 08/26/2021, 2:46 PM  CC: [name], MD

## 2021-08-26 NOTE — Patient Instructions (Signed)
[facility]   Discharge Instructions: Thank you for choosing [facility] to provide your oncology and hematology care.   If you have a lab appointment with the [facility], please go directly to the [facility] and check in at the registration area.   Wear comfortable clothing and clothing appropriate for easy access to any Portacath or PICC line.   We strive to give you quality time with your provider. You may need to reschedule your appointment if you arrive late (15 or more minutes).  Arriving late affects you and other patients whose appointments are after yours.  Also, if you miss three or more appointments without notifying the office, you may be dismissed from the clinic at the provider's discretion.      For prescription refill requests, have your pharmacy contact our office and allow 72 hours for refills to be completed.    Today you received the following chemotherapy and/or immunotherapy agents:Etopside and Cosela      To help prevent nausea and vomiting after your treatment, we encourage you to take your nausea medication as directed.  BELOW ARE SYMPTOMS THAT SHOULD BE REPORTED IMMEDIATELY: *FEVER GREATER THAN 100.4 F (38 C) OR HIGHER *CHILLS OR SWEATING *NAUSEA AND VOMITING THAT IS NOT CONTROLLED WITH YOUR NAUSEA MEDICATION *UNUSUAL SHORTNESS OF BREATH *UNUSUAL BRUISING OR BLEEDING *URINARY PROBLEMS (pain or burning when urinating, or frequent urination) *BOWEL PROBLEMS (unusual diarrhea, constipation, pain near the anus) TENDERNESS IN MOUTH AND THROAT WITH OR WITHOUT PRESENCE OF ULCERS (sore throat, sores in mouth, or a toothache) UNUSUAL RASH, SWELLING OR PAIN  UNUSUAL VAGINAL DISCHARGE OR ITCHING   Items with * indicate a potential emergency and should be followed up as soon as possible or go to the Emergency Department if any problems should occur.  Please show the CHEMOTHERAPY ALERT CARD or IMMUNOTHERAPY ALERT CARD at  check-in to the Emergency Department and triage nurse.  Should you have questions after your visit or need to cancel or reschedule your appointment, please contact [facility]  Dept: [phone]  and follow the prompts.  Office hours are 8:00 a.m. to 4:30 p.m. Monday - Friday. Please note that voicemails left after 4:00 p.m. may not be returned until the following business day.  We are closed weekends and major holidays. You have access to a nurse at all times for urgent questions. Please call the main number to the clinic Dept: [phone] and follow the prompts.   For any non-urgent questions, you may also contact your provider using MyChart. We now offer e-Visits for anyone 62 and older to request care online for non-urgent symptoms. For details visit [email or web address].   Also download the MyChart app! Go to the app store, search "MyChart", open the app, select [facility], and log in with your MyChart username and password.  Due to Covid, a mask is required upon entering the hospital/clinic. If you do not have a mask, one will be given to you upon arrival. For doctor visits, patients may have 1 support person aged 58 or older with them. For treatment visits, patients cannot have anyone with them due to current Covid guidelines and our immunocompromised population.

## 2021-08-27 ENCOUNTER — Inpatient Hospital Stay: Payer: 59

## 2021-08-27 ENCOUNTER — Other Ambulatory Visit: Payer: Self-pay

## 2021-08-27 ENCOUNTER — Other Ambulatory Visit: Payer: Self-pay | Admitting: Medical Oncology

## 2021-08-27 VITALS — BP 129/75 | HR 78 | Temp 97.9°F | Resp 20

## 2021-08-27 DIAGNOSIS — R300 Dysuria: Secondary | ICD-10-CM

## 2021-08-27 DIAGNOSIS — Z5111 Encounter for antineoplastic chemotherapy: Secondary | ICD-10-CM | POA: Diagnosis not present

## 2021-08-27 DIAGNOSIS — C3412 Malignant neoplasm of upper lobe, left bronchus or lung: Secondary | ICD-10-CM

## 2021-08-27 LAB — URINALYSIS, COMPLETE (UACMP) WITH MICROSCOPIC
Bacteria, UA: NONE SEEN
Bilirubin Urine: NEGATIVE
Glucose, UA: 50 mg/dL — AB
Hgb urine dipstick: NEGATIVE
Ketones, ur: NEGATIVE mg/dL
Leukocytes,Ua: NEGATIVE
Nitrite: NEGATIVE
Protein, ur: NEGATIVE mg/dL
Specific Gravity, Urine: 1.018 (ref 1.005–1.030)
pH: 6 (ref 5.0–8.0)

## 2021-08-27 MED ORDER — SODIUM CHLORIDE 0.9% FLUSH
10.0000 mL | INTRAVENOUS | Status: DC | PRN
  Administered 2021-08-27: 10 mL

## 2021-08-27 MED ORDER — SODIUM CHLORIDE 0.9 % IV SOLN
Freq: Once | INTRAVENOUS | Status: AC
Start: 2021-08-27 — End: 2021-08-27

## 2021-08-27 MED ORDER — SODIUM CHLORIDE 0.9 % IV SOLN
10.0000 mg | Freq: Once | INTRAVENOUS | Status: AC
  Administered 2021-08-27: 10 mg via INTRAVENOUS
  Filled 2021-08-27: qty 10

## 2021-08-27 MED ORDER — SODIUM CHLORIDE 0.9 % IV SOLN
100.0000 mg/m2 | Freq: Once | INTRAVENOUS | Status: AC
  Administered 2021-08-27: 180 mg via INTRAVENOUS
  Filled 2021-08-27: qty 9

## 2021-08-27 MED ORDER — TRILACICLIB DIHYDROCHLORIDE INJECTION 300 MG
240.0000 mg/m2 | Freq: Once | INTRAVENOUS | Status: AC
  Administered 2021-08-27: 435 mg via INTRAVENOUS
  Filled 2021-08-27: qty 29

## 2021-08-27 MED ORDER — HEPARIN SOD (PORK) LOCK FLUSH 100 UNIT/ML IV SOLN
500.0000 [IU] | Freq: Once | INTRAVENOUS | Status: AC | PRN
  Administered 2021-08-27: 500 [IU]

## 2021-08-27 NOTE — Progress Notes (Signed)
Waste Occurrence: Date: 08/26/2021 Medication: Cosela 435mg  Reason: RN notified pharmacy that medication was wasted due to prime line was not clamped leaking medication in transport bag.   Finance: Alinda Dooms -notified for billing purposes.  Marland KitchenJuan Quam, CPhT IV Drug Replacement Specialist [facility] Phone: [phone]

## 2021-08-27 NOTE — Patient Instructions (Signed)
[facility]   Discharge Instructions: Thank you for choosing [facility] to provide your oncology and hematology care.   If you have a lab appointment with the [facility], please go directly to the [facility] and check in at the registration area.   Wear comfortable clothing and clothing appropriate for easy access to any Portacath or PICC line.   We strive to give you quality time with your provider. You may need to reschedule your appointment if you arrive late (15 or more minutes).  Arriving late affects you and other patients whose appointments are after yours.  Also, if you miss three or more appointments without notifying the office, you may be dismissed from the clinic at the provider's discretion.      For prescription refill requests, have your pharmacy contact our office and allow 72 hours for refills to be completed.    Today you received the following chemotherapy and/or immunotherapy agents:Etopside and Cosela      To help prevent nausea and vomiting after your treatment, we encourage you to take your nausea medication as directed.  BELOW ARE SYMPTOMS THAT SHOULD BE REPORTED IMMEDIATELY: *FEVER GREATER THAN 100.4 F (38 C) OR HIGHER *CHILLS OR SWEATING *NAUSEA AND VOMITING THAT IS NOT CONTROLLED WITH YOUR NAUSEA MEDICATION *UNUSUAL SHORTNESS OF BREATH *UNUSUAL BRUISING OR BLEEDING *URINARY PROBLEMS (pain or burning when urinating, or frequent urination) *BOWEL PROBLEMS (unusual diarrhea, constipation, pain near the anus) TENDERNESS IN MOUTH AND THROAT WITH OR WITHOUT PRESENCE OF ULCERS (sore throat, sores in mouth, or a toothache) UNUSUAL RASH, SWELLING OR PAIN  UNUSUAL VAGINAL DISCHARGE OR ITCHING   Items with * indicate a potential emergency and should be followed up as soon as possible or go to the Emergency Department if any problems should occur.  Please show the CHEMOTHERAPY ALERT CARD or IMMUNOTHERAPY ALERT CARD at  check-in to the Emergency Department and triage nurse.  Should you have questions after your visit or need to cancel or reschedule your appointment, please contact [facility]  Dept: [phone]  and follow the prompts.  Office hours are 8:00 a.m. to 4:30 p.m. Monday - Friday. Please note that voicemails left after 4:00 p.m. may not be returned until the following business day.  We are closed weekends and major holidays. You have access to a nurse at all times for urgent questions. Please call the main number to the clinic Dept: [phone] and follow the prompts.   For any non-urgent questions, you may also contact your provider using MyChart. We now offer e-Visits for anyone 20 and older to request care online for non-urgent symptoms. For details visit [email or web address].   Also download the MyChart app! Go to the app store, search "MyChart", open the app, select [facility], and log in with your MyChart username and password.  Due to Covid, a mask is required upon entering the hospital/clinic. If you do not have a mask, one will be given to you upon arrival. For doctor visits, patients may have 1 support person aged 22 or older with them. For treatment visits, patients cannot have anyone with them due to current Covid guidelines and our immunocompromised population.

## 2021-08-29 LAB — BPAM RBC
Blood Product Expiration Date: 202210242359
Blood Product Expiration Date: 202210262359
ISSUE DATE / TIME: 202210031422
Unit Type and Rh: 600
Unit Type and Rh: 600

## 2021-08-29 LAB — TYPE AND SCREEN
ABO/RH(D): A NEG
Antibody Screen: NEGATIVE
Unit division: 0
Unit division: 0

## 2021-09-01 ENCOUNTER — Inpatient Hospital Stay: Payer: 59

## 2021-09-01 ENCOUNTER — Other Ambulatory Visit: Payer: Self-pay

## 2021-09-01 DIAGNOSIS — C3412 Malignant neoplasm of upper lobe, left bronchus or lung: Secondary | ICD-10-CM

## 2021-09-01 DIAGNOSIS — Z5111 Encounter for antineoplastic chemotherapy: Secondary | ICD-10-CM | POA: Diagnosis not present

## 2021-09-01 DIAGNOSIS — Z95828 Presence of other vascular implants and grafts: Secondary | ICD-10-CM

## 2021-09-01 DIAGNOSIS — D649 Anemia, unspecified: Secondary | ICD-10-CM

## 2021-09-01 LAB — CBC WITH DIFFERENTIAL (CANCER CENTER ONLY)
Abs Immature Granulocytes: 0.04 10*3/uL (ref 0.00–0.07)
Basophils Absolute: 0 10*3/uL (ref 0.0–0.1)
Basophils Relative: 0 %
Eosinophils Absolute: 0 10*3/uL (ref 0.0–0.5)
Eosinophils Relative: 1 %
HCT: 27.3 % — ABNORMAL LOW (ref 39.0–52.0)
Hemoglobin: 9.1 g/dL — ABNORMAL LOW (ref 13.0–17.0)
Immature Granulocytes: 1 %
Lymphocytes Relative: 13 %
Lymphs Abs: 0.4 10*3/uL — ABNORMAL LOW (ref 0.7–4.0)
MCH: 31.8 pg (ref 26.0–34.0)
MCHC: 33.3 g/dL (ref 30.0–36.0)
MCV: 95.5 fL (ref 80.0–100.0)
Monocytes Absolute: 0.1 10*3/uL (ref 0.1–1.0)
Monocytes Relative: 4 %
Neutro Abs: 2.3 10*3/uL (ref 1.7–7.7)
Neutrophils Relative %: 81 %
Platelet Count: 165 10*3/uL (ref 150–400)
RBC: 2.86 MIL/uL — ABNORMAL LOW (ref 4.22–5.81)
RDW: 17.3 % — ABNORMAL HIGH (ref 11.5–15.5)
WBC Count: 2.9 10*3/uL — ABNORMAL LOW (ref 4.0–10.5)
nRBC: 0 % (ref 0.0–0.2)

## 2021-09-01 LAB — CMP (CANCER CENTER ONLY)
ALT: 17 U/L (ref 0–44)
AST: 23 U/L (ref 15–41)
Albumin: 3.5 g/dL (ref 3.5–5.0)
Alkaline Phosphatase: 85 U/L (ref 38–126)
Anion gap: 8 (ref 5–15)
BUN: 21 mg/dL — ABNORMAL HIGH (ref 6–20)
CO2: 27 mmol/L (ref 22–32)
Calcium: 9.6 mg/dL (ref 8.9–10.3)
Chloride: 103 mmol/L (ref 98–111)
Creatinine: 0.78 mg/dL (ref 0.61–1.24)
GFR, Estimated: >60 mL/min (ref >60)
Glucose, Bld: 109 mg/dL — ABNORMAL HIGH (ref 70–99)
Potassium: 4.3 mmol/L (ref 3.5–5.1)
Sodium: 138 mmol/L (ref 135–145)
Total Bilirubin: 0.5 mg/dL (ref 0.3–1.2)
Total Protein: 7 g/dL (ref 6.5–8.1)

## 2021-09-01 LAB — SAMPLE TO BLOOD BANK

## 2021-09-01 MED ORDER — SODIUM CHLORIDE 0.9% FLUSH
10.0000 mL | Freq: Once | INTRAVENOUS | Status: AC
  Administered 2021-09-01: 10 mL

## 2021-09-01 MED ORDER — HEPARIN SOD (PORK) LOCK FLUSH 100 UNIT/ML IV SOLN
500.0000 [IU] | Freq: Once | INTRAVENOUS | Status: AC
  Administered 2021-09-01: 500 [IU]

## 2021-09-05 ENCOUNTER — Other Ambulatory Visit: Payer: Self-pay

## 2021-09-05 ENCOUNTER — Ambulatory Visit
Admission: RE | Admit: 2021-09-05 | Discharge: 2021-09-05 | Disposition: A | Discharge status: Home / Self Care | Payer: 59 | Source: Ambulatory Visit | Attending: Radiation Oncology | Admitting: Radiation Oncology

## 2021-09-05 DIAGNOSIS — C7931 Secondary malignant neoplasm of brain: Secondary | ICD-10-CM

## 2021-09-05 MED ORDER — HEPARIN SOD (PORK) LOCK FLUSH 100 UNIT/ML IV SOLN
500.0000 [IU] | Freq: Once | INTRAVENOUS | Status: AC
  Administered 2021-09-05: 500 [IU] via INTRAVENOUS

## 2021-09-05 MED ORDER — SODIUM CHLORIDE 0.9% FLUSH
10.0000 mL | INTRAVENOUS | Status: DC | PRN
  Administered 2021-09-05: 10 mL via INTRAVENOUS

## 2021-09-05 MED ORDER — GADOBENATE DIMEGLUMINE 529 MG/ML IV SOLN
14.0000 mL | Freq: Once | INTRAVENOUS | Status: AC | PRN
  Administered 2021-09-05: 14 mL via INTRAVENOUS

## 2021-09-07 ENCOUNTER — Other Ambulatory Visit: Payer: Self-pay | Admitting: Internal Medicine

## 2021-09-07 DIAGNOSIS — C3412 Malignant neoplasm of upper lobe, left bronchus or lung: Secondary | ICD-10-CM

## 2021-09-08 ENCOUNTER — Other Ambulatory Visit: Payer: Self-pay

## 2021-09-08 ENCOUNTER — Telehealth: Payer: Self-pay | Admitting: Physician Assistant

## 2021-09-08 ENCOUNTER — Other Ambulatory Visit: Payer: Self-pay | Admitting: *Deleted

## 2021-09-08 ENCOUNTER — Ambulatory Visit
Admission: RE | Admit: 2021-09-08 | Discharge: 2021-09-08 | Disposition: A | Discharge status: Home / Self Care | Payer: 59 | Source: Ambulatory Visit | Attending: Radiation Oncology | Admitting: Radiation Oncology

## 2021-09-08 ENCOUNTER — Inpatient Hospital Stay: Payer: 59

## 2021-09-08 ENCOUNTER — Encounter: Payer: Self-pay | Admitting: Radiation Oncology

## 2021-09-08 DIAGNOSIS — C3412 Malignant neoplasm of upper lobe, left bronchus or lung: Secondary | ICD-10-CM

## 2021-09-08 DIAGNOSIS — Z95828 Presence of other vascular implants and grafts: Secondary | ICD-10-CM

## 2021-09-08 DIAGNOSIS — D649 Anemia, unspecified: Secondary | ICD-10-CM

## 2021-09-08 DIAGNOSIS — Z5111 Encounter for antineoplastic chemotherapy: Secondary | ICD-10-CM | POA: Diagnosis not present

## 2021-09-08 DIAGNOSIS — C7931 Secondary malignant neoplasm of brain: Secondary | ICD-10-CM

## 2021-09-08 LAB — CBC WITH DIFFERENTIAL (CANCER CENTER ONLY)
Abs Immature Granulocytes: 0.02 10*3/uL (ref 0.00–0.07)
Basophils Absolute: 0 10*3/uL (ref 0.0–0.1)
Basophils Relative: 1 %
Eosinophils Absolute: 0 10*3/uL (ref 0.0–0.5)
Eosinophils Relative: 1 %
HCT: 22.4 % — ABNORMAL LOW (ref 39.0–52.0)
Hemoglobin: 8 g/dL — ABNORMAL LOW (ref 13.0–17.0)
Immature Granulocytes: 1 %
Lymphocytes Relative: 21 %
Lymphs Abs: 0.4 10*3/uL — ABNORMAL LOW (ref 0.7–4.0)
MCH: 32.9 pg (ref 26.0–34.0)
MCHC: 35.7 g/dL (ref 30.0–36.0)
MCV: 92.2 fL (ref 80.0–100.0)
Monocytes Absolute: 0.4 10*3/uL (ref 0.1–1.0)
Monocytes Relative: 21 %
Neutro Abs: 1.1 10*3/uL — ABNORMAL LOW (ref 1.7–7.7)
Neutrophils Relative %: 55 %
Platelet Count: 51 10*3/uL — ABNORMAL LOW (ref 150–400)
RBC: 2.43 MIL/uL — ABNORMAL LOW (ref 4.22–5.81)
RDW: 17.1 % — ABNORMAL HIGH (ref 11.5–15.5)
Smear Review: NORMAL
WBC Count: 2 10*3/uL — ABNORMAL LOW (ref 4.0–10.5)
nRBC: 0 % (ref 0.0–0.2)

## 2021-09-08 LAB — CMP (CANCER CENTER ONLY)
ALT: 21 U/L (ref 0–44)
AST: 25 U/L (ref 15–41)
Albumin: 3.8 g/dL (ref 3.5–5.0)
Alkaline Phosphatase: 105 U/L (ref 38–126)
Anion gap: 10 (ref 5–15)
BUN: 25 mg/dL — ABNORMAL HIGH (ref 6–20)
CO2: 27 mmol/L (ref 22–32)
Calcium: 9.7 mg/dL (ref 8.9–10.3)
Chloride: 103 mmol/L (ref 98–111)
Creatinine: 0.92 mg/dL (ref 0.61–1.24)
GFR, Estimated: >60 mL/min (ref >60)
Glucose, Bld: 125 mg/dL — ABNORMAL HIGH (ref 70–99)
Potassium: 4.2 mmol/L (ref 3.5–5.1)
Sodium: 140 mmol/L (ref 135–145)
Total Bilirubin: 0.4 mg/dL (ref 0.3–1.2)
Total Protein: 7.5 g/dL (ref 6.5–8.1)

## 2021-09-08 LAB — SAMPLE TO BLOOD BANK

## 2021-09-08 MED ORDER — HEPARIN SOD (PORK) LOCK FLUSH 100 UNIT/ML IV SOLN
500.0000 [IU] | Freq: Once | INTRAVENOUS | Status: AC
Start: 2021-09-08 — End: 2021-09-08
  Administered 2021-09-08: 500 [IU]

## 2021-09-08 MED ORDER — SODIUM CHLORIDE 0.9% FLUSH
10.0000 mL | Freq: Once | INTRAVENOUS | Status: AC
  Administered 2021-09-08: 10 mL

## 2021-09-08 MED ORDER — METHYLPREDNISOLONE 4 MG PO TABS: ORAL_TABLET | ORAL | 0 refills | Status: DC

## 2021-09-08 NOTE — Progress Notes (Signed)
Radiation Oncology         [phone] ________________________________  Outpatient Reconsultation - Conducted via telephone due to current COVID-19 concerns for limiting patient exposure  I spoke with the patient to conduct this consult visit via telephone to spare the patient unnecessary potential exposure in the healthcare setting during the current COVID-19 pandemic. The patient was notified in advance and was offered a [place] meeting to allow for face to face communication but unfortunately reported that they did not have the appropriate resources/technology to support such a visit and instead preferred to proceed with a telephone visit.  Name: Richard Santos        MRN: [id number]  Date of Service: 09/08/2021 DOB: 1961/08/28  DJ:TTSV, Hunt Oris, MD  [name], MD     REFERRING PHYSICIAN: [name], MD   DIAGNOSIS: The primary encounter diagnosis was Small cell lung cancer, left upper lobe ([place]). A diagnosis of Metastatic cancer to brain [facility]) was also pertinent to this visit.   HISTORY OF PRESENT ILLNESS: Richard Santos is a 60 y.o. male  with a history of extensive stage small cell lung cancer.  The patient had a history of prostate cancer that was treated in 2016 with radioactive seed placement.  He was diagnosed with small cell carcinoma in May 2021 and received chemoradiation in the summer 2021, during his work-up he did have a 6 mm lesion in the brain along the cerebellum, however with repeat imaging on 08/03/2020, this was felt to be rather a subacute infarct and measured 4 mm.  He was counseled following the course of chemoradiation on prophylactic cranial irradiation and received this regimen in October 2021.  Of note he was unable to tolerate Namenda. Over the summer of 2022 he was found to have disease in the left adrenal gland and his surveillance brain MRI on 05/15/21  showed 5 new lesions in the brain the largest was 9 mm in the left temporoparietal lobe with edema. The  other lesions were 4 mm in the right temporal lobe, 5 mm in the right lateral basal ganglia/external capsule, 3.5 mm left frontal lobe, and 3 mm left head of caudate. The prior area described in the past as a subacute infarct was read as a treated lesion without recurrence.   He proceeded with salvage SRS on 05/30/21 and also completed palliative radiotherapy to the left adrenal gland on 06/09/21. 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 He is contacted today to review his most recent MRI brain which was performed on 09/05/21. This showed no new disease and improvement in the previously treated disease, with only one or two sites showing enhancement, the largest was 5 mm in the left occipital lobe, previously 9 mm. He did have persistent bilateral mastoid effusions. He's contacted today by phone to discuss these results.   PREVIOUS RADIATION THERAPY: Yes   05/30/21 SRS Treatment: The Following sites were treated with Salvage SRS to 20 Gy each in a single fraction. PTV_1TemporalR PTV_2TemporR PTV_3GangliaR PTV_4FrontR  PTV_5CaudateL  05/15/21-06/09/21:  The patient received 37.5 Gy in a palliative radiotherapy course over 15 fractions to the left adrenal gland.  08/26/20-09/06/20 Prophylactic Cranial Irradiation (PCI)Treatment: The patient's whole brain was treated to 25 Gy in 10 fractions    04/25/20 - 05/15/20: The patient was treated to the disease within the left lung initially to a dose of 37.5 Gy in 15 fractions using a 3 field, 3-D conformal technique.  2016:  The prostate was treated with radioactive seeds with the following prescription: Prostate 14,500 cGy. Isotope: I-125 utilizing 79 seeds and 29 active needles.  Individual seed activity 0.47 mCi per seed for a total implant activity of 37.13 mCi.  PAST MEDICAL HISTORY:  Past Medical History:  Diagnosis Date   Allergic rhinitis    Allergy    Anxiety    Chronic low back pain    DDD  (degenerative disc disease), lumbar    ED (erectile dysfunction)    Finger injury    cut pads off 3rd and 4th finger left hand/due to lawn mower accident   GERD (gastroesophageal reflux disease)    Heart murmur    as child only-    History of kidney stones    HTN (hypertension) 08/20/2016   Hyperlipidemia    Incomplete right bundle branch block    Prostate cancer ([place]) dx 10/02/14   stage T1c   Sigmoid diverticulosis    Small cell lung cancer ([place]) 03/2020   Wears glasses        PAST SURGICAL HISTORY: Past Surgical History:  Procedure Laterality Date   BRONCHIAL BIOPSY  04/12/2020   Procedure: BRONCHIAL BIOPSIES;  Surgeon: Marshell Garfinkel, MD;  Location: [place];  Service: Cardiopulmonary;;   BRONCHIAL BRUSHINGS  04/12/2020   Procedure: BRONCHIAL BRUSHINGS;  Surgeon: Marshell Garfinkel, MD;  Location: [facility];  Service: Cardiopulmonary;;   BRONCHIAL NEEDLE ASPIRATION BIOPSY  04/12/2020   Procedure: BRONCHIAL NEEDLE ASPIRATION BIOPSIES;  Surgeon: Marshell Garfinkel, MD;  Location: [place];  Service: Cardiopulmonary;;   BRONCHIAL WASHINGS  04/12/2020   Procedure: BRONCHIAL WASHINGS;  Surgeon: Marshell Garfinkel, MD;  Location: [place] [facility];  Service: Cardiopulmonary;;   COLONOSCOPY     COLONOSCOPY W/ POLYPECTOMY  04-19-2012   ENDOBRONCHIAL ULTRASOUND N/A 04/12/2020   Procedure: ENDOBRONCHIAL ULTRASOUND;  Surgeon: Marshell Garfinkel, MD;  Location: [facility];  Service: Cardiopulmonary;  Laterality: N/A;   ESOPHAGOGASTRODUODENOSCOPY  08-05-2010   HEMOSTASIS CONTROL  04/12/2020   Procedure: HEMOSTASIS CONTROL;  Surgeon: Marshell Garfinkel, MD;  Location: [place];  Service: Cardiopulmonary;;   IR IMAGING GUIDED PORT INSERTION  05/31/2020   POLYPECTOMY     PROSTATE BIOPSY  10/02/14   RADIOACTIVE SEED IMPLANT N/A 01/30/2015   Procedure: RADIOACTIVE SEED IMPLANT;  Surgeon: Rana Snare, MD;  Location: [facility];  Service: Urology;  Laterality: N/A;   UPPER  GASTROINTESTINAL ENDOSCOPY  04/03/2021   VIDEO BRONCHOSCOPY N/A 04/12/2020   Procedure: VIDEO BRONCHOSCOPY WITHOUT FLUORO;  Surgeon: Marshell Garfinkel, MD;  Location: [place];  Service: Cardiopulmonary;  Laterality: N/A;     FAMILY HISTORY:  Family History  Problem Relation Age of Onset   Prostate cancer Father    Heart disease Father    Heart disease Mother    Breast cancer Sister    Heart disease Other    Colon cancer Neg Hx    Colon polyps Neg Hx    Esophageal cancer Neg Hx    Rectal cancer Neg Hx    Stomach cancer Neg Hx      SOCIAL HISTORY:  reports that he has been smoking cigarettes. He has a 12.50 pack-year smoking history. He has never used smokeless tobacco. He reports current alcohol use of about 49.0 standard drinks per week. He reports that he does not use drugs. The patient is married and lives in [place]. 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   ALLERGIES: Bee venom, Elemental sulfur, Other, Sulfa antibiotics, and Namenda [memantine]  MEDICATIONS:  Current Outpatient Medications  Medication Sig Dispense Refill   acetaminophen (TYLENOL) 325 MG tablet Take 2 tablets (650 mg total) by mouth every 6 (six) hours as needed for mild pain (or Fever >/= 101). 30 tablet 0   aspirin EC 81 MG tablet Take 81 mg by mouth daily. Swallow whole.     Budeson-Glycopyrrol-Formoterol (BREZTRI AEROSPHERE) 160-9-4.8 MCG/ACT AERO Inhale 2 puffs into the lungs in the morning and at bedtime. 10.7 g 11   busPIRone (BUSPAR) 10 MG tablet Take 10 mg by mouth 2 (two) times daily.     famotidine (PEPCID) 20 MG tablet Take 1 tablet (20 mg total) by mouth 2 (two) times daily. 60 tablet 2   Multiple Vitamin (MULTIVITAMIN) capsule Take 1 capsule by mouth daily.     ondansetron (ZOFRAN-ODT) 8 MG disintegrating tablet Take 1 tablet (8 mg total) by mouth every 8 (eight) hours as needed for nausea or vomiting. 30 tablet 2   pantoprazole (PROTONIX) 40 MG tablet Take 40 mg by mouth every  morning.     prochlorperazine (COMPAZINE) 10 MG tablet TAKE 1 TABLET BY MOUTH EVERY 6 HOURS AS NEEDED 30 tablet 2   sodium bicarbonate 650 MG tablet TAKE 1 TABLET BY MOUTH TWICE A DAY 60 tablet 1   traMADol (ULTRAM) 50 MG tablet TAKE 1/2 TABLET BY MOUTH TWICE A DAY AS NEEDED FOR PAIN 60 tablet 2   No current facility-administered medications for this encounter.   Facility-Administered Medications Ordered in Other Encounters  Medication Dose Route Frequency Provider Last Rate Last Admin   heparin lock flush 100 unit/mL  500 Units Intracatheter Daily PRN Curt Bears, MD       sodium chloride flush (NS) 0.9 % injection 10 mL  10 mL Intracatheter PRN Curt Bears, MD       sodium chloride flush (NS) 0.9 % injection 10 mL  10 mL Intracatheter PRN Curt Bears, MD   10 mL at 07/16/21 1606     REVIEW OF SYSTEMS:   On review of systems the patient reports that he is doing very well.  He is not experiencing any headaches or changes in vision at this time.  He does state that he continues to have tinnitus and feeling like his hearing is muffled.  He has previously tried over-the-counter decongestants that I recommended but did not have improvement with this.  He also feels like his ears are not producing wax like previously.  He also reports that he is still very fatigued, he is hoping that he might benefit from additional blood products once he has his lab work drawn today.  He is taking salt tablets and feels like since the radiation to the adrenal gland this has significantly helped his sodium levels.  No other complaints are verbalized.  PHYSICAL EXAM:  Wt Readings from Last 3 Encounters:  08/26/21 150 lb 12.8 oz (68.4 kg)  08/25/21 144 lb 14.4 oz (65.7 kg)  08/04/21 143 lb 4.8 oz (65 kg)   Unable to assess due to encounter type.   ECOG = 1  0 - Asymptomatic (Fully active, able to carry on all predisease activities without restriction)  1 - Symptomatic but completely ambulatory  (Restricted in physically strenuous activity but ambulatory and able to carry out work of a light or sedentary nature. For example, light housework, office work)  2 - Symptomatic, <50% in bed during the day (Ambulatory and capable of all self care but unable to carry out any work activities. Up  and about more than 50% of waking hours)  3 - Symptomatic, >50% in bed, but not bedbound (Capable of only limited self-care, confined to bed or chair 50% or more of waking hours)  4 - Bedbound (Completely disabled. Cannot carry on any self-care. Totally confined to bed or chair)  5 - Death   Eustace Pen MM, Creech RH, Tormey DC, et al. [phone]). "Toxicity and response criteria of the [facility]". [place] Oncol. 5 (6): 649-55    LABORATORY DATA:  Lab Results  Component Value Date   WBC 2.9 (L) 09/01/2021   HGB 9.1 (L) 09/01/2021   HCT 27.3 (L) 09/01/2021   MCV 95.5 09/01/2021   PLT 165 09/01/2021   Lab Results  Component Value Date   NA 138 09/01/2021   K 4.3 09/01/2021   CL 103 09/01/2021   CO2 27 09/01/2021   Lab Results  Component Value Date   ALT 17 09/01/2021   AST 23 09/01/2021   ALKPHOS 85 09/01/2021   BILITOT 0.5 09/01/2021      RADIOGRAPHY: MR Brain W Wo Contrast  Result Date: 09/07/2021 CLINICAL DATA:  60 year old male with extensive stage small cell lung cancer. Status post whole brain radiation in October 2021, with suspicion of a subcentimeter left cerebellar brain metastasis on initial cancer staging. Five new subcentimeter enhancing lesions in June. Restaging. EXAM: MRI HEAD WITHOUT AND WITH CONTRAST TECHNIQUE: Multiplanar, multiecho pulse sequences of the brain and surrounding structures were obtained without and with intravenous contrast. CONTRAST:  28mL MULTIHANCE GADOBENATE DIMEGLUMINE 529 MG/ML IV SOLN COMPARISON:  Brain MRI 05/15/2021 and earlier. FINDINGS: BRAIN New Lesions: None. Larger lesions: None. Stable or Smaller lesions: Enhancing left  lateral occipital lobe lesion, now 5 mm (previously 9 mm) on series 11, image 58. No edema. Punctate right external capsule enhancing lesion on image 91. No edema. Only punctate hemosiderin identified now at the site of the treated right temporal operculum lesion (series 6, image 47). And similar finding at the left inferior frontal gyrus lesion (series 6, image 63). No other abnormal enhancement identified. Small right parietal lobe developmental venous anomaly (normal variant) redemonstrated on series 12, image 117). No dural thickening. Other Brain findings: No restricted diffusion to suggest acute infarction. No midline shift, mass effect,ventriculomegaly, extra-axial collection or acute intracranial hemorrhage. Cervicomedullary junction and pituitary are within normal limits. Patchy and scattered cerebral white matter T2 and FLAIR hyperintensity is stable, some of which is typical of chronic small vessel disease (posterior left corona radiata). No cortical encephalomalacia identified. Vascular: Major intracranial vascular flow voids are stable. The major dural venous sinuses appear to be enhancing and patent. Skull and upper cervical spine: Visualized bone marrow signal is within normal limits. Negative visible cervical spine. Sinuses/Orbits: Stable, negative. Other: Bilateral mastoid effusions are stable. Grossly normal visible internal auditory structures. Less retained secretions in the nasopharynx which otherwise appears negative. Negative visible scalp and face soft tissues. IMPRESSION: 1. Regression of disease. Only one or two out of the 5 treated metastases in June are enhancing now, the largest now 5 mm in the left occipital lobe. No edema or mass effect. See series 11. 2. No new metastatic disease or intracranial abnormality identified. Electronically Signed   By: Genevie Ann M.D.   On: 09/07/2021 09:22        IMPRESSION/PLAN: 1. Extensive stage small cell lung cancer arising in the left lung.   The  patient appears to be doing well radiographically, we discussed continued follow-up with  repeat imaging in 3 months.  Given his insurance coverage he requests having the MRI scan repeated at the end of the year as he will be having to switch insurances as well.  I think that this is very reasonable.  He will also continue with blood work this afternoon to see if he is able to resume cisplatin etoposide. 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  He continues his salt tablets and medical management with Dr. Julien Nordmann.  We will follow this expectantly. 3. Bilateral mastoid effusions and tinnitus. We discussed a trial of a medrol dosepak and I've sent this into his pharmacy. If he is still having symptoms we can refer him to ENT.  Given current concerns for patient exposure during the COVID-19 pandemic, this encounter was conducted via telephone.  The patient has provided two factor identification and has given verbal consent for this type of encounter and has been advised to only accept a meeting of this type in a secure network environment. The time spent during this encounter was 30 minutes including preparation, discussion, and coordination of the patient's care. The attendants for this meeting include   Hayden Pedro  and THOMAS MABRY and his wife Malekai Markwood. During the encounter  Hayden Pedro was located at [facility] Department.  CHADDRICK BRUE was located at home with his wife [name].     Carola Rhine, PAC

## 2021-09-08 NOTE — Progress Notes (Addendum)
Patient reports shortness of breath, dizziness, and fatigue. No other symptoms reported at this time.  Meaningful use complete.  Patient notified of 3:00pm-09/08/21 telephone appointment and verbalized understanding.  Patient request that he be called post 3:45pm due to having blood work on the [employer] at [employer] will still be on the road.

## 2021-09-08 NOTE — Progress Notes (Signed)
Pt hgb 8. Pt is symptomatic. Provider made aware. Awaiting appt for RBC transfusion

## 2021-09-08 NOTE — Telephone Encounter (Signed)
I called the patient to let him know that we are working on arranging for 2 units of blood for him either at our location or at our other cancer centers.  I should know more about the date and time tomorrow.  The meantime, the patient denies any abnormal bleeding or bruising.  He is feeling fatigued.  He was advised not to take the blue bracelet off as he will need this in order to have a blood transfusion.  He expressed understanding.

## 2021-09-09 ENCOUNTER — Other Ambulatory Visit: Payer: Self-pay | Admitting: Physician Assistant

## 2021-09-09 ENCOUNTER — Other Ambulatory Visit: Payer: Self-pay | Admitting: Radiation Therapy

## 2021-09-09 DIAGNOSIS — D649 Anemia, unspecified: Secondary | ICD-10-CM

## 2021-09-09 LAB — PREPARE RBC (CROSSMATCH)

## 2021-09-09 NOTE — Progress Notes (Signed)
I called the patient to relay his blood transfusion appointment with him. This will be at the [place] [facility] on 09/11/21 at 8:30. Advised to arrive at least 15 minutes early. He knows not to take the blue bracelet off. He is aware of the appointment.

## 2021-09-10 ENCOUNTER — Encounter: Payer: Self-pay | Admitting: Internal Medicine

## 2021-09-10 ENCOUNTER — Telehealth: Payer: Self-pay | Admitting: *Deleted

## 2021-09-10 NOTE — Telephone Encounter (Signed)
Pt left message needing clarification about upcoming CT scan on Friday 09/12/21.  Spoke with [place], PA. Called pt back and informed pt that he does need to keep CT scan appt for 09/12/21.  Per Cassie, Dr. Julien Nordmann would like to have repeated scan after every 2 treatments in pt's situation.  Pt voiced understanding. Pt's   phone   [phone].

## 2021-09-11 ENCOUNTER — Other Ambulatory Visit: Payer: Self-pay

## 2021-09-11 ENCOUNTER — Inpatient Hospital Stay: Payer: 59

## 2021-09-11 DIAGNOSIS — D649 Anemia, unspecified: Secondary | ICD-10-CM

## 2021-09-11 DIAGNOSIS — Z5111 Encounter for antineoplastic chemotherapy: Secondary | ICD-10-CM | POA: Diagnosis not present

## 2021-09-11 MED ORDER — SODIUM CHLORIDE 0.9% FLUSH: 3.0000 mL | INTRAVENOUS | Status: DC | PRN

## 2021-09-11 MED ORDER — HEPARIN SOD (PORK) LOCK FLUSH 100 UNIT/ML IV SOLN
250.0000 [IU] | INTRAVENOUS | Status: AC | PRN
  Administered 2021-09-11: 250 [IU]

## 2021-09-11 MED ORDER — SODIUM CHLORIDE 0.9% IV SOLUTION
250.0000 mL | Freq: Once | INTRAVENOUS | Status: AC
Start: 2021-09-11 — End: 2021-09-11
  Administered 2021-09-11: 250 mL via INTRAVENOUS

## 2021-09-11 MED ORDER — SODIUM CHLORIDE 0.9% FLUSH
10.0000 mL | INTRAVENOUS | Status: AC | PRN
  Administered 2021-09-11: 10 mL

## 2021-09-11 MED ORDER — ACETAMINOPHEN 325 MG PO TABS
650.0000 mg | ORAL_TABLET | Freq: Once | ORAL | Status: AC
  Administered 2021-09-11: 650 mg via ORAL
  Filled 2021-09-11: qty 2

## 2021-09-11 MED ORDER — DIPHENHYDRAMINE HCL 25 MG PO CAPS
25.0000 mg | ORAL_CAPSULE | Freq: Once | ORAL | Status: AC
Start: 2021-09-11 — End: 2021-09-11
  Administered 2021-09-11: 25 mg via ORAL
  Filled 2021-09-11: qty 1

## 2021-09-11 NOTE — Patient Instructions (Signed)
Blood Transfusion, Adult, Care After This sheet gives you information about how to care for yourself after your procedure. Your doctor may also give you more specific instructions. If you have problems or questions, contact your doctor. What can I expect after the procedure? After the procedure, it is common to have: Bruising and soreness at the IV site. A headache. Follow these instructions at home: Insertion site care   Follow instructions from your doctor about how to take care of your insertion site. This is where an IV tube was put into your vein. Make sure you: Wash your hands with soap and water before and after you change your bandage (dressing). If you cannot use soap and water, use hand sanitizer. Change your bandage as told by your doctor. Check your insertion site every day for signs of infection. Check for: Redness, swelling, or pain. Bleeding from the site. Warmth. Pus or a bad smell. General instructions Take over-the-counter and prescription medicines only as told by your doctor. Rest as told by your doctor. Go back to your normal activities as told by your doctor. Keep all follow-up visits as told by your doctor. This is important. Contact a doctor if: You have itching or red, swollen areas of skin (hives). You feel worried or nervous (anxious). You feel weak after doing your normal activities. You have redness, swelling, warmth, or pain around the insertion site. You have blood coming from the insertion site, and the blood does not stop with pressure. You have pus or a bad smell coming from the insertion site. Get help right away if: You have signs of a serious reaction. This may be coming from an allergy or the body's defense system (immune system). Signs include: Trouble breathing or shortness of breath. Swelling of the face or feeling warm (flushed). Fever or chills. Head, chest, or back pain. Dark pee (urine) or blood in the pee. Widespread rash. Fast  heartbeat. Feeling dizzy or light-headed. You may receive your blood transfusion in an outpatient setting. If so, you will be told whom to contact to report any reactions. These symptoms may be an emergency. Do not wait to see if the symptoms will go away. Get medical help right away. Call your local emergency services (911 in the [place] Do not drive yourself to the hospital. Summary Bruising and soreness at the IV site are common. Check your insertion site every day for signs of infection. Rest as told by your doctor. Go back to your normal activities as told by your doctor. Get help right away if you have signs of a serious reaction. This information is not intended to replace advice given to you by your health care provider. Make sure you discuss any questions you have with your health care provider. Document Revised: 03/06/2021 Document Reviewed: 05/04/2019 Elsevier Patient Education  2022 [employer]  

## 2021-09-12 ENCOUNTER — Ambulatory Visit (HOSPITAL_COMMUNITY)
Admission: RE | Admit: 2021-09-12 | Discharge: 2021-09-12 | Disposition: A | Discharge status: Home / Self Care | Payer: 59 | Source: Ambulatory Visit | Attending: Internal Medicine | Admitting: Internal Medicine

## 2021-09-12 ENCOUNTER — Encounter (HOSPITAL_COMMUNITY): Payer: Self-pay

## 2021-09-12 ENCOUNTER — Other Ambulatory Visit: Payer: Self-pay | Admitting: Internal Medicine

## 2021-09-12 DIAGNOSIS — C3490 Malignant neoplasm of unspecified part of unspecified bronchus or lung: Secondary | ICD-10-CM | POA: Diagnosis present

## 2021-09-12 DIAGNOSIS — C61 Malignant neoplasm of prostate: Secondary | ICD-10-CM

## 2021-09-12 DIAGNOSIS — C3412 Malignant neoplasm of upper lobe, left bronchus or lung: Secondary | ICD-10-CM

## 2021-09-12 LAB — TYPE AND SCREEN
ABO/RH(D): A NEG
Antibody Screen: NEGATIVE
Unit division: 0
Unit division: 0

## 2021-09-12 LAB — BPAM RBC
Blood Product Expiration Date: 202211022359
Blood Product Expiration Date: 202211022359
ISSUE DATE / TIME: 202210200745
ISSUE DATE / TIME: 202210200745
Unit Type and Rh: 600
Unit Type and Rh: 600

## 2021-09-12 MED ORDER — HEPARIN SOD (PORK) LOCK FLUSH 100 UNIT/ML IV SOLN
INTRAVENOUS | Status: AC
  Filled 2021-09-12: qty 5

## 2021-09-12 MED ORDER — HEPARIN SOD (PORK) LOCK FLUSH 100 UNIT/ML IV SOLN
500.0000 [IU] | Freq: Once | INTRAVENOUS | Status: AC
  Administered 2021-09-12: 500 [IU] via INTRAVENOUS

## 2021-09-12 MED ORDER — IOHEXOL 9 MG/ML PO SOLN
ORAL | Status: AC
  Filled 2021-09-12: qty 1000

## 2021-09-12 MED ORDER — IOHEXOL 350 MG/ML SOLN
80.0000 mL | Freq: Once | INTRAVENOUS | Status: AC | PRN
  Administered 2021-09-12: 80 mL via INTRAVENOUS

## 2021-09-12 MED ORDER — IOHEXOL 9 MG/ML PO SOLN
1000.0000 mL | ORAL | Status: AC
  Administered 2021-09-12: 1000 mL via ORAL

## 2021-09-15 ENCOUNTER — Inpatient Hospital Stay: Payer: 59

## 2021-09-15 ENCOUNTER — Inpatient Hospital Stay (HOSPITAL_BASED_OUTPATIENT_CLINIC_OR_DEPARTMENT_OTHER): Payer: 59 | Admitting: Internal Medicine

## 2021-09-15 ENCOUNTER — Encounter: Payer: Self-pay | Admitting: Internal Medicine

## 2021-09-15 ENCOUNTER — Other Ambulatory Visit: Payer: Self-pay

## 2021-09-15 VITALS — BP 113/78 | HR 89 | Temp 95.9°F | Resp 19 | Ht 72.0 in | Wt 145.5 lb

## 2021-09-15 DIAGNOSIS — E222 Syndrome of inappropriate secretion of antidiuretic hormone: Secondary | ICD-10-CM | POA: Diagnosis not present

## 2021-09-15 DIAGNOSIS — T451X5A Adverse effect of antineoplastic and immunosuppressive drugs, initial encounter: Secondary | ICD-10-CM

## 2021-09-15 DIAGNOSIS — C7931 Secondary malignant neoplasm of brain: Secondary | ICD-10-CM | POA: Diagnosis not present

## 2021-09-15 DIAGNOSIS — C3412 Malignant neoplasm of upper lobe, left bronchus or lung: Secondary | ICD-10-CM

## 2021-09-15 DIAGNOSIS — Z5111 Encounter for antineoplastic chemotherapy: Secondary | ICD-10-CM | POA: Diagnosis not present

## 2021-09-15 DIAGNOSIS — Z95828 Presence of other vascular implants and grafts: Secondary | ICD-10-CM

## 2021-09-15 DIAGNOSIS — C61 Malignant neoplasm of prostate: Secondary | ICD-10-CM

## 2021-09-15 DIAGNOSIS — D6481 Anemia due to antineoplastic chemotherapy: Secondary | ICD-10-CM | POA: Diagnosis not present

## 2021-09-15 LAB — CMP (CANCER CENTER ONLY)
ALT: 23 U/L (ref 0–44)
AST: 28 U/L (ref 15–41)
Albumin: 3.9 g/dL (ref 3.5–5.0)
Alkaline Phosphatase: 77 U/L (ref 38–126)
Anion gap: 7 (ref 5–15)
BUN: 17 mg/dL (ref 6–20)
CO2: 29 mmol/L (ref 22–32)
Calcium: 9.2 mg/dL (ref 8.9–10.3)
Chloride: 102 mmol/L (ref 98–111)
Creatinine: 0.74 mg/dL (ref 0.61–1.24)
GFR, Estimated: >60 mL/min (ref >60)
Glucose, Bld: 99 mg/dL (ref 70–99)
Potassium: 4 mmol/L (ref 3.5–5.1)
Sodium: 138 mmol/L (ref 135–145)
Total Bilirubin: 0.4 mg/dL (ref 0.3–1.2)
Total Protein: 7.4 g/dL (ref 6.5–8.1)

## 2021-09-15 LAB — CBC WITH DIFFERENTIAL (CANCER CENTER ONLY)
Abs Immature Granulocytes: 0.02 10*3/uL (ref 0.00–0.07)
Basophils Absolute: 0 10*3/uL (ref 0.0–0.1)
Basophils Relative: 1 %
Eosinophils Absolute: 0 10*3/uL (ref 0.0–0.5)
Eosinophils Relative: 2 %
HCT: 29 % — ABNORMAL LOW (ref 39.0–52.0)
Hemoglobin: 9.9 g/dL — ABNORMAL LOW (ref 13.0–17.0)
Immature Granulocytes: 1 %
Lymphocytes Relative: 15 %
Lymphs Abs: 0.3 10*3/uL — ABNORMAL LOW (ref 0.7–4.0)
MCH: 31.8 pg (ref 26.0–34.0)
MCHC: 34.1 g/dL (ref 30.0–36.0)
MCV: 93.2 fL (ref 80.0–100.0)
Monocytes Absolute: 0.6 10*3/uL (ref 0.1–1.0)
Monocytes Relative: 25 %
Neutro Abs: 1.3 10*3/uL — ABNORMAL LOW (ref 1.7–7.7)
Neutrophils Relative %: 56 %
Platelet Count: 166 10*3/uL (ref 150–400)
RBC: 3.11 MIL/uL — ABNORMAL LOW (ref 4.22–5.81)
RDW: 17.5 % — ABNORMAL HIGH (ref 11.5–15.5)
WBC Count: 2.2 10*3/uL — ABNORMAL LOW (ref 4.0–10.5)
nRBC: 0 % (ref 0.0–0.2)

## 2021-09-15 MED ORDER — SODIUM CHLORIDE 0.9 % IV SOLN
100.0000 mg/m2 | Freq: Once | INTRAVENOUS | Status: AC
  Administered 2021-09-15: 180 mg via INTRAVENOUS
  Filled 2021-09-15: qty 9

## 2021-09-15 MED ORDER — TRILACICLIB DIHYDROCHLORIDE INJECTION 300 MG
240.0000 mg/m2 | Freq: Once | INTRAVENOUS | Status: AC
  Administered 2021-09-15: 435 mg via INTRAVENOUS
  Filled 2021-09-15: qty 29

## 2021-09-15 MED ORDER — DIPHENHYDRAMINE HCL 25 MG PO CAPS
50.0000 mg | ORAL_CAPSULE | Freq: Once | ORAL | Status: AC
  Administered 2021-09-15: 50 mg via ORAL
  Filled 2021-09-15: qty 2

## 2021-09-15 MED ORDER — SODIUM CHLORIDE 0.9 % IV SOLN: Freq: Once | INTRAVENOUS | Status: AC

## 2021-09-15 MED ORDER — FAMOTIDINE 20 MG IN NS 100 ML IVPB
20.0000 mg | Freq: Once | INTRAVENOUS | Status: AC
  Administered 2021-09-15: 20 mg via INTRAVENOUS
  Filled 2021-09-15: qty 100

## 2021-09-15 MED ORDER — PALONOSETRON HCL INJECTION 0.25 MG/5ML
0.2500 mg | Freq: Once | INTRAVENOUS | Status: AC
  Administered 2021-09-15: 0.25 mg via INTRAVENOUS
  Filled 2021-09-15: qty 5

## 2021-09-15 MED ORDER — SODIUM CHLORIDE 0.9 % IV SOLN
10.0000 mg | Freq: Once | INTRAVENOUS | Status: AC
  Administered 2021-09-15: 10 mg via INTRAVENOUS
  Filled 2021-09-15: qty 10

## 2021-09-15 MED ORDER — SODIUM CHLORIDE 0.9% FLUSH
10.0000 mL | INTRAVENOUS | Status: DC | PRN
  Administered 2021-09-15: 10 mL

## 2021-09-15 MED ORDER — HEPARIN SOD (PORK) LOCK FLUSH 100 UNIT/ML IV SOLN
500.0000 [IU] | Freq: Once | INTRAVENOUS | Status: AC | PRN
  Administered 2021-09-15: 500 [IU]

## 2021-09-15 MED ORDER — SODIUM CHLORIDE 0.9 % IV SOLN
570.0000 mg | Freq: Once | INTRAVENOUS | Status: AC
  Administered 2021-09-15: 570 mg via INTRAVENOUS
  Filled 2021-09-15: qty 57

## 2021-09-15 MED ORDER — SODIUM CHLORIDE 0.9 % IV SOLN
150.0000 mg | Freq: Once | INTRAVENOUS | Status: AC
  Administered 2021-09-15: 150 mg via INTRAVENOUS
  Filled 2021-09-15: qty 150

## 2021-09-15 MED ORDER — SODIUM CHLORIDE 0.9% FLUSH
10.0000 mL | Freq: Once | INTRAVENOUS | Status: AC
Start: 2021-09-15 — End: 2021-09-15
  Administered 2021-09-15: 10 mL

## 2021-09-15 NOTE — Progress Notes (Signed)
[facility] [phone]   [phone]  OFFICE PROGRESS NOTE  [name], MD [place] [address]  DIAGNOSIS: Extensive stage (T2b, N2, M1c) small cell lung cancer presented with left upper lobe pulmonary nodule in addition to left hilar mass with mediastinal invasion and solitary brain metastasis diagnosed in May 2021  PRIOR THERAPY:  1) Palliative radiation to the left hilar and mediastinal lymphadenopathy under the care of Dr. Lisbeth Renshaw. 2) Cranial irradiation from 08/26/20-09/06/20 under the care of Dr. Lisbeth Renshaw 3) Palliative systemic chemotherapy with carboplatin for AUC of 5 on day 1, etoposide 100 mg/M2 on days 1, 2 and 3 as well as Imfinzi 1500 mg IV every 3 weeks with the chemotherapy.  First dose of chemotherapy May 06, 2020.  The patient will receive treatment with Cosela on the days of his chemotherapy.  Status post 14 cycles.  Starting from cycle #5 he started maintenance immunotherapy with Imfinzi 1500 mg IV every 4 weeks. Discontinued due to disease progression. Last dose 05/29/21.    CURRENT THERAPY: Carboplatin for an AUC of 5 and etoposide 100 mg/M2 on days 1, 2, and 3 IV every 3 weeks Neulasta or Cosela.  First dose expected on 06/24/2021.  Status post 4 cycles.  INTERVAL HISTORY: Richard Santos 60 y.o. male returns to the clinic today for follow-up visit accompanied by his wife.  The patient is feeling fine today with no concerning complaints except for fatigue.  His fatigue was secondary to chemotherapy-induced anemia and he received 2 units of PRBCs transfusion recently.  He denied having any current chest pain, shortness of breath except with exertion with no cough or hemoptysis.  He denied having any fever or chills.  He has no nausea, vomiting, diarrhea or constipation.  He has no headache or visual changes.  He denied having any visual changes.  The patient had repeat CT scan of the chest, abdomen pelvis performed recently  and he is here for evaluation and discussion of his scan results.   MEDICAL HISTORY: Past Medical History:  Diagnosis Date   Allergic rhinitis    Allergy    Anxiety    Chronic low back pain    DDD (degenerative disc disease), lumbar    ED (erectile dysfunction)    Finger injury    cut pads off 3rd and 4th finger left hand/due to lawn mower accident   GERD (gastroesophageal reflux disease)    Heart murmur    as child only-    History of kidney stones    HTN (hypertension) 08/20/2016   Hyperlipidemia    Incomplete right bundle branch block    Prostate cancer ([place]) dx 10/02/14   stage T1c   Sigmoid diverticulosis    Small cell lung cancer ([place]) 03/2020   Wears glasses     ALLERGIES:  is allergic to bee venom, elemental sulfur, other, sulfa antibiotics, and namenda [memantine].  MEDICATIONS:  Current Outpatient Medications  Medication Sig Dispense Refill   acetaminophen (TYLENOL) 325 MG tablet Take 2 tablets (650 mg total) by mouth every 6 (six) hours as needed for mild pain (or Fever >/= 101). 30 tablet 0   aspirin EC 81 MG tablet Take 81 mg by mouth daily. Swallow whole.     Budeson-Glycopyrrol-Formoterol (BREZTRI AEROSPHERE) 160-9-4.8 MCG/ACT AERO Inhale 2 puffs into the lungs in the morning and at bedtime. 10.7 g 11   busPIRone (BUSPAR) 10 MG tablet Take 10 mg by mouth 2 (  two) times daily.     famotidine (PEPCID) 20 MG tablet Take 1 tablet (20 mg total) by mouth 2 (two) times daily. 60 tablet 2   methylPREDNISolone (MEDROL) 4 MG tablet Take 6 tabs po day 1, 5 tabs po day 2, 4 tabs po day 3, 3 tabs po day 4, 2 tabs po day 5, 1 tab po day 6 then stop 21 tablet 0   Multiple Vitamin (MULTIVITAMIN) capsule Take 1 capsule by mouth daily.     ondansetron (ZOFRAN-ODT) 8 MG disintegrating tablet Take 1 tablet (8 mg total) by mouth every 8 (eight) hours as needed for nausea or vomiting. 30 tablet 2   pantoprazole (PROTONIX) 40 MG tablet Take 40 mg by mouth every morning.      prochlorperazine (COMPAZINE) 10 MG tablet TAKE 1 TABLET BY MOUTH EVERY 6 HOURS AS NEEDED 30 tablet 2   sodium bicarbonate 650 MG tablet TAKE 1 TABLET BY MOUTH TWICE A DAY 60 tablet 1   traMADol (ULTRAM) 50 MG tablet TAKE 1/2 TABLET BY MOUTH TWICE A DAY AS NEEDED FOR PAIN 60 tablet 2   No current facility-administered medications for this visit.   Facility-Administered Medications Ordered in Other Visits  Medication Dose Route Frequency Provider Last Rate Last Admin   heparin lock flush 100 unit/mL  500 Units Intracatheter Daily PRN Curt Bears, MD       sodium chloride flush (NS) 0.9 % injection 10 mL  10 mL Intracatheter PRN Curt Bears, MD       sodium chloride flush (NS) 0.9 % injection 10 mL  10 mL Intracatheter PRN Curt Bears, MD   10 mL at 07/16/21 1606    SURGICAL HISTORY:  Past Surgical History:  Procedure Laterality Date   BRONCHIAL BIOPSY  04/12/2020   Procedure: BRONCHIAL BIOPSIES;  Surgeon: Marshell Garfinkel, MD;  Location: [facility];  Service: Cardiopulmonary;;   BRONCHIAL BRUSHINGS  04/12/2020   Procedure: BRONCHIAL BRUSHINGS;  Surgeon: Marshell Garfinkel, MD;  Location: [facility];  Service: Cardiopulmonary;;   BRONCHIAL NEEDLE ASPIRATION BIOPSY  04/12/2020   Procedure: BRONCHIAL NEEDLE ASPIRATION BIOPSIES;  Surgeon: Marshell Garfinkel, MD;  Location: [facility];  Service: Cardiopulmonary;;   BRONCHIAL WASHINGS  04/12/2020   Procedure: BRONCHIAL WASHINGS;  Surgeon: Marshell Garfinkel, MD;  Location: [facility];  Service: Cardiopulmonary;;   COLONOSCOPY     COLONOSCOPY W/ POLYPECTOMY  04-19-2012   ENDOBRONCHIAL ULTRASOUND N/A 04/12/2020   Procedure: ENDOBRONCHIAL ULTRASOUND;  Surgeon: Marshell Garfinkel, MD;  Location: [facility];  Service: Cardiopulmonary;  Laterality: N/A;   ESOPHAGOGASTRODUODENOSCOPY  08-05-2010   HEMOSTASIS CONTROL  04/12/2020   Procedure: HEMOSTASIS CONTROL;  Surgeon: Marshell Garfinkel, MD;  Location: [facility];  Service: Cardiopulmonary;;    IR IMAGING GUIDED PORT INSERTION  05/31/2020   POLYPECTOMY     PROSTATE BIOPSY  10/02/14   RADIOACTIVE SEED IMPLANT N/A 01/30/2015   Procedure: RADIOACTIVE SEED IMPLANT;  Surgeon: Rana Snare, MD;  Location: [facility];  Service: Urology;  Laterality: N/A;   UPPER GASTROINTESTINAL ENDOSCOPY  04/03/2021   VIDEO BRONCHOSCOPY N/A 04/12/2020   Procedure: VIDEO BRONCHOSCOPY WITHOUT FLUORO;  Surgeon: Marshell Garfinkel, MD;  Location: [facility];  Service: Cardiopulmonary;  Laterality: N/A;    REVIEW OF SYSTEMS:  Constitutional: positive for fatigue Eyes: negative Ears, nose, mouth, throat, and face: negative Respiratory: positive for dyspnea on exertion Cardiovascular: negative Gastrointestinal: negative Genitourinary:negative Integument/breast: negative Hematologic/lymphatic: negative Musculoskeletal:negative Neurological: negative Behavioral/Psych: negative Endocrine: negative Allergic/Immunologic: negative   PHYSICAL EXAMINATION: General appearance: alert, cooperative, fatigued,  and no distress Head: Normocephalic, without obvious abnormality, atraumatic Neck: no adenopathy, no JVD, supple, symmetrical, trachea midline, and thyroid not enlarged, symmetric, no tenderness/mass/nodules Lymph nodes: Cervical, supraclavicular, and axillary nodes normal. Resp: clear to auscultation bilaterally Back: symmetric, no curvature. ROM normal. No CVA tenderness. Cardio: regular rate and rhythm, S1, S2 normal, no murmur, click, rub or gallop GI: soft, non-tender; bowel sounds normal; no masses,  no organomegaly Extremities: extremities normal, atraumatic, no cyanosis or edema Neurologic: Alert and oriented X 3, normal strength and tone. Normal symmetric reflexes. Normal coordination and gait  ECOG PERFORMANCE STATUS: 1 - Symptomatic but completely ambulatory  Blood pressure 113/78, pulse 89, temperature (!) 95.9 F (35.5 C), temperature source Tympanic, resp. rate 19, height 6'  (1.829 m), weight 145 lb 8 oz (66 kg), SpO2 100 %.  LABORATORY DATA: Lab Results  Component Value Date   WBC 2.0 (L) 09/08/2021   HGB 8.0 (L) 09/08/2021   HCT 22.4 (L) 09/08/2021   MCV 92.2 09/08/2021   PLT 51 (L) 09/08/2021      Chemistry      Component Value Date/Time   NA 140 09/08/2021 1427   K 4.2 09/08/2021 1427   CL 103 09/08/2021 1427   CO2 27 09/08/2021 1427   BUN 25 (H) 09/08/2021 1427   CREATININE 0.92 09/08/2021 1427      Component Value Date/Time   CALCIUM 9.7 09/08/2021 1427   ALKPHOS 105 09/08/2021 1427   AST 25 09/08/2021 1427   ALT 21 09/08/2021 1427   BILITOT 0.4 09/08/2021 1427       RADIOGRAPHIC STUDIES: CT Chest W Contrast  Result Date: 09/14/2021 CLINICAL DATA:  60 year old male with history of small cell lung cancer diagnosed in May 2021. Chemotherapy ongoing. Status post radiation therapy to the left adrenal gland in summer of 2022. Follow-up evaluation. EXAM: CT CHEST, ABDOMEN, AND PELVIS WITH CONTRAST TECHNIQUE: Multidetector CT imaging of the chest, abdomen and pelvis was performed following the standard protocol during bolus administration of intravenous contrast. CONTRAST:  32mL OMNIPAQUE IOHEXOL 350 MG/ML SOLN COMPARISON:  CT of the chest, abdomen and pelvis 08/01/2021. FINDINGS: CT CHEST FINDINGS Cardiovascular: Heart size is normal. There is no significant pericardial fluid, thickening or pericardial calcification. There is aortic atherosclerosis, as well as atherosclerosis of the great vessels of the mediastinum and the coronary arteries, including calcified atherosclerotic plaque in the left anterior descending and right coronary arteries. Right internal jugular single-lumen porta cath with tip terminating in the distal superior vena cava. Mediastinum/Nodes: No pathologically enlarged mediastinal or hilar lymph nodes. Esophagus is unremarkable in appearance. No axillary lymphadenopathy. Lungs/Pleura: Previously treated left upper lobe lesion is  now a thick-walled cavitary area (axial image 58 of series 6) measuring approximately 2.3 x 1.4 cm, similar in appearance to the prior study. Cephalad to the cavitary portion of this lesion there is a more solid-appearing nodule measuring 1.0 x 0.7 cm (axial image 52 of series 6), also similar to the prior examination. Multiple adjacent areas of pleuroparenchymal nodularity are noted in the left upper lobe extending to the apex, in addition to contralateral pleuroparenchymal nodularity in the apex, likely to reflect chronic post infectious or inflammatory scarring. In the paramediastinal aspect of the left upper and lower lobes there is extensive chronic architectural distortion with volume loss and chronic areas of bronchiectasis, most compatible with chronic postradiation mass-like fibrosis, similar to the prior examination. No other definite suspicious appearing pulmonary nodules or masses are noted. No acute consolidative airspace disease. No  pleural effusions. Musculoskeletal: Sclerosis in the superior aspect of the sternum (sagittal image 82 of series 5) is slightly more apparent than the prior examination, likely to represent a treated metastatic lesion. 7 mm sclerotic lesion in T8 vertebral body (sagittal image 93 of series 5), unchanged. CT ABDOMEN PELVIS FINDINGS Hepatobiliary: No suspicious cystic or solid hepatic lesions. No intra or extrahepatic biliary ductal dilatation. Gallbladder is normal in appearance. Pancreas: No pancreatic mass. No pancreatic ductal dilatation. No pancreatic or peripancreatic fluid collections or inflammatory changes. Spleen: Unremarkable. Adrenals/Urinary Tract: 2.3 cm low-attenuation lesion without enhancement in the interpolar region of the right kidney, compatible with a simple cyst. In the interpolar collecting system of the left kidney there is a 3 mm nonobstructive calculus. Bilateral adrenal glands are normal in appearance. Specifically, the previously treated left  adrenal mass is no longer apparent. No hydroureteronephrosis. Urinary bladder is completely decompressed, but otherwise unremarkable in appearance. Stomach/Bowel: Normal appearance of the stomach. No pathologic dilatation of small bowel or colon. A few scattered colonic diverticulae are noted, without surrounding inflammatory changes to suggest an acute diverticulitis at this time. Normal appendix. Vascular/Lymphatic: Aortic atherosclerosis, without evidence of aneurysm or dissection in the abdominal or pelvic vasculature. No lymphadenopathy noted in the abdomen or pelvis. Reproductive: Brachytherapy implants throughout the prostate gland. Other: No significant volume of ascites.  No pneumoperitoneum. Musculoskeletal: 1.0 cm sclerotic lesion in the right proximal femur (axial image 138 of series 2), similar to the prior examination. 1.3 cm sclerotic lesion in the left femoral head (axial image 130 of series 2), similar to the prior examination. Old healed fracture of the anterior aspect of the left second rib. IMPRESSION: 1. Stable post treatment related findings in the thorax, with persistent cavitation of the primary left upper lobe lesion, as well as adjacent small solid component, as detailed above. No new suspicious appearing pulmonary nodules or masses are noted. No mediastinal or hilar lymphadenopathy. 2. Treated left adrenal lesion is no longer evident. 3. Multiple sclerotic lesions scattered throughout the visualized axial and appendicular skeleton, similar to the prior study, likely to represent treated metastatic disease. 4. Aortic atherosclerosis, in addition to two vessel coronary artery disease. Please note that although the presence of coronary artery calcium documents the presence of coronary artery disease, the severity of this disease and any potential stenosis cannot be assessed on this non-gated CT examination. Assessment for potential risk factor modification, dietary therapy or pharmacologic  therapy may be warranted, if clinically indicated. 5. Additional incidental findings, similar to the prior study, as above. Electronically Signed   By: Vinnie Langton M.D.   On: 09/14/2021 08:31   MR Brain W Wo Contrast  Result Date: 09/07/2021 CLINICAL DATA:  60 year old male with extensive stage small cell lung cancer. Status post whole brain radiation in October 2021, with suspicion of a subcentimeter left cerebellar brain metastasis on initial cancer staging. Five new subcentimeter enhancing lesions in June. Restaging. EXAM: MRI HEAD WITHOUT AND WITH CONTRAST TECHNIQUE: Multiplanar, multiecho pulse sequences of the brain and surrounding structures were obtained without and with intravenous contrast. CONTRAST:  70mL MULTIHANCE GADOBENATE DIMEGLUMINE 529 MG/ML IV SOLN COMPARISON:  Brain MRI 05/15/2021 and earlier. FINDINGS: BRAIN New Lesions: None. Larger lesions: None. Stable or Smaller lesions: Enhancing left lateral occipital lobe lesion, now 5 mm (previously 9 mm) on series 11, image 58. No edema. Punctate right external capsule enhancing lesion on image 91. No edema. Only punctate hemosiderin identified now at the site of the treated right temporal operculum  lesion (series 6, image 47). And similar finding at the left inferior frontal gyrus lesion (series 6, image 63). No other abnormal enhancement identified. Small right parietal lobe developmental venous anomaly (normal variant) redemonstrated on series 12, image 117). No dural thickening. Other Brain findings: No restricted diffusion to suggest acute infarction. No midline shift, mass effect,ventriculomegaly, extra-axial collection or acute intracranial hemorrhage. Cervicomedullary junction and pituitary are within normal limits. Patchy and scattered cerebral white matter T2 and FLAIR hyperintensity is stable, some of which is typical of chronic small vessel disease (posterior left corona radiata). No cortical encephalomalacia identified. Vascular:  Major intracranial vascular flow voids are stable. The major dural venous sinuses appear to be enhancing and patent. Skull and upper cervical spine: Visualized bone marrow signal is within normal limits. Negative visible cervical spine. Sinuses/Orbits: Stable, negative. Other: Bilateral mastoid effusions are stable. Grossly normal visible internal auditory structures. Less retained secretions in the nasopharynx which otherwise appears negative. Negative visible scalp and face soft tissues. IMPRESSION: 1. Regression of disease. Only one or two out of the 5 treated metastases in June are enhancing now, the largest now 5 mm in the left occipital lobe. No edema or mass effect. See series 11. 2. No new metastatic disease or intracranial abnormality identified. Electronically Signed   By: Genevie Ann M.D.   On: 09/07/2021 09:22   CT Abdomen Pelvis W Contrast  Result Date: 09/14/2021 CLINICAL DATA:  60 year old male with history of small cell lung cancer diagnosed in May 2021. Chemotherapy ongoing. Status post radiation therapy to the left adrenal gland in summer of 2022. Follow-up evaluation. EXAM: CT CHEST, ABDOMEN, AND PELVIS WITH CONTRAST TECHNIQUE: Multidetector CT imaging of the chest, abdomen and pelvis was performed following the standard protocol during bolus administration of intravenous contrast. CONTRAST:  64mL OMNIPAQUE IOHEXOL 350 MG/ML SOLN COMPARISON:  CT of the chest, abdomen and pelvis 08/01/2021. FINDINGS: CT CHEST FINDINGS Cardiovascular: Heart size is normal. There is no significant pericardial fluid, thickening or pericardial calcification. There is aortic atherosclerosis, as well as atherosclerosis of the great vessels of the mediastinum and the coronary arteries, including calcified atherosclerotic plaque in the left anterior descending and right coronary arteries. Right internal jugular single-lumen porta cath with tip terminating in the distal superior vena cava. Mediastinum/Nodes: No  pathologically enlarged mediastinal or hilar lymph nodes. Esophagus is unremarkable in appearance. No axillary lymphadenopathy. Lungs/Pleura: Previously treated left upper lobe lesion is now a thick-walled cavitary area (axial image 58 of series 6) measuring approximately 2.3 x 1.4 cm, similar in appearance to the prior study. Cephalad to the cavitary portion of this lesion there is a more solid-appearing nodule measuring 1.0 x 0.7 cm (axial image 52 of series 6), also similar to the prior examination. Multiple adjacent areas of pleuroparenchymal nodularity are noted in the left upper lobe extending to the apex, in addition to contralateral pleuroparenchymal nodularity in the apex, likely to reflect chronic post infectious or inflammatory scarring. In the paramediastinal aspect of the left upper and lower lobes there is extensive chronic architectural distortion with volume loss and chronic areas of bronchiectasis, most compatible with chronic postradiation mass-like fibrosis, similar to the prior examination. No other definite suspicious appearing pulmonary nodules or masses are noted. No acute consolidative airspace disease. No pleural effusions. Musculoskeletal: Sclerosis in the superior aspect of the sternum (sagittal image 82 of series 5) is slightly more apparent than the prior examination, likely to represent a treated metastatic lesion. 7 mm sclerotic lesion in T8 vertebral body (sagittal  image 93 of series 5), unchanged. CT ABDOMEN PELVIS FINDINGS Hepatobiliary: No suspicious cystic or solid hepatic lesions. No intra or extrahepatic biliary ductal dilatation. Gallbladder is normal in appearance. Pancreas: No pancreatic mass. No pancreatic ductal dilatation. No pancreatic or peripancreatic fluid collections or inflammatory changes. Spleen: Unremarkable. Adrenals/Urinary Tract: 2.3 cm low-attenuation lesion without enhancement in the interpolar region of the right kidney, compatible with a simple cyst. In the  interpolar collecting system of the left kidney there is a 3 mm nonobstructive calculus. Bilateral adrenal glands are normal in appearance. Specifically, the previously treated left adrenal mass is no longer apparent. No hydroureteronephrosis. Urinary bladder is completely decompressed, but otherwise unremarkable in appearance. Stomach/Bowel: Normal appearance of the stomach. No pathologic dilatation of small bowel or colon. A few scattered colonic diverticulae are noted, without surrounding inflammatory changes to suggest an acute diverticulitis at this time. Normal appendix. Vascular/Lymphatic: Aortic atherosclerosis, without evidence of aneurysm or dissection in the abdominal or pelvic vasculature. No lymphadenopathy noted in the abdomen or pelvis. Reproductive: Brachytherapy implants throughout the prostate gland. Other: No significant volume of ascites.  No pneumoperitoneum. Musculoskeletal: 1.0 cm sclerotic lesion in the right proximal femur (axial image 138 of series 2), similar to the prior examination. 1.3 cm sclerotic lesion in the left femoral head (axial image 130 of series 2), similar to the prior examination. Old healed fracture of the anterior aspect of the left second rib. IMPRESSION: 1. Stable post treatment related findings in the thorax, with persistent cavitation of the primary left upper lobe lesion, as well as adjacent small solid component, as detailed above. No new suspicious appearing pulmonary nodules or masses are noted. No mediastinal or hilar lymphadenopathy. 2. Treated left adrenal lesion is no longer evident. 3. Multiple sclerotic lesions scattered throughout the visualized axial and appendicular skeleton, similar to the prior study, likely to represent treated metastatic disease. 4. Aortic atherosclerosis, in addition to two vessel coronary artery disease. Please note that although the presence of coronary artery calcium documents the presence of coronary artery disease, the severity  of this disease and any potential stenosis cannot be assessed on this non-gated CT examination. Assessment for potential risk factor modification, dietary therapy or pharmacologic therapy may be warranted, if clinically indicated. 5. Additional incidental findings, similar to the prior study, as above. Electronically Signed   By: Vinnie Langton M.D.   On: 09/14/2021 08:31     ASSESSMENT AND PLAN: This is a very pleasant 60 years old white male recently diagnosed with extensive stage small cell lung cancer presented with left upper lobe lung mass in addition to left hilar and mediastinal lymphadenopathy as well as solitary brain metastasis diagnosed in May 2021. He underwent palliative radiotherapy to the left hilar and mediastinal lymphadenopathy in addition to the radiotherapy to the brain metastasis.  He continues to have residual odynophagia and dysphagia from the palliative radiotherapy. He is currently undergoing systemic chemotherapy with carboplatin for AUC of 5 on day 1, etoposide 100 mg/M2 on days 1, 2 and 3 with Cosela infusion before chemotherapy.  He is status post 14 cycles of treatment  Starting from cycle #5 the patient is on treatment with maintenance Imfinzi every 4 weeks. 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  His treatment was discontinued. He started treatment with systemic chemotherapy again with carboplatin for AUC of 5 on day 1 and 2 etoposide 100 Mg/M2 on days, 1, 2 and 3 every 3  weeks with Cosela status post 4 cycles. The patient continues to tolerate this treatment well with no concerning adverse effects except for mild fatigue from the chemotherapy-induced anemia. He had repeat CT scan of the chest, abdomen pelvis performed recently.  I personally and independently reviewed the scans and discussed the results with the patient today. Has a scan showed no concerning findings for disease  progression. I recommended for him to proceed with 2 more cycles of systemic chemotherapy with the same regimen carboplatin and etoposide. For the chemotherapy-induced anemia, will continue to monitor and consider The patient for PRBCs transfusion on as-needed basis. For the hyponatremia secondary to SIADH, his serum sodium has significant improvement since starting systemic chemotherapy.  We will continue to monitor for now. For smoking cessation I strongly encouraged the patient to quit smoking. I will see him back for follow-up visit in 3 weeks for evaluation with repeat CT scan of the chest, abdomen and pelvis for restaging of his disease. The patient was advised to call immediately if he has any concerning symptoms in the interval.  The patient voices understanding of current disease status and treatment options and is in agreement with the current care plan.  All questions were answered. The patient knows to call the clinic with any problems, questions or concerns. We can certainly see the patient much sooner if necessary.   Disclaimer: This note was dictated with voice recognition software. Similar sounding words can inadvertently be transcribed and may not be corrected upon review.

## 2021-09-15 NOTE — Patient Instructions (Signed)
[facility]COLOG[facility]  Discharge Instructions: Thank you for choosing [facility] to provide your oncology and hematology care.   If you have a lab appointment with the [facility], please go directly to the [facility] and check in at the registration area.   Wear comfortable clothing and clothing appropriate for easy access to any Portacath or PICC line.   We strive to give you quality time with your provider. You may need to reschedule your appointment if you arrive late (15 or more minutes).  Arriving late affects you and other patients whose appointments are after yours.  Also, if you miss three or more appointments without notifying the office, you may be dismissed from the clinic at the provider's discretion.      For prescription refill requests, have your pharmacy contact our office and allow 72 hours for refills to be completed.    Today you received the following chemotherapy and/or immunotherapy agents Carboplatin/Etoposide      To help prevent nausea and vomiting after your treatment, we encourage you to take your nausea medication as directed.  BELOW ARE SYMPTOMS THAT SHOULD BE REPORTED IMMEDIATELY: *FEVER GREATER THAN 100.4 F (38 C) OR HIGHER *CHILLS OR SWEATING *NAUSEA AND VOMITING THAT IS NOT CONTROLLED WITH YOUR NAUSEA MEDICATION *UNUSUAL SHORTNESS OF BREATH *UNUSUAL BRUISING OR BLEEDING *URINARY PROBLEMS (pain or burning when urinating, or frequent urination) *BOWEL PROBLEMS (unusual diarrhea, constipation, pain near the anus) TENDERNESS IN MOUTH AND THROAT WITH OR WITHOUT PRESENCE OF ULCERS (sore throat, sores in mouth, or a toothache) UNUSUAL RASH, SWELLING OR PAIN  UNUSUAL VAGINAL DISCHARGE OR ITCHING   Items with * indicate a potential emergency and should be followed up as soon as possible or go to the Emergency Department if any problems should occur.  Please show the CHEMOTHERAPY ALERT CARD or IMMUNOTHERAPY ALERT CARD at  check-in to the Emergency Department and triage nurse.  Should you have questions after your visit or need to cancel or reschedule your appointment, please contact [facility]  Dept: [phone]  and follow the prompts.  Office hours are 8:00 a.m. to 4:30 p.m. Monday - Friday. Please note that voicemails left after 4:00 p.m. may not be returned until the following business day.  We are closed weekends and major holidays. You have access to a nurse at all times for urgent questions. Please call the main number to the clinic Dept: [phone] and follow the prompts.   For any non-urgent questions, you may also contact your provider using MyChart. We now offer e-Visits for anyone 49 and older to request care online for non-urgent symptoms. For details visit [email or web address].   Also download the MyChart app! Go to the app store, search "MyChart", open the app, select [facility], and log in with your MyChart username and password.  Due to Covid, a mask is required upon entering the hospital/clinic. If you do not have a mask, one will be given to you upon arrival. For doctor visits, patients may have 1 support person aged 40 or older with them. For treatment visits, patients cannot have anyone with them due to current Covid guidelines and our immunocompromised population.

## 2021-09-15 NOTE — Progress Notes (Signed)
Okay to proceed with treatment today with anc of 1.3 per Dr. Inda Merlin.

## 2021-09-16 ENCOUNTER — Inpatient Hospital Stay: Payer: 59

## 2021-09-16 VITALS — BP 127/76 | HR 94 | Temp 98.1°F | Resp 18 | Wt 150.0 lb

## 2021-09-16 DIAGNOSIS — C3412 Malignant neoplasm of upper lobe, left bronchus or lung: Secondary | ICD-10-CM

## 2021-09-16 DIAGNOSIS — Z5111 Encounter for antineoplastic chemotherapy: Secondary | ICD-10-CM | POA: Diagnosis not present

## 2021-09-16 MED ORDER — HEPARIN SOD (PORK) LOCK FLUSH 100 UNIT/ML IV SOLN
500.0000 [IU] | Freq: Once | INTRAVENOUS | Status: AC | PRN
  Administered 2021-09-16: 500 [IU]

## 2021-09-16 MED ORDER — SODIUM CHLORIDE 0.9 % IV SOLN
10.0000 mg | Freq: Once | INTRAVENOUS | Status: AC
  Administered 2021-09-16: 10 mg via INTRAVENOUS
  Filled 2021-09-16: qty 10

## 2021-09-16 MED ORDER — SODIUM CHLORIDE 0.9% FLUSH
10.0000 mL | INTRAVENOUS | Status: DC | PRN
  Administered 2021-09-16: 10 mL

## 2021-09-16 MED ORDER — TRILACICLIB DIHYDROCHLORIDE INJECTION 300 MG
240.0000 mg/m2 | Freq: Once | INTRAVENOUS | Status: AC
  Administered 2021-09-16: 435 mg via INTRAVENOUS
  Filled 2021-09-16: qty 29

## 2021-09-16 MED ORDER — SODIUM CHLORIDE 0.9 % IV SOLN
100.0000 mg/m2 | Freq: Once | INTRAVENOUS | Status: AC
  Administered 2021-09-16: 180 mg via INTRAVENOUS
  Filled 2021-09-16: qty 9

## 2021-09-16 MED ORDER — SODIUM CHLORIDE 0.9 % IV SOLN: Freq: Once | INTRAVENOUS | Status: AC

## 2021-09-16 MED FILL — Dexamethasone Sodium Phosphate Inj 100 MG/10ML: INTRAMUSCULAR | Qty: 1 | Status: AC

## 2021-09-16 NOTE — Patient Instructions (Signed)
[facility] ONCOLOGY  Discharge Instructions: Thank you for choosing [facility] to provide your oncology and hematology care.   If you have a lab appointment with the [facility], please go directly to the [facility] and check in at the registration area.   Wear comfortable clothing and clothing appropriate for easy access to any Portacath or PICC line.   We strive to give you quality time with your provider. You may need to reschedule your appointment if you arrive late (15 or more minutes).  Arriving late affects you and other patients whose appointments are after yours.  Also, if you miss three or more appointments without notifying the office, you may be dismissed from the clinic at the provider's discretion.      For prescription refill requests, have your pharmacy contact our office and allow 72 hours for refills to be completed.    Today you received the following chemotherapy and/or immunotherapy agents etoposide, cosela      To help prevent nausea and vomiting after your treatment, we encourage you to take your nausea medication as directed.  BELOW ARE SYMPTOMS THAT SHOULD BE REPORTED IMMEDIATELY: *FEVER GREATER THAN 100.4 F (38 C) OR HIGHER *CHILLS OR SWEATING *NAUSEA AND VOMITING THAT IS NOT CONTROLLED WITH YOUR NAUSEA MEDICATION *UNUSUAL SHORTNESS OF BREATH *UNUSUAL BRUISING OR BLEEDING *URINARY PROBLEMS (pain or burning when urinating, or frequent urination) *BOWEL PROBLEMS (unusual diarrhea, constipation, pain near the anus) TENDERNESS IN MOUTH AND THROAT WITH OR WITHOUT PRESENCE OF ULCERS (sore throat, sores in mouth, or a toothache) UNUSUAL RASH, SWELLING OR PAIN  UNUSUAL VAGINAL DISCHARGE OR ITCHING   Items with * indicate a potential emergency and should be followed up as soon as possible or go to the Emergency Department if any problems should occur.  Please show the CHEMOTHERAPY ALERT CARD or IMMUNOTHERAPY ALERT CARD at  check-in to the Emergency Department and triage nurse.  Should you have questions after your visit or need to cancel or reschedule your appointment, please contact [facility]  Dept: [phone]  and follow the prompts.  Office hours are 8:00 a.m. to 4:30 p.m. Monday - Friday. Please note that voicemails left after 4:00 p.m. may not be returned until the following business day.  We are closed weekends and major holidays. You have access to a nurse at all times for urgent questions. Please call the main number to the clinic Dept: [phone] and follow the prompts.   For any non-urgent questions, you may also contact your provider using MyChart. We now offer e-Visits for anyone 76 and older to request care online for non-urgent symptoms. For details visit [email or web address].   Also download the MyChart app! Go to the app store, search "MyChart", open the app, select [facility], and log in with your MyChart username and password.  Due to Covid, a mask is required upon entering the hospital/clinic. If you do not have a mask, one will be given to you upon arrival. For doctor visits, patients may have 1 support person aged 35 or older with them. For treatment visits, patients cannot have anyone with them due to current Covid guidelines and our immunocompromised population.

## 2021-09-17 ENCOUNTER — Other Ambulatory Visit: Payer: Self-pay

## 2021-09-17 ENCOUNTER — Ambulatory Visit (INDEPENDENT_AMBULATORY_CARE_PROVIDER_SITE_OTHER): Payer: 59 | Admitting: Internal Medicine

## 2021-09-17 ENCOUNTER — Encounter: Payer: Self-pay | Admitting: Internal Medicine

## 2021-09-17 ENCOUNTER — Inpatient Hospital Stay: Payer: 59

## 2021-09-17 VITALS — BP 128/70 | HR 78 | Ht 72.0 in | Wt 154.0 lb

## 2021-09-17 VITALS — BP 123/75 | HR 83 | Temp 98.2°F | Resp 17

## 2021-09-17 DIAGNOSIS — I10 Essential (primary) hypertension: Secondary | ICD-10-CM

## 2021-09-17 DIAGNOSIS — Z5111 Encounter for antineoplastic chemotherapy: Secondary | ICD-10-CM | POA: Diagnosis not present

## 2021-09-17 DIAGNOSIS — C3412 Malignant neoplasm of upper lobe, left bronchus or lung: Secondary | ICD-10-CM

## 2021-09-17 DIAGNOSIS — Z720 Tobacco use: Secondary | ICD-10-CM

## 2021-09-17 DIAGNOSIS — E7800 Pure hypercholesterolemia, unspecified: Secondary | ICD-10-CM | POA: Diagnosis not present

## 2021-09-17 DIAGNOSIS — R7302 Impaired glucose tolerance (oral): Secondary | ICD-10-CM | POA: Diagnosis not present

## 2021-09-17 LAB — POCT GLYCOSYLATED HEMOGLOBIN (HGB A1C): Hemoglobin A1C: 5.5 % (ref 4.0–5.6)

## 2021-09-17 MED ORDER — SODIUM CHLORIDE 0.9 % IV SOLN: Freq: Once | INTRAVENOUS | Status: AC

## 2021-09-17 MED ORDER — HEPARIN SOD (PORK) LOCK FLUSH 100 UNIT/ML IV SOLN
500.0000 [IU] | Freq: Once | INTRAVENOUS | Status: AC | PRN
  Administered 2021-09-17: 500 [IU]

## 2021-09-17 MED ORDER — SODIUM CHLORIDE 0.9 % IV SOLN
100.0000 mg/m2 | Freq: Once | INTRAVENOUS | Status: AC
  Administered 2021-09-17: 180 mg via INTRAVENOUS
  Filled 2021-09-17: qty 9

## 2021-09-17 MED ORDER — LOVASTATIN 20 MG PO TABS: 20.0000 mg | ORAL_TABLET | Freq: Every day | ORAL | 3 refills | Status: DC

## 2021-09-17 MED ORDER — SODIUM CHLORIDE 0.9% FLUSH
10.0000 mL | INTRAVENOUS | Status: DC | PRN
  Administered 2021-09-17: 10 mL

## 2021-09-17 MED ORDER — SODIUM CHLORIDE 0.9 % IV SOLN
10.0000 mg | Freq: Once | INTRAVENOUS | Status: AC
  Administered 2021-09-17: 10 mg via INTRAVENOUS
  Filled 2021-09-17: qty 10

## 2021-09-17 MED ORDER — TRILACICLIB DIHYDROCHLORIDE INJECTION 300 MG
240.0000 mg/m2 | Freq: Once | INTRAVENOUS | Status: AC
  Administered 2021-09-17: 435 mg via INTRAVENOUS
  Filled 2021-09-17: qty 29

## 2021-09-17 NOTE — Assessment & Plan Note (Signed)
Pt counseled to quit, not ready

## 2021-09-17 NOTE — Patient Instructions (Signed)
Your A1c was normal today at 5.5.  Ok to restart the lovastatin 20 mg per day  Please continue all other medications as before, and refills have been done if requested.  Please have the pharmacy call with any other refills you may need.  Please continue your efforts at being more active, low cholesterol diet, and weight control.  Please keep your appointments with your specialists as you may have planned  Please make an Appointment to return in 6 months, or sooner if needed

## 2021-09-17 NOTE — Assessment & Plan Note (Signed)
BP Readings from Last 3 Encounters:  09/17/21 128/70  09/17/21 123/75  09/16/21 127/76   Stable, pt to continue medical treatment  - wt control, low salt diet

## 2021-09-17 NOTE — Assessment & Plan Note (Signed)
Lab Results  Component Value Date   [place] 61 05/20/2021   Uncontrolled off statin, pt to restart current statin lovaastatin

## 2021-09-17 NOTE — Progress Notes (Signed)
Patient ID: Richard Santos, male   DOB: May 18, 1961, 60 y.o.   MRN: [id number]        Chief Complaint: follow up htn, hyperglycemia hld       HPI:  Richard Santos is a 60 y.o. male here overall doing ok.  Pt is S/p 2units PRBC last wk due to chemo. Denies worsening reflux, abd pain, dysphagia, n/v, bowel change or blood.  Pt denies chest pain, wheezing, orthopnea, PND, increased LE swelling, palpitations, dizziness or syncope, but has had incrsaed sob/doe over last 6 mo.   Pt denies polydipsia, polyuria, finishing steroid tx tomorrow.      No recurrent low Na since July.  Still smoking occasinoal, has not quit completely.  Asks to restart his statin, trying to follow lower chol diet as well.   Not eligible for covid booster,   Pt denies fever, wt loss, night sweats, loss of appetite, or other constitutional symptoms, in fact has gained wt recently   Wt Readings from Last 3 Encounters:  09/17/21 154 lb (69.9 kg)  09/16/21 150 lb (68 kg)  09/15/21 145 lb 8 oz (66 kg)   BP Readings from Last 3 Encounters:  09/17/21 128/70  09/17/21 123/75  09/16/21 127/76         Past Medical History:  Diagnosis Date   Allergic rhinitis    Allergy    Anxiety    Chronic low back pain    DDD (degenerative disc disease), lumbar    ED (erectile dysfunction)    Finger injury    cut pads off 3rd and 4th finger left hand/due to lawn mower accident   GERD (gastroesophageal reflux disease)    Heart murmur    as child only-    History of kidney stones    HTN (hypertension) 08/20/2016   Hyperlipidemia    Incomplete right bundle branch block    Prostate cancer ([facility]) dx 10/02/14   stage T1c   Sigmoid diverticulosis    Small cell lung cancer ([facility]) 03/2020   Wears glasses    Past Surgical History:  Procedure Laterality Date   BRONCHIAL BIOPSY  04/12/2020   Procedure: BRONCHIAL BIOPSIES;  Surgeon: Marshell Garfinkel, MD;  Location: [facility];  Service: Cardiopulmonary;;   BRONCHIAL BRUSHINGS  04/12/2020    Procedure: BRONCHIAL BRUSHINGS;  Surgeon: Marshell Garfinkel, MD;  Location: [facility];  Service: Cardiopulmonary;;   BRONCHIAL NEEDLE ASPIRATION BIOPSY  04/12/2020   Procedure: BRONCHIAL NEEDLE ASPIRATION BIOPSIES;  Surgeon: Marshell Garfinkel, MD;  Location: [facility];  Service: Cardiopulmonary;;   BRONCHIAL WASHINGS  04/12/2020   Procedure: BRONCHIAL WASHINGS;  Surgeon: Marshell Garfinkel, MD;  Location: [facility];  Service: Cardiopulmonary;;   COLONOSCOPY     COLONOSCOPY W/ POLYPECTOMY  04-19-2012   ENDOBRONCHIAL ULTRASOUND N/A 04/12/2020   Procedure: ENDOBRONCHIAL ULTRASOUND;  Surgeon: Marshell Garfinkel, MD;  Location: [facility];  Service: Cardiopulmonary;  Laterality: N/A;   ESOPHAGOGASTRODUODENOSCOPY  08-05-2010   HEMOSTASIS CONTROL  04/12/2020   Procedure: HEMOSTASIS CONTROL;  Surgeon: Marshell Garfinkel, MD;  Location: [facility];  Service: Cardiopulmonary;;   IR IMAGING GUIDED PORT INSERTION  05/31/2020   POLYPECTOMY     PROSTATE BIOPSY  10/02/14   RADIOACTIVE SEED IMPLANT N/A 01/30/2015   Procedure: RADIOACTIVE SEED IMPLANT;  Surgeon: Rana Snare, MD;  Location: [facility];  Service: Urology;  Laterality: N/A;   UPPER GASTROINTESTINAL ENDOSCOPY  04/03/2021   VIDEO BRONCHOSCOPY N/A 04/12/2020   Procedure: VIDEO BRONCHOSCOPY WITHOUT FLUORO;  Surgeon: Marshell Garfinkel, MD;  Location: [facility];  Service: Cardiopulmonary;  Laterality: N/A;    reports that he has been smoking cigarettes. He has a 12.50 pack-year smoking history. He has never used smokeless tobacco. He reports current alcohol use of about 49.0 standard drinks per week. He reports that he does not use drugs. family history includes Breast cancer in his sister; Heart disease in his father, mother, and another family member; Prostate cancer in his father. Allergies  Allergen Reactions   Bee Venom Swelling   Elemental Sulfur Other (See Comments)    Acute renal failure   Other Other (See Comments)    Chemo  medicine- patient not sure about name - swelling & rashes   Sulfa Antibiotics    Namenda [Memantine] Nausea Only    Nausea and fatigue   Current Outpatient Medications on File Prior to Visit  Medication Sig Dispense Refill   aspirin EC 81 MG tablet Take 81 mg by mouth daily. Swallow whole.     Budeson-Glycopyrrol-Formoterol (BREZTRI AEROSPHERE) 160-9-4.8 MCG/ACT AERO Inhale 2 puffs into the lungs in the morning and at bedtime. 10.7 g 11   busPIRone (BUSPAR) 10 MG tablet Take 10 mg by mouth 2 (two) times daily.     famotidine (PEPCID) 20 MG tablet Take 1 tablet (20 mg total) by mouth 2 (two) times daily. 60 tablet 2   methylPREDNISolone (MEDROL) 4 MG tablet Take 6 tabs po day 1, 5 tabs po day 2, 4 tabs po day 3, 3 tabs po day 4, 2 tabs po day 5, 1 tab po day 6 then stop 21 tablet 0   Multiple Vitamin (MULTIVITAMIN) capsule Take 1 capsule by mouth daily.     ondansetron (ZOFRAN-ODT) 8 MG disintegrating tablet Take 1 tablet (8 mg total) by mouth every 8 (eight) hours as needed for nausea or vomiting. 30 tablet 2   pantoprazole (PROTONIX) 40 MG tablet Take 40 mg by mouth every morning.     prochlorperazine (COMPAZINE) 10 MG tablet TAKE 1 TABLET BY MOUTH EVERY 6 HOURS AS NEEDED 30 tablet 2   sodium bicarbonate 650 MG tablet TAKE 1 TABLET BY MOUTH TWICE A DAY 60 tablet 1   traMADol (ULTRAM) 50 MG tablet TAKE 1/2 TABLET BY MOUTH TWICE A DAY AS NEEDED FOR PAIN 60 tablet 2   Current Facility-Administered Medications on File Prior to Visit  Medication Dose Route Frequency Provider Last Rate Last Admin   heparin lock flush 100 unit/mL  500 Units Intracatheter Daily PRN Curt Bears, MD       sodium chloride flush (NS) 0.9 % injection 10 mL  10 mL Intracatheter PRN Curt Bears, MD       sodium chloride flush (NS) 0.9 % injection 10 mL  10 mL Intracatheter PRN Curt Bears, MD   10 mL at 07/16/21 1606        ROS:  All others reviewed and negative.  Objective        PE:  BP 128/70 (BP  Location: Right Arm, Patient Position: Sitting, Cuff Size: Large)   Pulse 78   Ht 6' (1.829 m)   Wt 154 lb (69.9 kg)   SpO2 99%   BMI 20.89 kg/m                 Constitutional: Pt appears in NAD               HENT: Head: NCAT.                Right Ear:  External ear normal.                 Left Ear: External ear normal.                Eyes: . Pupils are equal, round, and reactive to light. Conjunctivae and EOM are normal               Nose: without d/c or deformity               Neck: Neck supple. Gross normal ROM               Cardiovascular: Normal rate and regular rhythm.                 Pulmonary/Chest: Effort normal and breath sounds without rales or wheezing.                Abd:  Soft, NT, ND, + BS, no organomegaly               Neurological: Pt is alert. At baseline orientation, motor grossly intact               Skin: Skin is warm. No rashes, no other new lesions, LE edema - none               Psychiatric: Pt behavior is normal without agitation   Micro: none  Cardiac tracings I have personally interpreted today:  none  Pertinent Radiological findings (summarize): none   Lab Results  Component Value Date   WBC 2.2 (L) 09/15/2021   HGB 9.9 (L) 09/15/2021   HCT 29.0 (L) 09/15/2021   PLT 166 09/15/2021   GLUCOSE 99 09/15/2021   CHOL 123 05/20/2021   TRIG 47 05/20/2021   HDL 53 05/20/2021   LDLDIRECT 120.0 12/12/2019   LDLCALC 61 05/20/2021   ALT 23 09/15/2021   AST 28 09/15/2021   NA 138 09/15/2021   K 4.0 09/15/2021   CL 102 09/15/2021   CREATININE 0.74 09/15/2021   BUN 17 09/15/2021   CO2 29 09/15/2021   TSH 1.083 06/17/2021   PSA 0.27 08/10/2018   INR 1.0 04/11/2020   HGBA1C 5.5 09/17/2021   Hemoglobin A1C 4.0 - 5.6 % 5.5  5.4 R, CM  5.6 R, CM  5.6 R, CM  5.4 R, CM     Assessment/Plan:  Richard Santos is a 60 y.o. White or Caucasian [1] male with  has a past medical history of Allergic rhinitis, Allergy, Anxiety, Chronic low back pain, DDD (degenerative  disc disease), lumbar, ED (erectile dysfunction), Finger injury, GERD (gastroesophageal reflux disease), Heart murmur, History of kidney stones, HTN (hypertension) (08/20/2016), Hyperlipidemia, Incomplete right bundle branch block, Prostate cancer ([facility]) (dx 10/02/14), Sigmoid diverticulosis, Small cell lung cancer ([facility]) (03/2020), and Wears glasses.  Impaired glucose tolerance Lab Results  Component Value Date   HGBA1C 5.5 09/17/2021   Stable, pt to continue current medical treatment  - diet   HTN (hypertension) BP Readings from Last 3 Encounters:  09/17/21 128/70  09/17/21 123/75  09/16/21 127/76   Stable, pt to continue medical treatment  - wt control, low salt diet   Tobacco abuse Pt counseled to quit, not ready  Hyperlipidemia Lab Results  Component Value Date   [place] 61 05/20/2021   Uncontrolled off statin, pt to restart current statin lovaastatin  Followup: No follow-ups on file.  Cathlean Cower, MD 09/17/2021 10:14 PM [facility]  Internal Medicine

## 2021-09-17 NOTE — Assessment & Plan Note (Signed)
Lab Results  Component Value Date   HGBA1C 5.5 09/17/2021   Stable, pt to continue current medical treatment  - diet

## 2021-09-17 NOTE — Patient Instructions (Signed)
[facility] ONCOLOGY  Discharge Instructions: Thank you for choosing [facility] to provide your oncology and hematology care.   If you have a lab appointment with the [facility], please go directly to the [facility] and check in at the registration area.   Wear comfortable clothing and clothing appropriate for easy access to any Portacath or PICC line.   We strive to give you quality time with your provider. You may need to reschedule your appointment if you arrive late (15 or more minutes).  Arriving late affects you and other patients whose appointments are after yours.  Also, if you miss three or more appointments without notifying the office, you may be dismissed from the clinic at the provider's discretion.      For prescription refill requests, have your pharmacy contact our office and allow 72 hours for refills to be completed.    Today you received the following chemotherapy and/or immunotherapy agents: Cosela & Etoposide   To help prevent nausea and vomiting after your treatment, we encourage you to take your nausea medication as directed.  BELOW ARE SYMPTOMS THAT SHOULD BE REPORTED IMMEDIATELY: *FEVER GREATER THAN 100.4 F (38 C) OR HIGHER *CHILLS OR SWEATING *NAUSEA AND VOMITING THAT IS NOT CONTROLLED WITH YOUR NAUSEA MEDICATION *UNUSUAL SHORTNESS OF BREATH *UNUSUAL BRUISING OR BLEEDING *URINARY PROBLEMS (pain or burning when urinating, or frequent urination) *BOWEL PROBLEMS (unusual diarrhea, constipation, pain near the anus) TENDERNESS IN MOUTH AND THROAT WITH OR WITHOUT PRESENCE OF ULCERS (sore throat, sores in mouth, or a toothache) UNUSUAL RASH, SWELLING OR PAIN  UNUSUAL VAGINAL DISCHARGE OR ITCHING   Items with * indicate a potential emergency and should be followed up as soon as possible or go to the Emergency Department if any problems should occur.  Please show the CHEMOTHERAPY ALERT CARD or IMMUNOTHERAPY ALERT CARD at  check-in to the Emergency Department and triage nurse.  Should you have questions after your visit or need to cancel or reschedule your appointment, please contact [facility]  Dept: [phone]  and follow the prompts.  Office hours are 8:00 a.m. to 4:30 p.m. Monday - Friday. Please note that voicemails left after 4:00 p.m. may not be returned until the following business day.  We are closed weekends and major holidays. You have access to a nurse at all times for urgent questions. Please call the main number to the clinic Dept: [phone] and follow the prompts.   For any non-urgent questions, you may also contact your provider using MyChart. We now offer e-Visits for anyone 72 and older to request care online for non-urgent symptoms. For details visit [email or web address].   Also download the MyChart app! Go to the app store, search "MyChart", open the app, select [facility], and log in with your MyChart username and password.  Due to Covid, a mask is required upon entering the hospital/clinic. If you do not have a mask, one will be given to you upon arrival. For doctor visits, patients may have 1 support person aged 78 or older with them. For treatment visits, patients cannot have anyone with them due to current Covid guidelines and our immunocompromised population.

## 2021-09-19 ENCOUNTER — Other Ambulatory Visit: Payer: Self-pay | Admitting: Physician Assistant

## 2021-09-19 DIAGNOSIS — C3412 Malignant neoplasm of upper lobe, left bronchus or lung: Secondary | ICD-10-CM

## 2021-09-22 ENCOUNTER — Other Ambulatory Visit: Payer: Self-pay

## 2021-09-22 ENCOUNTER — Inpatient Hospital Stay: Payer: 59

## 2021-09-22 DIAGNOSIS — Z95828 Presence of other vascular implants and grafts: Secondary | ICD-10-CM

## 2021-09-22 DIAGNOSIS — C3412 Malignant neoplasm of upper lobe, left bronchus or lung: Secondary | ICD-10-CM

## 2021-09-22 DIAGNOSIS — Z5111 Encounter for antineoplastic chemotherapy: Secondary | ICD-10-CM | POA: Diagnosis not present

## 2021-09-22 LAB — CBC WITH DIFFERENTIAL (CANCER CENTER ONLY)
Abs Immature Granulocytes: 0.05 10*3/uL (ref 0.00–0.07)
Basophils Absolute: 0 10*3/uL (ref 0.0–0.1)
Basophils Relative: 0 %
Eosinophils Absolute: 0 10*3/uL (ref 0.0–0.5)
Eosinophils Relative: 1 %
HCT: 28.5 % — ABNORMAL LOW (ref 39.0–52.0)
Hemoglobin: 9.6 g/dL — ABNORMAL LOW (ref 13.0–17.0)
Immature Granulocytes: 2 %
Lymphocytes Relative: 12 %
Lymphs Abs: 0.4 10*3/uL — ABNORMAL LOW (ref 0.7–4.0)
MCH: 32.2 pg (ref 26.0–34.0)
MCHC: 33.7 g/dL (ref 30.0–36.0)
MCV: 95.6 fL (ref 80.0–100.0)
Monocytes Absolute: 0.2 10*3/uL (ref 0.1–1.0)
Monocytes Relative: 6 %
Neutro Abs: 2.7 10*3/uL (ref 1.7–7.7)
Neutrophils Relative %: 79 %
Platelet Count: 145 10*3/uL — ABNORMAL LOW (ref 150–400)
RBC: 2.98 MIL/uL — ABNORMAL LOW (ref 4.22–5.81)
RDW: 17.3 % — ABNORMAL HIGH (ref 11.5–15.5)
WBC Count: 3.4 10*3/uL — ABNORMAL LOW (ref 4.0–10.5)
nRBC: 0 % (ref 0.0–0.2)

## 2021-09-22 LAB — CMP (CANCER CENTER ONLY)
ALT: 17 U/L (ref 0–44)
AST: 23 U/L (ref 15–41)
Albumin: 3.5 g/dL (ref 3.5–5.0)
Alkaline Phosphatase: 87 U/L (ref 38–126)
Anion gap: 8 (ref 5–15)
BUN: 22 mg/dL — ABNORMAL HIGH (ref 6–20)
CO2: 28 mmol/L (ref 22–32)
Calcium: 9.4 mg/dL (ref 8.9–10.3)
Chloride: 102 mmol/L (ref 98–111)
Creatinine: 0.79 mg/dL (ref 0.61–1.24)
GFR, Estimated: >60 mL/min (ref >60)
Glucose, Bld: 131 mg/dL — ABNORMAL HIGH (ref 70–99)
Potassium: 4.3 mmol/L (ref 3.5–5.1)
Sodium: 138 mmol/L (ref 135–145)
Total Bilirubin: 0.6 mg/dL (ref 0.3–1.2)
Total Protein: 7.2 g/dL (ref 6.5–8.1)

## 2021-09-22 LAB — SAMPLE TO BLOOD BANK

## 2021-09-22 MED ORDER — SODIUM CHLORIDE 0.9% FLUSH
10.0000 mL | Freq: Once | INTRAVENOUS | Status: AC
  Administered 2021-09-22: 10 mL

## 2021-09-22 MED ORDER — HEPARIN SOD (PORK) LOCK FLUSH 100 UNIT/ML IV SOLN
500.0000 [IU] | Freq: Once | INTRAVENOUS | Status: AC
  Administered 2021-09-22: 500 [IU]

## 2021-09-29 ENCOUNTER — Encounter: Payer: Self-pay | Admitting: Internal Medicine

## 2021-09-29 ENCOUNTER — Other Ambulatory Visit: Payer: Self-pay

## 2021-09-29 ENCOUNTER — Inpatient Hospital Stay: Payer: 59 | Attending: Internal Medicine

## 2021-09-29 DIAGNOSIS — F17210 Nicotine dependence, cigarettes, uncomplicated: Secondary | ICD-10-CM | POA: Diagnosis not present

## 2021-09-29 DIAGNOSIS — C7931 Secondary malignant neoplasm of brain: Secondary | ICD-10-CM | POA: Diagnosis present

## 2021-09-29 DIAGNOSIS — C3412 Malignant neoplasm of upper lobe, left bronchus or lung: Secondary | ICD-10-CM | POA: Diagnosis present

## 2021-09-29 DIAGNOSIS — Z5111 Encounter for antineoplastic chemotherapy: Secondary | ICD-10-CM | POA: Insufficient documentation

## 2021-09-29 DIAGNOSIS — T451X5A Adverse effect of antineoplastic and immunosuppressive drugs, initial encounter: Secondary | ICD-10-CM | POA: Insufficient documentation

## 2021-09-29 DIAGNOSIS — Z7982 Long term (current) use of aspirin: Secondary | ICD-10-CM | POA: Diagnosis not present

## 2021-09-29 DIAGNOSIS — Z79899 Other long term (current) drug therapy: Secondary | ICD-10-CM | POA: Diagnosis not present

## 2021-09-29 DIAGNOSIS — Z95828 Presence of other vascular implants and grafts: Secondary | ICD-10-CM

## 2021-09-29 DIAGNOSIS — D6481 Anemia due to antineoplastic chemotherapy: Secondary | ICD-10-CM | POA: Insufficient documentation

## 2021-09-29 DIAGNOSIS — E222 Syndrome of inappropriate secretion of antidiuretic hormone: Secondary | ICD-10-CM | POA: Diagnosis not present

## 2021-09-29 LAB — CBC WITH DIFFERENTIAL (CANCER CENTER ONLY)
Abs Immature Granulocytes: 0.01 10*3/uL (ref 0.00–0.07)
Basophils Absolute: 0 10*3/uL (ref 0.0–0.1)
Basophils Relative: 0 %
Eosinophils Absolute: 0 10*3/uL (ref 0.0–0.5)
Eosinophils Relative: 1 %
HCT: 24.4 % — ABNORMAL LOW (ref 39.0–52.0)
Hemoglobin: 8.6 g/dL — ABNORMAL LOW (ref 13.0–17.0)
Immature Granulocytes: 0 %
Lymphocytes Relative: 16 %
Lymphs Abs: 0.5 10*3/uL — ABNORMAL LOW (ref 0.7–4.0)
MCH: 32.7 pg (ref 26.0–34.0)
MCHC: 35.2 g/dL (ref 30.0–36.0)
MCV: 92.8 fL (ref 80.0–100.0)
Monocytes Absolute: 0.5 10*3/uL (ref 0.1–1.0)
Monocytes Relative: 19 %
Neutro Abs: 1.8 10*3/uL (ref 1.7–7.7)
Neutrophils Relative %: 64 %
Platelet Count: 53 10*3/uL — ABNORMAL LOW (ref 150–400)
RBC: 2.63 MIL/uL — ABNORMAL LOW (ref 4.22–5.81)
RDW: 17.1 % — ABNORMAL HIGH (ref 11.5–15.5)
WBC Count: 2.8 10*3/uL — ABNORMAL LOW (ref 4.0–10.5)
nRBC: 0 % (ref 0.0–0.2)

## 2021-09-29 LAB — CMP (CANCER CENTER ONLY)
ALT: 23 U/L (ref 0–44)
AST: 27 U/L (ref 15–41)
Albumin: 3.7 g/dL (ref 3.5–5.0)
Alkaline Phosphatase: 106 U/L (ref 38–126)
Anion gap: 8 (ref 5–15)
BUN: 17 mg/dL (ref 6–20)
CO2: 28 mmol/L (ref 22–32)
Calcium: 9.3 mg/dL (ref 8.9–10.3)
Chloride: 102 mmol/L (ref 98–111)
Creatinine: 0.8 mg/dL (ref 0.61–1.24)
GFR, Estimated: >60 mL/min (ref >60)
Glucose, Bld: 136 mg/dL — ABNORMAL HIGH (ref 70–99)
Potassium: 4 mmol/L (ref 3.5–5.1)
Sodium: 138 mmol/L (ref 135–145)
Total Bilirubin: 0.4 mg/dL (ref 0.3–1.2)
Total Protein: 7.3 g/dL (ref 6.5–8.1)

## 2021-09-29 MED ORDER — HEPARIN SOD (PORK) LOCK FLUSH 100 UNIT/ML IV SOLN
500.0000 [IU] | Freq: Once | INTRAVENOUS | Status: AC
  Administered 2021-09-29: 500 [IU]

## 2021-09-29 MED ORDER — SODIUM CHLORIDE 0.9% FLUSH
10.0000 mL | Freq: Once | INTRAVENOUS | Status: AC
  Administered 2021-09-29: 10 mL

## 2021-09-30 ENCOUNTER — Other Ambulatory Visit: Payer: Self-pay | Admitting: Internal Medicine

## 2021-09-30 DIAGNOSIS — C3412 Malignant neoplasm of upper lobe, left bronchus or lung: Secondary | ICD-10-CM

## 2021-09-30 LAB — SAMPLE TO BLOOD BANK

## 2021-10-03 ENCOUNTER — Other Ambulatory Visit: Payer: Self-pay | Admitting: Physician Assistant

## 2021-10-03 DIAGNOSIS — R110 Nausea: Secondary | ICD-10-CM

## 2021-10-03 DIAGNOSIS — C3412 Malignant neoplasm of upper lobe, left bronchus or lung: Secondary | ICD-10-CM

## 2021-10-03 DIAGNOSIS — C7931 Secondary malignant neoplasm of brain: Secondary | ICD-10-CM

## 2021-10-06 ENCOUNTER — Inpatient Hospital Stay: Payer: 59

## 2021-10-06 ENCOUNTER — Inpatient Hospital Stay (HOSPITAL_BASED_OUTPATIENT_CLINIC_OR_DEPARTMENT_OTHER): Payer: 59 | Admitting: Internal Medicine

## 2021-10-06 ENCOUNTER — Encounter: Payer: Self-pay | Admitting: Internal Medicine

## 2021-10-06 ENCOUNTER — Other Ambulatory Visit: Payer: Self-pay

## 2021-10-06 ENCOUNTER — Other Ambulatory Visit: Payer: Self-pay | Admitting: Physician Assistant

## 2021-10-06 VITALS — BP 109/75 | HR 95 | Temp 97.3°F | Resp 19 | Ht 72.0 in | Wt 145.9 lb

## 2021-10-06 DIAGNOSIS — C3412 Malignant neoplasm of upper lobe, left bronchus or lung: Secondary | ICD-10-CM

## 2021-10-06 DIAGNOSIS — D6481 Anemia due to antineoplastic chemotherapy: Secondary | ICD-10-CM

## 2021-10-06 DIAGNOSIS — T451X5A Adverse effect of antineoplastic and immunosuppressive drugs, initial encounter: Secondary | ICD-10-CM | POA: Diagnosis not present

## 2021-10-06 DIAGNOSIS — Z95828 Presence of other vascular implants and grafts: Secondary | ICD-10-CM

## 2021-10-06 DIAGNOSIS — Z5111 Encounter for antineoplastic chemotherapy: Secondary | ICD-10-CM

## 2021-10-06 DIAGNOSIS — C3490 Malignant neoplasm of unspecified part of unspecified bronchus or lung: Secondary | ICD-10-CM

## 2021-10-06 LAB — CBC WITH DIFFERENTIAL (CANCER CENTER ONLY)
Abs Immature Granulocytes: 0.01 10*3/uL (ref 0.00–0.07)
Basophils Absolute: 0 10*3/uL (ref 0.0–0.1)
Basophils Relative: 1 %
Eosinophils Absolute: 0.1 10*3/uL (ref 0.0–0.5)
Eosinophils Relative: 3 %
HCT: 24 % — ABNORMAL LOW (ref 39.0–52.0)
Hemoglobin: 8.2 g/dL — ABNORMAL LOW (ref 13.0–17.0)
Immature Granulocytes: 0 %
Lymphocytes Relative: 17 %
Lymphs Abs: 0.4 10*3/uL — ABNORMAL LOW (ref 0.7–4.0)
MCH: 33.2 pg (ref 26.0–34.0)
MCHC: 34.2 g/dL (ref 30.0–36.0)
MCV: 97.2 fL (ref 80.0–100.0)
Monocytes Absolute: 0.6 10*3/uL (ref 0.1–1.0)
Monocytes Relative: 24 %
Neutro Abs: 1.4 10*3/uL — ABNORMAL LOW (ref 1.7–7.7)
Neutrophils Relative %: 55 %
Platelet Count: 197 10*3/uL (ref 150–400)
RBC: 2.47 MIL/uL — ABNORMAL LOW (ref 4.22–5.81)
RDW: 18.8 % — ABNORMAL HIGH (ref 11.5–15.5)
WBC Count: 2.5 10*3/uL — ABNORMAL LOW (ref 4.0–10.5)
nRBC: 0 % (ref 0.0–0.2)

## 2021-10-06 LAB — CMP (CANCER CENTER ONLY)
ALT: 20 U/L (ref 0–44)
AST: 27 U/L (ref 15–41)
Albumin: 3.8 g/dL (ref 3.5–5.0)
Alkaline Phosphatase: 94 U/L (ref 38–126)
Anion gap: 8 (ref 5–15)
BUN: 14 mg/dL (ref 6–20)
CO2: 27 mmol/L (ref 22–32)
Calcium: 9.2 mg/dL (ref 8.9–10.3)
Chloride: 103 mmol/L (ref 98–111)
Creatinine: 0.77 mg/dL (ref 0.61–1.24)
GFR, Estimated: >60 mL/min (ref >60)
Glucose, Bld: 98 mg/dL (ref 70–99)
Potassium: 4.2 mmol/L (ref 3.5–5.1)
Sodium: 138 mmol/L (ref 135–145)
Total Bilirubin: 0.3 mg/dL (ref 0.3–1.2)
Total Protein: 7.2 g/dL (ref 6.5–8.1)

## 2021-10-06 LAB — SAMPLE TO BLOOD BANK

## 2021-10-06 MED ORDER — TRILACICLIB DIHYDROCHLORIDE INJECTION 300 MG
240.0000 mg/m2 | Freq: Once | INTRAVENOUS | Status: AC
  Administered 2021-10-06: 435 mg via INTRAVENOUS
  Filled 2021-10-06: qty 29

## 2021-10-06 MED ORDER — SODIUM CHLORIDE 0.9 % IV SOLN
100.0000 mg/m2 | Freq: Once | INTRAVENOUS | Status: AC
  Administered 2021-10-06: 180 mg via INTRAVENOUS
  Filled 2021-10-06: qty 9

## 2021-10-06 MED ORDER — PALONOSETRON HCL INJECTION 0.25 MG/5ML
0.2500 mg | Freq: Once | INTRAVENOUS | Status: AC
  Administered 2021-10-06: 0.25 mg via INTRAVENOUS
  Filled 2021-10-06: qty 5

## 2021-10-06 MED ORDER — SODIUM CHLORIDE 0.9 % IV SOLN: Freq: Once | INTRAVENOUS | Status: AC

## 2021-10-06 MED ORDER — FAMOTIDINE 20 MG IN NS 100 ML IVPB
20.0000 mg | Freq: Once | INTRAVENOUS | Status: AC
  Administered 2021-10-06: 20 mg via INTRAVENOUS
  Filled 2021-10-06: qty 100

## 2021-10-06 MED ORDER — SODIUM CHLORIDE 0.9 % IV SOLN
10.0000 mg | Freq: Once | INTRAVENOUS | Status: AC
  Administered 2021-10-06: 10 mg via INTRAVENOUS
  Filled 2021-10-06: qty 10

## 2021-10-06 MED ORDER — SODIUM CHLORIDE 0.9% FLUSH
10.0000 mL | Freq: Once | INTRAVENOUS | Status: AC
  Administered 2021-10-06: 10 mL

## 2021-10-06 MED ORDER — HEPARIN SOD (PORK) LOCK FLUSH 100 UNIT/ML IV SOLN
500.0000 [IU] | Freq: Once | INTRAVENOUS | Status: AC | PRN
  Administered 2021-10-06: 500 [IU]

## 2021-10-06 MED ORDER — SODIUM CHLORIDE 0.9% FLUSH
10.0000 mL | INTRAVENOUS | Status: DC | PRN
  Administered 2021-10-06: 10 mL

## 2021-10-06 MED ORDER — SODIUM CHLORIDE 0.9 % IV SOLN
590.0000 mg | Freq: Once | INTRAVENOUS | Status: AC
  Administered 2021-10-06: 590 mg via INTRAVENOUS
  Filled 2021-10-06: qty 59

## 2021-10-06 MED ORDER — SODIUM CHLORIDE 0.9 % IV SOLN
150.0000 mg | Freq: Once | INTRAVENOUS | Status: AC
  Administered 2021-10-06: 150 mg via INTRAVENOUS
  Filled 2021-10-06: qty 150

## 2021-10-06 MED ORDER — DIPHENHYDRAMINE HCL 25 MG PO CAPS
50.0000 mg | ORAL_CAPSULE | Freq: Once | ORAL | Status: AC
  Administered 2021-10-06: 50 mg via ORAL
  Filled 2021-10-06: qty 2

## 2021-10-06 NOTE — Patient Instructions (Signed)
Steps to Quit Smoking Smoking tobacco is the leading cause of preventable death. It can affect almost every organ in the body. Smoking puts you and people around you at risk for many serious, long-lasting (chronic) diseases. Quitting smoking can be hard, but it is one of the best things that you can do for your health. It is never too late to quit. How do I get ready to quit? When you decide to quit smoking, make a plan to help you succeed. Before you quit: Pick a date to quit. Set a date within the next 2 weeks to give you time to prepare. Write down the reasons why you are quitting. Keep this list in places where you will see it often. Tell your family, friends, and co-workers that you are quitting. Their support is important. Talk with your doctor about the choices that may help you quit. Find out if your health insurance will pay for these treatments. Know the people, places, things, and activities that make you want to smoke (triggers). Avoid them. What first steps can I take to quit smoking? Throw away all cigarettes at home, at work, and in your car. Throw away the things that you use when you smoke, such as ashtrays and lighters. Clean your car. Make sure to empty the ashtray. Clean your home, including curtains and carpets. What can I do to help me quit smoking? Talk with your doctor about taking medicines and seeing a counselor at the same time. You are more likely to succeed when you do both. If you are pregnant or breastfeeding, talk with your doctor about counseling or other ways to quit smoking. Do not take medicine to help you quit smoking unless your doctor tells you to do so. To quit smoking: Quit right away Quit smoking totally, instead of slowly cutting back on how much you smoke over a period of time. Go to counseling. You are more likely to quit if you go to counseling sessions regularly. Take medicine You may take medicines to help you quit. Some medicines need a  prescription, and some you can buy over-the-counter. Some medicines may contain a drug called nicotine to replace the nicotine in cigarettes. Medicines may: Help you to stop having the desire to smoke (cravings). Help to stop the problems that come when you stop smoking (withdrawal symptoms). Your doctor may ask you to use: Nicotine patches, gum, or lozenges. Nicotine inhalers or sprays. Non-nicotine medicine that is taken by mouth. Find resources Find resources and other ways to help you quit smoking and remain smoke-free after you quit. These resources are most helpful when you use them often. They include: Online chats with a counselor. Phone quitlines. Printed self-help materials. Support groups or group counseling. Text messaging programs. Mobile phone apps. Use apps on your mobile phone or tablet that can help you stick to your quit plan. There are many free apps for mobile phones and tablets as well as websites. Examples include Quit Guide from the CDC and smokefree.gov  What things can I do to make it easier to quit?  Talk to your family and friends. Ask them to support and encourage you. Call a phone quitline (1-800-QUIT-NOW), reach out to support groups, or work with a counselor. Ask people who smoke to not smoke around you. Avoid places that make you want to smoke, such as: Bars. Parties. Smoke-break areas at work. Spend time with people who do not smoke. Lower the stress in your life. Stress can make you want to   smoke. Try these things to help your stress: Getting regular exercise. Doing deep-breathing exercises. Doing yoga. Meditating. Doing a body scan. To do this, close your eyes, focus on one area of your body at a time from head to toe. Notice which parts of your body are tense. Try to relax the muscles in those areas. How will I feel when I quit smoking? Day 1 to 3 weeks Within the first 24 hours, you may start to have some problems that come from quitting tobacco.  These problems are very bad 2-3 days after you quit, but they do not often last for more than 2-3 weeks. You may get these symptoms: Mood swings. Feeling restless, nervous, angry, or annoyed. Trouble concentrating. Dizziness. Strong desire for high-sugar foods and nicotine. Weight gain. Trouble pooping (constipation). Feeling like you may vomit (nausea). Coughing or a sore throat. Changes in how the medicines that you take for other issues work in your body. Depression. Trouble sleeping (insomnia). Week 3 and afterward After the first 2-3 weeks of quitting, you may start to notice more positive results, such as: Better sense of smell and taste. Less coughing and sore throat. Slower heart rate. Lower blood pressure. Clearer skin. Better breathing. Fewer sick days. Quitting smoking can be hard. Do not give up if you fail the first time. Some people need to try a few times before they succeed. Do your best to stick to your quit plan, and talk with your doctor if you have any questions or concerns. Summary Smoking tobacco is the leading cause of preventable death. Quitting smoking can be hard, but it is one of the best things that you can do for your health. When you decide to quit smoking, make a plan to help you succeed. Quit smoking right away, not slowly over a period of time. When you start quitting, seek help from your doctor, family, or friends. This information is not intended to replace advice given to you by your health care provider. Make sure you discuss any questions you have with your health care provider. Document Revised: 07/18/2021 Document Reviewed: 01/28/2019 Elsevier Patient Education  2022 [employer]  

## 2021-10-06 NOTE — Patient Instructions (Signed)
[facility]  Discharge Instructions: Thank you for choosing [facility] to provide your oncology and hematology care.   If you have a lab appointment with the [facility], please go directly to the [facility] and check in at the registration area.   Wear comfortable clothing and clothing appropriate for easy access to any Portacath or PICC line.   We strive to give you quality time with your provider. You may need to reschedule your appointment if you arrive late (15 or more minutes).  Arriving late affects you and other patients whose appointments are after yours.  Also, if you miss three or more appointments without notifying the office, you may be dismissed from the clinic at the provider's discretion.      For prescription refill requests, have your pharmacy contact our office and allow 72 hours for refills to be completed.    Today you received the following chemotherapy and/or immunotherapy agents Carboplatin and etoposide.      To help prevent nausea and vomiting after your treatment, we encourage you to take your nausea medication as directed.  BELOW ARE SYMPTOMS THAT SHOULD BE REPORTED IMMEDIATELY: *FEVER GREATER THAN 100.4 F (38 C) OR HIGHER *CHILLS OR SWEATING *NAUSEA AND VOMITING THAT IS NOT CONTROLLED WITH YOUR NAUSEA MEDICATION *UNUSUAL SHORTNESS OF BREATH *UNUSUAL BRUISING OR BLEEDING *URINARY PROBLEMS (pain or burning when urinating, or frequent urination) *BOWEL PROBLEMS (unusual diarrhea, constipation, pain near the anus) TENDERNESS IN MOUTH AND THROAT WITH OR WITHOUT PRESENCE OF ULCERS (sore throat, sores in mouth, or a toothache) UNUSUAL RASH, SWELLING OR PAIN  UNUSUAL VAGINAL DISCHARGE OR ITCHING   Items with * indicate a potential emergency and should be followed up as soon as possible or go to the Emergency Department if any problems should occur.  Please show the CHEMOTHERAPY ALERT CARD or IMMUNOTHERAPY ALERT CARD  at check-in to the Emergency Department and triage nurse.  Should you have questions after your visit or need to cancel or reschedule your appointment, please contact [facility]  Dept: [phone]  and follow the prompts.  Office hours are 8:00 a.m. to 4:30 p.m. Monday - Friday. Please note that voicemails left after 4:00 p.m. may not be returned until the following business day.  We are closed weekends and major holidays. You have access to a nurse at all times for urgent questions. Please call the main number to the clinic Dept: [phone] and follow the prompts.   For any non-urgent questions, you may also contact your provider using MyChart. We now offer e-Visits for anyone 60 and older to request care online for non-urgent symptoms. For details visit [email or web address].   Also download the MyChart app! Go to the app store, search "MyChart", open the app, select [facility], and log in with your MyChart username and password.  Due to Covid, a mask is required upon entering the hospital/clinic. If you do not have a mask, one will be given to you upon arrival. For doctor visits, patients may have 1 support person aged 18 or older with them. For treatment visits, patients cannot have anyone with them due to current Covid guidelines and our immunocompromised population.

## 2021-10-06 NOTE — Progress Notes (Signed)
[facility] [phone]   [phone]  OFFICE PROGRESS NOTE  [name], MD [place] [id number]  DIAGNOSIS: Extensive stage (T2b, N2, M1c) small cell lung cancer presented with left upper lobe pulmonary nodule in addition to left hilar mass with mediastinal invasion and solitary brain metastasis diagnosed in May 2021  PRIOR THERAPY:  1) Palliative radiation to the left hilar and mediastinal lymphadenopathy under the care of Dr. Lisbeth Renshaw. 2) Cranial irradiation from 08/26/20-09/06/20 under the care of Dr. Lisbeth Renshaw 3) Palliative systemic chemotherapy with carboplatin for AUC of 5 on day 1, etoposide 100 mg/M2 on days 1, 2 and 3 as well as Imfinzi 1500 mg IV every 3 weeks with the chemotherapy.  First dose of chemotherapy May 06, 2020.  The patient will receive treatment with Cosela on the days of his chemotherapy.  Status post 14 cycles.  Starting from cycle #5 he started maintenance immunotherapy with Imfinzi 1500 mg IV every 4 weeks. Discontinued due to disease progression. Last dose 05/29/21.    CURRENT THERAPY: Carboplatin for an AUC of 5 and etoposide 100 mg/M2 on days 1, 2, and 3 IV every 3 weeks Neulasta or Cosela.  First dose expected on 06/24/2021.  Status post 5 cycles.  INTERVAL HISTORY: JOHNATON SONNEBORN 60 y.o. male returns to the clinic today for follow-up visit accompanied by his wife.  The patient is feeling fine today with no concerning complaints except for fatigue.  He also has mild itching on the right side of the abdomen.  He denied having any chest pain, shortness of breath, cough or hemoptysis.  He denied having any fever or chills.  He has no nausea, vomiting, diarrhea or constipation.  He has no headache or visual changes.  He lost few pounds since his last visit.  He continues to tolerate his treatment with chemotherapy fairly well.  The patient is here today for evaluation before starting cycle #6 of his  treatment.  MEDICAL HISTORY: Past Medical History:  Diagnosis Date   Allergic rhinitis    Allergy    Anxiety    Chronic low back pain    DDD (degenerative disc disease), lumbar    ED (erectile dysfunction)    Finger injury    cut pads off 3rd and 4th finger left hand/due to lawn mower accident   GERD (gastroesophageal reflux disease)    Heart murmur    as child only-    History of kidney stones    HTN (hypertension) 08/20/2016   Hyperlipidemia    Incomplete right bundle branch block    Prostate cancer ([facility]) dx 10/02/14   stage T1c   Sigmoid diverticulosis    Small cell lung cancer ([facility]) 03/2020   Wears glasses     ALLERGIES:  is allergic to bee venom, elemental sulfur, other, sulfa antibiotics, and namenda [memantine].  MEDICATIONS:  Current Outpatient Medications  Medication Sig Dispense Refill   aspirin EC 81 MG tablet Take 81 mg by mouth daily. Swallow whole.     Budeson-Glycopyrrol-Formoterol (BREZTRI AEROSPHERE) 160-9-4.8 MCG/ACT AERO Inhale 2 puffs into the lungs in the morning and at bedtime. 10.7 g 11   busPIRone (BUSPAR) 10 MG tablet Take 10 mg by mouth 2 (two) times daily.     famotidine (PEPCID) 20 MG tablet Take 1 tablet (20 mg total) by mouth 2 (two) times daily. 60 tablet 2   lovastatin (MEVACOR) 20 MG tablet Take 1 tablet (20 mg total)  by mouth at bedtime. 90 tablet 3   methylPREDNISolone (MEDROL) 4 MG tablet Take 6 tabs po day 1, 5 tabs po day 2, 4 tabs po day 3, 3 tabs po day 4, 2 tabs po day 5, 1 tab po day 6 then stop 21 tablet 0   Multiple Vitamin (MULTIVITAMIN) capsule Take 1 capsule by mouth daily.     ondansetron (ZOFRAN-ODT) 8 MG disintegrating tablet TAKE 1 TABLET BY MOUTH EVERY 8 HOURS AS NEEDED FOR NAUSEA OR VOMITING. 18 tablet 4   pantoprazole (PROTONIX) 40 MG tablet Take 40 mg by mouth every morning.     prochlorperazine (COMPAZINE) 10 MG tablet TAKE 1 TABLET BY MOUTH EVERY 6 HOURS AS NEEDED 30 tablet 2   sodium bicarbonate 650 MG tablet TAKE 1  TABLET BY MOUTH TWICE A DAY 60 tablet 1   traMADol (ULTRAM) 50 MG tablet TAKE 1/2 TABLET BY MOUTH TWICE A DAY AS NEEDED FOR PAIN 60 tablet 2   No current facility-administered medications for this visit.   Facility-Administered Medications Ordered in Other Visits  Medication Dose Route Frequency Provider Last Rate Last Admin   heparin lock flush 100 unit/mL  500 Units Intracatheter Daily PRN Curt Bears, MD       sodium chloride flush (NS) 0.9 % injection 10 mL  10 mL Intracatheter PRN Curt Bears, MD       sodium chloride flush (NS) 0.9 % injection 10 mL  10 mL Intracatheter PRN Curt Bears, MD   10 mL at 07/16/21 1606    SURGICAL HISTORY:  Past Surgical History:  Procedure Laterality Date   BRONCHIAL BIOPSY  04/12/2020   Procedure: BRONCHIAL BIOPSIES;  Surgeon: Marshell Garfinkel, MD;  Location: [facility];  Service: Cardiopulmonary;;   BRONCHIAL BRUSHINGS  04/12/2020   Procedure: BRONCHIAL BRUSHINGS;  Surgeon: Marshell Garfinkel, MD;  Location: [facility];  Service: Cardiopulmonary;;   BRONCHIAL NEEDLE ASPIRATION BIOPSY  04/12/2020   Procedure: BRONCHIAL NEEDLE ASPIRATION BIOPSIES;  Surgeon: Marshell Garfinkel, MD;  Location: [facility];  Service: Cardiopulmonary;;   BRONCHIAL WASHINGS  04/12/2020   Procedure: BRONCHIAL WASHINGS;  Surgeon: Marshell Garfinkel, MD;  Location: [facility];  Service: Cardiopulmonary;;   COLONOSCOPY     COLONOSCOPY W/ POLYPECTOMY  04-19-2012   ENDOBRONCHIAL ULTRASOUND N/A 04/12/2020   Procedure: ENDOBRONCHIAL ULTRASOUND;  Surgeon: Marshell Garfinkel, MD;  Location: [facility];  Service: Cardiopulmonary;  Laterality: N/A;   ESOPHAGOGASTRODUODENOSCOPY  08-05-2010   HEMOSTASIS CONTROL  04/12/2020   Procedure: HEMOSTASIS CONTROL;  Surgeon: Marshell Garfinkel, MD;  Location: [facility];  Service: Cardiopulmonary;;   IR IMAGING GUIDED PORT INSERTION  05/31/2020   POLYPECTOMY     PROSTATE BIOPSY  10/02/14   RADIOACTIVE SEED IMPLANT N/A 01/30/2015   Procedure:  RADIOACTIVE SEED IMPLANT;  Surgeon: Rana Snare, MD;  Location[facility];  Service: Urology;  Laterality: N/A;   UPPER GASTROINTESTINAL ENDOSCOPY  04/03/2021   VIDEO BRONCHOSCOPY N/A 04/12/2020   Procedure: VIDEO BRONCHOSCOPY WITHOUT FLUORO;  Surgeon: Marshell Garfinkel, MD;  Location: [facility];  Service: Cardiopulmonary;  Laterality: N/A;    REVIEW OF SYSTEMS:  A comprehensive review of systems was negative except for: Constitutional: positive for fatigue   PHYSICAL EXAMINATION: General appearance: alert, cooperative, fatigued, and no distress Head: Normocephalic, without obvious abnormality, atraumatic Neck: no adenopathy, no JVD, supple, symmetrical, trachea midline, and thyroid not enlarged, symmetric, no tenderness/mass/nodules Lymph nodes: Cervical, supraclavicular, and axillary nodes normal. Resp: clear to auscultation bilaterally Back: symmetric, no curvature. ROM normal. No CVA tenderness. Cardio:  regular rate and rhythm, S1, S2 normal, no murmur, click, rub or gallop GI: soft, non-tender; bowel sounds normal; no masses,  no organomegaly Extremities: extremities normal, atraumatic, no cyanosis or edema  ECOG PERFORMANCE STATUS: 1 - Symptomatic but completely ambulatory  Blood pressure 109/75, pulse 95, temperature (!) 97.3 F (36.3 C), temperature source Tympanic, resp. rate 19, height 6' (1.829 m), weight 145 lb 14.4 oz (66.2 kg), SpO2 100 %.  LABORATORY DATA: Lab Results  Component Value Date   WBC 2.8 (L) 09/29/2021   HGB 8.6 (L) 09/29/2021   HCT 24.4 (L) 09/29/2021   MCV 92.8 09/29/2021   PLT 53 (L) 09/29/2021      Chemistry      Component Value Date/Time   NA 138 09/29/2021 1221   K 4.0 09/29/2021 1221   CL 102 09/29/2021 1221   CO2 28 09/29/2021 1221   BUN 17 09/29/2021 1221   CREATININE 0.80 09/29/2021 1221      Component Value Date/Time   CALCIUM 9.3 09/29/2021 1221   ALKPHOS 106 09/29/2021 1221   AST 27 09/29/2021 1221   ALT 23  09/29/2021 1221   BILITOT 0.4 09/29/2021 1221       RADIOGRAPHIC STUDIES: CT Chest W Contrast  Result Date: 09/14/2021 CLINICAL DATA:  60 year old male with history of small cell lung cancer diagnosed in May 2021. Chemotherapy ongoing. Status post radiation therapy to the left adrenal gland in summer of 2022. Follow-up evaluation. EXAM: CT CHEST, ABDOMEN, AND PELVIS WITH CONTRAST TECHNIQUE: Multidetector CT imaging of the chest, abdomen and pelvis was performed following the standard protocol during bolus administration of intravenous contrast. CONTRAST:  69mL OMNIPAQUE IOHEXOL 350 MG/ML SOLN COMPARISON:  CT of the chest, abdomen and pelvis 08/01/2021. FINDINGS: CT CHEST FINDINGS Cardiovascular: Heart size is normal. There is no significant pericardial fluid, thickening or pericardial calcification. There is aortic atherosclerosis, as well as atherosclerosis of the great vessels of the mediastinum and the coronary arteries, including calcified atherosclerotic plaque in the left anterior descending and right coronary arteries. Right internal jugular single-lumen porta cath with tip terminating in the distal superior vena cava. Mediastinum/Nodes: No pathologically enlarged mediastinal or hilar lymph nodes. Esophagus is unremarkable in appearance. No axillary lymphadenopathy. Lungs/Pleura: Previously treated left upper lobe lesion is now a thick-walled cavitary area (axial image 58 of series 6) measuring approximately 2.3 x 1.4 cm, similar in appearance to the prior study. Cephalad to the cavitary portion of this lesion there is a more solid-appearing nodule measuring 1.0 x 0.7 cm (axial image 52 of series 6), also similar to the prior examination. Multiple adjacent areas of pleuroparenchymal nodularity are noted in the left upper lobe extending to the apex, in addition to contralateral pleuroparenchymal nodularity in the apex, likely to reflect chronic post infectious or inflammatory scarring. In the  paramediastinal aspect of the left upper and lower lobes there is extensive chronic architectural distortion with volume loss and chronic areas of bronchiectasis, most compatible with chronic postradiation mass-like fibrosis, similar to the prior examination. No other definite suspicious appearing pulmonary nodules or masses are noted. No acute consolidative airspace disease. No pleural effusions. Musculoskeletal: Sclerosis in the superior aspect of the sternum (sagittal image 82 of series 5) is slightly more apparent than the prior examination, likely to represent a treated metastatic lesion. 7 mm sclerotic lesion in T8 vertebral body (sagittal image 93 of series 5), unchanged. CT ABDOMEN PELVIS FINDINGS Hepatobiliary: No suspicious cystic or solid hepatic lesions. No intra or extrahepatic biliary ductal dilatation.  Gallbladder is normal in appearance. Pancreas: No pancreatic mass. No pancreatic ductal dilatation. No pancreatic or peripancreatic fluid collections or inflammatory changes. Spleen: Unremarkable. Adrenals/Urinary Tract: 2.3 cm low-attenuation lesion without enhancement in the interpolar region of the right kidney, compatible with a simple cyst. In the interpolar collecting system of the left kidney there is a 3 mm nonobstructive calculus. Bilateral adrenal glands are normal in appearance. Specifically, the previously treated left adrenal mass is no longer apparent. No hydroureteronephrosis. Urinary bladder is completely decompressed, but otherwise unremarkable in appearance. Stomach/Bowel: Normal appearance of the stomach. No pathologic dilatation of small bowel or colon. A few scattered colonic diverticulae are noted, without surrounding inflammatory changes to suggest an acute diverticulitis at this time. Normal appendix. Vascular/Lymphatic: Aortic atherosclerosis, without evidence of aneurysm or dissection in the abdominal or pelvic vasculature. No lymphadenopathy noted in the abdomen or pelvis.  Reproductive: Brachytherapy implants throughout the prostate gland. Other: No significant volume of ascites.  No pneumoperitoneum. Musculoskeletal: 1.0 cm sclerotic lesion in the right proximal femur (axial image 138 of series 2), similar to the prior examination. 1.3 cm sclerotic lesion in the left femoral head (axial image 130 of series 2), similar to the prior examination. Old healed fracture of the anterior aspect of the left second rib. IMPRESSION: 1. Stable post treatment related findings in the thorax, with persistent cavitation of the primary left upper lobe lesion, as well as adjacent small solid component, as detailed above. No new suspicious appearing pulmonary nodules or masses are noted. No mediastinal or hilar lymphadenopathy. 2. Treated left adrenal lesion is no longer evident. 3. Multiple sclerotic lesions scattered throughout the visualized axial and appendicular skeleton, similar to the prior study, likely to represent treated metastatic disease. 4. Aortic atherosclerosis, in addition to two vessel coronary artery disease. Please note that although the presence of coronary artery calcium documents the presence of coronary artery disease, the severity of this disease and any potential stenosis cannot be assessed on this non-gated CT examination. Assessment for potential risk factor modification, dietary therapy or pharmacologic therapy may be warranted, if clinically indicated. 5. Additional incidental findings, similar to the prior study, as above. Electronically Signed   By: Vinnie Langton M.D.   On: 09/14/2021 08:31   CT Abdomen Pelvis W Contrast  Result Date: 09/14/2021 CLINICAL DATA:  60 year old male with history of small cell lung cancer diagnosed in May 2021. Chemotherapy ongoing. Status post radiation therapy to the left adrenal gland in summer of 2022. Follow-up evaluation. EXAM: CT CHEST, ABDOMEN, AND PELVIS WITH CONTRAST TECHNIQUE: Multidetector CT imaging of the chest, abdomen  and pelvis was performed following the standard protocol during bolus administration of intravenous contrast. CONTRAST:  43mL OMNIPAQUE IOHEXOL 350 MG/ML SOLN COMPARISON:  CT of the chest, abdomen and pelvis 08/01/2021. FINDINGS: CT CHEST FINDINGS Cardiovascular: Heart size is normal. There is no significant pericardial fluid, thickening or pericardial calcification. There is aortic atherosclerosis, as well as atherosclerosis of the great vessels of the mediastinum and the coronary arteries, including calcified atherosclerotic plaque in the left anterior descending and right coronary arteries. Right internal jugular single-lumen porta cath with tip terminating in the distal superior vena cava. Mediastinum/Nodes: No pathologically enlarged mediastinal or hilar lymph nodes. Esophagus is unremarkable in appearance. No axillary lymphadenopathy. Lungs/Pleura: Previously treated left upper lobe lesion is now a thick-walled cavitary area (axial image 58 of series 6) measuring approximately 2.3 x 1.4 cm, similar in appearance to the prior study. Cephalad to the cavitary portion of this lesion there  is a more solid-appearing nodule measuring 1.0 x 0.7 cm (axial image 52 of series 6), also similar to the prior examination. Multiple adjacent areas of pleuroparenchymal nodularity are noted in the left upper lobe extending to the apex, in addition to contralateral pleuroparenchymal nodularity in the apex, likely to reflect chronic post infectious or inflammatory scarring. In the paramediastinal aspect of the left upper and lower lobes there is extensive chronic architectural distortion with volume loss and chronic areas of bronchiectasis, most compatible with chronic postradiation mass-like fibrosis, similar to the prior examination. No other definite suspicious appearing pulmonary nodules or masses are noted. No acute consolidative airspace disease. No pleural effusions. Musculoskeletal: Sclerosis in the superior aspect of the  sternum (sagittal image 82 of series 5) is slightly more apparent than the prior examination, likely to represent a treated metastatic lesion. 7 mm sclerotic lesion in T8 vertebral body (sagittal image 93 of series 5), unchanged. CT ABDOMEN PELVIS FINDINGS Hepatobiliary: No suspicious cystic or solid hepatic lesions. No intra or extrahepatic biliary ductal dilatation. Gallbladder is normal in appearance. Pancreas: No pancreatic mass. No pancreatic ductal dilatation. No pancreatic or peripancreatic fluid collections or inflammatory changes. Spleen: Unremarkable. Adrenals/Urinary Tract: 2.3 cm low-attenuation lesion without enhancement in the interpolar region of the right kidney, compatible with a simple cyst. In the interpolar collecting system of the left kidney there is a 3 mm nonobstructive calculus. Bilateral adrenal glands are normal in appearance. Specifically, the previously treated left adrenal mass is no longer apparent. No hydroureteronephrosis. Urinary bladder is completely decompressed, but otherwise unremarkable in appearance. Stomach/Bowel: Normal appearance of the stomach. No pathologic dilatation of small bowel or colon. A few scattered colonic diverticulae are noted, without surrounding inflammatory changes to suggest an acute diverticulitis at this time. Normal appendix. Vascular/Lymphatic: Aortic atherosclerosis, without evidence of aneurysm or dissection in the abdominal or pelvic vasculature. No lymphadenopathy noted in the abdomen or pelvis. Reproductive: Brachytherapy implants throughout the prostate gland. Other: No significant volume of ascites.  No pneumoperitoneum. Musculoskeletal: 1.0 cm sclerotic lesion in the right proximal femur (axial image 138 of series 2), similar to the prior examination. 1.3 cm sclerotic lesion in the left femoral head (axial image 130 of series 2), similar to the prior examination. Old healed fracture of the anterior aspect of the left second rib. IMPRESSION: 1.  Stable post treatment related findings in the thorax, with persistent cavitation of the primary left upper lobe lesion, as well as adjacent small solid component, as detailed above. No new suspicious appearing pulmonary nodules or masses are noted. No mediastinal or hilar lymphadenopathy. 2. Treated left adrenal lesion is no longer evident. 3. Multiple sclerotic lesions scattered throughout the visualized axial and appendicular skeleton, similar to the prior study, likely to represent treated metastatic disease. 4. Aortic atherosclerosis, in addition to two vessel coronary artery disease. Please note that although the presence of coronary artery calcium documents the presence of coronary artery disease, the severity of this disease and any potential stenosis cannot be assessed on this non-gated CT examination. Assessment for potential risk factor modification, dietary therapy or pharmacologic therapy may be warranted, if clinically indicated. 5. Additional incidental findings, similar to the prior study, as above. Electronically Signed   By: Vinnie Langton M.D.   On: 09/14/2021 08:31     ASSESSMENT AND PLAN: This is a very pleasant 60 years old white male recently diagnosed with extensive stage small cell lung cancer presented with left upper lobe lung mass in addition to left hilar and  mediastinal lymphadenopathy as well as solitary brain metastasis diagnosed in May 2021. He underwent palliative radiotherapy to the left hilar and mediastinal lymphadenopathy in addition to the radiotherapy to the brain metastasis.  He continues to have residual odynophagia and dysphagia from the palliative radiotherapy. He is currently undergoing systemic chemotherapy with carboplatin for AUC of 5 on day 1, etoposide 100 mg/M2 on days 1, 2 and 3 with Cosela infusion before chemotherapy.  He is status post 14 cycles of treatment  Starting from cycle #5 the patient is on treatment with maintenance Imfinzi every 4 weeks. 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  His treatment was discontinued. He started treatment with systemic chemotherapy again with carboplatin for AUC of 5 on day 1 and 2 etoposide 100 Mg/M2 on days, 1, 2 and 3 every 3 weeks with Cosela status post 5 cycles. The patient continues to tolerate this treatment well with no concerning adverse effect except for fatigue. I recommended for him to proceed with cycle #6 today as planned. I will see him back for follow-up visit in 3 weeks for evaluation with repeat CT scan of the chest, abdomen and pelvis for restaging of his disease. For the chemotherapy-induced anemia, will continue to monitor and consider the patient for PRBCs transfusion on as-needed basis. For the hyponatremia secondary to SIADH, his serum sodium has significant improvement since starting systemic chemotherapy.  We will continue to monitor for now. For smoking cessation I strongly encouraged the patient to quit smoking. The patient was advised to call immediately if he has any other concerning symptoms in the interval.  The patient voices understanding of current disease status and treatment options and is in agreement with the current care plan.  All questions were answered. The patient knows to call the clinic with any problems, questions or concerns. We can certainly see the patient much sooner if necessary.   Disclaimer: This note was dictated with voice recognition software. Similar sounding words can inadvertently be transcribed and may not be corrected upon review.

## 2021-10-06 NOTE — Progress Notes (Signed)
ANC 1.4  MD ok to treat/ proceed with chemotherapy.

## 2021-10-07 ENCOUNTER — Inpatient Hospital Stay: Payer: 59

## 2021-10-07 VITALS — BP 117/77 | HR 93 | Temp 98.0°F | Resp 16

## 2021-10-07 DIAGNOSIS — C3412 Malignant neoplasm of upper lobe, left bronchus or lung: Secondary | ICD-10-CM

## 2021-10-07 DIAGNOSIS — Z5111 Encounter for antineoplastic chemotherapy: Secondary | ICD-10-CM | POA: Diagnosis not present

## 2021-10-07 MED ORDER — TRILACICLIB DIHYDROCHLORIDE INJECTION 300 MG
240.0000 mg/m2 | Freq: Once | INTRAVENOUS | Status: AC
  Administered 2021-10-07: 435 mg via INTRAVENOUS
  Filled 2021-10-07: qty 29

## 2021-10-07 MED ORDER — SODIUM CHLORIDE 0.9 % IV SOLN: Freq: Once | INTRAVENOUS | Status: AC

## 2021-10-07 MED ORDER — SODIUM CHLORIDE 0.9 % IV SOLN
10.0000 mg | Freq: Once | INTRAVENOUS | Status: AC
  Administered 2021-10-07: 10 mg via INTRAVENOUS
  Filled 2021-10-07: qty 10

## 2021-10-07 MED ORDER — SODIUM CHLORIDE 0.9% FLUSH
10.0000 mL | INTRAVENOUS | Status: DC | PRN
  Administered 2021-10-07: 10 mL

## 2021-10-07 MED ORDER — SODIUM CHLORIDE 0.9 % IV SOLN
100.0000 mg/m2 | Freq: Once | INTRAVENOUS | Status: AC
  Administered 2021-10-07: 180 mg via INTRAVENOUS
  Filled 2021-10-07: qty 9

## 2021-10-07 MED ORDER — HEPARIN SOD (PORK) LOCK FLUSH 100 UNIT/ML IV SOLN
500.0000 [IU] | Freq: Once | INTRAVENOUS | Status: AC | PRN
  Administered 2021-10-07: 500 [IU]

## 2021-10-07 MED FILL — Dexamethasone Sodium Phosphate Inj 100 MG/10ML: INTRAMUSCULAR | Qty: 1 | Status: AC

## 2021-10-07 NOTE — Patient Instructions (Signed)
[facility]  Discharge Instructions: Thank you for choosing [facility] to provide your oncology and hematology care.   If you have a lab appointment with the [facility], please go directly to the [facility] and check in at the registration area.   Wear comfortable clothing and clothing appropriate for easy access to any Portacath or PICC line.   We strive to give you quality time with your provider. You may need to reschedule your appointment if you arrive late (15 or more minutes).  Arriving late affects you and other patients whose appointments are after yours.  Also, if you miss three or more appointments without notifying the office, you may be dismissed from the clinic at the provider's discretion.      For prescription refill requests, have your pharmacy contact our office and allow 72 hours for refills to be completed.    Today you received the following chemotherapy and/or immunotherapy agents etoposide, cosela      To help prevent nausea and vomiting after your treatment, we encourage you to take your nausea medication as directed.  BELOW ARE SYMPTOMS THAT SHOULD BE REPORTED IMMEDIATELY: *FEVER GREATER THAN 100.4 F (38 C) OR HIGHER *CHILLS OR SWEATING *NAUSEA AND VOMITING THAT IS NOT CONTROLLED WITH YOUR NAUSEA MEDICATION *UNUSUAL SHORTNESS OF BREATH *UNUSUAL BRUISING OR BLEEDING *URINARY PROBLEMS (pain or burning when urinating, or frequent urination) *BOWEL PROBLEMS (unusual diarrhea, constipation, pain near the anus) TENDERNESS IN MOUTH AND THROAT WITH OR WITHOUT PRESENCE OF ULCERS (sore throat, sores in mouth, or a toothache) UNUSUAL RASH, SWELLING OR PAIN  UNUSUAL VAGINAL DISCHARGE OR ITCHING   Items with * indicate a potential emergency and should be followed up as soon as possible or go to the Emergency Department if any problems should occur.  Please show the CHEMOTHERAPY ALERT CARD or IMMUNOTHERAPY ALERT CARD at  check-in to the Emergency Department and triage nurse.  Should you have questions after your visit or need to cancel or reschedule your appointment, please contact [facility]  Dept: [phone]  and follow the prompts.  Office hours are 8:00 a.m. to 4:30 p.m. Monday - Friday. Please note that voicemails left after 4:00 p.m. may not be returned until the following business day.  We are closed weekends and major holidays. You have access to a nurse at all times for urgent questions. Please call the main number to the clinic Dept: [phone] and follow the prompts.   For any non-urgent questions, you may also contact your provider using MyChart. We now offer e-Visits for anyone 38 and older to request care online for non-urgent symptoms. For details visit [email or web address].   Also download the MyChart app! Go to the app store, search "MyChart", open the app, select [facility], and log in with your MyChart username and password.  Due to Covid, a mask is required upon entering the hospital/clinic. If you do not have a mask, one will be given to you upon arrival. For doctor visits, patients may have 1 support person aged 31 or older with them. For treatment visits, patients cannot have anyone with them due to current Covid guidelines and our immunocompromised population.

## 2021-10-08 ENCOUNTER — Inpatient Hospital Stay: Payer: 59

## 2021-10-08 ENCOUNTER — Other Ambulatory Visit: Payer: Self-pay

## 2021-10-08 VITALS — BP 111/84 | HR 83 | Resp 14 | Wt 149.0 lb

## 2021-10-08 DIAGNOSIS — Z5111 Encounter for antineoplastic chemotherapy: Secondary | ICD-10-CM | POA: Diagnosis not present

## 2021-10-08 DIAGNOSIS — C3412 Malignant neoplasm of upper lobe, left bronchus or lung: Secondary | ICD-10-CM

## 2021-10-08 MED ORDER — TRILACICLIB DIHYDROCHLORIDE INJECTION 300 MG
240.0000 mg/m2 | Freq: Once | INTRAVENOUS | Status: AC
  Administered 2021-10-08: 435 mg via INTRAVENOUS
  Filled 2021-10-08: qty 29

## 2021-10-08 MED ORDER — SODIUM CHLORIDE 0.9 % IV SOLN
100.0000 mg/m2 | Freq: Once | INTRAVENOUS | Status: AC
  Administered 2021-10-08: 180 mg via INTRAVENOUS
  Filled 2021-10-08: qty 9

## 2021-10-08 MED ORDER — SODIUM CHLORIDE 0.9 % IV SOLN: Freq: Once | INTRAVENOUS | Status: AC

## 2021-10-08 MED ORDER — HEPARIN SOD (PORK) LOCK FLUSH 100 UNIT/ML IV SOLN
500.0000 [IU] | Freq: Once | INTRAVENOUS | Status: AC | PRN
  Administered 2021-10-08: 500 [IU]

## 2021-10-08 MED ORDER — SODIUM CHLORIDE 0.9% FLUSH
10.0000 mL | INTRAVENOUS | Status: DC | PRN
  Administered 2021-10-08: 10 mL

## 2021-10-08 MED ORDER — DEXAMETHASONE SODIUM PHOSPHATE 100 MG/10ML IJ SOLN
10.0000 mg | Freq: Once | INTRAMUSCULAR | Status: AC
  Administered 2021-10-08: 10 mg via INTRAVENOUS
  Filled 2021-10-08: qty 10

## 2021-10-08 NOTE — Patient Instructions (Signed)
[facility]  Discharge Instructions: Thank you for choosing [facility] to provide your oncology and hematology care.   If you have a lab appointment with the [facility], please go directly to the [facility] and check in at the registration area.   Wear comfortable clothing and clothing appropriate for easy access to any Portacath or PICC line.   We strive to give you quality time with your provider. You may need to reschedule your appointment if you arrive late (15 or more minutes).  Arriving late affects you and other patients whose appointments are after yours.  Also, if you miss three or more appointments without notifying the office, you may be dismissed from the clinic at the provider's discretion.      For prescription refill requests, have your pharmacy contact our office and allow 72 hours for refills to be completed.    Today you received the following chemotherapy and/or immunotherapy agents: Cosela & Etoposide   To help prevent nausea and vomiting after your treatment, we encourage you to take your nausea medication as directed.  BELOW ARE SYMPTOMS THAT SHOULD BE REPORTED IMMEDIATELY: *FEVER GREATER THAN 100.4 F (38 C) OR HIGHER *CHILLS OR SWEATING *NAUSEA AND VOMITING THAT IS NOT CONTROLLED WITH YOUR NAUSEA MEDICATION *UNUSUAL SHORTNESS OF BREATH *UNUSUAL BRUISING OR BLEEDING *URINARY PROBLEMS (pain or burning when urinating, or frequent urination) *BOWEL PROBLEMS (unusual diarrhea, constipation, pain near the anus) TENDERNESS IN MOUTH AND THROAT WITH OR WITHOUT PRESENCE OF ULCERS (sore throat, sores in mouth, or a toothache) UNUSUAL RASH, SWELLING OR PAIN  UNUSUAL VAGINAL DISCHARGE OR ITCHING   Items with * indicate a potential emergency and should be followed up as soon as possible or go to the Emergency Department if any problems should occur.  Please show the CHEMOTHERAPY ALERT CARD or IMMUNOTHERAPY ALERT CARD at  check-in to the Emergency Department and triage nurse.  Should you have questions after your visit or need to cancel or reschedule your appointment, please contact [facility]  Dept: [phone]  and follow the prompts.  Office hours are 8:00 a.m. to 4:30 p.m. Monday - Friday. Please note that voicemails left after 4:00 p.m. may not be returned until the following business day.  We are closed weekends and major holidays. You have access to a nurse at all times for urgent questions. Please call the main number to the clinic Dept: [phone] and follow the prompts.   For any non-urgent questions, you may also contact your provider using MyChart. We now offer e-Visits for anyone 64 and older to request care online for non-urgent symptoms. For details visit [email or web address].   Also download the MyChart app! Go to the app store, search "MyChart", open the app, select [facility], and log in with your MyChart username and password.  Due to Covid, a mask is required upon entering the hospital/clinic. If you do not have a mask, one will be given to you upon arrival. For doctor visits, patients may have 1 support person aged 65 or older with them. For treatment visits, patients cannot have anyone with them due to current Covid guidelines and our immunocompromised population.

## 2021-10-09 ENCOUNTER — Other Ambulatory Visit: Payer: Self-pay

## 2021-10-09 DIAGNOSIS — C7931 Secondary malignant neoplasm of brain: Secondary | ICD-10-CM

## 2021-10-13 ENCOUNTER — Other Ambulatory Visit: Payer: Self-pay

## 2021-10-13 ENCOUNTER — Inpatient Hospital Stay: Payer: 59

## 2021-10-13 DIAGNOSIS — D6481 Anemia due to antineoplastic chemotherapy: Secondary | ICD-10-CM

## 2021-10-13 DIAGNOSIS — C3412 Malignant neoplasm of upper lobe, left bronchus or lung: Secondary | ICD-10-CM

## 2021-10-13 DIAGNOSIS — Z5111 Encounter for antineoplastic chemotherapy: Secondary | ICD-10-CM | POA: Diagnosis not present

## 2021-10-13 DIAGNOSIS — Z95828 Presence of other vascular implants and grafts: Secondary | ICD-10-CM

## 2021-10-13 LAB — CBC WITH DIFFERENTIAL (CANCER CENTER ONLY)
Abs Immature Granulocytes: 0.07 10*3/uL (ref 0.00–0.07)
Basophils Absolute: 0 10*3/uL (ref 0.0–0.1)
Basophils Relative: 1 %
Eosinophils Absolute: 0 10*3/uL (ref 0.0–0.5)
Eosinophils Relative: 1 %
HCT: 21.7 % — ABNORMAL LOW (ref 39.0–52.0)
Hemoglobin: 7.3 g/dL — ABNORMAL LOW (ref 13.0–17.0)
Immature Granulocytes: 2 %
Lymphocytes Relative: 10 %
Lymphs Abs: 0.3 10*3/uL — ABNORMAL LOW (ref 0.7–4.0)
MCH: 32.7 pg (ref 26.0–34.0)
MCHC: 33.6 g/dL (ref 30.0–36.0)
MCV: 97.3 fL (ref 80.0–100.0)
Monocytes Absolute: 0.1 10*3/uL (ref 0.1–1.0)
Monocytes Relative: 3 %
Neutro Abs: 2.9 10*3/uL (ref 1.7–7.7)
Neutrophils Relative %: 83 %
Platelet Count: 168 10*3/uL (ref 150–400)
RBC: 2.23 MIL/uL — ABNORMAL LOW (ref 4.22–5.81)
RDW: 18.6 % — ABNORMAL HIGH (ref 11.5–15.5)
WBC Count: 3.4 10*3/uL — ABNORMAL LOW (ref 4.0–10.5)
nRBC: 0 % (ref 0.0–0.2)

## 2021-10-13 LAB — CMP (CANCER CENTER ONLY)
ALT: 20 U/L (ref 0–44)
AST: 23 U/L (ref 15–41)
Albumin: 3.7 g/dL (ref 3.5–5.0)
Alkaline Phosphatase: 76 U/L (ref 38–126)
Anion gap: 8 (ref 5–15)
BUN: 20 mg/dL (ref 6–20)
CO2: 27 mmol/L (ref 22–32)
Calcium: 9.5 mg/dL (ref 8.9–10.3)
Chloride: 103 mmol/L (ref 98–111)
Creatinine: 0.84 mg/dL (ref 0.61–1.24)
GFR, Estimated: >60 mL/min (ref >60)
Glucose, Bld: 125 mg/dL — ABNORMAL HIGH (ref 70–99)
Potassium: 4.1 mmol/L (ref 3.5–5.1)
Sodium: 138 mmol/L (ref 135–145)
Total Bilirubin: 0.3 mg/dL (ref 0.3–1.2)
Total Protein: 6.9 g/dL (ref 6.5–8.1)

## 2021-10-13 LAB — SAMPLE TO BLOOD BANK

## 2021-10-13 LAB — PREPARE RBC (CROSSMATCH)

## 2021-10-13 MED ORDER — HEPARIN SOD (PORK) LOCK FLUSH 100 UNIT/ML IV SOLN
500.0000 [IU] | Freq: Once | INTRAVENOUS | Status: AC
  Administered 2021-10-13: 500 [IU]

## 2021-10-13 MED ORDER — SODIUM CHLORIDE 0.9% FLUSH
10.0000 mL | Freq: Once | INTRAVENOUS | Status: AC
  Administered 2021-10-13: 10 mL

## 2021-10-13 MED ORDER — DIPHENHYDRAMINE HCL 25 MG PO CAPS: 25.0000 mg | ORAL_CAPSULE | Freq: Once | ORAL | Status: DC

## 2021-10-14 ENCOUNTER — Inpatient Hospital Stay: Payer: 59

## 2021-10-14 DIAGNOSIS — D6481 Anemia due to antineoplastic chemotherapy: Secondary | ICD-10-CM

## 2021-10-14 DIAGNOSIS — Z5111 Encounter for antineoplastic chemotherapy: Secondary | ICD-10-CM | POA: Diagnosis not present

## 2021-10-14 MED ORDER — HEPARIN SOD (PORK) LOCK FLUSH 100 UNIT/ML IV SOLN
500.0000 [IU] | Freq: Every day | INTRAVENOUS | Status: AC | PRN
  Administered 2021-10-14: 500 [IU]

## 2021-10-14 MED ORDER — SODIUM CHLORIDE 0.9% FLUSH
10.0000 mL | INTRAVENOUS | Status: AC | PRN
  Administered 2021-10-14: 10 mL

## 2021-10-14 MED ORDER — ACETAMINOPHEN 325 MG PO TABS
650.0000 mg | ORAL_TABLET | Freq: Once | ORAL | Status: AC
  Administered 2021-10-14: 650 mg via ORAL
  Filled 2021-10-14: qty 2

## 2021-10-14 MED ORDER — DIPHENHYDRAMINE HCL 25 MG PO CAPS
25.0000 mg | ORAL_CAPSULE | Freq: Once | ORAL | Status: AC
  Administered 2021-10-14: 25 mg via ORAL
  Filled 2021-10-14: qty 1

## 2021-10-14 MED ORDER — SODIUM CHLORIDE 0.9% IV SOLUTION
250.0000 mL | Freq: Once | INTRAVENOUS | Status: AC
  Administered 2021-10-14: 250 mL via INTRAVENOUS

## 2021-10-14 NOTE — Patient Instructions (Signed)
Blood Transfusion, Adult, Care After This sheet gives you information about how to care for yourself after your procedure. Your doctor may also give you more specific instructions. If you have problems or questions, contact your doctor. What can I expect after the procedure? After the procedure, it is common to have: Bruising and soreness at the IV site. A headache. Follow these instructions at home: Insertion site care   Follow instructions from your doctor about how to take care of your insertion site. This is where an IV tube was put into your vein. Make sure you: Wash your hands with soap and water before and after you change your bandage (dressing). If you cannot use soap and water, use hand sanitizer. Change your bandage as told by your doctor. Check your insertion site every day for signs of infection. Check for: Redness, swelling, or pain. Bleeding from the site. Warmth. Pus or a bad smell. General instructions Take over-the-counter and prescription medicines only as told by your doctor. Rest as told by your doctor. Go back to your normal activities as told by your doctor. Keep all follow-up visits as told by your doctor. This is important. Contact a doctor if: You have itching or red, swollen areas of skin (hives). You feel worried or nervous (anxious). You feel weak after doing your normal activities. You have redness, swelling, warmth, or pain around the insertion site. You have blood coming from the insertion site, and the blood does not stop with pressure. You have pus or a bad smell coming from the insertion site. Get help right away if: You have signs of a serious reaction. This may be coming from an allergy or the body's defense system (immune system). Signs include: Trouble breathing or shortness of breath. Swelling of the face or feeling warm (flushed). Fever or chills. Head, chest, or back pain. Dark pee (urine) or blood in the pee. Widespread rash. Fast  heartbeat. Feeling dizzy or light-headed. You may receive your blood transfusion in an outpatient setting. If so, you will be told whom to contact to report any reactions. These symptoms may be an emergency. Do not wait to see if the symptoms will go away. Get medical help right away. Call your local emergency services (911 in the [place] Do not drive yourself to the hospital. Summary Bruising and soreness at the IV site are common. Check your insertion site every day for signs of infection. Rest as told by your doctor. Go back to your normal activities as told by your doctor. Get help right away if you have signs of a serious reaction. This information is not intended to replace advice given to you by your health care provider. Make sure you discuss any questions you have with your health care provider. Document Revised: 03/06/2021 Document Reviewed: 05/04/2019 Elsevier Patient Education  2022 [employer]  

## 2021-10-15 LAB — TYPE AND SCREEN
ABO/RH(D): A NEG
Antibody Screen: NEGATIVE
Unit division: 0
Unit division: 0

## 2021-10-15 LAB — BPAM RBC
Blood Product Expiration Date: 202212212359
Blood Product Expiration Date: 202212252359
ISSUE DATE / TIME: 202211220749
ISSUE DATE / TIME: 202211220749
Unit Type and Rh: 600
Unit Type and Rh: 600

## 2021-10-20 ENCOUNTER — Inpatient Hospital Stay: Payer: 59

## 2021-10-20 ENCOUNTER — Other Ambulatory Visit: Payer: Self-pay | Admitting: Physician Assistant

## 2021-10-20 ENCOUNTER — Ambulatory Visit (HOSPITAL_COMMUNITY)
Admission: RE | Admit: 2021-10-20 | Discharge: 2021-10-20 | Disposition: A | Discharge status: Home / Self Care | Payer: 59 | Source: Ambulatory Visit | Attending: Internal Medicine | Admitting: Internal Medicine

## 2021-10-20 ENCOUNTER — Other Ambulatory Visit: Payer: Self-pay

## 2021-10-20 DIAGNOSIS — C3412 Malignant neoplasm of upper lobe, left bronchus or lung: Secondary | ICD-10-CM

## 2021-10-20 DIAGNOSIS — D6481 Anemia due to antineoplastic chemotherapy: Secondary | ICD-10-CM

## 2021-10-20 DIAGNOSIS — C3490 Malignant neoplasm of unspecified part of unspecified bronchus or lung: Secondary | ICD-10-CM | POA: Insufficient documentation

## 2021-10-20 DIAGNOSIS — R110 Nausea: Secondary | ICD-10-CM

## 2021-10-20 DIAGNOSIS — C7931 Secondary malignant neoplasm of brain: Secondary | ICD-10-CM

## 2021-10-20 DIAGNOSIS — Z95828 Presence of other vascular implants and grafts: Secondary | ICD-10-CM

## 2021-10-20 LAB — CMP (CANCER CENTER ONLY)
ALT: 22 U/L (ref 0–44)
AST: 26 U/L (ref 15–41)
Albumin: 3.8 g/dL (ref 3.5–5.0)
Alkaline Phosphatase: 89 U/L (ref 38–126)
Anion gap: 8 (ref 5–15)
BUN: 18 mg/dL (ref 6–20)
CO2: 28 mmol/L (ref 22–32)
Calcium: 9.3 mg/dL (ref 8.9–10.3)
Chloride: 105 mmol/L (ref 98–111)
Creatinine: 1.01 mg/dL (ref 0.61–1.24)
GFR, Estimated: >60 mL/min (ref >60)
Glucose, Bld: 117 mg/dL — ABNORMAL HIGH (ref 70–99)
Potassium: 4.3 mmol/L (ref 3.5–5.1)
Sodium: 141 mmol/L (ref 135–145)
Total Bilirubin: 0.4 mg/dL (ref 0.3–1.2)
Total Protein: 7.1 g/dL (ref 6.5–8.1)

## 2021-10-20 LAB — CBC WITH DIFFERENTIAL (CANCER CENTER ONLY)
Abs Immature Granulocytes: 0.01 10*3/uL (ref 0.00–0.07)
Basophils Absolute: 0 10*3/uL (ref 0.0–0.1)
Basophils Relative: 1 %
Eosinophils Absolute: 0 10*3/uL (ref 0.0–0.5)
Eosinophils Relative: 2 %
HCT: 26.9 % — ABNORMAL LOW (ref 39.0–52.0)
Hemoglobin: 9.2 g/dL — ABNORMAL LOW (ref 13.0–17.0)
Immature Granulocytes: 1 %
Lymphocytes Relative: 22 %
Lymphs Abs: 0.4 10*3/uL — ABNORMAL LOW (ref 0.7–4.0)
MCH: 32.4 pg (ref 26.0–34.0)
MCHC: 34.2 g/dL (ref 30.0–36.0)
MCV: 94.7 fL (ref 80.0–100.0)
Monocytes Absolute: 0.4 10*3/uL (ref 0.1–1.0)
Monocytes Relative: 21 %
Neutro Abs: 1.1 10*3/uL — ABNORMAL LOW (ref 1.7–7.7)
Neutrophils Relative %: 53 %
Platelet Count: 55 10*3/uL — ABNORMAL LOW (ref 150–400)
RBC: 2.84 MIL/uL — ABNORMAL LOW (ref 4.22–5.81)
RDW: 16.7 % — ABNORMAL HIGH (ref 11.5–15.5)
WBC Count: 2 10*3/uL — ABNORMAL LOW (ref 4.0–10.5)
nRBC: 0 % (ref 0.0–0.2)

## 2021-10-20 LAB — SAMPLE TO BLOOD BANK

## 2021-10-20 MED ORDER — HEPARIN SOD (PORK) LOCK FLUSH 100 UNIT/ML IV SOLN
INTRAVENOUS | Status: AC
  Filled 2021-10-20: qty 5

## 2021-10-20 MED ORDER — IOHEXOL 350 MG/ML SOLN
80.0000 mL | Freq: Once | INTRAVENOUS | Status: AC | PRN
  Administered 2021-10-20: 18:00:00 80 mL via INTRAVENOUS

## 2021-10-20 MED ORDER — DIATRIZOATE MEGLUMINE & SODIUM 66-10 % PO SOLN
ORAL | Status: AC
  Administered 2021-10-20: 18:00:00 30 mL
  Filled 2021-10-20: qty 30

## 2021-10-20 MED ORDER — DIATRIZOATE MEGLUMINE & SODIUM 66-10 % PO SOLN: 15.0000 mL | ORAL | Status: AC

## 2021-10-20 MED ORDER — SODIUM CHLORIDE 0.9% FLUSH
10.0000 mL | Freq: Once | INTRAVENOUS | Status: AC
  Administered 2021-10-20: 15:00:00 10 mL

## 2021-10-24 NOTE — Progress Notes (Signed)
[place] OFFICE PROGRESS NOTE  [name], [place] [id number]  DIAGNOSIS:  Extensive stage (T2b, N2, M1c) small cell lung cancer presented with left upper lobe pulmonary nodule in addition to left hilar mass with mediastinal invasion and solitary brain metastasis diagnosed in May 2021  PRIOR THERAPY: 1)  Palliative radiation to the left hilar and mediastinal lymphadenopathy under the care of Dr. Lisbeth Renshaw. 2) Cranial irradiation from 08/26/20-09/06/20 under the care of Dr. Lisbeth Renshaw 3)  Radiation to the enlarging left adrenal nodules under the care of Dr. Lisbeth Renshaw. Completed in July 2022.  3) Palliative systemic chemotherapy with carboplatin for AUC of 5 on day 1, etoposide 100 mg/M2 on days 1, 2 and 3 as well as Imfinzi 1500 mg IV every 3 weeks with the chemotherapy.  First dose of chemotherapy May 06, 2020.  The patient will receive treatment with Cosela on the days of his chemotherapy.  Status post 14 cycles.  Starting from cycle #5 he started maintenance immunotherapy with Imfinzi 1500 mg IV every 4 weeks. Discontinued due to disease progression. Last dose 05/29/21.  4) Carboplatin for an AUC of 5 and etoposide 100 mg/M2 on days 1, 2, and 3 IV every 3 weeks Neulasta or Cosela.  First dose expected on 06/24/2021.  Status post 6 cycles.  CURRENT THERAPY: Observation   INTERVAL HISTORY: Richard Santos 60 y.o. male returns to the clinic today for a follow-up visit accompanied by his wife.  The patient recently completed 6 cycles of additional chemotherapy with carboplatin and etoposide.  He tolerated the last cycle of treatment well without any concerning adverse side effects except for fatigue and pancytopenia for which he occasionally requires transfusion support. His platelet count was 55k last week and he noticed more upper extremity bruising last week. He also noted it took longer to stop bleeding after his port accessed last week. He also takes 81 mg aspirin.  Today he denies any fever, chills, or night sweats.  He reports a good appetite and his weight is stable.  He has been having issues prior to chemotherapy with hyponatremia which has since resolved with the treatment of his small cell lung cancer with chemotherapy.  He reports his baseline dyspnea on exertion. [place] the mountains recently and he states he walked 3 miles, not consecutively but total and felt well. He states sometimes after a quarter mile he needs to rest.  Denies any chest pain, cough, or hemoptysis.  Denies any vomiting, diarrhea, or constipation. He has mild nausea which improves with his anti-emetics. He continues to have some dry itching skin on his right abdomen along the belt line. No rashes or vesicles. Denies any headache or visual changes. He is scheduled for a repeat brain MRI next week on 11/06/21. The patient recently had a restaging CT scan performed.  The patient is here today for evaluation and to review his scan results.  MEDICAL HISTORY: Past Medical History:  Diagnosis Date   Allergic rhinitis    Allergy    Anxiety    Chronic low back pain    DDD (degenerative disc disease), lumbar    ED (erectile dysfunction)    Finger injury    cut pads off 3rd and 4th finger left hand/due to lawn mower accident   GERD (gastroesophageal reflux disease)    Heart murmur    as child only-    History of kidney stones    HTN (hypertension) 08/20/2016  Hyperlipidemia    Incomplete right bundle branch block    Prostate cancer ([place]) dx 10/02/14   stage T1c   Sigmoid diverticulosis    Small cell lung cancer ([place]) 03/2020   Wears glasses     ALLERGIES:  is allergic to bee venom, elemental sulfur, other, sulfa antibiotics, and namenda [memantine].  MEDICATIONS:  Current Outpatient Medications  Medication Sig Dispense Refill   aspirin EC 81 MG tablet Take 81 mg by mouth daily. Swallow whole.     Budeson-Glycopyrrol-Formoterol (BREZTRI AEROSPHERE) 160-9-4.8 MCG/ACT AERO  Inhale 2 puffs into the lungs in the morning and at bedtime. 10.7 g 11   busPIRone (BUSPAR) 10 MG tablet Take 10 mg by mouth 2 (two) times daily.     famotidine (PEPCID) 20 MG tablet Take 1 tablet (20 mg total) by mouth 2 (two) times daily. 60 tablet 2   lovastatin (MEVACOR) 20 MG tablet Take 1 tablet (20 mg total) by mouth at bedtime. 90 tablet 3   Multiple Vitamin (MULTIVITAMIN) capsule Take 1 capsule by mouth daily.     ondansetron (ZOFRAN-ODT) 8 MG disintegrating tablet TAKE 1 TABLET BY MOUTH EVERY 8 HOURS AS NEEDED FOR NAUSEA OR VOMITING. 30 tablet 2   pantoprazole (PROTONIX) 40 MG tablet Take 40 mg by mouth every morning.     prochlorperazine (COMPAZINE) 10 MG tablet TAKE 1 TABLET BY MOUTH EVERY 6 HOURS AS NEEDED 30 tablet 2   sodium bicarbonate 650 MG tablet TAKE 1 TABLET BY MOUTH TWICE A DAY 60 tablet 1   traMADol (ULTRAM) 50 MG tablet TAKE 1/2 TABLET BY MOUTH TWICE A DAY AS NEEDED FOR PAIN 60 tablet 2   No current facility-administered medications for this visit.   Facility-Administered Medications Ordered in Other Visits  Medication Dose Route Frequency Provider Last Rate Last Admin   heparin lock flush 100 unit/mL  500 Units Intracatheter Daily PRN Curt Bears, MD       sodium chloride flush (NS) 0.9 % injection 10 mL  10 mL Intracatheter PRN Curt Bears, MD       sodium chloride flush (NS) 0.9 % injection 10 mL  10 mL Intracatheter PRN Curt Bears, MD   10 mL at 07/16/21 1606    SURGICAL HISTORY:  Past Surgical History:  Procedure Laterality Date   BRONCHIAL BIOPSY  04/12/2020   Procedure: BRONCHIAL BIOPSIES;  Surgeon: Marshell Garfinkel, MD;  Location: [facility];  Service: Cardiopulmonary;;   BRONCHIAL BRUSHINGS  04/12/2020   Procedure: BRONCHIAL BRUSHINGS;  Surgeon: Marshell Garfinkel, MD;  Location: [facility];  Service: Cardiopulmonary;;   BRONCHIAL NEEDLE ASPIRATION BIOPSY  04/12/2020   Procedure: BRONCHIAL NEEDLE ASPIRATION BIOPSIES;  Surgeon: Marshell Garfinkel,  MD;  Location: [facility];  Service: Cardiopulmonary;;   BRONCHIAL WASHINGS  04/12/2020   Procedure: BRONCHIAL WASHINGS;  Surgeon: Marshell Garfinkel, MD;  Location: [facility];  Service: Cardiopulmonary;;   COLONOSCOPY     COLONOSCOPY W/ POLYPECTOMY  04-19-2012   ENDOBRONCHIAL ULTRASOUND N/A 04/12/2020   Procedure: ENDOBRONCHIAL ULTRASOUND;  Surgeon: Marshell Garfinkel, MD;  Location: [facility];  Service: Cardiopulmonary;  Laterality: N/A;   ESOPHAGOGASTRODUODENOSCOPY  08-05-2010   HEMOSTASIS CONTROL  04/12/2020   Procedure: HEMOSTASIS CONTROL;  Surgeon: Marshell Garfinkel, MD;  Location: [facility];  Service: Cardiopulmonary;;   IR IMAGING GUIDED PORT INSERTION  05/31/2020   POLYPECTOMY     PROSTATE BIOPSY  10/02/14   RADIOACTIVE SEED IMPLANT N/A 01/30/2015   Procedure: RADIOACTIVE SEED IMPLANT;  Surgeon: Rana Snare, MD;  Location: [facility];  Service: Urology;  Laterality: N/A;   UPPER GASTROINTESTINAL ENDOSCOPY  04/03/2021   VIDEO BRONCHOSCOPY N/A 04/12/2020   Procedure: VIDEO BRONCHOSCOPY WITHOUT FLUORO;  Surgeon: Marshell Garfinkel, MD;  Location: [facility];  Service: Cardiopulmonary;  Laterality: N/A;    REVIEW OF SYSTEMS:   Review of Systems  Constitutional: Positive for stable fatigue. Negative for appetite change, chills,  fever and unexpected weight change.  HENT: Negative for mouth sores, nosebleeds, sore throat and trouble swallowing.   Eyes: Negative for eye problems and icterus.  Respiratory: Positive for stable dyspnea on exertion. Negative for cough, hemoptysis, and wheezing.   Cardiovascular: Negative for chest pain and leg swelling.  Gastrointestinal: Positive for mild occasional nausea. Negative for abdominal pain, constipation, diarrhea, and vomiting.  Genitourinary: Negative for bladder incontinence, difficulty urinating, dysuria, frequency and hematuria.   Musculoskeletal: Negative for back pain, gait problem, neck pain and neck stiffness.  Skin:   Positive for dry itching skin along right abdomen.  Neurological: Negative for dizziness, extremity weakness, gait problem, headaches, light-headedness and seizures.  Hematological: Negative for adenopathy. Does not bruise/bleed easily.  Psychiatric/Behavioral: Negative for confusion, depression and sleep disturbance. The patient is not nervous/anxious.     PHYSICAL EXAMINATION:  Blood pressure 104/79, pulse (!) 104, temperature (!) 97.1 F (36.2 C), temperature source Tympanic, resp. rate 16, weight 145 lb 7 oz (66 kg), SpO2 100 %.  ECOG PERFORMANCE STATUS: 1  Physical Exam  Constitutional: Oriented to person, place, and time and thin appearing male, and in no distress.  HENT:  Head: Normocephalic and atraumatic.  Mouth/Throat: Oropharynx is clear and moist. No oropharyngeal exudate.  Eyes: Conjunctivae are normal. Right eye exhibits no discharge. Left eye exhibits no discharge. No scleral icterus.  Neck: Normal range of motion. Neck supple.  Cardiovascular: Normal rate, regular rhythm, normal heart sounds and intact distal pulses.   Pulmonary/Chest: Effort normal and breath sounds normal. No respiratory distress. No wheezes. No rales.  Abdominal: Soft. Bowel sounds are normal. Exhibits no distension and no mass. There is no tenderness.  Musculoskeletal: Normal range of motion. Exhibits no edema.  Lymphadenopathy:    No cervical adenopathy.  Neurological: Alert and oriented to person, place, and time. Exhibits normal muscle tone. Gait normal. Coordination normal.  Skin: Skin is warm and dry. Dry skin along right abdomen. Not diaphoretic. No erythema. No pallor.  Psychiatric: Mood, memory and judgment normal.  Vitals reviewed.  LABORATORY DATA: Lab Results  Component Value Date   WBC 1.8 (L) 10/27/2021   HGB 9.1 (L) 10/27/2021   HCT 25.8 (L) 10/27/2021   MCV 94.5 10/27/2021   PLT 166 10/27/2021      Chemistry      Component Value Date/Time   NA 140 10/27/2021 0946   K 4.1  10/27/2021 0946   CL 105 10/27/2021 0946   CO2 27 10/27/2021 0946   BUN 13 10/27/2021 0946   CREATININE 0.88 10/27/2021 0946      Component Value Date/Time   CALCIUM 9.1 10/27/2021 0946   ALKPHOS 92 10/27/2021 0946   AST 32 10/27/2021 0946   ALT 30 10/27/2021 0946   BILITOT 0.3 10/27/2021 0946       RADIOGRAPHIC STUDIES:  CT Chest W Contrast  Result Date: 10/21/2021 CLINICAL DATA:  60 year old male with history of small cell lung cancer. Restaging examination. EXAM: CT CHEST, ABDOMEN, AND PELVIS WITH CONTRAST TECHNIQUE: Multidetector CT imaging of the chest, abdomen and pelvis was performed following the standard protocol during bolus administration of intravenous contrast.  CONTRAST:  88mL OMNIPAQUE IOHEXOL 350 MG/ML SOLN COMPARISON:  Multiple priors, most recently CT the chest, abdomen and pelvis 09/12/2021. FINDINGS: CT CHEST FINDINGS Cardiovascular: Heart size is normal. There is no significant pericardial fluid, thickening or pericardial calcification. There is aortic atherosclerosis, as well as atherosclerosis of the great vessels of the mediastinum and the coronary arteries, including calcified atherosclerotic plaque in the left anterior descending, left circumflex and right coronary arteries. Right internal jugular single-lumen porta cath with tip terminating at the superior cavoatrial junction. Mediastinum/Nodes: No pathologically enlarged mediastinal or hilar lymph nodes. Esophagus is unremarkable in appearance. No axillary lymphadenopathy. Lungs/Pleura: Again noted is a thick-walled cavitary lesion in the left upper lobe (axial image 40 of series 4) which currently measures 2.3 x 1.4 cm (unchanged) at the site of the previously treated left upper lobe lesion. Previously noted area of nodularity cephalad to this has resolved compared to the prior examination. There continues to be extensive architectural distortion and volume loss in the medial aspect of the left upper and lower lobes  where there is also extensive traction bronchiectasis, similar to the prior study, most compatible with an area of chronic postradiation mass-like fibrosis. No definite new suspicious appearing pulmonary nodules or masses are noted. No acute consolidative airspace disease. No pleural effusions. Musculoskeletal: Sclerotic lesion in the superior aspect of the sternum and sclerotic lesions in the T8 vertebra, similar to the prior study. Additional sclerotic lesions in the posterior elements of T6 and 7, similar in retrospect to the prior examination. No definite new osseous lesions. Old healed fracture of the anterior aspect of the left second rib incidentally noted. CT ABDOMEN PELVIS FINDINGS Hepatobiliary: No suspicious appearing cystic or solid hepatic lesions. No intra or extrahepatic biliary ductal dilatation. Gallbladder is normal in appearance. Pancreas: No pancreatic mass. No pancreatic ductal dilatation. No pancreatic or peripancreatic fluid collections or inflammatory changes. Spleen: Unremarkable. Adrenals/Urinary Tract: 2.4 cm low-attenuation lesion in the interpolar region of the right kidney is compatible with a simple cyst. Left kidney and bilateral adrenal glands are normal in appearance. No hydroureteronephrosis. Urinary bladder is nearly completely decompressed, but otherwise unremarkable in appearance. Stomach/Bowel: The appearance of the stomach is normal. No pathologic dilatation of small bowel or colon. Normal appendix. Vascular/Lymphatic: Aortic atherosclerosis, without evidence of aneurysm or dissection in the abdominal or pelvic vasculature. No lymphadenopathy noted in the abdomen or pelvis. Reproductive: Brachytherapy implants throughout the prostate gland. Other: No significant volume of ascites.  No pneumoperitoneum. Musculoskeletal: 1.1 cm sclerotic lesion in the right proximal femur (axial image 132 of series 2), and 1.3 cm sclerotic lesion in the left femoral head (axial image 123 of  series 2) both similar to the prior examination. No new suspicious appearing osseous lesions. IMPRESSION: 1. Stable appearance of treated lesion in the left upper lobe with resolution of adjacent satellite left upper lobe nodularity. No evidence of recurrence of treated left adrenal lesion. Sclerotic lesions throughout the visualized axial and appendicular skeleton appear similar to the prior examination. No new signs of metastatic disease noted elsewhere in the chest, abdomen or pelvis. 2. Aortic atherosclerosis, in addition to three-vessel coronary artery disease. Please note that although the presence of coronary artery calcium documents the presence of coronary artery disease, the severity of this disease and any potential stenosis cannot be assessed on this non-gated CT examination. Assessment for potential risk factor modification, dietary therapy or pharmacologic therapy may be warranted, if clinically indicated. 3. Additional incidental findings, similar to the prior study, as above.  Electronically Signed   By: Vinnie Langton M.D.   On: 10/21/2021 07:15   CT Abdomen Pelvis W Contrast  Result Date: 10/21/2021 CLINICAL DATA:  60 year old male with history of small cell lung cancer. Restaging examination. EXAM: CT CHEST, ABDOMEN, AND PELVIS WITH CONTRAST TECHNIQUE: Multidetector CT imaging of the chest, abdomen and pelvis was performed following the standard protocol during bolus administration of intravenous contrast. CONTRAST:  36mL OMNIPAQUE IOHEXOL 350 MG/ML SOLN COMPARISON:  Multiple priors, most recently CT the chest, abdomen and pelvis 09/12/2021. FINDINGS: CT CHEST FINDINGS Cardiovascular: Heart size is normal. There is no significant pericardial fluid, thickening or pericardial calcification. There is aortic atherosclerosis, as well as atherosclerosis of the great vessels of the mediastinum and the coronary arteries, including calcified atherosclerotic plaque in the left anterior descending,  left circumflex and right coronary arteries. Right internal jugular single-lumen porta cath with tip terminating at the superior cavoatrial junction. Mediastinum/Nodes: No pathologically enlarged mediastinal or hilar lymph nodes. Esophagus is unremarkable in appearance. No axillary lymphadenopathy. Lungs/Pleura: Again noted is a thick-walled cavitary lesion in the left upper lobe (axial image 40 of series 4) which currently measures 2.3 x 1.4 cm (unchanged) at the site of the previously treated left upper lobe lesion. Previously noted area of nodularity cephalad to this has resolved compared to the prior examination. There continues to be extensive architectural distortion and volume loss in the medial aspect of the left upper and lower lobes where there is also extensive traction bronchiectasis, similar to the prior study, most compatible with an area of chronic postradiation mass-like fibrosis. No definite new suspicious appearing pulmonary nodules or masses are noted. No acute consolidative airspace disease. No pleural effusions. Musculoskeletal: Sclerotic lesion in the superior aspect of the sternum and sclerotic lesions in the T8 vertebra, similar to the prior study. Additional sclerotic lesions in the posterior elements of T6 and 7, similar in retrospect to the prior examination. No definite new osseous lesions. Old healed fracture of the anterior aspect of the left second rib incidentally noted. CT ABDOMEN PELVIS FINDINGS Hepatobiliary: No suspicious appearing cystic or solid hepatic lesions. No intra or extrahepatic biliary ductal dilatation. Gallbladder is normal in appearance. Pancreas: No pancreatic mass. No pancreatic ductal dilatation. No pancreatic or peripancreatic fluid collections or inflammatory changes. Spleen: Unremarkable. Adrenals/Urinary Tract: 2.4 cm low-attenuation lesion in the interpolar region of the right kidney is compatible with a simple cyst. Left kidney and bilateral adrenal glands  are normal in appearance. No hydroureteronephrosis. Urinary bladder is nearly completely decompressed, but otherwise unremarkable in appearance. Stomach/Bowel: The appearance of the stomach is normal. No pathologic dilatation of small bowel or colon. Normal appendix. Vascular/Lymphatic: Aortic atherosclerosis, without evidence of aneurysm or dissection in the abdominal or pelvic vasculature. No lymphadenopathy noted in the abdomen or pelvis. Reproductive: Brachytherapy implants throughout the prostate gland. Other: No significant volume of ascites.  No pneumoperitoneum. Musculoskeletal: 1.1 cm sclerotic lesion in the right proximal femur (axial image 132 of series 2), and 1.3 cm sclerotic lesion in the left femoral head (axial image 123 of series 2) both similar to the prior examination. No new suspicious appearing osseous lesions. IMPRESSION: 1. Stable appearance of treated lesion in the left upper lobe with resolution of adjacent satellite left upper lobe nodularity. No evidence of recurrence of treated left adrenal lesion. Sclerotic lesions throughout the visualized axial and appendicular skeleton appear similar to the prior examination. No new signs of metastatic disease noted elsewhere in the chest, abdomen or pelvis. 2. Aortic atherosclerosis,  in addition to three-vessel coronary artery disease. Please note that although the presence of coronary artery calcium documents the presence of coronary artery disease, the severity of this disease and any potential stenosis cannot be assessed on this non-gated CT examination. Assessment for potential risk factor modification, dietary therapy or pharmacologic therapy may be warranted, if clinically indicated. 3. Additional incidental findings, similar to the prior study, as above. Electronically Signed   By: Vinnie Langton M.D.   On: 10/21/2021 07:15     ASSESSMENT/PLAN:  This is a very pleasant 60 year old Caucasian male diagnosed with extensive stage small cell  lung cancer.  He presented with a left upper lobe lung mass in addition to left hilar mediastinal lymphadenopathy.  He also had metastatic disease to the brain.  He was diagnosed in May 2021.   He underwent palliative radiotherapy to the left hilar and mediastinal lymphadenopathy in addition to radiotherapy to the brain in October 2021.   He completed systemic chemotherapy with carboplatin for an AUC of 5 on day 1, etoposide 100 mg per metered squared on days 1, 2, and 3 with cosela infusion before chemotherapy.  He is status post 13 cycles of treatment.  Starting from cycle #5, the patient had been on maintenance immunotherapy IV every 4 weeks.    The patient completed palliative radiation to the enlarging left adrenal nodules under the care of Dr. Lisbeth Renshaw. This was completed in July 2022.   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  He status post 6 cycles and completed this in November 2022.  The patient's hyponatremia improved after the patient restarted chemotherapy.  The patient recently had a restaging CT scan performed.  The patient was seen with Dr. Julien Nordmann.  Dr. Julien Nordmann personally and independently reviewed the scan and discussed the results with the patient today.  The scan showed no evidence for disease progression. Dr. Julien Nordmann discussed that there is no indication to continue chemotherapy at this point beyond 6 cycles. This also would cause progressive myelosuppression/bone marrow damage. His labs show some persistent neutropenia and anemia which will improve with taking a break from treatment. His platelet count improved compared to last week which was likely the reason he had some increased bruising. Hoping for improvement in his labs so if the patient needed to be treated  again with chemotherapy in the future, that his labs would not preclude [place] from doing so. Therefore, Dr. Julien Nordmann recommends giving the patient a break from treatment with short term follow up in 2 months. Reviewed signs and symptoms of progressive disease with the patient and advised to call sooner.   I will arrange for a follow up CT of the chest, abdomen, and pelvis in 2 months. Will arrange labs/flush and scan that day. We will see him back for a follow up visit a few days later to review the results.   Advised to use hydrocortisone cream for the itching.   The patient was advised to call immediately if he has any concerning symptoms in the interval. The patient voices understanding of current disease status and treatment options and is in agreement with the current care plan. All questions were answered. The patient knows to call the clinic with any problems, questions or concerns. We can certainly see the patient  much sooner if necessary         Orders Placed This Encounter  Procedures   CT Chest W Contrast    Standing Status:   Future    Standing Expiration Date:   10/27/2022    Order Specific Question:   If indicated for the ordered procedure, I authorize the administration of contrast media per Radiology protocol    Answer:   Yes    Order Specific Question:   Preferred imaging location?    Answer:   [facility]   CT Abdomen Pelvis W Contrast    Standing Status:   Future    Standing Expiration Date:   10/27/2022    Order Specific Question:   If indicated for the ordered procedure, I authorize the administration of contrast media per Radiology protocol    Answer:   Yes    Order Specific Question:   Preferred imaging location?    Answer:   [facility]    Order Specific Question:   Is Oral Contrast requested for this exam?    Answer:   Yes, Per Radiology protocol   CBC with Differential ([facility] Only)    Standing Status:   Future    Standing Expiration  Date:   10/27/2022   CMP ([place] only)    Standing Status:   Future    Standing Expiration Date:   10/27/2022     Tobe Sos Jeffrey Voth, PA-C 10/27/21  ADDENDUM: Hematology/Oncology Attending: I had a face-to-face encounter with the patient today.  I reviewed his record, lab, scan and recommended his care plan.  This is a very pleasant 60 years old white male diagnosed with extensive stage small cell lung cancer in May 2021 status post systemic chemotherapy with carboplatin, etoposide and Imfinzi for 4 cycles followed by maintenance treatment with immunotherapy with Imfinzi for 10 more cycles before it was discontinued secondary to disease progression.  The patient also underwent palliative radiotherapy to the brain, mediastinal lymph node as well as right adrenal gland metastasis. 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  He is status post 6 cycles of this treatment and tolerated his treatment fairly well except for the chemotherapy-induced anemia. He had repeat CT scan of the chest, abdomen pelvis performed recently.  I personally and independently reviewed the scan and discussed the results with the patient and his wife. I recommended for the patient to continue on observation for now with repeat CT scan of the chest, abdomen pelvis in 2 months for restaging of his disease.  The patient understands that there is a very high risk for disease progression again and small cell lung cancer but there is no data to support proceeding with systemic chemotherapy beyond 6 cycles at this point. He was also advised to call immediately if he has any other concerning symptoms in the interval. The total time spent in the appointment was 35 minutes. Disclaimer: This note was dictated with voice recognition software. Similar sounding words can inadvertently be transcribed and may be  missed upon review.  Eilleen Kempf, MD 10/27/21

## 2021-10-26 ENCOUNTER — Other Ambulatory Visit: Payer: Self-pay | Admitting: Internal Medicine

## 2021-10-26 DIAGNOSIS — C3412 Malignant neoplasm of upper lobe, left bronchus or lung: Secondary | ICD-10-CM

## 2021-10-27 ENCOUNTER — Inpatient Hospital Stay: Payer: 59 | Attending: Internal Medicine

## 2021-10-27 ENCOUNTER — Inpatient Hospital Stay: Payer: 59

## 2021-10-27 ENCOUNTER — Other Ambulatory Visit: Payer: Self-pay

## 2021-10-27 ENCOUNTER — Encounter: Payer: Self-pay | Admitting: Internal Medicine

## 2021-10-27 ENCOUNTER — Inpatient Hospital Stay (HOSPITAL_BASED_OUTPATIENT_CLINIC_OR_DEPARTMENT_OTHER): Payer: 59 | Admitting: Physician Assistant

## 2021-10-27 VITALS — BP 104/79 | HR 104 | Temp 97.1°F | Resp 16 | Wt 145.4 lb

## 2021-10-27 DIAGNOSIS — Z79899 Other long term (current) drug therapy: Secondary | ICD-10-CM | POA: Diagnosis not present

## 2021-10-27 DIAGNOSIS — Z7982 Long term (current) use of aspirin: Secondary | ICD-10-CM | POA: Diagnosis not present

## 2021-10-27 DIAGNOSIS — C781 Secondary malignant neoplasm of mediastinum: Secondary | ICD-10-CM | POA: Diagnosis not present

## 2021-10-27 DIAGNOSIS — Z9221 Personal history of antineoplastic chemotherapy: Secondary | ICD-10-CM | POA: Insufficient documentation

## 2021-10-27 DIAGNOSIS — Z923 Personal history of irradiation: Secondary | ICD-10-CM | POA: Insufficient documentation

## 2021-10-27 DIAGNOSIS — C3412 Malignant neoplasm of upper lobe, left bronchus or lung: Secondary | ICD-10-CM | POA: Diagnosis not present

## 2021-10-27 DIAGNOSIS — F17210 Nicotine dependence, cigarettes, uncomplicated: Secondary | ICD-10-CM | POA: Insufficient documentation

## 2021-10-27 DIAGNOSIS — Z95828 Presence of other vascular implants and grafts: Secondary | ICD-10-CM

## 2021-10-27 DIAGNOSIS — C7931 Secondary malignant neoplasm of brain: Secondary | ICD-10-CM | POA: Diagnosis present

## 2021-10-27 LAB — CMP (CANCER CENTER ONLY)
ALT: 30 U/L (ref 0–44)
AST: 32 U/L (ref 15–41)
Albumin: 3.8 g/dL (ref 3.5–5.0)
Alkaline Phosphatase: 92 U/L (ref 38–126)
Anion gap: 8 (ref 5–15)
BUN: 13 mg/dL (ref 6–20)
CO2: 27 mmol/L (ref 22–32)
Calcium: 9.1 mg/dL (ref 8.9–10.3)
Chloride: 105 mmol/L (ref 98–111)
Creatinine: 0.88 mg/dL (ref 0.61–1.24)
GFR, Estimated: >60 mL/min (ref >60)
Glucose, Bld: 112 mg/dL — ABNORMAL HIGH (ref 70–99)
Potassium: 4.1 mmol/L (ref 3.5–5.1)
Sodium: 140 mmol/L (ref 135–145)
Total Bilirubin: 0.3 mg/dL (ref 0.3–1.2)
Total Protein: 7.2 g/dL (ref 6.5–8.1)

## 2021-10-27 LAB — CBC WITH DIFFERENTIAL (CANCER CENTER ONLY)
Abs Immature Granulocytes: 0.02 10*3/uL (ref 0.00–0.07)
Basophils Absolute: 0 10*3/uL (ref 0.0–0.1)
Basophils Relative: 1 %
Eosinophils Absolute: 0.1 10*3/uL (ref 0.0–0.5)
Eosinophils Relative: 6 %
HCT: 25.8 % — ABNORMAL LOW (ref 39.0–52.0)
Hemoglobin: 9.1 g/dL — ABNORMAL LOW (ref 13.0–17.0)
Immature Granulocytes: 1 %
Lymphocytes Relative: 18 %
Lymphs Abs: 0.3 10*3/uL — ABNORMAL LOW (ref 0.7–4.0)
MCH: 33.3 pg (ref 26.0–34.0)
MCHC: 35.3 g/dL (ref 30.0–36.0)
MCV: 94.5 fL (ref 80.0–100.0)
Monocytes Absolute: 0.5 10*3/uL (ref 0.1–1.0)
Monocytes Relative: 27 %
Neutro Abs: 0.9 10*3/uL — ABNORMAL LOW (ref 1.7–7.7)
Neutrophils Relative %: 47 %
Platelet Count: 166 10*3/uL (ref 150–400)
RBC: 2.73 MIL/uL — ABNORMAL LOW (ref 4.22–5.81)
RDW: 18.1 % — ABNORMAL HIGH (ref 11.5–15.5)
WBC Count: 1.8 10*3/uL — ABNORMAL LOW (ref 4.0–10.5)
nRBC: 0 % (ref 0.0–0.2)

## 2021-10-27 LAB — SAMPLE TO BLOOD BANK

## 2021-10-27 MED ORDER — SODIUM CHLORIDE 0.9% FLUSH
10.0000 mL | Freq: Once | INTRAVENOUS | Status: AC
  Administered 2021-10-27: 10 mL

## 2021-10-27 MED ORDER — HEPARIN SOD (PORK) LOCK FLUSH 100 UNIT/ML IV SOLN
500.0000 [IU] | Freq: Once | INTRAVENOUS | Status: AC
  Administered 2021-10-27: 500 [IU]

## 2021-10-28 ENCOUNTER — Ambulatory Visit: Payer: 59

## 2021-10-29 ENCOUNTER — Ambulatory Visit: Payer: 59

## 2021-10-31 ENCOUNTER — Telehealth: Payer: Self-pay | Admitting: Internal Medicine

## 2021-10-31 NOTE — Telephone Encounter (Signed)
Sch per 12/6 los, pt aware

## 2021-11-03 ENCOUNTER — Inpatient Hospital Stay: Payer: 59

## 2021-11-06 ENCOUNTER — Other Ambulatory Visit: Payer: Self-pay | Admitting: Radiation Therapy

## 2021-11-06 ENCOUNTER — Other Ambulatory Visit: Payer: Self-pay | Admitting: Internal Medicine

## 2021-11-06 ENCOUNTER — Other Ambulatory Visit: Payer: Self-pay

## 2021-11-06 ENCOUNTER — Ambulatory Visit
Admission: RE | Admit: 2021-11-06 | Discharge: 2021-11-06 | Disposition: A | Discharge status: Home / Self Care | Payer: 59 | Source: Ambulatory Visit | Attending: Radiation Oncology | Admitting: Radiation Oncology

## 2021-11-06 ENCOUNTER — Telehealth: Payer: Self-pay | Admitting: Medical Oncology

## 2021-11-06 DIAGNOSIS — C7931 Secondary malignant neoplasm of brain: Secondary | ICD-10-CM

## 2021-11-06 MED ORDER — HEPARIN SOD (PORK) LOCK FLUSH 100 UNIT/ML IV SOLN
500.0000 [IU] | Freq: Once | INTRAVENOUS | Status: AC
  Administered 2021-11-06: 500 [IU] via INTRAVENOUS

## 2021-11-06 MED ORDER — SODIUM CHLORIDE 0.9% FLUSH
10.0000 mL | INTRAVENOUS | Status: DC | PRN
  Administered 2021-11-06: 10 mL via INTRAVENOUS

## 2021-11-06 MED ORDER — GADOBENATE DIMEGLUMINE 529 MG/ML IV SOLN
13.0000 mL | Freq: Once | INTRAVENOUS | Status: AC | PRN
  Administered 2021-11-06: 13 mL via INTRAVENOUS

## 2021-11-06 NOTE — Telephone Encounter (Signed)
Since Sunday -Intermittent numbness on his bottom lip -right side, and seeing " blue strobe lights" pop up in his R eye .  Today the episodes were more frequent ,every hour and lasted 10 minutes.   He denies any other symptoms-no confusion,  no speech impairment , no balance issues , no other areas of  numbness , swelling ,  pain or weakness.  I asked pt to call back tomorrow with f/u and to call EMS or go to ED if symptoms persist ,become prolonged or he develops progressive numbness and or  worsening visual effects. Pt voiced understanding.  MRI brain done today.

## 2021-11-06 NOTE — Progress Notes (Signed)
Orders placed for port access 11/06/21, brain MRI at [facility].   Mont Dutton R.T.(R)(T) Radiation Special Procedures Navigator

## 2021-11-10 ENCOUNTER — Telehealth: Payer: Self-pay

## 2021-11-10 ENCOUNTER — Inpatient Hospital Stay: Payer: 59

## 2021-11-10 ENCOUNTER — Ambulatory Visit
Admission: RE | Admit: 2021-11-10 | Discharge: 2021-11-10 | Disposition: A | Discharge status: Home / Self Care | Payer: 59 | Source: Ambulatory Visit | Attending: Radiation Oncology | Admitting: Radiation Oncology

## 2021-11-10 ENCOUNTER — Encounter: Payer: Self-pay | Admitting: Radiation Oncology

## 2021-11-10 DIAGNOSIS — C3412 Malignant neoplasm of upper lobe, left bronchus or lung: Secondary | ICD-10-CM

## 2021-11-10 NOTE — Telephone Encounter (Signed)
Left message for patient in reference to patient's 3:00pm-11/10/21 TELEPHONE only appointment  w/ Shona Simpson PA-C w/ regard to completing the "nursing portion" of the appointment. I left my extension [phone] for patient to return call prior to appointment time.

## 2021-11-10 NOTE — Progress Notes (Addendum)
Radiation Oncology         ([phone]) [phone] ________________________________  Outpatient Reconsultation - Conducted via telephone due to current COVID-19 concerns for limiting patient exposure  I spoke with the patient to conduct this consult visit via telephone to spare the patient unnecessary potential exposure in the healthcare setting during the current COVID-19 pandemic. The patient was notified in advance and was offered a [place] meeting to allow for face to face communication but unfortunately reported that they did not have the appropriate resources/technology to support such a visit and instead preferred to proceed with a telephone visit.  Name: Richard Santos        MRN: [id number]  Date of Service: 11/10/2021 DOB: 1961-09-25  GU:RKYH, Hunt Oris, MD  [name], MD     REFERRING PHYSICIAN: [name], MD   DIAGNOSIS: The encounter diagnosis was Small cell lung cancer, left upper lobe ([place]).   HISTORY OF PRESENT ILLNESS: Richard Santos is a 60 y.o. male  with a history of extensive stage small cell lung cancer.  The patient had a history of prostate cancer that was treated in 2016 with radioactive seed placement.  He was diagnosed with small cell carcinoma in May 2021 and received chemoradiation in the summer 2021, during his work-up he did have a 6 mm lesion in the brain along the cerebellum, however with repeat imaging on 08/03/2020, this was felt to be rather a subacute infarct and measured 4 mm.  He was counseled following the course of chemoradiation on prophylactic cranial irradiation and received this regimen in October 2021.  Of note he was unable to tolerate Namenda. Over the summer of 2022 he was found to have disease in the left adrenal gland and his surveillance brain MRI on 05/15/21  showed 5 new lesions in the brain the largest was 9 mm in the left temporoparietal lobe with edema. The other lesions were 4 mm in the right temporal lobe, 5 mm in the right lateral basal  ganglia/external capsule, 3.5 mm left frontal lobe, and 3 mm left head of caudate. The prior area described in the past as a subacute infarct was read as a treated lesion without recurrence.   He proceeded with salvage SRS on 05/30/21 and also completed palliative radiotherapy to the left adrenal gland on 06/09/21. 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 He was able to complete a total of 6 cycles of chemotherapy on 10/08/21. He will be followed in surveillance with repeat imaging in February 2023. He had a repeat MRI of the brain on 11/06/21 that showed numerous small enhancing lesions. There were features consistent in some of these appearing like infarcts. He is contacted by phone to review these results.     PREVIOUS RADIATION THERAPY: Yes   05/30/21 SRS Treatment: The Following sites were treated with Salvage SRS to 20 Gy each in a single fraction. PTV_1TemporalR PTV_2TemporR PTV_3GangliaR PTV_4FrontR  PTV_5CaudateL  05/15/21-06/09/21:  The patient received 37.5 Gy in a palliative radiotherapy course over 15 fractions to the left adrenal gland.  08/26/20-09/06/20 Prophylactic Cranial Irradiation (PCI)Treatment: The patient's whole brain was treated to 25 Gy in 10 fractions    04/25/20 - 05/15/20: The patient was treated to the disease within the left lung initially to a dose of 37.5 Gy in 15 fractions using a 3 field, 3-D conformal technique.    2016:  The prostate was treated with radioactive seeds with the following prescription: Prostate 14,500  cGy. Isotope: I-125 utilizing 79 seeds and 29 active needles.  Individual seed activity 0.47 mCi per seed for a total implant activity of 37.13 mCi.  PAST MEDICAL HISTORY:  Past Medical History:  Diagnosis Date   Allergic rhinitis    Allergy    Anxiety    Chronic low back pain    DDD (degenerative disc disease), lumbar    ED (erectile dysfunction)    Finger injury    cut pads off 3rd and 4th  finger left hand/due to lawn mower accident   GERD (gastroesophageal reflux disease)    Heart murmur    as child only-    History of kidney stones    HTN (hypertension) 08/20/2016   Hyperlipidemia    Incomplete right bundle branch block    Prostate cancer ([place]) dx 10/02/14   stage T1c   Sigmoid diverticulosis    Small cell lung cancer ([place]) 03/2020   Wears glasses        PAST SURGICAL HISTORY: Past Surgical History:  Procedure Laterality Date   BRONCHIAL BIOPSY  04/12/2020   Procedure: BRONCHIAL BIOPSIES;  Surgeon: Marshell Garfinkel, MD;  Location: [facility];  Service: Cardiopulmonary;;   BRONCHIAL BRUSHINGS  04/12/2020   Procedure: BRONCHIAL BRUSHINGS;  Surgeon: Marshell Garfinkel, MD;  Location: [facility];  Service: Cardiopulmonary;;   BRONCHIAL NEEDLE ASPIRATION BIOPSY  04/12/2020   Procedure: BRONCHIAL NEEDLE ASPIRATION BIOPSIES;  Surgeon: Marshell Garfinkel, MD;  Location: [facility];  Service: Cardiopulmonary;;   BRONCHIAL WASHINGS  04/12/2020   Procedure: BRONCHIAL WASHINGS;  Surgeon: Marshell Garfinkel, MD;  Location: [facility];  Service: Cardiopulmonary;;   COLONOSCOPY     COLONOSCOPY W/ POLYPECTOMY  04-19-2012   ENDOBRONCHIAL ULTRASOUND N/A 04/12/2020   Procedure: ENDOBRONCHIAL ULTRASOUND;  Surgeon: Marshell Garfinkel, MD;  Location: [facility];  Service: Cardiopulmonary;  Laterality: N/A;   ESOPHAGOGASTRODUODENOSCOPY  08-05-2010   HEMOSTASIS CONTROL  04/12/2020   Procedure: HEMOSTASIS CONTROL;  Surgeon: Marshell Garfinkel, MD;  Location: [facility];  Service: Cardiopulmonary;;   IR IMAGING GUIDED PORT INSERTION  05/31/2020   POLYPECTOMY     PROSTATE BIOPSY  10/02/14   RADIOACTIVE SEED IMPLANT N/A 01/30/2015   Procedure: RADIOACTIVE SEED IMPLANT;  Surgeon: Rana Snare, MD;  Location[facility];  Service: Urology;  Laterality: N/A;   UPPER GASTROINTESTINAL ENDOSCOPY  04/03/2021   VIDEO BRONCHOSCOPY N/A 04/12/2020   Procedure: VIDEO BRONCHOSCOPY WITHOUT FLUORO;   Surgeon: Marshell Garfinkel, MD;  Location: [facility];  Service: Cardiopulmonary;  Laterality: N/A;     FAMILY HISTORY:  Family History  Problem Relation Age of Onset   Prostate cancer Father    Heart disease Father    Heart disease Mother    Breast cancer Sister    Heart disease Other    Colon cancer Neg Hx    Colon polyps Neg Hx    Esophageal cancer Neg Hx    Rectal cancer Neg Hx    Stomach cancer Neg Hx      SOCIAL HISTORY:  reports that he has been smoking cigarettes. He has a 12.50 pack-year smoking history. He has never used smokeless tobacco. He reports current alcohol use of about 49.0 standard drinks per week. He reports that he does not use drugs. The patient is married and lives in [place]. 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   ALLERGIES: Bee venom, Elemental sulfur, Other, Sulfa antibiotics, and Namenda [memantine]   MEDICATIONS:  Current Outpatient Medications  Medication Sig Dispense Refill   aspirin EC  81 MG tablet Take 81 mg by mouth daily. Swallow whole.     Budeson-Glycopyrrol-Formoterol (BREZTRI AEROSPHERE) 160-9-4.8 MCG/ACT AERO Inhale 2 puffs into the lungs in the morning and at bedtime. 10.7 g 11   busPIRone (BUSPAR) 10 MG tablet Take 10 mg by mouth 2 (two) times daily.     famotidine (PEPCID) 20 MG tablet Take 1 tablet (20 mg total) by mouth 2 (two) times daily. 60 tablet 2   lovastatin (MEVACOR) 20 MG tablet Take 1 tablet (20 mg total) by mouth at bedtime. 90 tablet 3   Multiple Vitamin (MULTIVITAMIN) capsule Take 1 capsule by mouth daily.     ondansetron (ZOFRAN-ODT) 8 MG disintegrating tablet TAKE 1 TABLET BY MOUTH EVERY 8 HOURS AS NEEDED FOR NAUSEA OR VOMITING. 30 tablet 2   pantoprazole (PROTONIX) 40 MG tablet Take 40 mg by mouth every morning.     prochlorperazine (COMPAZINE) 10 MG tablet TAKE 1 TABLET BY MOUTH EVERY 6 HOURS AS NEEDED 30 tablet 2   sodium bicarbonate 650 MG tablet TAKE 1 TABLET BY MOUTH TWICE A DAY 60 tablet 1    traMADol (ULTRAM) 50 MG tablet TAKE 1/2 TABLET BY MOUTH TWICE A DAY AS NEEDED FOR PAIN 60 tablet 2   No current facility-administered medications for this encounter.   Facility-Administered Medications Ordered in Other Encounters  Medication Dose Route Frequency Provider Last Rate Last Admin   heparin lock flush 100 unit/mL  500 Units Intracatheter Daily PRN Curt Bears, MD       sodium chloride flush (NS) 0.9 % injection 10 mL  10 mL Intracatheter PRN Curt Bears, MD       sodium chloride flush (NS) 0.9 % injection 10 mL  10 mL Intracatheter PRN Curt Bears, MD   10 mL at 07/16/21 1606     REVIEW OF SYSTEMS:   On review of systems the patient reports that he is doing okay.  He is still very tired.  He continues to have tinnitus and muffled hearing despite the Medrol Dosepak that we gave him at his last visit.  He reports more concerning that started approximately last Sunday or Monday have been numbness of his right face.  Initially this started in his lower lip and has tracked cephalad into his right cheek and jaw.  He states that his right thigh has also had some changes in its perception of light and that he has intermittent episodes of flashing blue and yellow light.  He states that he was even concerned about driving and thought to go to the emergency room but then considered that since he had just had his MRI that it would probably be a waste of his time and money.  He has not had any loss of consciousness, headaches, other visual disturbances.  He is not had any unilateral weakness.  He has not had any slurred speech or facial asymmetry.  No other complaints are verbalized.  PHYSICAL EXAM:   Unable to assess due to encounter type.   ECOG = 1  0 - Asymptomatic (Fully active, able to carry on all predisease activities without restriction)  1 - Symptomatic but completely ambulatory (Restricted in physically strenuous activity but ambulatory and able to carry out work of a  light or sedentary nature. For example, light housework, office work)  2 - Symptomatic, <50% in bed during the day (Ambulatory and capable of all self care but unable to carry out any work activities. Up and about more than 50% of waking hours)  3 - Symptomatic, >50% in bed, but not bedbound (Capable of only limited self-care, confined to bed or chair 50% or more of waking hours)  4 - Bedbound (Completely disabled. Cannot carry on any self-care. Totally confined to bed or chair)  5 - Death   Eustace Pen MM, Creech RH, Tormey DC, et al. [phone]). "Toxicity and response criteria of the [facility]". [place] Oncol. 5 (6): 649-55    LABORATORY DATA:  Lab Results  Component Value Date   WBC 1.8 (L) 10/27/2021   HGB 9.1 (L) 10/27/2021   HCT 25.8 (L) 10/27/2021   MCV 94.5 10/27/2021   PLT 166 10/27/2021   Lab Results  Component Value Date   NA 140 10/27/2021   K 4.1 10/27/2021   CL 105 10/27/2021   CO2 27 10/27/2021   Lab Results  Component Value Date   ALT 30 10/27/2021   AST 32 10/27/2021   ALKPHOS 92 10/27/2021   BILITOT 0.3 10/27/2021      RADIOGRAPHY: CT Chest W Contrast  Result Date: 10/21/2021 CLINICAL DATA:  60 year old male with history of small cell lung cancer. Restaging examination. EXAM: CT CHEST, ABDOMEN, AND PELVIS WITH CONTRAST TECHNIQUE: Multidetector CT imaging of the chest, abdomen and pelvis was performed following the standard protocol during bolus administration of intravenous contrast. CONTRAST:  17mL OMNIPAQUE IOHEXOL 350 MG/ML SOLN COMPARISON:  Multiple priors, most recently CT the chest, abdomen and pelvis 09/12/2021. FINDINGS: CT CHEST FINDINGS Cardiovascular: Heart size is normal. There is no significant pericardial fluid, thickening or pericardial calcification. There is aortic atherosclerosis, as well as atherosclerosis of the great vessels of the mediastinum and the coronary arteries, including calcified atherosclerotic plaque in  the left anterior descending, left circumflex and right coronary arteries. Right internal jugular single-lumen porta cath with tip terminating at the superior cavoatrial junction. Mediastinum/Nodes: No pathologically enlarged mediastinal or hilar lymph nodes. Esophagus is unremarkable in appearance. No axillary lymphadenopathy. Lungs/Pleura: Again noted is a thick-walled cavitary lesion in the left upper lobe (axial image 40 of series 4) which currently measures 2.3 x 1.4 cm (unchanged) at the site of the previously treated left upper lobe lesion. Previously noted area of nodularity cephalad to this has resolved compared to the prior examination. There continues to be extensive architectural distortion and volume loss in the medial aspect of the left upper and lower lobes where there is also extensive traction bronchiectasis, similar to the prior study, most compatible with an area of chronic postradiation mass-like fibrosis. No definite new suspicious appearing pulmonary nodules or masses are noted. No acute consolidative airspace disease. No pleural effusions. Musculoskeletal: Sclerotic lesion in the superior aspect of the sternum and sclerotic lesions in the T8 vertebra, similar to the prior study. Additional sclerotic lesions in the posterior elements of T6 and 7, similar in retrospect to the prior examination. No definite new osseous lesions. Old healed fracture of the anterior aspect of the left second rib incidentally noted. CT ABDOMEN PELVIS FINDINGS Hepatobiliary: No suspicious appearing cystic or solid hepatic lesions. No intra or extrahepatic biliary ductal dilatation. Gallbladder is normal in appearance. Pancreas: No pancreatic mass. No pancreatic ductal dilatation. No pancreatic or peripancreatic fluid collections or inflammatory changes. Spleen: Unremarkable. Adrenals/Urinary Tract: 2.4 cm low-attenuation lesion in the interpolar region of the right kidney is compatible with a simple cyst. Left kidney  and bilateral adrenal glands are normal in appearance. No hydroureteronephrosis. Urinary bladder is nearly completely decompressed, but otherwise unremarkable in appearance. Stomach/Bowel: The appearance of  the stomach is normal. No pathologic dilatation of small bowel or colon. Normal appendix. Vascular/Lymphatic: Aortic atherosclerosis, without evidence of aneurysm or dissection in the abdominal or pelvic vasculature. No lymphadenopathy noted in the abdomen or pelvis. Reproductive: Brachytherapy implants throughout the prostate gland. Other: No significant volume of ascites.  No pneumoperitoneum. Musculoskeletal: 1.1 cm sclerotic lesion in the right proximal femur (axial image 132 of series 2), and 1.3 cm sclerotic lesion in the left femoral head (axial image 123 of series 2) both similar to the prior examination. No new suspicious appearing osseous lesions. IMPRESSION: 1. Stable appearance of treated lesion in the left upper lobe with resolution of adjacent satellite left upper lobe nodularity. No evidence of recurrence of treated left adrenal lesion. Sclerotic lesions throughout the visualized axial and appendicular skeleton appear similar to the prior examination. No new signs of metastatic disease noted elsewhere in the chest, abdomen or pelvis. 2. Aortic atherosclerosis, in addition to three-vessel coronary artery disease. Please note that although the presence of coronary artery calcium documents the presence of coronary artery disease, the severity of this disease and any potential stenosis cannot be assessed on this non-gated CT examination. Assessment for potential risk factor modification, dietary therapy or pharmacologic therapy may be warranted, if clinically indicated. 3. Additional incidental findings, similar to the prior study, as above. Electronically Signed   By: Vinnie Langton M.D.   On: 10/21/2021 07:15   MR Brain W Wo Contrast  Result Date: 11/08/2021 CLINICAL DATA:  Brain/CNS neoplasm,  monitor EXAM: MRI HEAD WITHOUT AND WITH CONTRAST TECHNIQUE: Multiplanar, multiecho pulse sequences of the brain and surrounding structures were obtained without and with intravenous contrast. CONTRAST:  53mL MULTIHANCE GADOBENATE DIMEGLUMINE 529 MG/ML IV SOLN COMPARISON:  09/05/2021 FINDINGS: Brain: There are multiple new small enhancing lesions on series 11 with some examples as follows: Right temporal lobe images 65, 70, 76; left temporal on image 60; right frontal lobe (images 125 and 129); left frontal lobe images 117, 123, 142); left parietal lobe image 110); left parietotemporal on image 84; left occipital on image 58; right occipital on image 53; white matter tracts about the lentiform nuclei bilaterally; posterior left thalamus on image 83; cerebellum and pons (images 27, 46, 49). Small foci of cortical diffusion hyperintensity are present in the frontoparietal lobes bilaterally. A few of these do correspond to areas of enhancement above. Stable curvilinear enhancement of left occipitotemporal lesion (series 11, image 51). Stable punctate right external capsule lesion (image 95). Scattered small foci of T2 hyperintensity in the supratentorial white matter may reflect superimposed chronic microvascular ischemic changes. Foci of susceptibility again identified at the site of treated left inferior frontal and cerebellar lesions. There is no mass effect. Ventricles and sulci are stable in size and configuration. Vascular: Major vessel flow voids at the skull base are preserved. Skull and upper cervical spine: Normal marrow signal is preserved. Sinuses/Orbits: Paranasal sinuses are aerated. Orbits are unremarkable. Other: Sella is unremarkable. Persistent patchy bilateral mastoid effusions. IMPRESSION: Numerous new small enhancing lesions. There is no substantial edema but these are small in size and could reflect metastatic lesions. Given that some of these are cortical and demonstrate corresponding reduced  diffusion, at least a subset may reflect subacute infarcts. Previously treated lesions are stable. Electronically Signed   By: Macy Mis M.D.   On: 11/08/2021 13:22   CT Abdomen Pelvis W Contrast  Result Date: 10/21/2021 CLINICAL DATA:  60 year old male with history of small cell lung cancer. Restaging examination. EXAM: CT  CHEST, ABDOMEN, AND PELVIS WITH CONTRAST TECHNIQUE: Multidetector CT imaging of the chest, abdomen and pelvis was performed following the standard protocol during bolus administration of intravenous contrast. CONTRAST:  29mL OMNIPAQUE IOHEXOL 350 MG/ML SOLN COMPARISON:  Multiple priors, most recently CT the chest, abdomen and pelvis 09/12/2021. FINDINGS: CT CHEST FINDINGS Cardiovascular: Heart size is normal. There is no significant pericardial fluid, thickening or pericardial calcification. There is aortic atherosclerosis, as well as atherosclerosis of the great vessels of the mediastinum and the coronary arteries, including calcified atherosclerotic plaque in the left anterior descending, left circumflex and right coronary arteries. Right internal jugular single-lumen porta cath with tip terminating at the superior cavoatrial junction. Mediastinum/Nodes: No pathologically enlarged mediastinal or hilar lymph nodes. Esophagus is unremarkable in appearance. No axillary lymphadenopathy. Lungs/Pleura: Again noted is a thick-walled cavitary lesion in the left upper lobe (axial image 40 of series 4) which currently measures 2.3 x 1.4 cm (unchanged) at the site of the previously treated left upper lobe lesion. Previously noted area of nodularity cephalad to this has resolved compared to the prior examination. There continues to be extensive architectural distortion and volume loss in the medial aspect of the left upper and lower lobes where there is also extensive traction bronchiectasis, similar to the prior study, most compatible with an area of chronic postradiation mass-like fibrosis. No  definite new suspicious appearing pulmonary nodules or masses are noted. No acute consolidative airspace disease. No pleural effusions. Musculoskeletal: Sclerotic lesion in the superior aspect of the sternum and sclerotic lesions in the T8 vertebra, similar to the prior study. Additional sclerotic lesions in the posterior elements of T6 and 7, similar in retrospect to the prior examination. No definite new osseous lesions. Old healed fracture of the anterior aspect of the left second rib incidentally noted. CT ABDOMEN PELVIS FINDINGS Hepatobiliary: No suspicious appearing cystic or solid hepatic lesions. No intra or extrahepatic biliary ductal dilatation. Gallbladder is normal in appearance. Pancreas: No pancreatic mass. No pancreatic ductal dilatation. No pancreatic or peripancreatic fluid collections or inflammatory changes. Spleen: Unremarkable. Adrenals/Urinary Tract: 2.4 cm low-attenuation lesion in the interpolar region of the right kidney is compatible with a simple cyst. Left kidney and bilateral adrenal glands are normal in appearance. No hydroureteronephrosis. Urinary bladder is nearly completely decompressed, but otherwise unremarkable in appearance. Stomach/Bowel: The appearance of the stomach is normal. No pathologic dilatation of small bowel or colon. Normal appendix. Vascular/Lymphatic: Aortic atherosclerosis, without evidence of aneurysm or dissection in the abdominal or pelvic vasculature. No lymphadenopathy noted in the abdomen or pelvis. Reproductive: Brachytherapy implants throughout the prostate gland. Other: No significant volume of ascites.  No pneumoperitoneum. Musculoskeletal: 1.1 cm sclerotic lesion in the right proximal femur (axial image 132 of series 2), and 1.3 cm sclerotic lesion in the left femoral head (axial image 123 of series 2) both similar to the prior examination. No new suspicious appearing osseous lesions. IMPRESSION: 1. Stable appearance of treated lesion in the left upper  lobe with resolution of adjacent satellite left upper lobe nodularity. No evidence of recurrence of treated left adrenal lesion. Sclerotic lesions throughout the visualized axial and appendicular skeleton appear similar to the prior examination. No new signs of metastatic disease noted elsewhere in the chest, abdomen or pelvis. 2. Aortic atherosclerosis, in addition to three-vessel coronary artery disease. Please note that although the presence of coronary artery calcium documents the presence of coronary artery disease, the severity of this disease and any potential stenosis cannot be assessed on this non-gated CT examination.  Assessment for potential risk factor modification, dietary therapy or pharmacologic therapy may be warranted, if clinically indicated. 3. Additional incidental findings, similar to the prior study, as above. Electronically Signed   By: Vinnie Langton M.D.   On: 10/21/2021 07:15        IMPRESSION/PLAN: 1. Extensive stage small cell lung cancer arising in the left lung.  His case was discussed this morning and brain pathology conference.  He has numerous enhancing lesions that could either be metastatic versus subacute infarcts.  It was difficult for neurooncology to isolate out the difference because of these being so small, and the recommendation was to repeat another MRI scan in 4 to 6 weeks.  He is comfortable with this plan.  Given his neurologic symptoms however and some progression through the week last week, he does desire evaluation.  We will reach out to Dr. Mickeal Skinner to see if he will follow-up with the patient regarding the symptoms this week or next.  The patient's insurance is changing at the end of next week.  He will otherwise continue to be followed in surveillance with medical oncology as well.  He knows to go to the emergency room if he has abrupt progressive or new onset of neurologic symptoms which we reviewed. 2.  Bilateral mastoid effusions and tinnitus. He did not  have improvement in his symptoms or effusions radiographically despite medrol dose pack. He is interested in seeing how he is doing in a few months with this before referral to ENT given his insurance is changing this month.  Given current concerns for patient exposure during the COVID-19 pandemic, this encounter was conducted via telephone.  The patient has provided two factor identification and has given verbal consent for this type of encounter and has been advised to only accept a meeting of this type in a secure network environment. The time spent during this encounter was 45 minutes including preparation, discussion, and coordination of the patient's care. The attendants for this meeting include   [place] Pedro  and [name]. During the encounter  Hayden Pedro was located at [facility] Radiation Oncology Department.  Richard Santos was located at home.    Carola Rhine, PAC

## 2021-11-10 NOTE — Progress Notes (Signed)
Patient reports doing well. No pain but mild shortness of breath reported. Otherwise no other symptoms reported at this time. Meaningful use complete.  Patient notified of 3:00pm-11/10/21 TELEPHONE only appointment w/ Shona Simpson PA-C and visit has been completed.  Patient preferred contact # [phone]

## 2021-11-12 ENCOUNTER — Other Ambulatory Visit: Payer: Self-pay | Admitting: Radiation Therapy

## 2021-11-12 DIAGNOSIS — C7931 Secondary malignant neoplasm of brain: Secondary | ICD-10-CM

## 2021-11-12 NOTE — Progress Notes (Signed)
Order placed for port access on 1/26 at [facility] for brain MRI contrast.   Mont Dutton R.T.(R)(T) Radiation Special Procedures Navigator

## 2021-11-13 ENCOUNTER — Inpatient Hospital Stay (HOSPITAL_BASED_OUTPATIENT_CLINIC_OR_DEPARTMENT_OTHER): Payer: 59 | Admitting: Internal Medicine

## 2021-11-13 ENCOUNTER — Other Ambulatory Visit: Payer: Self-pay

## 2021-11-13 VITALS — BP 110/75 | HR 111 | Temp 98.1°F | Resp 18 | Wt 145.5 lb

## 2021-11-13 DIAGNOSIS — H5347 Heteronymous bilateral field defects: Secondary | ICD-10-CM | POA: Diagnosis not present

## 2021-11-13 DIAGNOSIS — C7931 Secondary malignant neoplasm of brain: Secondary | ICD-10-CM

## 2021-11-13 DIAGNOSIS — C3412 Malignant neoplasm of upper lobe, left bronchus or lung: Secondary | ICD-10-CM | POA: Diagnosis not present

## 2021-11-13 MED ORDER — LEVETIRACETAM 1000 MG PO TABS: 1000.0000 mg | ORAL_TABLET | Freq: Two times a day (BID) | ORAL | 1 refills | Status: DC

## 2021-11-13 NOTE — Progress Notes (Signed)
[facility] [place] at [place], [place] [address] [phone]   New Patient Evaluation  Date of Service: 11/13/21 Patient Name: DEANGELO BERNS Patient MRN: [id number] Patient DOB: January 31, 1961 Provider: Ventura Sellers, MD  Identifying Statement:  JAMIESON HETLAND is a 60 y.o. male with Metastatic cancer to brain [facility]) who presents for initial consultation and evaluation regarding cancer associated neurologic deficits.    Referring Provider: [name], MD [address] [place],  [place] [address]  Primary Cancer:  Oncologic History: Oncology History  Small cell lung cancer, left upper lobe ([place])  04/18/2020 Initial Diagnosis   Small cell lung cancer, left upper lobe ([place])   05/06/2020 - 05/29/2021 Chemotherapy          12/25/2020 Cancer Staging   Staging form: Lung, AJCC 8th Edition - Clinical: Stage IVB (cT2b, cN2, cM1c) - Signed by Curt Bears, MD on 12/25/2020    06/24/2021 -  Chemotherapy   Patient is on Treatment Plan : LUNG SMALL CELL Carboplatin D1 / Etoposide D1-3 q21d      CNS Oncologic History 09/11/20: Completes PCI irradiation 05/30/21: Salvage SRS x5 Lisbeth Renshaw)  History of Present Illness: The patient's records from the referring physician were obtained and reviewed and the patient interviewed to confirm this HPI.  DELBERT DARLEY presents today to discuss new neurologic symptoms.  He describes 3 weeks history of episodes of "flashing blue and yellow lights" in the right side of his vision, lasting for a few minutes.  Episodes do persist when right eye is closed, or at least appear to.  Frequency is "multiple times per day, maybe even once per hour".  He had one episode of confusion which may have followed one of the events.  Also describes feeling of numbness affecting right side of mouth and lower face.  These may be associated with the visual symptoms, onset was the same time period.  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  Recent MRI demonstrates progressive findings, was reviewed earlier this week with radiation oncology.  Medications: Current Outpatient Medications on File Prior to Visit  Medication Sig Dispense Refill   aspirin EC 81 MG tablet Take 81 mg by mouth daily. Swallow whole.     Budeson-Glycopyrrol-Formoterol (BREZTRI AEROSPHERE) 160-9-4.8 MCG/ACT AERO Inhale 2 puffs into the lungs in the morning and at bedtime. 10.7 g 11   busPIRone (BUSPAR) 10 MG tablet Take 10 mg by mouth 2 (two) times daily.     famotidine (PEPCID) 20 MG tablet Take 1 tablet (20 mg total) by mouth 2 (two) times daily. 60 tablet 2   lovastatin (MEVACOR) 20 MG tablet Take 1 tablet (20 mg total) by mouth at bedtime. 90 tablet 3   Multiple Vitamin (MULTIVITAMIN) capsule Take 1 capsule by mouth daily.     ondansetron (ZOFRAN-ODT) 8 MG disintegrating tablet TAKE 1 TABLET BY MOUTH EVERY 8 HOURS AS NEEDED FOR NAUSEA OR VOMITING. 30 tablet 2   pantoprazole (PROTONIX) 40 MG tablet Take 40 mg by mouth every morning.     prochlorperazine (COMPAZINE) 10 MG tablet TAKE 1 TABLET BY MOUTH EVERY 6 HOURS AS NEEDED 30 tablet 2   sodium bicarbonate 650 MG tablet TAKE 1 TABLET BY MOUTH TWICE A DAY 60 tablet 1   traMADol (ULTRAM) 50 MG tablet TAKE 1/2 TABLET BY MOUTH TWICE A DAY AS NEEDED FOR PAIN 60 tablet 2   Current Facility-Administered Medications on  File Prior to Visit  Medication Dose Route Frequency Provider Last Rate Last Admin   heparin lock flush 100 unit/mL  500 Units Intracatheter Daily PRN Curt Bears, MD       sodium chloride flush (NS) 0.9 % injection 10 mL  10 mL Intracatheter PRN Curt Bears, MD       sodium chloride flush (NS) 0.9 % injection 10 mL  10 mL Intracatheter PRN Curt Bears, MD   10 mL at 07/16/21 1606    Allergies:  Allergies  Allergen Reactions   Bee Venom Swelling   Elemental Sulfur Other (See Comments)    Acute renal failure   Other Other (See  Comments)    Chemo medicine- patient not sure about name - swelling & rashes   Sulfa Antibiotics    Namenda [Memantine] Nausea Only    Nausea and fatigue   Past Medical History:  Past Medical History:  Diagnosis Date   Allergic rhinitis    Allergy    Anxiety    Chronic low back pain    DDD (degenerative disc disease), lumbar    ED (erectile dysfunction)    Finger injury    cut pads off 3rd and 4th finger left hand/due to lawn mower accident   GERD (gastroesophageal reflux disease)    Heart murmur    as child only-    History of kidney stones    HTN (hypertension) 08/20/2016   Hyperlipidemia    Incomplete right bundle branch block    Prostate cancer ([place]) dx 10/02/14   stage T1c   Sigmoid diverticulosis    Small cell lung cancer ([place]) 03/2020   Wears glasses    Past Surgical History:  Past Surgical History:  Procedure Laterality Date   BRONCHIAL BIOPSY  04/12/2020   Procedure: BRONCHIAL BIOPSIES;  Surgeon: Marshell Garfinkel, MD;  Location: [facility];  Service: Cardiopulmonary;;   BRONCHIAL BRUSHINGS  04/12/2020   Procedure: BRONCHIAL BRUSHINGS;  Surgeon: Marshell Garfinkel, MD;  Location: [facility];  Service: Cardiopulmonary;;   BRONCHIAL NEEDLE ASPIRATION BIOPSY  04/12/2020   Procedure: BRONCHIAL NEEDLE ASPIRATION BIOPSIES;  Surgeon: Marshell Garfinkel, MD;  Location: [facility];  Service: Cardiopulmonary;;   BRONCHIAL WASHINGS  04/12/2020   Procedure: BRONCHIAL WASHINGS;  Surgeon: Marshell Garfinkel, MD;  Location: [facility];  Service: Cardiopulmonary;;   COLONOSCOPY     COLONOSCOPY W/ POLYPECTOMY  04-19-2012   ENDOBRONCHIAL ULTRASOUND N/A 04/12/2020   Procedure: ENDOBRONCHIAL ULTRASOUND;  Surgeon: Marshell Garfinkel, MD;  Location: [facility];  Service: Cardiopulmonary;  Laterality: N/A;   ESOPHAGOGASTRODUODENOSCOPY  08-05-2010   HEMOSTASIS CONTROL  04/12/2020   Procedure: HEMOSTASIS CONTROL;  Surgeon: Marshell Garfinkel, MD;  Location: [facility];  Service: Cardiopulmonary;;    IR IMAGING GUIDED PORT INSERTION  05/31/2020   POLYPECTOMY     PROSTATE BIOPSY  10/02/14   RADIOACTIVE SEED IMPLANT N/A 01/30/2015   Procedure: RADIOACTIVE SEED IMPLANT;  Surgeon: Rana Snare, MD;  Location: [facility];  Service: Urology;  Laterality: N/A;   UPPER GASTROINTESTINAL ENDOSCOPY  04/03/2021   VIDEO BRONCHOSCOPY N/A 04/12/2020   Procedure: VIDEO BRONCHOSCOPY WITHOUT FLUORO;  Surgeon: Marshell Garfinkel, MD;  Location: [facility];  Service: Cardiopulmonary;  Laterality: N/A;   Social History:  Social History   Socioeconomic History   Marital status: Married    Spouse name: Not on file   Number of children: 0   Years of education: Not on file   Highest education level: Not on file  Occupational History   Occupation: Public relations account executive  Employer: [employer]  Tobacco Use   Smoking status: Every Day    Packs/day: 0.50    Years: 25.00    Pack years: 12.50    Types: Cigarettes   Smokeless tobacco: Never   Tobacco comments:    smokes 10 cigs per day 08/26/21  Vaping Use   Vaping Use: Former  Substance and Sexual Activity   Alcohol use: Yes    Alcohol/week: 49.0 standard drinks    Types: 25 Cans of beer, 24 Standard drinks or equivalent per week    Comment: 5 beer per day   Drug use: No   Sexual activity: Not on file  Other Topics Concern   Not on file  Social History Narrative   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 He is married with no biological children.   Social Determinants of Health   Financial Resource Strain: Not on file  Food Insecurity: Not on file  Transportation Needs: Not on file  Physical Activity: Not on file  Stress: Not on file  Social Connections: Not on file  Intimate Partner Violence: Not on file   Family History:  Family History  Problem Relation Age of Onset   Prostate cancer Father    Heart disease Father    Heart disease Mother    Breast cancer Sister    Heart disease Other     Colon cancer Neg Hx    Colon polyps Neg Hx    Esophageal cancer Neg Hx    Rectal cancer Neg Hx    Stomach cancer Neg Hx     Review of Systems: Constitutional: Doesn't report fevers, chills or abnormal weight loss Eyes: Doesn't report blurriness of vision Ears, nose, mouth, throat, and face: Doesn't report sore throat Respiratory: Doesn't report cough, dyspnea or wheezes Cardiovascular: Doesn't report palpitation, chest discomfort  Gastrointestinal:  Doesn't report nausea, constipation, diarrhea GU: Doesn't report incontinence Skin: Doesn't report skin rashes Neurological: Per HPI Musculoskeletal: Doesn't report joint pain Behavioral/Psych: Doesn't report anxiety  Physical Exam: Vitals:   11/13/21 1237  BP: 110/75  Pulse: (!) 111  Resp: 18  Temp: 98.1 F (36.7 C)  SpO2: 99%   KPS: 90. General: Alert, cooperative, pleasant, in no acute distress Head: Normal EENT: No conjunctival injection or scleral icterus.  Lungs: Resp effort normal Cardiac: Regular rate Abdomen: Non-distended abdomen Skin: No rashes cyanosis or petechiae. Extremities: No clubbing or edema  Neurologic Exam: Mental Status: Awake, alert, attentive to examiner. Oriented to self and environment. Language is fluent with intact comprehension.  Cranial Nerves: Visual acuity is grossly normal. Visual fields are full. Extra-ocular movements intact. No ptosis. Face is symmetric Motor: Tone and bulk are normal. Power is full in both arms and legs. Reflexes are symmetric, no pathologic reflexes present.  Sensory: Intact to light touch Gait: Normal.   Labs: I have reviewed the data as listed    Component Value Date/Time   NA 140 10/27/2021 0946   K 4.1 10/27/2021 0946   CL 105 10/27/2021 0946   CO2 27 10/27/2021 0946   GLUCOSE 112 (H) 10/27/2021 0946   BUN 13 10/27/2021 0946   CREATININE 0.88 10/27/2021 0946   CALCIUM 9.1 10/27/2021 0946   PROT 7.2 10/27/2021 0946   ALBUMIN 3.8 10/27/2021 0946   AST  32 10/27/2021 0946   ALT 30 10/27/2021 0946   ALKPHOS 92 10/27/2021 0946   BILITOT 0.3 10/27/2021 0946   GFRNONAA >60 10/27/2021 0946   GFRAA >60 08/08/2020 1203  Lab Results  Component Value Date   WBC 1.8 (L) 10/27/2021   NEUTROABS 0.9 (L) 10/27/2021   HGB 9.1 (L) 10/27/2021   HCT 25.8 (L) 10/27/2021   MCV 94.5 10/27/2021   PLT 166 10/27/2021    Imaging:  CT Chest W Contrast  Result Date: 10/21/2021 CLINICAL DATA:  60 year old male with history of small cell lung cancer. Restaging examination. EXAM: CT CHEST, ABDOMEN, AND PELVIS WITH CONTRAST TECHNIQUE: Multidetector CT imaging of the chest, abdomen and pelvis was performed following the standard protocol during bolus administration of intravenous contrast. CONTRAST:  68mL OMNIPAQUE IOHEXOL 350 MG/ML SOLN COMPARISON:  Multiple priors, most recently CT the chest, abdomen and pelvis 09/12/2021. FINDINGS: CT CHEST FINDINGS Cardiovascular: Heart size is normal. There is no significant pericardial fluid, thickening or pericardial calcification. There is aortic atherosclerosis, as well as atherosclerosis of the great vessels of the mediastinum and the coronary arteries, including calcified atherosclerotic plaque in the left anterior descending, left circumflex and right coronary arteries. Right internal jugular single-lumen porta cath with tip terminating at the superior cavoatrial junction. Mediastinum/Nodes: No pathologically enlarged mediastinal or hilar lymph nodes. Esophagus is unremarkable in appearance. No axillary lymphadenopathy. Lungs/Pleura: Again noted is a thick-walled cavitary lesion in the left upper lobe (axial image 40 of series 4) which currently measures 2.3 x 1.4 cm (unchanged) at the site of the previously treated left upper lobe lesion. Previously noted area of nodularity cephalad to this has resolved compared to the prior examination. There continues to be extensive architectural distortion and volume loss in the medial  aspect of the left upper and lower lobes where there is also extensive traction bronchiectasis, similar to the prior study, most compatible with an area of chronic postradiation mass-like fibrosis. No definite new suspicious appearing pulmonary nodules or masses are noted. No acute consolidative airspace disease. No pleural effusions. Musculoskeletal: Sclerotic lesion in the superior aspect of the sternum and sclerotic lesions in the T8 vertebra, similar to the prior study. Additional sclerotic lesions in the posterior elements of T6 and 7, similar in retrospect to the prior examination. No definite new osseous lesions. Old healed fracture of the anterior aspect of the left second rib incidentally noted. CT ABDOMEN PELVIS FINDINGS Hepatobiliary: No suspicious appearing cystic or solid hepatic lesions. No intra or extrahepatic biliary ductal dilatation. Gallbladder is normal in appearance. Pancreas: No pancreatic mass. No pancreatic ductal dilatation. No pancreatic or peripancreatic fluid collections or inflammatory changes. Spleen: Unremarkable. Adrenals/Urinary Tract: 2.4 cm low-attenuation lesion in the interpolar region of the right kidney is compatible with a simple cyst. Left kidney and bilateral adrenal glands are normal in appearance. No hydroureteronephrosis. Urinary bladder is nearly completely decompressed, but otherwise unremarkable in appearance. Stomach/Bowel: The appearance of the stomach is normal. No pathologic dilatation of small bowel or colon. Normal appendix. Vascular/Lymphatic: Aortic atherosclerosis, without evidence of aneurysm or dissection in the abdominal or pelvic vasculature. No lymphadenopathy noted in the abdomen or pelvis. Reproductive: Brachytherapy implants throughout the prostate gland. Other: No significant volume of ascites.  No pneumoperitoneum. Musculoskeletal: 1.1 cm sclerotic lesion in the right proximal femur (axial image 132 of series 2), and 1.3 cm sclerotic lesion in the  left femoral head (axial image 123 of series 2) both similar to the prior examination. No new suspicious appearing osseous lesions. IMPRESSION: 1. Stable appearance of treated lesion in the left upper lobe with resolution of adjacent satellite left upper lobe nodularity. No evidence of recurrence of treated left adrenal lesion. Sclerotic lesions throughout the  visualized axial and appendicular skeleton appear similar to the prior examination. No new signs of metastatic disease noted elsewhere in the chest, abdomen or pelvis. 2. Aortic atherosclerosis, in addition to three-vessel coronary artery disease. Please note that although the presence of coronary artery calcium documents the presence of coronary artery disease, the severity of this disease and any potential stenosis cannot be assessed on this non-gated CT examination. Assessment for potential risk factor modification, dietary therapy or pharmacologic therapy may be warranted, if clinically indicated. 3. Additional incidental findings, similar to the prior study, as above. Electronically Signed   By: Vinnie Langton M.D.   On: 10/21/2021 07:15   MR Brain W Wo Contrast  Result Date: 11/08/2021 CLINICAL DATA:  Brain/CNS neoplasm, monitor EXAM: MRI HEAD WITHOUT AND WITH CONTRAST TECHNIQUE: Multiplanar, multiecho pulse sequences of the brain and surrounding structures were obtained without and with intravenous contrast. CONTRAST:  59mL MULTIHANCE GADOBENATE DIMEGLUMINE 529 MG/ML IV SOLN COMPARISON:  09/05/2021 FINDINGS: Brain: There are multiple new small enhancing lesions on series 11 with some examples as follows: Right temporal lobe images 65, 70, 76; left temporal on image 60; right frontal lobe (images 125 and 129); left frontal lobe images 117, 123, 142); left parietal lobe image 110); left parietotemporal on image 84; left occipital on image 58; right occipital on image 53; white matter tracts about the lentiform nuclei bilaterally; posterior left  thalamus on image 83; cerebellum and pons (images 27, 46, 49). Small foci of cortical diffusion hyperintensity are present in the frontoparietal lobes bilaterally. A few of these do correspond to areas of enhancement above. Stable curvilinear enhancement of left occipitotemporal lesion (series 11, image 51). Stable punctate right external capsule lesion (image 95). Scattered small foci of T2 hyperintensity in the supratentorial white matter may reflect superimposed chronic microvascular ischemic changes. Foci of susceptibility again identified at the site of treated left inferior frontal and cerebellar lesions. There is no mass effect. Ventricles and sulci are stable in size and configuration. Vascular: Major vessel flow voids at the skull base are preserved. Skull and upper cervical spine: Normal marrow signal is preserved. Sinuses/Orbits: Paranasal sinuses are aerated. Orbits are unremarkable. Other: Sella is unremarkable. Persistent patchy bilateral mastoid effusions. IMPRESSION: Numerous new small enhancing lesions. There is no substantial edema but these are small in size and could reflect metastatic lesions. Given that some of these are cortical and demonstrate corresponding reduced diffusion, at least a subset may reflect subacute infarcts. Previously treated lesions are stable. Electronically Signed   By: Macy Mis M.D.   On: 11/08/2021 13:22   CT Abdomen Pelvis W Contrast  Result Date: 10/21/2021 CLINICAL DATA:  60 year old male with history of small cell lung cancer. Restaging examination. EXAM: CT CHEST, ABDOMEN, AND PELVIS WITH CONTRAST TECHNIQUE: Multidetector CT imaging of the chest, abdomen and pelvis was performed following the standard protocol during bolus administration of intravenous contrast. CONTRAST:  6mL OMNIPAQUE IOHEXOL 350 MG/ML SOLN COMPARISON:  Multiple priors, most recently CT the chest, abdomen and pelvis 09/12/2021. FINDINGS: CT CHEST FINDINGS Cardiovascular: Heart size is  normal. There is no significant pericardial fluid, thickening or pericardial calcification. There is aortic atherosclerosis, as well as atherosclerosis of the great vessels of the mediastinum and the coronary arteries, including calcified atherosclerotic plaque in the left anterior descending, left circumflex and right coronary arteries. Right internal jugular single-lumen porta cath with tip terminating at the superior cavoatrial junction. Mediastinum/Nodes: No pathologically enlarged mediastinal or hilar lymph nodes. Esophagus is unremarkable in appearance. No  axillary lymphadenopathy. Lungs/Pleura: Again noted is a thick-walled cavitary lesion in the left upper lobe (axial image 40 of series 4) which currently measures 2.3 x 1.4 cm (unchanged) at the site of the previously treated left upper lobe lesion. Previously noted area of nodularity cephalad to this has resolved compared to the prior examination. There continues to be extensive architectural distortion and volume loss in the medial aspect of the left upper and lower lobes where there is also extensive traction bronchiectasis, similar to the prior study, most compatible with an area of chronic postradiation mass-like fibrosis. No definite new suspicious appearing pulmonary nodules or masses are noted. No acute consolidative airspace disease. No pleural effusions. Musculoskeletal: Sclerotic lesion in the superior aspect of the sternum and sclerotic lesions in the T8 vertebra, similar to the prior study. Additional sclerotic lesions in the posterior elements of T6 and 7, similar in retrospect to the prior examination. No definite new osseous lesions. Old healed fracture of the anterior aspect of the left second rib incidentally noted. CT ABDOMEN PELVIS FINDINGS Hepatobiliary: No suspicious appearing cystic or solid hepatic lesions. No intra or extrahepatic biliary ductal dilatation. Gallbladder is normal in appearance. Pancreas: No pancreatic mass. No  pancreatic ductal dilatation. No pancreatic or peripancreatic fluid collections or inflammatory changes. Spleen: Unremarkable. Adrenals/Urinary Tract: 2.4 cm low-attenuation lesion in the interpolar region of the right kidney is compatible with a simple cyst. Left kidney and bilateral adrenal glands are normal in appearance. No hydroureteronephrosis. Urinary bladder is nearly completely decompressed, but otherwise unremarkable in appearance. Stomach/Bowel: The appearance of the stomach is normal. No pathologic dilatation of small bowel or colon. Normal appendix. Vascular/Lymphatic: Aortic atherosclerosis, without evidence of aneurysm or dissection in the abdominal or pelvic vasculature. No lymphadenopathy noted in the abdomen or pelvis. Reproductive: Brachytherapy implants throughout the prostate gland. Other: No significant volume of ascites.  No pneumoperitoneum. Musculoskeletal: 1.1 cm sclerotic lesion in the right proximal femur (axial image 132 of series 2), and 1.3 cm sclerotic lesion in the left femoral head (axial image 123 of series 2) both similar to the prior examination. No new suspicious appearing osseous lesions. IMPRESSION: 1. Stable appearance of treated lesion in the left upper lobe with resolution of adjacent satellite left upper lobe nodularity. No evidence of recurrence of treated left adrenal lesion. Sclerotic lesions throughout the visualized axial and appendicular skeleton appear similar to the prior examination. No new signs of metastatic disease noted elsewhere in the chest, abdomen or pelvis. 2. Aortic atherosclerosis, in addition to three-vessel coronary artery disease. Please note that although the presence of coronary artery calcium documents the presence of coronary artery disease, the severity of this disease and any potential stenosis cannot be assessed on this non-gated CT examination. Assessment for potential risk factor modification, dietary therapy or pharmacologic therapy may be  warranted, if clinically indicated. 3. Additional incidental findings, similar to the prior study, as above. Electronically Signed   By: Vinnie Langton M.D.   On: 10/21/2021 07:15    [facility] Clinician Interpretation: I have personally reviewed the radiological images as listed.  My interpretation, in the context of the patient's clinical presentation, is progressive disease   Assessment/Plan Metastatic cancer to brain [facility])  [name] presents with clinical syndrome, visual field phenomena, likely localizing to the left occipital cortex.  There is reason to suspect epileptic etiology given the clinical syndrome.  It is unclear what relationship the facial sensory changes have, if they are extension of focal epilepsy or a separate phenomena.  Also possible is leptomeningeal dissemination of small cell carcinoma.    We discussed initiating a trial of AED therapy to assess response and achieve diagnostic clarity.  Will plan for trial of Keppra 1000mg  BID x14 days, then re-assess.  He will keep careful track of event frequency during the Keppra trials.  If not improved, would consider 1 hour EEG, CSF analysis for cytology.   Progressive CNS findings (at least 16 small new lesions) may reflect a mixture of neoplastic and vascular or post-RT processes.  Repeat MRI brain is scheduled for next month; will plan to treat progressive lesions with radiosurgery as discussed in tumor board this week.  We additionally encouraged him to seek out exam with his ophthalmologist given visual symptoms, recent exam (August 2022).     We spent twenty additional minutes teaching regarding the natural history, biology, and historical experience in the treatment of neurologic complications of cancer.   We appreciate the opportunity to participate in the care of ISMAR YABUT.  We will touch base with him in 2 weeks for further eval.  All questions were answered. The patient knows to call the clinic with any  problems, questions or concerns. No barriers to learning were detected.  The total time spent in the encounter was 40 minutes and more than 50% was on counseling and review of test results   Ventura Sellers, MD Medical Director of Neuro-Oncology [facility] at [place] 11/13/21 12:43 PM

## 2021-11-14 ENCOUNTER — Telehealth: Payer: Self-pay | Admitting: Internal Medicine

## 2021-11-14 NOTE — Telephone Encounter (Signed)
Scheduled per 12/22 los, pt has been called and confirmed

## 2021-11-20 ENCOUNTER — Encounter: Payer: Self-pay | Admitting: Internal Medicine

## 2021-11-21 ENCOUNTER — Encounter: Payer: Self-pay | Admitting: Internal Medicine

## 2021-11-26 ENCOUNTER — Telehealth: Payer: Self-pay | Admitting: *Deleted

## 2021-11-26 NOTE — Telephone Encounter (Signed)
Received VM message from patient stating that he is having "seizures" in left arm and left leg.  Please advise.

## 2021-11-27 ENCOUNTER — Other Ambulatory Visit: Payer: Self-pay | Admitting: Internal Medicine

## 2021-11-27 MED ORDER — LEVETIRACETAM 750 MG PO TABS: 1500.0000 mg | ORAL_TABLET | Freq: Two times a day (BID) | ORAL | 2 refills | Status: DC

## 2021-11-27 NOTE — Progress Notes (Signed)
Phone call with [name].  He describes new episodes of numbness and twitching of his right arm, lasting several minutes.    We asked him to increase Keppra to 1500mg  BID.  If events continue/persist despite increased dosing inpatient evaluation may be the next step.  He is agreeable with this plan.  Ventura Sellers, MD

## 2021-11-28 ENCOUNTER — Encounter: Payer: Self-pay | Admitting: Internal Medicine

## 2021-11-28 ENCOUNTER — Telehealth: Payer: Self-pay

## 2021-11-28 NOTE — Telephone Encounter (Signed)
Contacted pt's wife regarding MyChart message. Wife stated pt went to bed very early on Thursday pm and slept well but woke up very confused. Confusion has gotten better throughout the day but pt is still slightly disoriented. Wife wanted to make Dr Mickeal Skinner aware. Pt's wife will call on call service if confusion worsens .

## 2021-11-29 ENCOUNTER — Other Ambulatory Visit: Payer: Self-pay | Admitting: Internal Medicine

## 2021-11-29 DIAGNOSIS — C3412 Malignant neoplasm of upper lobe, left bronchus or lung: Secondary | ICD-10-CM

## 2021-11-30 ENCOUNTER — Encounter: Payer: Self-pay | Admitting: Internal Medicine

## 2021-12-01 ENCOUNTER — Other Ambulatory Visit: Payer: Self-pay | Admitting: *Deleted

## 2021-12-01 ENCOUNTER — Encounter: Payer: Self-pay | Admitting: Internal Medicine

## 2021-12-01 DIAGNOSIS — C7931 Secondary malignant neoplasm of brain: Secondary | ICD-10-CM

## 2021-12-02 ENCOUNTER — Inpatient Hospital Stay (HOSPITAL_BASED_OUTPATIENT_CLINIC_OR_DEPARTMENT_OTHER): Payer: 59 | Admitting: Internal Medicine

## 2021-12-02 ENCOUNTER — Inpatient Hospital Stay: Payer: 59

## 2021-12-02 ENCOUNTER — Other Ambulatory Visit: Payer: Self-pay

## 2021-12-02 VITALS — BP 109/73 | HR 129 | Temp 97.7°F | Resp 16 | Ht 72.0 in | Wt 140.3 lb

## 2021-12-02 VITALS — BP 130/88 | HR 92 | Resp 16

## 2021-12-02 DIAGNOSIS — Z95828 Presence of other vascular implants and grafts: Secondary | ICD-10-CM

## 2021-12-02 DIAGNOSIS — Z510 Encounter for antineoplastic radiation therapy: Secondary | ICD-10-CM | POA: Insufficient documentation

## 2021-12-02 DIAGNOSIS — C7951 Secondary malignant neoplasm of bone: Secondary | ICD-10-CM | POA: Insufficient documentation

## 2021-12-02 DIAGNOSIS — C7931 Secondary malignant neoplasm of brain: Secondary | ICD-10-CM | POA: Insufficient documentation

## 2021-12-02 DIAGNOSIS — E860 Dehydration: Secondary | ICD-10-CM

## 2021-12-02 DIAGNOSIS — Z7952 Long term (current) use of systemic steroids: Secondary | ICD-10-CM | POA: Diagnosis not present

## 2021-12-02 DIAGNOSIS — Z7982 Long term (current) use of aspirin: Secondary | ICD-10-CM | POA: Diagnosis not present

## 2021-12-02 DIAGNOSIS — Z9221 Personal history of antineoplastic chemotherapy: Secondary | ICD-10-CM | POA: Insufficient documentation

## 2021-12-02 DIAGNOSIS — E222 Syndrome of inappropriate secretion of antidiuretic hormone: Secondary | ICD-10-CM | POA: Insufficient documentation

## 2021-12-02 DIAGNOSIS — Z79899 Other long term (current) drug therapy: Secondary | ICD-10-CM | POA: Insufficient documentation

## 2021-12-02 DIAGNOSIS — Z8042 Family history of malignant neoplasm of prostate: Secondary | ICD-10-CM | POA: Insufficient documentation

## 2021-12-02 DIAGNOSIS — Z803 Family history of malignant neoplasm of breast: Secondary | ICD-10-CM | POA: Insufficient documentation

## 2021-12-02 DIAGNOSIS — C3412 Malignant neoplasm of upper lobe, left bronchus or lung: Secondary | ICD-10-CM | POA: Insufficient documentation

## 2021-12-02 DIAGNOSIS — Z87891 Personal history of nicotine dependence: Secondary | ICD-10-CM | POA: Insufficient documentation

## 2021-12-02 LAB — CBC WITH DIFFERENTIAL (CANCER CENTER ONLY)
Abs Immature Granulocytes: 0.03 10*3/uL (ref 0.00–0.07)
Basophils Absolute: 0 10*3/uL (ref 0.0–0.1)
Basophils Relative: 0 %
Eosinophils Absolute: 0 10*3/uL (ref 0.0–0.5)
Eosinophils Relative: 0 %
HCT: 29.7 % — ABNORMAL LOW (ref 39.0–52.0)
Hemoglobin: 10.3 g/dL — ABNORMAL LOW (ref 13.0–17.0)
Immature Granulocytes: 0 %
Lymphocytes Relative: 9 %
Lymphs Abs: 0.7 10*3/uL (ref 0.7–4.0)
MCH: 36.4 pg — ABNORMAL HIGH (ref 26.0–34.0)
MCHC: 34.7 g/dL (ref 30.0–36.0)
MCV: 104.9 fL — ABNORMAL HIGH (ref 80.0–100.0)
Monocytes Absolute: 0.8 10*3/uL (ref 0.1–1.0)
Monocytes Relative: 10 %
Neutro Abs: 6.1 10*3/uL (ref 1.7–7.7)
Neutrophils Relative %: 81 %
Platelet Count: 224 10*3/uL (ref 150–400)
RBC: 2.83 MIL/uL — ABNORMAL LOW (ref 4.22–5.81)
RDW: 16.6 % — ABNORMAL HIGH (ref 11.5–15.5)
WBC Count: 7.6 10*3/uL (ref 4.0–10.5)
nRBC: 0 % (ref 0.0–0.2)

## 2021-12-02 LAB — SAMPLE TO BLOOD BANK

## 2021-12-02 LAB — CMP (CANCER CENTER ONLY)
ALT: 26 U/L (ref 0–44)
AST: 26 U/L (ref 15–41)
Albumin: 4.5 g/dL (ref 3.5–5.0)
Alkaline Phosphatase: 82 U/L (ref 38–126)
Anion gap: 10 (ref 5–15)
BUN: 30 mg/dL — ABNORMAL HIGH (ref 6–20)
CO2: 28 mmol/L (ref 22–32)
Calcium: 9.8 mg/dL (ref 8.9–10.3)
Chloride: 101 mmol/L (ref 98–111)
Creatinine: 1.07 mg/dL (ref 0.61–1.24)
GFR, Estimated: >60 mL/min (ref >60)
Glucose, Bld: 142 mg/dL — ABNORMAL HIGH (ref 70–99)
Potassium: 3.4 mmol/L — ABNORMAL LOW (ref 3.5–5.1)
Sodium: 139 mmol/L (ref 135–145)
Total Bilirubin: 0.6 mg/dL (ref 0.3–1.2)
Total Protein: 7.7 g/dL (ref 6.5–8.1)

## 2021-12-02 MED ORDER — SODIUM CHLORIDE 0.9 % IV SOLN: Freq: Once | INTRAVENOUS | Status: AC

## 2021-12-02 MED ORDER — SODIUM CHLORIDE 0.9% FLUSH
10.0000 mL | Freq: Once | INTRAVENOUS | Status: AC
  Administered 2021-12-02: 10 mL

## 2021-12-02 MED ORDER — HEPARIN SOD (PORK) LOCK FLUSH 100 UNIT/ML IV SOLN
500.0000 [IU] | Freq: Once | INTRAVENOUS | Status: AC
  Administered 2021-12-02: 500 [IU]

## 2021-12-02 MED ORDER — DIVALPROEX SODIUM 250 MG PO DR TAB: 250.0000 mg | DELAYED_RELEASE_TABLET | Freq: Two times a day (BID) | ORAL | 2 refills | Status: DC

## 2021-12-02 MED ORDER — DEXAMETHASONE 4 MG PO TABS: 4.0000 mg | ORAL_TABLET | Freq: Every day | ORAL | 3 refills | Status: DC

## 2021-12-02 NOTE — Patient Instructions (Signed)

## 2021-12-02 NOTE — Progress Notes (Signed)
[place] [facility] [address], [place] [address] [phone]   Interval Evaluation  Date of Service: 12/02/21 Patient Name: Richard Santos Patient MRN: [id number] Patient DOB: 06/11/1961 Provider: Ventura Sellers, MD  Identifying Statement:  Richard Santos is a 60 y.o. male with Metastatic cancer to brain [facility])  Primary Cancer:  Oncologic History: Oncology History  Small cell lung cancer, left upper lobe ([place])  04/18/2020 Initial Diagnosis   Small cell lung cancer, left upper lobe ([place])   05/06/2020 - 05/29/2021 Chemotherapy          12/25/2020 Cancer Staging   Staging form: Lung, AJCC 8th Edition - Clinical: Stage IVB (cT2b, cN2, cM1c) - Signed by Curt Bears, MD on 12/25/2020    06/24/2021 -  Chemotherapy   Patient is on Treatment Plan : LUNG SMALL CELL Carboplatin D1 / Etoposide D1-3 q21d      CNS Oncologic History 09/11/20: Completes PCI irradiation 05/30/21: Salvage SRS x5 Richard Santos)  Interval History: Richard Santos presents today with progressive clinical complaints.  He and his wife describe increased lethargy, generalized weakness, poor appetite.  He has barely been eating since late last week.  He seems to exhibit increased confusion as well.  Wife describes several episodes of right hand twitching, which stopped after dosing the Keppra.  However, his lethargy and confusion seem to have started at the same time as the [place].    H+P (11/13/21) Patient presents today to discuss new neurologic symptoms.  He describes 3 weeks history of episodes of "flashing blue and yellow lights" in the right side of his vision, lasting for a few minutes.  Episodes do persist when right eye is closed, or at least appear to.  Frequency is "multiple times per day, maybe even once per hour".  He had one episode of confusion which may have followed one of the events.  Also describes feeling of numbness affecting right side of mouth and lower face.   These may be associated with the visual symptoms, onset was the same time period.  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  Recent MRI demonstrates progressive findings, was reviewed earlier this week with radiation oncology.  Medications: Current Outpatient Medications on File Prior to Visit  Medication Sig Dispense Refill   aspirin EC 81 MG tablet Take 81 mg by mouth daily. Swallow whole.     Budeson-Glycopyrrol-Formoterol (BREZTRI AEROSPHERE) 160-9-4.8 MCG/ACT AERO Inhale 2 puffs into the lungs in the morning and at bedtime. 10.7 g 11   busPIRone (BUSPAR) 10 MG tablet Take 10 mg by mouth 2 (two) times daily.     famotidine (PEPCID) 20 MG tablet Take 1 tablet (20 mg total) by mouth 2 (two) times daily. 60 tablet 2   levETIRAcetam (KEPPRA) 750 MG tablet Take 2 tablets (1,500 mg total) by mouth 2 (two) times daily. 120 tablet 2   lovastatin (MEVACOR) 20 MG tablet Take 1 tablet (20 mg total) by mouth at bedtime. 90 tablet 3   Multiple Vitamin (MULTIVITAMIN) capsule Take 1 capsule by mouth daily.     ondansetron (ZOFRAN-ODT) 8 MG disintegrating tablet TAKE 1 TABLET BY MOUTH EVERY 8 HOURS AS NEEDED FOR NAUSEA OR VOMITING. 30 tablet 2   pantoprazole (PROTONIX) 40 MG tablet Take 40 mg by mouth every morning.     prochlorperazine (COMPAZINE) 10 MG tablet TAKE 1 TABLET BY MOUTH EVERY 6 HOURS AS NEEDED 30 tablet 2  sodium bicarbonate 650 MG tablet TAKE 1 TABLET BY MOUTH TWICE A DAY 60 tablet 1   traMADol (ULTRAM) 50 MG tablet TAKE 1/2 TABLET BY MOUTH TWICE A DAY AS NEEDED FOR PAIN 60 tablet 2   Current Facility-Administered Medications on File Prior to Visit  Medication Dose Route Frequency Provider Last Rate Last Admin   heparin lock flush 100 unit/mL  500 Units Intracatheter Daily PRN Curt Bears, MD       sodium chloride flush (NS) 0.9 % injection 10 mL  10 mL Intracatheter PRN Curt Bears, MD       sodium chloride flush (NS) 0.9 % injection 10 mL  10  mL Intracatheter PRN Curt Bears, MD   10 mL at 07/16/21 1606    Allergies:  Allergies  Allergen Reactions   Bee Venom Swelling   Elemental Sulfur Other (See Comments)    Acute renal failure   Other Other (See Comments)    Chemo medicine- patient not sure about name - swelling & rashes   Sulfa Antibiotics    Namenda [Memantine] Nausea Only    Nausea and fatigue   Past Medical History:  Past Medical History:  Diagnosis Date   Allergic rhinitis    Allergy    Anxiety    Chronic low back pain    DDD (degenerative disc disease), lumbar    ED (erectile dysfunction)    Finger injury    cut pads off 3rd and 4th finger left hand/due to lawn mower accident   GERD (gastroesophageal reflux disease)    Heart murmur    as child only-    History of kidney stones    HTN (hypertension) 08/20/2016   Hyperlipidemia    Incomplete right bundle branch block    Prostate cancer ([place]) dx 10/02/14   stage T1c   Sigmoid diverticulosis    Small cell lung cancer ([place]) 03/2020   Wears glasses    Past Surgical History:  Past Surgical History:  Procedure Laterality Date   BRONCHIAL BIOPSY  04/12/2020   Procedure: BRONCHIAL BIOPSIES;  Surgeon: Marshell Garfinkel, MD;  Location: [place];  Service: Cardiopulmonary;;   BRONCHIAL BRUSHINGS  04/12/2020   Procedure: BRONCHIAL BRUSHINGS;  Surgeon: Marshell Garfinkel, MD;  Location: [facility];  Service: Cardiopulmonary;;   BRONCHIAL NEEDLE ASPIRATION BIOPSY  04/12/2020   Procedure: BRONCHIAL NEEDLE ASPIRATION BIOPSIES;  Surgeon: Marshell Garfinkel, MD;  Location: [facility];  Service: Cardiopulmonary;;   BRONCHIAL WASHINGS  04/12/2020   Procedure: BRONCHIAL WASHINGS;  Surgeon: Marshell Garfinkel, MD;  Location: [facility];  Service: Cardiopulmonary;;   COLONOSCOPY     COLONOSCOPY W/ POLYPECTOMY  04-19-2012   ENDOBRONCHIAL ULTRASOUND N/A 04/12/2020   Procedure: ENDOBRONCHIAL ULTRASOUND;  Surgeon: Marshell Garfinkel, MD;  Location: [facility];  Service:  Cardiopulmonary;  Laterality: N/A;   ESOPHAGOGASTRODUODENOSCOPY  08-05-2010   HEMOSTASIS CONTROL  04/12/2020   Procedure: HEMOSTASIS CONTROL;  Surgeon: Marshell Garfinkel, MD;  Location: [facility];  Service: Cardiopulmonary;;   IR IMAGING GUIDED PORT INSERTION  05/31/2020   POLYPECTOMY     PROSTATE BIOPSY  10/02/14   RADIOACTIVE SEED IMPLANT N/A 01/30/2015   Procedure: RADIOACTIVE SEED IMPLANT;  Surgeon: Rana Snare, MD;  Location: [facility];  Service: Urology;  Laterality: N/A;   UPPER GASTROINTESTINAL ENDOSCOPY  04/03/2021   VIDEO BRONCHOSCOPY N/A 04/12/2020   Procedure: VIDEO BRONCHOSCOPY WITHOUT FLUORO;  Surgeon: Marshell Garfinkel, MD;  Location: [facility];  Service: Cardiopulmonary;  Laterality: N/A;   Social History:  Social History   Socioeconomic  History   Marital status: Married    Spouse name: Not on file   Number of children: 0   Years of education: Not on file   Highest education level: Not on file  Occupational History   Occupation: Public relations account executive    Employer: [employer]  Tobacco Use   Smoking status: Every Day    Packs/day: 0.50    Years: 25.00    Pack years: 12.50    Types: Cigarettes   Smokeless tobacco: Never   Tobacco comments:    smokes 10 cigs per day 08/26/21  Vaping Use   Vaping Use: Former  Substance and Sexual Activity   Alcohol use: Yes    Alcohol/week: 49.0 standard drinks    Types: 25 Cans of beer, 24 Standard drinks or equivalent per week    Comment: 5 beer per day   Drug use: No   Sexual activity: Not on file  Other Topics Concern   Not on file  Social History Narrative   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 He is married with no biological children.   Social Determinants of Health   Financial Resource Strain: Not on file  Food Insecurity: Not on file  Transportation Needs: Not on file  Physical Activity: Not on file  Stress: Not on file  Social Connections: Not on file  Intimate  Partner Violence: Not on file   Family History:  Family History  Problem Relation Age of Onset   Prostate cancer Father    Heart disease Father    Heart disease Mother    Breast cancer Sister    Heart disease Other    Colon cancer Neg Hx    Colon polyps Neg Hx    Esophageal cancer Neg Hx    Rectal cancer Neg Hx    Stomach cancer Neg Hx     Review of Systems: Constitutional: Doesn't report fevers, chills or abnormal weight loss Eyes: Doesn't report blurriness of vision Ears, nose, mouth, throat, and face: Doesn't report sore throat Respiratory: Doesn't report cough, dyspnea or wheezes Cardiovascular: Doesn't report palpitation, chest discomfort  Gastrointestinal:  Doesn't report nausea, constipation, diarrhea GU: Doesn't report incontinence Skin: Doesn't report skin rashes Neurological: Per HPI Musculoskeletal: Doesn't report joint pain Behavioral/Psych: Doesn't report anxiety  Physical Exam: Vitals:   12/02/21 1156  BP: 109/73  Pulse: (!) 129  Resp: 16  Temp: 97.7 F (36.5 C)  SpO2: 96%   KPS: 70. General: Alert, cooperative, pleasant, in no acute distress Head: Normal EENT: No conjunctival injection or scleral icterus.  Lungs: Resp effort normal Cardiac: Regular rate Abdomen: Non-distended abdomen Skin: No rashes cyanosis or petechiae. Extremities: No clubbing or edema  Neurologic Exam: Mental Status: Awake but drowsy, attentive to examiner with verbal stimulation. Oriented to self and environment. Language is fluent with intact comprehension.  Noted psychomotor slowing today. Cranial Nerves: Visual acuity is grossly normal. Visual fields are full. Extra-ocular movements intact. No ptosis. Face is symmetric Motor: Tone and bulk are normal. Power is full in both arms and legs. Reflexes are symmetric, no pathologic reflexes present.  Sensory: Intact to light touch Gait: Normal.   Labs: I have reviewed the data as listed    Component Value Date/Time   NA  140 10/27/2021 0946   K 4.1 10/27/2021 0946   CL 105 10/27/2021 0946   CO2 27 10/27/2021 0946   GLUCOSE 112 (H) 10/27/2021 0946   BUN 13 10/27/2021 0946   CREATININE 0.88 10/27/2021  0946   CALCIUM 9.1 10/27/2021 0946   PROT 7.2 10/27/2021 0946   ALBUMIN 3.8 10/27/2021 0946   AST 32 10/27/2021 0946   ALT 30 10/27/2021 0946   ALKPHOS 92 10/27/2021 0946   BILITOT 0.3 10/27/2021 0946   GFRNONAA >60 10/27/2021 0946   GFRAA >60 08/08/2020 1203   Lab Results  Component Value Date   WBC 1.8 (L) 10/27/2021   NEUTROABS 0.9 (L) 10/27/2021   HGB 9.1 (L) 10/27/2021   HCT 25.8 (L) 10/27/2021   MCV 94.5 10/27/2021   PLT 166 10/27/2021    Imaging:  MR Brain W Wo Contrast  Result Date: 11/08/2021 CLINICAL DATA:  Brain/CNS neoplasm, monitor EXAM: MRI HEAD WITHOUT AND WITH CONTRAST TECHNIQUE: Multiplanar, multiecho pulse sequences of the brain and surrounding structures were obtained without and with intravenous contrast. CONTRAST:  40mL MULTIHANCE GADOBENATE DIMEGLUMINE 529 MG/ML IV SOLN COMPARISON:  09/05/2021 FINDINGS: Brain: There are multiple new small enhancing lesions on series 11 with some examples as follows: Right temporal lobe images 65, 70, 76; left temporal on image 60; right frontal lobe (images 125 and 129); left frontal lobe images 117, 123, 142); left parietal lobe image 110); left parietotemporal on image 84; left occipital on image 58; right occipital on image 53; white matter tracts about the lentiform nuclei bilaterally; posterior left thalamus on image 83; cerebellum and pons (images 27, 46, 49). Small foci of cortical diffusion hyperintensity are present in the frontoparietal lobes bilaterally. A few of these do correspond to areas of enhancement above. Stable curvilinear enhancement of left occipitotemporal lesion (series 11, image 51). Stable punctate right external capsule lesion (image 95). Scattered small foci of T2 hyperintensity in the supratentorial white matter may  reflect superimposed chronic microvascular ischemic changes. Foci of susceptibility again identified at the site of treated left inferior frontal and cerebellar lesions. There is no mass effect. Ventricles and sulci are stable in size and configuration. Vascular: Major vessel flow voids at the skull base are preserved. Skull and upper cervical spine: Normal marrow signal is preserved. Sinuses/Orbits: Paranasal sinuses are aerated. Orbits are unremarkable. Other: Sella is unremarkable. Persistent patchy bilateral mastoid effusions. IMPRESSION: Numerous new small enhancing lesions. There is no substantial edema but these are small in size and could reflect metastatic lesions. Given that some of these are cortical and demonstrate corresponding reduced diffusion, at least a subset may reflect subacute infarcts. Previously treated lesions are stable. Electronically Signed   By: Macy Mis M.D.   On: 11/08/2021 13:22      Assessment/Plan Metastatic cancer to brain [facility])  Richard Santos presents with clinical changes consistent with non-specific encephalopathy.  We suspect he was having seizures, as supported by resolution of discrete neurologic episodes with initiation of Keppra.  There does unfortunately seem to be a correlation with decline in cognition and initiation of Keppra, although there are other reasons to explain such findings, given high burden of CNS lesions and prior treatment.  In addition, he is clinically dehydrated today and has had poor PO intake over the past 5 days.  Labs were reviewed and fortunately were within normal limits.  We made the following recommendations today: -1035mL normal saline infusion in symptom management over 1-2 hours -Replace Keppra with Depakote 250mg  BID -Initiate therapy with dexamethasone 4mg  daily for CNS edema, inflammation if present -MRI brain scheduled for 12/19/21 to further evaluate and discriminate new lesions  If not improved, will consider  transitioning MRI to STAT.  We appreciate the opportunity to  participate in the care of Richard Santos.  He will return to clinic in 3 weeks following MRI brain, or sooner if needed.  All questions were answered. The patient knows to call the clinic with any problems, questions or concerns. No barriers to learning were detected.  The total time spent in the encounter was 40 minutes and more than 50% was on counseling and review of test results   Ventura Sellers, MD Medical Director of Neuro-Oncology [facility] 12/02/21 12:02 PM

## 2021-12-03 ENCOUNTER — Telehealth: Payer: Self-pay | Admitting: Internal Medicine

## 2021-12-03 ENCOUNTER — Encounter: Payer: Self-pay | Admitting: Internal Medicine

## 2021-12-03 ENCOUNTER — Other Ambulatory Visit: Payer: Self-pay | Admitting: Internal Medicine

## 2021-12-03 DIAGNOSIS — C3412 Malignant neoplasm of upper lobe, left bronchus or lung: Secondary | ICD-10-CM

## 2021-12-03 NOTE — Telephone Encounter (Signed)
Left message with follow-up appointment per 1/10 los.

## 2021-12-05 ENCOUNTER — Other Ambulatory Visit: Payer: Self-pay | Admitting: Internal Medicine

## 2021-12-05 NOTE — Telephone Encounter (Signed)
This medication was replaced with Depakote.  RX refused

## 2021-12-07 ENCOUNTER — Encounter: Payer: Self-pay | Admitting: Internal Medicine

## 2021-12-14 ENCOUNTER — Other Ambulatory Visit: Payer: Self-pay | Admitting: Physician Assistant

## 2021-12-14 DIAGNOSIS — R110 Nausea: Secondary | ICD-10-CM

## 2021-12-15 ENCOUNTER — Encounter: Payer: Self-pay | Admitting: Internal Medicine

## 2021-12-16 ENCOUNTER — Encounter: Payer: Self-pay | Admitting: Radiation Oncology

## 2021-12-16 ENCOUNTER — Encounter: Payer: Self-pay | Admitting: Internal Medicine

## 2021-12-16 NOTE — Progress Notes (Signed)
Spoke w/ patients spouse 'Mrs. Lane Hacker', verified identity, and begin nursing interview. She states "Mr. Jaymen Fetch is doing well." No symptoms reported at this time.  Meaningful use complete.  Mrs. Steckman notified of patient's 2:00pm-12/22/21 telephone appointment w/ Shona Simpson PA-C. I left my extension [phone] in case patient needs to call. Mrs. Wiechman verbalized understanding of information given.  Patient contact [phone]

## 2021-12-18 ENCOUNTER — Ambulatory Visit (HOSPITAL_COMMUNITY)
Admission: RE | Admit: 2021-12-18 | Discharge: 2021-12-18 | Disposition: A | Discharge status: Home / Self Care | Payer: 59 | Source: Ambulatory Visit | Attending: Radiation Oncology | Admitting: Radiation Oncology

## 2021-12-18 ENCOUNTER — Other Ambulatory Visit: Payer: Self-pay

## 2021-12-18 ENCOUNTER — Other Ambulatory Visit: Payer: 59

## 2021-12-18 ENCOUNTER — Other Ambulatory Visit (HOSPITAL_COMMUNITY): Payer: 59

## 2021-12-18 DIAGNOSIS — C3412 Malignant neoplasm of upper lobe, left bronchus or lung: Secondary | ICD-10-CM | POA: Diagnosis present

## 2021-12-18 DIAGNOSIS — C7931 Secondary malignant neoplasm of brain: Secondary | ICD-10-CM | POA: Diagnosis present

## 2021-12-18 MED ORDER — SODIUM CHLORIDE 0.9% FLUSH: 10.0000 mL | Freq: Two times a day (BID) | INTRAVENOUS | Status: DC

## 2021-12-18 MED ORDER — CHLORHEXIDINE GLUCONATE CLOTH 2 % EX PADS: 6.0000 | MEDICATED_PAD | Freq: Every day | CUTANEOUS | Status: DC

## 2021-12-18 MED ORDER — HEPARIN SOD (PORK) LOCK FLUSH 100 UNIT/ML IV SOLN
500.0000 [IU] | INTRAVENOUS | Status: AC | PRN
  Administered 2021-12-18: 500 [IU]
  Filled 2021-12-18: qty 5

## 2021-12-18 MED ORDER — GADOBUTROL 1 MMOL/ML IV SOLN
6.0000 mL | Freq: Once | INTRAVENOUS | Status: AC | PRN
  Administered 2021-12-18: 6 mL via INTRAVENOUS

## 2021-12-19 ENCOUNTER — Ambulatory Visit (HOSPITAL_COMMUNITY)
Admission: RE | Admit: 2021-12-19 | Discharge: 2021-12-19 | Disposition: A | Discharge status: Home / Self Care | Payer: 59 | Source: Ambulatory Visit | Attending: Physician Assistant | Admitting: Physician Assistant

## 2021-12-19 ENCOUNTER — Inpatient Hospital Stay: Payer: 59

## 2021-12-19 DIAGNOSIS — C3412 Malignant neoplasm of upper lobe, left bronchus or lung: Secondary | ICD-10-CM

## 2021-12-19 DIAGNOSIS — C7931 Secondary malignant neoplasm of brain: Secondary | ICD-10-CM | POA: Diagnosis not present

## 2021-12-19 DIAGNOSIS — Z95828 Presence of other vascular implants and grafts: Secondary | ICD-10-CM

## 2021-12-19 DIAGNOSIS — Z510 Encounter for antineoplastic radiation therapy: Secondary | ICD-10-CM | POA: Diagnosis not present

## 2021-12-19 LAB — SAMPLE TO BLOOD BANK

## 2021-12-19 LAB — CMP (CANCER CENTER ONLY)
ALT: 62 U/L — ABNORMAL HIGH (ref 0–44)
AST: 39 U/L (ref 15–41)
Albumin: 4.3 g/dL (ref 3.5–5.0)
Alkaline Phosphatase: 56 U/L (ref 38–126)
Anion gap: 8 (ref 5–15)
BUN: 30 mg/dL — ABNORMAL HIGH (ref 6–20)
CO2: 30 mmol/L (ref 22–32)
Calcium: 9.6 mg/dL (ref 8.9–10.3)
Chloride: 97 mmol/L — ABNORMAL LOW (ref 98–111)
Creatinine: 0.86 mg/dL (ref 0.61–1.24)
GFR, Estimated: >60 mL/min (ref >60)
Glucose, Bld: 102 mg/dL — ABNORMAL HIGH (ref 70–99)
Potassium: 4 mmol/L (ref 3.5–5.1)
Sodium: 135 mmol/L (ref 135–145)
Total Bilirubin: 0.4 mg/dL (ref 0.3–1.2)
Total Protein: 6.9 g/dL (ref 6.5–8.1)

## 2021-12-19 LAB — CBC WITH DIFFERENTIAL (CANCER CENTER ONLY)
Abs Immature Granulocytes: 0.06 10*3/uL (ref 0.00–0.07)
Basophils Absolute: 0 10*3/uL (ref 0.0–0.1)
Basophils Relative: 0 %
Eosinophils Absolute: 0 10*3/uL (ref 0.0–0.5)
Eosinophils Relative: 0 %
HCT: 32.5 % — ABNORMAL LOW (ref 39.0–52.0)
Hemoglobin: 11.3 g/dL — ABNORMAL LOW (ref 13.0–17.0)
Immature Granulocytes: 1 %
Lymphocytes Relative: 10 %
Lymphs Abs: 0.8 10*3/uL (ref 0.7–4.0)
MCH: 36.2 pg — ABNORMAL HIGH (ref 26.0–34.0)
MCHC: 34.8 g/dL (ref 30.0–36.0)
MCV: 104.2 fL — ABNORMAL HIGH (ref 80.0–100.0)
Monocytes Absolute: 0.6 10*3/uL (ref 0.1–1.0)
Monocytes Relative: 8 %
Neutro Abs: 5.9 10*3/uL (ref 1.7–7.7)
Neutrophils Relative %: 81 %
Platelet Count: 166 10*3/uL (ref 150–400)
RBC: 3.12 MIL/uL — ABNORMAL LOW (ref 4.22–5.81)
RDW: 14.3 % (ref 11.5–15.5)
WBC Count: 7.3 10*3/uL (ref 4.0–10.5)
nRBC: 0 % (ref 0.0–0.2)

## 2021-12-19 MED ORDER — IOHEXOL 9 MG/ML PO SOLN
1000.0000 mL | ORAL | Status: AC
  Administered 2021-12-19: 1000 mL via ORAL

## 2021-12-19 MED ORDER — SODIUM CHLORIDE (PF) 0.9 % IJ SOLN
INTRAMUSCULAR | Status: AC
  Filled 2021-12-19: qty 50

## 2021-12-19 MED ORDER — HEPARIN SOD (PORK) LOCK FLUSH 100 UNIT/ML IV SOLN
INTRAVENOUS | Status: AC
  Filled 2021-12-19: qty 5

## 2021-12-19 MED ORDER — IOHEXOL 300 MG/ML  SOLN
100.0000 mL | Freq: Once | INTRAMUSCULAR | Status: AC | PRN
  Administered 2021-12-19: 100 mL via INTRAVENOUS

## 2021-12-19 MED ORDER — HEPARIN SOD (PORK) LOCK FLUSH 100 UNIT/ML IV SOLN
500.0000 [IU] | Freq: Once | INTRAVENOUS | Status: AC
  Administered 2021-12-19: 500 [IU] via INTRAVENOUS

## 2021-12-19 MED ORDER — SODIUM CHLORIDE 0.9% FLUSH
10.0000 mL | Freq: Once | INTRAVENOUS | Status: AC
  Administered 2021-12-19: 10 mL

## 2021-12-19 MED ORDER — IOHEXOL 9 MG/ML PO SOLN
ORAL | Status: AC
  Filled 2021-12-19: qty 1000

## 2021-12-22 ENCOUNTER — Other Ambulatory Visit: Payer: Self-pay | Admitting: Radiation Oncology

## 2021-12-22 ENCOUNTER — Ambulatory Visit
Admission: RE | Admit: 2021-12-22 | Discharge: 2021-12-22 | Disposition: A | Discharge status: Home / Self Care | Payer: 59 | Source: Ambulatory Visit | Attending: Radiation Oncology | Admitting: Radiation Oncology

## 2021-12-22 ENCOUNTER — Inpatient Hospital Stay: Payer: 59

## 2021-12-22 DIAGNOSIS — C3412 Malignant neoplasm of upper lobe, left bronchus or lung: Secondary | ICD-10-CM

## 2021-12-22 NOTE — Progress Notes (Signed)
Radiation Oncology         [phone] ________________________________  Outpatient Reconsultation - Conducted via telephone due to current COVID-19 concerns for limiting patient exposure  I spoke with the patient to conduct this consult visit via telephone to spare the patient unnecessary potential exposure in the healthcare setting during the current COVID-19 pandemic. The patient was notified in advance and was offered a [place] meeting to allow for face to face communication but unfortunately reported that they did not have the appropriate resources/technology to support such a visit and instead preferred to proceed with a telephone visit.  Name: Richard Santos        MRN: [id number]  Date of Service: 12/22/2021 DOB: 11-03-1961  Richard Santos  [name], Santos     REFERRING PHYSICIAN: [name], Santos   DIAGNOSIS: The encounter diagnosis was Small cell lung cancer, left upper lobe ([place]).   HISTORY OF PRESENT ILLNESS: Richard Santos is a 61 y.o. male  with a history of extensive stage small cell lung cancer.  The patient had a history of prostate cancer that was treated in 2016 with radioactive seed placement.  He was diagnosed with small cell carcinoma in May 2021 and received chemoradiation in the summer 2021, during his work-up he did have a 6 mm lesion in the brain along the cerebellum, however with repeat imaging on 08/03/2020, this was felt to be rather a subacute infarct and measured 4 mm.  He was counseled following the course of chemoradiation on prophylactic cranial irradiation and received this regimen in October 2021.  Of note he was unable to tolerate Namenda. Over the summer of 2022 he was found to have disease in the left adrenal gland and his surveillance brain MRI on 05/15/21  showed 5 new lesions in the brain the largest was 9 mm in the left temporoparietal lobe with edema. The other lesions were 4 mm in the right temporal lobe, 5 mm in the right lateral basal  ganglia/external capsule, 3.5 mm left frontal lobe, and 3 mm left head of caudate. The prior area described in the past as a subacute infarct was read as a treated lesion without recurrence.   He proceeded with salvage SRS on 05/30/21 and also completed palliative radiotherapy to the left adrenal gland on 06/09/21. 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 He was able to complete a total of 6 cycles of chemotherapy on 10/08/21. An MRI of the brain on 11/06/21 that showed numerous small enhancing lesions. There were features consistent in some of these appearing like infarcts.  Patient is more made for repeat MRI in a month's time.  He is contacted by phone to review these results. He did have a CT restaging of the chest abdomen and pelvis yesterday that did not show any new disease in the lungs or adrenal region no evidence of new metastatic changes were identified.  An MRI scan however on 12/18/2021 did show innumerable brain metastases with mild to moderate edema no evidence of significant mass-effect was identified but he had disease the largest of his findings being up to 2 cm in greatest dimension and in the brainstem and cerebellum invasion up to 1.2 cm in greatest dimension.  His case was discussed in brain and spine oncology conference this morning and Dr. Lisbeth Renshaw recommended consideration of reirradiation with whole brain radiotherapy.  He is contacted by phone to review these results.    PREVIOUS RADIATION THERAPY:  Yes   05/30/21 SRS Treatment: The Following sites were treated with Salvage SRS to 20 Gy each in a single fraction. PTV_1TemporalR PTV_2TemporR PTV_3GangliaR PTV_4FrontR  PTV_5CaudateL  05/15/21-06/09/21:  The patient received 37.5 Gy in a palliative radiotherapy course over 15 fractions to the left adrenal gland.  08/26/20-09/06/20 Prophylactic Cranial Irradiation (PCI)Treatment: The patient's whole brain was treated to 25 Gy in 10 fractions     04/25/20 - 05/15/20: The patient was treated to the disease within the left lung initially to a dose of 37.5 Gy in 15 fractions using a 3 field, 3-D conformal technique.    2016:  The prostate was treated with radioactive seeds with the following prescription: Prostate 14,500 cGy. Isotope: I-125 utilizing 79 seeds and 29 active needles.  Individual seed activity 0.47 mCi per seed for a total implant activity of 37.13 mCi.  PAST MEDICAL HISTORY:  Past Medical History:  Diagnosis Date   Allergic rhinitis    Allergy    Anxiety    Chronic low back pain    DDD (degenerative disc disease), lumbar    ED (erectile dysfunction)    Finger injury    cut pads off 3rd and 4th finger left hand/due to lawn mower accident   GERD (gastroesophageal reflux disease)    Heart murmur    as child only-    History of kidney stones    HTN (hypertension) 08/20/2016   Hyperlipidemia    Incomplete right bundle branch block    Prostate cancer ([place]) dx 10/02/14   stage T1c   Sigmoid diverticulosis    Small cell lung cancer ([place]) 03/2020   Wears glasses        PAST SURGICAL HISTORY: Past Surgical History:  Procedure Laterality Date   BRONCHIAL BIOPSY  04/12/2020   Procedure: BRONCHIAL BIOPSIES;  Surgeon: Marshell Garfinkel, Santos;  Location: [facility];  Service: Cardiopulmonary;;   BRONCHIAL BRUSHINGS  04/12/2020   Procedure: BRONCHIAL BRUSHINGS;  Surgeon: Marshell Garfinkel, Santos;  Location: [facility];  Service: Cardiopulmonary;;   BRONCHIAL NEEDLE ASPIRATION BIOPSY  04/12/2020   Procedure: BRONCHIAL NEEDLE ASPIRATION BIOPSIES;  Surgeon: Marshell Garfinkel, Santos;  Location: [facility];  Service: Cardiopulmonary;;   BRONCHIAL WASHINGS  04/12/2020   Procedure: BRONCHIAL WASHINGS;  Surgeon: Marshell Garfinkel, Santos;  Location: [facility];  Service: Cardiopulmonary;;   COLONOSCOPY     COLONOSCOPY W/ POLYPECTOMY  04-19-2012   ENDOBRONCHIAL ULTRASOUND N/A 04/12/2020   Procedure: ENDOBRONCHIAL ULTRASOUND;  Surgeon:  Marshell Garfinkel, Santos;  Location: [facility];  Service: Cardiopulmonary;  Laterality: N/A;   ESOPHAGOGASTRODUODENOSCOPY  08-05-2010   HEMOSTASIS CONTROL  04/12/2020   Procedure: HEMOSTASIS CONTROL;  Surgeon: Marshell Garfinkel, Santos;  Location: [facility];  Service: Cardiopulmonary;;   IR IMAGING GUIDED PORT INSERTION  05/31/2020   POLYPECTOMY     PROSTATE BIOPSY  10/02/14   RADIOACTIVE SEED IMPLANT N/A 01/30/2015   Procedure: RADIOACTIVE SEED IMPLANT;  Surgeon: Rana Snare, Santos;  Location: [facility];  Service: Urology;  Laterality: N/A;   UPPER GASTROINTESTINAL ENDOSCOPY  04/03/2021   VIDEO BRONCHOSCOPY N/A 04/12/2020   Procedure: VIDEO BRONCHOSCOPY WITHOUT FLUORO;  Surgeon: Marshell Garfinkel, Santos;  Location: [facility];  Service: Cardiopulmonary;  Laterality: N/A;     FAMILY HISTORY:  Family History  Problem Relation Age of Onset   Prostate cancer Father    Heart disease Father    Heart disease Mother    Breast cancer Sister    Heart disease Other    Colon cancer Neg Hx  Colon polyps Neg Hx    Esophageal cancer Neg Hx    Rectal cancer Neg Hx    Stomach cancer Neg Hx      SOCIAL HISTORY:  reports that he has been smoking cigarettes. He has a 12.50 pack-year smoking history. He has never used smokeless tobacco. He reports current alcohol use of about 49.0 standard drinks per week. He reports that he does not use drugs. The patient is married and lives in [place]. 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   ALLERGIES: Bee venom, Elemental sulfur, Other, Sulfa antibiotics, and Namenda [memantine]   MEDICATIONS:  Current Outpatient Medications  Medication Sig Dispense Refill   aspirin EC 81 MG tablet Take 81 mg by mouth daily. Swallow whole.     Budeson-Glycopyrrol-Formoterol (BREZTRI AEROSPHERE) 160-9-4.8 MCG/ACT AERO Inhale 2 puffs into the lungs in the morning and at bedtime. 10.7 g 11   busPIRone (BUSPAR) 10 MG tablet Take 10 mg by mouth 2 (two) times  daily.     dexamethasone (DECADRON) 4 MG tablet Take 1 tablet (4 mg total) by mouth daily. 30 tablet 3   divalproex (DEPAKOTE) 250 MG DR tablet Take 1 tablet (250 mg total) by mouth 2 (two) times daily. 60 tablet 2   famotidine (PEPCID) 20 MG tablet Take 1 tablet (20 mg total) by mouth 2 (two) times daily. 60 tablet 2   lovastatin (MEVACOR) 20 MG tablet Take 1 tablet (20 mg total) by mouth at bedtime. 90 tablet 3   Multiple Vitamin (MULTIVITAMIN) capsule Take 1 capsule by mouth daily.     ondansetron (ZOFRAN-ODT) 8 MG disintegrating tablet TAKE 1 TABLET BY MOUTH EVERY 8 HOURS AS NEEDED FOR NAUSEA OR VOMITING. 30 tablet 2   pantoprazole (PROTONIX) 40 MG tablet Take 40 mg by mouth every morning.     prochlorperazine (COMPAZINE) 10 MG tablet TAKE 1 TABLET BY MOUTH EVERY 6 HOURS AS NEEDED 30 tablet 2   sodium bicarbonate 650 MG tablet TAKE 1 TABLET BY MOUTH TWICE A DAY 180 tablet 1   traMADol (ULTRAM) 50 MG tablet TAKE 1/2 TABLET BY MOUTH TWICE A DAY AS NEEDED FOR PAIN 60 tablet 2   No current facility-administered medications for this encounter.   Facility-Administered Medications Ordered in Other Encounters  Medication Dose Route Frequency Provider Last Rate Last Admin   heparin lock flush 100 unit/mL  500 Units Intracatheter Daily PRN Richard Bears, Santos       sodium chloride flush (NS) 0.9 % injection 10 mL  10 mL Intracatheter PRN Richard Bears, Santos       sodium chloride flush (NS) 0.9 % injection 10 mL  10 mL Intracatheter PRN Richard Bears, Santos   10 mL at 07/16/21 1606     REVIEW OF SYSTEMS:   On review of systems the patient unfortunately has been having some decline in the last few weeks.  He had been incredibly tired but was started on steroids and anticonvulsants while this has been adjusted somewhat, he is having the feelings of nausea regularly some emesis though this has also to some degree been an ongoing issue since chemotherapy.  He feels off balance at times and has had  several episodes of confusion.  No other headaches are reported.  He is not complaining of visual or auditory changes today but in previous notes from other providers he had been experiencing some changes with flashing lights and flashing colored lights.  No complaints of trouble breathing or chest pain are noted.  PHYSICAL EXAM:   Unable to assess due to encounter type.   ECOG = 1  0 - Asymptomatic (Fully active, able to carry on all predisease activities without restriction)  1 - Symptomatic but completely ambulatory (Restricted in physically strenuous activity but ambulatory and able to carry out work of a light or sedentary nature. For example, light housework, office work)  2 - Symptomatic, <50% in bed during the day (Ambulatory and capable of all self care but unable to carry out any work activities. Up and about more than 50% of waking hours)  3 - Symptomatic, >50% in bed, but not bedbound (Capable of only limited self-care, confined to bed or chair 50% or more of waking hours)  4 - Bedbound (Completely disabled. Cannot carry on any self-care. Totally confined to bed or chair)  5 - Death   Eustace Pen MM, Creech RH, Tormey DC, et al. [phone]). "Toxicity and response criteria of the [employer]". [address] Oncol. 5 (6): 649-55    LABORATORY DATA:  Lab Results  Component Value Date   WBC 7.3 12/19/2021   HGB 11.3 (L) 12/19/2021   HCT 32.5 (L) 12/19/2021   MCV 104.2 (H) 12/19/2021   PLT 166 12/19/2021   Lab Results  Component Value Date   NA 135 12/19/2021   K 4.0 12/19/2021   CL 97 (L) 12/19/2021   CO2 30 12/19/2021   Lab Results  Component Value Date   ALT 62 (H) 12/19/2021   AST 39 12/19/2021   ALKPHOS 56 12/19/2021   BILITOT 0.4 12/19/2021      RADIOGRAPHY: CT Chest W Contrast  Result Date: 12/21/2021 CLINICAL DATA:  Lung cancer, metastatic disease evaluation, chemotherapy and immunotherapy complete EXAM: CT CHEST, ABDOMEN, AND PELVIS WITH  CONTRAST TECHNIQUE: Multidetector CT imaging of the chest, abdomen and pelvis was performed following the standard protocol during bolus administration of intravenous contrast. RADIATION DOSE REDUCTION: This exam was performed according to the departmental dose-optimization program which includes automated exposure control, adjustment of the mA and/or kV according to patient size and/or use of iterative reconstruction technique. CONTRAST:  133mL OMNIPAQUE IOHEXOL 300 MG/ML SOLN, additional oral enteric contrast COMPARISON:  10/20/2021 FINDINGS: CT CHEST FINDINGS Cardiovascular: Right chest port catheter. Aortic atherosclerosis. Normal heart size. Three-vessel coronary artery calcifications. No pericardial effusion. Mediastinum/Nodes: No enlarged mediastinal, hilar, or axillary lymph nodes. Thyroid gland, trachea, and esophagus demonstrate no significant findings. Lungs/Pleura: Unchanged post treatment appearance of the left chest, with perihilar and paramedian fibrosis, consolidation, and bronchiectasis. Unchanged cavitary lesion of the peripheral left upper lobe measuring 2.4 x 1.4 cm (series 5, image 40). No pleural effusion or pneumothorax. Musculoskeletal: No chest wall mass. CT ABDOMEN PELVIS FINDINGS Hepatobiliary: No solid liver abnormality is seen. No gallstones, gallbladder wall thickening, or biliary dilatation. Pancreas: Unremarkable. No pancreatic ductal dilatation or surrounding inflammatory changes. Spleen: Normal in size without significant abnormality. Adrenals/Urinary Tract: Unchanged post treatment appearance of the left adrenal gland, without evidence of recurrent nodule (series 2, image 65). The right adrenal gland is normal. Kidneys are normal, without renal calculi, solid lesion, or hydronephrosis. Bladder is unremarkable. Stomach/Bowel: Stomach is within normal limits. Appendix appears normal. No evidence of bowel wall thickening, distention, or inflammatory changes. Moderate burden of stool  throughout the colon and rectum. Vascular/Lymphatic: Aortic atherosclerosis. No enlarged abdominal or pelvic lymph nodes. Reproductive: Prostate brachytherapy. Other: No abdominal wall hernia or abnormality. No ascites. Musculoskeletal: No acute osseous findings. Unchanged, scattered sclerotic osseous metastatic lesions, including of the  sternal body (series 7, image 91), the T6, T7, and T8 vertebral bodies, and bilateral proximal femurs (series 6, image 63, 54). IMPRESSION: 1. Unchanged post treatment appearance of the left chest, with perihilar and paramedian fibrosis, consolidation, and bronchiectasis. 2. Unchanged cavitary lesion of the peripheral left upper lobe measuring 2.4 x 1.4 cm. 3. Unchanged post treatment appearance of the left adrenal gland, without evidence of recurrent nodule. 4. No evidence of new metastatic disease in the chest, abdomen, or pelvis. 5. Unchanged, scattered sclerotic osseous metastatic lesions. No new osseous lesions. 6. Prostate brachytherapy. 7. Coronary artery disease. Aortic Atherosclerosis (ICD10-I70.0). Electronically Signed   By: Delanna Ahmadi M.D.   On: 12/21/2021 07:48   MR Brain W Wo Contrast  Result Date: 12/19/2021 CLINICAL DATA:  Metastatic lung cancer. EXAM: MRI HEAD WITHOUT AND WITH CONTRAST TECHNIQUE: Multiplanar, multiecho pulse sequences of the brain and surrounding structures were obtained without and with intravenous contrast. CONTRAST:  52mL GADAVIST GADOBUTROL 1 MMOL/ML IV SOLN COMPARISON:  Head MRI 11/06/2021 FINDINGS: Brain: There are innumerable solidly enhancing lesions throughout both cerebral hemispheres as well as cerebellum and brainstem which reflect a combination of enlargement of the enhancing foci on the prior MRI and development of new lesions. The largest measures 2 cm in the mesial right temporal lobe. Brainstem and cerebellar lesions measure up to 1.2 cm. There are blood products associated with some of the lesions, and the majority  demonstrate restricted diffusion. Associated vasogenic edema is mild in the posterior fossa and up to moderate in the left greater than right cerebral hemispheres without midline shift or other significant mass effect. No acute large territory infarct or extra-axial fluid collection is evident. There is mild cerebral atrophy. Vascular: Major intracranial vascular flow voids are preserved. Skull and upper cervical spine: Unremarkable bone marrow signal. Sinuses/Orbits: Unremarkable orbits. Trace mucosal thickening in the left maxillary sinus. Moderate right and small left mastoid effusions. Other: None. IMPRESSION: Innumerable brain metastases with mild-to-moderate edema. No significant mass effect. Electronically Signed   By: Logan Bores M.D.   On: 12/19/2021 13:59   CT Abdomen Pelvis W Contrast  Result Date: 12/21/2021 CLINICAL DATA:  Lung cancer, metastatic disease evaluation, chemotherapy and immunotherapy complete EXAM: CT CHEST, ABDOMEN, AND PELVIS WITH CONTRAST TECHNIQUE: Multidetector CT imaging of the chest, abdomen and pelvis was performed following the standard protocol during bolus administration of intravenous contrast. RADIATION DOSE REDUCTION: This exam was performed according to the departmental dose-optimization program which includes automated exposure control, adjustment of the mA and/or kV according to patient size and/or use of iterative reconstruction technique. CONTRAST:  178mL OMNIPAQUE IOHEXOL 300 MG/ML SOLN, additional oral enteric contrast COMPARISON:  10/20/2021 FINDINGS: CT CHEST FINDINGS Cardiovascular: Right chest port catheter. Aortic atherosclerosis. Normal heart size. Three-vessel coronary artery calcifications. No pericardial effusion. Mediastinum/Nodes: No enlarged mediastinal, hilar, or axillary lymph nodes. Thyroid gland, trachea, and esophagus demonstrate no significant findings. Lungs/Pleura: Unchanged post treatment appearance of the left chest, with perihilar and  paramedian fibrosis, consolidation, and bronchiectasis. Unchanged cavitary lesion of the peripheral left upper lobe measuring 2.4 x 1.4 cm (series 5, image 40). No pleural effusion or pneumothorax. Musculoskeletal: No chest wall mass. CT ABDOMEN PELVIS FINDINGS Hepatobiliary: No solid liver abnormality is seen. No gallstones, gallbladder wall thickening, or biliary dilatation. Pancreas: Unremarkable. No pancreatic ductal dilatation or surrounding inflammatory changes. Spleen: Normal in size without significant abnormality. Adrenals/Urinary Tract: Unchanged post treatment appearance of the left adrenal gland, without evidence of recurrent nodule (series 2, image 65). The right  adrenal gland is normal. Kidneys are normal, without renal calculi, solid lesion, or hydronephrosis. Bladder is unremarkable. Stomach/Bowel: Stomach is within normal limits. Appendix appears normal. No evidence of bowel wall thickening, distention, or inflammatory changes. Moderate burden of stool throughout the colon and rectum. Vascular/Lymphatic: Aortic atherosclerosis. No enlarged abdominal or pelvic lymph nodes. Reproductive: Prostate brachytherapy. Other: No abdominal wall hernia or abnormality. No ascites. Musculoskeletal: No acute osseous findings. Unchanged, scattered sclerotic osseous metastatic lesions, including of the sternal body (series 7, image 91), the T6, T7, and T8 vertebral bodies, and bilateral proximal femurs (series 6, image 63, 54). IMPRESSION: 1. Unchanged post treatment appearance of the left chest, with perihilar and paramedian fibrosis, consolidation, and bronchiectasis. 2. Unchanged cavitary lesion of the peripheral left upper lobe measuring 2.4 x 1.4 cm. 3. Unchanged post treatment appearance of the left adrenal gland, without evidence of recurrent nodule. 4. No evidence of new metastatic disease in the chest, abdomen, or pelvis. 5. Unchanged, scattered sclerotic osseous metastatic lesions. No new osseous lesions.  6. Prostate brachytherapy. 7. Coronary artery disease. Aortic Atherosclerosis (ICD10-I70.0). Electronically Signed   By: Delanna Ahmadi M.D.   On: 12/21/2021 07:48        IMPRESSION/PLAN: 1. Extensive stage small cell lung cancer arising in the left lung.  We reviewed the patient's case and Dr. Ida Rogue recommendations from our discussion this morning in brain and spine oncology conference.  Unfortunately he does have significantly progressed disease in the brain despite prophylactic cranial irradiation.  He has been off systemic therapy which was complicated by the development of SIADH.  He will follow-up with Dr. Julien Nordmann tomorrow.  We reviewed the process of reirradiation and whole brain radiotherapy.  We discussed that in conference Dr. Mickeal Skinner did not feel that rechallenging with Namenda was warranted as the patient did not tolerate this previously.  We also discussed that additional radiotherapy would likely be very limited if additional disease in the future was noted in the brain.  We also discussed the consideration of hospice as a part of the patient's care team however at this point they are interested in aggressive measures for his care and would except additional systemic therapy if this is offered.  We appreciate Dr. Worthy Flank recommendations as well regarding this specifically.  We reviewed the risks, benefits, short and long-term effects of radiotherapy and the patient will be contacted for simulation at which time he will also sign written consent for treatment. 2. Bilateral mastoid effusions and Tinnitus. The patient has had symptoms persistently despite medrol dose pack.  This is being followed but at our last interval he desired observation of this.  Given current concerns for patient exposure during the COVID-19 pandemic, this encounter was conducted via telephone.  The patient has provided two factor identification and has given verbal consent for this type of encounter and has been  advised to only accept a meeting of this type in a secure network environment. The time spent during this encounter was 45 minutes including preparation, discussion, and coordination of the patient's care. The attendants for this meeting include   Richard Santos  and Richard Santos and his wife Richard Santos.  During the encounter  Richard Santos was located at [facility] Radiation Oncology Department.  Richard Santos was located at home with his wife Richard Santos.    Carola Rhine, PAC

## 2021-12-23 ENCOUNTER — Inpatient Hospital Stay (HOSPITAL_BASED_OUTPATIENT_CLINIC_OR_DEPARTMENT_OTHER): Payer: 59 | Admitting: Internal Medicine

## 2021-12-23 ENCOUNTER — Other Ambulatory Visit: Payer: Self-pay

## 2021-12-23 ENCOUNTER — Inpatient Hospital Stay: Payer: 59 | Admitting: Nurse Practitioner

## 2021-12-23 ENCOUNTER — Encounter: Payer: Self-pay | Admitting: *Deleted

## 2021-12-23 ENCOUNTER — Ambulatory Visit
Admission: RE | Admit: 2021-12-23 | Discharge: 2021-12-23 | Disposition: A | Discharge status: Home / Self Care | Payer: 59 | Source: Ambulatory Visit | Attending: Radiation Oncology | Admitting: Radiation Oncology

## 2021-12-23 VITALS — BP 107/88 | HR 106 | Temp 97.5°F | Resp 17 | Ht 72.0 in | Wt 136.5 lb

## 2021-12-23 DIAGNOSIS — C3490 Malignant neoplasm of unspecified part of unspecified bronchus or lung: Secondary | ICD-10-CM | POA: Diagnosis not present

## 2021-12-23 DIAGNOSIS — C3412 Malignant neoplasm of upper lobe, left bronchus or lung: Secondary | ICD-10-CM | POA: Diagnosis not present

## 2021-12-23 DIAGNOSIS — C7931 Secondary malignant neoplasm of brain: Secondary | ICD-10-CM

## 2021-12-23 DIAGNOSIS — Z510 Encounter for antineoplastic radiation therapy: Secondary | ICD-10-CM | POA: Diagnosis not present

## 2021-12-23 NOTE — Progress Notes (Signed)
[facility] [place], [place] [address] [phone]   Interval Evaluation  Date of Service: 12/23/21 Patient Name: Richard Santos Patient MRN: [id number] Patient DOB: May 14, 1961 Provider: Ventura Sellers, MD  Identifying Statement:  Richard Santos is a 61 y.o. male with Metastatic cancer to brain [facility])  Primary Cancer:  Oncologic History: Oncology History  Small cell lung cancer, left upper lobe ([place])  04/18/2020 Initial Diagnosis   Small cell lung cancer, left upper lobe ([place])   05/06/2020 - 05/29/2021 Chemotherapy          12/25/2020 Cancer Staging   Staging form: Lung, AJCC 8th Edition - Clinical: Stage IVB (cT2b, cN2, cM1c) - Signed by Curt Bears, MD on 12/25/2020    06/24/2021 -  Chemotherapy   Patient is on Treatment Plan : LUNG SMALL CELL Carboplatin D1 / Etoposide D1-3 q21d      CNS Oncologic History 09/11/20: Completes PCI irradiation 05/30/21: Salvage SRS x5 Richard Santos)  Interval History: Richard Santos presents today for follow after recent MRI brain.  He and his wife describe modest improvement in overall function since starting the decadron.  He continues to experience issues with fatigue, short term memory, cognitive processing and balance.  He is walking on his own with assistance, however.  Most needs are taken care of independently but his wife has taken over driving.  Main complaint today is ongoing nausea and vomiting, which has not been quelled with zofran and decadron.  Nausea and vomiting is more intense than when he was on chemotherapy, last in November.      Decadron 12/02/21: 4mg   (12/02/21) He and his wife describe increased lethargy, generalized weakness, poor appetite.  He has barely been eating since late last week.  He seems to exhibit increased confusion as well.  Wife describes several episodes of right hand twitching, which stopped after dosing the Keppra.  However, his lethargy and confusion  seem to have started at the same time as the [place].    H+P (11/13/21) Patient presents today to discuss new neurologic symptoms.  He describes 3 weeks history of episodes of "flashing blue and yellow lights" in the right side of his vision, lasting for a few minutes.  Episodes do persist when right eye is closed, or at least appear to.  Frequency is "multiple times per day, maybe even once per hour".  He had one episode of confusion which may have followed one of the events.  Also describes feeling of numbness affecting right side of mouth and lower face.  These may be associated with the visual symptoms, onset was the same time period.  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  Recent MRI demonstrates progressive findings, was reviewed earlier this week with radiation oncology.  Medications: Current Outpatient Medications on File Prior to Visit  Medication Sig Dispense Refill   aspirin EC 81 MG tablet Take 81 mg by mouth daily. Swallow whole.     Budeson-Glycopyrrol-Formoterol (BREZTRI AEROSPHERE) 160-9-4.8 MCG/ACT AERO Inhale 2 puffs into the lungs in the morning and at bedtime. 10.7 g 11   busPIRone (BUSPAR) 10 MG tablet Take 10 mg by mouth 2 (two) times daily.     dexamethasone (DECADRON) 4 MG tablet Take 1 tablet (4 mg total) by mouth daily. 30 tablet 3   divalproex (DEPAKOTE) 250 MG DR tablet Take 1 tablet (250 mg total) by mouth 2 (two) times daily. [place]  tablet 2   famotidine (PEPCID) 20 MG tablet Take 1 tablet (20 mg total) by mouth 2 (two) times daily. 60 tablet 2   lovastatin (MEVACOR) 20 MG tablet Take 1 tablet (20 mg total) by mouth at bedtime. 90 tablet 3   Multiple Vitamin (MULTIVITAMIN) capsule Take 1 capsule by mouth daily.     ondansetron (ZOFRAN-ODT) 8 MG disintegrating tablet TAKE 1 TABLET BY MOUTH EVERY 8 HOURS AS NEEDED FOR NAUSEA OR VOMITING. 30 tablet 2   pantoprazole (PROTONIX) 40 MG tablet Take 40 mg by mouth every morning.      prochlorperazine (COMPAZINE) 10 MG tablet TAKE 1 TABLET BY MOUTH EVERY 6 HOURS AS NEEDED 30 tablet 2   sodium bicarbonate 650 MG tablet TAKE 1 TABLET BY MOUTH TWICE A DAY 180 tablet 1   traMADol (ULTRAM) 50 MG tablet TAKE 1/2 TABLET BY MOUTH TWICE A DAY AS NEEDED FOR PAIN 60 tablet 2   Current Facility-Administered Medications on File Prior to Visit  Medication Dose Route Frequency Provider Last Rate Last Admin   heparin lock flush 100 unit/mL  500 Units Intracatheter Daily PRN Curt Bears, MD       sodium chloride flush (NS) 0.9 % injection 10 mL  10 mL Intracatheter PRN Curt Bears, MD       sodium chloride flush (NS) 0.9 % injection 10 mL  10 mL Intracatheter PRN Curt Bears, MD   10 mL at 07/16/21 1606    Allergies:  Allergies  Allergen Reactions   Bee Venom Swelling   Elemental Sulfur Other (See Comments)    Acute renal failure   Other Other (See Comments)    Chemo medicine- patient not sure about name - swelling & rashes   Sulfa Antibiotics    Namenda [Memantine] Nausea Only    Nausea and fatigue   Past Medical History:  Past Medical History:  Diagnosis Date   Allergic rhinitis    Allergy    Anxiety    Chronic low back pain    DDD (degenerative disc disease), lumbar    ED (erectile dysfunction)    Finger injury    cut pads off 3rd and 4th finger left hand/due to lawn mower accident   GERD (gastroesophageal reflux disease)    Heart murmur    as child only-    History of kidney stones    HTN (hypertension) 08/20/2016   Hyperlipidemia    Incomplete right bundle branch block    Prostate cancer ([place]) dx 10/02/14   stage T1c   Sigmoid diverticulosis    Small cell lung cancer ([place]) 03/2020   Wears glasses    Past Surgical History:  Past Surgical History:  Procedure Laterality Date   BRONCHIAL BIOPSY  04/12/2020   Procedure: BRONCHIAL BIOPSIES;  Surgeon: Marshell Garfinkel, MD;  Location: [facility];  Service: Cardiopulmonary;;   BRONCHIAL BRUSHINGS   04/12/2020   Procedure: BRONCHIAL BRUSHINGS;  Surgeon: Marshell Garfinkel, MD;  Location: [facility];  Service: Cardiopulmonary;;   BRONCHIAL NEEDLE ASPIRATION BIOPSY  04/12/2020   Procedure: BRONCHIAL NEEDLE ASPIRATION BIOPSIES;  Surgeon: Marshell Garfinkel, MD;  Location: [facility];  Service: Cardiopulmonary;;   BRONCHIAL WASHINGS  04/12/2020   Procedure: BRONCHIAL WASHINGS;  Surgeon: Marshell Garfinkel, MD;  Location: [facility];  Service: Cardiopulmonary;;   COLONOSCOPY     COLONOSCOPY W/ POLYPECTOMY  04-19-2012   ENDOBRONCHIAL ULTRASOUND N/A 04/12/2020   Procedure: ENDOBRONCHIAL ULTRASOUND;  Surgeon: Marshell Garfinkel, MD;  Location: [facility];  Service: Cardiopulmonary;  Laterality: N/A;  ESOPHAGOGASTRODUODENOSCOPY  08-05-2010   HEMOSTASIS CONTROL  04/12/2020   Procedure: HEMOSTASIS CONTROL;  Surgeon: Marshell Garfinkel, MD;  Location: [facility];  Service: Cardiopulmonary;;   IR IMAGING GUIDED PORT INSERTION  05/31/2020   POLYPECTOMY     PROSTATE BIOPSY  10/02/14   RADIOACTIVE SEED IMPLANT N/A 01/30/2015   Procedure: RADIOACTIVE SEED IMPLANT;  Surgeon: Rana Snare, MD;  Location: [facility] [address];  Service: Urology;  Laterality: N/A;   UPPER GASTROINTESTINAL ENDOSCOPY  04/03/2021   VIDEO BRONCHOSCOPY N/A 04/12/2020   Procedure: VIDEO BRONCHOSCOPY WITHOUT FLUORO;  Surgeon: Marshell Garfinkel, MD;  Location: [facility];  Service: Cardiopulmonary;  Laterality: N/A;   Social History:  Social History   Socioeconomic History   Marital status: Married    Spouse name: Not on file   Number of children: 0   Years of education: Not on file   Highest education level: Not on file  Occupational History   Occupation: Counselling psychologist: [employer]  Tobacco Use   Smoking status: Every Day    Packs/day: 0.50    Years: 25.00    Pack years: 12.50    Types: Cigarettes   Smokeless tobacco: Never   Tobacco comments:    smokes 10 cigs per day 08/26/21  Vaping Use   Vaping  Use: Former  Substance and Sexual Activity   Alcohol use: Yes    Alcohol/week: 49.0 standard drinks    Types: 25 Cans of beer, 24 Standard drinks or equivalent per week    Comment: 5 beer per day   Drug use: No   Sexual activity: Not on file  Other Topics Concern   Not on file  Social History Narrative   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 He is married with no biological children.   Social Determinants of Health   Financial Resource Strain: Not on file  Food Insecurity: Not on file  Transportation Needs: Not on file  Physical Activity: Not on file  Stress: Not on file  Social Connections: Not on file  Intimate Partner Violence: Not on file   Family History:  Family History  Problem Relation Age of Onset   Prostate cancer Father    Heart disease Father    Heart disease Mother    Breast cancer Sister    Heart disease Other    Colon cancer Neg Hx    Colon polyps Neg Hx    Esophageal cancer Neg Hx    Rectal cancer Neg Hx    Stomach cancer Neg Hx     Review of Systems: Constitutional: Doesn't report fevers, chills or abnormal weight loss Eyes: Doesn't report blurriness of vision Ears, nose, mouth, throat, and face: Doesn't report sore throat Respiratory: Doesn't report cough, dyspnea or wheezes Cardiovascular: Doesn't report palpitation, chest discomfort  Gastrointestinal:  Doesn't report nausea, constipation, diarrhea GU: Doesn't report incontinence Skin: Doesn't report skin rashes Neurological: Per HPI Musculoskeletal: Doesn't report joint pain Behavioral/Psych: Doesn't report anxiety  Physical Exam: Vitals:   12/23/21 0938  BP: 107/88  Pulse: (!) 106  Resp: 17  Temp: (!) 97.5 F (36.4 C)  SpO2: 100%   KPS: 70. General: Alert, cooperative, pleasant, in no acute distress Head: Normal EENT: No conjunctival injection or scleral icterus.  Lungs: Resp effort normal Cardiac: Regular rate Abdomen: Non-distended abdomen Skin:  No rashes cyanosis or petechiae. Extremities: No clubbing or edema  Neurologic Exam: Mental Status: Awake and alert, attentive to examiner. Oriented to self  and environment. Language is fluent with intact comprehension.  Noted psychomotor slowing today. Cranial Nerves: Visual acuity is grossly normal. Visual fields are full. Extra-ocular movements intact. No ptosis. Face is symmetric Motor: Tone and bulk are normal. Power is full in both arms and legs. Reflexes are symmetric, no pathologic reflexes present.  Sensory: Intact to light touch Gait: Normal.   Labs: I have reviewed the data as listed    Component Value Date/Time   NA 135 12/19/2021 1302   K 4.0 12/19/2021 1302   CL 97 (L) 12/19/2021 1302   CO2 30 12/19/2021 1302   GLUCOSE 102 (H) 12/19/2021 1302   BUN 30 (H) 12/19/2021 1302   CREATININE 0.86 12/19/2021 1302   CALCIUM 9.6 12/19/2021 1302   PROT 6.9 12/19/2021 1302   ALBUMIN 4.3 12/19/2021 1302   AST 39 12/19/2021 1302   ALT 62 (H) 12/19/2021 1302   ALKPHOS 56 12/19/2021 1302   BILITOT 0.4 12/19/2021 1302   GFRNONAA >60 12/19/2021 1302   GFRAA >60 08/08/2020 1203   Lab Results  Component Value Date   WBC 7.3 12/19/2021   NEUTROABS 5.9 12/19/2021   HGB 11.3 (L) 12/19/2021   HCT 32.5 (L) 12/19/2021   MCV 104.2 (H) 12/19/2021   PLT 166 12/19/2021   Imaging:  [place] Clinician Interpretation: I have personally reviewed the CNS images as listed.  My interpretation, in the context of the patient's clinical presentation, is progressive disease  CT Chest W Contrast  Result Date: 12/21/2021 CLINICAL DATA:  Lung cancer, metastatic disease evaluation, chemotherapy and immunotherapy complete EXAM: CT CHEST, ABDOMEN, AND PELVIS WITH CONTRAST TECHNIQUE: Multidetector CT imaging of the chest, abdomen and pelvis was performed following the standard protocol during bolus administration of intravenous contrast. RADIATION DOSE REDUCTION: This exam was performed according to the  departmental dose-optimization program which includes automated exposure control, adjustment of the mA and/or kV according to patient size and/or use of iterative reconstruction technique. CONTRAST:  172mL OMNIPAQUE IOHEXOL 300 MG/ML SOLN, additional oral enteric contrast COMPARISON:  10/20/2021 FINDINGS: CT CHEST FINDINGS Cardiovascular: Right chest port catheter. Aortic atherosclerosis. Normal heart size. Three-vessel coronary artery calcifications. No pericardial effusion. Mediastinum/Nodes: No enlarged mediastinal, hilar, or axillary lymph nodes. Thyroid gland, trachea, and esophagus demonstrate no significant findings. Lungs/Pleura: Unchanged post treatment appearance of the left chest, with perihilar and paramedian fibrosis, consolidation, and bronchiectasis. Unchanged cavitary lesion of the peripheral left upper lobe measuring 2.4 x 1.4 cm (series 5, image 40). No pleural effusion or pneumothorax. Musculoskeletal: No chest wall mass. CT ABDOMEN PELVIS FINDINGS Hepatobiliary: No solid liver abnormality is seen. No gallstones, gallbladder wall thickening, or biliary dilatation. Pancreas: Unremarkable. No pancreatic ductal dilatation or surrounding inflammatory changes. Spleen: Normal in size without significant abnormality. Adrenals/Urinary Tract: Unchanged post treatment appearance of the left adrenal gland, without evidence of recurrent nodule (series 2, image 65). The right adrenal gland is normal. Kidneys are normal, without renal calculi, solid lesion, or hydronephrosis. Bladder is unremarkable. Stomach/Bowel: Stomach is within normal limits. Appendix appears normal. No evidence of bowel wall thickening, distention, or inflammatory changes. Moderate burden of stool throughout the colon and rectum. Vascular/Lymphatic: Aortic atherosclerosis. No enlarged abdominal or pelvic lymph nodes. Reproductive: Prostate brachytherapy. Other: No abdominal wall hernia or abnormality. No ascites. Musculoskeletal: No acute  osseous findings. Unchanged, scattered sclerotic osseous metastatic lesions, including of the sternal body (series 7, image 91), the T6, T7, and T8 vertebral bodies, and bilateral proximal femurs (series 6, image 63, 54). IMPRESSION: 1. Unchanged post treatment  appearance of the left chest, with perihilar and paramedian fibrosis, consolidation, and bronchiectasis. 2. Unchanged cavitary lesion of the peripheral left upper lobe measuring 2.4 x 1.4 cm. 3. Unchanged post treatment appearance of the left adrenal gland, without evidence of recurrent nodule. 4. No evidence of new metastatic disease in the chest, abdomen, or pelvis. 5. Unchanged, scattered sclerotic osseous metastatic lesions. No new osseous lesions. 6. Prostate brachytherapy. 7. Coronary artery disease. Aortic Atherosclerosis (ICD10-I70.0). Electronically Signed   By: Delanna Ahmadi M.D.   On: 12/21/2021 07:48   MR Brain W Wo Contrast  Result Date: 12/19/2021 CLINICAL DATA:  Metastatic lung cancer. EXAM: MRI HEAD WITHOUT AND WITH CONTRAST TECHNIQUE: Multiplanar, multiecho pulse sequences of the brain and surrounding structures were obtained without and with intravenous contrast. CONTRAST:  24mL GADAVIST GADOBUTROL 1 MMOL/ML IV SOLN COMPARISON:  Head MRI 11/06/2021 FINDINGS: Brain: There are innumerable solidly enhancing lesions throughout both cerebral hemispheres as well as cerebellum and brainstem which reflect a combination of enlargement of the enhancing foci on the prior MRI and development of new lesions. The largest measures 2 cm in the mesial right temporal lobe. Brainstem and cerebellar lesions measure up to 1.2 cm. There are blood products associated with some of the lesions, and the majority demonstrate restricted diffusion. Associated vasogenic edema is mild in the posterior fossa and up to moderate in the left greater than right cerebral hemispheres without midline shift or other significant mass effect. No acute large territory infarct or  extra-axial fluid collection is evident. There is mild cerebral atrophy. Vascular: Major intracranial vascular flow voids are preserved. Skull and upper cervical spine: Unremarkable bone marrow signal. Sinuses/Orbits: Unremarkable orbits. Trace mucosal thickening in the left maxillary sinus. Moderate right and small left mastoid effusions. Other: None. IMPRESSION: Innumerable brain metastases with mild-to-moderate edema. No significant mass effect. Electronically Signed   By: Logan Bores M.D.   On: 12/19/2021 13:59   CT Abdomen Pelvis W Contrast  Result Date: 12/21/2021 CLINICAL DATA:  Lung cancer, metastatic disease evaluation, chemotherapy and immunotherapy complete EXAM: CT CHEST, ABDOMEN, AND PELVIS WITH CONTRAST TECHNIQUE: Multidetector CT imaging of the chest, abdomen and pelvis was performed following the standard protocol during bolus administration of intravenous contrast. RADIATION DOSE REDUCTION: This exam was performed according to the departmental dose-optimization program which includes automated exposure control, adjustment of the mA and/or kV according to patient size and/or use of iterative reconstruction technique. CONTRAST:  151mL OMNIPAQUE IOHEXOL 300 MG/ML SOLN, additional oral enteric contrast COMPARISON:  10/20/2021 FINDINGS: CT CHEST FINDINGS Cardiovascular: Right chest port catheter. Aortic atherosclerosis. Normal heart size. Three-vessel coronary artery calcifications. No pericardial effusion. Mediastinum/Nodes: No enlarged mediastinal, hilar, or axillary lymph nodes. Thyroid gland, trachea, and esophagus demonstrate no significant findings. Lungs/Pleura: Unchanged post treatment appearance of the left chest, with perihilar and paramedian fibrosis, consolidation, and bronchiectasis. Unchanged cavitary lesion of the peripheral left upper lobe measuring 2.4 x 1.4 cm (series 5, image 40). No pleural effusion or pneumothorax. Musculoskeletal: No chest wall mass. CT ABDOMEN PELVIS FINDINGS  Hepatobiliary: No solid liver abnormality is seen. No gallstones, gallbladder wall thickening, or biliary dilatation. Pancreas: Unremarkable. No pancreatic ductal dilatation or surrounding inflammatory changes. Spleen: Normal in size without significant abnormality. Adrenals/Urinary Tract: Unchanged post treatment appearance of the left adrenal gland, without evidence of recurrent nodule (series 2, image 65). The right adrenal gland is normal. Kidneys are normal, without renal calculi, solid lesion, or hydronephrosis. Bladder is unremarkable. Stomach/Bowel: Stomach is within normal limits. Appendix appears normal. No  evidence of bowel wall thickening, distention, or inflammatory changes. Moderate burden of stool throughout the colon and rectum. Vascular/Lymphatic: Aortic atherosclerosis. No enlarged abdominal or pelvic lymph nodes. Reproductive: Prostate brachytherapy. Other: No abdominal wall hernia or abnormality. No ascites. Musculoskeletal: No acute osseous findings. Unchanged, scattered sclerotic osseous metastatic lesions, including of the sternal body (series 7, image 91), the T6, T7, and T8 vertebral bodies, and bilateral proximal femurs (series 6, image 63, 54). IMPRESSION: 1. Unchanged post treatment appearance of the left chest, with perihilar and paramedian fibrosis, consolidation, and bronchiectasis. 2. Unchanged cavitary lesion of the peripheral left upper lobe measuring 2.4 x 1.4 cm. 3. Unchanged post treatment appearance of the left adrenal gland, without evidence of recurrent nodule. 4. No evidence of new metastatic disease in the chest, abdomen, or pelvis. 5. Unchanged, scattered sclerotic osseous metastatic lesions. No new osseous lesions. 6. Prostate brachytherapy. 7. Coronary artery disease. Aortic Atherosclerosis (ICD10-I70.0). Electronically Signed   By: Delanna Ahmadi M.D.   On: 12/21/2021 07:48     Assessment/Plan Metastatic cancer to brain [facility])  [name] presents today with  ongoing encephalopathy secondary to disease burden.  MRI brain demonstrates clear progression of disease, with notable increase in volume of enhancement involving greater than 20 metastases.    We discussed difficult situation with aggressive CNS pattern; salvage whole brain radiation could be offered, but risks would include advancement of cognitive impairment, radionecrosis given heavy pre-treatment, and potential lack of efficacy.  In addition, additional systemic therapy would be needed to prevent further CNS seeding.    Presence of refractory nausea and vomiting, with multiple brainstem metastases, is suggestive of central etiology.  In this circumstance, radiation may provide palliation for these symptoms.    Additional consideration was given to hospice referral, he will continue these discussions with palliative care today.  Decadron can stay at 4mg  daily, Depakote at 250mg  BID.  If consolidation immunotherapy is warranted, decadron may be decreased to 2mg  daily.   We appreciate the opportunity to participate in the care of Richard Santos.  He should return to clinic in 4 weeks, following planned WBRT, for clinical evaluation and medication adjustment.  All questions were answered. The patient knows to call the clinic with any problems, questions or concerns. No barriers to learning were detected.  The total time spent in the encounter was 40 minutes and more than 50% was on counseling and review of test results   Ventura Sellers, MD Medical Director of Neuro-Oncology [facility] [place] 12/23/21 9:33 AM

## 2021-12-23 NOTE — Progress Notes (Signed)
Oncology Nurse Navigator Documentation  Oncology Nurse Navigator Flowsheets 12/23/2021 04/23/2020  Abnormal Finding Date - 04/11/2020  Confirmed Diagnosis Date - 04/12/2020  Diagnosis Status - Confirmed Diagnosis Complete  Planned Course of Treatment - Chemotherapy;Radiation  Phase of Treatment - Radiation  Navigator Follow Up Date: 01/12/2022 04/23/2020  Navigator Follow Up Reason: Review Note Appointment Review  Navigator Location [facility]  Navigator Encounter Type Clinic/MDC;Follow-up Appt Clinic/MDC;Other:  Patient Visit Type Follow-up;MedOnc MedOnc;Other  Treatment Phase Treatment Pre-Tx/Tx Discussion;Other  Estate manager/land agent Other Newly Diagnosed Cancer Education;Other  Interventions Psycho-Social Support;Education Coordination of Care;Education;Psycho-Social Support  Acuity Level 2-Minimal Needs (1-2 Barriers Identified) Level 2-Minimal Needs (1-2 Barriers Identified)  Coordination of Care - Other  Education Method Verbal Verbal;Written  Time Spent with Patient 79 48

## 2021-12-23 NOTE — Progress Notes (Signed)
[place] [phone]   Fax:([phone]) [place], [place] [address]  DIAGNOSIS: Extensive stage (T2b, N2, M1c) small cell lung cancer presented with left upper lobe pulmonary nodule in addition to left hilar mass with mediastinal invasion and solitary brain metastasis diagnosed in May 2021  PRIOR THERAPY:  1) Palliative radiation to the left hilar and mediastinal lymphadenopathy under the care of Dr. Lisbeth Renshaw. 2) Cranial irradiation from 08/26/20-09/06/20 under the care of Dr. Lisbeth Renshaw 3) Palliative systemic chemotherapy with carboplatin for AUC of 5 on day 1, etoposide 100 mg/M2 on days 1, 2 and 3 as well as Imfinzi 1500 mg IV every 3 weeks with the chemotherapy.  First dose of chemotherapy May 06, 2020.  The patient will receive treatment with Cosela on the days of his chemotherapy.  Status post 14 cycles.  Starting from cycle #5 he started maintenance immunotherapy with Imfinzi 1500 mg IV every 4 weeks. Discontinued due to disease progression. Last dose 05/29/21.  4) Carboplatin for an AUC of 5 and etoposide 100 mg/M2 on days 1, 2, and 3 IV every 3 weeks Neulasta or Cosela.  First dose expected on 06/24/2021.  Status post 6 cycles.   CURRENT THERAPY: Observation.  INTERVAL HISTORY: Richard Santos 61 y.o. male returns to the clinic today for follow-up visit accompanied by his wife.  The patient has more increasing fatigue and weakness as well as recent falls and confusion.  He had MRI of the brain performed last week that showed significant disease progression in the brain.  He was seen by Dr. Mickeal Skinner and started on treatment with Decadron.  He is also expected to start whole brain irradiation under the care of Dr. Lisbeth Renshaw today.  He denied having any current chest pain, shortness of breath, cough or hemoptysis.  He denied having any fever or chills.  He has intermittent nausea improved with his antiemetics.   He has no vomiting, abdominal pain, diarrhea or constipation.  He has no current headache or visual changes but has some balance issue.  The patient had repeat CT scan of the chest, abdomen pelvis performed recently and he is here for evaluation and discussion of his scan results.  MEDICAL HISTORY: Past Medical History:  Diagnosis Date   Allergic rhinitis    Allergy    Anxiety    Chronic low back pain    DDD (degenerative disc disease), lumbar    ED (erectile dysfunction)    Finger injury    cut pads off 3rd and 4th finger left hand/due to lawn mower accident   GERD (gastroesophageal reflux disease)    Heart murmur    as child only-    History of kidney stones    HTN (hypertension) 08/20/2016   Hyperlipidemia    Incomplete right bundle branch block    Prostate cancer ([facility]) dx 10/02/14   stage T1c   Sigmoid diverticulosis    Small cell lung cancer ([facility]) 03/2020   Wears glasses     ALLERGIES:  is allergic to bee venom, elemental sulfur, other, sulfa antibiotics, and namenda [memantine].  MEDICATIONS:  Current Outpatient Medications  Medication Sig Dispense Refill   aspirin EC 81 MG tablet Take 81 mg by mouth daily. Swallow whole.     Budeson-Glycopyrrol-Formoterol (BREZTRI AEROSPHERE) 160-9-4.8 MCG/ACT AERO Inhale 2 puffs into the lungs in the morning and at bedtime. 10.7 g 11   busPIRone (BUSPAR) 10 MG tablet  Take 10 mg by mouth 2 (two) times daily.     dexamethasone (DECADRON) 4 MG tablet Take 1 tablet (4 mg total) by mouth daily. 30 tablet 3   divalproex (DEPAKOTE) 250 MG DR tablet Take 1 tablet (250 mg total) by mouth 2 (two) times daily. 60 tablet 2   famotidine (PEPCID) 20 MG tablet Take 1 tablet (20 mg total) by mouth 2 (two) times daily. 60 tablet 2   lovastatin (MEVACOR) 20 MG tablet Take 1 tablet (20 mg total) by mouth at bedtime. 90 tablet 3   Multiple Vitamin (MULTIVITAMIN) capsule Take 1 capsule by mouth daily.     ondansetron (ZOFRAN-ODT) 8 MG disintegrating tablet  TAKE 1 TABLET BY MOUTH EVERY 8 HOURS AS NEEDED FOR NAUSEA OR VOMITING. 30 tablet 2   pantoprazole (PROTONIX) 40 MG tablet Take 40 mg by mouth every morning.     prochlorperazine (COMPAZINE) 10 MG tablet TAKE 1 TABLET BY MOUTH EVERY 6 HOURS AS NEEDED 30 tablet 2   sodium bicarbonate 650 MG tablet TAKE 1 TABLET BY MOUTH TWICE A DAY 180 tablet 1   traMADol (ULTRAM) 50 MG tablet TAKE 1/2 TABLET BY MOUTH TWICE A DAY AS NEEDED FOR PAIN 60 tablet 2   No current facility-administered medications for this visit.   Facility-Administered Medications Ordered in Other Visits  Medication Dose Route Frequency Provider Last Rate Last Admin   heparin lock flush 100 unit/mL  500 Units Intracatheter Daily PRN Curt Bears, MD       sodium chloride flush (NS) 0.9 % injection 10 mL  10 mL Intracatheter PRN Curt Bears, MD       sodium chloride flush (NS) 0.9 % injection 10 mL  10 mL Intracatheter PRN Curt Bears, MD   10 mL at 07/16/21 1606    SURGICAL HISTORY:  Past Surgical History:  Procedure Laterality Date   BRONCHIAL BIOPSY  04/12/2020   Procedure: BRONCHIAL BIOPSIES;  Surgeon: Marshell Garfinkel, MD;  Location: [facility];  Service: Cardiopulmonary;;   BRONCHIAL BRUSHINGS  04/12/2020   Procedure: BRONCHIAL BRUSHINGS;  Surgeon: Marshell Garfinkel, MD;  Location: [facility];  Service: Cardiopulmonary;;   BRONCHIAL NEEDLE ASPIRATION BIOPSY  04/12/2020   Procedure: BRONCHIAL NEEDLE ASPIRATION BIOPSIES;  Surgeon: Marshell Garfinkel, MD;  Location: [facility];  Service: Cardiopulmonary;;   BRONCHIAL WASHINGS  04/12/2020   Procedure: BRONCHIAL WASHINGS;  Surgeon: Marshell Garfinkel, MD;  Location: [facility];  Service: Cardiopulmonary;;   COLONOSCOPY     COLONOSCOPY W/ POLYPECTOMY  04-19-2012   ENDOBRONCHIAL ULTRASOUND N/A 04/12/2020   Procedure: ENDOBRONCHIAL ULTRASOUND;  Surgeon: Marshell Garfinkel, MD;  Location: [facility];  Service: Cardiopulmonary;  Laterality: N/A;   ESOPHAGOGASTRODUODENOSCOPY   08-05-2010   HEMOSTASIS CONTROL  04/12/2020   Procedure: HEMOSTASIS CONTROL;  Surgeon: Marshell Garfinkel, MD;  Location: [facility];  Service: Cardiopulmonary;;   IR IMAGING GUIDED PORT INSERTION  05/31/2020   POLYPECTOMY     PROSTATE BIOPSY  10/02/14   RADIOACTIVE SEED IMPLANT N/A 01/30/2015   Procedure: RADIOACTIVE SEED IMPLANT;  Surgeon: Rana Snare, MD;  Location: [facility];  Service: Urology;  Laterality: N/A;   UPPER GASTROINTESTINAL ENDOSCOPY  04/03/2021   VIDEO BRONCHOSCOPY N/A 04/12/2020   Procedure: VIDEO BRONCHOSCOPY WITHOUT FLUORO;  Surgeon: Marshell Garfinkel, MD;  Location: [facility];  Service: Cardiopulmonary;  Laterality: N/A;    REVIEW OF SYSTEMS:  Constitutional: positive for fatigue Eyes: negative Ears, nose, mouth, throat, and face: negative Respiratory: negative Cardiovascular: negative Gastrointestinal: negative Genitourinary:negative Integument/breast: negative Hematologic/lymphatic: negative Musculoskeletal:positive  for muscle weakness Neurological: positive for coordination problems and weakness Behavioral/Psych: negative Endocrine: negative Allergic/Immunologic: negative   PHYSICAL EXAMINATION: General appearance: alert, cooperative, fatigued, and no distress Head: Normocephalic, without obvious abnormality, atraumatic Neck: no adenopathy, no JVD, supple, symmetrical, trachea midline, and thyroid not enlarged, symmetric, no tenderness/mass/nodules Lymph nodes: Cervical, supraclavicular, and axillary nodes normal. Resp: clear to auscultation bilaterally Back: symmetric, no curvature. ROM normal. No CVA tenderness. Cardio: regular rate and rhythm, S1, S2 normal, no murmur, click, rub or gallop GI: soft, non-tender; bowel sounds normal; no masses,  no organomegaly Extremities: extremities normal, atraumatic, no cyanosis or edema Neurologic: Alert and oriented X 3, normal strength and tone. Normal symmetric reflexes. Normal coordination and  gait  ECOG PERFORMANCE STATUS: 1 - Symptomatic but completely ambulatory  There were no vitals taken for this visit.  LABORATORY DATA: Lab Results  Component Value Date   WBC 7.3 12/19/2021   HGB 11.3 (L) 12/19/2021   HCT 32.5 (L) 12/19/2021   MCV 104.2 (H) 12/19/2021   PLT 166 12/19/2021      Chemistry      Component Value Date/Time   NA 135 12/19/2021 1302   K 4.0 12/19/2021 1302   CL 97 (L) 12/19/2021 1302   CO2 30 12/19/2021 1302   BUN 30 (H) 12/19/2021 1302   CREATININE 0.86 12/19/2021 1302      Component Value Date/Time   CALCIUM 9.6 12/19/2021 1302   ALKPHOS 56 12/19/2021 1302   AST 39 12/19/2021 1302   ALT 62 (H) 12/19/2021 1302   BILITOT 0.4 12/19/2021 1302       RADIOGRAPHIC STUDIES: CT Chest W Contrast  Result Date: 12/21/2021 CLINICAL DATA:  Lung cancer, metastatic disease evaluation, chemotherapy and immunotherapy complete EXAM: CT CHEST, ABDOMEN, AND PELVIS WITH CONTRAST TECHNIQUE: Multidetector CT imaging of the chest, abdomen and pelvis was performed following the standard protocol during bolus administration of intravenous contrast. RADIATION DOSE REDUCTION: This exam was performed according to the departmental dose-optimization program which includes automated exposure control, adjustment of the mA and/or kV according to patient size and/or use of iterative reconstruction technique. CONTRAST:  162mL OMNIPAQUE IOHEXOL 300 MG/ML SOLN, additional oral enteric contrast COMPARISON:  10/20/2021 FINDINGS: CT CHEST FINDINGS Cardiovascular: Right chest port catheter. Aortic atherosclerosis. Normal heart size. Three-vessel coronary artery calcifications. No pericardial effusion. Mediastinum/Nodes: No enlarged mediastinal, hilar, or axillary lymph nodes. Thyroid gland, trachea, and esophagus demonstrate no significant findings. Lungs/Pleura: Unchanged post treatment appearance of the left chest, with perihilar and paramedian fibrosis, consolidation, and bronchiectasis.  Unchanged cavitary lesion of the peripheral left upper lobe measuring 2.4 x 1.4 cm (series 5, image 40). No pleural effusion or pneumothorax. Musculoskeletal: No chest wall mass. CT ABDOMEN PELVIS FINDINGS Hepatobiliary: No solid liver abnormality is seen. No gallstones, gallbladder wall thickening, or biliary dilatation. Pancreas: Unremarkable. No pancreatic ductal dilatation or surrounding inflammatory changes. Spleen: Normal in size without significant abnormality. Adrenals/Urinary Tract: Unchanged post treatment appearance of the left adrenal gland, without evidence of recurrent nodule (series 2, image 65). The right adrenal gland is normal. Kidneys are normal, without renal calculi, solid lesion, or hydronephrosis. Bladder is unremarkable. Stomach/Bowel: Stomach is within normal limits. Appendix appears normal. No evidence of bowel wall thickening, distention, or inflammatory changes. Moderate burden of stool throughout the colon and rectum. Vascular/Lymphatic: Aortic atherosclerosis. No enlarged abdominal or pelvic lymph nodes. Reproductive: Prostate brachytherapy. Other: No abdominal wall hernia or abnormality. No ascites. Musculoskeletal: No acute osseous findings. Unchanged, scattered sclerotic osseous metastatic lesions, including of  the sternal body (series 7, image 91), the T6, T7, and T8 vertebral bodies, and bilateral proximal femurs (series 6, image 63, 54). IMPRESSION: 1. Unchanged post treatment appearance of the left chest, with perihilar and paramedian fibrosis, consolidation, and bronchiectasis. 2. Unchanged cavitary lesion of the peripheral left upper lobe measuring 2.4 x 1.4 cm. 3. Unchanged post treatment appearance of the left adrenal gland, without evidence of recurrent nodule. 4. No evidence of new metastatic disease in the chest, abdomen, or pelvis. 5. Unchanged, scattered sclerotic osseous metastatic lesions. No new osseous lesions. 6. Prostate brachytherapy. 7. Coronary artery disease.  Aortic Atherosclerosis (ICD10-I70.0). Electronically Signed   By: Delanna Ahmadi M.D.   On: 12/21/2021 07:48   MR Brain W Wo Contrast  Result Date: 12/19/2021 CLINICAL DATA:  Metastatic lung cancer. EXAM: MRI HEAD WITHOUT AND WITH CONTRAST TECHNIQUE: Multiplanar, multiecho pulse sequences of the brain and surrounding structures were obtained without and with intravenous contrast. CONTRAST:  19mL GADAVIST GADOBUTROL 1 MMOL/ML IV SOLN COMPARISON:  Head MRI 11/06/2021 FINDINGS: Brain: There are innumerable solidly enhancing lesions throughout both cerebral hemispheres as well as cerebellum and brainstem which reflect a combination of enlargement of the enhancing foci on the prior MRI and development of new lesions. The largest measures 2 cm in the mesial right temporal lobe. Brainstem and cerebellar lesions measure up to 1.2 cm. There are blood products associated with some of the lesions, and the majority demonstrate restricted diffusion. Associated vasogenic edema is mild in the posterior fossa and up to moderate in the left greater than right cerebral hemispheres without midline shift or other significant mass effect. No acute large territory infarct or extra-axial fluid collection is evident. There is mild cerebral atrophy. Vascular: Major intracranial vascular flow voids are preserved. Skull and upper cervical spine: Unremarkable bone marrow signal. Sinuses/Orbits: Unremarkable orbits. Trace mucosal thickening in the left maxillary sinus. Moderate right and small left mastoid effusions. Other: None. IMPRESSION: Innumerable brain metastases with mild-to-moderate edema. No significant mass effect. Electronically Signed   By: Logan Bores M.D.   On: 12/19/2021 13:59   CT Abdomen Pelvis W Contrast  Result Date: 12/21/2021 CLINICAL DATA:  Lung cancer, metastatic disease evaluation, chemotherapy and immunotherapy complete EXAM: CT CHEST, ABDOMEN, AND PELVIS WITH CONTRAST TECHNIQUE: Multidetector CT imaging of the  chest, abdomen and pelvis was performed following the standard protocol during bolus administration of intravenous contrast. RADIATION DOSE REDUCTION: This exam was performed according to the departmental dose-optimization program which includes automated exposure control, adjustment of the mA and/or kV according to patient size and/or use of iterative reconstruction technique. CONTRAST:  136mL OMNIPAQUE IOHEXOL 300 MG/ML SOLN, additional oral enteric contrast COMPARISON:  10/20/2021 FINDINGS: CT CHEST FINDINGS Cardiovascular: Right chest port catheter. Aortic atherosclerosis. Normal heart size. Three-vessel coronary artery calcifications. No pericardial effusion. Mediastinum/Nodes: No enlarged mediastinal, hilar, or axillary lymph nodes. Thyroid gland, trachea, and esophagus demonstrate no significant findings. Lungs/Pleura: Unchanged post treatment appearance of the left chest, with perihilar and paramedian fibrosis, consolidation, and bronchiectasis. Unchanged cavitary lesion of the peripheral left upper lobe measuring 2.4 x 1.4 cm (series 5, image 40). No pleural effusion or pneumothorax. Musculoskeletal: No chest wall mass. CT ABDOMEN PELVIS FINDINGS Hepatobiliary: No solid liver abnormality is seen. No gallstones, gallbladder wall thickening, or biliary dilatation. Pancreas: Unremarkable. No pancreatic ductal dilatation or surrounding inflammatory changes. Spleen: Normal in size without significant abnormality. Adrenals/Urinary Tract: Unchanged post treatment appearance of the left adrenal gland, without evidence of recurrent nodule (series 2, image 65). The  right adrenal gland is normal. Kidneys are normal, without renal calculi, solid lesion, or hydronephrosis. Bladder is unremarkable. Stomach/Bowel: Stomach is within normal limits. Appendix appears normal. No evidence of bowel wall thickening, distention, or inflammatory changes. Moderate burden of stool throughout the colon and rectum. Vascular/Lymphatic:  Aortic atherosclerosis. No enlarged abdominal or pelvic lymph nodes. Reproductive: Prostate brachytherapy. Other: No abdominal wall hernia or abnormality. No ascites. Musculoskeletal: No acute osseous findings. Unchanged, scattered sclerotic osseous metastatic lesions, including of the sternal body (series 7, image 91), the T6, T7, and T8 vertebral bodies, and bilateral proximal femurs (series 6, image 63, 54). IMPRESSION: 1. Unchanged post treatment appearance of the left chest, with perihilar and paramedian fibrosis, consolidation, and bronchiectasis. 2. Unchanged cavitary lesion of the peripheral left upper lobe measuring 2.4 x 1.4 cm. 3. Unchanged post treatment appearance of the left adrenal gland, without evidence of recurrent nodule. 4. No evidence of new metastatic disease in the chest, abdomen, or pelvis. 5. Unchanged, scattered sclerotic osseous metastatic lesions. No new osseous lesions. 6. Prostate brachytherapy. 7. Coronary artery disease. Aortic Atherosclerosis (ICD10-I70.0). Electronically Signed   By: Delanna Ahmadi M.D.   On: 12/21/2021 07:48     ASSESSMENT AND PLAN: This is a very pleasant 61 years old white male recently diagnosed with extensive stage small cell lung cancer presented with left upper lobe lung mass in addition to left hilar and mediastinal lymphadenopathy as well as solitary brain metastasis diagnosed in May 2021. He underwent palliative radiotherapy to the left hilar and mediastinal lymphadenopathy in addition to the radiotherapy to the brain metastasis.  He continues to have residual odynophagia and dysphagia from the palliative radiotherapy. He is currently undergoing systemic chemotherapy with carboplatin for AUC of 5 on day 1, etoposide 100 mg/M2 on days 1, 2 and 3 with Cosela infusion before chemotherapy.  He is status post 14 cycles of treatment  Starting from cycle #5 the patient is on treatment with maintenance Imfinzi every 4 weeks. The patient has been tolerating  his treatment well but unfortunately he has evidence for disease progression and significant SIADH.  His treatment was discontinued. He started treatment with systemic chemotherapy again with carboplatin for AUC of 5 on day 1 and 2 etoposide 100 Mg/M2 on days, 1, 2 and 3 every 3 weeks with Cosela status post 6 cycles. The patient is currently on observation and feeling fine. He had repeat CT scan of the chest, abdomen pelvis performed recently.  I personally and independently reviewed the scans and discussed the result with the patient and his wife. His scan showed no concerning findings for disease progression in the chest, abdomen and pelvis but MRI of the brain showed significant disease progression in the brain. I recommended for the patient to proceed with whole brain radiation as recommended by Dr. Lisbeth Renshaw.  He is also on Decadron for the vasogenic edema. I will see him back for follow-up visit in 2 months for evaluation and repeat CT scan of the chest, abdomen and pelvis for restaging of his disease but he was advised to call sooner if he has any concerning symptoms in the interval.  The patient voices understanding of current disease status and treatment options and is in agreement with the current care plan.  All questions were answered. The patient knows to call the clinic with any problems, questions or concerns. We can certainly see the patient much sooner if necessary.   Disclaimer: This note was dictated with voice recognition software. Similar sounding words can  inadvertently be transcribed and may not be corrected upon review.

## 2021-12-24 DIAGNOSIS — R29818 Other symptoms and signs involving the nervous system: Secondary | ICD-10-CM | POA: Diagnosis not present

## 2021-12-24 DIAGNOSIS — Z7952 Long term (current) use of systemic steroids: Secondary | ICD-10-CM | POA: Diagnosis not present

## 2021-12-24 DIAGNOSIS — K5900 Constipation, unspecified: Secondary | ICD-10-CM | POA: Diagnosis not present

## 2021-12-24 DIAGNOSIS — Z79899 Other long term (current) drug therapy: Secondary | ICD-10-CM | POA: Diagnosis not present

## 2021-12-24 DIAGNOSIS — I10 Essential (primary) hypertension: Secondary | ICD-10-CM | POA: Diagnosis not present

## 2021-12-24 DIAGNOSIS — C7931 Secondary malignant neoplasm of brain: Secondary | ICD-10-CM | POA: Insufficient documentation

## 2021-12-24 DIAGNOSIS — Z923 Personal history of irradiation: Secondary | ICD-10-CM | POA: Insufficient documentation

## 2021-12-24 DIAGNOSIS — C7951 Secondary malignant neoplasm of bone: Secondary | ICD-10-CM | POA: Diagnosis not present

## 2021-12-24 DIAGNOSIS — R410 Disorientation, unspecified: Secondary | ICD-10-CM | POA: Insufficient documentation

## 2021-12-24 DIAGNOSIS — Z9221 Personal history of antineoplastic chemotherapy: Secondary | ICD-10-CM | POA: Insufficient documentation

## 2021-12-24 DIAGNOSIS — Z8546 Personal history of malignant neoplasm of prostate: Secondary | ICD-10-CM | POA: Diagnosis not present

## 2021-12-24 DIAGNOSIS — E222 Syndrome of inappropriate secretion of antidiuretic hormone: Secondary | ICD-10-CM | POA: Diagnosis not present

## 2021-12-24 DIAGNOSIS — Z510 Encounter for antineoplastic radiation therapy: Secondary | ICD-10-CM | POA: Insufficient documentation

## 2021-12-24 DIAGNOSIS — Z7982 Long term (current) use of aspirin: Secondary | ICD-10-CM | POA: Diagnosis not present

## 2021-12-24 DIAGNOSIS — C3412 Malignant neoplasm of upper lobe, left bronchus or lung: Secondary | ICD-10-CM | POA: Insufficient documentation

## 2021-12-24 DIAGNOSIS — F17210 Nicotine dependence, cigarettes, uncomplicated: Secondary | ICD-10-CM | POA: Insufficient documentation

## 2021-12-24 DIAGNOSIS — E785 Hyperlipidemia, unspecified: Secondary | ICD-10-CM | POA: Insufficient documentation

## 2021-12-25 ENCOUNTER — Other Ambulatory Visit: Payer: Self-pay

## 2021-12-25 ENCOUNTER — Ambulatory Visit
Admission: RE | Admit: 2021-12-25 | Discharge: 2021-12-25 | Disposition: A | Discharge status: Home / Self Care | Payer: 59 | Source: Ambulatory Visit | Attending: Radiation Oncology | Admitting: Radiation Oncology

## 2021-12-25 DIAGNOSIS — Z79899 Other long term (current) drug therapy: Secondary | ICD-10-CM | POA: Insufficient documentation

## 2021-12-25 DIAGNOSIS — C7951 Secondary malignant neoplasm of bone: Secondary | ICD-10-CM | POA: Insufficient documentation

## 2021-12-25 DIAGNOSIS — Z7982 Long term (current) use of aspirin: Secondary | ICD-10-CM | POA: Insufficient documentation

## 2021-12-25 DIAGNOSIS — E222 Syndrome of inappropriate secretion of antidiuretic hormone: Secondary | ICD-10-CM | POA: Insufficient documentation

## 2021-12-25 DIAGNOSIS — Z87891 Personal history of nicotine dependence: Secondary | ICD-10-CM | POA: Insufficient documentation

## 2021-12-25 DIAGNOSIS — Z510 Encounter for antineoplastic radiation therapy: Secondary | ICD-10-CM | POA: Diagnosis not present

## 2021-12-25 DIAGNOSIS — Z7952 Long term (current) use of systemic steroids: Secondary | ICD-10-CM | POA: Insufficient documentation

## 2021-12-25 DIAGNOSIS — C3412 Malignant neoplasm of upper lobe, left bronchus or lung: Secondary | ICD-10-CM | POA: Insufficient documentation

## 2021-12-25 DIAGNOSIS — C7931 Secondary malignant neoplasm of brain: Secondary | ICD-10-CM | POA: Insufficient documentation

## 2021-12-26 ENCOUNTER — Ambulatory Visit
Admission: RE | Admit: 2021-12-26 | Discharge: 2021-12-26 | Disposition: A | Discharge status: Home / Self Care | Payer: 59 | Source: Ambulatory Visit | Attending: Radiation Oncology | Admitting: Radiation Oncology

## 2021-12-26 DIAGNOSIS — Z510 Encounter for antineoplastic radiation therapy: Secondary | ICD-10-CM | POA: Diagnosis not present

## 2021-12-29 ENCOUNTER — Encounter: Payer: Self-pay | Admitting: Internal Medicine

## 2021-12-29 ENCOUNTER — Other Ambulatory Visit: Payer: Self-pay

## 2021-12-29 ENCOUNTER — Ambulatory Visit
Admission: RE | Admit: 2021-12-29 | Discharge: 2021-12-29 | Disposition: A | Discharge status: Home / Self Care | Payer: 59 | Source: Ambulatory Visit | Attending: Radiation Oncology | Admitting: Radiation Oncology

## 2021-12-29 ENCOUNTER — Encounter: Payer: Self-pay | Admitting: Nurse Practitioner

## 2021-12-29 ENCOUNTER — Inpatient Hospital Stay: Payer: 59 | Attending: Internal Medicine | Admitting: Nurse Practitioner

## 2021-12-29 VITALS — BP 112/76 | HR 95 | Temp 97.7°F | Resp 18 | Ht 72.0 in

## 2021-12-29 DIAGNOSIS — Z515 Encounter for palliative care: Secondary | ICD-10-CM

## 2021-12-29 DIAGNOSIS — E785 Hyperlipidemia, unspecified: Secondary | ICD-10-CM | POA: Insufficient documentation

## 2021-12-29 DIAGNOSIS — K5900 Constipation, unspecified: Secondary | ICD-10-CM | POA: Insufficient documentation

## 2021-12-29 DIAGNOSIS — Z9221 Personal history of antineoplastic chemotherapy: Secondary | ICD-10-CM | POA: Insufficient documentation

## 2021-12-29 DIAGNOSIS — C7931 Secondary malignant neoplasm of brain: Secondary | ICD-10-CM | POA: Diagnosis not present

## 2021-12-29 DIAGNOSIS — Z79899 Other long term (current) drug therapy: Secondary | ICD-10-CM | POA: Insufficient documentation

## 2021-12-29 DIAGNOSIS — R530 Neoplastic (malignant) related fatigue: Secondary | ICD-10-CM | POA: Diagnosis not present

## 2021-12-29 DIAGNOSIS — Z8546 Personal history of malignant neoplasm of prostate: Secondary | ICD-10-CM | POA: Insufficient documentation

## 2021-12-29 DIAGNOSIS — Z7982 Long term (current) use of aspirin: Secondary | ICD-10-CM | POA: Insufficient documentation

## 2021-12-29 DIAGNOSIS — Z923 Personal history of irradiation: Secondary | ICD-10-CM | POA: Insufficient documentation

## 2021-12-29 DIAGNOSIS — F17210 Nicotine dependence, cigarettes, uncomplicated: Secondary | ICD-10-CM | POA: Insufficient documentation

## 2021-12-29 DIAGNOSIS — R29818 Other symptoms and signs involving the nervous system: Secondary | ICD-10-CM | POA: Insufficient documentation

## 2021-12-29 DIAGNOSIS — Z510 Encounter for antineoplastic radiation therapy: Secondary | ICD-10-CM | POA: Diagnosis not present

## 2021-12-29 DIAGNOSIS — R110 Nausea: Secondary | ICD-10-CM

## 2021-12-29 DIAGNOSIS — I10 Essential (primary) hypertension: Secondary | ICD-10-CM | POA: Insufficient documentation

## 2021-12-29 DIAGNOSIS — C3412 Malignant neoplasm of upper lobe, left bronchus or lung: Secondary | ICD-10-CM | POA: Diagnosis not present

## 2021-12-29 DIAGNOSIS — Z7189 Other specified counseling: Secondary | ICD-10-CM

## 2021-12-29 DIAGNOSIS — R410 Disorientation, unspecified: Secondary | ICD-10-CM | POA: Insufficient documentation

## 2021-12-29 NOTE — Progress Notes (Signed)
[facility]  [phone] [phone]) [phone]   Name: Richard Santos Date: 12/29/2021 MRN: [id number]  DOB: Apr 14, 1961  Patient Care Team: Curt Bears, MD as PCP - General (Oncology) Valrie Hart, RN as Oncology Nurse Navigator    REASON FOR CONSULTATION: Richard Santos is a 61 y.o. 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  Palliative ask to see for symptom management and goals of care.    SOCIAL HISTORY:     reports that he has been smoking cigarettes. He has a 12.50 pack-year smoking history. He has never used smokeless tobacco. He reports current alcohol use of about 49.0 standard drinks per week. He reports that he does not use drugs.  ADVANCE DIRECTIVES:  Patient has a documented advanced directive. Document visible in [employer] and personally reviewed. His wife Richard Santos and step-daughter, Richard Santos.   CODE STATUS: Full code  PAST MEDICAL HISTORY: Past Medical History:  Diagnosis Date   Allergic rhinitis    Allergy    Anxiety    Chronic low back pain    DDD (degenerative disc disease), lumbar    ED (erectile dysfunction)    Finger injury    cut pads off 3rd and 4th finger left hand/due to lawn mower accident   GERD (gastroesophageal reflux disease)    Heart murmur    as child only-    History of kidney stones    HTN (hypertension) 08/20/2016   Hyperlipidemia    Incomplete right bundle branch block    Prostate cancer ([place]) dx 10/02/14   stage T1c   Sigmoid diverticulosis    Small cell lung cancer ([place]) 03/2020   Wears glasses     PAST SURGICAL HISTORY:  Past Surgical History:  Procedure Laterality Date   BRONCHIAL BIOPSY  04/12/2020   Procedure: BRONCHIAL BIOPSIES;  Surgeon: Marshell Garfinkel, MD;  Location: [facility];  Service: Cardiopulmonary;;   BRONCHIAL BRUSHINGS  04/12/2020    Procedure: BRONCHIAL BRUSHINGS;  Surgeon: Marshell Garfinkel, MD;  Location: [facility];  Service: Cardiopulmonary;;   BRONCHIAL NEEDLE ASPIRATION BIOPSY  04/12/2020   Procedure: BRONCHIAL NEEDLE ASPIRATION BIOPSIES;  Surgeon: Marshell Garfinkel, MD;  Location: [facility];  Service: Cardiopulmonary;;   BRONCHIAL WASHINGS  04/12/2020   Procedure: BRONCHIAL WASHINGS;  Surgeon: Marshell Garfinkel, MD;  Location: [facility];  Service: Cardiopulmonary;;   COLONOSCOPY     COLONOSCOPY W/ POLYPECTOMY  04-19-2012   ENDOBRONCHIAL ULTRASOUND N/A 04/12/2020   Procedure: ENDOBRONCHIAL ULTRASOUND;  Surgeon: Marshell Garfinkel, MD;  Location: [facility];  Service: Cardiopulmonary;  Laterality: N/A;   ESOPHAGOGASTRODUODENOSCOPY  08-05-2010   HEMOSTASIS CONTROL  04/12/2020   Procedure: HEMOSTASIS CONTROL;  Surgeon: Marshell Garfinkel, MD;  Location: [facility];  Service: Cardiopulmonary;;   IR IMAGING GUIDED PORT INSERTION  05/31/2020   POLYPECTOMY     PROSTATE BIOPSY  10/02/14   RADIOACTIVE SEED IMPLANT N/A 01/30/2015   Procedure: RADIOACTIVE SEED IMPLANT;  Surgeon: Rana Snare, MD;  Location: [facility];  Service: Urology;  Laterality: N/A;   UPPER GASTROINTESTINAL ENDOSCOPY  04/03/2021   VIDEO BRONCHOSCOPY N/A 04/12/2020   Procedure: VIDEO BRONCHOSCOPY WITHOUT FLUORO;  Surgeon: Marshell Garfinkel, MD;  Location: [facility];  Service: Cardiopulmonary;  Laterality: N/A;    HEMATOLOGY/ONCOLOGY HISTORY:  Oncology History  Small cell lung cancer, left upper lobe ([place])  04/18/2020 Initial Diagnosis   Small cell lung cancer, left upper lobe (  [place])   05/06/2020 - 05/29/2021 Chemotherapy          12/25/2020 Cancer Staging   Staging form: Lung, AJCC 8th Edition - Clinical: Stage IVB (cT2b, cN2, cM1c) - Signed by Curt Bears, MD on 12/25/2020    06/24/2021 -  Chemotherapy   Patient is on Treatment Plan : LUNG SMALL CELL Carboplatin D1 / Etoposide D1-3 q21d       ALLERGIES:  is allergic to bee venom,  elemental sulfur, other, sulfa antibiotics, and namenda [memantine].  MEDICATIONS:  Current Outpatient Medications  Medication Sig Dispense Refill   aspirin EC 81 MG tablet Take 81 mg by mouth daily. Swallow whole.     Budeson-Glycopyrrol-Formoterol (BREZTRI AEROSPHERE) 160-9-4.8 MCG/ACT AERO Inhale 2 puffs into the lungs in the morning and at bedtime. 10.7 g 11   busPIRone (BUSPAR) 10 MG tablet Take 10 mg by mouth 2 (two) times daily.     dexamethasone (DECADRON) 4 MG tablet Take 1 tablet (4 mg total) by mouth daily. 30 tablet 3   divalproex (DEPAKOTE) 250 MG DR tablet Take 1 tablet (250 mg total) by mouth 2 (two) times daily. 60 tablet 2   famotidine (PEPCID) 20 MG tablet Take 1 tablet (20 mg total) by mouth 2 (two) times daily. 60 tablet 2   lovastatin (MEVACOR) 20 MG tablet Take 1 tablet (20 mg total) by mouth at bedtime. 90 tablet 3   Multiple Vitamin (MULTIVITAMIN) capsule Take 1 capsule by mouth daily.     ondansetron (ZOFRAN-ODT) 8 MG disintegrating tablet TAKE 1 TABLET BY MOUTH EVERY 8 HOURS AS NEEDED FOR NAUSEA OR VOMITING. 30 tablet 2   pantoprazole (PROTONIX) 40 MG tablet Take 40 mg by mouth every morning.     prochlorperazine (COMPAZINE) 10 MG tablet TAKE 1 TABLET BY MOUTH EVERY 6 HOURS AS NEEDED 30 tablet 2   sodium bicarbonate 650 MG tablet TAKE 1 TABLET BY MOUTH TWICE A DAY 180 tablet 1   traMADol (ULTRAM) 50 MG tablet TAKE 1/2 TABLET BY MOUTH TWICE A DAY AS NEEDED FOR PAIN 60 tablet 2   No current facility-administered medications for this visit.   Facility-Administered Medications Ordered in Other Visits  Medication Dose Route Frequency Provider Last Rate Last Admin   heparin lock flush 100 unit/mL  500 Units Intracatheter Daily PRN Curt Bears, MD       sodium chloride flush (NS) 0.9 % injection 10 mL  10 mL Intracatheter PRN Curt Bears, MD       sodium chloride flush (NS) 0.9 % injection 10 mL  10 mL Intracatheter PRN Curt Bears, MD   10 mL at 07/16/21  1606    VITAL SIGNS: BP 112/76 (BP Location: Left Arm, Patient Position: Sitting)    Pulse 95    Temp 97.7 F (36.5 C) (Axillary)    Resp 18    Ht 6' (1.829 m)    SpO2 98%    BMI 18.51 kg/m  There were no vitals filed for this visit.  Estimated body mass index is 18.51 kg/m as calculated from the following:   Height as of this encounter: 6' (1.829 m).   Weight as of 12/23/21: 136 lb 8 oz (61.9 kg).  LABS: CBC:    Component Value Date/Time   WBC 7.3 12/19/2021 1302   WBC 3.5 (L) 06/20/2021 0430   HGB 11.3 (L) 12/19/2021 1302   HCT 32.5 (L) 12/19/2021 1302   PLT 166 12/19/2021 1302   MCV 104.2 (H) 12/19/2021 1302   NEUTROABS  5.9 12/19/2021 1302   LYMPHSABS 0.8 12/19/2021 1302   MONOABS 0.6 12/19/2021 1302   EOSABS 0.0 12/19/2021 1302   BASOSABS 0.0 12/19/2021 1302   Comprehensive Metabolic Panel:    Component Value Date/Time   NA 135 12/19/2021 1302   K 4.0 12/19/2021 1302   CL 97 (L) 12/19/2021 1302   CO2 30 12/19/2021 1302   BUN 30 (H) 12/19/2021 1302   CREATININE 0.86 12/19/2021 1302   GLUCOSE 102 (H) 12/19/2021 1302   CALCIUM 9.6 12/19/2021 1302   AST 39 12/19/2021 1302   ALT 62 (H) 12/19/2021 1302   ALKPHOS 56 12/19/2021 1302   BILITOT 0.4 12/19/2021 1302   PROT 6.9 12/19/2021 1302   ALBUMIN 4.3 12/19/2021 1302    RADIOGRAPHIC STUDIES: CT Chest W Contrast  Result Date: 12/21/2021 CLINICAL DATA:  Lung cancer, metastatic disease evaluation, chemotherapy and immunotherapy complete EXAM: CT CHEST, ABDOMEN, AND PELVIS WITH CONTRAST TECHNIQUE: Multidetector CT imaging of the chest, abdomen and pelvis was performed following the standard protocol during bolus administration of intravenous contrast. RADIATION DOSE REDUCTION: This exam was performed according to the departmental dose-optimization program which includes automated exposure control, adjustment of the mA and/or kV according to patient size and/or use of iterative reconstruction technique. CONTRAST:  144mL  OMNIPAQUE IOHEXOL 300 MG/ML SOLN, additional oral enteric contrast COMPARISON:  10/20/2021 FINDINGS: CT CHEST FINDINGS Cardiovascular: Right chest port catheter. Aortic atherosclerosis. Normal heart size. Three-vessel coronary artery calcifications. No pericardial effusion. Mediastinum/Nodes: No enlarged mediastinal, hilar, or axillary lymph nodes. Thyroid gland, trachea, and esophagus demonstrate no significant findings. Lungs/Pleura: Unchanged post treatment appearance of the left chest, with perihilar and paramedian fibrosis, consolidation, and bronchiectasis. Unchanged cavitary lesion of the peripheral left upper lobe measuring 2.4 x 1.4 cm (series 5, image 40). No pleural effusion or pneumothorax. Musculoskeletal: No chest wall mass. CT ABDOMEN PELVIS FINDINGS Hepatobiliary: No solid liver abnormality is seen. No gallstones, gallbladder wall thickening, or biliary dilatation. Pancreas: Unremarkable. No pancreatic ductal dilatation or surrounding inflammatory changes. Spleen: Normal in size without significant abnormality. Adrenals/Urinary Tract: Unchanged post treatment appearance of the left adrenal gland, without evidence of recurrent nodule (series 2, image 65). The right adrenal gland is normal. Kidneys are normal, without renal calculi, solid lesion, or hydronephrosis. Bladder is unremarkable. Stomach/Bowel: Stomach is within normal limits. Appendix appears normal. No evidence of bowel wall thickening, distention, or inflammatory changes. Moderate burden of stool throughout the colon and rectum. Vascular/Lymphatic: Aortic atherosclerosis. No enlarged abdominal or pelvic lymph nodes. Reproductive: Prostate brachytherapy. Other: No abdominal wall hernia or abnormality. No ascites. Musculoskeletal: No acute osseous findings. Unchanged, scattered sclerotic osseous metastatic lesions, including of the sternal body (series 7, image 91), the T6, T7, and T8 vertebral bodies, and bilateral proximal femurs (series  6, image 63, 54). IMPRESSION: 1. Unchanged post treatment appearance of the left chest, with perihilar and paramedian fibrosis, consolidation, and bronchiectasis. 2. Unchanged cavitary lesion of the peripheral left upper lobe measuring 2.4 x 1.4 cm. 3. Unchanged post treatment appearance of the left adrenal gland, without evidence of recurrent nodule. 4. No evidence of new metastatic disease in the chest, abdomen, or pelvis. 5. Unchanged, scattered sclerotic osseous metastatic lesions. No new osseous lesions. 6. Prostate brachytherapy. 7. Coronary artery disease. Aortic Atherosclerosis (ICD10-I70.0). Electronically Signed   By: Delanna Ahmadi M.D.   On: 12/21/2021 07:48   MR Brain W Wo Contrast  Result Date: 12/19/2021 CLINICAL DATA:  Metastatic lung cancer. EXAM: MRI HEAD WITHOUT AND WITH CONTRAST TECHNIQUE: Multiplanar, multiecho  pulse sequences of the brain and surrounding structures were obtained without and with intravenous contrast. CONTRAST:  12mL GADAVIST GADOBUTROL 1 MMOL/ML IV SOLN COMPARISON:  Head MRI 11/06/2021 FINDINGS: Brain: There are innumerable solidly enhancing lesions throughout both cerebral hemispheres as well as cerebellum and brainstem which reflect a combination of enlargement of the enhancing foci on the prior MRI and development of new lesions. The largest measures 2 cm in the mesial right temporal lobe. Brainstem and cerebellar lesions measure up to 1.2 cm. There are blood products associated with some of the lesions, and the majority demonstrate restricted diffusion. Associated vasogenic edema is mild in the posterior fossa and up to moderate in the left greater than right cerebral hemispheres without midline shift or other significant mass effect. No acute large territory infarct or extra-axial fluid collection is evident. There is mild cerebral atrophy. Vascular: Major intracranial vascular flow voids are preserved. Skull and upper cervical spine: Unremarkable bone marrow signal.  Sinuses/Orbits: Unremarkable orbits. Trace mucosal thickening in the left maxillary sinus. Moderate right and small left mastoid effusions. Other: None. IMPRESSION: Innumerable brain metastases with mild-to-moderate edema. No significant mass effect. Electronically Signed   By: Logan Bores M.D.   On: 12/19/2021 13:59   CT Abdomen Pelvis W Contrast  Result Date: 12/21/2021 CLINICAL DATA:  Lung cancer, metastatic disease evaluation, chemotherapy and immunotherapy complete EXAM: CT CHEST, ABDOMEN, AND PELVIS WITH CONTRAST TECHNIQUE: Multidetector CT imaging of the chest, abdomen and pelvis was performed following the standard protocol during bolus administration of intravenous contrast. RADIATION DOSE REDUCTION: This exam was performed according to the departmental dose-optimization program which includes automated exposure control, adjustment of the mA and/or kV according to patient size and/or use of iterative reconstruction technique. CONTRAST:  174mL OMNIPAQUE IOHEXOL 300 MG/ML SOLN, additional oral enteric contrast COMPARISON:  10/20/2021 FINDINGS: CT CHEST FINDINGS Cardiovascular: Right chest port catheter. Aortic atherosclerosis. Normal heart size. Three-vessel coronary artery calcifications. No pericardial effusion. Mediastinum/Nodes: No enlarged mediastinal, hilar, or axillary lymph nodes. Thyroid gland, trachea, and esophagus demonstrate no significant findings. Lungs/Pleura: Unchanged post treatment appearance of the left chest, with perihilar and paramedian fibrosis, consolidation, and bronchiectasis. Unchanged cavitary lesion of the peripheral left upper lobe measuring 2.4 x 1.4 cm (series 5, image 40). No pleural effusion or pneumothorax. Musculoskeletal: No chest wall mass. CT ABDOMEN PELVIS FINDINGS Hepatobiliary: No solid liver abnormality is seen. No gallstones, gallbladder wall thickening, or biliary dilatation. Pancreas: Unremarkable. No pancreatic ductal dilatation or surrounding inflammatory  changes. Spleen: Normal in size without significant abnormality. Adrenals/Urinary Tract: Unchanged post treatment appearance of the left adrenal gland, without evidence of recurrent nodule (series 2, image 65). The right adrenal gland is normal. Kidneys are normal, without renal calculi, solid lesion, or hydronephrosis. Bladder is unremarkable. Stomach/Bowel: Stomach is within normal limits. Appendix appears normal. No evidence of bowel wall thickening, distention, or inflammatory changes. Moderate burden of stool throughout the colon and rectum. Vascular/Lymphatic: Aortic atherosclerosis. No enlarged abdominal or pelvic lymph nodes. Reproductive: Prostate brachytherapy. Other: No abdominal wall hernia or abnormality. No ascites. Musculoskeletal: No acute osseous findings. Unchanged, scattered sclerotic osseous metastatic lesions, including of the sternal body (series 7, image 91), the T6, T7, and T8 vertebral bodies, and bilateral proximal femurs (series 6, image 63, 54). IMPRESSION: 1. Unchanged post treatment appearance of the left chest, with perihilar and paramedian fibrosis, consolidation, and bronchiectasis. 2. Unchanged cavitary lesion of the peripheral left upper lobe measuring 2.4 x 1.4 cm. 3. Unchanged post treatment appearance of the left adrenal  gland, without evidence of recurrent nodule. 4. No evidence of new metastatic disease in the chest, abdomen, or pelvis. 5. Unchanged, scattered sclerotic osseous metastatic lesions. No new osseous lesions. 6. Prostate brachytherapy. 7. Coronary artery disease. Aortic Atherosclerosis (ICD10-I70.0). Electronically Signed   By: Delanna Ahmadi M.D.   On: 12/21/2021 07:48    PERFORMANCE STATUS (ECOG) : 2 - Symptomatic, <50% confined to bed  Review of Systems  Musculoskeletal:  Positive for back pain.  Neurological:  Positive for weakness.  Unless otherwise noted, a complete review of systems is negative.  Physical Exam General: NAD, in a wheelchair   Cardiovascular: regular rate and rhythm Pulmonary: clear ant fields Abdomen: soft, nontender, + bowel sounds Extremities: no edema, no joint deformities, weakness Neurological: Weakness but otherwise nonfocal  IMPRESSION: This is my initial visit with Richard Santos. He presents today with his wife, Richard Santos.   I introduced myself, Advertising copywriter, and Palliative's role in collaboration with the oncology team. Concept of Palliative Care was introduced as specialized medical care for people and their families living with serious illness.  It focuses on providing relief from the symptoms and stress of a serious illness.  The goal is to improve quality of life for both the patient and the family. Values and goals of care important to patient and family were attempted to be elicited.   Mr. Bays shares he and Richard Santos have been married for more than 23 years. They have 2 daughters and 4 grandchildren between the two of them. 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   In the home he requires some assistance with ADLs. They are in the process of having their tub/shower remodeled with a walk-in shower and grab bars for additional safety in the home. His appetite is much improved since starting on the dexamethasone in addition to improvement in his nausea/ vomiting.   He reports a recent fall outside after leaving a restaurant and losing balance when stepping off the curb. Some right side and lower back tenderness as a result. Denies hitting his head or any injuries requiring medical treatment. He is using a quad cane however given weakness discussed use of a walker for additional safety.   Gracyn expresses he is not having any pain outside of his discomfort from recent fall.   His appetite is improved and his nausea has decreased. He is tolerating current radiation treatments.   We discussed Her current illness and what it means in the larger context of Her on-going co-morbidities.  Natural disease trajectory and expectations were discussed.  I approached discussions regarding goals of care and full code status. He has a documented advanced directive. [name] and Richard Santos expresses they have not discussed medical wishes in terms of code status or other life sustaining measures. Encouraged ongoing discussions.   I discussed the importance of continued conversation with family and their medical providers regarding overall plan of care and treatment options, ensuring decisions are within the context of the patients values and GOCs.  PLAN: Nausea and vomiting has improved with dexamethasone and zofran support. Appetite much improved. Education provided on increased protein intake and ensure drinks.  Ongoing weakness. Tolerating radiation treatments.  This was our initial visit. Given no symptom management needs at this time, spent time establishing therapeutic relationship and introduction to goals of care conversations.  I will plan to see patient back in 2 weeks in collaboration to other oncology appointments.    Patient expressed understanding and was in  agreement with this plan. He also understands that He can call the clinic at any time with any questions, concerns, or complaints.   Time Total: 45 min.   Visit consisted of counseling and education dealing with the complex and emotionally intense issues of symptom management and palliative care in the setting of serious and potentially life-threatening illness.Greater than 50%  of this time was spent counseling and coordinating care related to the above assessment and plan.  Signed by: Alda Lea, AGPCNP-BC Palliative Medicine Team

## 2021-12-30 ENCOUNTER — Ambulatory Visit
Admission: RE | Admit: 2021-12-30 | Discharge: 2021-12-30 | Disposition: A | Discharge status: Home / Self Care | Payer: 59 | Source: Ambulatory Visit | Attending: Radiation Oncology | Admitting: Radiation Oncology

## 2021-12-30 DIAGNOSIS — Z510 Encounter for antineoplastic radiation therapy: Secondary | ICD-10-CM | POA: Diagnosis not present

## 2021-12-30 NOTE — Progress Notes (Signed)
Order placed for a Walker through [employer] to [employer].

## 2021-12-31 ENCOUNTER — Ambulatory Visit
Admission: RE | Admit: 2021-12-31 | Discharge: 2021-12-31 | Disposition: A | Discharge status: Home / Self Care | Payer: 59 | Source: Ambulatory Visit | Attending: Radiation Oncology | Admitting: Radiation Oncology

## 2021-12-31 ENCOUNTER — Other Ambulatory Visit: Payer: Self-pay

## 2021-12-31 DIAGNOSIS — Z510 Encounter for antineoplastic radiation therapy: Secondary | ICD-10-CM | POA: Diagnosis not present

## 2022-01-01 ENCOUNTER — Ambulatory Visit
Admission: RE | Admit: 2022-01-01 | Discharge: 2022-01-01 | Disposition: A | Discharge status: Home / Self Care | Payer: 59 | Source: Ambulatory Visit | Attending: Radiation Oncology | Admitting: Radiation Oncology

## 2022-01-01 DIAGNOSIS — Z510 Encounter for antineoplastic radiation therapy: Secondary | ICD-10-CM | POA: Diagnosis not present

## 2022-01-02 ENCOUNTER — Ambulatory Visit
Admission: RE | Admit: 2022-01-02 | Discharge: 2022-01-02 | Disposition: A | Discharge status: Home / Self Care | Payer: 59 | Source: Ambulatory Visit | Attending: Radiation Oncology | Admitting: Radiation Oncology

## 2022-01-02 ENCOUNTER — Other Ambulatory Visit: Payer: Self-pay

## 2022-01-02 ENCOUNTER — Other Ambulatory Visit: Payer: Self-pay | Admitting: Radiation Oncology

## 2022-01-02 DIAGNOSIS — C7931 Secondary malignant neoplasm of brain: Secondary | ICD-10-CM

## 2022-01-02 DIAGNOSIS — Z510 Encounter for antineoplastic radiation therapy: Secondary | ICD-10-CM | POA: Diagnosis not present

## 2022-01-05 ENCOUNTER — Ambulatory Visit
Admission: RE | Admit: 2022-01-05 | Discharge: 2022-01-05 | Disposition: A | Discharge status: Home / Self Care | Payer: 59 | Source: Ambulatory Visit | Attending: Radiation Oncology | Admitting: Radiation Oncology

## 2022-01-05 ENCOUNTER — Other Ambulatory Visit: Payer: Self-pay

## 2022-01-05 DIAGNOSIS — Z510 Encounter for antineoplastic radiation therapy: Secondary | ICD-10-CM | POA: Diagnosis not present

## 2022-01-06 ENCOUNTER — Ambulatory Visit
Admission: RE | Admit: 2022-01-06 | Discharge: 2022-01-06 | Disposition: A | Discharge status: Home / Self Care | Payer: 59 | Source: Ambulatory Visit | Attending: Radiation Oncology | Admitting: Radiation Oncology

## 2022-01-06 ENCOUNTER — Other Ambulatory Visit: Payer: Self-pay

## 2022-01-06 DIAGNOSIS — Z510 Encounter for antineoplastic radiation therapy: Secondary | ICD-10-CM | POA: Diagnosis not present

## 2022-01-07 ENCOUNTER — Encounter: Payer: Self-pay | Admitting: Nurse Practitioner

## 2022-01-07 ENCOUNTER — Ambulatory Visit
Admission: RE | Admit: 2022-01-07 | Discharge: 2022-01-07 | Disposition: A | Discharge status: Home / Self Care | Payer: 59 | Source: Ambulatory Visit | Attending: Radiation Oncology | Admitting: Radiation Oncology

## 2022-01-07 ENCOUNTER — Encounter: Payer: Self-pay | Admitting: Radiation Oncology

## 2022-01-07 ENCOUNTER — Inpatient Hospital Stay (HOSPITAL_BASED_OUTPATIENT_CLINIC_OR_DEPARTMENT_OTHER): Payer: 59 | Admitting: Nurse Practitioner

## 2022-01-07 VITALS — BP 103/72 | HR 88 | Temp 97.8°F | Resp 16 | Ht 72.0 in | Wt 138.6 lb

## 2022-01-07 DIAGNOSIS — R530 Neoplastic (malignant) related fatigue: Secondary | ICD-10-CM

## 2022-01-07 DIAGNOSIS — Z510 Encounter for antineoplastic radiation therapy: Secondary | ICD-10-CM | POA: Diagnosis not present

## 2022-01-07 DIAGNOSIS — Z515 Encounter for palliative care: Secondary | ICD-10-CM

## 2022-01-07 DIAGNOSIS — C7931 Secondary malignant neoplasm of brain: Secondary | ICD-10-CM | POA: Diagnosis not present

## 2022-01-07 DIAGNOSIS — C3412 Malignant neoplasm of upper lobe, left bronchus or lung: Secondary | ICD-10-CM

## 2022-01-07 DIAGNOSIS — K5900 Constipation, unspecified: Secondary | ICD-10-CM

## 2022-01-07 NOTE — Progress Notes (Signed)
[place]  Telephone:(336) [phone] Fax:(336) [phone]   Name: Richard Santos Date: 01/07/2022 MRN: [id number]  DOB: 1961-04-27  Patient Care Team: Curt Bears, MD as PCP - General (Oncology) Valrie Hart, RN as Oncology Nurse Navigator    REASON FOR CONSULTATION: Richard Santos is a 61 y.o. 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  Palliative ask to see for symptom management and goals of care.    SOCIAL HISTORY:     reports that he has been smoking cigarettes. He has a 12.50 pack-year smoking history. He has never used smokeless tobacco. He reports current alcohol use of about 49.0 standard drinks per week. He reports that he does not use drugs.  ADVANCE DIRECTIVES:  Patient has a documented advanced directive. Document visible in [facility] and personally reviewed. His wife [name] and step-daughter, [name].   CODE STATUS: Full code  PAST MEDICAL HISTORY: Past Medical History:  Diagnosis Date   Allergic rhinitis    Allergy    Anxiety    Chronic low back pain    DDD (degenerative disc disease), lumbar    ED (erectile dysfunction)    Finger injury    cut pads off 3rd and 4th finger left hand/due to lawn mower accident   GERD (gastroesophageal reflux disease)    Heart murmur    as child only-    History of kidney stones    HTN (hypertension) 08/20/2016   Hyperlipidemia    Incomplete right bundle branch block    Prostate cancer ([place]) dx 10/02/14   stage T1c   Sigmoid diverticulosis    Small cell lung cancer ([place]) 03/2020   Wears glasses     PAST SURGICAL HISTORY:  Past Surgical History:  Procedure Laterality Date   BRONCHIAL BIOPSY  04/12/2020   Procedure: BRONCHIAL BIOPSIES;  Surgeon: Marshell Garfinkel, MD;  Location: [facility];  Service: Cardiopulmonary;;   BRONCHIAL BRUSHINGS  04/12/2020    Procedure: BRONCHIAL BRUSHINGS;  Surgeon: Marshell Garfinkel, MD;  [facility];  Service: Cardiopulmonary;;   BRONCHIAL NEEDLE ASPIRATION BIOPSY  04/12/2020   Procedure: BRONCHIAL NEEDLE ASPIRATION BIOPSIES;  Surgeon: Marshell Garfinkel, MD;  Location: [facility];  Service: Cardiopulmonary;;   BRONCHIAL WASHINGS  04/12/2020   Procedure: BRONCHIAL WASHINGS;  Surgeon: Marshell Garfinkel, MD;  Location: [facility];  Service: Cardiopulmonary;;   COLONOSCOPY     COLONOSCOPY W/ POLYPECTOMY  04-19-2012   ENDOBRONCHIAL ULTRASOUND N/A 04/12/2020   Procedure: ENDOBRONCHIAL ULTRASOUND;  Surgeon: Marshell Garfinkel, MD;  [facility];  Service: Cardiopulmonary;  Laterality: N/A;   ESOPHAGOGASTRODUODENOSCOPY  08-05-2010   HEMOSTASIS CONTROL  04/12/2020   Procedure: HEMOSTASIS CONTROL;  Surgeon: Marshell Garfinkel, MD;  Location: [facility];  Service: Cardiopulmonary;;   IR IMAGING GUIDED PORT INSERTION  05/31/2020   POLYPECTOMY     PROSTATE BIOPSY  10/02/14   RADIOACTIVE SEED IMPLANT N/A 01/30/2015   Procedure: RADIOACTIVE SEED IMPLANT;  Surgeon: Rana Snare, MD;  Location: [facility];  Service: Urology;  Laterality: N/A;   UPPER GASTROINTESTINAL ENDOSCOPY  04/03/2021   VIDEO BRONCHOSCOPY N/A 04/12/2020   Procedure: VIDEO BRONCHOSCOPY WITHOUT FLUORO;  Surgeon: Marshell Garfinkel, MD;  Location: [facility];  Service: Cardiopulmonary;  Laterality: N/A;    ALLERGIES:  is allergic to bee venom, elemental sulfur, other, sulfa antibiotics, and namenda [memantine].  MEDICATIONS:  Current Outpatient Medications  Medication Sig Dispense Refill  aspirin EC 81 MG tablet Take 81 mg by mouth daily. Swallow whole.     Budeson-Glycopyrrol-Formoterol (BREZTRI AEROSPHERE) 160-9-4.8 MCG/ACT AERO Inhale 2 puffs into the lungs in the morning and at bedtime. 10.7 g 11   busPIRone (BUSPAR) 10 MG tablet Take 10 mg by mouth 2 (two) times daily.     dexamethasone (DECADRON) 4 MG tablet Take 1 tablet (4  mg total) by mouth daily. 30 tablet 3   divalproex (DEPAKOTE) 250 MG DR tablet Take 1 tablet (250 mg total) by mouth 2 (two) times daily. 60 tablet 2   famotidine (PEPCID) 20 MG tablet Take 1 tablet (20 mg total) by mouth 2 (two) times daily. 60 tablet 2   lovastatin (MEVACOR) 20 MG tablet Take 1 tablet (20 mg total) by mouth at bedtime. 90 tablet 3   Multiple Vitamin (MULTIVITAMIN) capsule Take 1 capsule by mouth daily.     ondansetron (ZOFRAN-ODT) 8 MG disintegrating tablet TAKE 1 TABLET BY MOUTH EVERY 8 HOURS AS NEEDED FOR NAUSEA OR VOMITING. 30 tablet 2   pantoprazole (PROTONIX) 40 MG tablet Take 40 mg by mouth every morning.     prochlorperazine (COMPAZINE) 10 MG tablet TAKE 1 TABLET BY MOUTH EVERY 6 HOURS AS NEEDED 30 tablet 2   sodium bicarbonate 650 MG tablet TAKE 1 TABLET BY MOUTH TWICE A DAY 180 tablet 1   traMADol (ULTRAM) 50 MG tablet TAKE 1/2 TABLET BY MOUTH TWICE A DAY AS NEEDED FOR PAIN 60 tablet 2   No current facility-administered medications for this visit.   Facility-Administered Medications Ordered in Other Visits  Medication Dose Route Frequency Provider Last Rate Last Admin   heparin lock flush 100 unit/mL  500 Units Intracatheter Daily PRN Curt Bears, MD       sodium chloride flush (NS) 0.9 % injection 10 mL  10 mL Intracatheter PRN Curt Bears, MD       sodium chloride flush (NS) 0.9 % injection 10 mL  10 mL Intracatheter PRN Curt Bears, MD   10 mL at 07/16/21 1606    VITAL SIGNS: BP 103/72 (BP Location: Left Arm, Patient Position: Sitting)    Pulse 88    Temp 97.8 F (36.6 C) (Oral)    Resp 16    Ht 6' (1.829 m)    Wt 138 lb 9.6 oz (62.9 kg)    SpO2 100%    BMI 18.80 kg/m  Filed Weights   01/07/22 1055  Weight: 138 lb 9.6 oz (62.9 kg)    Estimated body mass index is 18.8 kg/m as calculated from the following:   Height as of this encounter: 6' (1.829 m).   Weight as of this encounter: 138 lb 9.6 oz (62.9 kg).  PERFORMANCE STATUS (ECOG) : 2 -  Symptomatic, <50% confined to bed  Physical Exam General: NAD, in a wheelchair  Cardiovascular: regular rate and rhythm Pulmonary: normal breathing pattern  Abdomen: soft, nontender, + bowel sounds Extremities: no edema, no joint deformities, weakness Neurological: Weakness but otherwise nonfocal  IMPRESSION:  Mr. Stief presents today for follow-up. His wife is also present. He completed his last radiation treatment today. Tolerated treatments well.   Denies pain. Continues to have some discomfort to right side from previous fall. Ibuprofen and heat compress provides some relief.   Appetite remains good. Wife reports his appetite has been the best she has ever seen in throughout their marriage. They are both appreciative of this.   Complains of some constipation. Reports having a hard bowel  movement every few days. Education provided on increased water and fiber intake. He is taking colace. Instructed patient to begin taking Miralax 1-2 times daily. He and wife verbalized understanding.   I discussed the importance of continued conversation with family and their medical providers regarding overall plan of care and treatment options, ensuring decisions are within the context of the patients values and GOCs.  PLAN: Miralax 1-2 times daily for constipation. Education provided on increased water and protein intake.  Appetite much improved. Education provided on increased protein intake and ensure drinks.  Ongoing weakness. Tolerating radiation treatments.  No other symptom management needs. Ongoing support and goals of care discussions.  I will plan to follow-up in 3-4 weeks in collaboration to other oncology appointments.    Patient expressed understanding and was in agreement with this plan. He also understands that He can call the clinic at any time with any questions, concerns, or complaints.   Time Total: 30 min  Visit consisted of counseling and education dealing with the complex  and emotionally intense issues of symptom management and palliative care in the setting of serious and potentially life-threatening illness.Greater than 50%  of this time was spent counseling and coordinating care related to the above assessment and plan.  Signed by: Alda Lea, AGPCNP-BC Palliative Medicine Team

## 2022-01-12 ENCOUNTER — Other Ambulatory Visit: Payer: Self-pay | Admitting: Internal Medicine

## 2022-01-14 NOTE — Progress Notes (Signed)
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 He was fatigued and had anticipated skin changes in the treatment field. He will follow up with Dr. Mickeal Skinner for ongoing gait disturbance.  Plan:The patient will receive a call in about one month from the radiation oncology department. He will continue follow up with Dr. Julien Nordmann and Dr. Mickeal Skinner as well.   ________________________________________________    Carola Rhine, [facility]

## 2022-01-20 ENCOUNTER — Other Ambulatory Visit: Payer: Self-pay

## 2022-01-20 ENCOUNTER — Inpatient Hospital Stay (HOSPITAL_BASED_OUTPATIENT_CLINIC_OR_DEPARTMENT_OTHER): Payer: 59 | Admitting: Internal Medicine

## 2022-01-20 VITALS — BP 92/64 | HR 101 | Temp 97.5°F | Resp 17 | Wt 140.9 lb

## 2022-01-20 DIAGNOSIS — C7931 Secondary malignant neoplasm of brain: Secondary | ICD-10-CM

## 2022-01-20 DIAGNOSIS — Z510 Encounter for antineoplastic radiation therapy: Secondary | ICD-10-CM | POA: Diagnosis not present

## 2022-01-20 MED ORDER — DEXAMETHASONE 1 MG PO TABS: ORAL_TABLET | ORAL | 0 refills | Status: DC

## 2022-01-20 NOTE — Progress Notes (Signed)
Home Health Referral sent to [facility], pending response.

## 2022-01-20 NOTE — Progress Notes (Signed)
[facility] [place], [place] [address] [phone]   Interval Evaluation  Date of Service: 01/20/22 Patient Name: Richard Santos Patient MRN: [id number] Patient DOB: Aug 19, 1961 Provider: Ventura Sellers, MD  Identifying Statement:  Richard Santos is a 61 y.o. male with Metastatic cancer to brain [facility]) - Plan: MR [place], Ambulatory referral to Home Health  Primary Cancer:  Oncologic History: Oncology History  Small cell lung cancer, left upper lobe ([place])  04/18/2020 Initial Diagnosis   Small cell lung cancer, left upper lobe ([place])   05/06/2020 - 05/29/2021 Chemotherapy          12/25/2020 Cancer Staging   Staging form: Lung, AJCC 8th Edition - Clinical: Stage IVB (cT2b, cN2, cM1c) - Signed by Curt Bears, MD on 12/25/2020    06/24/2021 -  Chemotherapy   Patient is on Treatment Plan : LUNG SMALL CELL Carboplatin D1 / Etoposide D1-3 q21d      CNS Oncologic History 09/11/20: Completes PCI irradiation 05/30/21: Salvage SRS x5 Lisbeth Renshaw) 01/07/22: Completes WBRT for innumerable metastases Lisbeth Renshaw)  Interval History: 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  He and his wife overall stability in function since prior visit.  He continues to experience issues with fatigue, short term memory, cognitive processing and balance.  He is walking on his own with assistance, using a walker when going outside.  Most needs are still taken care of independently but his wife helps him with bathing.  Currently dosing decadron 4mg  daily, nausea has improved since radiation therapy.   Decadron 12/02/21: 4mg  01/20/22: 4mg   (12/02/21) He and his wife describe increased lethargy, generalized weakness, poor appetite.  He has barely been eating since late last week.  He seems to exhibit increased confusion as well.  Wife describes several episodes of right hand twitching, which stopped after  dosing the Keppra.  However, his lethargy and confusion seem to have started at the same time as the [place].    H+P (11/13/21) Patient presents today to discuss new neurologic symptoms.  He describes 3 weeks history of episodes of "flashing blue and yellow lights" in the right side of his vision, lasting for a few minutes.  Episodes do persist when right eye is closed, or at least appear to.  Frequency is "multiple times per day, maybe even once per hour".  He had one episode of confusion which may have followed one of the events.  Also describes feeling of numbness affecting right side of mouth and lower face.  These may be associated with the visual symptoms, onset was the same time period.  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  Recent MRI demonstrates progressive findings, was reviewed earlier this week with radiation oncology.  Medications: Current Outpatient Medications on File Prior to Visit  Medication Sig Dispense Refill   aspirin EC 81 MG tablet Take 81 mg by mouth daily. Swallow whole.     Budeson-Glycopyrrol-Formoterol (BREZTRI AEROSPHERE) 160-9-4.8 MCG/ACT AERO Inhale 2 puffs into the lungs in the morning and at bedtime. 10.7 g 11   busPIRone (BUSPAR) 10 MG tablet TAKE 1 TABLET BY MOUTH TWICE A DAY 180 tablet 2   divalproex (DEPAKOTE) 250 MG DR tablet Take 1 tablet (250 mg total) by mouth 2 (two) times daily. 60 tablet 2   famotidine (PEPCID) 20 MG tablet Take 1 tablet (20 mg total) by mouth 2 (  two) times daily. 60 tablet 2   lovastatin (MEVACOR) 20 MG tablet Take 1 tablet (20 mg total) by mouth at bedtime. 90 tablet 3   Multiple Vitamin (MULTIVITAMIN) capsule Take 1 capsule by mouth daily.     ondansetron (ZOFRAN-ODT) 8 MG disintegrating tablet TAKE 1 TABLET BY MOUTH EVERY 8 HOURS AS NEEDED FOR NAUSEA OR VOMITING. 30 tablet 2   pantoprazole (PROTONIX) 40 MG tablet Take 40 mg by mouth every morning.     prochlorperazine (COMPAZINE) 10 MG tablet TAKE  1 TABLET BY MOUTH EVERY 6 HOURS AS NEEDED 30 tablet 2   sodium bicarbonate 650 MG tablet TAKE 1 TABLET BY MOUTH TWICE A DAY 180 tablet 1   traMADol (ULTRAM) 50 MG tablet TAKE 1/2 TABLET BY MOUTH TWICE A DAY AS NEEDED FOR PAIN 60 tablet 2   Current Facility-Administered Medications on File Prior to Visit  Medication Dose Route Frequency Provider Last Rate Last Admin   heparin lock flush 100 unit/mL  500 Units Intracatheter Daily PRN Curt Bears, MD       sodium chloride flush (NS) 0.9 % injection 10 mL  10 mL Intracatheter PRN Curt Bears, MD       sodium chloride flush (NS) 0.9 % injection 10 mL  10 mL Intracatheter PRN Curt Bears, MD   10 mL at 07/16/21 1606    Allergies:  Allergies  Allergen Reactions   Bee Venom Swelling   Elemental Sulfur Other (See Comments)    Acute renal failure   Other Other (See Comments)    Chemo medicine- patient not sure about name - swelling & rashes   Sulfa Antibiotics    Namenda [Memantine] Nausea Only    Nausea and fatigue   Past Medical History:  Past Medical History:  Diagnosis Date   Allergic rhinitis    Allergy    Anxiety    Chronic low back pain    DDD (degenerative disc disease), lumbar    ED (erectile dysfunction)    Finger injury    cut pads off 3rd and 4th finger left hand/due to lawn mower accident   GERD (gastroesophageal reflux disease)    Heart murmur    as child only-    History of kidney stones    HTN (hypertension) 08/20/2016   Hyperlipidemia    Incomplete right bundle branch block    Prostate cancer ([place]) dx 10/02/14   stage T1c   Sigmoid diverticulosis    Small cell lung cancer ([place]) 03/2020   Wears glasses    Past Surgical History:  Past Surgical History:  Procedure Laterality Date   BRONCHIAL BIOPSY  04/12/2020   Procedure: BRONCHIAL BIOPSIES;  Surgeon: Marshell Garfinkel, MD;  Location: [facility];  Service: Cardiopulmonary;;   BRONCHIAL BRUSHINGS  04/12/2020   Procedure: BRONCHIAL BRUSHINGS;   Surgeon: Marshell Garfinkel, MD;  [facility];  Service: Cardiopulmonary;;   BRONCHIAL NEEDLE ASPIRATION BIOPSY  04/12/2020   Procedure: BRONCHIAL NEEDLE ASPIRATION BIOPSIES;  Surgeon: Marshell Garfinkel, MD;  Location: [facility];  Service: Cardiopulmonary;;   BRONCHIAL WASHINGS  04/12/2020   Procedure: BRONCHIAL WASHINGS;  Surgeon: Marshell Garfinkel, MD;  Location: [facility];  Service: Cardiopulmonary;;   COLONOSCOPY     COLONOSCOPY W/ POLYPECTOMY  04-19-2012   ENDOBRONCHIAL ULTRASOUND N/A 04/12/2020   Procedure: ENDOBRONCHIAL ULTRASOUND;  Surgeon: Marshell Garfinkel, MD;  [facility];  Service: Cardiopulmonary;  Laterality: N/A;   ESOPHAGOGASTRODUODENOSCOPY  08-05-2010   HEMOSTASIS CONTROL  04/12/2020   Procedure: HEMOSTASIS CONTROL;  Surgeon: Marshell Garfinkel,  MD;  Location: [facility];  Service: Cardiopulmonary;;   IR IMAGING GUIDED PORT INSERTION  05/31/2020   POLYPECTOMY     PROSTATE BIOPSY  10/02/14   RADIOACTIVE SEED IMPLANT N/A 01/30/2015   Procedure: RADIOACTIVE SEED IMPLANT;  Surgeon: Rana Snare, MD;  [facility];  Service: Urology;  Laterality: N/A;   UPPER GASTROINTESTINAL ENDOSCOPY  04/03/2021   VIDEO BRONCHOSCOPY N/A 04/12/2020   Procedure: VIDEO BRONCHOSCOPY WITHOUT FLUORO;  Surgeon: Marshell Garfinkel, MD;  Location: [facility];  Service: Cardiopulmonary;  Laterality: N/A;   Social History:  Social History   Socioeconomic History   Marital status: Married    Spouse name: Not on file   Number of children: 0   Years of education: Not on file   Highest education level: Not on file  Occupational History   Occupation: Counselling psychologist: [employer]  Tobacco Use   Smoking status: Every Day    Packs/day: 0.50    Years: 25.00    Pack years: 12.50    Types: Cigarettes   Smokeless tobacco: Never   Tobacco comments:    smokes 10 cigs per day 08/26/21  Vaping Use   Vaping Use: Former  Substance and Sexual Activity    Alcohol use: Yes    Alcohol/week: 49.0 standard drinks    Types: 25 Cans of beer, 24 Standard drinks or equivalent per week    Comment: 5 beer per day   Drug use: No   Sexual activity: Not on file  Other Topics Concern   Not on file  Social History Narrative   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 He is married with no biological children.   Social Determinants of Health   Financial Resource Strain: Not on file  Food Insecurity: Not on file  Transportation Needs: Not on file  Physical Activity: Not on file  Stress: Not on file  Social Connections: Not on file  Intimate Partner Violence: Not on file   Family History:  Family History  Problem Relation Age of Onset   Prostate cancer Father    Heart disease Father    Heart disease Mother    Breast cancer Sister    Heart disease Other    Colon cancer Neg Hx    Colon polyps Neg Hx    Esophageal cancer Neg Hx    Rectal cancer Neg Hx    Stomach cancer Neg Hx     Review of Systems: Constitutional: Doesn't report fevers, chills or abnormal weight loss Eyes: Doesn't report blurriness of vision Ears, nose, mouth, throat, and face: Doesn't report sore throat Respiratory: Doesn't report cough, dyspnea or wheezes Cardiovascular: Doesn't report palpitation, chest discomfort  Gastrointestinal:  Doesn't report nausea, constipation, diarrhea GU: Doesn't report incontinence Skin: Doesn't report skin rashes Neurological: Per HPI Musculoskeletal: Doesn't report joint pain Behavioral/Psych: Doesn't report anxiety  Physical Exam: Vitals:   01/20/22 1212  BP: 92/64  Pulse: (!) 101  Resp: 17  Temp: (!) 97.5 F (36.4 C)  SpO2: 100%   KPS: 70. General: Alert, cooperative, pleasant, in no acute distress Head: Normal EENT: No conjunctival injection or scleral icterus.  Lungs: Resp effort normal Cardiac: Regular rate Abdomen: Non-distended abdomen Skin: No rashes cyanosis or petechiae. Extremities:  No clubbing or edema  Neurologic Exam: Mental Status: Awake and alert, attentive to examiner. Oriented to self and environment. Language is fluent with intact comprehension.  Noted psychomotor slowing today. Cranial Nerves: Visual acuity is  grossly normal. Visual fields are full. Extra-ocular movements intact. No ptosis. Face is symmetric Motor: Tone and bulk are normal. Power is full in both arms and legs. Reflexes are symmetric, no pathologic reflexes present.  Sensory: Intact to light touch Gait: Dystaxic   Labs: I have reviewed the data as listed    Component Value Date/Time   NA 135 12/19/2021 1302   K 4.0 12/19/2021 1302   CL 97 (L) 12/19/2021 1302   CO2 30 12/19/2021 1302   GLUCOSE 102 (H) 12/19/2021 1302   BUN 30 (H) 12/19/2021 1302   CREATININE 0.86 12/19/2021 1302   CALCIUM 9.6 12/19/2021 1302   PROT 6.9 12/19/2021 1302   ALBUMIN 4.3 12/19/2021 1302   AST 39 12/19/2021 1302   ALT 62 (H) 12/19/2021 1302   ALKPHOS 56 12/19/2021 1302   BILITOT 0.4 12/19/2021 1302   GFRNONAA >60 12/19/2021 1302   GFRAA >60 08/08/2020 1203   Lab Results  Component Value Date   WBC 7.3 12/19/2021   NEUTROABS 5.9 12/19/2021   HGB 11.3 (L) 12/19/2021   HCT 32.5 (L) 12/19/2021   MCV 104.2 (H) 12/19/2021   PLT 166 12/19/2021    Assessment/Plan Metastatic cancer to brain [facility]) - Plan: MR [place], Ambulatory referral to [place] is clinically stable today, now having completed whole brain radiation therapy.    Decadron should decrease to 2mg  daily x7 days, then 1mg  daily x7 days, then stop if able.  Depakote will remain at 250mg  BID.    He will meet with medical oncology to discuss role of resuming systemic therapy.   We discussed and recommended initiation of physical therapy services given his dystaxia from multiple brian metastases and treatment.  We appreciate the opportunity to participate in the care of KAIREN HALLINAN.  He should return to clinic  in 3 months with MRI brain for evaluation.  All questions were answered. The patient knows to call the clinic with any problems, questions or concerns. No barriers to learning were detected.  The total time spent in the encounter was 30 minutes and more than 50% was on counseling and review of test results   Ventura Sellers, MD Medical Director of Neuro-Oncology [facility] at [place] 01/20/22 1:06 PM

## 2022-01-20 NOTE — Progress Notes (Signed)
[facility] unable to accept patient for physical therapy services.    [facility] is able and will start care.

## 2022-01-21 ENCOUNTER — Telehealth: Payer: Self-pay | Admitting: Internal Medicine

## 2022-01-21 NOTE — Telephone Encounter (Signed)
Scheduled per 2/28 los, pt has been called and confirmed  ?

## 2022-01-22 ENCOUNTER — Telehealth: Payer: Self-pay | Admitting: *Deleted

## 2022-01-22 NOTE — Telephone Encounter (Signed)
Received call from [facility] in [place] to receive verbal orders  ?1 week X 1 week ?2 weeks X 4 weeks ?1 weeek X 4 weeks ? ?Verbal order provided ? ?Joya Gaskins, Physical Therapist [phone] ?

## 2022-01-24 ENCOUNTER — Other Ambulatory Visit: Payer: Self-pay | Admitting: Physician Assistant

## 2022-01-24 DIAGNOSIS — C7931 Secondary malignant neoplasm of brain: Secondary | ICD-10-CM

## 2022-01-24 DIAGNOSIS — C3412 Malignant neoplasm of upper lobe, left bronchus or lung: Secondary | ICD-10-CM

## 2022-01-24 DIAGNOSIS — R110 Nausea: Secondary | ICD-10-CM

## 2022-01-26 ENCOUNTER — Encounter: Payer: Self-pay | Admitting: Internal Medicine

## 2022-01-26 ENCOUNTER — Other Ambulatory Visit: Payer: Self-pay | Admitting: Internal Medicine

## 2022-01-27 ENCOUNTER — Inpatient Hospital Stay: Payer: 59 | Attending: Internal Medicine | Admitting: Nurse Practitioner

## 2022-01-27 ENCOUNTER — Encounter: Payer: Self-pay | Admitting: Nurse Practitioner

## 2022-01-27 DIAGNOSIS — C7931 Secondary malignant neoplasm of brain: Secondary | ICD-10-CM | POA: Insufficient documentation

## 2022-01-27 DIAGNOSIS — F17210 Nicotine dependence, cigarettes, uncomplicated: Secondary | ICD-10-CM | POA: Insufficient documentation

## 2022-01-27 DIAGNOSIS — Z79899 Other long term (current) drug therapy: Secondary | ICD-10-CM | POA: Insufficient documentation

## 2022-01-27 DIAGNOSIS — Z7982 Long term (current) use of aspirin: Secondary | ICD-10-CM | POA: Diagnosis not present

## 2022-01-27 DIAGNOSIS — Z515 Encounter for palliative care: Secondary | ICD-10-CM | POA: Diagnosis not present

## 2022-01-27 DIAGNOSIS — I10 Essential (primary) hypertension: Secondary | ICD-10-CM | POA: Diagnosis not present

## 2022-01-27 DIAGNOSIS — C3412 Malignant neoplasm of upper lobe, left bronchus or lung: Secondary | ICD-10-CM | POA: Diagnosis not present

## 2022-01-27 DIAGNOSIS — E785 Hyperlipidemia, unspecified: Secondary | ICD-10-CM | POA: Diagnosis not present

## 2022-01-27 DIAGNOSIS — R530 Neoplastic (malignant) related fatigue: Secondary | ICD-10-CM | POA: Diagnosis not present

## 2022-01-27 DIAGNOSIS — Z8546 Personal history of malignant neoplasm of prostate: Secondary | ICD-10-CM | POA: Insufficient documentation

## 2022-01-27 NOTE — Progress Notes (Signed)
? ?  ?Palliative Medicine ?[place]  ?[phone] [phone] ? ? ?Name: Richard Santos ?Date: 01/27/2022 ?MRN: [id number]  ?DOB: 05/29/1961 ? ?Patient Care Team: ?Curt Bears, MD as PCP - General (Oncology) ?Valrie Hart, RN as Oncology Nurse Navigator  ? ?I connected with Richard Santos on 01/27/22 at 12:30 PM EST by telephone and verified that I am speaking with the correct person using two identifiers.  ? ?I discussed the limitations, risks, security and privacy concerns of performing an evaluation and management service by telemedicine and the availability of in-person appointments. I also discussed with the patient that there may be a patient responsible charge related to this service. The patient expressed understanding and agreed to proceed.  ? ?Other persons participating in the visit and their role in the encounter: N/A  ? ?Patient?s location: Home   ?Provider?s location: [facility]   ? ?REASON FOR CONSULTATION: ?Richard Santos is a 61 y.o. 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  Palliative ask to see for symptom management and goals of care.  ? ? ?SOCIAL HISTORY:    ? reports that he has been smoking cigarettes. He has a 12.50 pack-year smoking history. He has never used smokeless tobacco. He reports current alcohol use of about 49.0 standard drinks per week. He reports that he does not use drugs. ? ?ADVANCE DIRECTIVES:  ?Patient has a documented advanced directive. Document visible in Vynca and personally reviewed. His wife Richard Santos and step-daughter, Richard Santos.  ? ?CODE STATUS: Full code ? ?PAST MEDICAL HISTORY: ?Past Medical History:  ?Diagnosis Date  ? Allergic rhinitis   ? Allergy   ? Anxiety   ? Chronic low back pain   ? DDD (degenerative disc disease), lumbar   ? ED (erectile dysfunction)   ? Finger injury   ? cut pads off  3rd and 4th finger left hand/due to lawn mower accident  ? GERD (gastroesophageal reflux disease)   ? Heart murmur   ? as child only-   ? History of kidney stones   ? HTN (hypertension) 08/20/2016  ? Hyperlipidemia   ? Incomplete right bundle branch block   ? Prostate cancer [facility]) dx 10/02/14  ? stage T1c  ? Sigmoid diverticulosis   ? Small cell lung cancer ([place]) 03/2020  ? Wears glasses   ? ? ?PAST SURGICAL HISTORY:  ?Past Surgical History:  ?Procedure Laterality Date  ? BRONCHIAL BIOPSY  04/12/2020  ? Procedure: BRONCHIAL BIOPSIES;  Surgeon: Marshell Garfinkel, MD;  Location: [facility];  Service: Cardiopulmonary;;  ? BRONCHIAL BRUSHINGS  04/12/2020  ? Procedure: BRONCHIAL BRUSHINGS;  Surgeon: Marshell Garfinkel, MD;  Location: [place] [facility];  Service: Cardiopulmonary;;  ? BRONCHIAL NEEDLE ASPIRATION BIOPSY  04/12/2020  ? Procedure: BRONCHIAL NEEDLE ASPIRATION BIOPSIES;  Surgeon: Marshell Garfinkel, MD;  Location: [place];  Service: Cardiopulmonary;;  ? BRONCHIAL WASHINGS  04/12/2020  ? Procedure: BRONCHIAL WASHINGS;  Surgeon: Marshell Garfinkel, MD;  Location: [place] [facility];  Service: Cardiopulmonary;;  ? COLONOSCOPY    ? COLONOSCOPY W/ POLYPECTOMY  04-19-2012  ? ENDOBRONCHIAL ULTRASOUND N/A 04/12/2020  ? Procedure: ENDOBRONCHIAL ULTRASOUND;  Surgeon: Marshell Garfinkel, MD;  Location: [place] [facility];  Service: Cardiopulmonary;  Laterality: N/A;  ? ESOPHAGOGASTRODUODENOSCOPY  08-05-2010  ? HEMOSTASIS CONTROL  04/12/2020  ? Procedure: HEMOSTASIS CONTROL;  Surgeon: Marshell Garfinkel, MD;  Location: [facility];  Service: Cardiopulmonary;;  ? IR IMAGING GUIDED PORT  INSERTION  05/31/2020  ? POLYPECTOMY    ? PROSTATE BIOPSY  10/02/14  ? RADIOACTIVE SEED IMPLANT N/A 01/30/2015  ? Procedure: RADIOACTIVE SEED IMPLANT;  Surgeon: Rana Snare, MD;  Location: [facility]  Service: Urology;  Laterality: N/A;  ? UPPER GASTROINTESTINAL ENDOSCOPY  04/03/2021  ? VIDEO BRONCHOSCOPY N/A 04/12/2020  ? Procedure: VIDEO BRONCHOSCOPY WITHOUT  FLUORO;  Surgeon: Marshell Garfinkel, MD;  Location: [place];  Service: Cardiopulmonary;  Laterality: N/A;  ? ? ?ALLERGIES:  is allergic to bee venom, elemental sulfur, other, sulfa antibiotics, and namenda [memantine]. ? ?MEDICATIONS:  ?Current Outpatient Medications  ?Medication Sig Dispense Refill  ? aspirin EC 81 MG tablet Take 81 mg by mouth daily. Swallow whole.    ? Budeson-Glycopyrrol-Formoterol (BREZTRI AEROSPHERE) 160-9-4.8 MCG/ACT AERO Inhale 2 puffs into the lungs in the morning and at bedtime. 10.7 g 11  ? busPIRone (BUSPAR) 10 MG tablet TAKE 1 TABLET BY MOUTH TWICE A DAY 180 tablet 2  ? dexamethasone (DECADRON) 1 MG tablet Take 2 tablets (2 mg total) by mouth daily with breakfast for 7 days, THEN 1 tablet (1 mg total) daily with breakfast for 7 days. 21 tablet 0  ? divalproex (DEPAKOTE) 250 MG DR tablet Take 1 tablet (250 mg total) by mouth 2 (two) times daily. 60 tablet 2  ? famotidine (PEPCID) 20 MG tablet Take 1 tablet (20 mg total) by mouth 2 (two) times daily. 60 tablet 2  ? lovastatin (MEVACOR) 20 MG tablet Take 1 tablet (20 mg total) by mouth at bedtime. 90 tablet 3  ? Multiple Vitamin (MULTIVITAMIN) capsule Take 1 capsule by mouth daily.    ? ondansetron (ZOFRAN-ODT) 8 MG disintegrating tablet TAKE 1 TABLET BY MOUTH EVERY 8 HOURS AS NEEDED FOR NAUSEA AND VOMITING 18 tablet 4  ? pantoprazole (PROTONIX) 40 MG tablet Take 40 mg by mouth every morning.    ? prochlorperazine (COMPAZINE) 10 MG tablet TAKE 1 TABLET BY MOUTH EVERY 6 HOURS AS NEEDED 30 tablet 2  ? sodium bicarbonate 650 MG tablet TAKE 1 TABLET BY MOUTH TWICE A DAY 180 tablet 1  ? traMADol (ULTRAM) 50 MG tablet TAKE 1/2 TABLET BY MOUTH TWICE A DAY AS NEEDED FOR PAIN 60 tablet 2  ? ?No current facility-administered medications for this visit.  ? ?Facility-Administered Medications Ordered in Other Visits  ?Medication Dose Route Frequency Provider Last Rate Last Admin  ? heparin lock flush 100 unit/mL  500 Units Intracatheter Daily PRN  Curt Bears, MD      ? sodium chloride flush (NS) 0.9 % injection 10 mL  10 mL Intracatheter PRN Curt Bears, MD      ? sodium chloride flush (NS) 0.9 % injection 10 mL  10 mL Intracatheter PRN Curt Bears, MD   10 mL at 07/16/21 1606  ? ? ?VITAL SIGNS: ?There were no vitals taken for this visit. ?There were no vitals filed for this visit. ?  ?Estimated body mass index is 19.11 kg/m? as calculated from the following: ?  Height as of 01/07/22: 6' (1.829 m). ?  Weight as of 01/20/22: 140 lb 14.4 oz (63.9 kg). ? ?PERFORMANCE STATUS (ECOG) : 2 - Symptomatic, <50% confined to bed ? ?IMPRESSION: ? ?I connected with Mr. Lema via telephone call today for follow-up. Denies any acute distress or changes. Feels that he is somewhat improved in regards to fatigue and appetite.  ? ?He is able to perform most ADLs independently. Denies pain or discomfort. No recent falls.  ? ?His  constipation is improving with daily use of Miralax.  ? ?I discussed the importance of continued conversation with family and their medical providers regarding overall plan of care and treatment options, ensuring decisions are within the context of the patients values and GOCs. ? ?PLAN: ?Miralax 1-2 times daily for constipation. Education provided on increased water and protein intake.  ?Appetite much improved.  ?No other symptom management needs. Ongoing support and goals of care discussions.  ?I will plan to follow-up in 3-4 weeks in collaboration to other oncology appointments. Sooner if needed.  ? ? ?Patient expressed understanding and was in agreement with this plan. He also understands that He can call the clinic at any time with any questions, concerns, or complaints.  ? ?Time Total: 35 min.  ? ?Visit consisted of counseling and education dealing with the complex and emotionally intense issues of symptom management and palliative care in the setting of serious and potentially life-threatening illness.Greater than 50%  of this time  was spent counseling and coordinating care related to the above assessment and plan. ? ?Alda Lea, AGPCNP-BC  ?[place] ? ?  ?

## 2022-02-01 ENCOUNTER — Encounter: Payer: Self-pay | Admitting: Internal Medicine

## 2022-02-02 ENCOUNTER — Other Ambulatory Visit: Payer: Self-pay | Admitting: *Deleted

## 2022-02-02 ENCOUNTER — Encounter: Payer: Self-pay | Admitting: Internal Medicine

## 2022-02-02 ENCOUNTER — Telehealth: Payer: Self-pay | Admitting: *Deleted

## 2022-02-02 DIAGNOSIS — C7931 Secondary malignant neoplasm of brain: Secondary | ICD-10-CM

## 2022-02-02 NOTE — Progress Notes (Signed)
Richard Santos with [facility] to cancel services and transferred to [facility] for Physical Therapy and Occupational Therapy. ? ?Orders placed.   ?

## 2022-02-02 NOTE — Telephone Encounter (Signed)
Received call from Joya Gaskins Physical Therapist with [facility].  [phone] ? ?She called to advise that patient report fall in the home without any injury. ?

## 2022-02-03 ENCOUNTER — Emergency Department (HOSPITAL_COMMUNITY): Payer: 59

## 2022-02-03 ENCOUNTER — Encounter (HOSPITAL_COMMUNITY): Payer: Self-pay

## 2022-02-03 ENCOUNTER — Inpatient Hospital Stay (HOSPITAL_COMMUNITY)
Admission: EM | Admit: 2022-02-03 | Discharge: 2022-02-06 | DRG: 312 | Disposition: A | Discharge status: Rehabilitation Facility | Payer: 59 | Attending: Student | Admitting: Student

## 2022-02-03 ENCOUNTER — Other Ambulatory Visit: Payer: Self-pay

## 2022-02-03 DIAGNOSIS — E43 Unspecified severe protein-calorie malnutrition: Secondary | ICD-10-CM | POA: Diagnosis present

## 2022-02-03 DIAGNOSIS — Y92009 Unspecified place in unspecified non-institutional (private) residence as the place of occurrence of the external cause: Secondary | ICD-10-CM

## 2022-02-03 DIAGNOSIS — Z888 Allergy status to other drugs, medicaments and biological substances status: Secondary | ICD-10-CM

## 2022-02-03 DIAGNOSIS — Z789 Other specified health status: Secondary | ICD-10-CM

## 2022-02-03 DIAGNOSIS — R531 Weakness: Secondary | ICD-10-CM | POA: Diagnosis not present

## 2022-02-03 DIAGNOSIS — F419 Anxiety disorder, unspecified: Secondary | ICD-10-CM | POA: Diagnosis present

## 2022-02-03 DIAGNOSIS — Z91030 Bee allergy status: Secondary | ICD-10-CM

## 2022-02-03 DIAGNOSIS — Z9181 History of falling: Secondary | ICD-10-CM

## 2022-02-03 DIAGNOSIS — C61 Malignant neoplasm of prostate: Secondary | ICD-10-CM | POA: Diagnosis present

## 2022-02-03 DIAGNOSIS — G8929 Other chronic pain: Secondary | ICD-10-CM | POA: Diagnosis present

## 2022-02-03 DIAGNOSIS — C7931 Secondary malignant neoplasm of brain: Secondary | ICD-10-CM | POA: Diagnosis present

## 2022-02-03 DIAGNOSIS — F1010 Alcohol abuse, uncomplicated: Secondary | ICD-10-CM | POA: Diagnosis present

## 2022-02-03 DIAGNOSIS — S2241XA Multiple fractures of ribs, right side, initial encounter for closed fracture: Secondary | ICD-10-CM | POA: Diagnosis present

## 2022-02-03 DIAGNOSIS — R29898 Other symptoms and signs involving the musculoskeletal system: Principal | ICD-10-CM

## 2022-02-03 DIAGNOSIS — M5137 Other intervertebral disc degeneration, lumbosacral region: Secondary | ICD-10-CM | POA: Diagnosis present

## 2022-02-03 DIAGNOSIS — C3412 Malignant neoplasm of upper lobe, left bronchus or lung: Secondary | ICD-10-CM | POA: Diagnosis present

## 2022-02-03 DIAGNOSIS — Z882 Allergy status to sulfonamides status: Secondary | ICD-10-CM

## 2022-02-03 DIAGNOSIS — I951 Orthostatic hypotension: Secondary | ICD-10-CM | POA: Diagnosis not present

## 2022-02-03 DIAGNOSIS — M47817 Spondylosis without myelopathy or radiculopathy, lumbosacral region: Secondary | ICD-10-CM | POA: Diagnosis present

## 2022-02-03 DIAGNOSIS — Z87442 Personal history of urinary calculi: Secondary | ICD-10-CM

## 2022-02-03 DIAGNOSIS — M25561 Pain in right knee: Secondary | ICD-10-CM

## 2022-02-03 DIAGNOSIS — J939 Pneumothorax, unspecified: Secondary | ICD-10-CM | POA: Diagnosis present

## 2022-02-03 DIAGNOSIS — M545 Low back pain, unspecified: Secondary | ICD-10-CM | POA: Diagnosis present

## 2022-02-03 DIAGNOSIS — Z8249 Family history of ischemic heart disease and other diseases of the circulatory system: Secondary | ICD-10-CM

## 2022-02-03 DIAGNOSIS — Z20822 Contact with and (suspected) exposure to covid-19: Secondary | ICD-10-CM | POA: Diagnosis present

## 2022-02-03 DIAGNOSIS — C7951 Secondary malignant neoplasm of bone: Secondary | ICD-10-CM | POA: Diagnosis present

## 2022-02-03 DIAGNOSIS — Z7951 Long term (current) use of inhaled steroids: Secondary | ICD-10-CM

## 2022-02-03 DIAGNOSIS — E222 Syndrome of inappropriate secretion of antidiuretic hormone: Secondary | ICD-10-CM | POA: Diagnosis present

## 2022-02-03 DIAGNOSIS — S2249XA Multiple fractures of ribs, unspecified side, initial encounter for closed fracture: Secondary | ICD-10-CM

## 2022-02-03 DIAGNOSIS — Z923 Personal history of irradiation: Secondary | ICD-10-CM

## 2022-02-03 DIAGNOSIS — F17210 Nicotine dependence, cigarettes, uncomplicated: Secondary | ICD-10-CM | POA: Diagnosis present

## 2022-02-03 DIAGNOSIS — R339 Retention of urine, unspecified: Secondary | ICD-10-CM | POA: Diagnosis present

## 2022-02-03 DIAGNOSIS — E871 Hypo-osmolality and hyponatremia: Secondary | ICD-10-CM | POA: Diagnosis present

## 2022-02-03 DIAGNOSIS — Z7982 Long term (current) use of aspirin: Secondary | ICD-10-CM

## 2022-02-03 DIAGNOSIS — Z720 Tobacco use: Secondary | ICD-10-CM | POA: Diagnosis present

## 2022-02-03 DIAGNOSIS — Z79899 Other long term (current) drug therapy: Secondary | ICD-10-CM

## 2022-02-03 DIAGNOSIS — E785 Hyperlipidemia, unspecified: Secondary | ICD-10-CM | POA: Diagnosis present

## 2022-02-03 DIAGNOSIS — Z681 Body mass index (BMI) 19 or less, adult: Secondary | ICD-10-CM

## 2022-02-03 DIAGNOSIS — W19XXXA Unspecified fall, initial encounter: Secondary | ICD-10-CM | POA: Diagnosis present

## 2022-02-03 DIAGNOSIS — R5381 Other malaise: Secondary | ICD-10-CM | POA: Diagnosis present

## 2022-02-03 DIAGNOSIS — Z8546 Personal history of malignant neoplasm of prostate: Secondary | ICD-10-CM

## 2022-02-03 DIAGNOSIS — D61818 Other pancytopenia: Secondary | ICD-10-CM | POA: Diagnosis present

## 2022-02-03 DIAGNOSIS — S80212A Abrasion, left knee, initial encounter: Secondary | ICD-10-CM | POA: Diagnosis present

## 2022-02-03 DIAGNOSIS — C3490 Malignant neoplasm of unspecified part of unspecified bronchus or lung: Secondary | ICD-10-CM

## 2022-02-03 DIAGNOSIS — R296 Repeated falls: Secondary | ICD-10-CM

## 2022-02-03 DIAGNOSIS — I10 Essential (primary) hypertension: Secondary | ICD-10-CM | POA: Diagnosis present

## 2022-02-03 DIAGNOSIS — K219 Gastro-esophageal reflux disease without esophagitis: Secondary | ICD-10-CM | POA: Diagnosis present

## 2022-02-03 DIAGNOSIS — I4510 Unspecified right bundle-branch block: Secondary | ICD-10-CM | POA: Diagnosis present

## 2022-02-03 DIAGNOSIS — Z8042 Family history of malignant neoplasm of prostate: Secondary | ICD-10-CM

## 2022-02-03 LAB — COMPREHENSIVE METABOLIC PANEL
ALT: 28 U/L (ref 0–44)
AST: 27 U/L (ref 15–41)
Albumin: 3.7 g/dL (ref 3.5–5.0)
Alkaline Phosphatase: 40 U/L (ref 38–126)
Anion gap: 10 (ref 5–15)
BUN: 31 mg/dL — ABNORMAL HIGH (ref 6–20)
CO2: 26 mmol/L (ref 22–32)
Calcium: 9.1 mg/dL (ref 8.9–10.3)
Chloride: 98 mmol/L (ref 98–111)
Creatinine, Ser: 1.23 mg/dL (ref 0.61–1.24)
GFR, Estimated: >60 mL/min (ref >60)
Glucose, Bld: 103 mg/dL — ABNORMAL HIGH (ref 70–99)
Potassium: 3.9 mmol/L (ref 3.5–5.1)
Sodium: 134 mmol/L — ABNORMAL LOW (ref 135–145)
Total Bilirubin: 0.5 mg/dL (ref 0.3–1.2)
Total Protein: 6.7 g/dL (ref 6.5–8.1)

## 2022-02-03 LAB — URINALYSIS, ROUTINE W REFLEX MICROSCOPIC
Bilirubin Urine: NEGATIVE
Glucose, UA: NEGATIVE mg/dL
Hgb urine dipstick: NEGATIVE
Ketones, ur: 5 mg/dL — AB
Leukocytes,Ua: NEGATIVE
Nitrite: NEGATIVE
Protein, ur: NEGATIVE mg/dL
Specific Gravity, Urine: 1.019 (ref 1.005–1.030)
pH: 6 (ref 5.0–8.0)

## 2022-02-03 LAB — CBC WITH DIFFERENTIAL/PLATELET
Abs Immature Granulocytes: 0.07 10*3/uL (ref 0.00–0.07)
Basophils Absolute: 0 10*3/uL (ref 0.0–0.1)
Basophils Relative: 0 %
Eosinophils Absolute: 0 10*3/uL (ref 0.0–0.5)
Eosinophils Relative: 0 %
HCT: 30.3 % — ABNORMAL LOW (ref 39.0–52.0)
Hemoglobin: 10.6 g/dL — ABNORMAL LOW (ref 13.0–17.0)
Immature Granulocytes: 1 %
Lymphocytes Relative: 11 %
Lymphs Abs: 0.5 10*3/uL — ABNORMAL LOW (ref 0.7–4.0)
MCH: 36.6 pg — ABNORMAL HIGH (ref 26.0–34.0)
MCHC: 35 g/dL (ref 30.0–36.0)
MCV: 104.5 fL — ABNORMAL HIGH (ref 80.0–100.0)
Monocytes Absolute: 0.5 10*3/uL (ref 0.1–1.0)
Monocytes Relative: 9 %
Neutro Abs: 4 10*3/uL (ref 1.7–7.7)
Neutrophils Relative %: 79 %
Platelets: 146 10*3/uL — ABNORMAL LOW (ref 150–400)
RBC: 2.9 MIL/uL — ABNORMAL LOW (ref 4.22–5.81)
RDW: 13.5 % (ref 11.5–15.5)
WBC: 5.1 10*3/uL (ref 4.0–10.5)
nRBC: 0 % (ref 0.0–0.2)

## 2022-02-03 LAB — RESP PANEL BY RT-PCR (FLU A&B, COVID) ARPGX2
Influenza A by PCR: NEGATIVE
Influenza B by PCR: NEGATIVE
SARS Coronavirus 2 by RT PCR: NEGATIVE

## 2022-02-03 LAB — VALPROIC ACID LEVEL: Valproic Acid Lvl: 44 ug/mL — ABNORMAL LOW (ref 50.0–100.0)

## 2022-02-03 MED ORDER — BUDESON-GLYCOPYRROL-FORMOTEROL 160-9-4.8 MCG/ACT IN AERO: 2.0000 | INHALATION_SPRAY | Freq: Two times a day (BID) | RESPIRATORY_TRACT | Status: DC

## 2022-02-03 MED ORDER — ADULT MULTIVITAMIN W/MINERALS CH
1.0000 | ORAL_TABLET | Freq: Every day | ORAL | Status: DC
  Administered 2022-02-04 – 2022-02-06 (×3): 1 via ORAL
  Filled 2022-02-03 (×3): qty 1

## 2022-02-03 MED ORDER — UMECLIDINIUM BROMIDE 62.5 MCG/ACT IN AEPB
1.0000 | INHALATION_SPRAY | Freq: Every day | RESPIRATORY_TRACT | Status: DC
  Administered 2022-02-04 – 2022-02-06 (×3): 1 via RESPIRATORY_TRACT
  Filled 2022-02-03: qty 7

## 2022-02-03 MED ORDER — LACTATED RINGERS IV SOLN: INTRAVENOUS | Status: DC

## 2022-02-03 MED ORDER — ONDANSETRON HCL 4 MG/2ML IJ SOLN: 4.0000 mg | Freq: Four times a day (QID) | INTRAMUSCULAR | Status: DC | PRN

## 2022-02-03 MED ORDER — DEXAMETHASONE 2 MG PO TABS
2.0000 mg | ORAL_TABLET | Freq: Once | ORAL | Status: AC
  Administered 2022-02-03: 2 mg via ORAL
  Filled 2022-02-03: qty 1

## 2022-02-03 MED ORDER — DIVALPROEX SODIUM 250 MG PO DR TAB
250.0000 mg | DELAYED_RELEASE_TABLET | Freq: Two times a day (BID) | ORAL | Status: DC
  Administered 2022-02-04 – 2022-02-06 (×6): 250 mg via ORAL
  Filled 2022-02-03 (×6): qty 1

## 2022-02-03 MED ORDER — DEXAMETHASONE SODIUM PHOSPHATE 4 MG/ML IJ SOLN
2.0000 mg | Freq: Two times a day (BID) | INTRAMUSCULAR | Status: DC
  Administered 2022-02-04 – 2022-02-05 (×3): 2 mg via INTRAVENOUS
  Filled 2022-02-03 (×3): qty 1

## 2022-02-03 MED ORDER — ONDANSETRON HCL 4 MG PO TABS: 4.0000 mg | ORAL_TABLET | Freq: Four times a day (QID) | ORAL | Status: DC | PRN

## 2022-02-03 MED ORDER — IPRATROPIUM-ALBUTEROL 0.5-2.5 (3) MG/3ML IN SOLN: 3.0000 mL | Freq: Four times a day (QID) | RESPIRATORY_TRACT | Status: DC | PRN

## 2022-02-03 MED ORDER — ACETAMINOPHEN 325 MG PO TABS
650.0000 mg | ORAL_TABLET | Freq: Four times a day (QID) | ORAL | Status: DC | PRN
  Administered 2022-02-04: 650 mg via ORAL
  Filled 2022-02-03: qty 2

## 2022-02-03 MED ORDER — THIAMINE HCL 100 MG/ML IJ SOLN: 100.0000 mg | Freq: Every day | INTRAMUSCULAR | Status: DC

## 2022-02-03 MED ORDER — THIAMINE HCL 100 MG PO TABS
100.0000 mg | ORAL_TABLET | Freq: Every day | ORAL | Status: DC
  Administered 2022-02-04 – 2022-02-06 (×3): 100 mg via ORAL
  Filled 2022-02-03 (×3): qty 1

## 2022-02-03 MED ORDER — FOLIC ACID 1 MG PO TABS
1.0000 mg | ORAL_TABLET | Freq: Every day | ORAL | Status: DC
  Administered 2022-02-04 – 2022-02-06 (×3): 1 mg via ORAL
  Filled 2022-02-03 (×3): qty 1

## 2022-02-03 MED ORDER — SENNOSIDES-DOCUSATE SODIUM 8.6-50 MG PO TABS: 1.0000 | ORAL_TABLET | Freq: Every evening | ORAL | Status: DC | PRN

## 2022-02-03 MED ORDER — LORAZEPAM 2 MG/ML IJ SOLN: 1.0000 mg | INTRAMUSCULAR | Status: DC | PRN

## 2022-02-03 MED ORDER — MOMETASONE FURO-FORMOTEROL FUM 100-5 MCG/ACT IN AERO
2.0000 | INHALATION_SPRAY | Freq: Two times a day (BID) | RESPIRATORY_TRACT | Status: DC
  Administered 2022-02-04 – 2022-02-06 (×4): 2 via RESPIRATORY_TRACT
  Filled 2022-02-03: qty 8.8

## 2022-02-03 MED ORDER — LORAZEPAM 1 MG PO TABS: 1.0000 mg | ORAL_TABLET | ORAL | Status: DC | PRN

## 2022-02-03 MED ORDER — ASPIRIN EC 81 MG PO TBEC
81.0000 mg | DELAYED_RELEASE_TABLET | Freq: Every day | ORAL | Status: DC
Start: 2022-02-04 — End: 2022-02-06
  Administered 2022-02-04 – 2022-02-06 (×3): 81 mg via ORAL
  Filled 2022-02-03 (×3): qty 1

## 2022-02-03 MED ORDER — ENOXAPARIN SODIUM 40 MG/0.4ML IJ SOSY
40.0000 mg | PREFILLED_SYRINGE | INTRAMUSCULAR | Status: DC
  Administered 2022-02-04 – 2022-02-06 (×3): 40 mg via SUBCUTANEOUS
  Filled 2022-02-03 (×3): qty 0.4

## 2022-02-03 MED ORDER — ACETAMINOPHEN 650 MG RE SUPP: 650.0000 mg | Freq: Four times a day (QID) | RECTAL | Status: DC | PRN

## 2022-02-03 NOTE — Assessment & Plan Note (Addendum)
Continues to drink 3 beers a day. Used to drink more.  No withdrawal symptoms. ?-Continue multivitamin, thiamine and folic acid ?

## 2022-02-03 NOTE — ED Triage Notes (Addendum)
Patient is a cancer patient with a history of brain and small cell lung cancer. Patient's wife reports that the patient has had right leg weakness and states that the patient has fallen 4 times today. ?Patient's wife reports that the patient did not hit his head. ? ?Patient reports for the past 2-3 days he feels like he "needs to urinate, but nothing comes out." ?

## 2022-02-03 NOTE — Assessment & Plan Note (Addendum)
Does not seem to be on medication for this. ?-Continue monitoring ?

## 2022-02-03 NOTE — Assessment & Plan Note (Addendum)
Continues to smoke a few cigarettes a day ?Encourage cessation ?Nicotine patch if needed ?

## 2022-02-03 NOTE — H&P (Signed)
?History and Physical  ? ? ?Patient: Richard Santos SWN:[id number] DOB: May 09, 1961 ?DOA: 02/03/2022 ?DOS: the patient was seen and examined on 02/03/2022 ?PCP: Curt Bears, MD  ?Patient coming from: Home ? ?Chief Complaint:  ?Chief Complaint  ?Patient presents with  ? Weakness  ? Fall  ? Urinary Retention  ? ?HPI: [name] is a 61 y.o. 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  He is followed by Dr. Mickeal Skinner for brain metastasis in the recently had whole brain radiation last month.  He has been on Decadron since the radiation and dose is being tapered.  He went from 2 mg a day to 1 mg a day a week ago.  For the last 2 days he has had increased weakness and decreased ability to walk with increased weakness in the right leg and he has been more unsteady and dragging his right leg.  He has had multiple falls over the last few days.  Wife states he is fallen 4 times today.  No loss of consciousness.  He does report he hit his head once on the floor but was very light and has no residual headache and no injury from the falls.  He has not had any fever, cough, nausea vomiting or diarrhea. ? ?Review of Systems: As mentioned in the history of present illness. All other systems reviewed and are negative. ?Past Medical History:  ?Diagnosis Date  ? Allergic rhinitis   ? Allergy   ? Anxiety   ? Chronic low back pain   ? DDD (degenerative disc disease), lumbar   ? ED (erectile dysfunction)   ? Finger injury   ? cut pads off 3rd and 4th finger left hand/due to lawn mower accident  ? GERD (gastroesophageal reflux disease)   ? Heart murmur   ? as child only-   ? History of kidney stones   ? HTN (hypertension) 08/20/2016  ? Hyperlipidemia   ? Incomplete right bundle branch block   ? Prostate cancer [facility]) dx 10/02/14  ? stage T1c  ? Sigmoid diverticulosis   ? Small cell  lung cancer ([place]) 03/2020  ? Wears glasses   ? ?Past Surgical History:  ?Procedure Laterality Date  ? BRONCHIAL BIOPSY  04/12/2020  ? Procedure: BRONCHIAL BIOPSIES;  Surgeon: Marshell Garfinkel, MD;  Location: [facility];  Service: Cardiopulmonary;;  ? BRONCHIAL BRUSHINGS  04/12/2020  ? Procedure: BRONCHIAL BRUSHINGS;  Surgeon: Marshell Garfinkel, MD;  Location: [place];  Service: Cardiopulmonary;;  ? BRONCHIAL NEEDLE ASPIRATION BIOPSY  04/12/2020  ? Procedure: BRONCHIAL NEEDLE ASPIRATION BIOPSIES;  Surgeon: Marshell Garfinkel, MD;  Location: [place];  Service: Cardiopulmonary;;  ? BRONCHIAL WASHINGS  04/12/2020  ? Procedure: BRONCHIAL WASHINGS;  Surgeon: Marshell Garfinkel, MD;  Location: [place] [facility];  Service: Cardiopulmonary;;  ? COLONOSCOPY    ? COLONOSCOPY W/ POLYPECTOMY  04-19-2012  ? ENDOBRONCHIAL ULTRASOUND N/A 04/12/2020  ? Procedure: ENDOBRONCHIAL ULTRASOUND;  Surgeon: Marshell Garfinkel, MD;  Location: [place] [facility];  Service: Cardiopulmonary;  Laterality: N/A;  ? ESOPHAGOGASTRODUODENOSCOPY  08-05-2010  ? HEMOSTASIS CONTROL  04/12/2020  ? Procedure: HEMOSTASIS CONTROL;  Surgeon: Marshell Garfinkel, MD;  Location: [place] [facility];  Service: Cardiopulmonary;;  ? IR IMAGING GUIDED PORT INSERTION  05/31/2020  ? POLYPECTOMY    ? PROSTATE BIOPSY  10/02/14  ? RADIOACTIVE SEED IMPLANT N/A 01/30/2015  ? Procedure: RADIOACTIVE SEED IMPLANT;  Surgeon: Rana Snare, MD;  Location:  ;  Service: Urology;  Laterality: N/A;  ? UPPER GASTROINTESTINAL ENDOSCOPY  04/03/2021  ? VIDEO BRONCHOSCOPY N/A 04/12/2020  ? Procedure: VIDEO BRONCHOSCOPY WITHOUT FLUORO;  Surgeon: Marshell Garfinkel, MD;  Location: [place];  Service: Cardiopulmonary;  Laterality: N/A;  ? ?Social History:  reports that he has been smoking cigarettes. He has a 3.75 pack-year smoking history. He has never used smokeless tobacco. He reports current alcohol use of about 49.0 standard drinks per week. He reports that he does not use drugs. ? ?Allergies   ?Allergen Reactions  ? Bee Venom Swelling  ? Elemental Sulfur Other (See Comments)  ?  Acute renal failure  ? Other Other (See Comments)  ?  Chemo medicine- patient not sure about name - swelling & rashes  ? Sulfa Antibiotics   ? Namenda [Memantine] Nausea Only  ?  Nausea and fatigue  ? ? ?Family History  ?Problem Relation Age of Onset  ? Prostate cancer Father   ? Heart disease Father   ? Heart disease Mother   ? Breast cancer Sister   ? Heart disease Other   ? Colon cancer Neg Hx   ? Colon polyps Neg Hx   ? Esophageal cancer Neg Hx   ? Rectal cancer Neg Hx   ? Stomach cancer Neg Hx   ? ? ?Prior to Admission medications   ?Medication Sig Start Date End Date Taking? Authorizing Provider  ?aspirin EC 81 MG tablet Take 81 mg by mouth daily. Swallow whole.    [provider]  ?Budeson-Glycopyrrol-Formoterol (BREZTRI AEROSPHERE) 160-9-4.8 MCG/ACT AERO Inhale 2 puffs into the lungs in the morning and at bedtime. 08/26/21   Marshell Garfinkel, MD  ?busPIRone (BUSPAR) 10 MG tablet TAKE 1 TABLET BY MOUTH TWICE A DAY 01/12/22   [name], MD  ?dexamethasone (DECADRON) 1 MG tablet Take 2 tablets (2 mg total) by mouth daily with breakfast for 7 days, THEN 1 tablet (1 mg total) daily with breakfast for 7 days. 01/20/22 02/03/22  Ventura Sellers, MD  ?divalproex (DEPAKOTE) 250 MG DR tablet Take 1 tablet (250 mg total) by mouth 2 (two) times daily. 12/02/21   Ventura Sellers, MD  ?famotidine (PEPCID) 20 MG tablet Take 1 tablet (20 mg total) by mouth 2 (two) times daily. 05/09/21   [name], MD  ?lovastatin (MEVACOR) 20 MG tablet Take 1 tablet (20 mg total) by mouth at bedtime. 09/17/21   [name], MD  ?Multiple Vitamin (MULTIVITAMIN) capsule Take 1 capsule by mouth daily.    [provider]  ?ondansetron (ZOFRAN-ODT) 8 MG disintegrating tablet TAKE 1 TABLET BY MOUTH EVERY 8 HOURS AS NEEDED FOR NAUSEA AND VOMITING 01/25/22   Heilingoetter, Cassandra L, PA-C  ?pantoprazole (PROTONIX) 40 MG tablet Take 40  mg by mouth every morning. 06/08/21   [provider]  ?prochlorperazine (COMPAZINE) 10 MG tablet TAKE 1 TABLET BY MOUTH EVERY 6 HOURS AS NEEDED 12/15/21   Heilingoetter, Cassandra L, PA-C  ?sodium bicarbonate 650 MG tablet TAKE 1 TABLET BY MOUTH TWICE A DAY 12/03/21   Curt Bears, MD  ?traMADol (ULTRAM) 50 MG tablet TAKE 1/2 TABLET BY MOUTH TWICE A DAY AS NEEDED FOR PAIN 11/06/21   [name], MD  ? ? ?Physical Exam: ?Vitals:  ? 02/03/22 2015 02/03/22 2030 02/03/22 2045 02/03/22 2115  ?BP: 119/86 119/80 118/82 119/80  ?Pulse: 79 79 78 77  ?Resp: 18 17 17 18   ?Temp:      ?TempSrc:      ?  SpO2: 100% 100% 100% 100%  ?Weight:      ?Height:      ? ?General: WDWN, Alert and oriented x3.  ?Eyes: EOMI, PERRL, conjunctivae normal. Sclera nonicteric ?HENT:  [place]/AT, external ears normal.  Nares patent without epistasis.  Mucous membranes are moist. ?Neck: Soft, normal range of motion, supple, no masses, Trachea midline ?Respiratory: Equal breath sounds with diffuse rales. no wheezing, no crackles. Normal respiratory effort.  ?Cardiovascular: Regular rate and rhythm, no murmurs / rubs / gallops. No extremity edema. 2+ pedal pulses.  ?Port-a-cath in right upper chest ?Abdomen: Soft, no tenderness, nondistended, no rebound or guarding.  No masses palpated. Bowel sounds normoactive ?Musculoskeletal: FROM. no cyanosis. Thin extremities ?Skin: Warm, dry, intact no rashes, lesions, ulcers. No induration ?Neurologic: CN 2-12 grossly intact.  Normal speech.  Sensation diminished in legs/feet. patella DTR +1 bilaterally. Strength 3/5 in lower extremities. Grip strength 4/5 bilaterally   ?Psychiatric:Normal mood.  ? ?Data Reviewed: ?Lab Work: WBC 5100 hemoglobin 10.6 hematocrit 30.3 platelet 146,000 sodium 134 potassium 3.9 chloride 98 bicarb 26 creatinine 1.23 BUN 31 glucose 103 calcium 9.1 albumin 3.7 LFTs normal.  Valproic acid level 44 ?Alysis negative except for mild ketones COVID-negative.  Influenza A and B  negative ? ?CT of the head without contrast shows no acute infarct or hemorrhage.  Innumerable brain mets seen again that were on previous MRI but are not well visualized on noncontrast CT.  Hypodensities in the p

## 2022-02-03 NOTE — Assessment & Plan Note (Addendum)
Reportedly, weakness worsened when decadron was reduced to 1 mg a day a week ago.  Weakness is more pronounced in RLE.  Some foraminal impingement at L5-S1 on CT abdomen and pelvis. Orthostatic vitals positive but patient denied dizziness.  CT head without contrast without acute finding.  Patient has SCLC with brain mets.  Started on IV Decadron 2 mg twice daily.  Repeat MRI brain with decrease in size of tumor burden.  ?-Apply TED hose.  Consider right knee brace.  ?-Continue p.o. Decadron 2 mg twice daily ?-Continue home Depakote for seizure prophylaxis. ?-Stopped tramadol on discharge-for lower his seizure threshold. ?-Continue PT/OT-recommended CIR. ?-Fall precautions ?

## 2022-02-03 NOTE — ED Provider Notes (Signed)
?[facility] DEPT ?Provider Note ? ? ?CSN: [id number] ?Arrival date & time: 02/03/22  1805 ? ?  ? ?History ? ?Chief Complaint  ?Patient presents with  ? Weakness  ? Fall  ? Urinary Retention  ? ? ?Richard Santos is a 61 y.o. male.  He has a history of lung cancer with mets to brain.  He follows with Dr. Mickeal Skinner and has received whole brain radiation.  Wife tells me that he has been tapering off of steroids.  For the last 2 days he has had decreased ability to use his right leg and has been more unsteady.  2 falls yesterday and 4 falls today.  No loss of consciousness.  He denies any injury from a fall other than some tightness in his back. ? ?The history is provided by the patient and the spouse.  ?Fall ?This is a new problem. The current episode started yesterday. The problem occurs daily. The problem has not changed since onset.Pertinent negatives include no chest pain, no abdominal pain, no headaches and no shortness of breath. The symptoms are aggravated by walking. The symptoms are relieved by rest. He has tried nothing for the symptoms. The treatment provided no relief.  ?Extremity Weakness ?This is a new problem. The current episode started yesterday. The problem occurs constantly. The problem has not changed since onset.Pertinent negatives include no chest pain, no abdominal pain, no headaches and no shortness of breath. The symptoms are aggravated by walking. Nothing relieves the symptoms. He has tried rest for the symptoms. The treatment provided no relief.  ? ?  ? ?Home Medications ?Prior to Admission medications   ?Medication Sig Start Date End Date Taking? Authorizing Provider  ?aspirin EC 81 MG tablet Take 81 mg by mouth daily. Swallow whole.    [provider]  ?Budeson-Glycopyrrol-Formoterol (BREZTRI AEROSPHERE) 160-9-4.8 MCG/ACT AERO Inhale 2 puffs into the lungs in the morning and at bedtime. 08/26/21   Marshell Garfinkel, MD  ?busPIRone (BUSPAR) 10 MG tablet TAKE 1  TABLET BY MOUTH TWICE A DAY 01/12/22   [name], MD  ?dexamethasone (DECADRON) 1 MG tablet Take 2 tablets (2 mg total) by mouth daily with breakfast for 7 days, THEN 1 tablet (1 mg total) daily with breakfast for 7 days. 01/20/22 02/03/22  Ventura Sellers, MD  ?divalproex (DEPAKOTE) 250 MG DR tablet Take 1 tablet (250 mg total) by mouth 2 (two) times daily. 12/02/21   Ventura Sellers, MD  ?famotidine (PEPCID) 20 MG tablet Take 1 tablet (20 mg total) by mouth 2 (two) times daily. 05/09/21   [name], MD  ?lovastatin (MEVACOR) 20 MG tablet Take 1 tablet (20 mg total) by mouth at bedtime. 09/17/21   [name], MD  ?Multiple Vitamin (MULTIVITAMIN) capsule Take 1 capsule by mouth daily.    [provider]  ?ondansetron (ZOFRAN-ODT) 8 MG disintegrating tablet TAKE 1 TABLET BY MOUTH EVERY 8 HOURS AS NEEDED FOR NAUSEA AND VOMITING 01/25/22   Heilingoetter, Cassandra L, PA-C  ?pantoprazole (PROTONIX) 40 MG tablet Take 40 mg by mouth every morning. 06/08/21   [provider]  ?prochlorperazine (COMPAZINE) 10 MG tablet TAKE 1 TABLET BY MOUTH EVERY 6 HOURS AS NEEDED 12/15/21   Heilingoetter, Cassandra L, PA-C  ?sodium bicarbonate 650 MG tablet TAKE 1 TABLET BY MOUTH TWICE A DAY 12/03/21   Curt Bears, MD  ?traMADol (ULTRAM) 50 MG tablet TAKE 1/2 TABLET BY MOUTH TWICE A DAY AS NEEDED FOR PAIN 11/06/21  [name], MD  ?   ? ?Allergies    ?Bee venom, Elemental sulfur, Other, Sulfa antibiotics, and Namenda [memantine]   ? ?Review of Systems   ?Review of Systems  ?Constitutional:  Negative for fever.  ?HENT:  Negative for sore throat.   ?Eyes:  Negative for visual disturbance.  ?Respiratory:  Negative for shortness of breath.   ?Cardiovascular:  Negative for chest pain.  ?Gastrointestinal:  Negative for abdominal pain.  ?Genitourinary:  Negative for dysuria.  ?Musculoskeletal:  Positive for back pain, extremity weakness and gait problem.  ?Skin:  Negative for rash.  ?Neurological:  Positive for  weakness and numbness. Negative for headaches.  ? ?Physical Exam ?Updated Vital Signs ?BP 100/73 (BP Location: Right Arm)   Pulse (!) 107   Temp 98 ?F (36.7 ?C) (Oral)   Resp 18   Ht 6' (1.829 m)   Wt 59 kg   SpO2 100%   BMI 17.63 kg/m?  ?Physical Exam ?Vitals and nursing note reviewed.  ?Constitutional:   ?   General: He is not in acute distress. ?   Appearance: Normal appearance. He is well-developed.  ?HENT:  ?   Head: Normocephalic and atraumatic.  ?Eyes:  ?   Conjunctiva/sclera: Conjunctivae normal.  ?Cardiovascular:  ?   Rate and Rhythm: Normal rate and regular rhythm.  ?   Heart sounds: No murmur heard. ?Pulmonary:  ?   Effort: Pulmonary effort is normal. No respiratory distress.  ?   Breath sounds: Normal breath sounds.  ?Abdominal:  ?   Palpations: Abdomen is soft.  ?   Tenderness: There is no abdominal tenderness. There is no guarding or rebound.  ?Musculoskeletal:     ?   General: No swelling. Normal range of motion.  ?   Cervical back: Neck supple.  ?Skin: ?   General: Skin is warm and dry.  ?   Capillary Refill: Capillary refill takes less than 2 seconds.  ?Neurological:  ?   Mental Status: He is alert.  ?   Cranial Nerves: No cranial nerve deficit.  ?   Sensory: Sensory deficit present.  ?   Motor: Weakness present.  ?   Comments: He has decreased sensation of right lower leg.  There is also some generalized weakness of his right lower leg.  Other extremities normal strength and sensation.  ? ? ?ED Results / Procedures / Treatments   ?Labs ?(all labs ordered are listed, but only abnormal results are displayed) ?Labs Reviewed  ?COMPREHENSIVE METABOLIC PANEL - Abnormal; Notable for the following components:  ?    Result Value  ? Sodium 134 (*)   ? Glucose, Bld 103 (*)   ? BUN 31 (*)   ? All other components within normal limits  ?CBC WITH DIFFERENTIAL/PLATELET - Abnormal; Notable for the following components:  ? RBC 2.90 (*)   ? Hemoglobin 10.6 (*)   ? HCT 30.3 (*)   ? MCV 104.5 (*)   ? MCH 36.6  (*)   ? Platelets 146 (*)   ? Lymphs Abs 0.5 (*)   ? All other components within normal limits  ?URINALYSIS, ROUTINE W REFLEX MICROSCOPIC - Abnormal; Notable for the following components:  ? Ketones, ur 5 (*)   ? All other components within normal limits  ?VALPROIC ACID LEVEL - Abnormal; Notable for the following components:  ? Valproic Acid Lvl 44 (*)   ? All other components within normal limits  ?BASIC METABOLIC PANEL - Abnormal; Notable for the following components:  ?  BUN 24 (*)   ? All other components within normal limits  ?CBC - Abnormal; Notable for the following components:  ? WBC 3.9 (*)   ? RBC 2.79 (*)   ? Hemoglobin 10.1 (*)   ? HCT 29.3 (*)   ? MCV 105.0 (*)   ? MCH 36.2 (*)   ? Platelets 131 (*)   ? All other components within normal limits  ?RESP PANEL BY RT-PCR (FLU A&B, COVID) ARPGX2  ?CBC  ?RENAL FUNCTION PANEL  ?MAGNESIUM  ? ? ?EKG ?None ? ?Radiology ?DG Lumbar Spine Complete ? ?Result Date: 02/03/2022 ?CLINICAL DATA:  Falls.  Back pain. EXAM: LUMBAR SPINE - COMPLETE 4+ VIEW COMPARISON:  CT abdomen and pelvis 12/19/2021 FINDINGS: There are 5 non-rib-bearing lumbar-type vertebral bodies. Normal frontal alignment. No sagittal spondylolisthesis. Vertebral body heights are maintained. L5-S1 severe disc space narrowing, endplate sclerosis vacuum phenomenon and peripheral osteophytosis. The other disc spaces appear preserved. Vascular calcifications are noted. IMPRESSION: L5-S1 severe degenerative disc and endplate changes. Electronically Signed   By: Yvonne Kendall M.D.   On: 02/03/2022 19:38  ? ?CT Head Wo Contrast ? ?Result Date: 02/03/2022 ?CLINICAL DATA:  History of brain and small cell lung cancer, right leg weakness, multiple falls EXAM: CT HEAD WITHOUT CONTRAST TECHNIQUE: Contiguous axial images were obtained from the base of the skull through the vertex without intravenous contrast. RADIATION DOSE REDUCTION: This exam was performed according to the departmental dose-optimization program which  includes automated exposure control, adjustment of the mA and/or kV according to patient size and/or use of iterative reconstruction technique. COMPARISON:  12/18/2021 FINDINGS: Brain: No evidence of acute infarc

## 2022-02-03 NOTE — Assessment & Plan Note (Addendum)
Followed by Dr. Julien Nordmann for [facility] and Dr. Mickeal Skinner for brain mets.  MRI brain with interval decrease in brain mets.  CT chest/abdomen/pelvis with stable postradiation change and a sclerotic metastatic disease ?-Appreciate input by oncology and radiation oncology. ?

## 2022-02-03 NOTE — Assessment & Plan Note (Addendum)
Recent Labs  ?Lab 02/03/22 ?1905 02/04/22 ?6415 02/05/22 ?8309 02/06/22 ?0457  ?NA 134* 135 130* 132*  ?-Continue monitoring ? ?

## 2022-02-03 NOTE — Assessment & Plan Note (Addendum)
Decadron as above. ?

## 2022-02-04 ENCOUNTER — Inpatient Hospital Stay (HOSPITAL_COMMUNITY): Payer: 59

## 2022-02-04 ENCOUNTER — Ambulatory Visit: Payer: Self-pay | Admitting: Physical Therapy

## 2022-02-04 ENCOUNTER — Encounter: Payer: Self-pay | Admitting: Occupational Therapy

## 2022-02-04 DIAGNOSIS — Z8042 Family history of malignant neoplasm of prostate: Secondary | ICD-10-CM | POA: Diagnosis not present

## 2022-02-04 DIAGNOSIS — D61818 Other pancytopenia: Secondary | ICD-10-CM

## 2022-02-04 DIAGNOSIS — F1010 Alcohol abuse, uncomplicated: Secondary | ICD-10-CM | POA: Diagnosis present

## 2022-02-04 DIAGNOSIS — E785 Hyperlipidemia, unspecified: Secondary | ICD-10-CM | POA: Diagnosis present

## 2022-02-04 DIAGNOSIS — I10 Essential (primary) hypertension: Secondary | ICD-10-CM | POA: Diagnosis present

## 2022-02-04 DIAGNOSIS — Z681 Body mass index (BMI) 19 or less, adult: Secondary | ICD-10-CM | POA: Diagnosis not present

## 2022-02-04 DIAGNOSIS — W19XXXA Unspecified fall, initial encounter: Secondary | ICD-10-CM | POA: Diagnosis present

## 2022-02-04 DIAGNOSIS — E871 Hypo-osmolality and hyponatremia: Secondary | ICD-10-CM | POA: Diagnosis present

## 2022-02-04 DIAGNOSIS — C3412 Malignant neoplasm of upper lobe, left bronchus or lung: Secondary | ICD-10-CM | POA: Diagnosis present

## 2022-02-04 DIAGNOSIS — E43 Unspecified severe protein-calorie malnutrition: Secondary | ICD-10-CM | POA: Diagnosis present

## 2022-02-04 DIAGNOSIS — R296 Repeated falls: Secondary | ICD-10-CM | POA: Diagnosis not present

## 2022-02-04 DIAGNOSIS — S80212A Abrasion, left knee, initial encounter: Secondary | ICD-10-CM | POA: Diagnosis present

## 2022-02-04 DIAGNOSIS — C7931 Secondary malignant neoplasm of brain: Secondary | ICD-10-CM | POA: Diagnosis present

## 2022-02-04 DIAGNOSIS — S2241XA Multiple fractures of ribs, right side, initial encounter for closed fracture: Secondary | ICD-10-CM | POA: Diagnosis present

## 2022-02-04 DIAGNOSIS — Y92009 Unspecified place in unspecified non-institutional (private) residence as the place of occurrence of the external cause: Secondary | ICD-10-CM | POA: Diagnosis not present

## 2022-02-04 DIAGNOSIS — M47817 Spondylosis without myelopathy or radiculopathy, lumbosacral region: Secondary | ICD-10-CM | POA: Diagnosis present

## 2022-02-04 DIAGNOSIS — R5381 Other malaise: Secondary | ICD-10-CM | POA: Diagnosis not present

## 2022-02-04 DIAGNOSIS — C7951 Secondary malignant neoplasm of bone: Secondary | ICD-10-CM | POA: Diagnosis present

## 2022-02-04 DIAGNOSIS — I951 Orthostatic hypotension: Secondary | ICD-10-CM | POA: Diagnosis present

## 2022-02-04 DIAGNOSIS — R531 Weakness: Secondary | ICD-10-CM | POA: Diagnosis present

## 2022-02-04 DIAGNOSIS — C801 Malignant (primary) neoplasm, unspecified: Secondary | ICD-10-CM | POA: Diagnosis not present

## 2022-02-04 DIAGNOSIS — E222 Syndrome of inappropriate secretion of antidiuretic hormone: Secondary | ICD-10-CM | POA: Diagnosis present

## 2022-02-04 DIAGNOSIS — Z8249 Family history of ischemic heart disease and other diseases of the circulatory system: Secondary | ICD-10-CM | POA: Diagnosis not present

## 2022-02-04 DIAGNOSIS — J939 Pneumothorax, unspecified: Secondary | ICD-10-CM | POA: Diagnosis present

## 2022-02-04 DIAGNOSIS — Z9181 History of falling: Secondary | ICD-10-CM | POA: Diagnosis not present

## 2022-02-04 DIAGNOSIS — Z20822 Contact with and (suspected) exposure to covid-19: Secondary | ICD-10-CM | POA: Diagnosis present

## 2022-02-04 DIAGNOSIS — F17210 Nicotine dependence, cigarettes, uncomplicated: Secondary | ICD-10-CM | POA: Diagnosis present

## 2022-02-04 DIAGNOSIS — Z515 Encounter for palliative care: Secondary | ICD-10-CM | POA: Diagnosis not present

## 2022-02-04 DIAGNOSIS — Z7189 Other specified counseling: Secondary | ICD-10-CM | POA: Diagnosis not present

## 2022-02-04 DIAGNOSIS — Z8546 Personal history of malignant neoplasm of prostate: Secondary | ICD-10-CM | POA: Diagnosis not present

## 2022-02-04 DIAGNOSIS — M5137 Other intervertebral disc degeneration, lumbosacral region: Secondary | ICD-10-CM | POA: Diagnosis present

## 2022-02-04 LAB — BASIC METABOLIC PANEL
Anion gap: 8 (ref 5–15)
BUN: 24 mg/dL — ABNORMAL HIGH (ref 6–20)
CO2: 27 mmol/L (ref 22–32)
Calcium: 9 mg/dL (ref 8.9–10.3)
Chloride: 100 mmol/L (ref 98–111)
Creatinine, Ser: 0.85 mg/dL (ref 0.61–1.24)
GFR, Estimated: >60 mL/min (ref >60)
Glucose, Bld: 88 mg/dL (ref 70–99)
Potassium: 3.9 mmol/L (ref 3.5–5.1)
Sodium: 135 mmol/L (ref 135–145)

## 2022-02-04 LAB — CBC
HCT: 29.3 % — ABNORMAL LOW (ref 39.0–52.0)
Hemoglobin: 10.1 g/dL — ABNORMAL LOW (ref 13.0–17.0)
MCH: 36.2 pg — ABNORMAL HIGH (ref 26.0–34.0)
MCHC: 34.5 g/dL (ref 30.0–36.0)
MCV: 105 fL — ABNORMAL HIGH (ref 80.0–100.0)
Platelets: 131 10*3/uL — ABNORMAL LOW (ref 150–400)
RBC: 2.79 MIL/uL — ABNORMAL LOW (ref 4.22–5.81)
RDW: 13.3 % (ref 11.5–15.5)
WBC: 3.9 10*3/uL — ABNORMAL LOW (ref 4.0–10.5)
nRBC: 0 % (ref 0.0–0.2)

## 2022-02-04 MED ORDER — GADOBUTROL 1 MMOL/ML IV SOLN
6.0000 mL | Freq: Once | INTRAVENOUS | Status: AC | PRN
  Administered 2022-02-04: 6 mL via INTRAVENOUS

## 2022-02-04 MED ORDER — CHLORHEXIDINE GLUCONATE CLOTH 2 % EX PADS
6.0000 | MEDICATED_PAD | Freq: Every day | CUTANEOUS | Status: DC
  Administered 2022-02-04 – 2022-02-06 (×3): 6 via TOPICAL

## 2022-02-04 MED ORDER — IOHEXOL 300 MG/ML  SOLN
100.0000 mL | Freq: Once | INTRAMUSCULAR | Status: AC | PRN
  Administered 2022-02-04: 100 mL via INTRAVENOUS

## 2022-02-04 MED ORDER — IOHEXOL 9 MG/ML PO SOLN
ORAL | Status: AC
  Administered 2022-02-04: 500 mL
  Filled 2022-02-04: qty 1000

## 2022-02-04 NOTE — Progress Notes (Signed)
Inpatient Rehab Admissions Coordinator:  ? ?Per therapy recommendations,  patient was screened for CIR candidacy by Shantina Chronister, MS, CCC-SLP. At this time, Pt. Appears to be a a potential candidate for CIR. I will place   order for rehab consult per protocol for full assessment. Please contact me any with questions. ? ?Alieah Brinton, MS, CCC-SLP ?Rehab Admissions Coordinator  ?[phone] (celll) ?[phone] (office) ? ?

## 2022-02-04 NOTE — Assessment & Plan Note (Addendum)
Relatively stable.  There could be some element of hemodilution. ?-Recheck CBC in about 1 week ?

## 2022-02-04 NOTE — Hospital Course (Addendum)
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 Julien Nordmann and Mickeal Skinner, DDD, EtOH abuse, HTN and tobacco use disorder presenting with lower extremity weakness, right> left and recurrent falls, and admitted for the same.  He was on steroid taper for his brain mets and went from 2 mg a day to 1 mg a day a week prior to presentation.  He had about 4 episodes of fall the day of presentation but no LOC or significant traumatic injury.  Orthostatic vitals positive. ? ?CT head without contrast with no evidence of acute infarct or hemorrhage.  He was started on IV Decadron 2 mg twice daily.  Neurooncology recommended MRI brain which showed significant interval decrease in the size of previously noted enhancing lesions in the brain.  Evaluated by oncology and had CT chest/abdomen/pelvis that showed new 5 to 10% left-sided pneumothorax and new acute/subacute right anterior ninth and 10th ribs fracture and spondylosis and DDD at L5-S1 level contributing to right foraminal impingement.   ? ?Two view CXR on 3/16 with no evidence of pneumothorax.  Patient remains stable from respiratory standpoint. ? ?Therapy recommended CIR.  ?

## 2022-02-04 NOTE — Evaluation (Signed)
Physical Therapy Evaluation ?Richard Details ?Name: Richard Santos ?MRN: [id number] ?DOB: November 10, 1961 ?Today's Date: 02/04/2022 ? ?History of Present Illness ? 61 yo male presents [facility] on 3[facility]14 with progressive weakness over the past several weeks, falls. 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 Julien Nordmann and Mickeal Skinner, DDD, EtOH abuse, HTN and tobacco use disorder.  ?Clinical Impression ?  ?Pt presents with weakness R>L progressive over the past few weeks, incoordination UE and LE L>R, ataxic gait in both extremities and trunk, impaired sitting and standing balance with recent history of multiple falls, and decreased activity tolerance vs baseline. Pt to benefit from acute PT to address deficits. Pt ambulated short hall distance x2 with mod physical assist to correct buckling and significant incoordination; requires MAX verbal cuing for form and safety during all mobility. Over the course of the past few weeks, pt reports feeling progressively weaker and more unsteady, would be a great AIR candidate to maximize function and return safely home to wife afterwards. PT to progress mobility as tolerated, and will continue to follow acutely.  ?   ?   ? ?Recommendations for follow up therapy are one component of a multi-disciplinary discharge planning process, led by the attending physician.  Recommendations may be updated based on Richard status, additional functional criteria and insurance authorization. ? ?Follow Up Recommendations Acute inpatient rehab (3hours/day) ? ?  ?Assistance Recommended at Discharge Frequent or constant Supervision/Assistance  ?Richard can return home with the following ? A lot of help with walking and/or transfers;A little help with bathing/dressing/bathroom;Direct supervision/assist for medications management;Assistance with cooking/housework;Assist for transportation;Help with stairs or ramp for entrance ? ?  ?Equipment Recommendations Rolling walker (2  wheels)  ?Recommendations for Other Services ?    ?  ?Functional Status Assessment Richard has had a recent decline in their functional status and demonstrates the ability to make significant improvements in function in a reasonable and predictable amount of time.  ? ?  ?Precautions / Restrictions Precautions ?Precautions: Fall ?Precaution Comments: truncal and limb ataxia ?Restrictions ?Weight Bearing Restrictions: No  ? ?  ? ?Mobility ? Bed Mobility ?Overal bed mobility: Needs Assistance ?Bed Mobility: Rolling, Sidelying to Sit ?Rolling: Min assist ?Sidelying to sit: Min assist, HOB elevated ?  ?  ?  ?General bed mobility comments: assist for LE lowering over EOB, trunk elevation, scooting to EOB. Step-by-step cuing for sequencing ?  ? ?Transfers ?Overall transfer level: Needs assistance ?Equipment used: Rolling walker (2 wheels) ?Transfers: Sit to/from Stand ?Sit to Stand: Min assist ?  ?  ?  ?  ?  ?General transfer comment: assist for power up, rise, steadying; cues for sequencing, hand placement when rising and sitting sometimes not followed pending processing time. ?  ? ?Ambulation/Gait ?Ambulation/Gait assistance: Mod assist ?Gait Distance (Feet): 30 Feet (seated rest x2 minutes, 30 ft back to room) ?Assistive device: Rolling walker (2 wheels) ?Gait Pattern/deviations: Step-through pattern, Decreased stride length, Ataxic, Shuffle, Drifts right/left, Trunk flexed, Narrow base of support, Knees buckling ?Gait velocity: decr ?  ?  ?General Gait Details: mod assist for steadying, correcting buckling R>L x2, maintaining straight trajectory; max cuing for "big steps", upright posture, placement in RW. Worsening ataxia and narrowing BOS with fatigue and further distance ? ?Stairs ?  ?  ?  ?  ?  ? ?Wheelchair Mobility ?  ? ?Modified Rankin (Stroke Patients Only) ?  ? ?  ? ?Balance Overall balance assessment: Needs assistance, History of Falls (  history of multiple falls over the course of the past several weeks, x4  falls day before admission) ?Sitting-balance support: No upper extremity supported, Feet supported ?Sitting balance-Leahy Scale: Fair ?Sitting balance - Comments: able to sit EOB unsupported, requires UE support dynamically ?Postural control: Posterior lean, Right lateral lean ?Standing balance support: Bilateral upper extremity supported, During functional activity ?Standing balance-Leahy Scale: Poor ?  ?  ?  ?  ?  ?  ?  ?  ?  ?  ?  ?  ?   ? ? ? ?Pertinent Vitals/Pain Pain Assessment ?Pain Assessment: Faces ?Faces Pain Scale: Hurts a little bit ?Pain Location: LEs ?Pain Descriptors / Indicators: Tingling ?Pain Intervention(s): Limited activity within Richard's tolerance, Monitored during session, Repositioned  ? ? ?Home Living Family/Richard expects to be discharged to:: Private residence ?Living Arrangements: Spouse/significant other ?Available Help at Discharge: Family;Available 24 hours/day ?Type of Home: House ?Home Access: Stairs to enter ?Entrance Stairs-Rails: Right ?Entrance Stairs-Number of Steps: 3 ?Alternate Level Stairs-Number of Steps: 12 ?Home Layout: Two level;Able to live on main level with bedroom/bathroom ?Home Equipment: Rollator (4 wheels);Cane - quad;BSC/3in1 ?   ?  ?Prior Function Prior Level of Function : Independent/Modified Independent ?  ?  ?  ?  ?  ?  ?Mobility Comments: Richard reported using rollator at home. Richard reported that rollator is quick to get away from him ?ADLs Comments: pt states he sometimes get help with dressing, bathing from his spouse depending on how he is feeling ?  ? ? ?Hand Dominance  ? Dominant Hand: Left ? ?  ?Extremity/Trunk Assessment  ? Upper Extremity Assessment ?Upper Extremity Assessment: Defer to OT evaluation ?  ? ?Lower Extremity Assessment ?Lower Extremity Assessment: Generalized weakness;RLE deficits/detail;LLE deficits/detail ?RLE Deficits / Details: 3+/5 knee extensors, 3+/5 knee flexors, 3/5 hip flexion, 4/5 hip abd/add, at least 3/5 DF/PF ?RLE  Sensation: decreased light touch ("tingling", since treatments) ?RLE Coordination: decreased gross motor;decreased fine motor ?LLE Deficits / Details: 4/5 knee extensors, 4/5 knee flexors, 3/5 hip flexion, 4/5 hip abd/add, at least 3/5 DF/PF ?LLE Coordination: decreased fine motor;decreased gross motor (impaired coordination as assessed via heel to shin test, overshooting and undershooting finger-to-nose) ?  ? ?Cervical / Trunk Assessment ?Cervical / Trunk Assessment: Kyphotic  ?Communication  ? Communication: Other (comment) (slowed speed, occasional slurring)  ?Cognition Arousal/Alertness: Awake/alert ?Behavior During Therapy: [facility] for tasks assessed/performed ?Overall Cognitive Status: Impaired/Different from baseline ?Area of Impairment: Problem solving, Attention, Following commands, Safety/judgement ?  ?  ?  ?  ?  ?  ?  ?  ?  ?Current Attention Level: Sustained ?  ?Following Commands: Follows one step commands with increased time, Follows multi-step commands inconsistently ?Safety/Judgement: Decreased awareness of deficits, Decreased awareness of safety ?  ?Problem Solving: Slow processing, Decreased initiation, Requires verbal cues, Requires tactile cues ?General Comments: Pt A&Ox4, very increased processing time to follow one-step commands, at times requires repeated cues for following examination items ?  ?  ? ?  ?General Comments General comments (skin integrity, edema, etc.): abrasions along bilat LEs suspect secondary to falls, tremors UEs ? ?  ?Exercises    ? ?Assessment/Plan  ?  ?PT Assessment Richard needs continued PT services  ?PT Problem List Decreased strength;Decreased mobility;Decreased coordination;Decreased activity tolerance;Decreased balance;Decreased knowledge of use of DME;Pain;Impaired sensation;Decreased cognition;Decreased skin integrity;Decreased safety awareness ? ?   ?  ?PT Treatment Interventions Therapeutic activities;DME instruction;Gait training;Therapeutic exercise;Richard/family  education;Balance training;Stair training;Functional mobility training;Neuromuscular re-education   ? ?PT Goals (Current goals can be  found in the Care Plan section)  ?Acute Rehab PT Goals ?Richard Santos

## 2022-02-04 NOTE — Assessment & Plan Note (Signed)
Body mass index is 17.63 kg/m?Marland Kitchen  Has significant muscle mass and subcu fat loss. ?Consult dietitian ?

## 2022-02-04 NOTE — Assessment & Plan Note (Addendum)
Could be due to generalized weakness, alcohol or adrenal insufficiency from chronic steroid use ?-See generalized weakness above ?

## 2022-02-04 NOTE — Progress Notes (Signed)
DIAGNOSIS: Extensive stage (T2b, N2, M1c) small cell lung cancer presented with left upper lobe pulmonary nodule in addition to left hilar mass with mediastinal invasion and solitary brain metastasis diagnosed in May 2021 ?  ?PRIOR THERAPY:  ?1) Palliative radiation to the left hilar and mediastinal lymphadenopathy under the care of Dr. Lisbeth Renshaw. ?2) Cranial irradiation from 08/26/20-09/06/20 under the care of Dr. Lisbeth Renshaw ?3) Palliative systemic chemotherapy with carboplatin for AUC of 5 on day 1, etoposide 100 mg/M2 on days 1, 2 and 3 as well as Imfinzi 1500 mg IV every 3 weeks with the chemotherapy.  First dose of chemotherapy May 06, 2020.  The patient will receive treatment with Cosela on the days of his chemotherapy.  Status post 14 cycles.  Starting from cycle #5 he started maintenance immunotherapy with Imfinzi 1500 mg IV every 4 weeks. Discontinued due to disease progression. Last dose 05/29/21.  ?4) Carboplatin for an AUC of 5 and etoposide 100 mg/M2 on days 1, 2, and 3 IV every 3 weeks Neulasta or Cosela.  First dose expected on 06/24/2021.  Status post 6 cycles. ?5) whole brain irradiation under the care of Dr. Lisbeth Renshaw ?  ?CURRENT THERAPY: Observation. ? ?Subjective: ?The patient is seen and examined today.  He is a very pleasant 61 years old white male with extensive stage small cell lung cancer that was diagnosed in May 2021 status post several chemotherapy regimens including carboplatin, etoposide and Imfinzi followed by another course of systemic chemotherapy with carboplatin and etoposide.  The patient was found recently to have multiple metastatic brain lesions and he underwent whole brain irradiation under the care of Dr. Lisbeth Renshaw.  His last imaging study several months ago showed no evidence for disease progression in the chest, abdomen and pelvis.  The patient has been on a tapered dose of Decadron down to 1 mg p.o. daily but recently started having significant weakness especially in the right leg with  unsteady gait and falls.  He had 4 falls recently.  He presented to the emergency department for evaluation.  His dose of Decadron was increased and the patient started on IV hydration and he feeling a little bit better.  He denied having any current chest pain, shortness of breath, cough or hemoptysis.  He has no nausea, vomiting, diarrhea or constipation.  He still have the weakness in the lower extremities but little bit better he has no fever or chills. ? ?Objective: ?Vital signs in last 24 hours: ?Temp:  [97.6 ?F (36.4 ?C)-98 ?F (36.7 ?C)] 97.6 ?F (36.4 ?C) (03/15 1332) ?Pulse Rate:  [67-107] 67 (03/15 1332) ?Resp:  [15-19] 16 (03/15 1332) ?BP: (100-131)/(73-98) 119/87 (03/15 1332) ?SpO2:  [98 %-100 %] 100 % (03/15 1332) ?Weight:  [130 lb (59 kg)] 130 lb (59 kg) (03/14 1817) ? ?Intake/Output from previous day: ?03/14 0701 - 03/15 0700 ?In: 181.9 [I.V.:181.9] ?Out: 475 [Urine:475] ?Intake/Output this shift: ?Total I/O ?In: 480 [P.O.:480] ?Out: 350 [Urine:350] ? ?General appearance: alert, cooperative, fatigued, and no distress ?Resp: clear to auscultation bilaterally ?Cardio: regular rate and rhythm, S1, S2 normal, no murmur, click, rub or gallop ?GI: soft, non-tender; bowel sounds normal; no masses,  no organomegaly ?Extremities: extremities normal, atraumatic, no cyanosis or edema ? ?Lab Results:  ?Recent Labs  ?  02/03/22 ?1905 02/04/22 ?2549  ?WBC 5.1 3.9*  ?HGB 10.6* 10.1*  ?HCT 30.3* 29.3*  ?PLT 146* 131*  ? ?BMET ?Recent Labs  ?  02/03/22 ?1905 02/04/22 ?8264  ?NA 134* 135  ?K 3.9 3.9  ?  CL 98 100  ?CO2 26 27  ?GLUCOSE 103* 88  ?BUN 31* 24*  ?CREATININE 1.23 0.85  ?CALCIUM 9.1 9.0  ? ? ?Studies/Results: ?DG Lumbar Spine Complete ? ?Result Date: 02/03/2022 ?CLINICAL DATA:  Falls.  Back pain. EXAM: LUMBAR SPINE - COMPLETE 4+ VIEW COMPARISON:  CT abdomen and pelvis 12/19/2021 FINDINGS: There are 5 non-rib-bearing lumbar-type vertebral bodies. Normal frontal alignment. No sagittal spondylolisthesis. Vertebral  body heights are maintained. L5-S1 severe disc space narrowing, endplate sclerosis vacuum phenomenon and peripheral osteophytosis. The other disc spaces appear preserved. Vascular calcifications are noted. IMPRESSION: L5-S1 severe degenerative disc and endplate changes. Electronically Signed   By: Yvonne Kendall M.D.   On: 02/03/2022 19:38  ? ?CT Head Wo Contrast ? ?Result Date: 02/03/2022 ?CLINICAL DATA:  History of brain and small cell lung cancer, right leg weakness, multiple falls EXAM: CT HEAD WITHOUT CONTRAST TECHNIQUE: Contiguous axial images were obtained from the base of the skull through the vertex without intravenous contrast. RADIATION DOSE REDUCTION: This exam was performed according to the departmental dose-optimization program which includes automated exposure control, adjustment of the mA and/or kV according to patient size and/or use of iterative reconstruction technique. COMPARISON:  12/18/2021 FINDINGS: Brain: No evidence of acute infarct or hemorrhage. The innumerable brain metastases seen on prior MRI are not well visualized on unenhanced CT. Hypodensities throughout the periventricular and subcortical white matter correspond with the areas of edema seen on prior MRI. Lateral ventricles and midline structures are otherwise unremarkable. No acute extra-axial fluid collections. No mass effect. Vascular: No hyperdense vessel or unexpected calcification. Skull: Normal. Negative for fracture or focal lesion. Sinuses/Orbits: Chronic bilateral mastoid effusions. The paranasal sinuses are otherwise clear. Other: None. IMPRESSION: 1. No evidence of acute infarct or hemorrhage. 2. The innumerable brain metastases seen on prior MRI are not well visualized on unenhanced CT. Hypodensities in the periventricular and subcortical white matter consistent with edema on prior MRI, stable. Electronically Signed   By: Randa Ngo M.D.   On: 02/03/2022 19:52   ? ?Medications: I have reviewed the patient's current  medications. ? ? ?Assessment/Plan: ?This is a very pleasant 61 years old white male with extensive stage small cell lung cancer diagnosed initially in May 2021 status post several chemotherapy regimens.  His systemic disease was under control but the patient continues to have recurrent and progressive brain metastasis.  He completed a course of whole brain irradiation recently. ?He presented with increasing fatigue and weakness after tapering his Decadron to 1 mg p.o. daily. ?I had a lengthy discussion with the patient and his wife today about his condition and treatment options. ?His dose of Decadron was increased to 2 mg p.o. twice daily and he is feeling a little bit better. ?I recommended for the patient to stay on the current dose. ?For the seizure prophylaxis, he is currently on Depakote. ?He was supposed to have repeat imaging studies in less than 2 weeks.  I recommended for the patient to have repeat CT scan of the chest, abdomen and pelvis during his hospitalization to rule out any systemic disease progression.  If he has systemic disease progression I would discuss with him other systemic treatment options including chemotherapy with Zepzelca (lurbinectedin) versus palliative care and hospice consideration. ?The patient and his wife are in agreement with the current plan.  ?Thank you for taking good care of Mr. Devaul, I will continue to follow-up the patient with you and assist in his management on as-needed basis. ? LOS: 0 days  ? ? ?  Eilleen Kempf ?02/04/2022 ? ?  ?

## 2022-02-04 NOTE — Progress Notes (Signed)
PROGRESS NOTE  Richard Santos:[id number] DOB: [birth year]   PCP: Si Gaul, MD  Patient is from: Home.  Lives with his wife.  Uses walker at baseline.    DOA: 02/03/2022 LOS: 0  Chief complaints Chief Complaint  Patient presents with   Weakness   Fall   Urinary Retention     Brief Narrative / Interim history: 61 year old M with PMH of SCLC with mets to brain followed by Dr. Arbutus Ped and Barbaraann Cao, DDD, EtOH abuse, HTN and tobacco use disorder presenting with lower extremity weakness, right> left and recurrent falls, and admitted for the same.  He was on steroid taper for his brain mets and went from 2 mg a day to 1 mg a day a week prior to presentation.  He had about 4 episodes of fall the day of presentation but no LOC or significant traumatic injury.    CT head without contrast with no evidence of acute infarct or hemorrhage.  He was started on IV Decadron 2 mg twice daily.     Subjective: Seen and examined earlier this morning.  No major events overnight of this morning.  No complaints.  He denies pain, shortness of breath, lightheadedness, GI or UTI symptoms.  Denies focal neuro symptoms.  He is oriented x4.   Objective: Vitals:   02/03/22 2300 02/03/22 2322 02/04/22 0319 02/04/22 1332  BP: 113/82 123/81 113/82 119/87  Pulse: 75 75 69 67  Resp: 15 17 17 16   Temp:  97.9 F (36.6 C) 97.8 F (36.6 C) 97.6 F (36.4 C)  TempSrc:  Oral Oral Oral  SpO2: 98% 100% 98% 100%  Weight:      Height:        Examination:  GENERAL: No apparent distress.  Nontoxic. HEENT: MMM.  Vision and hearing grossly intact.  NECK: Supple.  No apparent JVD.  RESP:  No IWOB.  Fair aeration bilaterally. CVS:  RRR. Heart sounds normal.  ABD/GI/GU: BS+. Abd soft, NTND.  MSK/EXT:  Moves extremities. No apparent deformity. No edema.  SKIN: Small skin bruise over right knee. NEURO: Awake, alert and oriented appropriately.  No apparent focal neuro deficit. PSYCH: Calm. Normal affect.    Procedures:  None  Microbiology summarized: COVID-19 and influenza PCR nonreactive.  Assessment and Plan: * Generalized weakness Reportedly, weakness worsened when decadron was reduced to 1 mg a day a week ago.  Some concern about RLE weakness on presentation although his exam looks symmetric today.  No LOC to suspect syncope.  Unlikely seizure.  CT head without contrast without acute finding.  Patient has SCLC with brain mets.  -Dr. Barbaraann Cao who is in agreement with IV Decadron 2 mg twice daily and repeat MRI brain -Continue home Depakote -Stop tramadol on discharge-for lower his seizure threshold. -Continue PT/OT-recommended SNF. -Fall precaution  Recurrent falls Could be due to generalized weakness, alcohol or adrenal insufficiency from chronic steroid use -See generalized weakness above  Metastatic cancer to brain (HCC) Decadron and MRI brain as above.  Small cell lung cancer, left upper lobe (HCC) Followed by Dr. Arbutus Ped for [facility] and Dr. Barbaraann Cao for brain mets -Repeat MRI brain as above  Pancytopenia (HCC) Relatively stable.  There could be some element of hemodilution. -Continue monitoring  Alcohol use Continues to drink 3 beers a day. Used to drink more.  -Continue CIWA protocol.  Hyponatremia Resolved.  Tobacco abuse Continues to smoke a few cigarettes a day Encourage cessation Nicotine patch if needed  HTN (hypertension) Does not seem to be  on medication for this. -Continue monitoring  Protein-calorie malnutrition, severe Body mass index is 17.63 kg/m.  Has significant muscle mass and subcu fat loss. Consult dietitian     DVT prophylaxis:  enoxaparin (LOVENOX) injection 40 mg Start: 02/04/22 1000  Code Status: Full code Family Communication: Attempted to call patient's wife for update but no answer. Level of care: Med-Surg Status is: Observation The patient will require care spanning > 2 midnights and should be moved to inpatient because:  Generalized weakness, recurrent fall and brain mets requiring IV Decadron and safe disposition/SNF   Final disposition: SNF  Consultants:  Neurooncology  Sch Meds:  Scheduled Meds:  aspirin EC  81 mg Oral Daily   Chlorhexidine Gluconate Cloth  6 each Topical Daily   dexamethasone (DECADRON) injection  2 mg Intravenous Q12H   divalproex  250 mg Oral BID   enoxaparin (LOVENOX) injection  40 mg Subcutaneous Q24H   folic acid  1 mg Oral Daily   umeclidinium bromide  1 puff Inhalation Daily   And   mometasone-formoterol  2 puff Inhalation BID   multivitamin with minerals  1 tablet Oral Daily   thiamine  100 mg Oral Daily   Or   thiamine  100 mg Intravenous Daily   Continuous Infusions: PRN Meds:.acetaminophen **OR** acetaminophen, ipratropium-albuterol, LORazepam **OR** LORazepam, ondansetron **OR** ondansetron (ZOFRAN) IV, senna-docusate  Antimicrobials: Anti-infectives (From admission, onward)    None        I have personally reviewed the following labs and images: CBC: Recent Labs  Lab 02/03/22 1905 02/04/22 0536  WBC 5.1 3.9*  NEUTROABS 4.0  --   HGB 10.6* 10.1*  HCT 30.3* 29.3*  MCV 104.5* 105.0*  PLT 146* 131*   BMP &GFR Recent Labs  Lab 02/03/22 1905 02/04/22 0536  NA 134* 135  K 3.9 3.9  CL 98 100  CO2 26 27  GLUCOSE 103* 88  BUN 31* 24*  CREATININE 1.23 0.85  CALCIUM 9.1 9.0   Estimated Creatinine Clearance: 77.1 mL/min (by C-G formula based on SCr of 0.85 mg/dL). Liver & Pancreas: Recent Labs  Lab 02/03/22 1905  AST 27  ALT 28  ALKPHOS 40  BILITOT 0.5  PROT 6.7  ALBUMIN 3.7   No results for input(s): LIPASE, AMYLASE in the last 168 hours. No results for input(s): AMMONIA in the last 168 hours. Diabetic: No results for input(s): HGBA1C in the last 72 hours. No results for input(s): GLUCAP in the last 168 hours. Cardiac Enzymes: No results for input(s): CKTOTAL, CKMB, CKMBINDEX, TROPONINI in the last 168 hours. No results for  input(s): PROBNP in the last 8760 hours. Coagulation Profile: No results for input(s): INR, PROTIME in the last 168 hours. Thyroid Function Tests: No results for input(s): TSH, T4TOTAL, FREET4, T3FREE, THYROIDAB in the last 72 hours. Lipid Profile: No results for input(s): CHOL, HDL, LDLCALC, TRIG, CHOLHDL, LDLDIRECT in the last 72 hours. Anemia Panel: No results for input(s): VITAMINB12, FOLATE, FERRITIN, TIBC, IRON, RETICCTPCT in the last 72 hours. Urine analysis:    Component Value Date/Time   COLORURINE YELLOW 02/03/2022 2022   APPEARANCEUR CLEAR 02/03/2022 2022   LABSPEC 1.019 02/03/2022 2022   PHURINE 6.0 02/03/2022 2022   GLUCOSEU NEGATIVE 02/03/2022 2022   GLUCOSEU NEGATIVE 12/12/2019 1506   HGBUR NEGATIVE 02/03/2022 2022   BILIRUBINUR NEGATIVE 02/03/2022 2022   KETONESUR 5 (A) 02/03/2022 2022   PROTEINUR NEGATIVE 02/03/2022 2022   UROBILINOGEN 0.2 12/12/2019 1506   NITRITE NEGATIVE 02/03/2022 2022   LEUKOCYTESUR NEGATIVE  02/03/2022 2022   Sepsis Labs: Invalid input(s): PROCALCITONIN, LACTICIDVEN  Microbiology: Recent Results (from the past 240 hour(s))  Resp Panel by RT-PCR (Flu A&B, Covid) Urine, Clean Catch     Status: None   Collection Time: 02/03/22  8:22 PM   Specimen: Urine, Clean Catch; Nasopharyngeal(NP) swabs in vial transport medium  Result Value Ref Range Status   SARS Coronavirus 2 by RT PCR NEGATIVE NEGATIVE Final    Comment: (NOTE) SARS-CoV-2 target nucleic acids are NOT DETECTED.  The SARS-CoV-2 RNA is generally detectable in upper respiratory specimens during the acute phase of infection. The lowest concentration of SARS-CoV-2 viral copies this assay can detect is 138 copies/mL. A negative result does not preclude SARS-Cov-2 infection and should not be used as the sole basis for treatment or other patient management decisions. A negative result may occur with  improper specimen collection/handling, submission of specimen other than nasopharyngeal  swab, presence of viral mutation(s) within the areas targeted by this assay, and inadequate number of viral copies(<138 copies/mL). A negative result must be combined with clinical observations, patient history, and epidemiological information. The expected result is Negative.  Fact Sheet for Patients:  BloggerCourse.com  Fact Sheet for Healthcare Providers:  SeriousBroker.it  This test is no t yet approved or cleared by the [place] FDA and  has been authorized for detection and/or diagnosis of SARS-CoV-2 by FDA under an Emergency Use Authorization (EUA). This EUA will remain  in effect (meaning this test can be used) for the duration of the COVID-19 declaration under Section 564(b)(1) of the Act, 21 U.S.C.section 360bbb-3(b)(1), unless the authorization is terminated  or revoked sooner.       Influenza A by PCR NEGATIVE NEGATIVE Final   Influenza B by PCR NEGATIVE NEGATIVE Final    Comment: (NOTE) The Xpert Xpress SARS-CoV-2/FLU/RSV plus assay is intended as an aid in the diagnosis of influenza from Nasopharyngeal swab specimens and should not be used as a sole basis for treatment. Nasal washings and aspirates are unacceptable for Xpert Xpress SARS-CoV-2/FLU/RSV testing.  Fact Sheet for Patients: BloggerCourse.com  Fact Sheet for Healthcare Providers: SeriousBroker.it  This test is not yet approved or cleared by the [place] FDA and has been authorized for detection and/or diagnosis of SARS-CoV-2 by FDA under an Emergency Use Authorization (EUA). This EUA will remain in effect (meaning this test can be used) for the duration of the COVID-19 declaration under Section 564(b)(1) of the Act, 21 U.S.C. section 360bbb-3(b)(1), unless the authorization is terminated or revoked.  Performed at [facility], [address] [place], [place] [address]      Radiology Studies: DG Lumbar Spine Complete  Result Date: 02/03/2022 CLINICAL DATA:  Falls.  Back pain. EXAM: LUMBAR SPINE - COMPLETE 4+ VIEW COMPARISON:  CT abdomen and pelvis 12/19/2021 FINDINGS: There are 5 non-rib-bearing lumbar-type vertebral bodies. Normal frontal alignment. No sagittal spondylolisthesis. Vertebral body heights are maintained. L5-S1 severe disc space narrowing, endplate sclerosis vacuum phenomenon and peripheral osteophytosis. The other disc spaces appear preserved. Vascular calcifications are noted. IMPRESSION: L5-S1 severe degenerative disc and endplate changes. Electronically Signed   By: Neita Garnet M.D.   On: 02/03/2022 19:38   CT Head Wo Contrast  Result Date: 02/03/2022 CLINICAL DATA:  History of brain and small cell lung cancer, right leg weakness, multiple falls EXAM: CT HEAD WITHOUT CONTRAST TECHNIQUE: Contiguous axial images were obtained from the base of the skull through the vertex without intravenous contrast. RADIATION DOSE REDUCTION: This exam was  performed according to the departmental dose-optimization program which includes automated exposure control, adjustment of the mA and/or kV according to patient size and/or use of iterative reconstruction technique. COMPARISON:  12/18/2021 FINDINGS: Brain: No evidence of acute infarct or hemorrhage. The innumerable brain metastases seen on prior MRI are not well visualized on unenhanced CT. Hypodensities throughout the periventricular and subcortical white matter correspond with the areas of edema seen on prior MRI. Lateral ventricles and midline structures are otherwise unremarkable. No acute extra-axial fluid collections. No mass effect. Vascular: No hyperdense vessel or unexpected calcification. Skull: Normal. Negative for fracture or focal lesion. Sinuses/Orbits: Chronic bilateral mastoid effusions. The paranasal sinuses are otherwise clear. Other: None. IMPRESSION: 1. No evidence of acute infarct or hemorrhage. 2.  The innumerable brain metastases seen on prior MRI are not well visualized on unenhanced CT. Hypodensities in the periventricular and subcortical white matter consistent with edema on prior MRI, stable. Electronically Signed   By: Sharlet Salina M.D.   On: 02/03/2022 19:52      Naiah Donahoe T. Jennaya Pogue [facility]  If 7PM-7AM, please contact night-coverage [email or web address] 02/04/2022, 1:40 PM

## 2022-02-04 NOTE — Evaluation (Addendum)
Occupational Therapy Evaluation ?Patient Details ?Name: Richard Santos ?MRN: [id number] ?DOB: Dec 16, 1960 ?Today's Date: 02/04/2022 ? ? ?History of Present Illness Patient is a 61 year old male who presented after multiple falls at home with weakness in RLE. patient was found to have weakness. 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 brain mets.  ? ?Clinical Impression ?  ?Patient is a 61 year old male who was noted to have had a functional decline with increased falls prior to admission. Patient was living at home with wife at rollator level prior level. Currently, patient is noted to have wobbly BLE with standing with reliance on RW to maintain balance for transfers from 3in1 and recliner. Patient was noted to have decreased strength, decreased coordination/dysarthric movements of BUE,decreased standing balance/tolerance, decreased endurance, decreased functional activity tolerance, decreased safety awareness impacting participation in ADLs. Patient would continue to benefit from skilled OT services at this time while admitted and after d/c to address noted deficits in order to improve overall safety and independence in ADLs.  ? ?   ? ?Recommendations for follow up therapy are one component of a multi-disciplinary discharge planning process, led by the attending physician.  Recommendations may be updated based on patient status, additional functional criteria and insurance authorization.  ? ?Follow Up Recommendations ? Skilled nursing-short term rehab (<3 hours/day)  ?  ?Assistance Recommended at Discharge Frequent or constant Supervision/Assistance  ?Patient can return home with the following A little help with walking and/or transfers;A little help with bathing/dressing/bathroom;Assistance with cooking/housework;Direct supervision/assist for financial management;Assist for transportation;Direct supervision/assist for medications  management;Help with stairs or ramp for entrance ? ?  ?Functional Status Assessment ? Patient has had a recent decline in their functional status and demonstrates the ability to make significant improvements in function in a reasonable and predictable amount of time.  ?Equipment Recommendations ? Other (comment) (RW)  ?  ?Recommendations for Other Services   ? ? ?  ?Precautions / Restrictions Precautions ?Precautions: Fall ?Restrictions ?Weight Bearing Restrictions: No  ? ?  ? ?Mobility Bed Mobility ?  ?  ?  ?  ?  ?  ?  ?  ?  ? ?Transfers ?  ?  ?  ?  ?  ?  ?  ?  ?  ?  ?  ? ?  ?Balance   ?  ?  ?  ?  ?  ?  ?  ?  ?  ?  ?  ?  ?  ?  ?  ?  ?  ?  ?   ? ?ADL either performed or assessed with clinical judgement  ? ?ADL Overall ADL's : Needs assistance/impaired ?Eating/Feeding: Set up;Sitting ?  ?Grooming: Dance movement psychotherapist;Wash/dry hands;Sitting ?Grooming Details (indicate cue type and reason): EOB ?Upper Body Bathing: Minimal assistance;Sitting ?  ?Lower Body Bathing: Moderate assistance;Sit to/from stand;Sitting/lateral leans ?  ?Upper Body Dressing : Set up;Sitting ?  ?Lower Body Dressing: Moderate assistance;Sit to/from stand;Sitting/lateral leans ?Lower Body Dressing Details (indicate cue type and reason): noted to have shakiness in BLE with seocnd attempt at standing with inability to take BUE off walker to assist with clothing managemnet ?Toilet Transfer: Minimal assistance;Rolling walker (2 wheels);BSC/3in1 ?Toilet Transfer Details (indicate cue type and reason): with increased time and continued education during task on where to place BLE and BUE for transitions. noted to need UE support on walker to maintain upright. patient was educated on difference between rollator and RW> ?  ?  ?  ?  ?  ?   ? ? ? ?  Vision Patient Visual Report: No change from baseline ?   ?   ?Perception   ?  ?Praxis   ?  ? ?Pertinent Vitals/Pain Pain Assessment ?Pain Assessment: No/denies pain  ? ? ? ?Hand Dominance Left ?  ?Extremity/Trunk  Assessment Upper Extremity Assessment ?Upper Extremity Assessment: RUE deficits/detail;LUE deficits/detail ?RUE Deficits / Details: patients ROM WFL, noted to have slight shake with movements, patient appears to not notice it. patient shoulder strength 3/5, elbow 3+/5, ?LUE Deficits / Details: patients ROM WFL, noted to have slight shake with movements, patient appears to not notice it. patient shoulder strength 3/5, elbow 3+/5, ?  ?Lower Extremity Assessment ?Lower Extremity Assessment:  (noted to have musculature difference in calf muscles with L> R) ?  ?  ?  ?Communication Communication ?Communication: No difficulties ?  ?Cognition Arousal/Alertness: Awake/alert ?Behavior During Therapy: [facility] for tasks assessed/performed ?Overall Cognitive Status: Within Functional Limits for tasks assessed ?  ?  ?  ?  ?  ?  ?  ?  ?  ?  ?  ?  ?  ?  ?  ?  ?General Comments: patient was oriented to self, month, day of week and year. patient stated date was 14th one day off. ?  ?  ?General Comments    ? ?  ?Exercises   ?  ?Shoulder Instructions    ? ? ?Home Living Family/patient expects to be discharged to:: Private residence ?Living Arrangements: Spouse/significant other ?Available Help at Discharge: Family;Available 24 hours/day ?Type of Home: House ?Home Access: Stairs to enter ?Entrance Stairs-Number of Steps: 3 ?Entrance Stairs-Rails: Right ?Home Layout: Two level;Able to live on main level with bedroom/bathroom ?Alternate Level Stairs-Number of Steps: 12 ?  ?Bathroom Shower/Tub: Walk-in shower ?  ?  ?  ?  ?Home Equipment: None ?  ?  ?  ? ?  ?Prior Functioning/Environment Prior Level of Function : Independent/Modified Independent ?  ?  ?  ?  ?  ?  ?  ?ADLs Comments: patient reported using rollator at home. patient reported that rollator is quick to get away from people. ?  ? ?  ?  ?OT Problem List: Decreased strength;Decreased activity tolerance;Impaired balance (sitting and/or standing);Decreased safety awareness;Decreased  cognition;Cardiopulmonary status limiting activity;Decreased knowledge of precautions;Decreased knowledge of use of DME or AE ?  ?   ?OT Treatment/Interventions: Self-care/ADL training;Therapeutic exercise;Neuromuscular education;Energy conservation;DME and/or AE instruction;Therapeutic activities;Balance training;Patient/family education  ?  ?OT Goals(Current goals can be found in the care plan section) Acute Rehab OT Goals ?Patient Stated Goal: to get back home with wife ?OT Goal Formulation: With patient ?Time For Goal Achievement: 02/18/22 ?Potential to Achieve Goals: Fair  ?OT Frequency: Min 2X/week ?  ? ?Co-evaluation   ?  ?  ?  ?  ? ?  ?AM-PAC OT "6 Clicks" Daily Activity     ?Outcome Measure Help from another person eating meals?: None ?Help from another person taking care of personal grooming?: A Little ?Help from another person toileting, which includes using toliet, bedpan, or urinal?: A Lot ?Help from another person bathing (including washing, rinsing, drying)?: A Lot ?Help from another person to put on and taking off regular upper body clothing?: A Little ?Help from another person to put on and taking off regular lower body clothing?: A Lot ?6 Click Score: 16 ?  ?End of Session Equipment Utilized During Treatment: Gait belt;Rolling walker (2 wheels) ?Nurse Communication: Other (comment) (IV beeping and request to return to bathroom) ? ?Activity Tolerance: Patient tolerated treatment well ?Patient  left: in chair;with call bell/phone within reach;with chair alarm set ? ?OT Visit Diagnosis: Unsteadiness on feet (R26.81);Other abnormalities of gait and mobility (R26.89);Repeated falls (R29.6)  ?              ?Time: 2103-1281 ?OT Time Calculation (min): 38 min ?Charges:  OT General Charges ?$OT Visit: 1 Visit ?OT Evaluation ?$OT Eval Moderate Complexity: 1 Mod ?OT Treatments ?$Self Care/Home Management : 23-37 mins ? ?Jrue Jarriel OTR/L, MS ?Acute Rehabilitation Department ?Office# [phone] ?Pager#  [phone] ? ? ?[place] ?02/04/2022, 10:41 AM ?

## 2022-02-05 ENCOUNTER — Inpatient Hospital Stay (HOSPITAL_COMMUNITY): Payer: 59

## 2022-02-05 DIAGNOSIS — R296 Repeated falls: Secondary | ICD-10-CM | POA: Diagnosis not present

## 2022-02-05 DIAGNOSIS — C3412 Malignant neoplasm of upper lobe, left bronchus or lung: Secondary | ICD-10-CM | POA: Diagnosis not present

## 2022-02-05 DIAGNOSIS — S2249XA Multiple fractures of ribs, unspecified side, initial encounter for closed fracture: Secondary | ICD-10-CM

## 2022-02-05 DIAGNOSIS — C7931 Secondary malignant neoplasm of brain: Secondary | ICD-10-CM | POA: Diagnosis not present

## 2022-02-05 DIAGNOSIS — R531 Weakness: Secondary | ICD-10-CM | POA: Diagnosis not present

## 2022-02-05 LAB — RENAL FUNCTION PANEL
Albumin: 3.5 g/dL (ref 3.5–5.0)
Anion gap: 8 (ref 5–15)
BUN: 22 mg/dL — ABNORMAL HIGH (ref 6–20)
CO2: 25 mmol/L (ref 22–32)
Calcium: 9.1 mg/dL (ref 8.9–10.3)
Chloride: 97 mmol/L — ABNORMAL LOW (ref 98–111)
Creatinine, Ser: 0.81 mg/dL (ref 0.61–1.24)
GFR, Estimated: >60 mL/min (ref >60)
Glucose, Bld: 109 mg/dL — ABNORMAL HIGH (ref 70–99)
Phosphorus: 4.2 mg/dL (ref 2.5–4.6)
Potassium: 4.2 mmol/L (ref 3.5–5.1)
Sodium: 130 mmol/L — ABNORMAL LOW (ref 135–145)

## 2022-02-05 LAB — CBC
HCT: 31.4 % — ABNORMAL LOW (ref 39.0–52.0)
Hemoglobin: 11.1 g/dL — ABNORMAL LOW (ref 13.0–17.0)
MCH: 36.8 pg — ABNORMAL HIGH (ref 26.0–34.0)
MCHC: 35.4 g/dL (ref 30.0–36.0)
MCV: 104 fL — ABNORMAL HIGH (ref 80.0–100.0)
Platelets: 140 10*3/uL — ABNORMAL LOW (ref 150–400)
RBC: 3.02 MIL/uL — ABNORMAL LOW (ref 4.22–5.81)
RDW: 13.2 % (ref 11.5–15.5)
WBC: 3.4 10*3/uL — ABNORMAL LOW (ref 4.0–10.5)
nRBC: 0 % (ref 0.0–0.2)

## 2022-02-05 LAB — MAGNESIUM: Magnesium: 1.5 mg/dL — ABNORMAL LOW (ref 1.7–2.4)

## 2022-02-05 MED ORDER — MAGNESIUM SULFATE 2 GM/50ML IV SOLN
2.0000 g | Freq: Once | INTRAVENOUS | Status: AC
Start: 2022-02-05 — End: 2022-02-05
  Administered 2022-02-05: 2 g via INTRAVENOUS
  Filled 2022-02-05: qty 50

## 2022-02-05 MED ORDER — DEXAMETHASONE 4 MG PO TABS
2.0000 mg | ORAL_TABLET | Freq: Two times a day (BID) | ORAL | Status: DC
  Administered 2022-02-05 – 2022-02-06 (×2): 2 mg via ORAL
  Filled 2022-02-05 (×2): qty 1

## 2022-02-05 NOTE — PMR Pre-admission (Signed)
PMR Admission Coordinator Pre-Admission Assessment ? ?Patient: Richard Santos is an 61 y.o., male ?MRN: [id number] ?DOB: 13-Jul-1961 ?Height: 6' (182.9 cm) ?Weight: 59 kg ? ?Insurance Information ?HMO: Yes    PPO:      PCP:      IPA:      80/20:      OTHER:  ?PRIMARY: [employer]      Policy#: [id number]      Subscriber: patient ?CM Name: n/a       Phone#: [phone]     Fax#: [phone] ?Pre-Cert#: [id number]    Representative authorization for 3/17, with daily updates due via fax  ?  Employer: Disabled ?Benefits:  Phone #: [phone]     Name:  ?Eff. Date: 11/23/21     Deduct: $0      Out of Pocket Max: $[phone] (met [phone])      Life Max: N/A ?CIR: 50% coverage      SNF: 50% coverage, limited to 60 days/year ?Outpatient: 85% coverage, limited to 30 visits combined     Co-Pay: 15% ?Home Health: 50% limited to 120 visits/year    Co-Pay: 50% ?DME: 50%     Co-Pay: 50% ?Providers: in network ? ?SECONDARY:       Policy#:      Phone#:  ? ?Financial Counselor:       Phone#:  ? ?The ?Data Collection Information Summary? for patients in Inpatient Rehabilitation Facilities with attached ?Privacy Act [place] Records? was provided and verbally reviewed with: N/A ? ?Emergency Contact Information ?Contact Information   ? ? Name Relation Home Work Mobile  ? Drue, Harr Spouse [phone]  [phone]  ? ?  ? ? ?Current Medical History  ?Patient Admitting Diagnosis: Debility ? ?History of Present Illness: A 61 y.o. 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  He is followed by Dr. Mickeal Skinner for brain metastasis in the recently had whole brain radiation last month.  He has been on Decadron since the radiation and dose is being tapered.  He went from 2 mg a day to 1 mg a day a week ago.  For the last 2 days he has had increased weakness and decreased  ability to walk with increased weakness in the right leg and he has been more unsteady and dragging his right leg.  He has had multiple falls over the last few days.  Wife states he is fallen 4 times today.  No loss of consciousness.  He does report he hit his head once on the floor but was very light and has no residual headache and no injury from the falls.  He has not had any fever, cough, nausea vomiting or diarrhea.  PT/OT evaluations completed with recommendations for post acute rehab admission. ?  ?Patient's [facility] from [facility] has been reviewed by the rehabilitation admission coordinator and physician. ? ?Past Medical History  ?Past Medical History:  ?Diagnosis Date  ? Allergic rhinitis   ? Allergy   ? Anxiety   ? Chronic low back pain   ? DDD (degenerative disc disease), lumbar   ? ED (erectile dysfunction)   ? Finger injury   ? cut pads off 3rd and 4th finger left hand/due to lawn mower accident  ? GERD (gastroesophageal reflux disease)   ? Heart murmur   ? as child only-   ? History of kidney stones   ?  HTN (hypertension) 08/20/2016  ? Hyperlipidemia   ? Incomplete right bundle branch block   ? Prostate cancer [facility]) dx 10/02/14  ? stage T1c  ? Sigmoid diverticulosis   ? Small cell lung cancer ([place]) 03/2020  ? Wears glasses   ? ? ?Has the patient had major surgery during 100 days prior to admission? No ? ?Family History   ?family history includes Breast cancer in his sister; Heart disease in his father, mother, and another family member; Prostate cancer in his father. ? ?Current Medications ? ?Current Facility-Administered Medications:  ?  acetaminophen (TYLENOL) tablet 650 mg, 650 mg, Oral, Q6H PRN, 650 mg at 02/04/22 2113 **OR** acetaminophen (TYLENOL) suppository 650 mg, 650 mg, Rectal, Q6H PRN, Chotiner, Yevonne Aline, MD ?  aspirin EC tablet 81 mg, 81 mg, Oral, Daily, Chotiner, Yevonne Aline, MD, 81 mg at 02/05/22 1046 ?  Chlorhexidine Gluconate Cloth 2 % PADS 6 each, 6 each, Topical,  Daily, Mercy Riding, MD, 6 each at 02/05/22 1046 ?  dexamethasone (DECADRON) tablet 2 mg, 2 mg, Oral, Q12H, Gonfa, Taye T, MD ?  divalproex (DEPAKOTE) DR tablet 250 mg, 250 mg, Oral, BID, Chotiner, Yevonne Aline, MD, 250 mg at 02/05/22 1046 ?  enoxaparin (LOVENOX) injection 40 mg, 40 mg, Subcutaneous, Q24H, Chotiner, Yevonne Aline, MD, 40 mg at 34/19/37 9024 ?  folic acid (FOLVITE) tablet 1 mg, 1 mg, Oral, Daily, Chotiner, Yevonne Aline, MD, 1 mg at 02/05/22 1046 ?  ipratropium-albuterol (DUONEB) 0.5-2.5 (3) MG/3ML nebulizer solution 3 mL, 3 mL, Nebulization, Q6H PRN, Chotiner, Yevonne Aline, MD ?  LORazepam (ATIVAN) tablet 1-4 mg, 1-4 mg, Oral, Q1H PRN **OR** LORazepam (ATIVAN) injection 1-4 mg, 1-4 mg, Intravenous, Q1H PRN, Chotiner, Yevonne Aline, MD ?  umeclidinium bromide (INCRUSE ELLIPTA) 62.5 MCG/ACT 1 puff, 1 puff, Inhalation, Daily, 1 puff at 02/05/22 0808 **AND** mometasone-formoterol (DULERA) 100-5 MCG/ACT inhaler 2 puff, 2 puff, Inhalation, BID, Chotiner, Yevonne Aline, MD, 2 puff at 02/05/22 0808 ?  multivitamin with minerals tablet 1 tablet, 1 tablet, Oral, Daily, Chotiner, Yevonne Aline, MD, 1 tablet at 02/05/22 1046 ?  ondansetron (ZOFRAN) tablet 4 mg, 4 mg, Oral, Q6H PRN **OR** ondansetron (ZOFRAN) injection 4 mg, 4 mg, Intravenous, Q6H PRN, Chotiner, Yevonne Aline, MD ?  senna-docusate (Senokot-S) tablet 1 tablet, 1 tablet, Oral, QHS PRN, Chotiner, Yevonne Aline, MD ?  thiamine tablet 100 mg, 100 mg, Oral, Daily, 100 mg at 02/05/22 1046 **OR** thiamine (B-1) injection 100 mg, 100 mg, Intravenous, Daily, Chotiner, Yevonne Aline, MD ? ?Facility-Administered Medications Ordered in Other Encounters:  ?  heparin lock flush 100 unit/mL, 500 Units, Intracatheter, Daily PRN, Curt Bears, MD ?  sodium chloride flush (NS) 0.9 % injection 10 mL, 10 mL, Intracatheter, PRN, Curt Bears, MD ?  sodium chloride flush (NS) 0.9 % injection 10 mL, 10 mL, Intracatheter, PRN, Curt Bears, MD, 10 mL at 07/16/21 1606 ? ?Patients Current Diet:   ?Diet Order   ? ?       ?  Diet regular Room service appropriate? Yes; Fluid consistency: Thin  Diet effective now       ?  ? ?  ?  ? ?  ? ? ?Precautions / Restrictions ?Precautions ?Precautions: Fall ?Precaution Comments: truncal and limb ataxia ?Restrictions ?Weight Bearing Restrictions: No  ? ?Has the patient had 2 or more falls or a fall with injury in the past year? Yes ? ?Prior Activity Level ?Limited Community (1-2x/wk): Went out 1-2 times a week to eat or to go to  appointments. ? ?Prior Functional Level ?Self Care: Did the patient need help bathing, dressing, using the toilet or eating? Independent ? ?Indoor Mobility: Did the patient need assistance with walking from room to room (with or without device)? Independent ? ?Stairs: Did the patient need assistance with internal or external stairs (with or without device)? Independent ? ?Functional Cognition: Did the patient need help planning regular tasks such as shopping or remembering to take medications? Needed some help ? ?Patient Information ?Are you of Hispanic, Latino/a,or Spanish origin?: A. No, not of Hispanic, Latino/a, or Spanish origin ?What is your race?: A. White ?Do you need or want an interpreter to communicate with a doctor or health care staff?: 0. No ? ?Patient's Response To:  ?Health Literacy and Transportation ?Is the patient able to respond to health literacy and transportation needs?: Yes ?Health Literacy - How often do you need to have someone help you when you read instructions, pamphlets, or other written material from your doctor or pharmacy?: Sometimes ?In the past 12 months, has lack of transportation kept you from medical appointments or from getting medications?: No ?In the past 12 months, has lack of transportation kept you from meetings, work, or from getting things needed for daily living?: No ? ?Home Assistive Devices / Equipment ?Home Assistive Devices/Equipment: Gilford Rile (specify type) ?Home Equipment: Rollator (4 wheels),  Cane - quad, BSC/3in1 ? ?Prior Device Use: Indicate devices/aids used by the patient prior to current illness, exacerbation or injury? Walker and [employer] ? ?Current Functional Level ?Cognition ? Overall Cognitive S

## 2022-02-05 NOTE — Assessment & Plan Note (Signed)
5 to 10% left-sided pneumothorax noted on CT chest.  Patient has no respiratory distress.  Repeat CXR without pneumothorax. ?-Monitor respiratory status ?

## 2022-02-05 NOTE — Assessment & Plan Note (Signed)
CT chest showed acute/subacute anterior right ninth and 10th rib fracture.  Patient is not in pain. ?-Supportive care ?

## 2022-02-05 NOTE — Assessment & Plan Note (Addendum)
Mg 1.7 ?-IV magnesium sulfate 2 g x 1 prior to discharge ?

## 2022-02-05 NOTE — Progress Notes (Signed)
Initial Nutrition Assessment ? ?DOCUMENTATION CODES:  ? ?Severe malnutrition in context of chronic illness, Underweight ? ?INTERVENTION:  ? ?-Diet liberalization to regular given severe malnutrition ? ?-Magic cup TID with meals, each supplement provides 290 kcal and 9 grams of protein ? ?-Double protein portions with meals ? ?-Check Vitamin B6 and thiamine given alcohol use and numbness/tingling/weakness in LEs ? ? ?NUTRITION DIAGNOSIS:  ? ?Severe Malnutrition related to chronic illness, catabolic illness, cancer and cancer related treatments as evidenced by severe fat depletion, severe muscle depletion, percent weight loss. ? ?GOAL:  ? ?Patient will meet greater than or equal to 90% of their needs ? ?MONITOR:  ? ?PO intake, Supplement acceptance, Labs, Weight trends, I & O's ? ?REASON FOR ASSESSMENT:  ? ?Consult ?Assessment of nutrition requirement/status ? ?ASSESSMENT:  ? ?61 year old M with PMH of SCLC with mets to brain followed by Dr. Julien Nordmann and Mickeal Skinner, DDD, EtOH abuse, HTN and tobacco use disorder presenting with lower extremity weakness, right> left and recurrent falls, and admitted for the same. ? ?Patient in room, eating his lunch of pizza. Pt denies any issues chewing, swallowing or taste changes. States he has been eating better and thinks he has gained weight. Pt consumed 50-100% of all 3 meals yesterday. On a heart healthy diet, will liberalize so pt is not restricted given severe malnutrition.  ?Pt reports numbness and tingling in RLE. Pt with brain mets.  ?Pt drinks 3 beers daily. MD has ordered to check Vitamin B-12, folate and iron anemia panel. Will also check thiamine, Vitamin B6 as well.  ? ?Pt does not like milky supplements. States he does not use any supplements at home. When asked if he aims to eat more protein at home, he shakes his head. When asked what he does consume at home, he replies "anything and everything I can get my hands on".  ?Pt is not willing to drink supplements during this  admission. Encouraged him to continue to eat most of his meals. Will add Magic cups with meals and have double protein portions with meals as well. ? ?Per weight records, pt has lost 18 lbs since 10/08/21 (12% wt loss x 4 months, significant for time frame).   ? ?NUTRITION - FOCUSED PHYSICAL EXAM: ? ?Flowsheet Row Most Recent Value  ?Orbital Region Moderate depletion  ?Upper Arm Region Severe depletion  ?Thoracic and Lumbar Region Unable to assess  [rib fractures]  ?Buccal Region Moderate depletion  ?Temple Region Moderate depletion  ?Clavicle Bone Region Severe depletion  ?Clavicle and Acromion Bone Region Severe depletion  ?Scapular Bone Region Severe depletion  ?Dorsal Hand Moderate depletion  ?Patellar Region Severe depletion  ?Anterior Thigh Region Severe depletion  ?Posterior Calf Region Severe depletion  ?Edema (RD Assessment) None  ?Hair Unable to assess  ?Eyes Reviewed  ?Mouth Reviewed  ?Skin Reviewed  ? ?  ? ? ?Diet Order:   ?Diet Order   ? ?       ?  Diet Heart Room service appropriate? Yes; Fluid consistency: Thin  Diet effective now       ?  ? ?  ?  ? ?  ? ? ?EDUCATION NEEDS:  ? ?Not appropriate for education at this time ? ?Skin:  Skin Assessment: Reviewed RN Assessment ? ?Last BM:  3/15 -type 4 ? ?Height:  ? ?Ht Readings from Last 1 Encounters:  ?02/03/22 6' (1.829 m)  ? ? ?Weight:  ? ?Wt Readings from Last 1 Encounters:  ?02/03/22 59 kg  ? ? ?BMI:  Body mass index is 17.63 kg/m?. ? ?Estimated Nutritional Needs:  ? ?Kcal:  2200-2400 ? ?Protein:  120-130g ? ?Fluid:  2.2L/day ? ?Clayton Bibles, MS, RD, LDN ?Inpatient Clinical Dietitian ?Contact information available via Amion ? ?

## 2022-02-05 NOTE — Progress Notes (Signed)
?PROGRESS NOTE ? ?Richard Santos TOI:[id number] DOB: 10-14-1961  ? ?PCP: Curt Bears, MD ? ?Patient is from: Home.  Lives with his wife.  Uses walker at baseline.   ? ?DOA: 02/03/2022 LOS: 1 ? ?Chief complaints ?Chief Complaint  ?Patient presents with  ? Weakness  ? Fall  ? Urinary Retention  ?  ? 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 Julien Nordmann and Mickeal Skinner, DDD, EtOH abuse, HTN and tobacco use disorder presenting with lower extremity weakness, right> left and recurrent falls, and admitted for the same.  He was on steroid taper for his brain mets and went from 2 mg a day to 1 mg a day a week prior to presentation.  He had about 4 episodes of fall the day of presentation but no LOC or significant traumatic injury.   ? ?CT head without contrast with no evidence of acute infarct or hemorrhage.  He was started on IV Decadron 2 mg twice daily.  Neurooncology recommended MRI brain which showed significant interval decrease in the size of previously noted enhancing lesions in the brain.  Evaluated by oncology and had CT chest/abdomen/pelvis that showed new 5 to 10% left-sided pneumothorax and new acute/subacute right anterior ninth and 10th ribs fracture but no other acute finding.  ? ?Two view CXR on 3/16 with no evidence of pneumothorax.  Patient remains stable from respiratory standpoint. ? ?Therapy recommended CIR.   ? ?Subjective: ?Seen and examined earlier this morning.  No major events overnight of this morning.  Continues to endorse some right leg weakness and numbness.  No other complaints. ? ?Objective: ?Vitals:  ? 02/04/22 1332 02/04/22 2032 02/05/22 0428 02/05/22 0809  ?BP: 119/87 116/89 111/62   ?Pulse: 67 79 72   ?Resp: 16 14 14    ?Temp: 97.6 ?F (36.4 ?C) 97.9 ?F (36.6 ?C) 97.6 ?F (36.4 ?C)   ?TempSrc: Oral Oral Oral   ?SpO2: 100% 100% 100% 100%  ?Weight:      ?Height:      ? ? ?Examination: ? ?GENERAL: No apparent distress.  Nontoxic. ?HEENT:  MMM.  Vision and hearing grossly intact.  ?NECK: Supple.  No apparent JVD.  ?RESP:  No IWOB.  Fair aeration bilaterally. ?CVS:  RRR. Heart sounds normal.  ?ABD/GI/GU: BS+. Abd soft, NTND.  ?MSK/EXT:  Moves extremities. No apparent deformity. No edema.  ?SKIN: no apparent skin lesion or wound ?NEURO: Awake and alert. Oriented appropriately.  Motor 4/5 in RLE with hip flexion.  5/5 in LLE, RUE and LUE.  Patellar reflex symmetric on both sides. ?PSYCH: Calm. Normal affect.  ? ?Procedures:  ?None ? ?Microbiology summarized: ?COVID-19 and influenza PCR nonreactive. ? ?Assessment and Plan: ?* Generalized weakness ?Reportedly, weakness worsened when decadron was reduced to 1 mg a day a week ago.  Weakness is more pronounced in RLE.  No LOC to suspect syncope.  Unlikely seizure.  CT head without contrast without acute finding.  Patient has SCLC with brain mets.  Started on IV Decadron 2 mg twice daily.  Repeat MRI brain with decrease in size of tumor burden.  ?-Change Decadron to p.o., 2 mg twice daily ?-Continue home Depakote for seizure prophylaxis. ?-Stop tramadol on discharge-for lower his seizure threshold. ?-Continue PT/OT-recommended CIR. ?-Fall precaution ? ?Recurrent falls ?Could be due to generalized weakness, alcohol or adrenal insufficiency from chronic steroid use ?-See generalized weakness above ? ?Metastatic cancer to brain [facility]) ?Decadron as above. ? ?Small cell lung cancer,  left upper lobe ([place]) ?Followed by Dr. Julien Nordmann for [facility] and Dr. Mickeal Skinner for brain mets.  MRI brain with interval decrease in brain mets.  CT chest/abdomen/pelvis with stable postradiation change and a sclerotic metastatic disease ?-Appreciate input by oncology and radiation oncology. ? ?Pneumothorax ?5 to 10% left-sided pneumothorax noted on CT chest.  Patient has no respiratory distress.  Repeat CXR without pneumothorax. ?-Monitor respiratory status ? ?Multiple rib fractures ?CT chest showed acute/subacute anterior right ninth and 10th  rib fracture.  Patient is not in pain. ?-Supportive care ? ?Pancytopenia ([place]) ?Relatively stable.  There could be some element of hemodilution. ?-Continue monitoring ? ?Alcohol use ?Continues to drink 3 beers a day. Used to drink more.  ?-Continue CIWA protocol. ? ?Hyponatremia ?Recheck in the morning ? ?Tobacco abuse ?Continues to smoke a few cigarettes a day ?Encourage cessation ?Nicotine patch if needed ? ?HTN (hypertension) ?Does not seem to be on medication for this. ?-Continue monitoring ? ?Hypomagnesemia ?Mg 1.5. ?-IV magnesium sulfate 2 g x 1 ? ?Protein-calorie malnutrition, severe ?Body mass index is 17.63 kg/m?Marland Kitchen  Has significant muscle mass and subcu fat loss. ?Consult dietitian ? ? ? ? ?DVT prophylaxis:  ?enoxaparin (LOVENOX) injection 40 mg Start: 02/04/22 1000 ? ?Code Status: Full code ?Family Communication: Updated patient's wife over the phone. ?Level of care: Med-Surg ?Status is: Inpatient ?The patient will remain inpatient because: Safe disposition/CIR ? ? ?Final disposition: CIR ? ?Consultants:  ?Neurooncology ?Oncology ? ?Sch Meds:  ?Scheduled Meds: ? aspirin EC  81 mg Oral Daily  ? Chlorhexidine Gluconate Cloth  6 each Topical Daily  ? dexamethasone  2 mg Oral Q12H  ? divalproex  250 mg Oral BID  ? enoxaparin (LOVENOX) injection  40 mg Subcutaneous U13K  ? folic acid  1 mg Oral Daily  ? umeclidinium bromide  1 puff Inhalation Daily  ? And  ? mometasone-formoterol  2 puff Inhalation BID  ? multivitamin with minerals  1 tablet Oral Daily  ? thiamine  100 mg Oral Daily  ? Or  ? thiamine  100 mg Intravenous Daily  ? ?Continuous Infusions: ? magnesium sulfate bolus IVPB 2 g (02/05/22 1046)  ? ?PRN Meds:.acetaminophen **OR** acetaminophen, ipratropium-albuterol, LORazepam **OR** LORazepam, ondansetron **OR** ondansetron (ZOFRAN) IV, senna-docusate ? ?Antimicrobials: ?Anti-infectives (From admission, onward)  ? ? None  ? ?  ? ? ? ?I have personally reviewed the following labs and images: ?CBC: ?Recent  Labs  ?Lab 02/03/22 ?1905 02/04/22 ?4401 02/05/22 ?0272  ?WBC 5.1 3.9* 3.4*  ?NEUTROABS 4.0  --   --   ?HGB 10.6* 10.1* 11.1*  ?HCT 30.3* 29.3* 31.4*  ?MCV 104.5* 105.0* 104.0*  ?PLT 146* 131* 140*  ? ?BMP &GFR ?Recent Labs  ?Lab 02/03/22 ?1905 02/04/22 ?5366 02/05/22 ?4403  ?NA 134* 135 130*  ?K 3.9 3.9 4.2  ?CL 98 100 97*  ?CO2 26 27 25   ?GLUCOSE 103* 88 109*  ?BUN 31* 24* 22*  ?CREATININE 1.23 0.85 0.81  ?CALCIUM 9.1 9.0 9.1  ?MG  --   --  1.5*  ?PHOS  --   --  4.2  ? ?Estimated Creatinine Clearance: 80.9 mL/min (by C-G formula based on SCr of 0.81 mg/dL). ?Liver & Pancreas: ?Recent Labs  ?Lab 02/03/22 ?1905 02/05/22 ?4742  ?AST 27  --   ?ALT 28  --   ?ALKPHOS 40  --   ?BILITOT 0.5  --   ?PROT 6.7  --   ?ALBUMIN 3.7 3.5  ? ?No results for input(s): LIPASE, AMYLASE in the  last 168 hours. ?No results for input(s): AMMONIA in the last 168 hours. ?Diabetic: ?No results for input(s): HGBA1C in the last 72 hours. ?No results for input(s): GLUCAP in the last 168 hours. ?Cardiac Enzymes: ?No results for input(s): CKTOTAL, CKMB, CKMBINDEX, TROPONINI in the last 168 hours. ?No results for input(s): PROBNP in the last 8760 hours. ?Coagulation Profile: ?No results for input(s): INR, PROTIME in the last 168 hours. ?Thyroid Function Tests: ?No results for input(s): TSH, T4TOTAL, FREET4, T3FREE, THYROIDAB in the last 72 hours. ?Lipid Profile: ?No results for input(s): CHOL, HDL, LDLCALC, TRIG, CHOLHDL, LDLDIRECT in the last 72 hours. ?Anemia Panel: ?No results for input(s): VITAMINB12, FOLATE, FERRITIN, TIBC, IRON, RETICCTPCT in the last 72 hours. ?Urine analysis: ?   ?Component Value Date/Time  ? [place] YELLOW 02/03/2022 2022  ? APPEARANCEUR CLEAR 02/03/2022 2022  ? LABSPEC 1.019 02/03/2022 2022  ? PHURINE 6.0 02/03/2022 2022  ? [place] NEGATIVE 02/03/2022 2022  ? GLUCOSEU NEGATIVE 12/12/2019 1506  ? [place] NEGATIVE 02/03/2022 2022  ? [place] NEGATIVE 02/03/2022 2022  ? KETONESUR 5 (A) 02/03/2022 2022  ? [place]  NEGATIVE 02/03/2022 2022  ? UROBILINOGEN 0.2 12/12/2019 1506  ? NITRITE NEGATIVE 02/03/2022 2022  ? LEUKOCYTESUR NEGATIVE 02/03/2022 2022  ? ?Sepsis Labs: ?Invalid input(s): PROCALCITONIN, LACTICIDVEN ? ?Mic

## 2022-02-05 NOTE — Progress Notes (Signed)
[facility] rehab admissions - I spoke with patient's wife by phone.  Wife is interested in CIR at [facility] for rehab.  I will open the case with insurance carrier and see authorization for CIR.  I will follow up once I hear back from insurance case manager.  Call for questions.  [phone] ?

## 2022-02-06 ENCOUNTER — Other Ambulatory Visit: Payer: Self-pay

## 2022-02-06 ENCOUNTER — Inpatient Hospital Stay (HOSPITAL_COMMUNITY): Payer: 59

## 2022-02-06 ENCOUNTER — Inpatient Hospital Stay (HOSPITAL_COMMUNITY)
Admission: RE | Admit: 2022-02-06 | Discharge: 2022-02-20 | DRG: 945 | Disposition: A | Discharge status: Hospice | Payer: 59 | Source: Intra-hospital | Attending: Physical Medicine & Rehabilitation | Admitting: Physical Medicine & Rehabilitation

## 2022-02-06 DIAGNOSIS — Z79899 Other long term (current) drug therapy: Secondary | ICD-10-CM

## 2022-02-06 DIAGNOSIS — M4716 Other spondylosis with myelopathy, lumbar region: Secondary | ICD-10-CM | POA: Diagnosis present

## 2022-02-06 DIAGNOSIS — G9520 Unspecified cord compression: Secondary | ICD-10-CM

## 2022-02-06 DIAGNOSIS — G8929 Other chronic pain: Secondary | ICD-10-CM | POA: Diagnosis present

## 2022-02-06 DIAGNOSIS — R531 Weakness: Secondary | ICD-10-CM | POA: Diagnosis not present

## 2022-02-06 DIAGNOSIS — M5136 Other intervertebral disc degeneration, lumbar region: Secondary | ICD-10-CM | POA: Diagnosis present

## 2022-02-06 DIAGNOSIS — Z8249 Family history of ischemic heart disease and other diseases of the circulatory system: Secondary | ICD-10-CM

## 2022-02-06 DIAGNOSIS — Z85118 Personal history of other malignant neoplasm of bronchus and lung: Secondary | ICD-10-CM | POA: Diagnosis not present

## 2022-02-06 DIAGNOSIS — C7951 Secondary malignant neoplasm of bone: Secondary | ICD-10-CM | POA: Diagnosis present

## 2022-02-06 DIAGNOSIS — Z888 Allergy status to other drugs, medicaments and biological substances status: Secondary | ICD-10-CM | POA: Diagnosis not present

## 2022-02-06 DIAGNOSIS — R5381 Other malaise: Principal | ICD-10-CM | POA: Diagnosis present

## 2022-02-06 DIAGNOSIS — S2241XD Multiple fractures of ribs, right side, subsequent encounter for fracture with routine healing: Secondary | ICD-10-CM

## 2022-02-06 DIAGNOSIS — Z7982 Long term (current) use of aspirin: Secondary | ICD-10-CM

## 2022-02-06 DIAGNOSIS — Z8546 Personal history of malignant neoplasm of prostate: Secondary | ICD-10-CM

## 2022-02-06 DIAGNOSIS — Z7189 Other specified counseling: Secondary | ICD-10-CM | POA: Diagnosis not present

## 2022-02-06 DIAGNOSIS — Z7951 Long term (current) use of inhaled steroids: Secondary | ICD-10-CM

## 2022-02-06 DIAGNOSIS — I10 Essential (primary) hypertension: Secondary | ICD-10-CM | POA: Diagnosis present

## 2022-02-06 DIAGNOSIS — C3412 Malignant neoplasm of upper lobe, left bronchus or lung: Secondary | ICD-10-CM | POA: Diagnosis not present

## 2022-02-06 DIAGNOSIS — Z923 Personal history of irradiation: Secondary | ICD-10-CM | POA: Diagnosis not present

## 2022-02-06 DIAGNOSIS — I951 Orthostatic hypotension: Secondary | ICD-10-CM | POA: Diagnosis present

## 2022-02-06 DIAGNOSIS — S80212D Abrasion, left knee, subsequent encounter: Secondary | ICD-10-CM

## 2022-02-06 DIAGNOSIS — Z515 Encounter for palliative care: Secondary | ICD-10-CM | POA: Diagnosis not present

## 2022-02-06 DIAGNOSIS — F17210 Nicotine dependence, cigarettes, uncomplicated: Secondary | ICD-10-CM | POA: Diagnosis present

## 2022-02-06 DIAGNOSIS — W19XXXD Unspecified fall, subsequent encounter: Secondary | ICD-10-CM | POA: Diagnosis present

## 2022-02-06 DIAGNOSIS — G8220 Paraplegia, unspecified: Secondary | ICD-10-CM | POA: Diagnosis present

## 2022-02-06 DIAGNOSIS — D61818 Other pancytopenia: Secondary | ICD-10-CM | POA: Diagnosis present

## 2022-02-06 DIAGNOSIS — R339 Retention of urine, unspecified: Secondary | ICD-10-CM | POA: Diagnosis not present

## 2022-02-06 DIAGNOSIS — E785 Hyperlipidemia, unspecified: Secondary | ICD-10-CM | POA: Diagnosis present

## 2022-02-06 DIAGNOSIS — N319 Neuromuscular dysfunction of bladder, unspecified: Secondary | ICD-10-CM | POA: Diagnosis present

## 2022-02-06 DIAGNOSIS — Z882 Allergy status to sulfonamides status: Secondary | ICD-10-CM

## 2022-02-06 DIAGNOSIS — C801 Malignant (primary) neoplasm, unspecified: Secondary | ICD-10-CM | POA: Diagnosis not present

## 2022-02-06 DIAGNOSIS — M545 Low back pain, unspecified: Secondary | ICD-10-CM | POA: Diagnosis present

## 2022-02-06 DIAGNOSIS — Z91030 Bee allergy status: Secondary | ICD-10-CM

## 2022-02-06 DIAGNOSIS — K592 Neurogenic bowel, not elsewhere classified: Secondary | ICD-10-CM | POA: Diagnosis present

## 2022-02-06 DIAGNOSIS — C7931 Secondary malignant neoplasm of brain: Secondary | ICD-10-CM | POA: Diagnosis present

## 2022-02-06 DIAGNOSIS — Z8042 Family history of malignant neoplasm of prostate: Secondary | ICD-10-CM | POA: Diagnosis not present

## 2022-02-06 DIAGNOSIS — K5900 Constipation, unspecified: Secondary | ICD-10-CM | POA: Diagnosis not present

## 2022-02-06 DIAGNOSIS — C3490 Malignant neoplasm of unspecified part of unspecified bronchus or lung: Secondary | ICD-10-CM | POA: Diagnosis not present

## 2022-02-06 DIAGNOSIS — S270XXD Traumatic pneumothorax, subsequent encounter: Secondary | ICD-10-CM

## 2022-02-06 LAB — RENAL FUNCTION PANEL
Albumin: 3.3 g/dL — ABNORMAL LOW (ref 3.5–5.0)
Anion gap: 8 (ref 5–15)
BUN: 24 mg/dL — ABNORMAL HIGH (ref 6–20)
CO2: 26 mmol/L (ref 22–32)
Calcium: 8.6 mg/dL — ABNORMAL LOW (ref 8.9–10.3)
Chloride: 98 mmol/L (ref 98–111)
Creatinine, Ser: 0.74 mg/dL (ref 0.61–1.24)
GFR, Estimated: >60 mL/min (ref >60)
Glucose, Bld: 102 mg/dL — ABNORMAL HIGH (ref 70–99)
Phosphorus: 4 mg/dL (ref 2.5–4.6)
Potassium: 3.8 mmol/L (ref 3.5–5.1)
Sodium: 132 mmol/L — ABNORMAL LOW (ref 135–145)

## 2022-02-06 LAB — IRON AND TIBC
Iron: 124 ug/dL (ref 45–182)
Saturation Ratios: 42 % — ABNORMAL HIGH (ref 17.9–39.5)
TIBC: 295 ug/dL (ref 250–450)
UIBC: 171 ug/dL

## 2022-02-06 LAB — CBC
HCT: 29.1 % — ABNORMAL LOW (ref 39.0–52.0)
Hemoglobin: 10.2 g/dL — ABNORMAL LOW (ref 13.0–17.0)
MCH: 36.7 pg — ABNORMAL HIGH (ref 26.0–34.0)
MCHC: 35.1 g/dL (ref 30.0–36.0)
MCV: 104.7 fL — ABNORMAL HIGH (ref 80.0–100.0)
Platelets: 152 10*3/uL (ref 150–400)
RBC: 2.78 MIL/uL — ABNORMAL LOW (ref 4.22–5.81)
RDW: 13.2 % (ref 11.5–15.5)
WBC: 3.9 10*3/uL — ABNORMAL LOW (ref 4.0–10.5)
nRBC: 0 % (ref 0.0–0.2)

## 2022-02-06 LAB — FOLATE: Folate: 14 ng/mL (ref >5.9)

## 2022-02-06 LAB — FERRITIN: Ferritin: 812 ng/mL — ABNORMAL HIGH (ref 24–336)

## 2022-02-06 LAB — MAGNESIUM: Magnesium: 1.7 mg/dL (ref 1.7–2.4)

## 2022-02-06 LAB — CK: Total CK: 26 U/L — ABNORMAL LOW (ref 49–397)

## 2022-02-06 LAB — RETICULOCYTES
Immature Retic Fract: 24.9 % — ABNORMAL HIGH (ref 2.3–15.9)
RBC.: 2.77 MIL/uL — ABNORMAL LOW (ref 4.22–5.81)
Retic Count, Absolute: 68.4 10*3/uL (ref 19.0–186.0)
Retic Ct Pct: 2.5 % (ref 0.4–3.1)

## 2022-02-06 LAB — VITAMIN B12: Vitamin B-12: 218 pg/mL (ref 180–914)

## 2022-02-06 MED ORDER — FOLIC ACID 1 MG PO TABS
1.0000 mg | ORAL_TABLET | Freq: Every day | ORAL | Status: DC
  Administered 2022-02-07 – 2022-02-20 (×14): 1 mg via ORAL
  Filled 2022-02-06 (×15): qty 1

## 2022-02-06 MED ORDER — SENNOSIDES-DOCUSATE SODIUM 8.6-50 MG PO TABS
1.0000 | ORAL_TABLET | Freq: Every day | ORAL | Status: DC
  Administered 2022-02-06 – 2022-02-15 (×10): 1 via ORAL
  Filled 2022-02-06 (×9): qty 1

## 2022-02-06 MED ORDER — LORAZEPAM 1 MG PO TABS: 1.0000 mg | ORAL_TABLET | ORAL | Status: AC | PRN

## 2022-02-06 MED ORDER — MAGNESIUM SULFATE 2 GM/50ML IV SOLN
2.0000 g | Freq: Once | INTRAVENOUS | Status: AC
Start: 2022-02-06 — End: 2022-02-06
  Administered 2022-02-06: 2 g via INTRAVENOUS
  Filled 2022-02-06: qty 50

## 2022-02-06 MED ORDER — ALUM & MAG HYDROXIDE-SIMETH 200-200-20 MG/5ML PO SUSP
30.0000 mL | ORAL | Status: DC | PRN
  Administered 2022-02-16: 30 mL via ORAL
  Filled 2022-02-06: qty 30

## 2022-02-06 MED ORDER — UMECLIDINIUM BROMIDE 62.5 MCG/ACT IN AEPB
1.0000 | INHALATION_SPRAY | Freq: Every day | RESPIRATORY_TRACT | Status: DC
  Administered 2022-02-07 – 2022-02-20 (×13): 1 via RESPIRATORY_TRACT
  Filled 2022-02-06 (×3): qty 7

## 2022-02-06 MED ORDER — PROCHLORPERAZINE 25 MG RE SUPP: 12.5000 mg | Freq: Four times a day (QID) | RECTAL | Status: DC | PRN

## 2022-02-06 MED ORDER — THIAMINE HCL 100 MG PO TABS
100.0000 mg | ORAL_TABLET | Freq: Every day | ORAL | Status: DC
  Administered 2022-02-07 – 2022-02-20 (×14): 100 mg via ORAL
  Filled 2022-02-06 (×14): qty 1

## 2022-02-06 MED ORDER — DEXAMETHASONE 2 MG PO TABS
2.0000 mg | ORAL_TABLET | Freq: Two times a day (BID) | ORAL | Status: DC
  Administered 2022-02-06 – 2022-02-12 (×12): 2 mg via ORAL
  Filled 2022-02-06 (×15): qty 1

## 2022-02-06 MED ORDER — SENNOSIDES-DOCUSATE SODIUM 8.6-50 MG PO TABS: 1.0000 | ORAL_TABLET | Freq: Every evening | ORAL | Status: DC | PRN

## 2022-02-06 MED ORDER — DEXAMETHASONE 2 MG PO TABS: 2.0000 mg | ORAL_TABLET | Freq: Two times a day (BID) | ORAL | Status: DC

## 2022-02-06 MED ORDER — SODIUM CHLORIDE 0.9% FLUSH
10.0000 mL | Freq: Two times a day (BID) | INTRAVENOUS | Status: DC
  Administered 2022-02-07 – 2022-02-11 (×4): 10 mL

## 2022-02-06 MED ORDER — VITAMIN B-12 1000 MCG PO TABS: 1000.0000 ug | ORAL_TABLET | Freq: Every day | ORAL | Status: DC

## 2022-02-06 MED ORDER — TAMSULOSIN HCL 0.4 MG PO CAPS
0.4000 mg | ORAL_CAPSULE | Freq: Every day | ORAL | Status: DC
  Administered 2022-02-06: 0.4 mg via ORAL
  Filled 2022-02-06: qty 1

## 2022-02-06 MED ORDER — PROCHLORPERAZINE MALEATE 5 MG PO TABS
5.0000 mg | ORAL_TABLET | Freq: Four times a day (QID) | ORAL | Status: DC | PRN
  Administered 2022-02-15: 5 mg via ORAL
  Filled 2022-02-06: qty 1

## 2022-02-06 MED ORDER — CYANOCOBALAMIN 1000 MCG/ML IJ SOLN
1000.0000 ug | Freq: Once | INTRAMUSCULAR | Status: AC
Start: 2022-02-06 — End: 2022-02-06
  Administered 2022-02-06: 1000 ug via INTRAMUSCULAR
  Filled 2022-02-06: qty 1

## 2022-02-06 MED ORDER — ASPIRIN EC 81 MG PO TBEC
81.0000 mg | DELAYED_RELEASE_TABLET | Freq: Every day | ORAL | Status: DC
  Administered 2022-02-07 – 2022-02-20 (×14): 81 mg via ORAL
  Filled 2022-02-06 (×14): qty 1

## 2022-02-06 MED ORDER — LORAZEPAM 2 MG/ML IJ SOLN: 1.0000 mg | INTRAMUSCULAR | Status: AC | PRN

## 2022-02-06 MED ORDER — BISACODYL 10 MG RE SUPP
10.0000 mg | Freq: Every day | RECTAL | Status: DC | PRN
Start: 2022-02-06 — End: 2022-02-20

## 2022-02-06 MED ORDER — SODIUM CHLORIDE 0.9% FLUSH: 10.0000 mL | INTRAVENOUS | Status: DC | PRN

## 2022-02-06 MED ORDER — VITAMIN B-12 1000 MCG PO TABS
1000.0000 ug | ORAL_TABLET | Freq: Every day | ORAL | Status: DC
  Administered 2022-02-07 – 2022-02-20 (×14): 1000 ug via ORAL
  Filled 2022-02-06 (×14): qty 1

## 2022-02-06 MED ORDER — TRAZODONE HCL 50 MG PO TABS
25.0000 mg | ORAL_TABLET | Freq: Every evening | ORAL | Status: DC | PRN
  Administered 2022-02-10 – 2022-02-15 (×3): 50 mg via ORAL
  Filled 2022-02-06 (×4): qty 1

## 2022-02-06 MED ORDER — DIPHENHYDRAMINE HCL 12.5 MG/5ML PO ELIX
12.5000 mg | ORAL_SOLUTION | Freq: Four times a day (QID) | ORAL | Status: DC | PRN
  Administered 2022-02-18: 25 mg via ORAL
  Filled 2022-02-06: qty 10

## 2022-02-06 MED ORDER — ENOXAPARIN SODIUM 40 MG/0.4ML IJ SOSY
40.0000 mg | PREFILLED_SYRINGE | INTRAMUSCULAR | Status: DC
  Administered 2022-02-07 – 2022-02-20 (×14): 40 mg via SUBCUTANEOUS
  Filled 2022-02-06 (×14): qty 0.4

## 2022-02-06 MED ORDER — DIVALPROEX SODIUM 250 MG PO DR TAB
250.0000 mg | DELAYED_RELEASE_TABLET | Freq: Two times a day (BID) | ORAL | Status: DC
  Administered 2022-02-06 – 2022-02-20 (×28): 250 mg via ORAL
  Filled 2022-02-06 (×31): qty 1

## 2022-02-06 MED ORDER — PROCHLORPERAZINE EDISYLATE 10 MG/2ML IJ SOLN: 5.0000 mg | Freq: Four times a day (QID) | INTRAMUSCULAR | Status: DC | PRN

## 2022-02-06 MED ORDER — THIAMINE HCL 100 MG/ML IJ SOLN
100.0000 mg | Freq: Every day | INTRAMUSCULAR | Status: DC
  Filled 2022-02-06 (×12): qty 1

## 2022-02-06 MED ORDER — CHLORHEXIDINE GLUCONATE CLOTH 2 % EX PADS
6.0000 | MEDICATED_PAD | Freq: Every day | CUTANEOUS | Status: DC
  Administered 2022-02-07 – 2022-02-12 (×6): 6 via TOPICAL

## 2022-02-06 MED ORDER — MOMETASONE FURO-FORMOTEROL FUM 100-5 MCG/ACT IN AERO
2.0000 | INHALATION_SPRAY | Freq: Two times a day (BID) | RESPIRATORY_TRACT | Status: DC
  Administered 2022-02-07 – 2022-02-20 (×26): 2 via RESPIRATORY_TRACT
  Filled 2022-02-06: qty 8.8

## 2022-02-06 MED ORDER — ADULT MULTIVITAMIN W/MINERALS CH
1.0000 | ORAL_TABLET | Freq: Every day | ORAL | Status: DC
  Administered 2022-02-07 – 2022-02-20 (×14): 1 via ORAL
  Filled 2022-02-06 (×14): qty 1

## 2022-02-06 MED ORDER — FLEET ENEMA 7-19 GM/118ML RE ENEM: 1.0000 | ENEMA | Freq: Once | RECTAL | Status: DC | PRN

## 2022-02-06 MED ORDER — BETHANECHOL CHLORIDE 10 MG PO TABS: ORAL_TABLET | ORAL | 0 refills | Status: DC

## 2022-02-06 MED ORDER — FOLIC ACID 1 MG PO TABS: 1.0000 mg | ORAL_TABLET | Freq: Every day | ORAL | Status: DC

## 2022-02-06 MED ORDER — GUAIFENESIN-DM 100-10 MG/5ML PO SYRP: 5.0000 mL | ORAL_SOLUTION | Freq: Four times a day (QID) | ORAL | Status: DC | PRN

## 2022-02-06 MED ORDER — LIDOCAINE HCL URETHRAL/MUCOSAL 2 % EX GEL
CUTANEOUS | Status: DC | PRN
  Administered 2022-02-08: 6 via TOPICAL
  Administered 2022-02-08: 1 via TOPICAL
  Administered 2022-02-08 – 2022-02-09 (×3): 6 via TOPICAL
  Filled 2022-02-06 (×8): qty 6

## 2022-02-06 MED ORDER — ACETAMINOPHEN 325 MG PO TABS
325.0000 mg | ORAL_TABLET | ORAL | Status: DC | PRN
  Administered 2022-02-07 – 2022-02-15 (×3): 650 mg via ORAL
  Filled 2022-02-06 (×3): qty 2

## 2022-02-06 MED ORDER — IPRATROPIUM-ALBUTEROL 0.5-2.5 (3) MG/3ML IN SOLN: 3.0000 mL | Freq: Four times a day (QID) | RESPIRATORY_TRACT | Status: DC | PRN

## 2022-02-06 MED ORDER — THIAMINE HCL 100 MG PO TABS: 100.0000 mg | ORAL_TABLET | Freq: Every day | ORAL | Status: DC

## 2022-02-06 MED ORDER — CYANOCOBALAMIN 1000 MCG PO TABS: 1000.0000 ug | ORAL_TABLET | Freq: Every day | ORAL | Status: DC

## 2022-02-06 MED ORDER — POLYETHYLENE GLYCOL 3350 17 G PO PACK
17.0000 g | PACK | Freq: Every day | ORAL | Status: DC | PRN
Start: 2022-02-06 — End: 2022-02-20
  Administered 2022-02-08 – 2022-02-19 (×3): 17 g via ORAL
  Filled 2022-02-06 (×3): qty 1

## 2022-02-06 NOTE — Assessment & Plan Note (Addendum)
Patient dose is down to the floor by nursing take when he got up to use bedside commode.  He attributed this to his right leg.  Orthostatic vitals positive but he denies dizziness. ?-Fall precaution and TED hose as above ?-Encourage oral hydration ?-Elevate head of bed to at least 30 degree ?-Continue PT/OT ?

## 2022-02-06 NOTE — TOC Initial Note (Signed)
Transition of Care (TOC) - Initial/Assessment Note  ? ? ?Patient Details  ?Name: Richard Santos ?MRN: [id number] ?Date of Birth: 04-Mar-1961 ? ?Transition of Care (TOC) CM/SW Contact:    ?Jesselle Laflamme, Marjie Skiff, RN ?Phone Number: ?02/06/2022, 1:19 PM ? ?Clinical Narrative:                 ?Awaiting insurance auth for CIR. TOC will continue to follow for dc planning needs. May need to be faxed out to SNF as back up plan. ? ?  ?  ?Activities of Daily Living ?Home Assistive Devices/Equipment: Gilford Rile (specify type) ?ADL Screening (condition at time of admission) ?Patient's cognitive ability adequate to safely complete daily activities?: Yes ?Is the patient deaf or have difficulty hearing?: No ?Does the patient have difficulty seeing, even when wearing glasses/contacts?: No ?Does the patient have difficulty concentrating, remembering, or making decisions?: No ?Patient able to express need for assistance with ADLs?: Yes ?Does the patient have difficulty dressing or bathing?: No ?Independently performs ADLs?: No ?Communication: Independent ?Dressing (OT): Independent ?Grooming: Independent ?Feeding: Independent ?Bathing: Needs assistance ?Is this a change from baseline?: Change from baseline, expected to last <3 days ?Toileting: Needs assistance ?Is this a change from baseline?: Change from baseline, expected to last <3 days ?In/Out Bed: Needs assistance ?Is this a change from baseline?: Change from baseline, expected to last <3 days ?Walks in Home: Needs assistance ?Is this a change from baseline?: Change from baseline, expected to last <3 days ?Does the patient have difficulty walking or climbing stairs?: Yes ?Weakness of Legs: Both ?Weakness of Arms/Hands: None ? ?Permission Sought/Granted ?  ?  ?   ?   ?   ?   ? ?Emotional Assessment ?  ?  ?  ?  ?  ?  ? ?Admission diagnosis:  Weakness [R53.1] ?Generalized weakness [R53.1] ?Patient Active Problem List  ? Diagnosis Date Noted  ? Hypomagnesemia 02/05/2022  ? Multiple rib fractures  02/05/2022  ? Pneumothorax 02/05/2022  ? Recurrent falls 02/04/2022  ? Pancytopenia ([place]) 02/04/2022  ? Generalized weakness 02/03/2022  ? Alcohol use 02/03/2022  ? 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 Antineoplastic chemotherapy induced anemia 08/04/2021  ? Protein-calorie malnutrition, severe 06/04/2021  ? SIADH (syndrome of inappropriate ADH production) ([place]) 05/23/2021  ? Acute hyponatremia 05/21/2021  ? Hyponatremia 05/20/2021  ? Metastatic cancer to brain [facility]) 09/05/2020  ? Port-A-Cath in place 06/03/2020  ? Emphysema of lung ([place]) 04/26/2020  ? Small cell lung cancer, left upper lobe ([place]) 04/18/2020  ? Encounter for antineoplastic chemotherapy 04/18/2020  ? Encounter for antineoplastic immunotherapy 04/18/2020  ? Goals of care, counseling/discussion 04/18/2020  ? Syncope 04/11/2020  ? Lung mass 04/11/2020  ? History of prostate cancer 04/11/2020  ? Chest pain 04/03/2020  ? Vitamin D deficiency 12/15/2019  ? Dysphagia 07/17/2017  ? HTN (hypertension) 08/20/2016  ? Malignant neoplasm of prostate ([place]) 11/29/2014  ? Bee sting allergy 07/31/2014  ? Elevated PSA 04/14/2013  ? Lumbar disc disease 04/14/2013  ? Personal history of colonic adenoma 04/26/2012  ? Impaired glucose tolerance 02/27/2012  ? Encounter for well adult exam with abnormal findings 02/27/2012  ? Chronic low back pain   ? Alcohol abuse 12/31/2010  ? Functional Dyspepsia 03/11/2009  ? Hyperlipidemia 01/16/2008  ? Anxiety state 01/16/2008  ? ERECTILE DYSFUNCTION 01/16/2008  ? Tobacco abuse 01/16/2008  ? ALLERGIC RHINITIS 01/16/2008  ? GERD 01/16/2008  ? ?PCP:  Curt Bears, MD ?Pharmacy:   ?[id number] - [place], [place] -  [address] ?[place] ?[place] [address] ?Phone: [phone] Fax: [phone] ? ? ? ? ?Social Determinants of Health (SDOH) Interventions ?  ? ?Readmission Risk Interventions ?No flowsheet data found. ? ? ?

## 2022-02-06 NOTE — H&P (Signed)
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 He was admitted on 02/03/22 for work up and MRI brain done revealing significant decrease in size of enhancing lesions (in pons, bilateral temporal lobes, left periventricular white matter and left frontal lobe). Decadron increased to 2 mg IV BID. He was placed on CIWA protocol due to hx of daily ETOH (down from 49 drinks to 3 drinks/day?). CT abdomen/pelvis done revealing new 5-10% left PTX, new acute/subacute right 9-10 rib FX, right foraminal impingement at L5/S1, stable appearance of stable left lung nodule and sclerotic metastatic disease involving thoracic spine and proximal femurs.   Dr. Shirline Frees consulted and recommended maintaining patient on 2 mg bid as he was feeling better. He continues to have RLE weakness with tendency to buckle, needs increased time to process and follow one step commands, has orthostatic changes with standing attempts as well as  right lateral and posterior lean. CIR recommended due to functional decline.    Pt reportts stomach hurts, but no nausea- LBM yesterday?>- pt not sure.  Peed last night, but says hasn't this Am- hasn't had urge.  Ate a lot of breakfast per SLP.     Review of Systems  Constitutional:  Negative for chills and fever.  HENT:  Negative for hearing loss.   Eyes:  Negative for blurred vision and double vision.  Respiratory:  Negative for cough.   Cardiovascular:  Negative for chest pain and palpitations.  Genitourinary:   Negative for dysuria.       Hesitancy. Gets up once a night to urinate.  Musculoskeletal:  Negative for myalgias.  Skin:  Negative for rash.  Neurological:  Positive for dizziness and focal weakness (RLE weakness X 3 weeks). Negative for headaches.  Psychiatric/Behavioral:  The patient is nervous/anxious.   All other systems reviewed and are negative.   Past Medical History:  Diagnosis Date   Allergic rhinitis    Allergy    Anxiety    Chronic low back pain    DDD (degenerative disc disease), lumbar    ED (erectile dysfunction)    Finger injury    cut pads off 3rd and 4th finger left hand/due to lawn mower accident   GERD (gastroesophageal reflux disease)    Heart murmur    as child only-    History of kidney stones    HTN (hypertension) 08/20/2016   Hyperlipidemia    Incomplete right bundle branch block    Prostate cancer (HCC) dx 10/02/14   stage T1c   Sigmoid diverticulosis    Small cell lung cancer (HCC) 03/2020   Wears glasses     Past Surgical History:  Procedure Laterality Date   BRONCHIAL BIOPSY  04/12/2020   Procedure: BRONCHIAL BIOPSIES;  Surgeon: Chilton Greathouse, MD;  Location: [facility];  Service: Cardiopulmonary;;   BRONCHIAL BRUSHINGS  04/12/2020   Procedure: BRONCHIAL BRUSHINGS;  Surgeon: Chilton Greathouse, MD;  Location: [facility];  Service: Cardiopulmonary;;   BRONCHIAL NEEDLE ASPIRATION BIOPSY  04/12/2020   Procedure: BRONCHIAL  NEEDLE ASPIRATION BIOPSIES;  Surgeon: Chilton Greathouse, MD;  Location: [facility];  Service: Cardiopulmonary;;   BRONCHIAL WASHINGS  04/12/2020   Procedure: BRONCHIAL WASHINGS;  Surgeon: Chilton Greathouse, MD;  Location: [facility];  Service: Cardiopulmonary;;   COLONOSCOPY     COLONOSCOPY W/ POLYPECTOMY  04-19-2012   ENDOBRONCHIAL ULTRASOUND N/A 04/12/2020   Procedure: ENDOBRONCHIAL ULTRASOUND;  Surgeon: Chilton Greathouse, MD;  Location: [facility];  Service: Cardiopulmonary;  Laterality: N/A;   ESOPHAGOGASTRODUODENOSCOPY   08-05-2010   HEMOSTASIS CONTROL  04/12/2020   Procedure: HEMOSTASIS CONTROL;  Surgeon: Chilton Greathouse, MD;  Location: [facility];  Service: Cardiopulmonary;;   IR IMAGING GUIDED PORT INSERTION  05/31/2020   POLYPECTOMY     PROSTATE BIOPSY  10/02/14   RADIOACTIVE SEED IMPLANT N/A 01/30/2015   Procedure: RADIOACTIVE SEED IMPLANT;  Surgeon: Barron Alvine, MD;  Location[facility];  Service: Urology;  Laterality: N/A;   UPPER GASTROINTESTINAL ENDOSCOPY  04/03/2021   VIDEO BRONCHOSCOPY N/A 04/12/2020   Procedure: VIDEO BRONCHOSCOPY WITHOUT FLUORO;  Surgeon: Chilton Greathouse, MD;  Location: [facility];  Service: Cardiopulmonary;  Laterality: N/A;   Family History  Problem Relation Age of Onset   Prostate cancer Father    Heart disease Father    Heart disease Mother    Breast cancer Sister    Heart disease Other    Colon cancer Neg Hx    Colon polyps Neg Hx    Esophageal cancer Neg Hx    Rectal cancer Neg Hx    Stomach cancer Neg Hx     Social History:  reports that he has been smoking cigarettes. He has a 3.75 pack-year smoking history. He has never used smokeless tobacco. He reports current alcohol use of about 49.0 standard drinks per week. He reports that he does not use drugs.   Allergies  Allergen Reactions   Bee Venom Swelling   Elemental Sulfur Other (See Comments)    Acute renal failure   Cosentyx [Secukinumab] Swelling   Other Other (See Comments)    Chemo medicine- patient not sure about name - swelling & rashes   Sulfa Antibiotics Other (See Comments)    Pt does not remember reaction    Namenda [Memantine] Nausea Only    Medications Prior to Admission  Medication Sig Dispense Refill   aspirin EC 81 MG tablet Take 81 mg by mouth daily. Swallow whole.     Budeson-Glycopyrrol-Formoterol (BREZTRI AEROSPHERE) 160-9-4.8 MCG/ACT AERO Inhale 2 puffs into the lungs in the morning and at bedtime. 10.7 g 11   busPIRone (BUSPAR) 10 MG tablet TAKE 1 TABLET BY  MOUTH TWICE A DAY (Patient taking differently: Take 10 mg by mouth 2 (two) times daily.) 180 tablet 2   Camphor-Menthol-Methyl Sal (SALONPAS EX) Apply 1 patch topically daily as needed (pain).     dexamethasone (DECADRON) 1 MG tablet Take 2 tablets (2 mg total) by mouth daily with breakfast for 7 days, THEN 1 tablet (1 mg total) daily with breakfast for 7 days. 21 tablet 0   divalproex (DEPAKOTE) 250 MG DR tablet Take 1 tablet (250 mg total) by mouth 2 (two) times daily. 60 tablet 2   ibuprofen (ADVIL) 200 MG tablet Take 400 mg by mouth daily as needed for headache or mild pain.     lovastatin (MEVACOR) 20 MG tablet Take 1 tablet (20 mg total) by mouth at bedtime. 90 tablet 3   Multiple Vitamin (MULTIVITAMIN) capsule Take 1 capsule by mouth daily.     ondansetron (ZOFRAN-ODT)  8 MG disintegrating tablet TAKE 1 TABLET BY MOUTH EVERY 8 HOURS AS NEEDED FOR NAUSEA AND VOMITING (Patient taking differently: Take 8 mg by mouth every 8 (eight) hours as needed for vomiting or nausea.) 18 tablet 4   pantoprazole (PROTONIX) 40 MG tablet Take 40 mg by mouth every morning.     prochlorperazine (COMPAZINE) 10 MG tablet TAKE 1 TABLET BY MOUTH EVERY 6 HOURS AS NEEDED (Patient taking differently: Take 10 mg by mouth every 6 (six) hours as needed for nausea or vomiting.) 30 tablet 2   sodium bicarbonate 650 MG tablet TAKE 1 TABLET BY MOUTH TWICE A DAY (Patient taking differently: Take 650 mg by mouth 2 (two) times daily.) 180 tablet 1   traMADol (ULTRAM) 50 MG tablet TAKE 1/2 TABLET BY MOUTH TWICE A DAY AS NEEDED FOR PAIN (Patient taking differently: Take 25 mg by mouth daily as needed for moderate pain.) 60 tablet 2   famotidine (PEPCID) 20 MG tablet Take 1 tablet (20 mg total) by mouth 2 (two) times daily. (Patient not taking: Reported on 02/04/2022) 60 tablet 2      Home: Home Living Family/patient expects to be discharged to:: Private residence Living Arrangements: Spouse/significant other Available Help at  Discharge: Family, Available 24 hours/day Type of Home: House Home Access: Stairs to enter [employer] of Steps: 3 Entrance Stairs-Rails: Right Home Layout: Two level, Able to live on main level with bedroom/bathroom Alternate Level Stairs-Number of Steps: 12 Bathroom Shower/Tub: Artist: Rollator (4 wheels), Cane - quad, BSC/3in1   Functional History: Prior Function Prior Level of Function : Independent/Modified Independent Mobility Comments: patient reported using rollator at home. patient reported that rollator is quick to get away from him ADLs Comments: pt states he sometimes get help with dressing, bathing from his spouse depending on how he is feeling  Functional Status:  Mobility: Bed Mobility Overal bed mobility: Needs Assistance Bed Mobility: Supine to Sit Rolling: Min assist Sidelying to sit: Min assist, HOB elevated Supine to sit: Supervision General bed mobility comments: increased time, VCs for technique Transfers Overall transfer level: Needs assistance Equipment used: Rolling walker (2 wheels) Transfers: Sit to/from Stand, Bed to chair/wheelchair/BSC Sit to Stand: Min guard Bed to/from chair/wheelchair/BSC transfer type:: Via Financial planner via Lift Equipment: [employer] transfer comment: sit to stand x 6 trials with VCs for hand placement. Used Stedy to transfer to 3 in 1 then to recliner. Pt performed standing mini squats and marching in [place] wtih frequent buckling of RLE. Ambulation/Gait Ambulation/Gait assistance: Mod assist Gait Distance (Feet): 30 Feet (seated rest x2 minutes, 30 ft back to room) Assistive device: Rolling walker (2 wheels) Gait Pattern/deviations: Step-through pattern, Decreased stride length, Ataxic, Shuffle, Drifts right/left, Trunk flexed, Narrow base of support, Knees buckling General Gait Details: deferred 2* frequent buckling of RLE in standing, requested order for knee immobilizer from Dr  Alanda Slim Gait velocity: decr    ADL: ADL Overall ADL's : Needs assistance/impaired Eating/Feeding: Set up, Sitting Grooming: Wash/dry face, Wash/dry hands, Sitting Grooming Details (indicate cue type and reason): EOB Upper Body Bathing: Minimal assistance, Sitting Lower Body Bathing: Moderate assistance, Sit to/from stand, Sitting/lateral leans Upper Body Dressing : Set up, Sitting Lower Body Dressing: Moderate assistance, Sit to/from stand, Sitting/lateral leans Lower Body Dressing Details (indicate cue type and reason): noted to have shakiness in BLE with seocnd attempt at standing with inability to take BUE off walker to assist with clothing managemnet Toilet Transfer: Total assistance, Moderate assistance, +2 for  physical assistance, +2 for safety/equipment, BSC/3in1 Toilet Transfer Details (indicate cue type and reason): Patient needing mod +2 to power up to standing due to R LE buckling and cues for hand placement. Utilized Corene Cornea to complete transfer first to bedside commode then to recliner chair. Toileting- Clothing Manipulation and Hygiene: Total assistance, Sit to/from stand Toileting - Clothing Manipulation Details (indicate cue type and reason): Patient heavily reliant on UE support with posterior lean and R LE buckling in standing. Total A for perianal care after bowel movement  Cognition: Cognition Overall Cognitive Status: Impaired/Different from baseline Orientation Level: Oriented to person, Oriented to place, Oriented to time, Disoriented to situation Cognition Arousal/Alertness: Awake/alert Behavior During Therapy: WFL for tasks assessed/performed Overall Cognitive Status: Impaired/Different from baseline Area of Impairment: Safety/judgement, Problem solving, Following commands Current Attention Level: Sustained Following Commands: Follows one step commands with increased time, Follows multi-step commands inconsistently Safety/Judgement: Decreased awareness of  deficits, Decreased awareness of safety Problem Solving: Slow processing, Decreased initiation, Requires verbal cues, Requires tactile cues General Comments: Patient needing directions restated for accurate follow through   Blood pressure 111/86, pulse 93, temperature 97.6 F (36.4 C), temperature source Oral, resp. rate 18, height 6' (1.829 m), weight 59 kg, SpO2 100 %. Physical Exam Vitals and nursing note reviewed. Exam conducted with a chaperone present.  Constitutional:      Comments: Thin male. NAD Sitting up in bed with SLP in room; vague and not sure about answers, NAD  HENT:     Head: Normocephalic and atraumatic.     Right Ear: External ear normal.     Left Ear: External ear normal.     Nose: Nose normal. No congestion.     Mouth/Throat:     Mouth: Mucous membranes are dry.     Pharynx: Oropharynx is clear. No oropharyngeal exudate.  Eyes:     General:        Right eye: No discharge.        Left eye: No discharge.     Conjunctiva/sclera: Conjunctivae normal.  Cardiovascular:     Rate and Rhythm: Normal rate and regular rhythm.     Heart sounds: Normal heart sounds. No murmur heard.   No gallop.  Pulmonary:     Effort: Pulmonary effort is normal. No respiratory distress.     Breath sounds: Normal breath sounds. No wheezing, rhonchi or rales.  Abdominal:     General: Bowel sounds are normal. There is no distension.     Palpations: Abdomen is soft.     Tenderness: There is no abdominal tenderness.  Musculoskeletal:     Cervical back: Neck supple. No tenderness.     Comments: Moving all extremities B/L- but obviously weak  Skin:    General: Skin is warm and dry.     Comments: Scab on right knee and healing abrasions left knee.  Port that's accessed in R chest;    Neurological:     Mental Status: He is alert.     Comments: Slow measured speech. Mild right facial weakness. Occasional intentional tremor RUE. RLE weakness.   Poor memory- got 11/30 on SLUMs    Psychiatric:     Comments: Flat-- poor memory    Results for orders placed or performed during the hospital encounter of 02/03/22 (from the past 48 hour(s))  CBC     Status: Abnormal   Collection Time: 02/05/22  5:14 AM  Result Value Ref Range   WBC 3.4 (L) 4.0 - 10.5 K/uL  RBC 3.02 (L) 4.22 - 5.81 MIL/uL   Hemoglobin 11.1 (L) 13.0 - 17.0 g/dL   HCT 16.1 (L) 09.6 - 04.5 %   MCV 104.0 (H) 80.0 - 100.0 fL   MCH 36.8 (H) 26.0 - 34.0 pg   MCHC 35.4 30.0 - 36.0 g/dL   RDW 40.9 81.1 - 91.4 %   Platelets 140 (L) 150 - 400 K/uL   nRBC 0.0 0.0 - 0.2 %    Comment: Performed at [facility], [address], [place], [place] [address]  Renal function panel     Status: Abnormal   Collection Time: 02/05/22  5:14 AM  Result Value Ref Range   Sodium 130 (L) 135 - 145 mmol/L   Potassium 4.2 3.5 - 5.1 mmol/L   Chloride 97 (L) 98 - 111 mmol/L   CO2 25 22 - 32 mmol/L   Glucose, Bld 109 (H) 70 - 99 mg/dL    Comment: Glucose reference range applies only to samples taken after fasting for at least 8 hours.   BUN 22 (H) 6 - 20 mg/dL   Creatinine, Ser 6.21 0.61 - 1.24 mg/dL   Calcium 9.1 8.9 - 30.8 mg/dL   Phosphorus 4.2 2.5 - 4.6 mg/dL   Albumin 3.5 3.5 - 5.0 g/dL   GFR, Estimated >65 >78 mL/min    Comment: (NOTE) Calculated using the CKD-EPI Creatinine Equation (2021)    Anion gap 8 5 - 15    Comment: Performed at [facility], [address], [place], [place] [address]  Magnesium     Status: Abnormal   Collection Time: 02/05/22  5:14 AM  Result Value Ref Range   Magnesium 1.5 (L) 1.7 - 2.4 mg/dL    Comment: Performed at [facility], [address], [place], [place] [address]  Renal function panel     Status: Abnormal   Collection Time: 02/06/22  4:57 AM  Result Value Ref Range   Sodium 132 (L) 135 - 145 mmol/L   Potassium 3.8 3.5 - 5.1 mmol/L   Chloride 98 98 - 111 mmol/L   CO2 26 22 - 32 mmol/L   Glucose, Bld 102 (H) 70 - 99  mg/dL    Comment: Glucose reference range applies only to samples taken after fasting for at least 8 hours.   BUN 24 (H) 6 - 20 mg/dL   Creatinine, Ser 1.32 0.61 - 1.24 mg/dL   Calcium 8.6 (L) 8.9 - 10.3 mg/dL   Phosphorus 4.0 2.5 - 4.6 mg/dL   Albumin 3.3 (L) 3.5 - 5.0 g/dL   GFR, Estimated >44 >01 mL/min    Comment: (NOTE) Calculated using the CKD-EPI Creatinine Equation (2021)    Anion gap 8 5 - 15    Comment: Performed at [facility], [address], [place], [place] [address]  Magnesium     Status: None   Collection Time: 02/06/22  4:57 AM  Result Value Ref Range   Magnesium 1.7 1.7 - 2.4 mg/dL    Comment: Performed at [facility], [address] [place], [place] [address]  CBC     Status: Abnormal   Collection Time: 02/06/22  4:57 AM  Result Value Ref Range   WBC 3.9 (L) 4.0 - 10.5 K/uL   RBC 2.78 (L) 4.22 - 5.81 MIL/uL   Hemoglobin 10.2 (L) 13.0 - 17.0 g/dL   HCT 03.4 (L) 74.2 - 59.5 %   MCV 104.7 (H) 80.0 - 100.0 fL  MCH 36.7 (H) 26.0 - 34.0 pg   MCHC 35.1 30.0 - 36.0 g/dL   RDW 16.1 09.6 - 04.5 %   Platelets 152 150 - 400 K/uL   nRBC 0.0 0.0 - 0.2 %    Comment: Performed at [facility], [address] [place], [place] [address]  CK     Status: Abnormal   Collection Time: 02/06/22  4:57 AM  Result Value Ref Range   Total CK 26 (L) 49 - 397 U/L    Comment: Performed at [facility], [address] [place], [place] [address]  Vitamin B12     Status: None   Collection Time: 02/06/22  4:57 AM  Result Value Ref Range   Vitamin B-12 218 180 - 914 pg/mL    Comment: (NOTE) This assay is not validated for testing neonatal or myeloproliferative syndrome specimens for Vitamin B12 levels. Performed at [facility], [address], [place], [place] [address]   Folate     Status: None   Collection Time: 02/06/22  4:57 AM  Result Value Ref Range   Folate 14.0 >5.9 ng/mL     Comment: Performed at [facility], [address], [place], [place] [address]  Iron and TIBC     Status: Abnormal   Collection Time: 02/06/22  4:57 AM  Result Value Ref Range   Iron 124 45 - 182 ug/dL   TIBC 657 846 - 962 ug/dL   Saturation Ratios 42 (H) 17.9 - 39.5 %   UIBC 171 ug/dL    Comment: Performed at [facility], [address], [place], [place] [address]  Ferritin     Status: Abnormal   Collection Time: 02/06/22  4:57 AM  Result Value Ref Range   Ferritin 812 (H) 24 - 336 ng/mL    Comment: Performed at [facility], [address], [place], [place] [address]  Reticulocytes     Status: Abnormal   Collection Time: 02/06/22  4:57 AM  Result Value Ref Range   Retic Ct Pct 2.5 0.4 - 3.1 %   RBC. 2.77 (L) 4.22 - 5.81 MIL/uL   Retic Count, Absolute 68.4 19.0 - 186.0 K/uL   Immature Retic Fract 24.9 (H) 2.3 - 15.9 %    Comment: Performed at [facility], [address], [place], [place] [address]   DG Chest 2 View  Result Date: 02/05/2022 CLINICAL DATA:  Lung cancer, hypertension, prostate cancer EXAM: CHEST - 2 VIEW COMPARISON:  04/12/2020 chest radiograph.  02/04/2022 chest CT. FINDINGS: Right internal jugular Port-A-Cath terminates in the lower third of the SVC. Stable cardiomediastinal silhouette with normal heart size. No pneumothorax. No pleural effusion. Stable masslike upper left perihilar consolidation with associated volume loss and distortion. No acute consolidative airspace disease. IMPRESSION: Stable masslike upper left perihilar consolidation with associated volume loss and distortion, compatible with post treatment changes delineated on chest CT study from 1 day prior. Otherwise no active disease. No evidence of pneumothorax on today's radiographs. Electronically Signed   By: Delbert Phenix M.D.   On: 02/05/2022 10:38   CT CHEST W CONTRAST  Result Date: 02/04/2022 CLINICAL DATA:  Staging of small  cell lung cancer. * Tracking Code: BO * EXAM: CT CHEST, ABDOMEN, AND PELVIS WITH CONTRAST TECHNIQUE: Multidetector CT imaging of the chest, abdomen and pelvis was performed following the standard protocol during bolus administration of intravenous contrast. RADIATION DOSE REDUCTION: This exam was performed according  to the departmental dose-optimization program which includes automated exposure control, adjustment of the mA and/or kV according to patient size and/or use of iterative reconstruction technique. CONTRAST:  OMNIPAQUE IOHEXOL 300 MG/ML  SOLN COMPARISON:  12/19/2021 FINDINGS: CT CHEST FINDINGS Cardiovascular: Right Port-A-Cath tip: SVC. Coronary, aortic arch, and branch vessel atherosclerotic vascular disease. Mediastinum/Nodes: Small left pneumothorax, equivocal trace pneumomediastinum for example on image 89 of series 508 although much of the appearance is probably from the 5-10% left pneumothorax. No pathologic adenopathy identified. Lungs/Pleura: Biapical pleuroparenchymal scarring. 5-10% left pneumothorax anteriorly, new compared to the 12/19/2021 exam. Mild emphysema. Substantial left perihilar and paramediastinal volume loss and consolidation on the left primarily in the suprahilar region, compatible with prior radiation therapy. There is some localized nodularity in the left upper lobe just above a cavitary lesion, this nodule measures 1.1 by 0.7 cm on image 38 series 508, stable. The cavitary lesion measures about 2.5 by 1.7 cm on image 44 series 508, formerly 2.7 by 1.6 cm. Musculoskeletal: Healing right anterolateral rib fractures noted. Newly apparent fractures of the right anterior ninth and tenth ribs. Old left anterior second rib fracture, healed. No new left rib fractures are identified. There is a sclerotic lesion in the upper sternal body on image 86 series 507 which is not substantially changed from the prior exam Other sclerotic lesions in the thoracic spine compatible with prior  metastatic lesions appear similar to previous including right pedicle and posterior element involvement by sclerotic metastatic lesions in the sixth and seventh thoracic vertebra and in the left eighth thoracic vertebra posterior elements. Small sclerotic lesion centrally in the T8 vertebral body noted. CT ABDOMEN PELVIS FINDINGS Hepatobiliary: Contracted gallbladder.  Otherwise unremarkable. Pancreas: Unremarkable Spleen: Unremarkable Adrenals/Urinary Tract: 2.6 by 2.1 cm cyst of the right mid kidney anteriorly on image 75 series 504. 4 mm nonobstructive left kidney upper pole calculus, image 90 series 505. Adrenal glands unremarkable. Stomach/Bowel: Mild sigmoid colon diverticulosis. Vascular/Lymphatic: Atherosclerosis is present, including aortoiliac atherosclerotic disease. Reproductive: Brachytherapy seed implants in the prostate gland. Other: Trace amount of gas in the left anterior abdominal wall subcutaneous tissues on image 94 series 504, possibly injection related. Musculoskeletal: Stable 1.2 cm sclerotic lesion in the right proximal femur on image 135 series 504. Stable 1.4 cm sclerotic lesion in the left femoral head, image 123 series 504. No new bony lesions. There is evidence of spondylosis and degenerative disc disease at the L5-S1 level contributing to right foraminal impingement. IMPRESSION: 1. New 5-10% left-sided pneumothorax. 2. New acute/subacute fractures of the right anterior ninth and tenth ribs. Additional right-sided ribs demonstrate healing response in previously identified rib fractures. I do not see a definite acute left rib fracture. 3. Stable appearance of post radiation therapy related paramediastinal and perihilar scarring on the left. Stable appearance of nodule and adjacent cavitary lesion at the left lung apex. 4. Stable appearance of sclerotic metastatic disease involving the thoracic spine and proximal femurs. 5. Other imaging findings of potential clinical significance: Aortic  Atherosclerosis (ICD10-I70.0). Coronary atherosclerosis. Nonobstructive left nephrolithiasis. Sigmoid colon diverticulosis. Right foraminal impingement at L5-S1. Critical Value/emergent results were called by telephone at the time of interpretation on 02/04/2022 at 8:43 pm to provider [facility] , who verbally acknowledged these results. Electronically Signed   By: Gaylyn Rong M.D.   On: 02/04/2022 20:52   MR BRAIN W WO CONTRAST  Result Date: 02/05/2022 CLINICAL DATA:  Metastatic lung cancer, assess treatment response EXAM: MRI HEAD WITHOUT AND WITH CONTRAST TECHNIQUE: Multiplanar, multiecho  pulse sequences of the brain and surrounding structures were obtained without and with intravenous contrast. CONTRAST:  6mL GADAVIST GADOBUTROL 1 MMOL/ML IV SOLN COMPARISON:  12/18/2021 FINDINGS: Brain: Significant interval decrease in the size of multiple enhancing lesions in the bilateral cerebral and cerebellar hemispheres. 2 mm posterior right frontal lobe lesion (16:21), previously 6 mm. 6 mm right anterior parietal lobe lesion (16:38), previously 11 mm. 6 mm medial right parietal lobe lesion (16:40), previously 10 mm. 4 mm left frontal lobe lesions (16:43), previously 9 mm and 12 mm. 3 mm left parietal lesion (1647), previously 5 mm. 6 mm left frontal lobe lesion (16:71), previously 12 mm. 4 mm left temporal lesion (16:74) previously 10 mm. 4 mm left thalamic lesion (16:86), previously 9 mm. 8 mm left frontal lesion (16:87), previously 13 mm. 5 mm medial inferior left frontal lesion (16:92), previously 8 mm. 6 mm right mesial temporal lesion (16:94), previously up to 25 mm. 5 mm ill-defined enhancement in the left mesial temporal lobe (16:92), previously 16 mm. 7 mm left temporal lesion (16:105), previously 16 mm. 10 mm medial right temporal lesion (16:113), previously up to 23 mm. 2 mm posterior right temporal lesion (16:106), previously 5 mm. 7 mm posterior right temporal lesion (16:98), previously 11 mm.  Enhancing lesions in the pons are now all sub 5 mm, with a previously noted 11 mm lesion now measuring approximately 5 mm (16:118). No enhancing lesions remain in the cerebellum. No definite new lesions are seen. Foci of restricted diffusion in the pons, bilateral temporal lobes, left periventricular white matter, and left frontal lobe (series 5, images 76, 77, 60, 58) appear to correlate with treated lesions and are not felt to reflect acute infarcts. No acute hemorrhage. Multiple foci of hemosiderin deposition are seen in the location of treated lesions. No mass effect or midline shift. Decreased edema at the site of the previous lesions. Confluent T2 hyperintense signal in the periventricular white matter, likely the sequela of a combination of chronic small vessel ischemic disease and treatment related effects. Cerebral volume loss is advanced for age. No hydrocephalus or extra-axial collection. Vascular: Normal flow voids. Skull and upper cervical spine: Normal marrow signal. Sinuses/Orbits: Small mucous retention cyst in the left maxillary sinus. The paranasal sinuses are otherwise clear. The orbits are unremarkable. Other: Fluid in the right-greater-than-left mastoid air cells. IMPRESSION: 1. Significant interval decrease in the size of previously noted enhancing lesions in the brain. No new lesions are seen. 2. No other acute intracranial process. Foci of restricted diffusion appear correlate with treated lesions and are not felt to reflect acute infarcts. Electronically Signed   By: Wiliam Ke M.D.   On: 02/05/2022 01:13   CT ABDOMEN PELVIS W CONTRAST  Result Date: 02/04/2022 CLINICAL DATA:  Staging of small cell lung cancer. * Tracking Code: BO * EXAM: CT CHEST, ABDOMEN, AND PELVIS WITH CONTRAST TECHNIQUE: Multidetector CT imaging of the chest, abdomen and pelvis was performed following the standard protocol during bolus administration of intravenous contrast. RADIATION DOSE REDUCTION: This exam was  performed according to the departmental dose-optimization program which includes automated exposure control, adjustment of the mA and/or kV according to patient size and/or use of iterative reconstruction technique. CONTRAST:  OMNIPAQUE IOHEXOL 300 MG/ML  SOLN COMPARISON:  12/19/2021 FINDINGS: CT CHEST FINDINGS Cardiovascular: Right Port-A-Cath tip: SVC. Coronary, aortic arch, and branch vessel atherosclerotic vascular disease. Mediastinum/Nodes: Small left pneumothorax, equivocal trace pneumomediastinum for example on image 89 of series 508 although much of the appearance is  probably from the 5-10% left pneumothorax. No pathologic adenopathy identified. Lungs/Pleura: Biapical pleuroparenchymal scarring. 5-10% left pneumothorax anteriorly, new compared to the 12/19/2021 exam. Mild emphysema. Substantial left perihilar and paramediastinal volume loss and consolidation on the left primarily in the suprahilar region, compatible with prior radiation therapy. There is some localized nodularity in the left upper lobe just above a cavitary lesion, this nodule measures 1.1 by 0.7 cm on image 38 series 508, stable. The cavitary lesion measures about 2.5 by 1.7 cm on image 44 series 508, formerly 2.7 by 1.6 cm. Musculoskeletal: Healing right anterolateral rib fractures noted. Newly apparent fractures of the right anterior ninth and tenth ribs. Old left anterior second rib fracture, healed. No new left rib fractures are identified. There is a sclerotic lesion in the upper sternal body on image 86 series 507 which is not substantially changed from the prior exam Other sclerotic lesions in the thoracic spine compatible with prior metastatic lesions appear similar to previous including right pedicle and posterior element involvement by sclerotic metastatic lesions in the sixth and seventh thoracic vertebra and in the left eighth thoracic vertebra posterior elements. Small sclerotic lesion centrally in the T8 vertebral body  noted. CT ABDOMEN PELVIS FINDINGS Hepatobiliary: Contracted gallbladder.  Otherwise unremarkable. Pancreas: Unremarkable Spleen: Unremarkable Adrenals/Urinary Tract: 2.6 by 2.1 cm cyst of the right mid kidney anteriorly on image 75 series 504. 4 mm nonobstructive left kidney upper pole calculus, image 90 series 505. Adrenal glands unremarkable. Stomach/Bowel: Mild sigmoid colon diverticulosis. Vascular/Lymphatic: Atherosclerosis is present, including aortoiliac atherosclerotic disease. Reproductive: Brachytherapy seed implants in the prostate gland. Other: Trace amount of gas in the left anterior abdominal wall subcutaneous tissues on image 94 series 504, possibly injection related. Musculoskeletal: Stable 1.2 cm sclerotic lesion in the right proximal femur on image 135 series 504. Stable 1.4 cm sclerotic lesion in the left femoral head, image 123 series 504. No new bony lesions. There is evidence of spondylosis and degenerative disc disease at the L5-S1 level contributing to right foraminal impingement. IMPRESSION: 1. New 5-10% left-sided pneumothorax. 2. New acute/subacute fractures of the right anterior ninth and tenth ribs. Additional right-sided ribs demonstrate healing response in previously identified rib fractures. I do not see a definite acute left rib fracture. 3. Stable appearance of post radiation therapy related paramediastinal and perihilar scarring on the left. Stable appearance of nodule and adjacent cavitary lesion at the left lung apex. 4. Stable appearance of sclerotic metastatic disease involving the thoracic spine and proximal femurs. 5. Other imaging findings of potential clinical significance: Aortic Atherosclerosis (ICD10-I70.0). Coronary atherosclerosis. Nonobstructive left nephrolithiasis. Sigmoid colon diverticulosis. Right foraminal impingement at L5-S1. Critical Value/emergent results were called by telephone at the time of interpretation on 02/04/2022 at 8:43 pm to provider [facility] , who verbally acknowledged these results. Electronically Signed   By: Gaylyn Rong M.D.   On: 02/04/2022 20:52   DG Knee AP/LAT W/Sunrise Right  Result Date: 02/06/2022 CLINICAL DATA:  Pain.  Abrasion patella after fall at home. EXAM: RIGHT KNEE 3 VIEWS COMPARISON:  None. FINDINGS: No evidence of fracture, dislocation, or joint effusion. No evidence of arthropathy or other focal bone abnormality. Soft tissues are unremarkable. Vascular calcifications are noted. IMPRESSION: No acute abnormality. Electronically Signed   By: Neita Garnet M.D.   On: 02/06/2022 13:41      Blood pressure 111/86, pulse 93, temperature 97.6 F (36.4 C), temperature source Oral, resp. rate 18, height 6' (1.829 m), weight 59 kg, SpO2 100 %.  Medical  Problem List and Plan: 1. Functional deficits secondary to debility from metastatic Cancer  -patient may not shower- due to R chest port  -ELOS/Goals: 2-3 weeks- min A to supervision 2.  Antithrombotics: -DVT/anticoagulation:  Pharmaceutical: Lovenox  -antiplatelet therapy: Low dose ASA.  3. Pain Management: Continue Tylenol prn.  4. Mood: LCSW to follow for evaluation and support.   -antipsychotic agents: N/A 5. Neuropsych: This patient may not be fully  capable of making decisions on his own behalf.SLUMs 11/30 6. Skin/Wound Care: routine pressure relief measures.  7. Fluids/Electrolytes/Nutrition: Monitor I/O. Check lytes in am. 8.  Recurrent brain mets: On Depakote for seizure prophylaxis  --continue decadron 2 mg BID 9. Orthostatic hypotension: Continue to check orthostatic vitals.   --TEDs and abdominal binder when out of bed.  --encourage fluid intake. 10. Lung Cancer/COPD: Continue Dulera with incurse.  11. Suboptimal  Vitamin B12: Level-218-->received IV doses X 3-->po route daily.  12. Hypomagnesemia: Recheck Mg level in am. Improved from 1.5-->1.7 post supplement.  13. Urinary retention?: Was cathed prior to d/c from [facility] today. --Will  monitor voiding with PVR checks. Cath for volumes > 350 cc.  14. Pancytopenia: Recheck CBC in am.       I have personally performed a face to face diagnostic evaluation of this patient and formulated the key components of the plan.  Additionally, I have personally reviewed laboratory data, imaging studies, as well as relevant notes and concur with the physician assistant's documentation above.   The patient's status has not changed from the original H&P.  Any changes in documentation from the acute care chart have been noted above.     Jacquelynn Cree, PA-C 02/06/2022

## 2022-02-06 NOTE — Progress Notes (Signed)
Inpatient Rehab Admissions Coordinator:   ? ?I do not yet have insurance auth or a CIR bed for this Pt. Will follow for potential admit pending insurance auth.  ? ?Clemens Catholic, MS, CCC-SLP ?Rehab Admissions Coordinator  ?[phone] (celll) ?[phone] (office) ? ?

## 2022-02-06 NOTE — Progress Notes (Signed)
PMR Admission Coordinator Pre-Admission Assessment ?  ?Patient: Richard Santos is an 61 y.o., male ?MRN: [id number] ?DOB: 31-Mar-1961 ?Height: 6' (182.9 cm) ?Weight: 59 kg ?  ?Insurance Information ?HMO: Yes    PPO:      PCP:      IPA:      80/20:      OTHER:  ?PRIMARY: [employer]      Policy#: [id number]      Subscriber: patient ?CM Name: n/a       Phone#: [phone]     Fax#: [phone] ?Pre-Cert#: [id number]    Representative authorization for 3/17, with daily updates due via fax  ?  Employer: Disabled ?Benefits:  Phone #: [phone]     Name:  ?Eff. Date: 11/23/21     Deduct: $0      Out of Pocket Max: $7250 (met [id number])      Life Max: N/A ?CIR: 50% coverage      SNF: 50% coverage, limited to 60 days/year ?Outpatient: 85% coverage, limited to 30 visits combined     Co-Pay: 15% ?Home Health: 50% limited to 120 visits/year    Co-Pay: 50% ?DME: 50%     Co-Pay: 50% ?Providers: in network ? ?SECONDARY:       Policy#:      Phone#:  ?  ?Financial Counselor:       Phone#:  ?  ?The ?Data Collection Information Summary? for patients in Inpatient Rehabilitation Facilities with attached ?Privacy Act [place] Records? was provided and verbally reviewed with: N/A ?  ?Emergency Contact Information ?Contact Information   ?  ?  Name Relation Home Work Mobile  ?  Makaveli, Hoard Spouse [phone]   [phone]  ?  ?   ?  ?  ?Current Medical History  ?Patient Admitting Diagnosis: Debility ?  ?History of Present Illness: A 61 y.o. 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  He is followed by Dr. Mickeal Skinner for brain metastasis in the recently had whole brain radiation last month.  He has been on Decadron since the radiation and dose is being tapered.  He went from 2 mg a day to 1 mg a day a week ago.  For the last 2 days he has had increased weakness and  decreased ability to walk with increased weakness in the right leg and he has been more unsteady and dragging his right leg.  He has had multiple falls over the last few days.  Wife states he is fallen 4 times today.  No loss of consciousness.  He does report he hit his head once on the floor but was very light and has no residual headache and no injury from the falls.  He has not had any fever, cough, nausea vomiting or diarrhea.  PT/OT evaluations completed with recommendations for post acute rehab admission. ?  ?Patient's medical record from [facility] has been reviewed by the rehabilitation admission coordinator and physician. ?  ?Past Medical History  ?    ?Past Medical History:  ?Diagnosis Date  ? Allergic rhinitis    ? Allergy    ? Anxiety    ? Chronic low back pain    ? DDD (degenerative disc disease), lumbar    ? ED (erectile dysfunction)    ? Finger injury    ?  cut pads off 3rd and 4th finger left hand/due to lawn  mower accident  ? GERD (gastroesophageal reflux disease)    ? Heart murmur    ?  as child only-   ? History of kidney stones    ? HTN (hypertension) 08/20/2016  ? Hyperlipidemia    ? Incomplete right bundle branch block    ? Prostate cancer [facility]) dx 10/02/14  ?  stage T1c  ? Sigmoid diverticulosis    ? Small cell lung cancer ([place]) 03/2020  ? Wears glasses    ?  ?  ?Has the patient had major surgery during 100 days prior to admission? No ?  ?Family History   ?family history includes Breast cancer in his sister; Heart disease in his father, mother, and another family member; Prostate cancer in his father. ?  ?Current Medications ?  ?Current Facility-Administered Medications:  ?  acetaminophen (TYLENOL) tablet 650 mg, 650 mg, Oral, Q6H PRN, 650 mg at 02/04/22 2113 **OR** acetaminophen (TYLENOL) suppository 650 mg, 650 mg, Rectal, Q6H PRN, Chotiner, Yevonne Aline, MD ?  aspirin EC tablet 81 mg, 81 mg, Oral, Daily, Chotiner, Yevonne Aline, MD, 81 mg at 02/05/22 1046 ?  Chlorhexidine Gluconate Cloth  2 % PADS 6 each, 6 each, Topical, Daily, Mercy Riding, MD, 6 each at 02/05/22 1046 ?  dexamethasone (DECADRON) tablet 2 mg, 2 mg, Oral, Q12H, Gonfa, Taye T, MD ?  divalproex (DEPAKOTE) DR tablet 250 mg, 250 mg, Oral, BID, Chotiner, Yevonne Aline, MD, 250 mg at 02/05/22 1046 ?  enoxaparin (LOVENOX) injection 40 mg, 40 mg, Subcutaneous, Q24H, Chotiner, Yevonne Aline, MD, 40 mg at 17/71/16 5790 ?  folic acid (FOLVITE) tablet 1 mg, 1 mg, Oral, Daily, Chotiner, Yevonne Aline, MD, 1 mg at 02/05/22 1046 ?  ipratropium-albuterol (DUONEB) 0.5-2.5 (3) MG/3ML nebulizer solution 3 mL, 3 mL, Nebulization, Q6H PRN, Chotiner, Yevonne Aline, MD ?  LORazepam (ATIVAN) tablet 1-4 mg, 1-4 mg, Oral, Q1H PRN **OR** LORazepam (ATIVAN) injection 1-4 mg, 1-4 mg, Intravenous, Q1H PRN, Chotiner, Yevonne Aline, MD ?  umeclidinium bromide (INCRUSE ELLIPTA) 62.5 MCG/ACT 1 puff, 1 puff, Inhalation, Daily, 1 puff at 02/05/22 0808 **AND** mometasone-formoterol (DULERA) 100-5 MCG/ACT inhaler 2 puff, 2 puff, Inhalation, BID, Chotiner, Yevonne Aline, MD, 2 puff at 02/05/22 0808 ?  multivitamin with minerals tablet 1 tablet, 1 tablet, Oral, Daily, Chotiner, Yevonne Aline, MD, 1 tablet at 02/05/22 1046 ?  ondansetron (ZOFRAN) tablet 4 mg, 4 mg, Oral, Q6H PRN **OR** ondansetron (ZOFRAN) injection 4 mg, 4 mg, Intravenous, Q6H PRN, Chotiner, Yevonne Aline, MD ?  senna-docusate (Senokot-S) tablet 1 tablet, 1 tablet, Oral, QHS PRN, Chotiner, Yevonne Aline, MD ?  thiamine tablet 100 mg, 100 mg, Oral, Daily, 100 mg at 02/05/22 1046 **OR** thiamine (B-1) injection 100 mg, 100 mg, Intravenous, Daily, Chotiner, Yevonne Aline, MD ?  ?Facility-Administered Medications Ordered in Other Encounters:  ?  heparin lock flush 100 unit/mL, 500 Units, Intracatheter, Daily PRN, Curt Bears, MD ?  sodium chloride flush (NS) 0.9 % injection 10 mL, 10 mL, Intracatheter, PRN, Curt Bears, MD ?  sodium chloride flush (NS) 0.9 % injection 10 mL, 10 mL, Intracatheter, PRN, Curt Bears, MD, 10 mL at  07/16/21 1606 ?  ?Patients Current Diet:  ?Diet Order   ?  ?         ?    Diet regular Room service appropriate? Yes; Fluid consistency: Thin  Diet effective now       ?  ?  ?   ?  ?  ?   ?  ?  ?  Precautions / Restrictions ?Precautions ?Precautions: Fall ?Precaution Comments: truncal and limb ataxia ?Restrictions ?Weight Bearing Restrictions: No  ?  ?Has the patient had 2 or more falls or a fall with injury in the past year? Yes ?  ?Prior Activity Level ?Limited Community (1-2x/wk): Went out 1-2 times a week to eat or to go to appointments. ?  ?Prior Functional Level ?Self Care: Did the patient need help bathing, dressing, using the toilet or eating? Independent ?  ?Indoor Mobility: Did the patient need assistance with walking from room to room (with or without device)? Independent ?  ?Stairs: Did the patient need assistance with internal or external stairs (with or without device)? Independent ?  ?Functional Cognition: Did the patient need help planning regular tasks such as shopping or remembering to take medications? Needed some help ?  ?Patient Information ?Are you of Hispanic, Latino/a,or Spanish origin?: A. No, not of Hispanic, Latino/a, or Spanish origin ?What is your race?: A. White ?Do you need or want an interpreter to communicate with a doctor or health care staff?: 0. No ?  ?Patient's Response To:  ?Health Literacy and Transportation ?Is the patient able to respond to health literacy and transportation needs?: Yes ?Health Literacy - How often do you need to have someone help you when you read instructions, pamphlets, or other written material from your doctor or pharmacy?: Sometimes ?In the past 12 months, has lack of transportation kept you from medical appointments or from getting medications?: No ?In the past 12 months, has lack of transportation kept you from meetings, work, or from getting things needed for daily living?: No ?  ?Home Assistive Devices / Equipment ?Home Assistive Devices/Equipment:  Gilford Rile (specify type) ?Home Equipment: Rollator (4 wheels), Cane - quad, BSC/3in1 ?  ?Prior Device Use: Indicate devices/aids used by the patient prior to current illness, exacerbation or injury? Walker and

## 2022-02-06 NOTE — Progress Notes (Signed)
Subjective: ?The patient is seen and examined today.  He is feeling fine today with no concerning complaints except for the weakness in the lower extremities more on the right.  He specifically denied having any chest pain, shortness of breath, cough or hemoptysis.  He has no nausea, vomiting, diarrhea or constipation.  He has repeat CT scan of the chest, abdomen pelvis as well as MRI of the brain. ? ?Objective: ?Vital signs in last 24 hours: ?Temp:  [97.6 ?F (36.4 ?C)-98.1 ?F (36.7 ?C)] 97.9 ?F (36.6 ?C) (03/17 2002) ?Pulse Rate:  [77-95] 91 (03/17 2002) ?Resp:  [14-18] 18 (03/17 2002) ?BP: (98-122)/(68-86) 122/84 (03/17 2002) ?SpO2:  [99 %-100 %] 100 % (03/17 2002) ?Weight:  [142 lb 10.2 oz (64.7 kg)] 142 lb 10.2 oz (64.7 kg) (03/17 2002) ? ?Intake/Output from previous day: ?No intake/output data recorded. ?Intake/Output this shift: ?No intake/output data recorded. ? ?General appearance: alert, cooperative, appears stated age, and no distress ?Resp: clear to auscultation bilaterally ?Cardio: regular rate and rhythm, S1, S2 normal, no murmur, click, rub or gallop ?GI: soft, non-tender; bowel sounds normal; no masses,  no organomegaly ?Extremities: extremities normal, atraumatic, no cyanosis or edema ? ?Lab Results:  ?Recent Labs  ?  02/05/22 ?2458 02/06/22 ?0457  ?WBC 3.4* 3.9*  ?HGB 11.1* 10.2*  ?HCT 31.4* 29.1*  ?PLT 140* 152  ? ?BMET ?Recent Labs  ?  02/05/22 ?0998 02/06/22 ?0457  ?NA 130* 132*  ?K 4.2 3.8  ?CL 97* 98  ?CO2 25 26  ?GLUCOSE 109* 102*  ?BUN 22* 24*  ?CREATININE 0.81 0.74  ?CALCIUM 9.1 8.6*  ? ? ?Studies/Results: ?DG Chest 2 View ? ?Result Date: 02/05/2022 ?CLINICAL DATA:  Lung cancer, hypertension, prostate cancer EXAM: CHEST - 2 VIEW COMPARISON:  04/12/2020 chest radiograph.  02/04/2022 chest CT. FINDINGS: Right internal jugular Port-A-Cath terminates in the lower third of the SVC. Stable cardiomediastinal silhouette with normal heart size. No pneumothorax. No pleural effusion. Stable masslike  upper left perihilar consolidation with associated volume loss and distortion. No acute consolidative airspace disease. IMPRESSION: Stable masslike upper left perihilar consolidation with associated volume loss and distortion, compatible with post treatment changes delineated on chest CT study from 1 day prior. Otherwise no active disease. No evidence of pneumothorax on today's radiographs. Electronically Signed   By: Ilona Sorrel M.D.   On: 02/05/2022 10:38  ? ?MR BRAIN W WO CONTRAST ? ?Result Date: 02/05/2022 ?CLINICAL DATA:  Metastatic lung cancer, assess treatment response EXAM: MRI HEAD WITHOUT AND WITH CONTRAST TECHNIQUE: Multiplanar, multiecho pulse sequences of the brain and surrounding structures were obtained without and with intravenous contrast. CONTRAST:  73mL GADAVIST GADOBUTROL 1 MMOL/ML IV SOLN COMPARISON:  12/18/2021 FINDINGS: Brain: Significant interval decrease in the size of multiple enhancing lesions in the bilateral cerebral and cerebellar hemispheres. 2 mm posterior right frontal lobe lesion (16:21), previously 6 mm. 6 mm right anterior parietal lobe lesion (16:38), previously 11 mm. 6 mm medial right parietal lobe lesion (16:40), previously 10 mm. 4 mm left frontal lobe lesions (16:43), previously 9 mm and 12 mm. 3 mm left parietal lesion (1647), previously 5 mm. 6 mm left frontal lobe lesion (16:71), previously 12 mm. 4 mm left temporal lesion (16:74) previously 10 mm. 4 mm left thalamic lesion (16:86), previously 9 mm. 8 mm left frontal lesion (16:87), previously 13 mm. 5 mm medial inferior left frontal lesion (16:92), previously 8 mm. 6 mm right mesial temporal lesion (16:94), previously up to 25 mm. 5 mm ill-defined enhancement in  the left mesial temporal lobe (16:92), previously 16 mm. 7 mm left temporal lesion (16:105), previously 16 mm. 10 mm medial right temporal lesion (16:113), previously up to 23 mm. 2 mm posterior right temporal lesion (16:106), previously 5 mm. 7 mm posterior right  temporal lesion (16:98), previously 11 mm. Enhancing lesions in the pons are now all sub 5 mm, with a previously noted 11 mm lesion now measuring approximately 5 mm (16:118). No enhancing lesions remain in the cerebellum. No definite new lesions are seen. Foci of restricted diffusion in the pons, bilateral temporal lobes, left periventricular white matter, and left frontal lobe (series 5, images 76, 77, 60, 58) appear to correlate with treated lesions and are not felt to reflect acute infarcts. No acute hemorrhage. Multiple foci of hemosiderin deposition are seen in the location of treated lesions. No mass effect or midline shift. Decreased edema at the site of the previous lesions. Confluent T2 hyperintense signal in the periventricular white matter, likely the sequela of a combination of chronic small vessel ischemic disease and treatment related effects. Cerebral volume loss is advanced for age. No hydrocephalus or extra-axial collection. Vascular: Normal flow voids. Skull and upper cervical spine: Normal marrow signal. Sinuses/Orbits: Small mucous retention cyst in the left maxillary sinus. The paranasal sinuses are otherwise clear. The orbits are unremarkable. Other: Fluid in the right-greater-than-left mastoid air cells. IMPRESSION: 1. Significant interval decrease in the size of previously noted enhancing lesions in the brain. No new lesions are seen. 2. No other acute intracranial process. Foci of restricted diffusion appear correlate with treated lesions and are not felt to reflect acute infarcts. Electronically Signed   By: Merilyn Baba M.D.   On: 02/05/2022 01:13  ? ?DG Knee AP/LAT W/Sunrise Right ? ?Result Date: 02/06/2022 ?CLINICAL DATA:  Pain.  Abrasion patella after fall at home. EXAM: RIGHT KNEE 3 VIEWS COMPARISON:  None. FINDINGS: No evidence of fracture, dislocation, or joint effusion. No evidence of arthropathy or other focal bone abnormality. Soft tissues are unremarkable. Vascular calcifications  are noted. IMPRESSION: No acute abnormality. Electronically Signed   By: Yvonne Kendall M.D.   On: 02/06/2022 13:41   ? ?Medications: I have reviewed the patient's current medications. ? ? ?Assessment/Plan: ?This is a very pleasant 61 years old white male with extensive stage small cell lung cancer diagnosed initially in May 2021 status post several chemotherapy regimens.  His systemic disease was under control but the patient continues to have recurrent and progressive brain metastasis.  He completed a course of whole brain irradiation recently. ?He presented with increasing fatigue and weakness after tapering his Decadron to 1 mg p.o. daily. ?I had a lengthy discussion with the patient and his wife today about his condition and treatment options. ?His dose of Decadron was increased to 2 mg p.o. twice daily and he is feeling a little bit better. ?Repeat CT scan of the chest, abdomen pelvis as well as MRI of the brain performed recently.  I personally and independently reviewed the scan images and discussed the results with the patient today. ?His scan showed no concerning findings for disease progression.  But there was a small pocket of pneumothorax on the left side that will need close monitoring. ?His MRI of the brain showed regression of the brain metastasis. ?I recommended for the patient to continue with the physical therapy to improve the strength in his lower extremities.  He is expected to be transferred to the inpatient rehab later today. ?I will arrange for the  patient a follow-up appointment with me after discharge for more discussion of his treatment options. ?Thank you for taking good care of Mr. Kloster.  Please call if you have any questions.  ? LOS: 0 days  ? ? ?Eilleen Kempf ?02/06/2022 ? ?  ?

## 2022-02-06 NOTE — Discharge Summary (Addendum)
Physician Discharge Summary  Richard Santos WUJ:[id number] DOB: 1961/08/02 DOA: 02/03/2022  PCP: Si Gaul, MD  Admit date: 02/03/2022 Discharge date: 02/06/2022 Admitted From: Home Disposition: CIR Recommendations for Outpatient Follow-up:  Oncology and neuro-oncology to arrange outpatient follow-up. 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 Arbutus Ped and Barbaraann Cao, DDD, EtOH abuse, HTN and tobacco use disorder presenting with lower extremity weakness, right> left and recurrent falls, and admitted for the same.  He was on steroid taper for his brain mets and went from 2 mg a day to 1 mg a day a week prior to presentation.  He had about 4 episodes of fall the day of presentation but no LOC or significant traumatic injury.  Orthostatic vitals positive.  CT head without contrast with no evidence of acute infarct or hemorrhage.  He was started on IV Decadron 2 mg twice daily.  Neurooncology recommended MRI brain which showed significant interval decrease in the size of previously noted enhancing lesions in the brain.  Evaluated by oncology and had CT chest/abdomen/pelvis that showed new 5 to 10% left-sided pneumothorax and new acute/subacute right anterior ninth and 10th ribs fracture and spondylosis and DDD at L5-S1 level contributing to right foraminal impingement.    Two view CXR on 3/16 with no evidence of pneumothorax.  Patient remains stable from respiratory standpoint.  Therapy recommended CIR.    See individual problem list below for more on hospital course.  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  Weakness is more pronounced in RLE.  Some foraminal impingement at L5-S1 on CT abdomen and pelvis. Orthostatic vitals positive but patient denied dizziness.  CT head without contrast without acute finding.  Patient has SCLC with brain mets.  Started on IV Decadron 2 mg twice daily.  Repeat MRI brain with decrease in size of tumor burden.  -Apply TED hose.  Consider right knee brace.  -Continue p.o. Decadron 2 mg twice daily -Continue home Depakote for seizure prophylaxis. -Stopped tramadol on discharge-for lower his seizure threshold. -Continue PT/OT-recommended CIR. -Fall precautions  Orthostatic hypotension Patient dose is down to the floor by nursing take when he got up to use bedside commode.  He attributed this to his right leg.  Orthostatic vitals positive but he denies dizziness. -Fall precaution and TED hose as above -Encourage oral hydration -Elevate head of bed to at least 30 degree -Continue PT/OT  Recurrent falls Could be due to generalized weakness, alcohol or adrenal insufficiency from chronic steroid use -See generalized weakness above  Metastatic cancer to brain (HCC) Decadron as above.  Small cell lung cancer, left upper lobe (HCC) Followed by Dr. Arbutus Ped for [facility] and Dr. Barbaraann Cao for brain mets.  MRI brain with interval decrease in brain mets.  CT chest/abdomen/pelvis with stable postradiation change and a sclerotic metastatic disease -Appreciate input  by oncology and radiation oncology.  Pneumothorax 5 to 10% left-sided pneumothorax noted on CT chest.  Patient has no respiratory distress.  Repeat CXR without pneumothorax. -Monitor  respiratory status  Multiple rib fractures CT chest showed acute/subacute anterior right ninth and 10th rib fracture.  Patient is not in pain. -Supportive care  Pancytopenia (HCC) Relatively stable.  There could be some element of hemodilution. -Recheck CBC in about 1 week  Alcohol use Continues to drink 3 beers a day. Used to drink more.  No withdrawal symptoms. -Continue multivitamin, thiamine and folic acid  Hyponatremia Recent Labs  Lab 02/03/22 1905 02/04/22 0536 02/05/22 0514 02/06/22 0457  NA 134* 135 130* 132*  -Continue monitoring   Malignant neoplasm of prostate (HCC) S/p brachitherapy. Reports urinary hesitancy. Bladder scan 240 cc. Had 350 on I&O cath -Monitor urine output -Start bethanecol -He may not tolerate Flomax given orthostatic hypotension.   Tobacco abuse Continues to smoke a few cigarettes a day Encourage cessation Nicotine patch if needed  HTN (hypertension) Does not seem to be on medication for this. -Continue monitoring  Hypomagnesemia Mg 1.7 -IV magnesium sulfate 2 g x 1 prior to discharge  Protein-calorie malnutrition, severe Body mass index is 17.63 kg/m.  Has significant muscle mass and subcu fat loss. Consult dietitian   Nutrition Problem: Severe Malnutrition Etiology: chronic illness, catabolic illness, cancer and cancer related treatments Signs/Symptoms: severe fat depletion, severe muscle depletion, percent weight loss Interventions: MVI, Liberalize Diet     Vital signs Vitals:   02/06/22 0831 02/06/22 0942 02/06/22 1222 02/06/22 1651  BP:  103/75 111/86 98/68  Pulse:  95 93 91  Temp:  97.9 F (36.6 C) 97.6 F (36.4 C) 97.7 F (36.5 C)  Resp:  18 18 18   Height:      Weight:      SpO2: 100% 99% 100% 99%  TempSrc:  Oral Oral Oral  BMI (Calculated):         Discharge exam  GENERAL: No apparent distress.  Nontoxic. HEENT: MMM.  Vision and hearing grossly intact.  NECK: Supple.  No apparent JVD.  RESP:  No  IWOB.  Fair aeration bilaterally. CVS:  RRR. Heart sounds normal.  ABD/GI/GU: BS+. Abd soft, NTND.  MSK/EXT:  Moves extremities.  Significant muscle mass and subcu fat loss. SKIN: no apparent skin lesion or wound NEURO: Awake and alert. Oriented appropriately.  No apparent focal neuro deficit except for slight weakness in RLE with hip flexion. PSYCH: Calm. Normal affect.   Discharge Instructions Discharge Instructions     Diet general   Complete by: As directed    Increase activity slowly   Complete by: As directed       Allergies as of 02/06/2022       Reactions   Bee Venom Swelling   Elemental Sulfur Other (See Comments)   Acute renal failure   Cosentyx [secukinumab] Swelling   Other Other (See Comments)   Chemo medicine- patient not sure about name - swelling & rashes   Sulfa Antibiotics Other (See Comments)   Pt does not remember reaction   Namenda [memantine] Nausea Only        Medication List     STOP taking these medications    famotidine 20 MG tablet Commonly known as: Pepcid   ibuprofen 200 MG tablet Commonly known as: ADVIL   sodium bicarbonate 650 MG tablet   traMADol 50 MG tablet Commonly known as: ULTRAM       TAKE these medications  aspirin EC 81 MG tablet Take 81 mg by mouth daily. Swallow whole.   bethanechol 10 MG tablet Commonly known as: URECHOLINE Take 2.5 tablets (25 mg total) by mouth 3 (three) times daily for 2 days, THEN 1 tablet (10 mg total) 3 (three) times daily for 7 days. Start taking on: February 06, 2022   Breztri Aerosphere 160-9-4.8 MCG/ACT Aero Generic drug: Budeson-Glycopyrrol-Formoterol Inhale 2 puffs into the lungs in the morning and at bedtime.   busPIRone 10 MG tablet Commonly known as: BUSPAR TAKE 1 TABLET BY MOUTH TWICE A DAY   cyanocobalamin 1000 MCG tablet Take 1 tablet (1,000 mcg total) by mouth daily. Start taking on: February 07, 2022   dexamethasone 2 MG tablet Commonly known as: DECADRON Take 1  tablet (2 mg total) by mouth every 12 (twelve) hours. What changed:  medication strength See the new instructions.   divalproex 250 MG DR tablet Commonly known as: Depakote Take 1 tablet (250 mg total) by mouth 2 (two) times daily.   folic acid 1 MG tablet Commonly known as: FOLVITE Take 1 tablet (1 mg total) by mouth daily. Start taking on: February 07, 2022   lovastatin 20 MG tablet Commonly known as: MEVACOR Take 1 tablet (20 mg total) by mouth at bedtime.   multivitamin capsule Take 1 capsule by mouth daily.   ondansetron 8 MG disintegrating tablet Commonly known as: ZOFRAN-ODT TAKE 1 TABLET BY MOUTH EVERY 8 HOURS AS NEEDED FOR NAUSEA AND VOMITING What changed: See the new instructions.   pantoprazole 40 MG tablet Commonly known as: PROTONIX Take 40 mg by mouth every morning.   prochlorperazine 10 MG tablet Commonly known as: COMPAZINE TAKE 1 TABLET BY MOUTH EVERY 6 HOURS AS NEEDED What changed: reasons to take this   SALONPAS EX Apply 1 patch topically daily as needed (pain).   thiamine 100 MG tablet Take 1 tablet (100 mg total) by mouth daily. Start taking on: February 07, 2022        Consultations: Oncology Neurooncology  Procedures/Studies:   DG Chest 2 View  Result Date: 02/05/2022 CLINICAL DATA:  Lung cancer, hypertension, prostate cancer EXAM: CHEST - 2 VIEW COMPARISON:  04/12/2020 chest radiograph.  02/04/2022 chest CT. FINDINGS: Right internal jugular Port-A-Cath terminates in the lower third of the SVC. Stable cardiomediastinal silhouette with normal heart size. No pneumothorax. No pleural effusion. Stable masslike upper left perihilar consolidation with associated volume loss and distortion. No acute consolidative airspace disease. IMPRESSION: Stable masslike upper left perihilar consolidation with associated volume loss and distortion, compatible with post treatment changes delineated on chest CT study from 1 day prior. Otherwise no active disease. No  evidence of pneumothorax on today's radiographs. Electronically Signed   By: Delbert Phenix M.D.   On: 02/05/2022 10:38   DG Lumbar Spine Complete  Result Date: 02/03/2022 CLINICAL DATA:  Falls.  Back pain. EXAM: LUMBAR SPINE - COMPLETE 4+ VIEW COMPARISON:  CT abdomen and pelvis 12/19/2021 FINDINGS: There are 5 non-rib-bearing lumbar-type vertebral bodies. Normal frontal alignment. No sagittal spondylolisthesis. Vertebral body heights are maintained. L5-S1 severe disc space narrowing, endplate sclerosis vacuum phenomenon and peripheral osteophytosis. The other disc spaces appear preserved. Vascular calcifications are noted. IMPRESSION: L5-S1 severe degenerative disc and endplate changes. Electronically Signed   By: Neita Garnet M.D.   On: 02/03/2022 19:38   CT Head Wo Contrast  Result Date: 02/03/2022 CLINICAL DATA:  History of brain and small cell lung cancer, right leg weakness, multiple falls EXAM: CT HEAD WITHOUT CONTRAST  TECHNIQUE: Contiguous axial images were obtained from the base of the skull through the vertex without intravenous contrast. RADIATION DOSE REDUCTION: This exam was performed according to the departmental dose-optimization program which includes automated exposure control, adjustment of the mA and/or kV according to patient size and/or use of iterative reconstruction technique. COMPARISON:  12/18/2021 FINDINGS: Brain: No evidence of acute infarct or hemorrhage. The innumerable brain metastases seen on prior MRI are not well visualized on unenhanced CT. Hypodensities throughout the periventricular and subcortical white matter correspond with the areas of edema seen on prior MRI. Lateral ventricles and midline structures are otherwise unremarkable. No acute extra-axial fluid collections. No mass effect. Vascular: No hyperdense vessel or unexpected calcification. Skull: Normal. Negative for fracture or focal lesion. Sinuses/Orbits: Chronic bilateral mastoid effusions. The paranasal sinuses  are otherwise clear. Other: None. IMPRESSION: 1. No evidence of acute infarct or hemorrhage. 2. The innumerable brain metastases seen on prior MRI are not well visualized on unenhanced CT. Hypodensities in the periventricular and subcortical white matter consistent with edema on prior MRI, stable. Electronically Signed   By: Sharlet Salina M.D.   On: 02/03/2022 19:52   CT CHEST W CONTRAST  Result Date: 02/04/2022 CLINICAL DATA:  Staging of small cell lung cancer. * Tracking Code: BO * EXAM: CT CHEST, ABDOMEN, AND PELVIS WITH CONTRAST TECHNIQUE: Multidetector CT imaging of the chest, abdomen and pelvis was performed following the standard protocol during bolus administration of intravenous contrast. RADIATION DOSE REDUCTION: This exam was performed according to the departmental dose-optimization program which includes automated exposure control, adjustment of the mA and/or kV according to patient size and/or use of iterative reconstruction technique. CONTRAST:  OMNIPAQUE IOHEXOL 300 MG/ML  SOLN COMPARISON:  12/19/2021 FINDINGS: CT CHEST FINDINGS Cardiovascular: Right Port-A-Cath tip: SVC. Coronary, aortic arch, and branch vessel atherosclerotic vascular disease. Mediastinum/Nodes: Small left pneumothorax, equivocal trace pneumomediastinum for example on image 89 of series 508 although much of the appearance is probably from the 5-10% left pneumothorax. No pathologic adenopathy identified. Lungs/Pleura: Biapical pleuroparenchymal scarring. 5-10% left pneumothorax anteriorly, new compared to the 12/19/2021 exam. Mild emphysema. Substantial left perihilar and paramediastinal volume loss and consolidation on the left primarily in the suprahilar region, compatible with prior radiation therapy. There is some localized nodularity in the left upper lobe just above a cavitary lesion, this nodule measures 1.1 by 0.7 cm on image 38 series 508, stable. The cavitary lesion measures about 2.5 by 1.7 cm on image 44 series  508, formerly 2.7 by 1.6 cm. Musculoskeletal: Healing right anterolateral rib fractures noted. Newly apparent fractures of the right anterior ninth and tenth ribs. Old left anterior second rib fracture, healed. No new left rib fractures are identified. There is a sclerotic lesion in the upper sternal body on image 86 series 507 which is not substantially changed from the prior exam Other sclerotic lesions in the thoracic spine compatible with prior metastatic lesions appear similar to previous including right pedicle and posterior element involvement by sclerotic metastatic lesions in the sixth and seventh thoracic vertebra and in the left eighth thoracic vertebra posterior elements. Small sclerotic lesion centrally in the T8 vertebral body noted. CT ABDOMEN PELVIS FINDINGS Hepatobiliary: Contracted gallbladder.  Otherwise unremarkable. Pancreas: Unremarkable Spleen: Unremarkable Adrenals/Urinary Tract: 2.6 by 2.1 cm cyst of the right mid kidney anteriorly on image 75 series 504. 4 mm nonobstructive left kidney upper pole calculus, image 90 series 505. Adrenal glands unremarkable. Stomach/Bowel: Mild sigmoid colon diverticulosis. Vascular/Lymphatic: Atherosclerosis is present, including aortoiliac atherosclerotic disease.  Reproductive: Brachytherapy seed implants in the prostate gland. Other: Trace amount of gas in the left anterior abdominal wall subcutaneous tissues on image 94 series 504, possibly injection related. Musculoskeletal: Stable 1.2 cm sclerotic lesion in the right proximal femur on image 135 series 504. Stable 1.4 cm sclerotic lesion in the left femoral head, image 123 series 504. No new bony lesions. There is evidence of spondylosis and degenerative disc disease at the L5-S1 level contributing to right foraminal impingement. IMPRESSION: 1. New 5-10% left-sided pneumothorax. 2. New acute/subacute fractures of the right anterior ninth and tenth ribs. Additional right-sided ribs demonstrate healing  response in previously identified rib fractures. I do not see a definite acute left rib fracture. 3. Stable appearance of post radiation therapy related paramediastinal and perihilar scarring on the left. Stable appearance of nodule and adjacent cavitary lesion at the left lung apex. 4. Stable appearance of sclerotic metastatic disease involving the thoracic spine and proximal femurs. 5. Other imaging findings of potential clinical significance: Aortic Atherosclerosis (ICD10-I70.0). Coronary atherosclerosis. Nonobstructive left nephrolithiasis. Sigmoid colon diverticulosis. Right foraminal impingement at L5-S1. Critical Value/emergent results were called by telephone at the time of interpretation on 02/04/2022 at 8:43 pm to provider [facility] , who verbally acknowledged these results. Electronically Signed   By: Gaylyn Rong M.D.   On: 02/04/2022 20:52   MR BRAIN W WO CONTRAST  Result Date: 02/05/2022 CLINICAL DATA:  Metastatic lung cancer, assess treatment response EXAM: MRI HEAD WITHOUT AND WITH CONTRAST TECHNIQUE: Multiplanar, multiecho pulse sequences of the brain and surrounding structures were obtained without and with intravenous contrast. CONTRAST:  6mL GADAVIST GADOBUTROL 1 MMOL/ML IV SOLN COMPARISON:  12/18/2021 FINDINGS: Brain: Significant interval decrease in the size of multiple enhancing lesions in the bilateral cerebral and cerebellar hemispheres. 2 mm posterior right frontal lobe lesion (16:21), previously 6 mm. 6 mm right anterior parietal lobe lesion (16:38), previously 11 mm. 6 mm medial right parietal lobe lesion (16:40), previously 10 mm. 4 mm left frontal lobe lesions (16:43), previously 9 mm and 12 mm. 3 mm left parietal lesion (1647), previously 5 mm. 6 mm left frontal lobe lesion (16:71), previously 12 mm. 4 mm left temporal lesion (16:74) previously 10 mm. 4 mm left thalamic lesion (16:86), previously 9 mm. 8 mm left frontal lesion (16:87), previously 13 mm. 5 mm medial  inferior left frontal lesion (16:92), previously 8 mm. 6 mm right mesial temporal lesion (16:94), previously up to 25 mm. 5 mm ill-defined enhancement in the left mesial temporal lobe (16:92), previously 16 mm. 7 mm left temporal lesion (16:105), previously 16 mm. 10 mm medial right temporal lesion (16:113), previously up to 23 mm. 2 mm posterior right temporal lesion (16:106), previously 5 mm. 7 mm posterior right temporal lesion (16:98), previously 11 mm. Enhancing lesions in the pons are now all sub 5 mm, with a previously noted 11 mm lesion now measuring approximately 5 mm (16:118). No enhancing lesions remain in the cerebellum. No definite new lesions are seen. Foci of restricted diffusion in the pons, bilateral temporal lobes, left periventricular white matter, and left frontal lobe (series 5, images 76, 77, 60, 58) appear to correlate with treated lesions and are not felt to reflect acute infarcts. No acute hemorrhage. Multiple foci of hemosiderin deposition are seen in the location of treated lesions. No mass effect or midline shift. Decreased edema at the site of the previous lesions. Confluent T2 hyperintense signal in the periventricular white matter, likely the sequela of a combination of chronic small  vessel ischemic disease and treatment related effects. Cerebral volume loss is advanced for age. No hydrocephalus or extra-axial collection. Vascular: Normal flow voids. Skull and upper cervical spine: Normal marrow signal. Sinuses/Orbits: Small mucous retention cyst in the left maxillary sinus. The paranasal sinuses are otherwise clear. The orbits are unremarkable. Other: Fluid in the right-greater-than-left mastoid air cells. IMPRESSION: 1. Significant interval decrease in the size of previously noted enhancing lesions in the brain. No new lesions are seen. 2. No other acute intracranial process. Foci of restricted diffusion appear correlate with treated lesions and are not felt to reflect acute  infarcts. Electronically Signed   By: Wiliam Ke M.D.   On: 02/05/2022 01:13   CT ABDOMEN PELVIS W CONTRAST  Result Date: 02/04/2022 CLINICAL DATA:  Staging of small cell lung cancer. * Tracking Code: BO * EXAM: CT CHEST, ABDOMEN, AND PELVIS WITH CONTRAST TECHNIQUE: Multidetector CT imaging of the chest, abdomen and pelvis was performed following the standard protocol during bolus administration of intravenous contrast. RADIATION DOSE REDUCTION: This exam was performed according to the departmental dose-optimization program which includes automated exposure control, adjustment of the mA and/or kV according to patient size and/or use of iterative reconstruction technique. CONTRAST:  OMNIPAQUE IOHEXOL 300 MG/ML  SOLN COMPARISON:  12/19/2021 FINDINGS: CT CHEST FINDINGS Cardiovascular: Right Port-A-Cath tip: SVC. Coronary, aortic arch, and branch vessel atherosclerotic vascular disease. Mediastinum/Nodes: Small left pneumothorax, equivocal trace pneumomediastinum for example on image 89 of series 508 although much of the appearance is probably from the 5-10% left pneumothorax. No pathologic adenopathy identified. Lungs/Pleura: Biapical pleuroparenchymal scarring. 5-10% left pneumothorax anteriorly, new compared to the 12/19/2021 exam. Mild emphysema. Substantial left perihilar and paramediastinal volume loss and consolidation on the left primarily in the suprahilar region, compatible with prior radiation therapy. There is some localized nodularity in the left upper lobe just above a cavitary lesion, this nodule measures 1.1 by 0.7 cm on image 38 series 508, stable. The cavitary lesion measures about 2.5 by 1.7 cm on image 44 series 508, formerly 2.7 by 1.6 cm. Musculoskeletal: Healing right anterolateral rib fractures noted. Newly apparent fractures of the right anterior ninth and tenth ribs. Old left anterior second rib fracture, healed. No new left rib fractures are identified. There is a sclerotic lesion  in the upper sternal body on image 86 series 507 which is not substantially changed from the prior exam Other sclerotic lesions in the thoracic spine compatible with prior metastatic lesions appear similar to previous including right pedicle and posterior element involvement by sclerotic metastatic lesions in the sixth and seventh thoracic vertebra and in the left eighth thoracic vertebra posterior elements. Small sclerotic lesion centrally in the T8 vertebral body noted. CT ABDOMEN PELVIS FINDINGS Hepatobiliary: Contracted gallbladder.  Otherwise unremarkable. Pancreas: Unremarkable Spleen: Unremarkable Adrenals/Urinary Tract: 2.6 by 2.1 cm cyst of the right mid kidney anteriorly on image 75 series 504. 4 mm nonobstructive left kidney upper pole calculus, image 90 series 505. Adrenal glands unremarkable. Stomach/Bowel: Mild sigmoid colon diverticulosis. Vascular/Lymphatic: Atherosclerosis is present, including aortoiliac atherosclerotic disease. Reproductive: Brachytherapy seed implants in the prostate gland. Other: Trace amount of gas in the left anterior abdominal wall subcutaneous tissues on image 94 series 504, possibly injection related. Musculoskeletal: Stable 1.2 cm sclerotic lesion in the right proximal femur on image 135 series 504. Stable 1.4 cm sclerotic lesion in the left femoral head, image 123 series 504. No new bony lesions. There is evidence of spondylosis and degenerative disc disease at the L5-S1 level contributing  to right foraminal impingement. IMPRESSION: 1. New 5-10% left-sided pneumothorax. 2. New acute/subacute fractures of the right anterior ninth and tenth ribs. Additional right-sided ribs demonstrate healing response in previously identified rib fractures. I do not see a definite acute left rib fracture. 3. Stable appearance of post radiation therapy related paramediastinal and perihilar scarring on the left. Stable appearance of nodule and adjacent cavitary lesion at the left lung apex.  4. Stable appearance of sclerotic metastatic disease involving the thoracic spine and proximal femurs. 5. Other imaging findings of potential clinical significance: Aortic Atherosclerosis (ICD10-I70.0). Coronary atherosclerosis. Nonobstructive left nephrolithiasis. Sigmoid colon diverticulosis. Right foraminal impingement at L5-S1. Critical Value/emergent results were called by telephone at the time of interpretation on 02/04/2022 at 8:43 pm to provider [facility] , who verbally acknowledged these results. Electronically Signed   By: Gaylyn Rong M.D.   On: 02/04/2022 20:52   DG Knee AP/LAT W/Sunrise Right  Result Date: 02/06/2022 CLINICAL DATA:  Pain.  Abrasion patella after fall at home. EXAM: RIGHT KNEE 3 VIEWS COMPARISON:  None. FINDINGS: No evidence of fracture, dislocation, or joint effusion. No evidence of arthropathy or other focal bone abnormality. Soft tissues are unremarkable. Vascular calcifications are noted. IMPRESSION: No acute abnormality. Electronically Signed   By: Neita Garnet M.D.   On: 02/06/2022 13:41       The results of significant diagnostics from this hospitalization (including imaging, microbiology, ancillary and laboratory) are listed below for reference.     Microbiology: Recent Results (from the past 240 hour(s))  Resp Panel by RT-PCR (Flu A&B, Covid) Urine, Clean Catch     Status: None   Collection Time: 02/03/22  8:22 PM   Specimen: Urine, Clean Catch; Nasopharyngeal(NP) swabs in vial transport medium  Result Value Ref Range Status   SARS Coronavirus 2 by RT PCR NEGATIVE NEGATIVE Final    Comment: (NOTE) SARS-CoV-2 target nucleic acids are NOT DETECTED.  The SARS-CoV-2 RNA is generally detectable in upper respiratory specimens during the acute phase of infection. The lowest concentration of SARS-CoV-2 viral copies this assay can detect is 138 copies/mL. A negative result does not preclude SARS-Cov-2 infection and should not be used as the sole  basis for treatment or other patient management decisions. A negative result may occur with  improper specimen collection/handling, submission of specimen other than nasopharyngeal swab, presence of viral mutation(s) within the areas targeted by this assay, and inadequate number of viral copies(<138 copies/mL). A negative result must be combined with clinical observations, patient history, and epidemiological information. The expected result is Negative.  Fact Sheet for Patients:  BloggerCourse.com  Fact Sheet for Healthcare Providers:  SeriousBroker.it  This test is no t yet approved or cleared by the [place] FDA and  has been authorized for detection and/or diagnosis of SARS-CoV-2 by FDA under an Emergency Use Authorization (EUA). This EUA will remain  in effect (meaning this test can be used) for the duration of the COVID-19 declaration under Section 564(b)(1) of the Act, 21 U.S.C.section 360bbb-3(b)(1), unless the authorization is terminated  or revoked sooner.       Influenza A by PCR NEGATIVE NEGATIVE Final   Influenza B by PCR NEGATIVE NEGATIVE Final    Comment: (NOTE) The Xpert Xpress SARS-CoV-2/FLU/RSV plus assay is intended as an aid in the diagnosis of influenza from Nasopharyngeal swab specimens and should not be used as a sole basis for treatment. Nasal washings and aspirates are unacceptable for Xpert Xpress SARS-CoV-2/FLU/RSV testing.  Fact Sheet for Patients: BloggerCourse.com  Fact Sheet for Healthcare Providers: SeriousBroker.it  This test is not yet approved or cleared by the [place] FDA and has been authorized for detection and/or diagnosis of SARS-CoV-2 by FDA under an Emergency Use Authorization (EUA). This EUA will remain in effect (meaning this test can be used) for the duration of the COVID-19 declaration under Section 564(b)(1) of the Act,  21 U.S.C. section 360bbb-3(b)(1), unless the authorization is terminated or revoked.  Performed at [facility], [address], [place], [place] [address]      Labs:  CBC: Recent Labs  Lab 02/03/22 1905 02/04/22 0536 02/05/22 0514 02/06/22 0457  WBC 5.1 3.9* 3.4* 3.9*  NEUTROABS 4.0  --   --   --   HGB 10.6* 10.1* 11.1* 10.2*  HCT 30.3* 29.3* 31.4* 29.1*  MCV 104.5* 105.0* 104.0* 104.7*  PLT 146* 131* 140* 152   BMP &GFR Recent Labs  Lab 02/03/22 1905 02/04/22 0536 02/05/22 0514 02/06/22 0457  NA 134* 135 130* 132*  K 3.9 3.9 4.2 3.8  CL 98 100 97* 98  CO2 26 27 25 26   GLUCOSE 103* 88 109* 102*  BUN 31* 24* 22* 24*  CREATININE 1.23 0.85 0.81 0.74  CALCIUM 9.1 9.0 9.1 8.6*  MG  --   --  1.5* 1.7  PHOS  --   --  4.2 4.0   Estimated Creatinine Clearance: 81.9 mL/min (by C-G formula based on SCr of 0.74 mg/dL). Liver & Pancreas: Recent Labs  Lab 02/03/22 1905 02/05/22 0514 02/06/22 0457  AST 27  --   --   ALT 28  --   --   ALKPHOS 40  --   --   BILITOT 0.5  --   --   PROT 6.7  --   --   ALBUMIN 3.7 3.5 3.3*   No results for input(s): LIPASE, AMYLASE in the last 168 hours. No results for input(s): AMMONIA in the last 168 hours. Diabetic: No results for input(s): HGBA1C in the last 72 hours. No results for input(s): GLUCAP in the last 168 hours. Cardiac Enzymes: Recent Labs  Lab 02/06/22 0457  CKTOTAL 26*   No results for input(s): PROBNP in the last 8760 hours. Coagulation Profile: No results for input(s): INR, PROTIME in the last 168 hours. Thyroid Function Tests: No results for input(s): TSH, T4TOTAL, FREET4, T3FREE, THYROIDAB in the last 72 hours. Lipid Profile: No results for input(s): CHOL, HDL, LDLCALC, TRIG, CHOLHDL, LDLDIRECT in the last 72 hours. Anemia Panel: Recent Labs    02/06/22 0457  VITAMINB12 218  FOLATE 14.0  FERRITIN 812*  TIBC 295  IRON 124  RETICCTPCT 2.5   Urine analysis:    Component Value  Date/Time   COLORURINE YELLOW 02/03/2022 2022   APPEARANCEUR CLEAR 02/03/2022 2022   LABSPEC 1.019 02/03/2022 2022   PHURINE 6.0 02/03/2022 2022   GLUCOSEU NEGATIVE 02/03/2022 2022   GLUCOSEU NEGATIVE 12/12/2019 1506   HGBUR NEGATIVE 02/03/2022 2022   BILIRUBINUR NEGATIVE 02/03/2022 2022   KETONESUR 5 (A) 02/03/2022 2022   PROTEINUR NEGATIVE 02/03/2022 2022   UROBILINOGEN 0.2 12/12/2019 1506   NITRITE NEGATIVE 02/03/2022 2022   LEUKOCYTESUR NEGATIVE 02/03/2022 2022   Sepsis Labs: Invalid input(s): PROCALCITONIN, LACTICIDVEN   Time coordinating discharge: 45 minutes  SIGNED:  Almon Hercules, MD  [facility] 02/06/2022, 5:17 PM

## 2022-02-06 NOTE — Assessment & Plan Note (Signed)
S/p brachitherapy. Reports urinary hesitancy. Bladder scan 240 cc. Had 350 on I&O cath ?-Monitor urine output ?-Start bethanecol ?-He may not tolerate Flomax given orthostatic hypotension.  ?

## 2022-02-06 NOTE — Progress Notes (Signed)
INPATIENT REHABILITATION ADMISSION NOTE ? ? ?Arrival Method: Via [facility] at 1800  ? ?   ?Mental Orientation: A/Ox4 , forgetful  ? ? ?Assessment: Complete  ? ? ?Skin: R Knee abrasion, L elbow abrasion , generalized ecchymosis   ? ? ?IV'S: R chest port accessed ? ? ?Pain: none ? ? ?Tubes and Drains: None   ? ? ?Safety Measures: 3 side rails up , call bell within reach , bed alarm on.  ? ? ?Vital Signs: complete ? ? ?Height and Weight: complete  ? ? ?Rehab Orientation: Oriented to unit. Pt shows understanding , no questions at this time.  ? ? ?Family: Wife notified  ? ? ? ?Arn Medal LPN ?

## 2022-02-06 NOTE — Progress Notes (Signed)
Physical Therapy Treatment ?Patient Details ?Name: Richard Santos ?MRN: [id number] ?DOB: [birth year] ?Today's Date: 02/06/2022 ? ? ?History of Present Illness 61 yo male presents to [facility] on 3/14 with progressive weakness over the past several weeks, falls. 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 Julien Nordmann and Mickeal Skinner, DDD, EtOH abuse, HTN and tobacco use disorder. ? ?  ?PT Comments  ? ? Noted pt had was assisted to the floor this morning with NT when pt's RLE buckled in standing. Cotreated with OT this session. Sit to stand x 6 using Stedy lift equipment. Stedy used to transfer to 3 in 1 and then to recliner. BP sitting 101/85, HR 109; BP standing 84/72, HR 129, pt reported no dizziness. Pt performed standing BLE exercises with consistent buckling of RLE. Order for knee immobilizer was requested.    ?Recommendations for follow up therapy are one component of a multi-disciplinary discharge planning process, led by the attending physician.  Recommendations may be updated based on patient status, additional functional criteria and insurance authorization. ? ?Follow Up Recommendations ? Acute inpatient rehab (3hours/day) ?  ?  ?Assistance Recommended at Discharge    ?Patient can return home with the following A lot of help with walking and/or transfers;A little help with bathing/dressing/bathroom;Direct supervision/assist for medications management;Assistance with cooking/housework;Assist for transportation;Help with stairs or ramp for entrance ?  ?Equipment Recommendations ? Other (comment) (TBD at next venue)  ?  ?Recommendations for Other Services   ? ? ?  ?Precautions / Restrictions Precautions ?Precautions: Fall ?Precaution Comments: truncal and limb ataxia; assisted fall to floor this morning with NT when RLE buckled in standing ?Restrictions ?Weight Bearing Restrictions: No  ?  ? ?Mobility ? Bed Mobility ?Overal bed mobility: Needs Assistance ?Bed Mobility: Supine to Sit ?   ?  ?Supine to sit: Supervision ?  ?  ?General bed mobility comments: increased time, VCs for technique ?  ? ?Transfers ?Overall transfer level: Needs assistance ?  ?Transfers: Sit to/from Stand, Bed to chair/wheelchair/BSC ?Sit to Stand: Min guard ?  ?  ?  ?  ?  ?General transfer comment: sit to stand x 6 trials with VCs for hand placement. Used Stedy to transfer to 3 in 1 then to recliner. Pt performed standing mini squats and marching in [place] wtih frequent buckling of RLE. ?Transfer via Lift Equipment: Stedy ? ?Ambulation/Gait ?  ?  ?  ?  ?Gait velocity: decr ?  ?  ?General Gait Details: deferred 2* frequent buckling of RLE in standing, requested order for knee immobilizer from Dr Cyndia Skeeters ? ? ?Stairs ?  ?  ?  ?  ?  ? ? ?Wheelchair Mobility ?  ? ?Modified Rankin (Stroke Patients Only) ?  ? ? ?  ?Balance Overall balance assessment: Needs assistance, History of Falls (history of multiple falls over the course of the past several weeks, x4 falls day before admission) ?Sitting-balance support: No upper extremity supported, Feet supported ?Sitting balance-Leahy Scale: Fair ?Sitting balance - Comments: able to sit EOB unsupported, requires UE support dynamically ?Postural control: Posterior lean, Right lateral lean ?Standing balance support: Bilateral upper extremity supported, During functional activity ?Standing balance-Leahy Scale: Poor ?  ?  ?  ?  ?  ?  ?  ?  ?  ?  ?  ?  ?  ? ?  ?Cognition Arousal/Alertness: Awake/alert ?Behavior During Therapy: [facility] for tasks assessed/performed ?Overall Cognitive Status: Impaired/Different from baseline ?Area of Impairment: Problem solving, Attention,  Following commands, Safety/judgement ?  ?  ?  ?  ?  ?  ?  ?  ?  ?Current Attention Level: Sustained ?  ?Following Commands: Follows one step commands with increased time, Follows multi-step commands inconsistently ?Safety/Judgement: Decreased awareness of deficits, Decreased awareness of safety ?  ?Problem Solving: Slow processing,  Decreased initiation, Requires verbal cues, Requires tactile cues ?General Comments: Pt A&Ox4, increased processing time to follow one-step commands, at times requires repeated cues for following commands ?  ?  ? ?  ?Exercises General Exercises - Lower Extremity ?Hip Flexion/Marching: AROM, Both, 10 reps, Standing (in [place]) ?Mini-Sqauts: AROM, Both, 5 reps (in [place]) ? ?  ?General Comments General comments (skin integrity, edema, etc.): BP sitting 101/85, HR 109; standing 84/72, HR 129. Pt denied dizziness. ?  ?  ? ?Pertinent Vitals/Pain Pain Assessment ?Pain Score: 0-No pain  ? ? ?Home Living   ?  ?  ?  ?  ?  ?  ?  ?  ?  ?   ?  ?Prior Function    ?  ?  ?   ? ?PT Goals (current goals can now be found in the care plan section) Acute Rehab PT Goals ?Patient Stated Goal: get back to my life, play golf ?PT Goal Formulation: With patient ?Time For Goal Achievement: 02/18/22 ?Potential to Achieve Goals: Good ?Progress towards PT goals: Not progressing toward goals - comment ? ?  ?Frequency ? ? ? Min 3X/week ? ? ? ?  ?PT Plan Current plan remains appropriate  ? ? ?Co-evaluation PT/OT/SLP Co-Evaluation/Treatment: Yes ?  ?PT goals addressed during session: Mobility/safety with mobility;Balance;Proper use of DME;Strengthening/ROM ?  ?  ? ?  ?AM-PAC PT "6 Clicks" Mobility   ?Outcome Measure ? Help needed turning from your back to your side while in a flat bed without using bedrails?: A Little ?Help needed moving from lying on your back to sitting on the side of a flat bed without using bedrails?: A Little ?Help needed moving to and from a bed to a chair (including a wheelchair)?: A Lot ?Help needed standing up from a chair using your arms (e.g., wheelchair or bedside chair)?: A Little ?Help needed to walk in hospital room?: A Lot ?Help needed climbing 3-5 steps with a railing? : Total ?6 Click Score: 14 ? ?  ?End of Session Equipment Utilized During Treatment: Gait belt ?Activity Tolerance: Patient tolerated treatment  well ?Patient left: in chair;with call bell/phone within reach;with chair alarm set ?Nurse Communication: Mobility status ?PT Visit Diagnosis: Muscle weakness (generalized) (M62.81);Ataxic gait (R26.0) ?  ? ? ?Time: 2505-3976 ?PT Time Calculation (min) (ACUTE ONLY): 29 min ? ?Charges:  $Therapeutic Activity: 8-22 mins          ?          ? ?Philomena Doheny PT 02/06/2022  ?Acute Rehabilitation Services ?Pager [phone] ?Office [phone] ? ? ?

## 2022-02-06 NOTE — Progress Notes (Signed)
Occupational Therapy Progress Note ? ?Prior to session RN reports patient had assisted lowering to floor with nurse tech as patient's knee buckled. Patient needing +2 mod assist to power up to standing with cues for hand placement. Use of stedy to transfer patient first to bedside commode then recliner. Patient's R knee buckling against frame of stedy and needing mod A for safety due to posterior leaning. Patient total A for peri care after bowel movement, heavily reliant on UE support. Patient having difficulty trying to void, states has had this problem on and off "for a while." Acute OT to follow. ? ? ? 02/06/22 1400  ?OT Visit Information  ?Last OT Received On 02/06/22  ?Assistance Needed +2  ?PT/OT/SLP Co-Evaluation/Treatment Yes  ?Reason for Co-Treatment To address functional/ADL transfers;For patient/therapist safety  ?PT goals addressed during session Mobility/safety with mobility  ?OT goals addressed during session ADL's and self-care  ?History of Present Illness 61 yo male presents to [facility] on 3/14 with progressive weakness over the past several weeks, falls. 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 Julien Nordmann and Mickeal Skinner, DDD, EtOH abuse, HTN and tobacco use disorder.  ?Precautions  ?Precautions Fall  ?Precaution Comments truncal and limb ataxia; assisted fall to floor this morning with NT when RLE buckled in standing  ?Restrictions  ?Weight Bearing Restrictions No  ?Pain Assessment  ?Pain Assessment No/denies pain  ?Cognition  ?Arousal/Alertness Awake/alert  ?Behavior During Therapy [facility] for tasks assessed/performed  ?Overall Cognitive Status Impaired/Different from baseline  ?Area of Impairment Safety/judgement;Problem solving;Following commands  ?Following Commands Follows one step commands with increased time;Follows multi-step commands inconsistently  ?Safety/Judgement Decreased awareness of deficits;Decreased awareness of safety  ?Problem Solving Slow  processing;Decreased initiation;Requires verbal cues;Requires tactile cues  ?General Comments Patient needing directions restated for accurate follow through  ?ADL  ?Overall ADL's  Needs assistance/impaired  ?Toilet Transfer Total assistance;Moderate assistance;+2 for physical assistance;+2 for safety/equipment;BSC/3in1  ?Toilet Transfer Details (indicate cue type and reason) Patient needing mod +2 to power up to standing due to R LE buckling and cues for hand placement. Utilized Denna Haggard to complete transfer first to bedside commode then to recliner chair.  ?Toileting- Clothing Manipulation and Hygiene Total assistance;Sit to/from stand  ?Toileting - Clothing Manipulation Details (indicate cue type and reason) Patient heavily reliant on UE support with posterior lean and R LE buckling in standing. Total A for perianal care after bowel movement  ?Bed Mobility  ?Overal bed mobility Needs Assistance  ?Bed Mobility Supine to Sit  ?Supine to sit Supervision  ?General bed mobility comments increased time, VCs for technique  ?Balance  ?Overall balance assessment Needs assistance;History of Falls  ?Sitting-balance support No upper extremity supported;Feet supported  ?Sitting balance-Leahy Scale Poor  ?Sitting balance - Comments Patient with posterior loss of balance hitting back of neck on bed rail  ?Postural control Posterior lean;Right lateral lean  ?Standing balance support Bilateral upper extremity supported;During functional activity  ?Standing balance-Leahy Scale Poor  ?OT - End of Session  ?Equipment Utilized During Treatment Gait belt ?(sara stedy)  ?Activity Tolerance Patient tolerated treatment well  ?Patient left in chair;with call bell/phone within reach;with chair alarm set  ?Nurse Communication Mobility status ?(pt had BM)  ?OT Assessment/Plan  ?OT Plan Discharge plan needs to be updated  ?OT Visit Diagnosis Unsteadiness on feet (R26.81);Other abnormalities of gait and mobility (R26.89);Repeated falls  (R29.6)  ?OT Frequency (ACUTE ONLY) Min 2X/week  ?Follow Up Recommendations Acute inpatient rehab (3hours/day)  ?  Assistance recommended at discharge Frequent or constant Supervision/Assistance  ?Patient can return home with the following A lot of help with walking and/or transfers;A lot of help with bathing/dressing/bathroom;Assistance with cooking/housework;Direct supervision/assist for medications management;Direct supervision/assist for financial management;Assist for transportation;Help with stairs or ramp for entrance  ?OT Equipment Other (comment) ?(defer next venue)  ?AM-PAC OT "6 Clicks" Daily Activity Outcome Measure (Version 2)  ?Help from another person eating meals? 4  ?Help from another person taking care of personal grooming? 3  ?Help from another person toileting, which includes using toliet, bedpan, or urinal? 1  ?Help from another person bathing (including washing, rinsing, drying)? 2  ?Help from another person to put on and taking off regular upper body clothing? 3  ?Help from another person to put on and taking off regular lower body clothing? 1  ?6 Click Score 14  ?Progressive Mobility  ?What is the highest level of mobility based on the progressive mobility assessment? Level 3 (Stands with assist) - Balance while standing  and cannot march in place  ?Activity Transferred to/from [facility]  ?OT Goal Progression  ?Progress towards OT goals Not progressing toward goals - comment ?(patient with R LE buckling and fall with nurse tech)  ?Acute Rehab OT Goals  ?Patient Stated Goal urinate  ?OT Goal Formulation With patient  ?Time For Goal Achievement 02/18/22  ?Potential to [place]  ?ADL Goals  ?Pt Will Perform Lower Body Dressing with supervision;sit to/from stand;sitting/lateral leans  ?Pt Will Perform Toileting - Clothing Manipulation and hygiene with supervision;sitting/lateral leans;sit to/from stand  ?Additional ADL Goal #1 Patient will perform 10 min functional activity or exercise activity  as evidence of improving activity tolerance  ?OT Time Calculation  ?OT Start Time (ACUTE ONLY) 1143  ?OT Stop Time (ACUTE ONLY) 1212  ?OT Time Calculation (min) 29 min  ?OT General Charges  ?$OT Visit 1 Visit  ?OT Treatments  ?$Self Care/Home Management  8-22 mins  ? ?Delbert Phenix OT ?OT pager: [phone] ? ?

## 2022-02-06 NOTE — Progress Notes (Signed)
?PROGRESS NOTE ? ?Richard Santos:[id number] DOB: 09-Sep-1961  ? ?PCP: Curt Bears, MD ? ?Patient is from: Home.  Lives with his wife.  Uses walker at baseline.   ? ?DOA: 02/03/2022 LOS: 2 ? ?Chief complaints ?Chief Complaint  ?Patient presents with  ? Weakness  ? Fall  ? Urinary Retention  ?  ? 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 Julien Nordmann and Mickeal Skinner, DDD, EtOH abuse, HTN and tobacco use disorder presenting with lower extremity weakness, right> left and recurrent falls, and admitted for the same.  He was on steroid taper for his brain mets and went from 2 mg a day to 1 mg a day a week prior to presentation.  He had about 4 episodes of fall the day of presentation but no LOC or significant traumatic injury.  Orthostatic vitals positive. ? ?CT head without contrast with no evidence of acute infarct or hemorrhage.  He was started on IV Decadron 2 mg twice daily.  Neurooncology recommended MRI brain which showed significant interval decrease in the size of previously noted enhancing lesions in the brain.  Evaluated by oncology and had CT chest/abdomen/pelvis that showed new 5 to 10% left-sided pneumothorax and new acute/subacute right anterior ninth and 10th ribs fracture and spondylosis and DDD at L5-S1 level contributing to right foraminal impingement.   ? ?Two view CXR on 3/16 with no evidence of pneumothorax.  Patient remains stable from respiratory standpoint. ? ?Therapy recommended CIR.   ? ?Subjective: ?Seen and examined earlier this morning.  No major events overnight of this morning.  He was eased down by nursing take when he got up to use bedside commode.  No fall or trauma.  He denies dizziness but orthostatic vitals positive. ? ?Objective: ?Vitals:  ? 02/06/22 0827 02/06/22 0831 02/06/22 4193 02/06/22 1222  ?BP:   103/75 111/86  ?Pulse:   95 93  ?Resp:   18 18  ?Temp:   97.9 ?F (36.6 ?C) 97.6 ?F (36.4 ?C)  ?TempSrc:   Oral Oral  ?SpO2:  99% 100% 99% 100%  ?Weight:      ?Height:      ? ? ?Examination: ? ?GENERAL: No apparent distress.  Nontoxic. ?HEENT: MMM.  Vision and hearing grossly intact.  ?NECK: Supple.  No apparent JVD.  ?RESP:  No IWOB.  Fair aeration bilaterally. ?CVS:  RRR. Heart sounds normal.  ?ABD/GI/GU: BS+. Abd soft, NTND.  ?MSK/EXT:  Moves extremities.  Significant muscle mass and subcu fat loss. ?SKIN: no apparent skin lesion or wound ?NEURO: Awake and alert. Oriented appropriately.  Motor 4/5 in RLE with hip flexion.  5/5 elsewhere. ?PSYCH: Calm. Normal affect.  ? ?Procedures:  ?None ? ?Microbiology summarized: ?COVID-19 and influenza PCR nonreactive. ? ?Assessment and Plan: ?* Generalized weakness ?Reportedly, weakness worsened when decadron was reduced to 1 mg a day a week ago.  Weakness is more pronounced in RLE.  Some foraminal impingement at L5-S1 on CT abdomen and pelvis.  PT concerned about right knee instability.  Orthostatic vitals positive but patient denied dizziness.  CT head without contrast without acute finding.  Patient has SCLC with brain mets.  Started on IV Decadron 2 mg twice daily.  Repeat MRI brain with decrease in size of tumor burden.  ?-We will check right knee x-ray.  He may need knee brace ?-Apply TED hose ?-Continue p.o. Decadron 2 mg twice daily ?-Continue home Depakote for seizure prophylaxis. ?-Stop tramadol on discharge-for  lower his seizure threshold. ?-Continue PT/OT-recommended CIR. ?-Fall precautions ? ?Orthostatic hypotension ?Patient dose is down to the floor by nursing take when he got up to use bedside commode.  He attributed this to his right leg.  Orthostatic vitals positive but he denies dizziness. ?-Fall precaution and TED hose as above ?-Encourage oral hydration ?-Elevate head of bed ? ?Recurrent falls ?Could be due to generalized weakness, alcohol or adrenal insufficiency from chronic steroid use ?-See generalized weakness above ? ?Metastatic cancer to brain [facility]) ?Decadron as  above. ? ?Small cell lung cancer, left upper lobe ([place]) ?Followed by Dr. Julien Nordmann for [facility] and Dr. Mickeal Skinner for brain mets.  MRI brain with interval decrease in brain mets.  CT chest/abdomen/pelvis with stable postradiation change and a sclerotic metastatic disease ?-Appreciate input by oncology and radiation oncology. ? ?Pneumothorax ?5 to 10% left-sided pneumothorax noted on CT chest.  Patient has no respiratory distress.  Repeat CXR without pneumothorax. ?-Monitor respiratory status ? ?Multiple rib fractures ?CT chest showed acute/subacute anterior right ninth and 10th rib fracture.  Patient is not in pain. ?-Supportive care ? ?Pancytopenia ([place]) ?Relatively stable.  There could be some element of hemodilution. ?-Continue monitoring ? ?Alcohol use ?Continues to drink 3 beers a day. Used to drink more.  ?-Continue CIWA protocol. ? ?Hyponatremia ?Recent Labs  ?Lab 02/03/22 ?1905 02/04/22 ?9983 02/05/22 ?3825 02/06/22 ?0457  ?NA 134* 135 130* 132*  ?-Continue monitoring ? ? ?Tobacco abuse ?Continues to smoke a few cigarettes a day ?Encourage cessation ?Nicotine patch if needed ? ?HTN (hypertension) ?Does not seem to be on medication for this. ?-Continue monitoring ? ?Hypomagnesemia ?Mg 1.7 ?-IV magnesium sulfate 2 g x 1 ? ?Protein-calorie malnutrition, severe ?Body mass index is 17.63 kg/m?Marland Kitchen  Has significant muscle mass and subcu fat loss. ?Consult dietitian ? ? ? ? ?DVT prophylaxis:  ?Place TED hose Start: 02/06/22 1332 ?enoxaparin (LOVENOX) injection 40 mg Start: 02/04/22 1000 ? ?Code Status: Full code ?Family Communication: Updated patient's wife over the phone on 3/16. ?Level of care: Med-Surg ?Status is: Inpatient ?The patient will remain inpatient because: Safe disposition/CIR ? ? ?Final disposition: CIR ? ?Consultants:  ?Neurooncology ?Oncology ? ?Sch Meds:  ?Scheduled Meds: ? aspirin EC  81 mg Oral Daily  ? Chlorhexidine Gluconate Cloth  6 each Topical Daily  ? dexamethasone  2 mg Oral Q12H  ? divalproex  250  mg Oral BID  ? enoxaparin (LOVENOX) injection  40 mg Subcutaneous K53Z  ? folic acid  1 mg Oral Daily  ? umeclidinium bromide  1 puff Inhalation Daily  ? And  ? mometasone-formoterol  2 puff Inhalation BID  ? multivitamin with minerals  1 tablet Oral Daily  ? thiamine  100 mg Oral Daily  ? Or  ? thiamine  100 mg Intravenous Daily  ? [START ON 02/07/2022] vitamin B-12  1,000 mcg Oral Daily  ? ?Continuous Infusions: ? magnesium sulfate bolus IVPB    ? ?PRN Meds:.acetaminophen **OR** acetaminophen, ipratropium-albuterol, LORazepam **OR** LORazepam, ondansetron **OR** ondansetron (ZOFRAN) IV, senna-docusate ? ?Antimicrobials: ?Anti-infectives (From admission, onward)  ? ? None  ? ?  ? ? ? ?I have personally reviewed the following labs and images: ?CBC: ?Recent Labs  ?Lab 02/03/22 ?1905 02/04/22 ?7673 02/05/22 ?4193 02/06/22 ?0457  ?WBC 5.1 3.9* 3.4* 3.9*  ?NEUTROABS 4.0  --   --   --   ?HGB 10.6* 10.1* 11.1* 10.2*  ?HCT 30.3* 29.3* 31.4* 29.1*  ?MCV 104.5* 105.0* 104.0* 104.7*  ?PLT 146* 131* 140* 152  ? ?  BMP &GFR ?Recent Labs  ?Lab 02/03/22 ?1905 02/04/22 ?6314 02/05/22 ?9702 02/06/22 ?0457  ?NA 134* 135 130* 132*  ?K 3.9 3.9 4.2 3.8  ?CL 98 100 97* 98  ?CO2 26 27 25 26   ?GLUCOSE 103* 88 109* 102*  ?BUN 31* 24* 22* 24*  ?CREATININE 1.23 0.85 0.81 0.74  ?CALCIUM 9.1 9.0 9.1 8.6*  ?MG  --   --  1.5* 1.7  ?PHOS  --   --  4.2 4.0  ? ?Estimated Creatinine Clearance: 81.9 mL/min (by C-G formula based on SCr of 0.74 mg/dL). ?Liver & Pancreas: ?Recent Labs  ?Lab 02/03/22 ?1905 02/05/22 ?6378 02/06/22 ?0457  ?AST 27  --   --   ?ALT 28  --   --   ?ALKPHOS 40  --   --   ?BILITOT 0.5  --   --   ?PROT 6.7  --   --   ?ALBUMIN 3.7 3.5 3.3*  ? ?No results for input(s): LIPASE, AMYLASE in the last 168 hours. ?No results for input(s): AMMONIA in the last 168 hours. ?Diabetic: ?No results for input(s): HGBA1C in the last 72 hours. ?No results for input(s): GLUCAP in the last 168 hours. ?Cardiac Enzymes: ?Recent Labs  ?Lab 02/06/22 ?5885   ?CKTOTAL 26*  ? ?No results for input(s): PROBNP in the last 8760 hours. ?Coagulation Profile: ?No results for input(s): INR, PROTIME in the last 168 hours. ?Thyroid Function Tests: ?No results for input(s): TSH,

## 2022-02-06 NOTE — Progress Notes (Signed)
?   02/06/22 1000  ?What Happened  ?Was fall witnessed? Yes  ?Who witnessed fall? Luna Fuse  ?Patients activity before fall bathroom-assisted  ?Point of contact buttocks  ?Was patient injured? No  ?Follow Up  ?MD notified Dr Cyndia Skeeters  ?Time MD notified [phone]  ?Family notified Yes - comment ?[name] (spouse))  ?Time family notified [phone]  ?Additional tests Yes-comment ?(orthostatic VS)  ?Adult Fall Risk Assessment  ?Risk Factor Category (scoring not indicated) High fall risk per protocol (document High fall risk);Fall has occurred during this admission (document High fall risk)  ?Patient Fall Risk Level High fall risk  ?Adult Fall Risk Interventions  ?Required Bundle Interventions *See Row Information* High fall risk - low, moderate, and high requirements implemented  ?Additional Interventions Lap belt while in chair/wheelchair (Rehab only);Use of appropriate toileting equipment (bedpan, BSC, etc.)  ?Screening for Fall Injury Risk (To be completed on HIGH fall risk patients) - Assessing Need for Floor Mats  ?Risk For Fall Injury- Criteria for Floor Mats Admitted as a result of a fall  ?Will Implement Floor Mats Yes  ?Pain Assessment  ?Pain Scale 0-10  ?Pain Score 0  ? ? ?

## 2022-02-07 DIAGNOSIS — R5381 Other malaise: Secondary | ICD-10-CM | POA: Diagnosis not present

## 2022-02-07 LAB — CBC WITH DIFFERENTIAL/PLATELET
Abs Immature Granulocytes: 0.05 10*3/uL (ref 0.00–0.07)
Basophils Absolute: 0 10*3/uL (ref 0.0–0.1)
Basophils Relative: 0 %
Eosinophils Absolute: 0 10*3/uL (ref 0.0–0.5)
Eosinophils Relative: 0 %
HCT: 30.1 % — ABNORMAL LOW (ref 39.0–52.0)
Hemoglobin: 10.4 g/dL — ABNORMAL LOW (ref 13.0–17.0)
Immature Granulocytes: 1 %
Lymphocytes Relative: 15 %
Lymphs Abs: 0.6 10*3/uL — ABNORMAL LOW (ref 0.7–4.0)
MCH: 36 pg — ABNORMAL HIGH (ref 26.0–34.0)
MCHC: 34.6 g/dL (ref 30.0–36.0)
MCV: 104.2 fL — ABNORMAL HIGH (ref 80.0–100.0)
Monocytes Absolute: 0.4 10*3/uL (ref 0.1–1.0)
Monocytes Relative: 9 %
Neutro Abs: 3.1 10*3/uL (ref 1.7–7.7)
Neutrophils Relative %: 75 %
Platelets: 146 10*3/uL — ABNORMAL LOW (ref 150–400)
RBC: 2.89 MIL/uL — ABNORMAL LOW (ref 4.22–5.81)
RDW: 13.2 % (ref 11.5–15.5)
WBC: 4.1 10*3/uL (ref 4.0–10.5)
nRBC: 0 % (ref 0.0–0.2)

## 2022-02-07 LAB — COMPREHENSIVE METABOLIC PANEL
ALT: 24 U/L (ref 0–44)
AST: 25 U/L (ref 15–41)
Albumin: 3.3 g/dL — ABNORMAL LOW (ref 3.5–5.0)
Alkaline Phosphatase: 35 U/L — ABNORMAL LOW (ref 38–126)
Anion gap: 7 (ref 5–15)
BUN: 22 mg/dL — ABNORMAL HIGH (ref 6–20)
CO2: 27 mmol/L (ref 22–32)
Calcium: 8.8 mg/dL — ABNORMAL LOW (ref 8.9–10.3)
Chloride: 100 mmol/L (ref 98–111)
Creatinine, Ser: 0.81 mg/dL (ref 0.61–1.24)
GFR, Estimated: >60 mL/min (ref >60)
Glucose, Bld: 133 mg/dL — ABNORMAL HIGH (ref 70–99)
Potassium: 3.7 mmol/L (ref 3.5–5.1)
Sodium: 134 mmol/L — ABNORMAL LOW (ref 135–145)
Total Bilirubin: 0.4 mg/dL (ref 0.3–1.2)
Total Protein: 5.5 g/dL — ABNORMAL LOW (ref 6.5–8.1)

## 2022-02-07 LAB — MAGNESIUM: Magnesium: 1.7 mg/dL (ref 1.7–2.4)

## 2022-02-07 MED ORDER — SODIUM CHLORIDE 0.9 % IV SOLN: INTRAVENOUS | Status: DC

## 2022-02-07 NOTE — Progress Notes (Signed)
Inpatient Rehabilitation Admission Medication Review by a Pharmacist ? ?A complete drug regimen review was completed for this patient to identify any potential clinically significant medication issues. ? ?High Risk Drug Classes Is patient taking? Indication by Medication  ?Antipsychotic Yes, as an intravenous medication (suppository and PO) Prochlorperazine - nausea  ?Anticoagulant Yes Enoxaparin- DVT ppx  ?Antibiotic No   ?Opioid No   ?Antiplatelet Yes Aspirin- Atherosclerotic cardiovascular disease   ?Hypoglycemics/insulin No   ?Vasoactive Medication No   ?Chemotherapy No   ?Other Yes Depakote - seizure ppx ?Trazadone - PRN sleep  ? ? ? ?Type of Medication Issue Identified Description of Issue Recommendation(s)  ?Drug Interaction(s) (clinically significant) ?    ?Duplicate Therapy ?    ?Allergy ?    ?No Medication Administration End Date ?    ?Incorrect Dose ?    ?Additional Drug Therapy Needed ? Bethanecol ? ? ? ?Nicotine replacement therapy ? ? ?Buspar ? ? ? ?Lovastatin Per Dr. Juliann Pares discharge note, start bethanecol for malignant neoplasm of prostate ? ?H/o tobacco abuse. Consider starting if patient has cravings ? ?Prior to admission medication, patient has history of anxiety. Initiate if appropriate ? ?Prior to admission medication. Patient has h/o HLD. Consider restarting  ?Significant med changes from prior encounter (inform family/care partners about these prior to discharge).    ?Other ?    ? ? ?Clinically significant medication issues were identified that warrant physician communication and completion of prescribed/recommended actions by midnight of the next day:  No ? ? ? ?Pharmacist comments: None ? ?Time spent performing this drug regimen review (minutes):  30 ? ?Joseph Art, Pharm.D. ?PGY-1 Pharmacy Resident ?[phone] ?02/07/2022 10:20 AM ? ?

## 2022-02-07 NOTE — Evaluation (Signed)
Physical Therapy Assessment and Plan ? ?Patient Details  ?Name: Richard Santos ?MRN: [id number] ?Date of Birth: 12-24-1960 ? ?PT Diagnosis: Abnormal posture, Abnormality of gait, Ataxia, Cognitive deficits, Coordination disorder, Difficulty walking, Impaired cognition, Impaired sensation, Muscle weakness, and Pain in abdomen ?Rehab Potential: Good ?ELOS: ~2.5-3 weeks  ? ?Today's Date: 02/07/2022 ?PT Individual Time: 8242-3536 ?PT Individual Time Calculation (min): 68 min   ? ?Hospital Problem: Principal Problem: ?  Debility ? ? ?Past Medical History:  ?Past Medical History:  ?Diagnosis Date  ? Allergic rhinitis   ? Allergy   ? Anxiety   ? Chronic low back pain   ? DDD (degenerative disc disease), lumbar   ? ED (erectile dysfunction)   ? Finger injury   ? cut pads off 3rd and 4th finger left hand/due to lawn mower accident  ? GERD (gastroesophageal reflux disease)   ? Heart murmur   ? as child only-   ? History of kidney stones   ? HTN (hypertension) 08/20/2016  ? Hyperlipidemia   ? Incomplete right bundle branch block   ? Prostate cancer [facility]) dx 10/02/14  ? stage T1c  ? Sigmoid diverticulosis   ? Small cell lung cancer ([place]) 03/2020  ? Wears glasses   ? ?Past Surgical History:  ?Past Surgical History:  ?Procedure Laterality Date  ? BRONCHIAL BIOPSY  04/12/2020  ? Procedure: BRONCHIAL BIOPSIES;  Surgeon: Marshell Garfinkel, MD;  Location: [place] [facility];  Service: Cardiopulmonary;;  ? BRONCHIAL BRUSHINGS  04/12/2020  ? Procedure: BRONCHIAL BRUSHINGS;  Surgeon: Marshell Garfinkel, MD;  Location: [place] [facility];  Service: Cardiopulmonary;;  ? BRONCHIAL NEEDLE ASPIRATION BIOPSY  04/12/2020  ? Procedure: BRONCHIAL NEEDLE ASPIRATION BIOPSIES;  Surgeon: Marshell Garfinkel, MD;  Location: [place];  Service: Cardiopulmonary;;  ? BRONCHIAL WASHINGS  04/12/2020  ? Procedure: BRONCHIAL WASHINGS;  Surgeon: Marshell Garfinkel, MD;  Location: [place] [facility];  Service: Cardiopulmonary;;  ? COLONOSCOPY    ? COLONOSCOPY W/ POLYPECTOMY  04-19-2012  ?  ENDOBRONCHIAL ULTRASOUND N/A 04/12/2020  ? Procedure: ENDOBRONCHIAL ULTRASOUND;  Surgeon: Marshell Garfinkel, MD;  Location: [place] [facility];  Service: Cardiopulmonary;  Laterality: N/A;  ? ESOPHAGOGASTRODUODENOSCOPY  08-05-2010  ? HEMOSTASIS CONTROL  04/12/2020  ? Procedure: HEMOSTASIS CONTROL;  Surgeon: Marshell Garfinkel, MD;  Location: [place] [facility];  Service: Cardiopulmonary;;  ? IR IMAGING GUIDED PORT INSERTION  05/31/2020  ? POLYPECTOMY    ? PROSTATE BIOPSY  10/02/14  ? RADIOACTIVE SEED IMPLANT N/A 01/30/2015  ? Procedure: RADIOACTIVE SEED IMPLANT;  Surgeon: Rana Snare, MD;  Location[facility];  Service: Urology;  Laterality: N/A;  ? UPPER GASTROINTESTINAL ENDOSCOPY  04/03/2021  ? VIDEO BRONCHOSCOPY N/A 04/12/2020  ? Procedure: VIDEO BRONCHOSCOPY WITHOUT FLUORO;  Surgeon: Marshell Garfinkel, MD;  Location: [place];  Service: Cardiopulmonary;  Laterality: N/A;  ? ? ?Assessment & Plan ?Clinical Impression: Patient is a 61 y.o. 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 He was admitted on 02/03/22 for work up and MRI brain done revealing significant decrease in size of enhancing lesions (in pons, bilateral temporal lobes, left periventricular white matter and left frontal lobe). Decadron increased to 2 mg IV BID. He was placed on CIWA protocol due to hx of daily ETOH (down from 49 drinks to 3 drinks/day?). CT abdomen/pelvis  done revealing new 5-10% left PTX, new acute/subacute right 9-10 rib FX, right foraminal impingement at L5/S1, stable appearance of stable left lung nodule and sclerotic metastatic disease involving thoracic spine and proximal femurs.  ?  ?Dr. Earlie Server consulted and recommended maintaining patient on 2 mg bid as he was feeling  better. He continues to have RLE weakness with tendency to buckle, needs increased time to process and follow one step commands, has orthostatic changes with standing attempts as well as  right lateral and posterior lean. CIR recommended due to functional decline. Patient transferred to CIR on 02/06/2022 .  ? ?Patient currently requires max assist with mobility secondary to muscle weakness, decreased cardiorespiratoy endurance, impaired timing and sequencing, unbalanced muscle activation, decreased coordination, and decreased motor planning, decreased attention, decreased awareness, decreased problem solving, decreased memory, and delayed processing, and decreased sitting balance, decreased standing balance, decreased postural control, and decreased balance strategies.  Prior to hospitalization, patient was independent  with mobility and lived with Spouse (wife, Richard Santos) in a House home.  Home access is 3Stairs to enter (to enter front or back - pt usually enters to through the back door). ? ?Patient will benefit from skilled PT intervention to maximize safe functional mobility, minimize fall risk, and decrease caregiver burden for planned discharge home with 24 hour assist.  Anticipate patient will benefit from follow up [facility] at discharge. ? ?PT - End of Session ?Activity Tolerance: Tolerates 30+ min activity with multiple rests ?Endurance Deficit: Yes ?Endurance Deficit Description: reports fatigue after performing only bed level LB dressing ?PT Assessment ?Rehab Potential (ACUTE/IP ONLY): Good ?PT Barriers to Discharge: Pending chemo/radiation;Lack of/limited family support;Home environment access/layout ?PT Patient demonstrates impairments in the following area(s): Balance;Safety;Edema;Sensory;Endurance;Skin Integrity;Motor;Nutrition;Pain ?PT Transfers Functional Problem(s): Bed Mobility;Bed to Chair;Car;Furniture ?PT Locomotion Functional Problem(s): Ambulation;Wheelchair Mobility;Stairs ?PT Plan ?PT Intensity:  Minimum of 1-2 x/day ,45 to 90 minutes ?PT Frequency: 5 out of 7 days ?PT Duration Estimated Length of Stay: ~2.5-3 weeks ?PT Treatment/Interventions: Ambulation/gait training;Community reintegration;DME/adaptive equipment instruction;Neuromuscular re-education;Psychosocial support;Stair training;UE/LE Strength taining/ROM;Wheelchair propulsion/positioning;Balance/vestibular training;Discharge planning;Pain management;Skin care/wound management;Therapeutic Activities;UE/LE Coordination activities;Cognitive remediation/compensation;Disease management/prevention;Patient/family education;Functional mobility training;Splinting/orthotics;Therapeutic Exercise;Visual/perceptual remediation/compensation ?PT Transfers Anticipated Outcome(s): CGA using LRAD ?PT Locomotion Anticipated Outcome(s): min assist using LRAD ?PT Recommendation ?Recommendations for Other Services: Therapeutic Recreation consult;Neuropsych consult ?Therapeutic Recreation Interventions: Stress management;Outing/community reintergration ?Follow Up Recommendations: Home health PT;24 hour supervision/assistance ?Patient destination: Home ?Equipment Recommended: To be determined ? ? ?PT Evaluation ?Precautions/Restrictions ?Precautions ?Precautions: Fall;Other (comment) ?Precaution Comments: truncal and BLE limb incoordination; absent R LE sensation with R knee buckle ?Restrictions ?Weight Bearing Restrictions: No ?Pain ?Pain Assessment ?Pain Scale: 0-10 ?Pain Score: 6  ?Pain Type: Chronic pain (started prior to hospitalization - pt estimates ~6 weeks) ?Pain Location: Other (Comment) (states "my stomach hurts a little bit" reports it is from "straining to get up" (gestures when he is going to get up OOB)) ?Pain Orientation: Other (Comment) (across the mid to lower abdomen) ?Pain Descriptors / Indicators: Aching ?Pain Onset: On-going ?Pain Intervention(s): Rest;Repositioned;Other (Comment) (educated on mobility techniques to decrease pain) ?Pain  Interference ?Pain Interference ?Pain Effect on Sleep: 2. Occasionally ?Pain Interference with Therapy Activities: 1. Rarely or not at all ?Pain Interference with Day-to-Day Activities: 2. Occasionally ?Home Living/Prio

## 2022-02-07 NOTE — Evaluation (Signed)
Occupational Therapy Assessment and Plan ? ?Patient Details  ?Name: Richard Santos ?MRN: [id number] ?Date of Birth: 03/19/1961 ? ?OT Diagnosis: cognitive deficits and muscle weakness (generalized) ?Rehab Potential: Rehab Potential (ACUTE ONLY): Good ?ELOS: 2-3 weeks  ? ?Today's Date: 02/07/2022 ?OT Individual Time: 3154-0086 ?OT Individual Time Calculation (min): 60 min    ? ?Hospital Problem: Principal Problem: ?  Debility ? ? ?Past Medical History:  ?Past Medical History:  ?Diagnosis Date  ? Allergic rhinitis   ? Allergy   ? Anxiety   ? Chronic low back pain   ? DDD (degenerative disc disease), lumbar   ? ED (erectile dysfunction)   ? Finger injury   ? cut pads off 3rd and 4th finger left hand/due to lawn mower accident  ? GERD (gastroesophageal reflux disease)   ? Heart murmur   ? as child only-   ? History of kidney stones   ? HTN (hypertension) 08/20/2016  ? Hyperlipidemia   ? Incomplete right bundle branch block   ? Prostate cancer [facility]) dx 10/02/14  ? stage T1c  ? Sigmoid diverticulosis   ? Small cell lung cancer ([place]) 03/2020  ? Wears glasses   ? ?Past Surgical History:  ?Past Surgical History:  ?Procedure Laterality Date  ? BRONCHIAL BIOPSY  04/12/2020  ? Procedure: BRONCHIAL BIOPSIES;  Surgeon: Marshell Garfinkel, MD;  Location: [place] ENDOSCOPY;  Service: Cardiopulmonary;;  ? BRONCHIAL BRUSHINGS  04/12/2020  ? Procedure: BRONCHIAL BRUSHINGS;  Surgeon: Marshell Garfinkel, MD;  Location: [place] ENDOSCOPY;  Service: Cardiopulmonary;;  ? BRONCHIAL NEEDLE ASPIRATION BIOPSY  04/12/2020  ? Procedure: BRONCHIAL NEEDLE ASPIRATION BIOPSIES;  Surgeon: Marshell Garfinkel, MD;  Location: [place];  Service: Cardiopulmonary;;  ? BRONCHIAL WASHINGS  04/12/2020  ? Procedure: BRONCHIAL WASHINGS;  Surgeon: Marshell Garfinkel, MD;  Location: [place] ENDOSCOPY;  Service: Cardiopulmonary;;  ? COLONOSCOPY    ? COLONOSCOPY W/ POLYPECTOMY  04-19-2012  ? ENDOBRONCHIAL ULTRASOUND N/A 04/12/2020  ? Procedure: ENDOBRONCHIAL ULTRASOUND;  Surgeon: Marshell Garfinkel,  MD;  Location: [place] ENDOSCOPY;  Service: Cardiopulmonary;  Laterality: N/A;  ? ESOPHAGOGASTRODUODENOSCOPY  08-05-2010  ? HEMOSTASIS CONTROL  04/12/2020  ? Procedure: HEMOSTASIS CONTROL;  Surgeon: Marshell Garfinkel, MD;  Location: [place] ENDOSCOPY;  Service: Cardiopulmonary;;  ? IR IMAGING GUIDED PORT INSERTION  05/31/2020  ? POLYPECTOMY    ? PROSTATE BIOPSY  10/02/14  ? RADIOACTIVE SEED IMPLANT N/A 01/30/2015  ? Procedure: RADIOACTIVE SEED IMPLANT;  Surgeon: Rana Snare, MD;  Location: [facility];  Service: Urology;  Laterality: N/A;  ? UPPER GASTROINTESTINAL ENDOSCOPY  04/03/2021  ? VIDEO BRONCHOSCOPY N/A 04/12/2020  ? Procedure: VIDEO BRONCHOSCOPY WITHOUT FLUORO;  Surgeon: Marshell Garfinkel, MD;  Location: [place];  Service: Cardiopulmonary;  Laterality: N/A;  ? ? ?Assessment & Plan ?Clinical Impression: Patient is a 61 y.o. 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 He was admitted on 02/03/22 for work up and MRI brain done revealing significant decrease in size of enhancing lesions (in pons, bilateral temporal lobes, left periventricular white matter and left frontal lobe). Decadron increased to 2 mg IV BID. He was placed on CIWA protocol due to hx of daily ETOH (down from 49 drinks to 3 drinks/day?). CT abdomen/pelvis done revealing new 5-10% left PTX, new acute/subacute right 9-10 rib  FX, right foraminal impingement at L5/S1, stable appearance of stable left lung nodule and sclerotic metastatic disease involving thoracic spine and proximal femurs.  ?  ?Dr. Earlie Server consulted and recommended maintaining patient on 2 mg bid as he was feeling better. He continues to have RLE weakness with tendency to buckle, needs increased time to process and  follow one step commands, has orthostatic changes with standing attempts as well as  right lateral and posterior lean. CIR recommended due to functional decline. Patient transferred to CIR on 02/06/2022 .  ?  ?Patient currently requires max assist with mobility secondary to muscle weakness, decreased cardiorespiratoy endurance, impaired timing and sequencing, unbalanced muscle activation, decreased coordination, and decreased motor planning, decreased attention, decreased awareness, decreased problem solving, decreased memory, and delayed processing, and decreased sitting balance, decreased standing balance, decreased postural control, and decreased balance strategies.  Prior to hospitalization, patient was independent  with mobility and lived with Spouse (wife, [name]) in a House home.  Home access is 3Stairs to enter (to enter front or back - pt usually enters to through the back door). ? ?Patient currently requires mod with basic self-care skills secondary to muscle weakness, decreased cardiorespiratoy endurance, ataxia, decreased coordination, and decreased motor planning, decreased attention, decreased problem solving, and decreased memory, and decreased standing balance, decreased postural control, and decreased balance strategies.  Prior to hospitalization, patient could complete BADLs with modified independent . ? ?Patient will benefit from skilled intervention to increase independence with basic self-care skills prior to discharge home with care partner.  Anticipate patient will require 24 hour supervision and minimal physical assistance and follow up home health. ? ?OT - End of Session ?Activity Tolerance: Decreased this session ?Endurance Deficit: Yes ?OT Assessment ?Rehab Potential (ACUTE ONLY): Good ?OT Patient demonstrates impairments in the following area(s): Balance;Safety;Sensory;Behavior;Cognition;Skin Integrity;Edema;Endurance;Motor;Nutrition;Pain;Perception ?OT Basic ADL's Functional Problem(s):  Grooming;Bathing;Dressing;Toileting ?OT Transfers Functional Problem(s): Toilet;Tub/Shower ?OT Additional Impairment(s): None ?OT Plan ?OT Intensity: Minimum of 1-2 x/day, 45 to 90 minutes ?OT Frequency: 5 out of 7 days ?OT Duration/Estimated Length of Stay: 2-3 weeks ?OT Treatment/Interventions: Balance/vestibular training;Disease mangement/prevention;Neuromuscular re-education;Self Care/advanced ADL retraining;Therapeutic Exercise;Wheelchair propulsion/positioning;DME/adaptive equipment instruction;Cognitive remediation/compensation;Pain management;UE/LE Strength taining/ROM;Skin care/wound managment;Community reintegration;Functional electrical stimulation;Patient/family education;Splinting/orthotics;UE/LE Coordination activities;Discharge planning;Functional mobility training;Psychosocial support;Therapeutic Activities;Visual/perceptual remediation/compensation ?OT Self Feeding Anticipated Outcome(s): N/A ?OT Basic Self-Care Anticipated Outcome(s): Supervision-min A ?OT Toileting Anticipated Outcome(s): Min A ?OT Bathroom Transfers Anticipated Outcome(s): Supervision ?OT Recommendation ?Patient destination: Home ?Follow Up Recommendations: Other (comment) (TBD) ?Equipment Recommended: To be determined ? ? ?OT Evaluation ?Precautions/Restrictions  ?Precautions ?Precautions: Fall ?Precaution Comments: truncal and BLE limb incoordination; absent R LE sensation with R knee buckle ?Restrictions ?Weight Bearing Restrictions: No ?General ?Chart Reviewed: Yes ?Vital Signs ?Therapy Vitals ?Temp: 98.9 ?F (37.2 ?C) ?Temp Source: Oral ?Pulse Rate: 90 ?Resp: 18 ?BP: 115/85 ?Patient Position (if appropriate): Sitting ?Oxygen Therapy ?SpO2: 100 % ?O2 Device: Room Air ?Pain ?Pain Assessment ?Pain Scale: 0-10 ?Pain Score: 0-No pain ?Pain Type: Chronic pain ?Pain Location: Back ?Pain Orientation: Other (Comment) (across the mid to lower abdomen) ?Pain Descriptors / Indicators: Aching ?Pain Onset: On-going ?Pain Intervention(s):  Medication (See eMAR) ?Home Living/Prior Functioning ?Home Living ?Family/patient expects to be discharged to:: Private residence ?Living Arrangements: Spouse/significant other ?Available Help at Discharge

## 2022-02-07 NOTE — Evaluation (Signed)
Speech Language Pathology Assessment and Plan ? ?Patient Details  ?Name: Richard Santos ?MRN: [id number] ?Date of Birth: 22-Nov-1961 ? ?SLP Diagnosis: Cognitive Impairments  ?Rehab Potential: Good ?ELOS: 2.5-3 weeks ? ?Today's Date: 02/07/2022 ?SLP Individual Time: 0800-0900 ?SLP Individual Time Calculation (min): 60 min ? ?Hospital Problem: Principal Problem: ?  Debility ? ?Past Medical History:  ?Past Medical History:  ?Diagnosis Date  ? Allergic rhinitis   ? Allergy   ? Anxiety   ? Chronic low back pain   ? DDD (degenerative disc disease), lumbar   ? ED (erectile dysfunction)   ? Finger injury   ? cut pads off 3rd and 4th finger left hand/due to lawn mower accident  ? GERD (gastroesophageal reflux disease)   ? Heart murmur   ? as child only-   ? History of kidney stones   ? HTN (hypertension) 08/20/2016  ? Hyperlipidemia   ? Incomplete right bundle branch block   ? Prostate cancer [facility]) dx 10/02/14  ? stage T1c  ? Sigmoid diverticulosis   ? 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 Wears glasses   ? ?Past Surgical History:  ?Past Surgical History:  ?Procedure Laterality Date  ? BRONCHIAL BIOPSY  04/12/2020  ? Procedure: BRONCHIAL BIOPSIES;  Surgeon: Marshell Garfinkel, MD;  Location: [place] ENDOSCOPY;  Service: Cardiopulmonary;;  ? BRONCHIAL BRUSHINGS  04/12/2020  ? Procedure: BRONCHIAL BRUSHINGS;  Surgeon: Marshell Garfinkel, MD;  Location: [place] ENDOSCOPY;  Service: Cardiopulmonary;;  ? BRONCHIAL NEEDLE ASPIRATION BIOPSY  04/12/2020  ? Procedure: BRONCHIAL NEEDLE ASPIRATION BIOPSIES;  Surgeon: Marshell Garfinkel, MD;  Location: [place];  Service: Cardiopulmonary;;  ? BRONCHIAL WASHINGS  04/12/2020  ? Procedure: BRONCHIAL WASHINGS;  Surgeon: Marshell Garfinkel, MD;  Location: [place] ENDOSCOPY;  Service: Cardiopulmonary;;  ? COLONOSCOPY    ? COLONOSCOPY W/ POLYPECTOMY  04-19-2012  ? ENDOBRONCHIAL ULTRASOUND N/A 04/12/2020  ? Procedure: ENDOBRONCHIAL ULTRASOUND;  Surgeon: Marshell Garfinkel, MD;  Location: [place] ENDOSCOPY;  Service: Cardiopulmonary;   Laterality: N/A;  ? ESOPHAGOGASTRODUODENOSCOPY  08-05-2010  ? HEMOSTASIS CONTROL  04/12/2020  ? Procedure: HEMOSTASIS CONTROL;  Surgeon: Marshell Garfinkel, MD;  Location: [place] ENDOSCOPY;  Service: Cardiopulmonary;;  ? IR IMAGING GUIDED PORT INSERTION  05/31/2020  ? POLYPECTOMY    ? PROSTATE BIOPSY  10/02/14  ? RADIOACTIVE SEED IMPLANT N/A 01/30/2015  ? Procedure: RADIOACTIVE SEED IMPLANT;  Surgeon: Rana Snare, MD;  Location: [facility];  Service: Urology;  Laterality: N/A;  ? UPPER GASTROINTESTINAL ENDOSCOPY  04/03/2021  ? VIDEO BRONCHOSCOPY N/A 04/12/2020  ? Procedure: VIDEO BRONCHOSCOPY WITHOUT FLUORO;  Surgeon: Marshell Garfinkel, MD;  Location: [place];  Service: Cardiopulmonary;  Laterality: N/A;  ? ? ?Assessment / Plan / Recommendation ?Clinical Impression  Patient is a 61 y.o. 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 Pt recently had whole brain radiation last month.  For the last 2 days he has had increased weakness and decreased ability to walk with increased weakness in the right leg and he has been more unsteady and dragging his right leg. He has had multiple falls over the last few days. Patient transferred to CIR on 02/06/2022. ? ?Pt presents with mild-mod cognitive impairment as evidenced by SLUMS score of 11/30, primary deficits in all areas of memory (immediate, delayed, working and procedural). Pt did demonstrate decreased sustained attention during evaluation impacting generative naming, digit manipulation and encoding phase of memory.  Pt demonstrates strengths in orientation, mathematics, simple problem solving and awareness of physical impairments at this time. Expressive and receptive language judged to be WNL. Pt observed at end of breakfast meal and is tolerating a regular/thin diet with no apparent or reported  difficulty. Pt reports desire to "feel like myself again" in term of cognitive function and wants to reduce risk of falls at home. Wife is home with patient and assists with iADLs. Pt will benefit from skilled ST with focus on cognitive goals to increase safety and independence with daily routine at discharge.  ?  ?Skilled Therapeutic Interventions          Pt participating in Cooper Status Examination (SLUMS) as well as further non-standardized assessments of speech, language and cognitive. Please see above. ?  ?SLP Assessment ? Patient will need skilled Speech Lanaguage Pathology Services during CIR admission  ?  ?Recommendations ? Postural Changes and/or Swallow Maneuvers: Seated upright 90 degrees ?Oral Care Recommendations: Oral care BID ?Patient destination: Home ?Follow up Recommendations: 24 hour supervision/assistance ?Equipment Recommended: None recommended by SLP  ?  ?SLP Frequency 3 to 5 out of 7 days   ?SLP Duration ? ?SLP Intensity ? ?SLP Treatment/Interventions  2.5-3 weeks ? ?Minumum of 1-2 x/day, 30 to 90 minutes ? ?Cognitive remediation/compensation;Internal/external aids;Therapeutic Activities;Patient/family education;Functional tasks;Cueing hierarchy   ? ?Pain ?Pain Assessment ?Pain Scale: 0-10 ?Pain Score: 0-No pain ? ?Prior Functioning ?Cognitive/Linguistic Baseline: Baseline deficits ?Baseline deficit details: cognitive impairments following cancer mets to brain and whole brain radiation ?Type of Home: House ? Lives With: Spouse ?Available Help at Discharge: Family;Available 24 hours/day ?Vocation: On disability ? ?SLP Evaluation ?Cognition ?Overall Cognitive Status: Impaired/Different from baseline ?Arousal/Alertness: Awake/alert ?Orientation Level: Oriented X4 ?Year: 2023 ?Month: March ?Day of Week: Incorrect ?Attention: Focused;Sustained;Selective ?Focused Attention: Appears intact ?Sustained Attention: Impaired ?Sustained Attention Impairment: Verbal basic;Functional  basic ?Selective Attention: Impaired ?Selective Attention Impairment: Verbal basic;Functional basic ?Memory: Impaired ?Memory Impairment: Decreased short term memory;Decreased recall of new information;Storage deficit;Retrieval deficit ?Decreased Short Term Memory: Verbal basic;Functional basic ?Awareness: Appears intact ?Problem Solving: Impaired ?Problem Solving Impairment: Verbal complex ?Safety/Judgment: Appears intact  ?Comprehension ?Auditory Comprehension ?Overall Auditory Comprehension: Appears within functional limits for tasks assessed ?Yes/No Questions: Within Functional Limits ?Commands: Within Functional Limits ?Expression ?Expression ?Primary Mode of Expression: Verbal ?Verbal Expression ?Overall Verbal Expression: Appears within functional limits for tasks assessed ?Oral Motor ?Oral Motor/Sensory Function ?Overall Oral Motor/Sensory Function: Within functional limits ?Motor Speech ?Overall Motor Speech: Appears within functional limits for tasks assessed ? ?Care Tool ?Care Tool Cognition ?Ability to hear (with hearing aid or hearing appliances if normally used Ability to hear (with hearing aid or hearing appliances if normally used): 0. Adequate - no difficulty in normal conservation, social interaction, listening to TV ?  ?Expression of Ideas and Wants Expression of Ideas and Wants: 3. Some difficulty - exhibits some difficulty with expressing needs and ideas (e.g, some words or finishing thoughts) or speech is not clear ?  ?Understanding Verbal and Non-Verbal Content Understanding Verbal and Non-Verbal Content: 3. Usually understands - understands most conversations, but misses some part/intent of message. Requires cues at times to understand  ?Memory/Recall Ability Memory/Recall Ability : Current season;That he or she is in a hospital/hospital unit  ? ?Short Term Goals: ?Week 1: SLP Short Term Goal 1 (Week 1): Patient will recall and restate specific interventions (medical and therapeutic) with use  of compensatory memory strategies with min A verbal and visual cues ?SLP Short Term Goal 2 (Week 1):  Pt will complete simple and functional working memory tasks with min A cues ?SLP Short Term Goal 3 (Week 1): Pt will

## 2022-02-07 NOTE — Plan of Care (Signed)
?  Problem: RH Balance ?Goal: LTG Patient will maintain dynamic sitting balance (PT) ?Description: LTG:  Patient will maintain dynamic sitting balance with assistance during mobility activities (PT) ?Flowsheets (Taken 02/07/2022 1852) ?LTG: Pt will maintain dynamic sitting balance during mobility activities with:: Supervision/Verbal cueing ?Goal: LTG Patient will maintain dynamic standing balance (PT) ?Description: LTG:  Patient will maintain dynamic standing balance with assistance during mobility activities (PT) ?Flowsheets (Taken 02/07/2022 1852) ?LTG: Pt will maintain dynamic standing balance during mobility activities with:: Minimal Assistance - Patient > 75% ?  ?Problem: Sit to Stand ?Goal: LTG:  Patient will perform sit to stand with assistance level (PT) ?Description: LTG:  Patient will perform sit to stand with assistance level (PT) ?Flowsheets (Taken 02/07/2022 1852) ?LTG: PT will perform sit to stand in preparation for functional mobility with assistance level: Contact Guard/Touching assist ?  ?Problem: RH Bed Mobility ?Goal: LTG Patient will perform bed mobility with assist (PT) ?Description: LTG: Patient will perform bed mobility with assistance, with/without cues (PT). ?Flowsheets (Taken 02/07/2022 1852) ?LTG: Pt will perform bed mobility with assistance level of: Supervision/Verbal cueing ?  ?Problem: RH Bed to Chair Transfers ?Goal: LTG Patient will perform bed/chair transfers w/assist (PT) ?Description: LTG: Patient will perform bed to chair transfers with assistance (PT). ?Flowsheets (Taken 02/07/2022 1852) ?LTG: Pt will perform Bed to Chair Transfers with assistance level: Contact Guard/Touching assist ?  ?Problem: RH Car Transfers ?Goal: LTG Patient will perform car transfers with assist (PT) ?Description: LTG: Patient will perform car transfers with assistance (PT). ?Flowsheets (Taken 02/07/2022 1852) ?LTG: Pt will perform car transfers with assist:: Minimal Assistance - Patient > 75% ?  ?Problem: RH  Ambulation ?Goal: LTG Patient will ambulate in controlled environment (PT) ?Description: LTG: Patient will ambulate in a controlled environment, # of feet with assistance (PT). ?Flowsheets (Taken 02/07/2022 1852) ?LTG: Pt will ambulate in controlled environ  assist needed:: Minimal Assistance - Patient > 75% ?LTG: Ambulation distance in controlled environment: 2ft using LRAD ?Goal: LTG Patient will ambulate in home environment (PT) ?Description: LTG: Patient will ambulate in home environment, # of feet with assistance (PT). ?Flowsheets (Taken 02/07/2022 1852) ?LTG: Pt will ambulate in home environ  assist needed:: Minimal Assistance - Patient > 75% ?LTG: Ambulation distance in home environment: 106ft using LRAD ?  ?Problem: RH Wheelchair Mobility ?Goal: LTG Patient will propel w/c in controlled environment (PT) ?Description: LTG: Patient will propel wheelchair in controlled environment, # of feet with assist (PT) ?Flowsheets (Taken 02/07/2022 1852) ?LTG: Pt will propel w/c in controlled environ  assist needed:: Supervision/Verbal cueing ?LTG: Propel w/c distance in controlled environment: 154ft ?Goal: LTG Patient will propel w/c in home environment (PT) ?Description: LTG: Patient will propel wheelchair in home environment, # of feet with assistance (PT). ?Flowsheets (Taken 02/07/2022 1852) ?LTG: Pt will propel w/c in home environ  assist needed:: Supervision/Verbal cueing ?LTG: Propel w/c distance in home environment: 32ft ?  ?Problem: RH Stairs ?Goal: LTG Patient will ambulate up and down stairs w/assist (PT) ?Description: LTG: Patient will ambulate up and down # of stairs with assistance (PT) ?Flowsheets (Taken 02/07/2022 1852) ?LTG: Pt will ambulate up/down stairs assist needed:: Minimal Assistance - Patient > 75% ?LTG: Pt will  ambulate up and down number of stairs: 3 steps using HRs per home set-up ?  ?

## 2022-02-07 NOTE — H&P (Signed)
?  ?Physical Medicine and Rehabilitation Admission H&P ?  ?  ?   ?Chief Complaint  ?Patient presents with  ? Debility due to orthostatic changes, brain mets with BLE weakness and falls  ?  ?  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 He was admitted on 02/03/22 for work up and MRI brain done revealing significant decrease in size of enhancing lesions (in pons, bilateral temporal lobes, left periventricular white matter and left frontal lobe). Decadron increased to 2 mg IV BID. He was placed on CIWA protocol due to hx of daily ETOH (down from 49 drinks to 3 drinks/day?). CT abdomen/pelvis done revealing new 5-10% left PTX, new acute/subacute right 9-10 rib FX, right foraminal impingement at L5/S1, stable appearance of stable left lung nodule and sclerotic metastatic disease involving thoracic spine and proximal femurs.  ?  ?Dr. Earlie Server consulted and recommended maintaining patient on 2 mg bid as he was feeling better. He continues to have RLE weakness with tendency to buckle, needs increased time to process and follow one step commands, has orthostatic changes with standing attempts as well as  right lateral and posterior lean. CIR recommended due to functional decline.  ?  ?  ?Pt reportts stomach hurts, but no nausea- LBM yesterday?>- pt not sure.  ?Peed last night, but says hasn't this Am- hasn't had urge.  ?Ate a lot of breakfast per SLP.  ?  ?  ?  ?Review of Systems  ?Constitutional:  Negative for chills and fever.  ?HENT:  Negative for hearing loss.   ?Eyes:  Negative for blurred vision and double vision.  ?Respiratory:  Negative for cough.   ?Cardiovascular:  Negative for chest pain and palpitations.   ?Genitourinary:  Negative for dysuria.  ?     Hesitancy. Gets up once a night to urinate.  ?Musculoskeletal:  Negative for myalgias.  ?Skin:  Negative for rash.  ?Neurological:  Positive for dizziness and focal weakness (RLE weakness X 3 weeks). Negative for headaches.  ?Psychiatric/Behavioral:  The patient is nervous/anxious.   ?All other systems reviewed and are negative. ?  ?  ?    ?Past Medical History:  ?Diagnosis Date  ? Allergic rhinitis    ? Allergy    ? Anxiety    ? Chronic low back pain    ? DDD (degenerative disc disease), lumbar    ? ED (erectile dysfunction)    ? Finger injury    ?  cut pads off 3rd and 4th finger left hand/due to lawn mower accident  ? GERD (gastroesophageal reflux disease)    ? Heart murmur    ?  as child only-   ? History of kidney stones    ? HTN (hypertension) 08/20/2016  ? Hyperlipidemia    ? Incomplete right bundle branch block    ? Prostate cancer [facility]) dx 10/02/14  ?  stage T1c  ? Sigmoid diverticulosis    ? Small cell lung cancer ([place]) 03/2020  ? Wears glasses    ?  ?  ?     ?Past Surgical History:  ?Procedure Laterality Date  ? BRONCHIAL BIOPSY   04/12/2020  ?  Procedure: BRONCHIAL BIOPSIES;  Surgeon: Vaughan Browner,  Hart Robinsons, MD;  Location: [place];  Service: Cardiopulmonary;;  ? BRONCHIAL BRUSHINGS   04/12/2020  ?  Procedure: BRONCHIAL BRUSHINGS;  Surgeon: Marshell Garfinkel, MD;  Location: [place] ENDOSCOPY;  Service: Cardiopulmonary;;  ? BRONCHIAL NEEDLE ASPIRATION BIOPSY   04/12/2020  ?  Procedure: BRONCHIAL NEEDLE ASPIRATION BIOPSIES;  Surgeon: Marshell Garfinkel, MD;  Location: [place];  Service: Cardiopulmonary;;  ? BRONCHIAL WASHINGS   04/12/2020  ?  Procedure: BRONCHIAL WASHINGS;  Surgeon: Marshell Garfinkel, MD;  Location: [place] ENDOSCOPY;  Service: Cardiopulmonary;;  ? COLONOSCOPY      ? COLONOSCOPY W/ POLYPECTOMY   04-19-2012  ? ENDOBRONCHIAL ULTRASOUND N/A 04/12/2020  ?  Procedure: ENDOBRONCHIAL ULTRASOUND;  Surgeon: Marshell Garfinkel, MD;  Location: [place] ENDOSCOPY;  Service:  Cardiopulmonary;  Laterality: N/A;  ? ESOPHAGOGASTRODUODENOSCOPY   08-05-2010  ? HEMOSTASIS CONTROL   04/12/2020  ?  Procedure: HEMOSTASIS CONTROL;  Surgeon: Marshell Garfinkel, MD;  Location: [place] ENDOSCOPY;  Service: Cardiopulmonary;;  ? IR IMAGING GUIDED PORT INSERTION   05/31/2020  ? POLYPECTOMY      ? PROSTATE BIOPSY   10/02/14  ? RADIOACTIVE SEED IMPLANT N/A 01/30/2015  ?  Procedure: RADIOACTIVE SEED IMPLANT;  Surgeon: Rana Snare, MD;  Location: [facility];  Service: Urology;  Laterality: N/A;  ? UPPER GASTROINTESTINAL ENDOSCOPY   04/03/2021  ? VIDEO BRONCHOSCOPY N/A 04/12/2020  ?  Procedure: VIDEO BRONCHOSCOPY WITHOUT FLUORO;  Surgeon: Marshell Garfinkel, MD;  Location: [place];  Service: Cardiopulmonary;  Laterality: N/A;  ?  ?     ?Family History  ?Problem Relation Age of Onset  ? Prostate cancer Father    ? Heart disease Father    ? Heart disease Mother    ? Breast cancer Sister    ? Heart disease Other    ? Colon cancer Neg Hx    ? Colon polyps Neg Hx    ? Esophageal cancer Neg Hx    ? Rectal cancer Neg Hx    ? Stomach cancer Neg Hx    ?  ?  ?Social History:  reports that he has been smoking cigarettes. He has a 3.75 pack-year smoking history. He has never used smokeless tobacco. He reports current alcohol use of about 49.0 standard drinks per week. He reports that he does not use drugs. ?  ?  ?     ?Allergies  ?Allergen Reactions  ? Bee Venom Swelling  ? Elemental Sulfur Other (See Comments)  ?    Acute renal failure  ? Cosentyx [Secukinumab] Swelling  ? Other Other (See Comments)  ?    Chemo medicine- patient not sure about name - swelling & rashes  ? Sulfa Antibiotics Other (See Comments)  ?    Pt does not remember reaction ?   ? Namenda [Memantine] Nausea Only  ?  ?  ?      ?Medications Prior to Admission  ?Medication Sig Dispense Refill  ? aspirin EC 81 MG tablet Take 81 mg by mouth daily. Swallow whole.      ? Budeson-Glycopyrrol-Formoterol (BREZTRI AEROSPHERE) 160-9-4.8 MCG/ACT AERO Inhale  2 puffs into the lungs in the morning and at bedtime. 10.7 g 11  ? busPIRone (BUSPAR) 10 MG tablet TAKE 1 TABLET BY MOUTH TWICE A DAY (Patient taking differently: Take 10 mg by mouth 2 (two) times daily.) 180 tablet 2  ? Camphor-Menthol-Methyl Sal (SALONPAS EX) Apply 1 patch topically daily as needed (pain).      ? dexamethasone (DECADRON) 1 MG tablet Take 2 tablets (2  mg total) by mouth daily with breakfast for 7 days, THEN 1 tablet (1 mg total) daily with breakfast for 7 days. 21 tablet 0  ? divalproex (DEPAKOTE) 250 MG DR tablet Take 1 tablet (250 mg total) by mouth 2 (two) times daily. 60 tablet 2  ? ibuprofen (ADVIL) 200 MG tablet Take 400 mg by mouth daily as needed for headache or mild pain.      ? lovastatin (MEVACOR) 20 MG tablet Take 1 tablet (20 mg total) by mouth at bedtime. 90 tablet 3  ? Multiple Vitamin (MULTIVITAMIN) capsule Take 1 capsule by mouth daily.      ? ondansetron (ZOFRAN-ODT) 8 MG disintegrating tablet TAKE 1 TABLET BY MOUTH EVERY 8 HOURS AS NEEDED FOR NAUSEA AND VOMITING (Patient taking differently: Take 8 mg by mouth every 8 (eight) hours as needed for vomiting or nausea.) 18 tablet 4  ? pantoprazole (PROTONIX) 40 MG tablet Take 40 mg by mouth every morning.      ? prochlorperazine (COMPAZINE) 10 MG tablet TAKE 1 TABLET BY MOUTH EVERY 6 HOURS AS NEEDED (Patient taking differently: Take 10 mg by mouth every 6 (six) hours as needed for nausea or vomiting.) 30 tablet 2  ? sodium bicarbonate 650 MG tablet TAKE 1 TABLET BY MOUTH TWICE A DAY (Patient taking differently: Take 650 mg by mouth 2 (two) times daily.) 180 tablet 1  ? traMADol (ULTRAM) 50 MG tablet TAKE 1/2 TABLET BY MOUTH TWICE A DAY AS NEEDED FOR PAIN (Patient taking differently: Take 25 mg by mouth daily as needed for moderate pain.) 60 tablet 2  ? famotidine (PEPCID) 20 MG tablet Take 1 tablet (20 mg total) by mouth 2 (two) times daily. (Patient not taking: Reported on 02/04/2022) 60 tablet 2  ?  ?  ?  ?  ?Home: ?Home  Living ?Family/patient expects to be discharged to:: Private residence ?Living Arrangements: Spouse/significant other ?Available Help at Discharge: Family, Available 24 hours/day ?Type of Home: House ?Home Access: Stairs to enter

## 2022-02-07 NOTE — Plan of Care (Signed)
?  Problem: RH Problem Solving ?Goal: LTG Patient will demonstrate problem solving for (SLP) ?Description: LTG:  Patient will demonstrate problem solving for basic/complex daily situations with cues  (SLP) ?Flowsheets (Taken 02/07/2022 6270) ?LTG: Patient will demonstrate problem solving for (SLP): Basic daily situations ?LTG Patient will demonstrate problem solving for: Supervision ?  ?Problem: RH Memory ?Goal: LTG Patient will demonstrate ability for day to day (SLP) ?Description: LTG:   Patient will demonstrate ability for day to day recall/carryover during cognitive/linguistic activities with assist  (SLP) ?Flowsheets (Taken 02/07/2022 3500) ?LTG: Patient will demonstrate ability for day to day recall: New information ?LTG: Patient will demonstrate ability for day to day recall/carryover during cognitive/linguistic activities with assist (SLP): Supervision ?Goal: LTG Patient will use memory compensatory aids to (SLP) ?Description: LTG:  Patient will use memory compensatory aids to recall biographical/new, daily complex information with cues (SLP) ?Flowsheets (Taken 02/07/2022 9381) ?LTG: Patient will use memory compensatory aids to (SLP): Supervision ?  ?Problem: RH Attention ?Goal: LTG Patient will demonstrate this level of attention during functional activites (SLP) ?Description: LTG:  Patient will will demonstrate this level of attention during functional activites (SLP) ?Flowsheets (Taken 02/07/2022 8299) ?Patient will demonstrate during cognitive/linguistic activities the attention type of: Sustained ?Patient will demonstrate this level of attention during cognitive/linguistic activities in: Home ?LTG: Patient will demonstrate this level of attention during cognitive/linguistic activities with assistance of (SLP): Supervision ?  ?

## 2022-02-08 DIAGNOSIS — R5381 Other malaise: Secondary | ICD-10-CM | POA: Diagnosis not present

## 2022-02-08 LAB — BRAIN NATRIURETIC PEPTIDE: B Natriuretic Peptide: 57.9 pg/mL (ref 0.0–100.0)

## 2022-02-08 MED ORDER — BETHANECHOL CHLORIDE 10 MG PO TABS
10.0000 mg | ORAL_TABLET | Freq: Three times a day (TID) | ORAL | Status: DC
  Administered 2022-02-08 – 2022-02-09 (×4): 10 mg via ORAL
  Filled 2022-02-08 (×4): qty 1

## 2022-02-08 MED ORDER — SODIUM CHLORIDE 0.9 % IV SOLN: INTRAVENOUS | Status: AC

## 2022-02-08 NOTE — Progress Notes (Signed)
?                                                       PROGRESS NOTE ? ? ?Subjective/Complaints: ? ?Pt reports slept fait- LBM overnight.  ?Said requiring in/out caths- nursing said not drinking much; on IVFs fur lack of voiding/urine output yesterday ? ? ?Cannot start Flomax- allergic to Sulfa- will start Urecholine  ? ? ?ROS: ?Limited due to cognition- SLUMS 11/30 ? ?Objective: ?  ?DG Knee AP/LAT W/Sunrise Right ? ?Result Date: 02/06/2022 ?CLINICAL DATA:  Pain.  Abrasion patella after fall at home. EXAM: RIGHT KNEE 3 VIEWS COMPARISON:  None. FINDINGS: No evidence of fracture, dislocation, or joint effusion. No evidence of arthropathy or other focal bone abnormality. Soft tissues are unremarkable. Vascular calcifications are noted. IMPRESSION: No acute abnormality. Electronically Signed   By: Yvonne Kendall M.D.   On: 02/06/2022 13:41   ?Recent Labs  ?  02/06/22 ?0457 02/07/22 ?0432  ?WBC 3.9* 4.1  ?HGB 10.2* 10.4*  ?HCT 29.1* 30.1*  ?PLT 152 146*  ? ?Recent Labs  ?  02/06/22 ?0457 02/07/22 ?0432  ?NA 132* 134*  ?K 3.8 3.7  ?CL 98 100  ?CO2 26 27  ?GLUCOSE 102* 133*  ?BUN 24* 22*  ?CREATININE 0.74 0.81  ?CALCIUM 8.6* 8.8*  ? ? ?Intake/Output Summary (Last 24 hours) at 02/08/2022 1238 ?Last data filed at 02/08/2022 1212 ?Gross per 24 hour  ?Intake 1837.46 ml  ?Output 1870 ml  ?Net -32.54 ml  ?  ? ?  ? ?Physical Exam: ?Vital Signs ?Blood pressure 108/67, pulse 90, temperature 97.9 ?F (36.6 ?C), resp. rate 18, height 6' (1.829 m), weight 64.7 kg, SpO2 100 %. ? ? ?General: awake, alert, appropriate, sitting up in bed; on IVFs 75cc/hour; slightly frail appearing; NAD ?HENT: conjugate gaze; oropharynx moist ?CV: regular rate; no JVD ?Pulmonary: CTA B/L; no W/R/R- good air movement ?GI: soft, NT, ND, (+)BS ?Psychiatric: appropriate but flat ?Neurological: alert x2- not sure why here; knows in hospital; not date/time ?Skin- tenting (+) ?Musculoskeletal:  ?   Cervical back: Neck supple. No tenderness.  ?   Comments: Moving  all extremities B/L- but obviously weak  ?Skin: ?   General: Skin is warm and dry.  ?   Comments: Scab on right knee and healing abrasions left knee.  ?Port that's accessed in R chest;  ?  ?Neurological:  ?   Mental Status: He is alert.  ?   Comments: Slow measured speech. Mild right facial weakness. Occasional intentional tremor RUE. RLE weakness.  ?  ?Poor memory- got 11/30 on SLUMs ?  ?Psychiatric:  ?   Comments: Flat-- poor memory ? ?Assessment/Plan: ?1. Functional deficits which require 3+ hours per day of interdisciplinary therapy in a comprehensive inpatient rehab setting. ?Physiatrist is providing close team supervision and 24 hour management of active medical problems listed below. ?Physiatrist and rehab team continue to assess barriers to discharge/monitor patient progress toward functional and medical goals ? ?Care Tool: ? ?Bathing ?   ?Body parts bathed by patient: Right arm, Left arm, Chest, Front perineal area, Abdomen, Buttocks, Right upper leg, Left upper leg, Face  ? Body parts bathed by helper: Left lower leg, Right lower leg ?  ?  ?Bathing assist Assist Level: Moderate Assistance - Patient 50 - 74% ?  ?  ?  Upper Body Dressing/Undressing ?Upper body dressing   ?What is the patient wearing?: [place] only ?   ?Upper body assist Assist Level: Minimal Assistance - Patient > 75% ?   ?Lower Body Dressing/Undressing ?Lower body dressing ? ? ?   ?What is the patient wearing?: Pants ? ?  ? ?Lower body assist Assist for lower body dressing: Maximal Assistance - Patient 25 - 49% ?   ? ?Toileting ?Toileting Toileting Activity did not occur (Probation officer and hygiene only): N/A (no void or bm)  ?Toileting assist Assist for toileting: Total Assistance - Patient < 25% ?  ?  ?Transfers ?Chair/bed transfer ? ?Transfers assist ? Chair/bed transfer activity did not occur: Safety/medical concerns ? ?Chair/bed transfer assist level: 2 Helpers ?  ?  ?Locomotion ?Ambulation ? ? ?Ambulation assist ? ?  Ambulation activity did not occur: Safety/medical concerns (requires use of AD for safe attempt at ambulation with skilled assistance) ? ?  ?  ?   ? ?Walk 10 feet activity ? ? ?Assist ? Walk 10 feet activity did not occur: Safety/medical concerns ? ?  ?   ? ?Walk 50 feet activity ? ? ?Assist Walk 50 feet with 2 turns activity did not occur: Safety/medical concerns ? ?  ?   ? ? ?Walk 150 feet activity ? ? ?Assist Walk 150 feet activity did not occur: Safety/medical concerns ? ?  ?  ?  ? ?Walk 10 feet on uneven surface  ?activity ? ? ?Assist Walk 10 feet on uneven surfaces activity did not occur: Safety/medical concerns ? ? ?  ?   ? ?Wheelchair ? ? ? ? ?Assist Is the patient using a wheelchair?: Yes ?Type of Wheelchair: Manual ?  ? ?Wheelchair assist level: Dependent - Patient 0% ?   ? ? ?Wheelchair 50 feet with 2 turns activity ? ? ? ?Assist ? ?  ?  ? ? ?Assist Level: Dependent - Patient 0%  ? ?Wheelchair 150 feet activity  ? ? ? ?Assist ?   ? ? ?Assist Level: Dependent - Patient 0%  ? ?Blood pressure 108/67, pulse 90, temperature 97.9 ?F (36.6 ?C), resp. rate 18, height 6' (1.829 m), weight 64.7 kg, SpO2 100 %. ? ?Medical Problem List and Plan: ?1. Functional deficits secondary to debility from metastatic Cancer ?            -patient may not shower- due to R chest port ?            -ELOS/Goals: 2-3 weeks- min A to supervision ? First day of evaluations yesterday- con't PT, OT and SLP ?2.  Antithrombotics: ?-DVT/anticoagulation:  Pharmaceutical: Lovenox ?            -antiplatelet therapy: Low dose ASA.  ?3. Pain Management: Continue Tylenol prn.  ?4. Mood: LCSW to follow for evaluation and support.  ?            -antipsychotic agents: N/A ?5. Neuropsych: This patient may not be fully  capable of making decisions on his own behalf.SLUMs 11/30 ?6. Skin/Wound Care: routine pressure relief measures.  ?7. Fluids/Electrolytes/Nutrition: Monitor I/O. Check lytes in am. ?8.  Recurrent brain mets: On Depakote for seizure  prophylaxis ?            --continue decadron 2 mg BID ?9. Orthostatic hypotension: Continue to check orthostatic vitals.  ?            --TEDs and abdominal binder when out of bed. ?            --  encourage fluid intake. ? 3/19- push fluids- but not drinking much.  ?10. Lung Cancer/COPD: Continue Dulera with incurse.  ?11. Suboptimal  Vitamin B12: Level-218-->received IV doses X 3-->po route daily.  ?12. Hypomagnesemia: Recheck Mg level in am. Improved from 1.5-->1.7 post supplement.  ?13. Urinary retention?: Was cathed prior to d/c from [facility] today. ?--Will monitor voiding with PVR checks. Cath for volumes > 350 cc.  ? 3/19- will con't IVFs 75cc/hour and recheck labs in AM- is also retaining- will start Urecholine 10 mg TID since allergic to Sulfa and cannot do Flomax. Con't I/o caths as required ?14. Pancytopenia: Recheck CBC in am.  ?  3/19- slightly better ?  ?  ? ?LOS: ?2 days ?A FACE TO FACE EVALUATION WAS PERFORMED ? ?Richard Santos ?02/08/2022, 12:38 PM  ? ? ? ? ?

## 2022-02-09 ENCOUNTER — Encounter (HOSPITAL_COMMUNITY): Payer: Self-pay | Admitting: Physical Medicine & Rehabilitation

## 2022-02-09 ENCOUNTER — Other Ambulatory Visit: Payer: Self-pay | Admitting: *Deleted

## 2022-02-09 ENCOUNTER — Ambulatory Visit
Admission: RE | Admit: 2022-02-09 | Discharge: 2022-02-09 | Disposition: A | Discharge status: Home / Self Care | Payer: 59 | Source: Ambulatory Visit | Attending: Radiation Oncology | Admitting: Radiation Oncology

## 2022-02-09 DIAGNOSIS — C61 Malignant neoplasm of prostate: Secondary | ICD-10-CM

## 2022-02-09 DIAGNOSIS — R339 Retention of urine, unspecified: Secondary | ICD-10-CM

## 2022-02-09 DIAGNOSIS — C3412 Malignant neoplasm of upper lobe, left bronchus or lung: Secondary | ICD-10-CM

## 2022-02-09 DIAGNOSIS — R5381 Other malaise: Secondary | ICD-10-CM | POA: Diagnosis not present

## 2022-02-09 DIAGNOSIS — I951 Orthostatic hypotension: Secondary | ICD-10-CM

## 2022-02-09 DIAGNOSIS — C7931 Secondary malignant neoplasm of brain: Secondary | ICD-10-CM

## 2022-02-09 LAB — URINALYSIS, ROUTINE W REFLEX MICROSCOPIC
Bacteria, UA: NONE SEEN
Bilirubin Urine: NEGATIVE
Glucose, UA: NEGATIVE mg/dL
Ketones, ur: NEGATIVE mg/dL
Leukocytes,Ua: NEGATIVE
Nitrite: NEGATIVE
Protein, ur: NEGATIVE mg/dL
Specific Gravity, Urine: 1.008 (ref 1.005–1.030)
pH: 7 (ref 5.0–8.0)

## 2022-02-09 LAB — CBC
HCT: 28.8 % — ABNORMAL LOW (ref 39.0–52.0)
Hemoglobin: 9.7 g/dL — ABNORMAL LOW (ref 13.0–17.0)
MCH: 35.9 pg — ABNORMAL HIGH (ref 26.0–34.0)
MCHC: 33.7 g/dL (ref 30.0–36.0)
MCV: 106.7 fL — ABNORMAL HIGH (ref 80.0–100.0)
Platelets: 143 10*3/uL — ABNORMAL LOW (ref 150–400)
RBC: 2.7 MIL/uL — ABNORMAL LOW (ref 4.22–5.81)
RDW: 13.2 % (ref 11.5–15.5)
WBC: 4.6 10*3/uL (ref 4.0–10.5)
nRBC: 0 % (ref 0.0–0.2)

## 2022-02-09 LAB — BASIC METABOLIC PANEL
Anion gap: 6 (ref 5–15)
BUN: 11 mg/dL (ref 6–20)
CO2: 25 mmol/L (ref 22–32)
Calcium: 8.5 mg/dL — ABNORMAL LOW (ref 8.9–10.3)
Chloride: 101 mmol/L (ref 98–111)
Creatinine, Ser: 0.62 mg/dL (ref 0.61–1.24)
GFR, Estimated: >60 mL/min (ref >60)
Glucose, Bld: 92 mg/dL (ref 70–99)
Potassium: 3.8 mmol/L (ref 3.5–5.1)
Sodium: 132 mmol/L — ABNORMAL LOW (ref 135–145)

## 2022-02-09 LAB — VITAMIN B6: Vitamin B6: 13.1 ug/L (ref 3.4–65.2)

## 2022-02-09 MED ORDER — FLAVOXATE HCL 100 MG PO TABS
100.0000 mg | ORAL_TABLET | Freq: Three times a day (TID) | ORAL | Status: DC | PRN
  Administered 2022-02-13 – 2022-02-19 (×3): 100 mg via ORAL
  Filled 2022-02-09 (×4): qty 1

## 2022-02-09 MED ORDER — LIDOCAINE HCL URETHRAL/MUCOSAL 2 % EX GEL: CUTANEOUS | Status: DC | PRN

## 2022-02-09 MED ORDER — BETHANECHOL CHLORIDE 25 MG PO TABS
25.0000 mg | ORAL_TABLET | Freq: Three times a day (TID) | ORAL | Status: DC
Start: 2022-02-09 — End: 2022-02-09
  Administered 2022-02-09: 25 mg via ORAL
  Filled 2022-02-09: qty 1

## 2022-02-09 MED ORDER — FLUCONAZOLE 100 MG PO TABS
200.0000 mg | ORAL_TABLET | Freq: Once | ORAL | Status: AC
  Administered 2022-02-09: 200 mg via ORAL
  Filled 2022-02-09: qty 2

## 2022-02-09 NOTE — Progress Notes (Signed)
?  Radiation Oncology         [phone] ?________________________________ ? ?Name: Richard Santos MRN: [id number]  ?Date of Service: 02/09/2022  DOB: September 17, 1961 ? ?Post Treatment Telephone Note ? ?Diagnosis:   Extensive stage small cell lung cancer arising in the left lung ? ?Intent: Palliative ? ?Radiation Treatment Dates: 12/25/2021 through 01/07/2022 ?Site Technique Total Dose (Gy) Dose per Fx (Gy) Completed Fx Beam Energies  ?Brain:  ?Whole Brain IMRT 30/30 3 10/10 6X  ? ?Narrative: The patient tolerated radiation therapy relatively well. He was fatigued and had anticipated skin changes in the treatment field. He will follow up with Dr. Mickeal Skinner for ongoing gait disturbance.He's currently hospitalized in the rehabilitation department due to lack of physical strength. He did have an MRI brain on 02/04/22 that showed interval decrease in his brain disease without new disease.  ? ? ?Impression/Plan: ?1.  Extensive stage small cell lung cancer arising in the left lung.  I was unable to reach the patient but left a voicemail and on the message, I discussed discussed that we would be happy to continue to follow him as needed, but he will also continue to follow up with Dr. Julien Nordmann and Dr. Mickeal Skinner as well.  ? ? ? ?Carola Rhine, PAC  ? ? ?  ?

## 2022-02-09 NOTE — Progress Notes (Signed)
Occupational Therapy Session Note ? ?Patient Details  ?Name: CHIRON CAMPIONE ?MRN: [id number] ?Date of Birth: Dec 11, 1960 ? ?Today's Date: 02/09/2022 ?OT Individual Time: 0814-4818 ?OT Individual Time Calculation (min): 42 min  ? ? ?Short Term Goals: ?Week 1:  OT Short Term Goal 1 (Week 1): Pt will complete toilet transfer with min A ?OT Short Term Goal 2 (Week 1): Pt will complete toileting task with mod A ?OT Short Term Goal 3 (Week 1): Pt will engage in dynamic standing balance task without locking knees for 1 min ?OT Short Term Goal 4 (Week 1): Pt will complete LB dressing with mod A ? ?Skilled Therapeutic Interventions/Progress Updates:  ?Patient met lying supine in bed in agreement with OT treatment session. Denied pain this a.m. Supine to EOB with assist to advance BLE from bed surface to EOB and assist at trunk. Squat-pivot to wc on R with Mod A, external assist for placement of RLE and cues for hand placement/technique. Total A for wc transport to and from dayroom for time management. Patient participated in therapeutic exercise on kinetron for 2 trials x3 minutes each. Some difficulty noted 2/2 decreased motor planning and BLE weakness. At conclusion of session patient required Max A for squat-pivot to EOB 2/2 fatigue. Session concluded with patient lying supine in bed with call bell within reach, bed alarm activated and all needs met.  ? ?Therapy Documentation ?Precautions:  ?Precautions ?Precautions: Fall ?Precaution Comments: truncal and BLE limb incoordination; absent R LE sensation with R knee buckle ?Restrictions ?Weight Bearing Restrictions: No ?General: ?  ?Therapy/Group: Individual Therapy ? ?Shristi Scheib R [place]-Davis ?02/09/2022, 7:05 AM ?

## 2022-02-09 NOTE — Progress Notes (Signed)
Physical Therapy Session Note ? ?Patient Details  ?Name: Richard Santos ?MRN: [id number] ?Date of Birth: May 24, 1961 ? ?Today's Date: 02/09/2022 ?PT Individual Time: 0845-1000 ?PT Individual Time Calculation (min): 75 min  ? ?Short Term Goals: ?Week 1:  PT Short Term Goal 1 (Week 1): Pt will perform supine<>sit with CGA ?PT Short Term Goal 2 (Week 1): Pt will perform sit<>stands using LRAD with min assist ?PT Short Term Goal 3 (Week 1): Pt will perform bed<>chair transfers using LRAD with min assist ?PT Short Term Goal 4 (Week 1): Pt will ambulate at least 68ft using LRAD with mod assist ?PT Short Term Goal 5 (Week 1): Pt will initiate stair training ? ?Skilled Therapeutic Interventions/Progress Updates:  ?   ?Patient in bed upon PT arrival. Patient alert and agreeable to PT session. Patient denied pain during session. ? ?Donned B TED hose with total A at beginning of session. No abdominal binder available, RN made aware.  ?Vitals in sitting: BP 100/75, HR 90-114 ? ?Therapeutic Activity: ?Bed Mobility: Patient performed supine to/from sit with min A for trunk and lower extremity management. Provided verbal cues for initiation and rolling through side-lying. ?Transfers: Patient performed sit to/from stand x3 using the [facility] with mod A for boosting up and min A for lowering down for bed>BSC dependent transfer for toileting with successful BM, see flowsheet, and standing for peri-care/lower body clothing management with total A. Able to standing 10-12 sec each trial before fatigue with self-initiation for sitting. Provided verbal cues for forward weight shift, gluteal activation, foot placement with manual assist due to sensory deficits, and trunk, hip, knee extension in standing. ?Performed upper and lower body bathing on BSC with mod A overall. Tolerated sitting with and without upper extremity support without back support >20 min with min cues to prevent retropulsion.  ?Patient performed a squat pivot transfer w/c>bed  with mod A with PT blocking R knee to prevent buckling. Provided multimodal cues for hand placement, foot placement, and head-hips relationship for proper technique and decreased assist with transfers.  ? ?Therapeutic Exercises: ?-B LAQ 2x10 ?-seated marching x2 min ?-seated heel/toe raises 2x10 ? ?Patient in bed due to requesting need for in/out cath, RN made aware, at end of session with breaks locked, bed alarm set, and all needs within reach.  ? ?Therapy Documentation ?Precautions:  ?Precautions ?Precautions: Fall ?Precaution Comments: truncal and BLE limb incoordination; absent R LE sensation with R knee buckle ?Restrictions ?Weight Bearing Restrictions: No ? ? ? ?Therapy/Group: Individual Therapy ? ?Doreene Burke PT, DPT ? ?02/09/2022, 12:25 PM  ?

## 2022-02-09 NOTE — Progress Notes (Signed)
Speech Language Pathology Daily Session Note ? ?Patient Details  ?Name: Richard Santos ?MRN: [id number] ?Date of Birth: 04-Nov-1961 ? ?Today's Date: 02/09/2022 ?SLP Individual Time: 1300-1400 ?SLP Individual Time Calculation (min): 60 min ? ?Short Term Goals: ?Week 1: SLP Short Term Goal 1 (Week 1): Patient will recall and restate specific interventions (medical and therapeutic) with use of compensatory memory strategies with min A verbal and visual cues ?SLP Short Term Goal 2 (Week 1): Pt will complete simple and functional working memory tasks with min A cues ?SLP Short Term Goal 3 (Week 1): Pt will increase sustained attention during functional and therapeutic tasks to 10-15 minutes with min A cues ?SLP Short Term Goal 4 (Week 1): Pt will complete mildly complex problem solving tasks with min A verbal cues ? ?Skilled Therapeutic Interventions: Skilled ST treatment focused on cognitive goals. SLP facilitated session by reviewing ST plan of care as patient expressed he does not recall participating in SLP evaluation on 3/18. Pt very agreeable to work with SLP today regarding cognitive-communication skills with emphasis on short-term recall and utilization of compensatory memory strategies. Patient reports gradual decline with short-term recall which has been frustrating for him. Per report patient used minimal compensations at prior level but willing to consider new approaches. SLP provided education and demonstration on both internal (i.e., repetition, chunking, association) and external strategies (to-do lists, writing on calendar, using phone reminders). Patient utilized personal phone to enter grocery list into "reminders" app and recalled 5/8 items with min A cues for use of association strategy. Patient sustained attention for duration of session with sup A verbal redirection for topic maintenance. Patient was left in bed with alarm activated and immediate needs within reach at end of session. Continue per  current plan of care.   ?   ?Pain ?Pain Assessment ?Pain Scale: 0-10 ?Pain Score: 0-No pain ? ?Therapy/Group: Individual Therapy ? ?Sharmon Cheramie T Dustan Hyams ?02/09/2022, 2:00 PM ?

## 2022-02-09 NOTE — Progress Notes (Signed)
?                                                       PROGRESS NOTE ? ? ?Subjective/Complaints: ?Pt slept ok but still having lower abdominal pain associated with bladder fullness. Requiring I/O caths.  ? ?ROS: Limited due to cognitive/behavioral  ? ?Objective: ?  ?No results found. ?Recent Labs  ?  02/07/22 ?0432 02/09/22 ?0433  ?WBC 4.1 4.6  ?HGB 10.4* 9.7*  ?HCT 30.1* 28.8*  ?PLT 146* 143*  ? ?Recent Labs  ?  02/07/22 ?0432 02/09/22 ?0433  ?NA 134* 132*  ?K 3.7 3.8  ?CL 100 101  ?CO2 27 25  ?GLUCOSE 133* 92  ?BUN 22* 11  ?CREATININE 0.81 0.62  ?CALCIUM 8.8* 8.5*  ? ? ?Intake/Output Summary (Last 24 hours) at 02/09/2022 1147 ?Last data filed at 02/09/2022 1042 ?Gross per 24 hour  ?Intake 3915.71 ml  ?Output 2450 ml  ?Net 1465.71 ml  ?  ? ?  ? ?Physical Exam: ?Vital Signs ?Blood pressure 125/88, pulse 64, temperature 97.7 ?F (36.5 ?C), temperature source Oral, resp. rate 18, height 6' (1.829 m), weight 64.7 kg, SpO2 100 %. ? ? ?Constitutional: No distress . Vital signs reviewed. ?HEENT: NCAT, EOMI, oral membranes moist ?Neck: supple ?Cardiovascular: RRR without murmur. No JVD    ?Respiratory/Chest: CTA Bilaterally without wheezes or rales. Normal effort    ?GI/Abdomen: BS +, sl tender and distended in hypogastric area ?Ext: no clubbing, cyanosis, or edema ?Psych: flat but cooperative  ?Musculoskeletal:  ?   Cervical back: Neck supple. No tenderness.  ?   Comments: Moving all extremities B/L- but obviously weak  ?Skin: ?   General: Skin is warm and dry.  ?   Comments: Scab on right knee and healing abrasions left knee.  ?Port that's accessed in R chest;--stable appearing ?  ?Neurological:  ?   Mental Status: He is alert.  ?   Comments: Slow measured speech. Mild right facial weakness. Occasional intentional tremor RUE. RLE weakness. --no changes. Delayed processing, STM deficits ? ?Assessment/Plan: ?1. Functional deficits which require 3+ hours per day of interdisciplinary therapy in a comprehensive inpatient rehab  setting. ?Physiatrist is providing close team supervision and 24 hour management of active medical problems listed below. ?Physiatrist and rehab team continue to assess barriers to discharge/monitor patient progress toward functional and medical goals ? ?Care Tool: ? ?Bathing ?   ?Body parts bathed by patient: Right arm, Left arm, Chest, Front perineal area, Abdomen, Buttocks, Right upper leg, Left upper leg, Face  ? Body parts bathed by helper: Left lower leg, Right lower leg ?  ?  ?Bathing assist Assist Level: Moderate Assistance - Patient 50 - 74% ?  ?  ?Upper Body Dressing/Undressing ?Upper body dressing   ?What is the patient wearing?: [place] only ?   ?Upper body assist Assist Level: Minimal Assistance - Patient > 75% ?   ?Lower Body Dressing/Undressing ?Lower body dressing ? ? ?   ?What is the patient wearing?: Pants ? ?  ? ?Lower body assist Assist for lower body dressing: Maximal Assistance - Patient 25 - 49% ?   ? ?Toileting ?Toileting Toileting Activity did not occur (Probation officer and hygiene only): N/A (no void or bm)  ?Toileting assist Assist for toileting: Total Assistance - Patient < 25% ?  ?  ?  Transfers ?Chair/bed transfer ? ?Transfers assist ? Chair/bed transfer activity did not occur: Safety/medical concerns ? ?Chair/bed transfer assist level: 2 Helpers ?  ?  ?Locomotion ?Ambulation ? ? ?Ambulation assist ? ? Ambulation activity did not occur: Safety/medical concerns (requires use of AD for safe attempt at ambulation with skilled assistance) ? ?  ?  ?   ? ?Walk 10 feet activity ? ? ?Assist ? Walk 10 feet activity did not occur: Safety/medical concerns ? ?  ?   ? ?Walk 50 feet activity ? ? ?Assist Walk 50 feet with 2 turns activity did not occur: Safety/medical concerns ? ?  ?   ? ? ?Walk 150 feet activity ? ? ?Assist Walk 150 feet activity did not occur: Safety/medical concerns ? ?  ?  ?  ? ?Walk 10 feet on uneven surface  ?activity ? ? ?Assist Walk 10 feet on uneven surfaces activity  did not occur: Safety/medical concerns ? ? ?  ?   ? ?Wheelchair ? ? ? ? ?Assist Is the patient using a wheelchair?: Yes ?Type of Wheelchair: Manual ?  ? ?Wheelchair assist level: Dependent - Patient 0% ?   ? ? ?Wheelchair 50 feet with 2 turns activity ? ? ? ?Assist ? ?  ?  ? ? ?Assist Level: Dependent - Patient 0%  ? ?Wheelchair 150 feet activity  ? ? ? ?Assist ?   ? ? ?Assist Level: Dependent - Patient 0%  ? ?Blood pressure 125/88, pulse 64, temperature 97.7 ?F (36.5 ?C), temperature source Oral, resp. rate 18, height 6' (1.829 m), weight 64.7 kg, SpO2 100 %. ? ?Medical Problem List and Plan: ?1. Functional deficits secondary to debility from metastatic Cancer ?            -patient may not shower- due to R chest port ?            -ELOS/Goals: 2-3 weeks- min A to supervision ? -Continue CIR therapies including PT, OT, and SLP  ?2.  Antithrombotics: ?-DVT/anticoagulation:  Pharmaceutical: Lovenox ?            -antiplatelet therapy: Low dose ASA.  ?3. Pain Management: Continue Tylenol prn.  ?4. Mood: LCSW to follow for evaluation and support.  ?            -antipsychotic agents: N/A ?5. Neuropsych: This patient may not be fully  capable of making decisions on his own behalf. SLUMs 11/30 ?6. Skin/Wound Care: routine pressure relief measures.  ?7. Fluids/Electrolytes/Nutrition: Monitor I/O. Check lytes in am. ?8.  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            --continue decadron 2 mg BID ?9. Orthostatic hypotension: Continue to check orthostatic vitals.  ?            --TEDs and abdominal binder when out of bed. ?            --encourage fluid intake. ? 3/20- pushing fluids- BUN/Cr better today ?10. Lung Cancer/COPD: Continue Dulera with incurse.  ?11. Suboptimal  Vitamin B12: Level-218-->received IV doses X 3-->po route daily.  ?12. Hypomagnesemia: Recheck Mg level in am. Improved from 1.5-->1.7 post supplement.  ?13. Urinary retention: retaining 500-700 cc ? -requiring I/O caths ?-continue  urecholine trial--increase to 25mg  tid ?-up out of bed to void, discussed with pt today ?14. Pancytopenia: Recheck CBC in am.  ?  3/20- labs reviewed and stable ?  ?  ? ?LOS: ?3 days ?A FACE TO FACE EVALUATION WAS PERFORMED ? ?  Meredith Staggers ?02/09/2022, 11:47 AM  ? ? ? ? ?

## 2022-02-09 NOTE — Care Management (Signed)
Inpatient Rehabilitation Center ?Individual Statement of Services ? ?Patient Name:  Richard Santos  ?Date:  02/09/2022 ? ?Welcome to the [facility].  Our goal is to provide you with an individualized program based on your diagnosis and situation, designed to meet your specific needs.  With this comprehensive rehabilitation program, you will be expected to participate in at least 3 hours of rehabilitation therapies Monday-Friday, with modified therapy programming on the weekends. ? ?Your rehabilitation program will include the following services:  Physical Therapy (PT), Occupational Therapy (OT), Speech Therapy (ST), 24 hour per day rehabilitation nursing, Therapeutic Recreaction (TR), Psychology, Neuropsychology, Care Coordinator, Rehabilitation Medicine, Nutrition Services, Pharmacy Services, and Other ? ?Weekly team conferences will be held on Tuesdays to discuss your progress.  Your Inpatient Rehabilitation Care Coordinator will talk with you frequently to get your input and to update you on team discussions.  Team conferences with you and your family in attendance may also be held. ? ?Expected length of stay: 2-3 weeks   ? ?Overall anticipated outcome: Minimal Assistance ? ?Depending on your progress and recovery, your program may change. Your Inpatient Rehabilitation Care Coordinator will coordinate services and will keep you informed of any changes. Your Inpatient Rehabilitation Care Coordinator's name and contact numbers are listed  below. ? ?The following services may also be recommended but are not provided by the [facility]:  ?Driving Evaluations ?Home Health Rehabiltiation Services ?Outpatient Rehabilitation Services ?Vocational Rehabilitation ?  ?Arrangements will be made to provide these services after discharge if needed.  Arrangements include referral to agencies that provide these services. ? ?Your insurance has been verified to be:  [employer] ? ?Your  primary doctor is:  Curt Bears ? ?Pertinent information will be shared with your doctor and your insurance company. ? ?Inpatient Rehabilitation Care Coordinator:  Cathleen Corti [phone] or (C) [phone] ? ?Information discussed with and copy given to patient by: Rana Snare, 02/09/2022, 9:32 AM    ?

## 2022-02-09 NOTE — Progress Notes (Signed)
Inpatient Rehabilitation Care Coordinator ?Assessment and Plan ?Patient Details  ?Name: Richard Santos ?MRN: [id number] ?Date of Birth: 10/15/1961 ? ?Today's Date: 02/09/2022 ? ?Hospital Problems: Principal Problem: ?  Debility ? ?Past Medical History:  ?Past Medical History:  ?Diagnosis Date  ? Allergic rhinitis   ? Allergy   ? Anxiety   ? Chronic low back pain   ? DDD (degenerative disc disease), lumbar   ? ED (erectile dysfunction)   ? Finger injury   ? cut pads off 3rd and 4th finger left hand/due to lawn mower accident  ? GERD (gastroesophageal reflux disease)   ? Heart murmur   ? as child only-   ? History of kidney stones   ? HTN (hypertension) 08/20/2016  ? Hyperlipidemia   ? Incomplete right bundle branch block   ? Prostate cancer [facility]) dx 10/02/14  ? stage T1c  ? Sigmoid diverticulosis   ? Small cell lung cancer ([place]) 03/2020  ? Wears glasses   ? ?Past Surgical History:  ?Past Surgical History:  ?Procedure Laterality Date  ? BRONCHIAL BIOPSY  04/12/2020  ? Procedure: BRONCHIAL BIOPSIES;  Surgeon: Marshell Garfinkel, MD;  Location: [facility]OSCOPY;  Service: Cardiopulmonary;;  ? BRONCHIAL BRUSHINGS  04/12/2020  ? Procedure: BRONCHIAL BRUSHINGS;  Surgeon: Marshell Garfinkel, MD;  Location: [place] ENDOSCOPY;  Service: Cardiopulmonary;;  ? BRONCHIAL NEEDLE ASPIRATION BIOPSY  04/12/2020  ? Procedure: BRONCHIAL NEEDLE ASPIRATION BIOPSIES;  Surgeon: Marshell Garfinkel, MD;  Location: [facility]  Service: Cardiopulmonary;;  ? BRONCHIAL WASHINGS  04/12/2020  ? Procedure: BRONCHIAL WASHINGS;  Surgeon: Marshell Garfinkel, MD;  Location: [place] ENDOSCOPY;  Service: Cardiopulmonary;;  ? COLONOSCOPY    ? COLONOSCOPY W/ POLYPECTOMY  04-19-2012  ? ENDOBRONCHIAL ULTRASOUND N/A 04/12/2020  ? Procedure: ENDOBRONCHIAL ULTRASOUND;  Surgeon: Marshell Garfinkel, MD;  Location: [place] ENDOSCOPY;  Service: Cardiopulmonary;  Laterality: N/A;  ? ESOPHAGOGASTRODUODENOSCOPY  08-05-2010  ? HEMOSTASIS CONTROL  04/12/2020  ? Procedure: HEMOSTASIS CONTROL;  Surgeon: Marshell Garfinkel, MD;  Location: [place] [facility]OSCOPY;  Service: Cardiopulmonary;;  ? IR IMAGING GUIDED PORT INSERTION  05/31/2020  ? POLYPECTOMY    ? PROSTATE BIOPSY  10/02/14  ? RADIOACTIVE SEED IMPLANT N/A 01/30/2015  ? Procedure: RADIOACTIVE SEED IMPLANT;  Surgeon: Rana Snare, MD;  Location: [facility];  Service: Urology;  Laterality: N/A;  ? UPPER GASTROINTESTINAL ENDOSCOPY  04/03/2021  ? VIDEO BRONCHOSCOPY N/A 04/12/2020  ? Procedure: VIDEO BRONCHOSCOPY WITHOUT FLUORO;  Surgeon: Marshell Garfinkel, MD;  Location: [place];  Service: Cardiopulmonary;  Laterality: N/A;  ? ?Social History:  reports that he has been smoking cigarettes. He has a 3.75 pack-year smoking history. He has never used smokeless tobacco. He reports current alcohol use of about 49.0 standard drinks per week. He reports that he does not use drugs. ? ?Family / Support Systems ?Marital Status: Married ?How Long?: 23 years ?Patient Roles: Spouse ?Spouse/Significant Other: [name] (wife) : [phone] ?Children: Wife has two adult children from first marriage ?Other Supports: None reported ?Anticipated Caregiver: Wife ?Ability/Limitations of Caregiver: Pt wife is able to provide 24/7 care ?Caregiver Availability: 24/7 ?Family Dynamics: Pt lives with his wife [name]. ? 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 SSDI begain in 2020. ?Education: some college ?Health Literacy - How often do you need to have someone help you when you read instructions, pamphlets, or other written material from your doctor or pharmacy?: Never ?  Writes: Yes ?Employment Status: Disabled ?Date Retired/Disabled/Unemployed: 2020 ?Legal History/Current Legal Issues: Denies ?Guardian/Conservator: N/A  ? ?Abuse/Neglect ?Abuse/Neglect Assessment Can Be Completed: Yes ?Physical Abuse: Denies ?Verbal Abuse: Denies ?Sexual Abuse:  Denies ?Exploitation of patient/patient's resources: Denies ?Self-Neglect: Denies ? ?Patient response to: ?Social Isolation - How often do you feel lonely or isolated from those around you?: Never ? ?Emotional Status ?Pt's affect, behavior and adjustment status: Pt in good spirits at time of visit. ?Recent Psychosocial Issues: Denies ?Psychiatric History: Denies ?Substance Abuse History: Pt reports he smokes 3-4 cigarettes per day; and 3-4 beers daily. denies any rec drug use. ? ?Patient / Family Perceptions, Expectations & Goals ?Pt/Family understanding of illness & functional limitations: Pt and wife have a general understanding of pt care needs ?Premorbid pt/family roles/activities: Independent ?Anticipated changes in roles/activities/participation: Assistance with ADLs/IADLs ?Pt/family expectations/goals: Pt goal si to work on "getting my legs moving again, they are numb." ? ?Community Resources ?Community Agencies: None ?Premorbid Home Care/DME Agencies: None ?Transportation available at discharge: Wife ?Is the patient able to respond to transportation needs?: Yes ?In the past 12 months, has lack of transportation kept you from medical appointments or from getting medications?: No ?In the past 12 months, has lack of transportation kept you from meetings, work, or from getting things needed for daily living?: No ?Resource referrals recommended: Neuropsychology ? ?Discharge Planning ?Living Arrangements: Spouse/significant other ?Support Systems: Spouse/significant other ?Type of Residence: Private residence ?Insurance Resources: Multimedia programmer (specify) ([employer]) ?Financial Resources: SSD ?Financial Screen Referred: No ?Living Expenses: Own ?Money Management: Spouse, Patient ?Does the patient have any problems obtaining your medications?: No ?Home Management: Pt reports home care duties are shared between him and his wife. ?Patient/Family Preliminary Plans: TBD ?Care Coordinator Barriers to  Discharge: Decreased caregiver support, Insurance for SNF coverage, Lack of/limited family support ?Care Coordinator Anticipated Follow Up Needs: HH/OP ?Expected length of stay: 2-3 weeks ? ?Clinical Impression ?SW met with pt in room to introduce self, explain role, and discuss discharge process. Pt is not a English as a second language teacher. No HCPOA. DME: cane, quad cane, and rollator. Pt is aware SW will follow-up with his wife. ? ?11- SW spoke with pt wife [name] to introduce self, explain role, discuss discharge process, and inform on ELOS. SW will f/u tomorrow with updates from team conference. No questions/concerns at this time.  ? ?Rana Snare ?02/09/2022, 2:45 PM ? ?  ?

## 2022-02-09 NOTE — Progress Notes (Signed)
This nurse placed a 18fr. coude foley catheter at [place] with [place] , RN. Patient tolerated well. Urine returned , Sampled for UA and culture.  No issues.   ? ?Arn Medal , LPN ?

## 2022-02-09 NOTE — IPOC Note (Signed)
Overall Plan of Care (IPOC) ?Patient Details ?Name: Richard Santos ?MRN: [id number] ?DOB: 10-25-1961 ? ?Admitting Diagnosis: Debility, metastatic cancer to the brain ? ?Hospital Problems: Principal Problem: ?  Debility ? ? ? ? Functional Problem List: ?Nursing Bladder, Bowel, Medication Management, Safety, Nutrition, Endurance, Pain  ?PT Balance, Safety, Edema, Sensory, Endurance, Skin Integrity, Motor, Nutrition, Pain  ?OT Balance, Safety, Sensory, Behavior, Cognition, Skin Integrity, Edema, Endurance, Motor, Nutrition, Pain, Perception  ?SLP Cognition  ?TR    ?    ? Basic ADL?s: ?OT Grooming, Bathing, Dressing, Toileting  ? ?  Advanced  ADL?s: ?OT    ?   ?Transfers: ?PT Bed Mobility, Bed to Chair, Car, Furniture  ?OT Toilet, Tub/Shower  ? ?  Locomotion: ?PT Ambulation, Wheelchair Mobility, Stairs  ? ?  Additional Impairments: ?OT None  ?SLP Social Cognition ?  ?Problem Solving, Memory, Attention  ?TR    ? ? ?Anticipated Outcomes ?Item Anticipated Outcome  ?Self Feeding N/A  ?Swallowing ?   ?  ?Basic self-care ? Supervision-min A  ?Toileting ? Min A ?  ?Bathroom Transfers Supervision  ?Bowel/Bladder ? manage w mod I assist  ?Transfers ? CGA using LRAD  ?Locomotion ? min assist using LRAD  ?Communication ?    ?Cognition ? Supervision A  ?Pain ? pain at or below level 4 w prns  ?Safety/Judgment ? maintain w cues  ? ?Therapy Plan: ?PT Intensity: Minimum of 1-2 x/day ,45 to 90 minutes ?PT Frequency: 5 out of 7 days ?PT Duration Estimated Length of Stay: ~2.5-3 weeks ?OT Intensity: Minimum of 1-2 x/day, 45 to 90 minutes ?OT Frequency: 5 out of 7 days ?OT Duration/Estimated Length of Stay: 2-3 weeks ?SLP Intensity: Minumum of 1-2 x/day, 30 to 90 minutes ?SLP Frequency: 3 to 5 out of 7 days ?SLP Duration/Estimated Length of Stay: 2.5-3 weeks  ? ?Due to the current state of emergency, patients may not be receiving their 3-hours of Medicare-mandated therapy. ? ? Team Interventions: ?Nursing Interventions Bladder Management,  Disease Management/Prevention, Medication Management, Discharge Planning, Pain Management, Bowel Management, Patient/Family Education  ?PT interventions Ambulation/gait training, Community reintegration, DME/adaptive equipment instruction, Neuromuscular re-education, Psychosocial support, Stair training, UE/LE Strength taining/ROM, Wheelchair propulsion/positioning, Training and development officer, Discharge planning, Pain management, Skin care/wound management, Therapeutic Activities, UE/LE Coordination activities, Cognitive remediation/compensation, Disease management/prevention, Patient/family education, Functional mobility training, Splinting/orthotics, Therapeutic Exercise, Visual/perceptual remediation/compensation  ?OT Interventions Balance/vestibular training, Disease mangement/prevention, Neuromuscular re-education, Self Care/advanced ADL retraining, Therapeutic Exercise, Wheelchair propulsion/positioning, DME/adaptive equipment instruction, Cognitive remediation/compensation, Pain management, UE/LE Strength taining/ROM, Skin care/wound managment, Community reintegration, Functional electrical stimulation, Patient/family education, Splinting/orthotics, UE/LE Coordination activities, Discharge planning, Functional mobility training, Psychosocial support, Therapeutic Activities, Visual/perceptual remediation/compensation  ?SLP Interventions Cognitive remediation/compensation, Internal/external aids, Therapeutic Activities, Patient/family education, Functional tasks, Cueing hierarchy  ?TR Interventions    ?SW/CM Interventions Discharge Planning, Psychosocial Support, Patient/Family Education  ? ?Barriers to Discharge ?MD  Medical stability  ?Nursing Decreased caregiver support, Home environment access/layout ?2 level main B+B 3 ste w wife; hs RW, BSC, Quad cane  ?PT Pending chemo/radiation, Lack of/limited family support, Home environment access/layout ?   ?OT   ?   ?SLP   ?   ?SW   ?   ? ?Team Discharge  Planning: ?Destination: PT-Home ,OT- Home , SLP-Home ?Projected Follow-up: PT-Home health PT, 24 hour supervision/assistance, OT-  Other (comment) (TBD), SLP-24 hour supervision/assistance ?Projected Equipment Needs: PT-To be determined, OT- To be determined, SLP-None recommended by SLP ?Equipment Details: PT- , OT-  ?Patient/family involved in discharge planning: PT-  Patient,  OT-Patient, SLP-Patient ? ?MD ELOS: 14-17 days ?Medical Rehab Prognosis:  Excellent ?Assessment: The patient has been admitted for CIR therapies with the diagnosis of metastatic cancer to the brain. The team will be addressing functional mobility, strength, stamina, balance, safety, adaptive techniques and equipment, self-care, bowel and bladder mgt, patient and caregiver education, NMR, cognition, communication. Goals have been set at supervision to min assist with basic mobility and self-care, cognition/communication. Anticipated discharge destination is home with family. ? ?Due to the current state of emergency, patients may not be receiving their 3 hours per day of Medicare-mandated therapy.  ? ? ?Meredith Staggers, MD, FAAPMR   ? ? ?See Team Conference Notes for weekly updates to the plan of care  ?

## 2022-02-09 NOTE — Progress Notes (Signed)
Patient reported to be having significant abdominal pain --was cathed for 400 cc but continued to complain of abdominal pain wanted to be cathed again. On exam, patient lying in bed holding penis due to penile discomfort. Pointed to meatus as cause of sharp/severe pain. Question small ulcer without drainage or tenderness. No abdominal pain on palpation but reported referred penile pain with suprapubic pressure. Patient and wife want foley placed for relief--report that he was voiding without difficulty prior to admission. Discussed need for cath prior to d/c from [facility] as well as improvement in intake with increase in urine production.  Question abdominal pain/spasms due to SE of urecholine v/s UTI v/s yeast infection as cause of pain. WBC stable (improving) and no fevers noted. Will check UA/UCS. Will place foley for bladder rest X few days. Monitor for now.  ?

## 2022-02-09 NOTE — Progress Notes (Signed)
Inpatient Rehabilitation  Patient information reviewed and entered into eRehab system by Hope Brandenburger M. Myisha Pickerel, M.A., CCC/SLP, PPS Coordinator.  Information including medical coding, functional ability and quality indicators will be reviewed and updated through discharge.    

## 2022-02-10 ENCOUNTER — Encounter: Payer: Self-pay | Admitting: Internal Medicine

## 2022-02-10 DIAGNOSIS — I951 Orthostatic hypotension: Secondary | ICD-10-CM | POA: Diagnosis not present

## 2022-02-10 DIAGNOSIS — R339 Retention of urine, unspecified: Secondary | ICD-10-CM | POA: Diagnosis not present

## 2022-02-10 DIAGNOSIS — C7931 Secondary malignant neoplasm of brain: Secondary | ICD-10-CM | POA: Diagnosis not present

## 2022-02-10 DIAGNOSIS — R5381 Other malaise: Secondary | ICD-10-CM | POA: Diagnosis not present

## 2022-02-10 LAB — VITAMIN B1: Vitamin B1 (Thiamine): 110.5 nmol/L (ref 66.5–200.0)

## 2022-02-10 MED ORDER — HEPARIN SOD (PORK) LOCK FLUSH 100 UNIT/ML IV SOLN
500.0000 [IU] | INTRAVENOUS | Status: DC | PRN
  Administered 2022-02-10: 500 [IU]
  Filled 2022-02-10: qty 5

## 2022-02-10 MED ORDER — HEPARIN SOD (PORK) LOCK FLUSH 100 UNIT/ML IV SOLN
500.0000 [IU] | INTRAVENOUS | Status: DC
  Filled 2022-02-10: qty 5

## 2022-02-10 MED ORDER — MIDAZOLAM HCL 2 MG/2ML IJ SOLN
INTRAMUSCULAR | Status: AC
  Filled 2022-02-10: qty 2

## 2022-02-10 MED ORDER — ONDANSETRON HCL 4 MG/2ML IJ SOLN
INTRAMUSCULAR | Status: AC
  Filled 2022-02-10: qty 2

## 2022-02-10 MED ORDER — FENTANYL CITRATE (PF) 100 MCG/2ML IJ SOLN
INTRAMUSCULAR | Status: AC
  Filled 2022-02-10: qty 2

## 2022-02-10 NOTE — Progress Notes (Signed)
Physical Therapy Session Note ? ?Patient Details  ?Name: Richard Santos ?MRN: [id number] ?Date of Birth: June 20, 1961 ? ?Today's Date: 02/10/2022 ?PT Individual Time: 8182-9937 ?PT Individual Time Calculation (min): 25 min  ? ?Short Term Goals: ?Week 1:  PT Short Term Goal 1 (Week 1): Pt will perform supine<>sit with CGA ?PT Short Term Goal 2 (Week 1): Pt will perform sit<>stands using LRAD with min assist ?PT Short Term Goal 3 (Week 1): Pt will perform bed<>chair transfers using LRAD with min assist ?PT Short Term Goal 4 (Week 1): Pt will ambulate at least 19ft using LRAD with mod assist ?PT Short Term Goal 5 (Week 1): Pt will initiate stair training ? ?Skilled Therapeutic Interventions/Progress Updates:  ?  Patient received supine in bed, RN/NT present having transferred patient back to bed. He was agreeable to PT and denied pain. Patient coming to sit edge of bed with MaxA and max verbal cuing for sequencing. Once seated edge of bed, he required fluctuating MinA-MaxA to maintain static sitting balance with posterior bias noted. PT questioning extensor tone in trunk due to very limited ability to flex trunk. Patient with limited awareness of postural deviations. Poor ability to complete dual task maintaining balance while providing dinner order to food services. Patient returning supine with MaxA and repositioning in bed with MaxA. Bed alarm on, call light within reach.  ? ?Therapy Documentation ?Precautions:  ?Precautions ?Precautions: Fall ?Precaution Comments: truncal and BLE limb incoordination; absent R LE sensation with R knee buckle ?Restrictions ?Weight Bearing Restrictions: No ? ? ? ?Therapy/Group: Individual Therapy ? ?Debbora Dus ?Debbora Dus, PT, DPT, CBIS ? ?02/10/2022, 7:50 AM  ?

## 2022-02-10 NOTE — Progress Notes (Signed)
?                                                       PROGRESS NOTE ? ? ?Subjective/Complaints: ?Pt with a better night. Appreciates having the foley in. Asked how long it needed to stay. Denies further abdominal pain or "spasms" ? ?ROS: Limited due to cognitive/behavioral  ? ?Objective: ?  ?No results found. ?Recent Labs  ?  02/09/22 ?0433  ?WBC 4.6  ?HGB 9.7*  ?HCT 28.8*  ?PLT 143*  ? ?Recent Labs  ?  02/09/22 ?0433  ?NA 132*  ?K 3.8  ?CL 101  ?CO2 25  ?GLUCOSE 92  ?BUN 11  ?CREATININE 0.62  ?CALCIUM 8.5*  ? ? ?Intake/Output Summary (Last 24 hours) at 02/10/2022 1156 ?Last data filed at 02/10/2022 0900 ?Gross per 24 hour  ?Intake 240 ml  ?Output 2675 ml  ?Net -2435 ml  ?  ? ?  ? ?Physical Exam: ?Vital Signs ?Blood pressure 109/81, pulse 76, temperature (!) 97.5 ?F (36.4 ?C), temperature source Oral, resp. rate 18, height 6' (1.829 m), weight 64.7 kg, SpO2 100 %. ? ? ?Constitutional: No distress . Vital signs reviewed. ?HEENT: NCAT, EOMI, oral membranes moist ?Neck: supple ?Cardiovascular: RRR without murmur. No JVD    ?Respiratory/Chest: CTA Bilaterally without wheezes or rales. Normal effort    ?GI/Abdomen: BS +, non-tender, non-distended ?Ext: no clubbing, cyanosis, or edema ?Psych: pleasant and cooperative  ?Musculoskeletal:  ?   Cervical back: Neck supple. No tenderness.  ?   Comments: Moving all extremities B/L- but obviously weak  ?Skin: ?   General: Skin is warm and dry.  ?   Comments: Scab on right knee and healing abrasions left knee.  ?Port that's accessed in R chest;--stable appearing ?  ?Neurological:  ?   Mental Status: He is alert.  ?   Comments: Slow measured speech. Delayed processing. Fair awareness and insight. Mild right facial weakness. Occasional intentional tremor RUE. RLE weakness. +- RLE sensory deficits. Delayed processing, STM deficits ? ?Assessment/Plan: ?1. Functional deficits which require 3+ hours per day of interdisciplinary therapy in a comprehensive inpatient rehab  setting. ?Physiatrist is providing close team supervision and 24 hour management of active medical problems listed below. ?Physiatrist and rehab team continue to assess barriers to discharge/monitor patient progress toward functional and medical goals ? ?Care Tool: ? ?Bathing ?   ?Body parts bathed by patient: Right arm, Left arm, Chest, Front perineal area, Abdomen, Buttocks, Right upper leg, Left upper leg, Face  ? Body parts bathed by helper: Left lower leg, Right lower leg ?  ?  ?Bathing assist Assist Level: Moderate Assistance - Patient 50 - 74% ?  ?  ?Upper Body Dressing/Undressing ?Upper body dressing   ?What is the patient wearing?: [place] only ?   ?Upper body assist Assist Level: Minimal Assistance - Patient > 75% ?   ?Lower Body Dressing/Undressing ?Lower body dressing ? ? ?   ?What is the patient wearing?: Pants ? ?  ? ?Lower body assist Assist for lower body dressing: Maximal Assistance - Patient 25 - 49% ?   ? ?Toileting ?Toileting Toileting Activity did not occur (Probation officer and hygiene only): N/A (no void or bm)  ?Toileting assist Assist for toileting: Total Assistance - Patient < 25% ?  ?  ?Transfers ?Chair/bed transfer ? ?  Transfers assist ? Chair/bed transfer activity did not occur: Safety/medical concerns ? ?Chair/bed transfer assist level: 2 Helpers ?  ?  ?Locomotion ?Ambulation ? ? ?Ambulation assist ? ? Ambulation activity did not occur: Safety/medical concerns (requires use of AD for safe attempt at ambulation with skilled assistance) ? ?  ?  ?   ? ?Walk 10 feet activity ? ? ?Assist ? Walk 10 feet activity did not occur: Safety/medical concerns ? ?  ?   ? ?Walk 50 feet activity ? ? ?Assist Walk 50 feet with 2 turns activity did not occur: Safety/medical concerns ? ?  ?   ? ? ?Walk 150 feet activity ? ? ?Assist Walk 150 feet activity did not occur: Safety/medical concerns ? ?  ?  ?  ? ?Walk 10 feet on uneven surface  ?activity ? ? ?Assist Walk 10 feet on uneven surfaces activity  did not occur: Safety/medical concerns ? ? ?  ?   ? ?Wheelchair ? ? ? ? ?Assist Is the patient using a wheelchair?: Yes ?Type of Wheelchair: Manual ?  ? ?Wheelchair assist level: Dependent - Patient 0% ?   ? ? ?Wheelchair 50 feet with 2 turns activity ? ? ? ?Assist ? ?  ?  ? ? ?Assist Level: Dependent - Patient 0%  ? ?Wheelchair 150 feet activity  ? ? ? ?Assist ?   ? ? ?Assist Level: Dependent - Patient 0%  ? ?Blood pressure 109/81, pulse 76, temperature (!) 97.5 ?F (36.4 ?C), temperature source Oral, resp. rate 18, height 6' (1.829 m), weight 64.7 kg, SpO2 100 %. ? ?Medical Problem List and Plan: ?1. Functional deficits secondary to debility from metastatic Cancer ?            -patient may not shower- due to R chest port ?            -ELOS/Goals: 02/20/22- min A to supervision ? -Continue CIR therapies including PT, OT, and SLP. Interdisciplinary team conference today to discuss goals, barriers to discharge, and dc planning.    ?2.  Antithrombotics: ?-DVT/anticoagulation:  Pharmaceutical: Lovenox ?            -antiplatelet therapy: Low dose ASA.  ?3. Pain Management: Continue Tylenol prn.  ?4. Mood: LCSW to follow for evaluation and support.  ?            -antipsychotic agents: N/A ?5. Neuropsych: This patient may not be fully  capable of making decisions on his own behalf. SLUMs 11/30 ?6. Skin/Wound Care: routine pressure relief measures.  ?7. Fluids/Electrolytes/Nutrition: Monitor I/O. Check lytes in am. ?8.  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            --continue decadron 2 mg BID ? -outpt f/u with Dr. Julien Nordmann to decide upon plan ?9. Orthostatic hypotension: Continue to check orthostatic vitals.  ?            --TEDs and abdominal binder when out of bed. ?            --encourage fluid intake. ? 3/20- pushing fluids- BUN/Cr better  ?10. Lung Cancer/COPD: Continue Dulera with incurse.  ?11. Suboptimal  Vitamin B12: Level-218-->received IV doses X 3-->po route daily.  ?12. Hypomagnesemia:  Recheck Mg level in am. Improved from 1.5-->1.7 post supplement.  ?13. Urinary retention: did not tolerate urecholine ? -foley replaced, pt feels better ? -UA neg, UCX in process ?  ?14. Pancytopenia:  .  ?  3/20- labs reviewed and stable ?  ?  ? ?  LOS: ?4 days ?A FACE TO FACE EVALUATION WAS PERFORMED ? ?Meredith Staggers ?02/10/2022, 11:56 AM  ? ? ? ? ?

## 2022-02-10 NOTE — Progress Notes (Signed)
Speech Language Pathology Daily Session Note ? ?Patient Details  ?Name: Richard Santos ?MRN: [id number] ?Date of Birth: Nov 24, 1960 ? ?Today's Date: 02/10/2022 ?SLP Individual Time: 2458-0998 ?SLP Individual Time Calculation (min): 40 min ? ?Short Term Goals: ?Week 1: SLP Short Term Goal 1 (Week 1): Patient will recall and restate specific interventions (medical and therapeutic) with use of compensatory memory strategies with min A verbal and visual cues ?SLP Short Term Goal 2 (Week 1): Pt will complete simple and functional working memory tasks with min A cues ?SLP Short Term Goal 3 (Week 1): Pt will increase sustained attention during functional and therapeutic tasks to 10-15 minutes with min A cues ?SLP Short Term Goal 4 (Week 1): Pt will complete mildly complex problem solving tasks with min A verbal cues ? ?Skilled Therapeutic Interventions: Skilled treatment session focused on cognitive goals. Upon arrival, patient was awake in bed. SLP facilitated session with discussion regarding goals for skilled SLP intervention. Patient reported that he feels he is at his cognitive baseline (prior to admission) and does not endorse any changes in memory. Patient reported his only goal is to be able to "get up and walk" and if that goal is not met, "then this was a waste of time and money." Patient also reported desire to mow his grass this upcoming summer. SLP facilitated a gentle and subtle conversation that focused on the overall goal of rehab is "safety" and that treatment will focus on whatever will keep him and his wife safe at home. Patient verbalized understanding but will need reinforcement. SLP and patient both attempted to call the patient's wife separately to discuss most recent conversation with patient and her overall goals, unable to reach her at this time. Patient left upright in bed with alarm on and all needs within reach. Continue with current plan of care.  ?   ? ?Pain ?No/Denies Pain  ? ?Therapy/Group:  Individual Therapy ? ?Erik Nessel ?02/10/2022, 3:09 PM ?

## 2022-02-10 NOTE — Progress Notes (Signed)
Physical Therapy Session Note ? ?Patient Details  ?Name: Richard Santos ?MRN: [id number] ?Date of Birth: 1961-04-23 ? ?Today's Date: 02/10/2022 ?PT Individual Time: 0900-1000 ?PT Individual Time Calculation (min): 60 min  ? ?Short Term Goals: ?Week 1:  PT Short Term Goal 1 (Week 1): Pt will perform supine<>sit with CGA ?PT Short Term Goal 2 (Week 1): Pt will perform sit<>stands using LRAD with min assist ?PT Short Term Goal 3 (Week 1): Pt will perform bed<>chair transfers using LRAD with min assist ?PT Short Term Goal 4 (Week 1): Pt will ambulate at least 30ft using LRAD with mod assist ?PT Short Term Goal 5 (Week 1): Pt will initiate stair training ? ?Skilled Therapeutic Interventions/Progress Updates:  ?   ?Patient in bed with IV assessment central line upon PT arrival. Patient missed 15 min of skilled PT due to nursing care, RN made aware. Will attempt to make-up missed time as able.  Patient alert and agreeable to PT session. Patient denied pain during session. ? ?Patient reporting decreased L lower extremity sensation and position sense, confirmed with testing, and demonstrating increased lower extremity weakness and functional mobility compared to evaluation with mild L lower extremity extensor tone that was not observed per PT evaluation. MD and nursing made aware.  ? ?Therapeutic Activity: ? Patient performed supine to/from sit with min A for trunk and lower extremity management. Provided verbal cues for initiation and rolling through side-lying. ?Transfers: Patient performed a squat pivot transfer w/c>bed with max A with PT blocking R knee to prevent buckling, however L lower extremity pushed into extensor tone causing poor balance and safety during transfer. Provided multimodal cues for hand placement, foot placement, and head-hips relationship for proper technique and decreased assist with transfers. ?Patient unable to perform sit to/from stand using the [place] with Max A x2, then performed sit to/from stand  using the [facility] with max A +2 for boosting up and max A of 1 for lowering down to the bed. Dependently transfer w/c>bed using Stedy.  ? ?Performed lower extremity assessment following transfers with findings detailed above. Patient distressed by his decline in performance today. PT educated on challenges with patient's diagnosis and fluctuations in recovery. Provided therapeutic listening and coping strategies to support patients outlook.  ? ?Patient in bed at end of session with breaks locked, bed alarm set, and all needs within reach.  ? ?Therapy Documentation ?Precautions:  ?Precautions ?Precautions: Fall ?Precaution Comments: truncal and BLE limb incoordination; absent R LE sensation with R knee buckle ?Restrictions ?Weight Bearing Restrictions: No ? ? ? ?Therapy/Group: Individual Therapy ? ?Doreene Burke PT, DPT ? ?02/10/2022, 5:06 PM  ?

## 2022-02-10 NOTE — Progress Notes (Signed)
Patient ID: Richard Santos, male   DOB: 22-Feb-1961, 61 y.o.   MRN: [id number] ? ?SW met with pt in room to provide updates from team conference, and d/c date 3/31. Pt continues to want answers on the reason his legs are numb. SW encouraged him to speak with physician about this. He is aware SW will f/u with his wife.  ? ?1601-SW called pt wife [name] [phone]) to provide above updates. SW shared above info on d/c, scheduling family education, and recommended DME: hoyer lift and hospital bed. SW waiting on follow-up.  ? ?[name], MSW, LCSWA ?Office: [phone] ?Cell: [phone] ?Fax: [phone]  ?

## 2022-02-10 NOTE — Patient Care Conference (Signed)
Inpatient RehabilitationTeam Conference and Plan of Care Update ?Date: 02/10/2022   Time: 10:13 AM  ? ? ?Patient Name: Richard Santos      ?Medical Record Number: [id number]  ?Date of Birth: 10-03-1961 ?Sex: Male         ?Room/Bed: 4W11C/4W11C-01 ?Payor Info: Payor: [place] / Plan: [place] / Product Type: *No Product type* /   ? ?Admit Date/Time:  02/06/2022  6:01 PM ? ?Primary Diagnosis:  Debility ? ?Hospital Problems: Principal Problem: ?  Debility ? ? ? ?Expected Discharge Date: Expected Discharge Date: 02/20/22 ? ?Team Members Present: ?Physician leading conference: Dr. Alger Simons ?Social Worker Present: [name], LCSWA ?Nurse Present: Dorthula Nettles, RN ?PT Present: Apolinar Junes, PT ?OT Present: Mariane Masters, OT ?SLP Present: Weston Anna, SLP ?PPS Coordinator present : Gunnar Fusi, SLP ? ?   Current Status/Progress Goal Weekly Team Focus  ?Bowel/Bladder ? ? Continent of bowel, Urinary catheter in place; LBM 02/09/22  Remain continent of bowel and bladder  Assess bowel and bladder needs q shift and PRN and offer toileting q2 hours while wake.   ?Swallow/Nutrition/ Hydration ? ?           ?ADL's ? ? MOD A functional transfers and UB, MAX A LB ADLS, barrier is currently BOC and prognosis medically  S-MIN overall  funcitonal transfers, BADL retraining, balance, activity tolernace, decrease BOC   ?Mobility ? ? Min A bed mobility, Mod A transfers, using Stedy +2 with nursing, decreased proprioception bilaterally, new L LE extensor tone  Min A overall  Activity tolerance, strengthening, balance, functional mobility, standing/sitting tolerance, gait training as able, patient/caregiver education   ?Communication ? ?           ?Safety/Cognition/ Behavioral Observations ? min-to-mod A basic tasks. Pt lives sedentary lifestyle. Reports memory as being most significant change - exhibited difficulty at baseline.  sup A  compensatory memory strategies, basic problem solving,  sustained attention.   ?Pain ? ? Patient denies pain.  Pain < or equal to 2/10.  Assess patient for pain q shift and provide prn pain medication as needed.   ?Skin ? ? Abrasion to right knee and left elbow with scab and ecchymosis to abdomen.  Remain free from skin breakdown.  Assess skin q shift and as needed.   ? ? ?Discharge Planning:  ?D/c to home with 24/7 care from wife.   ?Team Discussion: ?Lung cancer with mets to brain. History Prostate cancer. Foley placed due to urinary retention, UA negative. Orthostatic hypotension. Follow-up cancer treatment versus palliative. Palliative appt. 02/17/2022. Palliative can meet with patient on unit. Continent bowel. No pain reported. Abrasion to right knee and left elbow. Discharging home with wife. May need hospital bed and [facility] lift. Will need physical assist at home. ? ?Patient on target to meet rehab goals: ?Min assist goals. Currently mod assist squat pivot. Mod/max assist with ADL's. Experiencing increased BLE weakness and numbness. Cognition slow, decreased memory and problem solving. ? ?*See Care Plan and progress notes for long and short-term goals.  ? ?Revisions to Treatment Plan:  ?Adjusting medications, foley insertion ?  ?Teaching Needs: ?Family education, medication management, foley care, skin/wound care, safety awareness, transfer/gait training, etc. ?  ?Current Barriers to Discharge: ?Decreased caregiver support, Home enviroment access/layout, Incontinence, Wound care, Lack of/limited family support, Pending chemo/radiation, and lack of insurance. ? ?Possible Resolutions to Barriers: ?Family education ?Palliative Care ?Order recommended DME ?  ? ? Medical Summary ?Current Status: metastatic lung cancer to  brain with generalized weakness and cognitive deficits. oncology is discussing treatment options (CTX vs palliiative/hospice). urine retention ? Barriers to Discharge: Medical stability ?  ?Possible Resolutions to [employer]: daily assessment  of labs and patient data, foley replaced for urine retention ? ? ?Continued Need for Acute Rehabilitation Level of Care: The patient requires daily medical management by a physician with specialized training in physical medicine and rehabilitation for the following reasons: ?Direction of a multidisciplinary physical rehabilitation program to maximize functional independence : Yes ?Medical management of patient stability for increased activity during participation in an intensive rehabilitation regime.: Yes ?Analysis of laboratory values and/or radiology reports with any subsequent need for medication adjustment and/or medical intervention. : Yes ? ? ?I attest that I was present, lead the team conference, and concur with the assessment and plan of the team. ? ? ?Dorthula Nettles G ?02/10/2022, 2:01 PM  ? ? ? ? ? ? ?

## 2022-02-10 NOTE — Progress Notes (Signed)
Occupational Therapy Session Note ? ?Patient Details  ?Name: Richard Santos ?MRN: [id number] ?Date of Birth: 1961/03/06 ? ?Today's Date: 02/10/2022 ?OT Individual Time: 1100-1200 ?OT Individual Time Calculation (min): 60 min  ? ? ?Short Term Goals: ?Week 1:  OT Short Term Goal 1 (Week 1): Pt will complete toilet transfer with min A ?OT Short Term Goal 2 (Week 1): Pt will complete toileting task with mod A ?OT Short Term Goal 3 (Week 1): Pt will engage in dynamic standing balance task without locking knees for 1 min ?OT Short Term Goal 4 (Week 1): Pt will complete LB dressing with mod A ? ?Skilled Therapeutic Interventions/Progress Updates:  ?   ?Pt received in bed with no pain reported. M ? ?ADL: ?Pt completes ADL at overall MIN A level for UB ADLs only to don abdominal binder. Increased time provided, challenging pt to use abdominals to weight shift forward.  ? ?Therapeutic activity ?Pt completes squat pivot transfer tow/c with MAX A after donning teds and shoes. Pt introduced to SB and OT edu re head hips relationship, foot/hand placement and anterior weight shift. Initially pt needing up to MAX A to scoot across SB, however with blocked practice able to scoot with heavy MIN A and A to manage feet/tactile cues for moving hands. ? ?Pt left at end of session in w/c with exit alarm on, call light in reach and all needs met ? ? ?Therapy Documentation ?Precautions:  ?Precautions ?Precautions: Fall ?Precaution Comments: truncal and BLE limb incoordination; absent R LE sensation with R knee buckle ?Restrictions ?Weight Bearing Restrictions: No ?General: ?  ? ? ?Therapy/Group: Individual Therapy ? ?Richard Santos ?02/10/2022, 6:57 AM ?

## 2022-02-11 DIAGNOSIS — R5381 Other malaise: Secondary | ICD-10-CM | POA: Diagnosis not present

## 2022-02-11 DIAGNOSIS — R339 Retention of urine, unspecified: Secondary | ICD-10-CM | POA: Diagnosis not present

## 2022-02-11 DIAGNOSIS — I951 Orthostatic hypotension: Secondary | ICD-10-CM | POA: Diagnosis not present

## 2022-02-11 DIAGNOSIS — C7931 Secondary malignant neoplasm of brain: Secondary | ICD-10-CM | POA: Diagnosis not present

## 2022-02-11 LAB — URINE CULTURE: Culture: NO GROWTH

## 2022-02-11 NOTE — Progress Notes (Signed)
Physical Therapy Session Note ? ?Patient Details  ?Name: Richard Santos ?MRN: [id number] ?Date of Birth: [birth year] ? ?Today's Date: 02/11/2022 ?PT Individual Time: 2585-2778 ?PT Individual Time Calculation (min): 60 min ? ?Short Term Goals: ?Week 1:  PT Short Term Goal 1 (Week 1): Pt will perform supine<>sit with CGA ?PT Short Term Goal 2 (Week 1): Pt will perform sit<>stands using LRAD with min assist ?PT Short Term Goal 3 (Week 1): Pt will perform bed<>chair transfers using LRAD with min assist ?PT Short Term Goal 4 (Week 1): Pt will ambulate at least 59ft using LRAD with mod assist ?PT Short Term Goal 5 (Week 1): Pt will initiate stair training ? ?Skilled Therapeutic Interventions/Progress Updates:  ?   ?Patient in bed upon PT arrival. Patient alert and agreeable to PT session. Patient denied pain during session. Continues to report, "I cant' tell where my L leg is now."  ? ?Therapeutic Activity: ?Bed Mobility: Patient performed supine to sit with mod A in a flat bed with use of bed rail. Provided multimodal cues for sequencing, progressing through side-lying, and use of bottom elbow to push up to sitting. Patient sat EOB with min A progressing to supervision. Patient with posterior bias in sitting and reports fear of falling with leaning forward. Educated patient on anatomical counterbalance to prevent falling and patient able to reach his foot on the floor with encouragement and maintained improved sitting balance following.  ?Patient performed sit to supine with total A +2 due to patient fatigue and reduced motor planning on return to bed.  ?Transfers: Patient performed a slide board transfer bed>w/c with mod A and total for board placement. Provided cues for hand placement, board placement, and head-hips relationship for proper technique and decreased assist with transfers. Attempted slide board transfer back to bed, however, patient's hips slid far forward in the chair, unsafe for board placement, performed  total A +2 squat pivot back to bed, as patient was unable to scoot back for improved position for slide board with assistance or assist with transfer due to fatigue at end of session.  ? ?Neuromuscular Re-ed: ?Patient performed the following lower extremity motor control activities for improved functional mobility: ?-sit to stand x3 in // bars with max of 1 person and min of a second x1 and max of 1 x2 with R knee blocked, facilitated forward weight shift, R knee extension, and boosting up ?-performed standing balance with weight shift >30 sec each stand, attempted to have patient step forward with L x4, advanced with total A x1 with max A +2 for trunk support ? ?Spent increased time discussing patient's decline in progress with mobility since evaluation, mod A to stand with RW and gait 16 feet max A. Educated on POC incorporating strengthening and mobility as able to progress with additional focus on use of a lift for transfers. Updated safety sheet to reflect use of maxi-move for patient transfers with nursing staff, RN made aware.  ? ?Patient acknowledged functional decline and increased physical fatigue during session today and expressed appropriate questions about his medical status. Deferred questions to medical team at this time. Patient in agreement with POC changes based on current performance. Provided patient with coping strategies throughout discussion. Patient appreciative of discussion with somber mood after  ? ?Patient in bed at end of session with breaks locked, bed alarm set, and all needs within reach.  ? ?Therapy Documentation ?Precautions:  ?Precautions ?Precautions: Fall ?Precaution Comments: truncal and BLE limb incoordination; absent R LE sensation  with R knee buckle ?Restrictions ?Weight Bearing Restrictions: No ? ? ?Therapy/Group: Individual Therapy ? ?Doreene Burke PT, DPT ? ?02/11/2022, 4:10 AM  ?

## 2022-02-11 NOTE — Progress Notes (Addendum)
Patient ID: Richard Santos, male   DOB: [birth year], 61 y.o.   MRN: [id number] ? ?SW spoke with pt wife [name] [phone]) to provide updates from team conference. She reported that she was getting a procedure done and waiting on test results. SW discussed physical support pt will need at time of discharge and discussion about hoyer and hospital bed.She confirms it will just be the two of them. Amenable to [facility] if they will accept referral again.  Fam edu scheduled for Mon (3/27)-Wed (3/29) 1p-4pm. SW will f/u once DME is confirmed.  ? ?SW sent HHPT/OT/SLP/aide/?Sn referral to [facility] and waiting on follow-up.  ?*Reports no SLP or aide. SW checked with therapy team and no SLP needed. SW waiting to hear if OT can address aide needs.  ? ?[name], MSW, LCSWA ?Office: [phone] ?Cell: [phone] ?Fax: [phone]  ?

## 2022-02-11 NOTE — Progress Notes (Incomplete Revision)
Patient ID: Richard Santos, male   DOB: [birth year], 61 y.o.   MRN: [id number] ? ?SW spoke with pt wife Richard Santos [phone]) to provide updates from team conference. She reported that she was getting a procedure done and waiting on test results. SW discussed physical support pt will need at time of discharge and discussion about hoyer and hospital bed.She confirms it will just be the two of them. Amenable to [facility] if they will accept referral again.  Fam edu scheduled for Mon (3/27)-Wed (3/29) 1p-4pm. SW will f/u once DME is confirmed.  ? ?SW sent HHPT/OT/SLP/aide/?Sn referral to [facility] and waiting on follow-up.  ?*Reports no SLP or aide. SW checked with therapy team and no SLP needed. SW waiting to hear if OT can address aide needs.  ? ?[name], MSW, LCSWA ?Office: [phone] ?Cell: [phone] ?Fax: [phone]  ?

## 2022-02-11 NOTE — Progress Notes (Signed)
Occupational Therapy Session Note ? ?Patient Details  ?Name: Richard Santos ?MRN: [id number] ?Date of Birth: Oct 15, 1961 ? ?Session 1 ?Today's Date: 02/11/2022 ?OT Individual Time: 6195-0932 ?OT Individual Time Calculation (min): 42 min  ? ? ?Session 2 ?Today's Date: 02/11/2022 ?OT Individual Time: 1304-1400 ?OT Individual Time Calculation (min): 56 min  ? ? ?Short Term Goals: ?Week 1:  OT Short Term Goal 1 (Week 1): Pt will complete toilet transfer with min A ?OT Short Term Goal 2 (Week 1): Pt will complete toileting task with mod A ?OT Short Term Goal 3 (Week 1): Pt will engage in dynamic standing balance task without locking knees for 1 min ?OT Short Term Goal 4 (Week 1): Pt will complete LB dressing with mod A ? ?Skilled Therapeutic Interventions/Progress Updates:  ?  Pt received supine with mild stomach pain, unrated and no request for intervention. Pt agreeable to get OOB via slideboard. Mod cueing for facilitating bed mobility to side lying for sidelying to sitting EOB transfer, max A for BLE management and mod A to come into sitting. Once sitting extensor tone prevalent in trunk vs anxiety as pt heavy breathing. Calming cues and therapeutic use of self, as well as max A provided initially. Pt also benefited from hands on knees positioning to facilitate forward trunk flexion and was eventually able to maintain balance statically with min A. He required max A for slideboard transfer to the w/c. Increased time provided to allow pt to problem solve/motor plan through transfer but very little activation. In the therapy gym pt completed seated forward flexion slides to facilitate weight shift forward and for abdominal and spinal extension strengthening. Pt required mod cueing and min facilitation. He returned to his room and required max A to slideboard back to bed. He was left supine with all needs met, bed alarm set.  ? ? ?Session 2: ?Pt received supine with no c/o pain, agreeable to session. Pt came to sidelying  with facilitation for BLE positioning and cueing for technique, mod A overall to get into sitting EOB. Improved sitting balance initially with less extensor tone. Pt anxiety/fear of falling is a large component of his balance deficits at time, as he will brace his trunk against perturbations, often resulting in pushing backward and exaggerated righting reactions. He transferred to the w/c with max A using the slideboard, with extra time/effort to facilitate head-hips ratio and overcome anxiety of forward weight shift. He was taken via w/c to the therapy gym and he transferred to the mat with a similar assist level. From the EOM focused on trunk control with weight shifting forward, backward, and laterally- provided a mirror anteriorly as visual cue and pt required min-mod A overall to maintain static balance. Performed several activities to both strengthen trunk control and to improve confidence with balance perturbations. He returned to his w/c and then to bed via slideboard. He was left supine with all needs met, bed alarm set.  ? ?Therapy Documentation ?Precautions:  ?Precautions ?Precautions: Fall ?Precaution Comments: truncal and BLE limb incoordination; absent R LE sensation with R knee buckle ?Restrictions ?Weight Bearing Restrictions: No ? ?Therapy/Group: Individual Therapy ? ?Curtis Sites ?02/11/2022, 7:00 AM ?

## 2022-02-11 NOTE — Progress Notes (Signed)
Speech Language Pathology Discharge Summary ? ?Patient Details  ?Name: Richard Santos ?MRN: [id number] ?Date of Birth: 11/22/1961 ? ?Today's Date: 02/11/2022 ?SLP Individual Time: 4883-0141 ?SLP Individual Time Calculation (min): 45 min ? ?Skilled Therapeutic Interventions: Skilled ST treatment focused on cognitive goals. SLP facilitated functional conversation regarding cognitive baseline as continuation of yesterday's discussion. Patient again denied any changes to cognition and feels he is at his baseline at this time. Pt agreeable to calling spouse to further discuss this.Spouse contacted and endorsed that patient is at baseline and they both would like to see him focus on "working his legs." SLP provided education on strategies to maximize memory and provided patient with handout in which he placed in stroke binder. All education complete. Plan to discharge from speech services at this time. Patient was left in bed with alarm activated and immediate needs within reach at end of session.  ? ?Patient has met 2 of 3 long term goals.  Patient to discharge at overall Supervision level.  ?Reasons goals not met: Short SLP duration due to patient and spouse informing pt is at cognitive baseline with no further interest in pursuing speech goals at this time. Did not address problem solving goal.  ? ?Clinical Impression/Discharge Summary: Patient met 2 of 3 long-term goals this admission. Patient currently requires sup-to-min A for functional memory and sup A for sustained attention during functional tasks. SLP did not address problem solving goal due to short duration of services. Patient and spouse informed SLP that patient is at cognitive baseline and do not wish to further pursue speech services at this time in effort to focus on physical function. SLP provided education on strategies to maximize attention and memory for improved safety and independence. All education is complete and patient's care partner is  independent to provide the necessary physical and cognitive assistance at discharge. Follow up SLP services are not indicated at this time. ? ?Care Partner:  ?Caregiver Able to Provide Assistance: Yes  ?Type of Caregiver Assistance: Cognitive;Physical ? ?Recommendation:  ?24 hour supervision/assistance  ?Rationale for SLP Follow Up: Other (comment) (follow up SLP not recommended)  ? ?Equipment: None  ? ?Reasons for discharge: Other (comment) (Pt at cognitive baseline with no further interest in pursuing speech goals)  ? ?Patient/Family Agrees with Progress Made and Goals Achieved: Yes  ? ? ?Azir Muzyka T Yan Okray ?02/11/2022, 7:01 PM ? ?

## 2022-02-11 NOTE — Progress Notes (Signed)
?                                                       PROGRESS NOTE ? ? ?Subjective/Complaints: ?Able to sleep. Feels a little fatigued still this morning ? ?ROS: Patient denies fever, rash, sore throat, blurred vision, dizziness, nausea, vomiting, diarrhea, cough, shortness of breath or chest pain, joint or back/neck pain, headache, or mood change.  ? ?Objective: ?  ?No results found. ?Recent Labs  ?  02/09/22 ?0433  ?WBC 4.6  ?HGB 9.7*  ?HCT 28.8*  ?PLT 143*  ? ?Recent Labs  ?  02/09/22 ?0433  ?NA 132*  ?K 3.8  ?CL 101  ?CO2 25  ?GLUCOSE 92  ?BUN 11  ?CREATININE 0.62  ?CALCIUM 8.5*  ? ? ?Intake/Output Summary (Last 24 hours) at 02/11/2022 0836 ?Last data filed at 02/11/2022 8127 ?Gross per 24 hour  ?Intake 240 ml  ?Output 1075 ml  ?Net -835 ml  ?  ? ?  ? ?Physical Exam: ?Vital Signs ?Blood pressure 111/81, pulse 75, temperature 97.7 ?F (36.5 ?C), resp. rate 16, height 6' (1.829 m), weight 64.7 kg, SpO2 100 %. ? ? ?Constitutional: No distress . Vital signs reviewed. ?HEENT: NCAT, EOMI, oral membranes moist ?Neck: supple ?Cardiovascular: RRR without murmur. No JVD    ?Respiratory/Chest: CTA Bilaterally without wheezes or rales. Normal effort    ?GI/Abdomen: BS +, non-tender, non-distended ?Ext: no clubbing, cyanosis, or edema ?Psych: pleasant and cooperative  ?Musculoskeletal:  ?   Cervical back: Neck supple. No tenderness.  ?   Comments: Moving all extremities B/L- but obviously weak  ?Skin: ?   General: Skin is warm and dry.  ?   Comments: Scab on right knee and healing abrasions left knee.  ?Port that's accessed in R chest;--stable appearing ?  ?Neurological:  ?   Mental Status: awakened fairly easily.   ?   Comments: delayed processing. Fair awareness. Mild right facial weakness. Occasional intentional tremor RUE. RLE weakness. +- RLE sensory deficits. Delayed processing, STM deficits ? ?Assessment/Plan: ?1. Functional deficits which require 3+ hours per day of interdisciplinary therapy in a comprehensive  inpatient rehab setting. ?Physiatrist is providing close team supervision and 24 hour management of active medical problems listed below. ?Physiatrist and rehab team continue to assess barriers to discharge/monitor patient progress toward functional and medical goals ? ?Care Tool: ? ?Bathing ?   ?Body parts bathed by patient: Right arm, Left arm, Chest, Front perineal area, Abdomen, Buttocks, Right upper leg, Left upper leg, Face  ? Body parts bathed by helper: Left lower leg, Right lower leg ?  ?  ?Bathing assist Assist Level: Moderate Assistance - Patient 50 - 74% ?  ?  ?Upper Body Dressing/Undressing ?Upper body dressing   ?What is the patient wearing?: Piney only ?   ?Upper body assist Assist Level: Minimal Assistance - Patient > 75% ?   ?Lower Body Dressing/Undressing ?Lower body dressing ? ? ?   ?What is the patient wearing?: Pants ? ?  ? ?Lower body assist Assist for lower body dressing: Maximal Assistance - Patient 25 - 49% ?   ? ?Toileting ?Toileting Toileting Activity did not occur (Probation officer and hygiene only): N/A (no void or bm)  ?Toileting assist Assist for toileting: Total Assistance - Patient < 25% ?  ?  ?Transfers ?Chair/bed  transfer ? ?Transfers assist ? Chair/bed transfer activity did not occur: Safety/medical concerns ? ?Chair/bed transfer assist level: 2 Helpers ?  ?  ?Locomotion ?Ambulation ? ? ?Ambulation assist ? ? Ambulation activity did not occur: Safety/medical concerns (requires use of AD for safe attempt at ambulation with skilled assistance) ? ?  ?  ?   ? ?Walk 10 feet activity ? ? ?Assist ? Walk 10 feet activity did not occur: Safety/medical concerns ? ?  ?   ? ?Walk 50 feet activity ? ? ?Assist Walk 50 feet with 2 turns activity did not occur: Safety/medical concerns ? ?  ?   ? ? ?Walk 150 feet activity ? ? ?Assist Walk 150 feet activity did not occur: Safety/medical concerns ? ?  ?  ?  ? ?Walk 10 feet on uneven surface  ?activity ? ? ?Assist Walk 10 feet on uneven  surfaces activity did not occur: Safety/medical concerns ? ? ?  ?   ? ?Wheelchair ? ? ? ? ?Assist Is the patient using a wheelchair?: Yes ?Type of Wheelchair: Manual ?  ? ?Wheelchair assist level: Dependent - Patient 0% ?   ? ? ?Wheelchair 50 feet with 2 turns activity ? ? ? ?Assist ? ?  ?  ? ? ?Assist Level: Dependent - Patient 0%  ? ?Wheelchair 150 feet activity  ? ? ? ?Assist ?   ? ? ?Assist Level: Dependent - Patient 0%  ? ?Blood pressure 111/81, pulse 75, temperature 97.7 ?F (36.5 ?C), resp. rate 16, height 6' (1.829 m), weight 64.7 kg, SpO2 100 %. ? ?Medical Problem List and Plan: ?1. Functional deficits secondary to debility from metastatic Cancer ?            -patient may not shower- due to R chest port ?            -ELOS/Goals: 02/20/22- min A to supervision ? -Continue CIR therapies including PT, OT, and SLP  ?2.  Antithrombotics: ?-DVT/anticoagulation:  Pharmaceutical: Lovenox ?            -antiplatelet therapy: Low dose ASA.  ?3. Pain Management: Continue Tylenol prn.  ?4. Mood: LCSW to follow for evaluation and support.  ?            -antipsychotic agents: N/A ?5. Neuropsych: This patient may not be fully  capable of making decisions on his own behalf. SLUMs 11/30 ?6. Skin/Wound Care: routine pressure relief measures.  ?7. Fluids/Electrolytes/Nutrition: Monitor I/O. Check lytes in am. ?8.  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            --continue decadron 2 mg BID ? -outpt f/u with Dr. Julien Nordmann to decide upon plan ?9. Orthostatic hypotension: Continue to check orthostatic vitals.  ?            --TEDs and abdominal binder when out of bed. ?            --encourage fluid intake. ? 3/22- pushing fluids- BUN/Cr better ---recheck bmet monday ?10. Lung Cancer/COPD: Continue Dulera with incurse.  ?11. Suboptimal  Vitamin B12: Level-218-->received IV doses X 3-->po route daily.  ?12. Hypomagnesemia: Recheck Mg level in am. Improved from 1.5-->1.7 post supplement.  ?13. Urinary retention:  did not tolerate urecholine ? -foley replaced, pt feels better ? -UA neg, UCX STILL in process 3/22 ?  ?14. Pancytopenia:  .  ?  3/20- labs reviewed and stable--recheck friday ?  ?  ? ?LOS: ?5 days ?A FACE TO FACE EVALUATION  WAS PERFORMED ? ?Meredith Staggers ?02/11/2022, 8:36 AM  ? ? ? ? ?

## 2022-02-11 NOTE — Plan of Care (Signed)
?  Problem: RH Memory ?Goal: LTG Patient will demonstrate ability for day to day (SLP) ?Description: LTG:   Patient will demonstrate ability for day to day recall/carryover during cognitive/linguistic activities with assist  (SLP) ?02/11/2022 1852 by Charna Elizabeth T, CCC-SLP ?Outcome: Not Met (add Reason) ?Problem: RH Memory ?Goal: LTG Patient will use memory compensatory aids to (SLP) ?Description: LTG:  Patient will use memory compensatory aids to recall biographical/new, daily complex information with cues (SLP) ?Outcome: Completed/Met ?  ?Problem: RH Attention ?Goal: LTG Patient will demonstrate this level of attention during functional activites (SLP) ?Description: LTG:  Patient will will demonstrate this level of attention during functional activites (SLP) ?Outcome: Completed/Met ?  ?  ?

## 2022-02-12 DIAGNOSIS — R5381 Other malaise: Secondary | ICD-10-CM | POA: Diagnosis not present

## 2022-02-12 DIAGNOSIS — I951 Orthostatic hypotension: Secondary | ICD-10-CM | POA: Diagnosis not present

## 2022-02-12 DIAGNOSIS — R339 Retention of urine, unspecified: Secondary | ICD-10-CM | POA: Diagnosis not present

## 2022-02-12 DIAGNOSIS — C7931 Secondary malignant neoplasm of brain: Secondary | ICD-10-CM | POA: Diagnosis not present

## 2022-02-12 MED ORDER — DEXAMETHASONE 2 MG PO TABS
2.0000 mg | ORAL_TABLET | Freq: Once | ORAL | Status: AC
  Administered 2022-02-12: 2 mg via ORAL

## 2022-02-12 MED ORDER — DEXAMETHASONE 4 MG PO TABS
4.0000 mg | ORAL_TABLET | Freq: Two times a day (BID) | ORAL | Status: DC
  Administered 2022-02-13 – 2022-02-14 (×3): 4 mg via ORAL
  Filled 2022-02-12 (×3): qty 1

## 2022-02-12 NOTE — Progress Notes (Signed)
Physical Therapy Session Note ? ?Patient Details  ?Name: Richard Santos ?MRN: [id number] ?Date of Birth: Jul 04, 1961 ? ?Today's Date: 02/12/2022 ?PT Individual Time: 3474-2595 ?PT Individual Time Calculation (min): 42 min  ? ?Short Term Goals: ?Week 1:  PT Short Term Goal 1 (Week 1): Pt will perform supine<>sit with CGA ?PT Short Term Goal 2 (Week 1): Pt will perform sit<>stands using LRAD with min assist ?PT Short Term Goal 3 (Week 1): Pt will perform bed<>chair transfers using LRAD with min assist ?PT Short Term Goal 4 (Week 1): Pt will ambulate at least 8ft using LRAD with mod assist ?PT Short Term Goal 5 (Week 1): Pt will initiate stair training ?Week 2:    ?Week 3:    ? ?Skilled Therapeutic Interventions/Progress Updates:  ?  pT initially supine, agreeable to session. ?Total assist to don [facility]. ?PROM bilat Les performed by PT during assessment of motor function this am. ?Pt rolls w/max assist for lower body/legs, uses rails to assist w/upper body. ?Side to sit w/max assist and cues, leans posteriorly/fearful of falling forward. Overall mod assist w/static sitting.  Worked on ant wt shift via propping hands on knees, advancing to therapist shoulders (kneeling in front of pt).  Pt relies heavily on UE support while therapist dons shoes/total assist. ? ?Transfers: ?Max assist for board placement, max cues for wt shifting to L elbow for board placement under r hip, mod assist for stability. ?sliding board transfer to wc w/total assist, max cues for hand placement, head/hips relationship w/transfer, pt unable to effectively boost or wt shift during transfer. ? ?Wc set up:  pt instructed w/removal and replacement of armrests. And operation of brakes.  Pt able to lock/unclock brakes w/cues using extenders.  Pt instructed w/tilt feature of wc.  Armrest height adjusted for pt.  Pt may need thicker cushion due to increased hip flexion >90* w/current cushion and limited ability to extend legrests further without  interference w/ floor/propulsion. ? ?Wc propulsion x 171ft w/supervision.   ? ?Pt requested to "work on waking up legs".  Pt w/trace movement L hip abd/add, R hip add, L quads, L ankle DF but fatigues quickly w/attempted AAROM therex.   ? ?Pt left oob in wc w/alarm belt set and needs in reach ? ? ?Therapy Documentation ?Precautions:  ?Precautions ?Precautions: Fall ?Precaution Comments: truncal and BLE limb incoordination; absent R LE sensation with R knee buckle ?Restrictions ?Weight Bearing Restrictions: No ?  ? ?Therapy/Group: Individual Therapy ?[name], PT ? ? ?[name] ?02/12/2022, 12:51 PM  ?

## 2022-02-12 NOTE — Discharge Instructions (Addendum)
Inpatient Rehab Discharge Instructions ? ?Richard Santos ?Discharge date and time: 02/20/22  ? ?Activities/Precautions/ Functional Status: ?Activity: activity as tolerated ?Diet: regular diet ?Wound Care: none needed ? ? ?Functional status:  ?___ No restrictions     ___ Walk up steps independently ?_X__ 24/7 supervision/assistance   ___ Walk up steps with assistance ?___ Intermittent supervision/assistance  ___ Bathe/dress independently ?___ Walk with walker     ___ Bathe/dress with assistance ?___ Walk Independently    ___ Shower independently ?___ Walk with assistance    _X__ Shower with assistance ?_X__ No alcohol     ___ Return to work/school ________ ? ?Special Instructions: ? ?COMMUNITY REFERRALS UPON DISCHARGE:   ? ?Home Health:   PT      OT        ?               Agency: [facility] Phone: [phone] ?*Please expect follow-up within 2-3 days of your discharge to schedule your home visit. If you have not received follow-up, be sure to contact the branch directly.* ? ?Medical Equipment/Items Ordered:  ?                                                Agency/Supplier:  ? ? ? ?My questions have been answered and I understand these instructions. I will adhere to these goals and the provided educational materials after my discharge from the hospital. ? ?Patient/Caregiver Signature _______________________________ Date __________ ? ?Clinician Signature _______________________________________ Date __________ ? ?Please bring this form and your medication list with you to all your follow-up doctor's appointments.   ? ?

## 2022-02-12 NOTE — Progress Notes (Signed)
Occupational Therapy Session Note ? ?Patient Details  ?Name: Richard Santos ?MRN: [id number] ?Date of Birth: 10-07-1961 ? ?Today's Date: 02/12/2022 ?OT Individual Time: 1100-1200 ?OT Individual Time Calculation (min): 60 min  ? ? ?Short Term Goals: ?Week 1:  OT Short Term Goal 1 (Week 1): Pt will complete toilet transfer with min A ?OT Short Term Goal 2 (Week 1): Pt will complete toileting task with mod A ?OT Short Term Goal 3 (Week 1): Pt will engage in dynamic standing balance task without locking knees for 1 min ?OT Short Term Goal 4 (Week 1): Pt will complete LB dressing with mod A ? ?Skilled Therapeutic Interventions/Progress Updates:  ?   ?Pt received in w/c with no pain reported.  ? ?Therapeutic activity ?Pt agreeable to work on adaptive strategies for bed mobility, LE management, and supine to sit in prep for BADLs. Pt completes SB transfers to/from EOB/EOM and w/c with MAX A for uphill and MOD A for downhill. Pt with poor clearance on board demo-ing decreased LE activation this date. Backwards chaining for bed mobility using leg loops (leg lifter too short). Pt completes elbow>sitting upright B directions 4x with MIN A. Pt able to use leg loops to bing Les to chest in prep for rolling. Pt able to roll in B direction from hooklying with MIN A and then requires A to kick Les off the bed and MOD A to elevate trunk and MAX cuing. Pt would benefit from training in circle sitting suported and long sitting for BADL prep ? ?Pt left at end of session in bed with exit alarm on, call light in reach and all needs met ? ? ?Therapy Documentation ?Precautions:  ?Precautions ?Precautions: Fall ?Precaution Comments: truncal and BLE limb incoordination; absent R LE sensation with R knee buckle ?Restrictions ?Weight Bearing Restrictions: No ?General: ?  ? ? ?Therapy/Group: Individual Therapy ? ?Lowella Dell Jayce Kainz ?02/12/2022, 7:07 AM ?

## 2022-02-12 NOTE — Progress Notes (Addendum)
?                                                       PROGRESS NOTE ? ? ?Subjective/Complaints: ?No new issues overnight. Still presenting with ongoing fatigue, increased left leg weakness and in general hasn't been performing as well with therapies. Pt is aware of decline ? ?ROS: Limited due to cognitive/behavioral  ? ?Objective: ?  ?No results found. ?No results for input(s): WBC, HGB, HCT, PLT in the last 72 hours. ? ?No results for input(s): NA, K, CL, CO2, GLUCOSE, BUN, CREATININE, CALCIUM in the last 72 hours. ? ? ?Intake/Output Summary (Last 24 hours) at 02/12/2022 0958 ?Last data filed at 02/12/2022 0830 ?Gross per 24 hour  ?Intake 360 ml  ?Output 1515 ml  ?Net -1155 ml  ?  ? ?  ? ?Physical Exam: ?Vital Signs ?Blood pressure 107/71, pulse 72, temperature 97.7 ?F (36.5 ?C), temperature source Oral, resp. rate 19, height 6' (1.829 m), weight 64.7 kg, SpO2 100 %. ? ? ?Constitutional: No distress . Vital signs reviewed. ?HEENT: NCAT, EOMI, oral membranes moist ?Neck: supple ?Cardiovascular: RRR without murmur. No JVD    ?Respiratory/Chest: CTA Bilaterally without wheezes or rales. Normal effort    ?GI/Abdomen: BS +, non-tender, non-distended ?Ext: no clubbing, cyanosis, or edema ?Psych: pleasant and cooperative  ?Musculoskeletal:  ?   Cervical back: Neck supple. No tenderness.  ?   Comments: Moving all extremities B/L- but obviously weak  ?Skin: ?   General: Skin is warm and dry.  ?   Comments: Scab on right knee and healing abrasions left knee.  ?Port that's accessed in R chest;--stable appearing ?  ?Neurological:  ?   Mental Status: awakened fairly easily.   ?   Comments: delayed processing. Fair awareness. Mild right facial weakness. Occasional intentional tremor RUE.   LLE sensory deficits for LT/pain/proprioception. Struggled lifting left leg off leg rest of w/c today, poor motor planning. Delayed processing, STM deficits ? ?Assessment/Plan: ?1. Functional deficits which require 3+ hours per day of  interdisciplinary therapy in a comprehensive inpatient rehab setting. ?Physiatrist is providing close team supervision and 24 hour management of active medical problems listed below. ?Physiatrist and rehab team continue to assess barriers to discharge/monitor patient progress toward functional and medical goals ? ?Care Tool: ? ?Bathing ?   ?Body parts bathed by patient: Right arm, Left arm, Chest, Front perineal area, Abdomen, Buttocks, Right upper leg, Left upper leg, Face  ? Body parts bathed by helper: Left lower leg, Right lower leg ?  ?  ?Bathing assist Assist Level: Moderate Assistance - Patient 50 - 74% ?  ?  ?Upper Body Dressing/Undressing ?Upper body dressing   ?What is the patient wearing?: [place] only ?   ?Upper body assist Assist Level: Minimal Assistance - Patient > 75% ?   ?Lower Body Dressing/Undressing ?Lower body dressing ? ? ?   ?What is the patient wearing?: Pants ? ?  ? ?Lower body assist Assist for lower body dressing: Maximal Assistance - Patient 25 - 49% ?   ? ?Toileting ?Toileting Toileting Activity did not occur (Probation officer and hygiene only): N/A (no void or bm)  ?Toileting assist Assist for toileting: Total Assistance - Patient < 25% ?  ?  ?Transfers ?Chair/bed transfer ? ?Transfers assist ? Chair/bed transfer activity did not  occur: Safety/medical concerns ? ?Chair/bed transfer assist level: 2 Helpers ?  ?  ?Locomotion ?Ambulation ? ? ?Ambulation assist ? ? Ambulation activity did not occur: Safety/medical concerns (requires use of AD for safe attempt at ambulation with skilled assistance) ? ?  ?  ?   ? ?Walk 10 feet activity ? ? ?Assist ? Walk 10 feet activity did not occur: Safety/medical concerns ? ?  ?   ? ?Walk 50 feet activity ? ? ?Assist Walk 50 feet with 2 turns activity did not occur: Safety/medical concerns ? ?  ?   ? ? ?Walk 150 feet activity ? ? ?Assist Walk 150 feet activity did not occur: Safety/medical concerns ? ?  ?  ?  ? ?Walk 10 feet on uneven surface   ?activity ? ? ?Assist Walk 10 feet on uneven surfaces activity did not occur: Safety/medical concerns ? ? ?  ?   ? ?Wheelchair ? ? ? ? ?Assist Is the patient using a wheelchair?: Yes ?Type of Wheelchair: Manual ?  ? ?Wheelchair assist level: Dependent - Patient 0% ?   ? ? ?Wheelchair 50 feet with 2 turns activity ? ? ? ?Assist ? ?  ?  ? ? ?Assist Level: Dependent - Patient 0%  ? ?Wheelchair 150 feet activity  ? ? ? ?Assist ?   ? ? ?Assist Level: Dependent - Patient 0%  ? ?Blood pressure 107/71, pulse 72, temperature 97.7 ?F (36.5 ?C), temperature source Oral, resp. rate 19, height 6' (1.829 m), weight 64.7 kg, SpO2 100 %. ? ?Medical Problem List and Plan: ?1. Functional deficits secondary to debility from metastatic Cancer ?            -patient may not shower- due to R chest port ?            -ELOS/Goals: 02/20/22- min A to supervision ? -Continue CIR therapies including PT, OT, and SLP. Pt has been demonstrating a functional decline this week. Team needs to shift focus of rehab to education/compensatory strategies/equipment. ?  -i've reached out to oncology re: concerns ?  -i've disussed with pt as well ?  -labs unremarkable ?2.  Antithrombotics: ?-DVT/anticoagulation:  Pharmaceutical: Lovenox ?            -antiplatelet therapy: Low dose ASA.  ?3. Pain Management: Continue Tylenol prn.  ?4. Mood: LCSW to follow for evaluation and support.  ?            -antipsychotic agents: N/A ?5. Neuropsych: This patient may not be fully  capable of making decisions on his own behalf. SLUMs 11/30 ?6. Skin/Wound Care: routine pressure relief measures.  ?7. Fluids/Electrolytes/Nutrition: Monitor I/O. Check lytes in am. ?8.  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            --continue decadron 2 mg BID ? -outpt f/u with Dr. Julien Nordmann to decide upon plan ? -see #1 ?9. Orthostatic hypotension: Continue to check orthostatic vitals.  ?            --TEDs and abdominal binder when out of bed. ?            --encourage  fluid intake. ? 3/22- pushing fluids- BUN/Cr better ---recheck bmet monday ?10. Lung Cancer/COPD: Continue Dulera with incurse.  ?11. Suboptimal  Vitamin B12: Level-218-->received IV doses X 3-->po route daily.  ?12. Hypomagnesemia:supplemented ?13. Urinary retention: did not tolerate urecholine ? -foley replaced, pt feels better ? -UA neg, UCX neg also ?  ?14. Pancytopenia:  .  ?  3/20- labs reviewed and stable--recheck 3/24 ?  ?  ? ?LOS: ?6 days ?A FACE TO FACE EVALUATION WAS PERFORMED ? ?Meredith Staggers ?02/12/2022, 9:58 AM  ? ? ? ? ?

## 2022-02-12 NOTE — Progress Notes (Signed)
Patient ID: Richard Santos, male   DOB: 21-Mar-1961, 61 y.o.   MRN: [id number] ? ?SW received updates from [facility] only teaches how to manage personal care needs, they do not perform any tasks. States no speech.  ? ?*SW confirmed with therapy team no SLP needed.  ? ?SW discussed with pt wife [name] to inform no aide will be available with [facility]. States she would like SW to explore new HHA.  ?*SW later received message from pt wife stating she would like to keep [facility].  ? ?SW updated [place] that they will remain with their agency.  ? ?[name], MSW, LCSWA ?Office: [phone] ?Cell: [phone] ?Fax: [phone]  ?

## 2022-02-12 NOTE — Progress Notes (Signed)
Occupational Therapy Session Note ? ?Patient Details  ?Name: Richard Santos ?MRN: [id number] ?Date of Birth: 1961/02/06 ? ?Today's Date: 02/12/2022 ?OT Individual Time: 6754-4920 ?OT Individual Time Calculation (min): 71 min  ? ? ?Short Term Goals: ?Week 1:  OT Short Term Goal 1 (Week 1): Pt will complete toilet transfer with min A ?OT Short Term Goal 2 (Week 1): Pt will complete toileting task with mod A ?OT Short Term Goal 3 (Week 1): Pt will engage in dynamic standing balance task without locking knees for 1 min ?OT Short Term Goal 4 (Week 1): Pt will complete LB dressing with mod A ? ?Skilled Therapeutic Interventions/Progress Updates:  ?  Pt received supine with no c/o pain, agreeable to ADL focused session. Facilitated BLE into knee flexion on bed and encouraged pt to attempt bridge position to doff LB clothing ,there was slight glute activation but little movement against gravity with max facilitation distally. Pt demonstrating anxious behavior with bed mobility as he does OOB activity. He rolled R and L with heavy use of bed rails and OT facilitating LE placement with mod A overall. Max A for posterior peri hygiene d/t pt being unaware of incontinent BM- several firm piece of stool. He completed anterior hygiene and was provided edu on use of LH sponge. He required mod A to come into sitting EOB with improved motor planning in using his BUE to push up into sitting. Heavy posterior bias once sitting and pt obviously anxious re falling. He was provided extra time to acclimate EOB, but was unable to do better than min a for sitting balance despite multiple attempts at cueing and facilitating forward trunk shift. He completed UB dressing with mod A for sitting balance but no assist to don sweater. Max A for slideboard transfer to the w/c. He completed oral care at the sink with set up assist. Pt was taken via w/c to the therapy gym. He used the standing frame x2 intervals for 30-45 seconds each before needing to  sit d/t pain in his R anterior thigh. Provided edu on benefits of weightbearing. He returned to the w/c and was left sitting up with all needs met. W/c tilted back for safety and chair alarm belt on. NT alerted to pt request to return to bed in ~30 min.  ? ?Therapy Documentation ?Precautions:  ?Precautions ?Precautions: Fall ?Precaution Comments: truncal and BLE limb incoordination; absent R LE sensation with R knee buckle ?Restrictions ?Weight Bearing Restrictions: No ? ? ? ?Therapy/Group: Individual Therapy ? ?Curtis Sites ?02/12/2022, 6:36 AM ?

## 2022-02-13 ENCOUNTER — Ambulatory Visit (HOSPITAL_COMMUNITY): Payer: 59

## 2022-02-13 ENCOUNTER — Inpatient Hospital Stay: Payer: 59

## 2022-02-13 DIAGNOSIS — C7931 Secondary malignant neoplasm of brain: Secondary | ICD-10-CM | POA: Diagnosis not present

## 2022-02-13 DIAGNOSIS — Z7189 Other specified counseling: Secondary | ICD-10-CM | POA: Diagnosis not present

## 2022-02-13 DIAGNOSIS — R339 Retention of urine, unspecified: Secondary | ICD-10-CM | POA: Diagnosis not present

## 2022-02-13 DIAGNOSIS — R5381 Other malaise: Secondary | ICD-10-CM | POA: Diagnosis not present

## 2022-02-13 DIAGNOSIS — I951 Orthostatic hypotension: Secondary | ICD-10-CM | POA: Diagnosis not present

## 2022-02-13 DIAGNOSIS — Z515 Encounter for palliative care: Secondary | ICD-10-CM

## 2022-02-13 LAB — CBC
HCT: 29.4 % — ABNORMAL LOW (ref 39.0–52.0)
Hemoglobin: 10.4 g/dL — ABNORMAL LOW (ref 13.0–17.0)
MCH: 36.2 pg — ABNORMAL HIGH (ref 26.0–34.0)
MCHC: 35.4 g/dL (ref 30.0–36.0)
MCV: 102.4 fL — ABNORMAL HIGH (ref 80.0–100.0)
Platelets: 175 10*3/uL (ref 150–400)
RBC: 2.87 MIL/uL — ABNORMAL LOW (ref 4.22–5.81)
RDW: 13.3 % (ref 11.5–15.5)
WBC: 7.9 10*3/uL (ref 4.0–10.5)
nRBC: 0 % (ref 0.0–0.2)

## 2022-02-13 LAB — BASIC METABOLIC PANEL
Anion gap: 9 (ref 5–15)
BUN: 25 mg/dL — ABNORMAL HIGH (ref 6–20)
CO2: 24 mmol/L (ref 22–32)
Calcium: 9.2 mg/dL (ref 8.9–10.3)
Chloride: 99 mmol/L (ref 98–111)
Creatinine, Ser: 0.67 mg/dL (ref 0.61–1.24)
GFR, Estimated: >60 mL/min (ref >60)
Glucose, Bld: 103 mg/dL — ABNORMAL HIGH (ref 70–99)
Potassium: 4.3 mmol/L (ref 3.5–5.1)
Sodium: 132 mmol/L — ABNORMAL LOW (ref 135–145)

## 2022-02-13 MED ORDER — CHLORHEXIDINE GLUCONATE CLOTH 2 % EX PADS
6.0000 | MEDICATED_PAD | Freq: Two times a day (BID) | CUTANEOUS | Status: DC
  Administered 2022-02-13 – 2022-02-20 (×13): 6 via TOPICAL

## 2022-02-13 NOTE — Plan of Care (Signed)
Goals discontinued/modified d/t disease progression +progress ? ?SMS OT ? ?Problem: RH Bathing ?Goal: LTG Patient will bathe all body parts with assist levels (OT) ?Description: LTG: Patient will bathe all body parts with assist levels (OT) ?Flowsheets (Taken 02/13/2022 1223) ?LTG: Pt will perform bathing with assistance level/cueing: Moderate Assistance - Patient 50 - 74% ?  ?Problem: RH Dressing ?Goal: LTG Patient will perform upper body dressing (OT) ?Description: LTG Patient will perform upper body dressing with assist, with/without cues (OT). ?Flowsheets (Taken 02/13/2022 1223) ?LTG: Pt will perform upper body dressing with assistance level of: Moderate Assistance - Patient 50 - 74% ?Goal: LTG Patient will perform lower body dressing w/assist (OT) ?Description: LTG: Patient will perform lower body dressing with assist, with/without cues in positioning using equipment (OT) ?Flowsheets (Taken 02/13/2022 1223) ?LTG: Pt will perform lower body dressing with assistance level of: Maximal Assistance - Patient 25 - 49% ?  ?Problem: RH Dressing ?Goal: LTG Patient will perform lower body dressing w/assist (OT) ?Description: LTG: Patient will perform lower body dressing with assist, with/without cues in positioning using equipment (OT) ?Flowsheets (Taken 02/13/2022 1223) ?LTG: Pt will perform lower body dressing with assistance level of: Maximal Assistance - Patient 25 - 49% ?  ?Problem: RH Toileting ?Goal: LTG Patient will perform toileting task (3/3 steps) with assistance level (OT) ?Description: LTG: Patient will perform toileting task (3/3 steps) with assistance level (OT)  ?Outcome: Not Applicable ?  ?Problem: RH Tub/Shower Transfers ?Goal: LTG Patient will perform tub/shower transfers w/assist (OT) ?Description: LTG: Patient will perform tub/shower transfers with assist, with/without cues using equipment (OT) ?Outcome: Not Applicable ?  ?Problem: RH Pre-functional/Other (Specify) ?Goal: RH LTG OT (Specify)  1 ?Description: RH LTG OT (Specify) 1 ?Flowsheets (Taken 02/13/2022 1225) ?LTG: Other OT (Specify) 1: Pt will roll in B directions with S to decrease BOC wiht toileting tasks and bed level dressing ?Goal: RH LTG OT (Specify) 2 ?Description: RH LTG OT (Specify) 2 ? ?Flowsheets (Taken 02/13/2022 1225) ?LTG: Other OT (Specify) 2: Pt will direct care during hoyer transfers with caregiver with Supervision ?  ?

## 2022-02-13 NOTE — Progress Notes (Signed)
Physical Therapy Session Note ? ?Patient Details  ?Name: Richard Santos ?MRN: [id number] ?Date of Birth: 03/03/1961 ? ?Today's Date: 02/13/2022 ?PT Individual Time: 0800-0900 ?PT Individual Time Calculation (min): 60 min  ? ?Short Term Goals: ?Week 1:  PT Short Term Goal 1 (Week 1): Pt will perform supine<>sit with CGA ?PT Short Term Goal 2 (Week 1): Pt will perform sit<>stands using LRAD with min assist ?PT Short Term Goal 3 (Week 1): Pt will perform bed<>chair transfers using LRAD with min assist ?PT Short Term Goal 4 (Week 1): Pt will ambulate at least 31ft using LRAD with mod assist ?PT Short Term Goal 5 (Week 1): Pt will initiate stair training ?Week 2:    ?Week 3:    ? ?Skilled Therapeutic Interventions/Progress Updates:  ? Teds donned total assist. ?Les 0/5 strength today, reports absent LE sensation, decreased trunk to around T5. ?Supine to sit w/max assist.  Continues to demonstrate post lean and fear of falling.  Worked on sitting balance, relies heavily on UE support for static sitting. ?Total assist for slidingboard placement, max assist for dynamic sitting balance. ?Total assist sliding board transfer to wc  ? ?Pt propels mod I, slowly, >145ft w/bilat Ues including turning. ?Therapist obtained [name] 2 cushion for improved pressure relief iand poisitioning in wc light of increasing sensory loss. ?Wc to mat total assis tof 2, cushions exchanged by therapist, mat to wc total assist of 2 sliding board transfer ?Leg rests extended to improve pressure distribution but due to pt height, will need further lengthening for optimal pressure relief.   ? ?Pt left oob in wc w/alarm belt set and needs in reach ? ? ?Therapy Documentation ?Precautions:  ?Precautions ?Precautions: Fall ?Precaution Comments: truncal and BLE limb incoordination; absent R LE sensation with R knee buckle ?Restrictions ?Weight Bearing Restrictions: No ? ? ? ?Therapy/Group: Individual Therapy ?[name], PT ? ? ?[name] ?02/13/2022, 12:42 PM  ?

## 2022-02-13 NOTE — Progress Notes (Signed)
Occupational Therapy Session Note ? ?Patient Details  ?Name: Richard Santos ?MRN: [id number] ?Date of Birth: 01-13-1961 ? ?Today's Date: 02/13/2022 ?OT Individual Time: 1130-1200 ?OT Individual Time Calculation (min): 30 min  and Today's Date: 02/13/2022 ?OT Group Time: 1000-1100 ?OT Group Time Calculation (min): 60 min ? ? ?Short Term Goals: ?Week 1:  OT Short Term Goal 1 (Week 1): Pt will complete toilet transfer with min A ?OT Short Term Goal 2 (Week 1): Pt will complete toileting task with mod A ?OT Short Term Goal 3 (Week 1): Pt will engage in dynamic standing balance task without locking knees for 1 min ?OT Short Term Goal 4 (Week 1): Pt will complete LB dressing with mod A ? ?Skilled Therapeutic Interventions/Progress Updates:  ?   ?Pt received in w/c with no pain reported Pt participated in rhythmic drumming group. Focus of group on BUE coordination, strengthening, endurance, timing/control, activity tolerance, and social participation and engagement. Pt performs session from seated position for energy conservation. Skilled interventions included chair tilted slightly back from neutra. Warm up performed prior to exercises and UB stretching completed at end of group with demo from OT. Pt able to select preferred song to share with group. Returned pt to room at end of session. MAX A SB back to bed with +2 present for safety. Sit>sup with TOTAL A for LE management Exited session with pt seated in bed, exit alarm on and call light in reach ? ? ?Session 2: ? ?Pt received in bed with no pain. Focus of session on BLE management, circle sitting, sitting balance and LB dressing. Pt able to use gait belts around BLE to pull into cirlce sitting with MIN A. Pt uses B bed rails to sit forward and reach towards feet. Pt dons B socks with up to MOD A for sitting balance during 2 posterior LOB requiring MIN A to don B socks.  ?Pt left at end of session in bed with exit alarm on, call light in reach and all needs  met ? ? ?Therapy Documentation ?Precautions:  ?Precautions ?Precautions: Fall ?Precaution Comments: truncal and BLE limb incoordination; absent R LE sensation with R knee buckle ?Restrictions ?Weight Bearing Restrictions: No ? ? ? ?Therapy/Group: Individual Therapy + Group ? ?[name] ?02/13/2022, 7:00 AM ?

## 2022-02-13 NOTE — Progress Notes (Addendum)
?                                                       PROGRESS NOTE ? ? ?Subjective/Complaints: ?Pt up with PT. Legs still feel numb. Able to sleep. Pain controlled ? ?ROS: Patient denies fever, rash, sore throat, blurred vision, dizziness, nausea, vomiting, diarrhea, cough, shortness of breath or chest pain, joint or back/neck pain, headache, or mood change.  ? ?Objective: ?  ?No results found. ?Recent Labs  ?  02/13/22 ?0601  ?WBC 7.9  ?HGB 10.4*  ?HCT 29.4*  ?PLT 175  ? ? ?Recent Labs  ?  02/13/22 ?0601  ?NA 132*  ?K 4.3  ?CL 99  ?CO2 24  ?GLUCOSE 103*  ?BUN 25*  ?CREATININE 0.67  ?CALCIUM 9.2  ? ? ? ?Intake/Output Summary (Last 24 hours) at 02/13/2022 1019 ?Last data filed at 02/13/2022 0800 ?Gross per 24 hour  ?Intake 360 ml  ?Output 525 ml  ?Net -165 ml  ?  ? ?  ? ?Physical Exam: ?Vital Signs ?Blood pressure (!) 102/54, pulse 93, temperature 98.4 ?F (36.9 ?C), temperature source Oral, resp. rate 18, height 6' (1.829 m), weight 64.7 kg, SpO2 99 %. ? ? ?Constitutional: No distress . Vital signs reviewed. ?HEENT: NCAT, EOMI, oral membranes moist ?Neck: supple ?Cardiovascular: RRR without murmur. No JVD    ?Respiratory/Chest: CTA Bilaterally without wheezes or rales. Normal effort    ?GI/Abdomen: BS +, non-tender, non-distended ?Ext: no clubbing, cyanosis, or edema ?Psych: pleasant and cooperative  ?Musculoskeletal:  ?   Cervical back: Neck supple. No tenderness.  ? ?Skin: ?   General: Skin is warm and dry.  ?   Comments: Scab on right knee and healing abrasions left knee.  ?Port that's accessed in R chest;--stable appearing ?  ?Neurological:  ?   Mental Status: awakened fairly easily.   ?   Comments: delayed processing. Fair awareness. Mild right facial weakness. Occasional intentional tremor RUE.  Now has BLE sensory deficits for LT/pain/proprioception, ?T10 sensory level. Unable to lift either leg off leg rests of w/c. UE 4/5. No sensory findings ? ?Assessment/Plan: ?1. Functional deficits which require 3+  hours per day of interdisciplinary therapy in a comprehensive inpatient rehab setting. ?Physiatrist is providing close team supervision and 24 hour management of active medical problems listed below. ?Physiatrist and rehab team continue to assess barriers to discharge/monitor patient progress toward functional and medical goals ? ?Care Tool: ? ?Bathing ?   ?Body parts bathed by patient: Right arm, Left arm, Chest, Front perineal area, Abdomen, Buttocks, Right upper leg, Left upper leg, Face  ? Body parts bathed by helper: Left lower leg, Right lower leg ?  ?  ?Bathing assist Assist Level: Moderate Assistance - Patient 50 - 74% ?  ?  ?Upper Body Dressing/Undressing ?Upper body dressing   ?What is the patient wearing?: [place] only ?   ?Upper body assist Assist Level: Minimal Assistance - Patient > 75% ?   ?Lower Body Dressing/Undressing ?Lower body dressing ? ? ?   ?What is the patient wearing?: Pants ? ?  ? ?Lower body assist Assist for lower body dressing: Maximal Assistance - Patient 25 - 49% ?   ? ?Toileting ?Toileting Toileting Activity did not occur (Probation officer and hygiene only): N/A (no void or bm)  ?Toileting assist Assist  for toileting: Total Assistance - Patient < 25% ?  ?  ?Transfers ?Chair/bed transfer ? ?Transfers assist ? Chair/bed transfer activity did not occur: Safety/medical concerns ? ?Chair/bed transfer assist level: 2 Helpers ?  ?  ?Locomotion ?Ambulation ? ? ?Ambulation assist ? ? Ambulation activity did not occur: Safety/medical concerns (requires use of AD for safe attempt at ambulation with skilled assistance) ? ?  ?  ?   ? ?Walk 10 feet activity ? ? ?Assist ? Walk 10 feet activity did not occur: Safety/medical concerns ? ?  ?   ? ?Walk 50 feet activity ? ? ?Assist Walk 50 feet with 2 turns activity did not occur: Safety/medical concerns ? ?  ?   ? ? ?Walk 150 feet activity ? ? ?Assist Walk 150 feet activity did not occur: Safety/medical concerns ? ?  ?  ?  ? ?Walk 10 feet on  uneven surface  ?activity ? ? ?Assist Walk 10 feet on uneven surfaces activity did not occur: Safety/medical concerns ? ? ?  ?   ? ?Wheelchair ? ? ? ? ?Assist Is the patient using a wheelchair?: Yes ?Type of Wheelchair: Manual ?  ? ?Wheelchair assist level: Dependent - Patient 0% ?   ? ? ?Wheelchair 50 feet with 2 turns activity ? ? ? ?Assist ? ?  ?  ? ? ?Assist Level: Dependent - Patient 0%  ? ?Wheelchair 150 feet activity  ? ? ? ?Assist ?   ? ? ?Assist Level: Dependent - Patient 0%  ? ?Blood pressure (!) 102/54, pulse 93, temperature 98.4 ?F (36.9 ?C), temperature source Oral, resp. rate 18, height 6' (1.829 m), weight 64.7 kg, SpO2 99 %. ? ?Medical Problem List and Plan: ?1. Functional deficits secondary to debility from metastatic Cancer ?            -patient may not shower- due to R chest port ?            -ELOS/Goals: 02/20/22- min A to supervision ? -Continue CIR therapies including PT, OT, and SLP. Team is shifting focus of rehab to education/compensatory strategies/equipment. ?  - oncology aware of decline ?  -I increased decadron to 4mg  bid after communicating with onc ?  -i've disussed with pt as well ?  -labs unremarkable ?  -presentation suggestive of cord involvement. Not sure we want to pursue further w/u however. 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  ?2.  Antithrombotics: ?-DVT/anticoagulation:  Pharmaceutical: Lovenox ?            -antiplatelet therapy: Low dose ASA.  ?3. Pain Management: Continue Tylenol prn.  ?4. Mood: LCSW to follow for evaluation and support.  ?            -antipsychotic agents: N/A ?5. Neuropsych: This patient may not be fully  capable of making decisions on his own behalf. SLUMs 11/30 ?6. Skin/Wound Care: routine pressure relief measures.  ?7. Fluids/Electrolytes/Nutrition:   ?-BUN elevated 3/24---push fluids. ?8.  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            --continue decadron 4 mg  BID ? -outpt f/u with Dr. Julien Nordmann to decide upon plan ? -see #1 ?9. Orthostatic hypotension: Continue to check orthostatic vitals.  ?            --TEDs and abdominal binder when out of bed. ?            --  encourage fluid intake. ? 3/22- pushing fluids- BUN/Cr better -  ?10. Lung Cancer/COPD: Continue Dulera with incurse.  ?11. Suboptimal  Vitamin B12: Level-218-->received IV doses X 3-->po route daily.  ?12. Hypomagnesemia:supplemented ?13. Urinary retention: did not tolerate urecholine ? -foley replaced, pt feels better ? -UA neg, UCX neg also ?  ?14. Pancytopenia:  .  ?   -labs stable to improved 3/24 ?  ?  ? ?LOS: ?7 days ?A FACE TO FACE EVALUATION WAS PERFORMED ? ?Meredith Staggers ?02/13/2022, 10:19 AM  ? ? ? ? ?

## 2022-02-13 NOTE — Plan of Care (Signed)
?  Problem: Consults ?Goal: RH GENERAL PATIENT EDUCATION ?Description: See Patient Education module for education specifics. ?Outcome: Progressing ?  ?Problem: RH BOWEL ELIMINATION ?Goal: RH STG MANAGE BOWEL WITH ASSISTANCE ?Description: STG Manage Bowel with mod I  Assistance. ?Outcome: Progressing ?Goal: RH STG MANAGE BOWEL W/MEDICATION W/ASSISTANCE ?Description: STG Manage Bowel with Medication with mod I Assistance. ?Outcome: Progressing ?  ?Problem: RH BLADDER ELIMINATION ?Goal: RH STG MANAGE BLADDER WITH ASSISTANCE ?Description: STG Manage Bladder With mod I Assistance ?Outcome: Progressing ?Goal: RH STG MANAGE BLADDER WITH MEDICATION WITH ASSISTANCE ?Description: STG Manage Bladder With Medication Withmod I  Assistance. ?Outcome: Progressing ?  ?Problem: RH SAFETY ?Goal: RH STG ADHERE TO SAFETY PRECAUTIONS W/ASSISTANCE/DEVICE ?Description: STG Adhere to Safety Precautions With cues Assistance/Device. ?Outcome: Progressing ?  ?Problem: RH PAIN MANAGEMENT ?Goal: RH STG PAIN MANAGED AT OR BELOW PT'S PAIN GOAL ?Description: At or < 4 w prn  ?Outcome: Progressing ?  ?Problem: RH KNOWLEDGE DEFICIT GENERAL ?Goal: RH STG INCREASE KNOWLEDGE OF SELF CARE AFTER HOSPITALIZATION ?Description: Patient and wife will be able to manage care at discharge using handouts and educational resources independently ?Outcome: Progressing ?  ?

## 2022-02-13 NOTE — Consult Note (Signed)
?Palliative Medicine Inpatient Consult Note ? ?Consulting Provider: Meredith Staggers, MD ? ?Reason for consult:   ?[facility] Palliative Medicine Consult  ?Reason for Consult? metastatic lung cancer, worsening neurological presentation. Review goals of care, available resources  ? 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 He was admitted on 02/03/22 for work up and MRI brain done revealing significant decrease in size of enhancing lesions (in pons, bilateral temporal lobes, left periventricular white matter and left frontal lobe). Decadron increased to 2 mg IV BID. He was placed on CIWA protocol due to hx of daily ETOH (down from 49 drinks to 3 drinks/day?). CT abdomen/pelvis done revealing new 5-10% left PTX, new acute/subacute right 9-10 rib FX, right foraminal impingement at L5/S1, stable appearance of stable left lung nodule and sclerotic metastatic disease involving thoracic spine and proximal femurs.  ?  ?Dr. Earlie Server consulted and recommended maintaining patient on 2 mg bid as he was feeling better. He continues to have RLE weakness with tendency to buckle, needs increased time to process and follow one step commands, has orthostatic changes with standing attempts as well as  right lateral and posterior lean. CIR recommended due to functional decline. ? ?Palliative care has been asked to get involved due to lack of progress at CIR.  ? ?Clinical Assessment/Goals of Care: ? ?*Please note that this is a verbal dictation therefore any spelling or grammatical errors are due to the "[place] [employer]" system interpretation. ? ?I have reviewed medical records including  EPIC notes, labs and imaging, received report from bedside RN, assessed the patient who is resting in bed eating "babe ruths".  ?  ?I met with Hawk and his spouse, Richard Santos to further discuss diagnosis prognosis, GOC, EOL wishes, disposition and options. ?  ?I introduced Palliative Medicine as specialized medical care for people living with serious illness. It focuses on providing relief from the symptoms and stress of a serious illness. The goal is to improve quality of life for both the patient and the family. ? ?Medical History Review and Understanding: ? ?Richard Santos and I reviewed that he was diagnoses with cancer in May 2021. He was doing well at home until recently. Reviewed that he underwent full brain radiation for the mets to his brain and chemo for his lung mass. Per patient and his wife these treatments worked well.  ? ?Discussed the concern in regards to thoracic metastasis. We reviewed that sometimes as helpful ad chemotherapy and radiation can be there can be a point where these treatments lack in efficacy due to disease burden.  ? ?Social History: ? ?Richard Santos shares that he lives in [place], [place]. He has been married to his wife, Richard Santos for the past 23 years, They share two daughters, one who lives in [place] and one who lives in [place], [place]. They share four grandchildren. Richard Santos loves the outdoors and use to enjoy golfing, gardening, racing. He shares he has three acres and had a "ride on" for maintenance. He is a man who has a personal faith.  ? ?Functional and Nutritional State: ? ?[name] was able to do most things until about three weeks ago when  his leg weakness worsened. Since then he is able to feed himself though needs help with other bADLs.  ? ?[name] has a good appetite and his wife brings him palatable items from home.   ? ?Palliative Symptoms: ? ?Rudransh only complains of the weakness which we reviewed is likely in direct correlation with this thoracic spine mets. Reviewed that  steroids have been increased.  ? ?Denies pain, nausea, dizziness, insomnia.  ? ?Advance Directives: ? ?A detailed discussion was had today regarding advanced directives. Richard Santos shares that she will bring in a copy though it does appear that they are scanned into Vyn[employer].  ? ?Code Status: ? ?For the time being, Richard Santos would wish for a trial of all measures to sustain life. He would not want prolonged efforts if not improvements were to be seen. He is presently Full Code status. ? ?Provided  "Hard Choices for Loving People" booklet.  ? ?Discussion: ? ?I reviewed with Richard Santos the concern with regards to the progressive nature of his cancer. He is very clear about wanting interventions continued to further aid in decreasing the progression of his disease. He knows that his disease may get to a point where no further treatments are of benefit to him.  ? ?For now goals are to continue treatments and ideally improve as much as able.  ? ?OP Palliative support has been offered on discharge in addition to seeing Athena Cousar in the OP Palliative clinical setting.  ? ?Discussed the importance of continued conversation with family and their  medical providers regarding overall plan of care and treatment options, ensuring decisions are within the context of the patients values and GOCs. ? ?Decision Maker: ?[name] (spouse) [phone] ? ?SUMMARY OF RECOMMENDATIONS   ?FULL CODE ? ?A discussion regarding patients metastatic disease was had. Patient would like to continue with offered treatments. He understands the current barriers he is facing.  ? ?Primary team has increased steroids per discussion with Oncology ? ?Appreciate additional insights by Dr. Mickeal Skinner and Dr. Julien Nordmann in terms of treatment options ? ?Plan for OP Palliative care consult ? ?[name] is also being seen at the complex symptoms management clinic with Lavone Nian which will be continued upon discharge ? ?Ongoing PMT support ? ?Code Status/Advance Care  Planning: ?FULL CODE ? ?Palliative Prophylaxis:  ?Aspiration, Bowel Regimen, Delirium Protocol, Frequent Pain Assessment, Oral Care, Palliative Wound Care, and Turn Reposition ? ?Additional Recommendations (Limitations, Scope, Preferences): ?Continue current care ? ?Psycho-social/Spiritual:  ?Desire for further Chaplaincy support: No ?Additional Recommendations: Education on metastatic disease ?  ?Prognosis: Worrisome in the setting of metastasis ? ?Discharge Planning: Discharge to home once optimized. ? ?Vitals:  ? 02/12/22 1509 02/12/22 1955  ?BP: 103/75 (!) 102/54  ?Pulse: (!) 108 93  ?Resp: 18 18  ?Temp: 97.6 ?F (36.4 ?C) 98.4 ?F (36.9 ?C)  ?SpO2: 100% 99%  ? ? ?Intake/Output Summary (Last 24 hours) at 02/13/2022 1359 ?Last data filed at 02/13/2022 1257 ?Gross per 24 hour  ?Intake 237 ml  ?Output 525 ml  ?Net -288 ml  ? ?Last Weight  Most recent update: 02/06/2022  8:23 PM  ? ? Weight  ?64.7 kg (142 lb 10.2 oz)  ?      ? ?  ? ?Gen:  Older caucasian M in NAD ?HEENT: moist mucous membranes ?CV: Irregular rate and rhythm ?PULM: On RA ?ABD: soft/nontender ?EXT: No edema ?Neuro: Alert and oriented x3 ? ?PPS: 40% ? ? ?This conversation/these recommendations were discussed with patient primary care team, Dr. Naaman Plummer ? ?  MDM [facility] ?______________________________________________________ ?Tacey Ruiz ?[facility] Team ?Team Cell Phone: [phone] ?Please utilize secure chat with additional questions, if there is no response within 30 minutes please call the above phone number ? ?Palliative Medicine Team providers are available by phone from 7am to 7pm daily and can be reached through the team cell phone.  ?Should this patient require assistance outside of these hours, please call the patient's attending physician. ? ? ?

## 2022-02-14 DIAGNOSIS — Z515 Encounter for palliative care: Secondary | ICD-10-CM | POA: Diagnosis not present

## 2022-02-14 MED ORDER — BISACODYL 5 MG PO TBEC
10.0000 mg | DELAYED_RELEASE_TABLET | Freq: Once | ORAL | Status: AC
  Administered 2022-02-14: 10 mg via ORAL
  Filled 2022-02-14: qty 2

## 2022-02-14 MED ORDER — DEXAMETHASONE 4 MG PO TABS
4.0000 mg | ORAL_TABLET | Freq: Three times a day (TID) | ORAL | Status: DC
  Administered 2022-02-14 – 2022-02-20 (×19): 4 mg via ORAL
  Filled 2022-02-14 (×19): qty 1

## 2022-02-14 NOTE — Progress Notes (Signed)
Port-a-cath deaccessed and flushed with heparin per policy on 0/94. Saline flush orders removed at this time due to pt port not currently accessed. Renda Rolls, RN notified. ?

## 2022-02-14 NOTE — Progress Notes (Signed)
Occupational Therapy Weekly Progress Note ? ?Patient Details  ?Name: Richard Santos ?MRN: [id number] ?Date of Birth: 1960-12-24 ? ?Beginning of progress report period: February 07, 2022 ?End of progress report period: February 14, 2022 ? ? ?Patient has met 0 of 4 short term goals.  [name] has made limited progress this reporting period with increased tone in BLE and trunk, no sensation in RLE, and decreased processing speed. POC has been updated to include BADLs at bed level, hoyer transfers, and rolling in prep for LB BADLs to decrease BOC with disease progression. Pt is able to manage Les in bed in prep for rolling with gait belts and has worked on circle sitting/balacne in prep for LB bed ADLs. ? ?Patient continues to demonstrate the following deficits: muscle weakness, decreased cardiorespiratoy endurance, abnormal tone, unbalanced muscle activation, and motor apraxia, decreased awareness, decreased problem solving, decreased safety awareness, decreased memory, and delayed processing, and decreased standing balance, decreased postural control, and decreased balance strategies and therefore will continue to benefit from skilled OT intervention to enhance overall performance with BADL and Reduce care partner burden. ? ?Patient not progressing toward long term goals.  See goal revision..  Plan of care revisions: MOD A BADL at bed level, rolling with MIN A and direction of hoyer transfers to decrease BOC. ? ?OT Short Term Goals ?Week 1:  OT Short Term Goal 1 (Week 1): Pt will complete toilet transfer with min A ?OT Short Term Goal 1 - Progress (Week 1): Not met ?OT Short Term Goal 2 (Week 1): Pt will complete toileting task with mod A ?OT Short Term Goal 2 - Progress (Week 1): Not met ?OT Short Term Goal 3 (Week 1): Pt will engage in dynamic standing balance task without locking knees for 1 min ?OT Short Term Goal 3 - Progress (Week 1): Not met ?OT Short Term Goal 4 (Week 1): Pt will complete LB dressing with mod A ?OT  Short Term Goal 4 - Progress (Week 1): Not met ?Week 2:  OT Short Term Goal 1 (Week 2): STG=LTG d/t ELOS as bed BADL level d/t disease progression/progress ? ? ?Lowella Dell Cassiopeia Florentino ?02/14/2022, 8:06 AM  ?

## 2022-02-14 NOTE — Progress Notes (Addendum)
?                                                       PROGRESS NOTE ? ? ?Subjective/Complaints: ?No real change in his legs today. They feel "dead" with little strength. He reports that his pain is controlled. I told the patient that I had discussed his case with Dr. Mickeal Skinner. Therapy about to work with him this morning when I came in.   ? ?ROS: Patient denies fever, rash, sore throat, blurred vision, dizziness, nausea, vomiting, diarrhea, cough, shortness of breath or chest pain, joint or back/neck pain, headache, or mood change.  ? ?Objective: ?  ?No results found. ?Recent Labs  ?  02/13/22 ?0601  ?WBC 7.9  ?HGB 10.4*  ?HCT 29.4*  ?PLT 175  ? ? ?Recent Labs  ?  02/13/22 ?0601  ?NA 132*  ?K 4.3  ?CL 99  ?CO2 24  ?GLUCOSE 103*  ?BUN 25*  ?CREATININE 0.67  ?CALCIUM 9.2  ? ? ? ?Intake/Output Summary (Last 24 hours) at 02/14/2022 1225 ?Last data filed at 02/14/2022 0813 ?Gross per 24 hour  ?Intake 354 ml  ?Output 1800 ml  ?Net -1446 ml  ?  ? ?  ? ?Physical Exam: ?Vital Signs ?Blood pressure 109/74, pulse 80, temperature 98.1 ?F (36.7 ?C), temperature source Oral, resp. rate 16, height 6' (1.829 m), weight 64.7 kg, SpO2 98 %. ? ? ?Constitutional: No distress . Vital signs reviewed. ?HEENT: NCAT, EOMI, oral membranes moist ?Neck: supple ?Cardiovascular: RRR without murmur. No JVD    ?Respiratory/Chest: CTA Bilaterally without wheezes or rales. Normal effort    ?GI/Abdomen: BS +, non-tender, non-distended ?Ext: no clubbing, cyanosis, or edema ?Psych: pleasant and cooperative  ?Musculoskeletal:  ?   Cervical back: Neck supple. No tenderness.  ? ?Skin: ?   General: Skin is warm and dry.  ?   Comments: Scab on right knee and healing abrasions left knee.  ?Port that's accessed in R chest;--stable appearing ?  ?Neurological:  ?   Mental Status: awakened fairly easily.   ?   Comments: delayed processing. Fair awareness. Mild right facial weakness. Occasional intentional tremor RUE.  Now has BLE sensory deficits for  LT/pain/proprioception, ~T8-10 sensory level. Unable to lift either leg off bed. UE 4/5. No sensory findings in UE's.  ? ?Assessment/Plan: ?1. Functional deficits which require 3+ hours per day of interdisciplinary therapy in a comprehensive inpatient rehab setting. ?Physiatrist is providing close team supervision and 24 hour management of active medical problems listed below. ?Physiatrist and rehab team continue to assess barriers to discharge/monitor patient progress toward functional and medical goals ? ?Care Tool: ? ?Bathing ?   ?Body parts bathed by patient: Right arm, Left arm, Chest, Front perineal area, Abdomen, Buttocks, Right upper leg, Left upper leg, Face  ? Body parts bathed by helper: Left lower leg, Right lower leg ?  ?  ?Bathing assist Assist Level: Moderate Assistance - Patient 50 - 74% ?  ?  ?Upper Body Dressing/Undressing ?Upper body dressing   ?What is the patient wearing?: Springer only ?   ?Upper body assist Assist Level: Minimal Assistance - Patient > 75% ?   ?Lower Body Dressing/Undressing ?Lower body dressing ? ? ?   ?What is the patient wearing?: Pants ? ?  ? ?Lower body assist Assist for lower body  dressing: Maximal Assistance - Patient 25 - 49% ?   ? ?Toileting ?Toileting Toileting Activity did not occur (Probation officer and hygiene only): N/A (no void or bm)  ?Toileting assist Assist for toileting: Total Assistance - Patient < 25% ?  ?  ?Transfers ?Chair/bed transfer ? ?Transfers assist ? Chair/bed transfer activity did not occur: Safety/medical concerns ? ?Chair/bed transfer assist level: 2 Helpers ?  ?  ?Locomotion ?Ambulation ? ? ?Ambulation assist ? ? Ambulation activity did not occur: Safety/medical concerns (requires use of AD for safe attempt at ambulation with skilled assistance) ? ?  ?  ?   ? ?Walk 10 feet activity ? ? ?Assist ? Walk 10 feet activity did not occur: Safety/medical concerns ? ?  ?   ? ?Walk 50 feet activity ? ? ?Assist Walk 50 feet with 2 turns activity  did not occur: Safety/medical concerns ? ?  ?   ? ? ?Walk 150 feet activity ? ? ?Assist Walk 150 feet activity did not occur: Safety/medical concerns ? ?  ?  ?  ? ?Walk 10 feet on uneven surface  ?activity ? ? ?Assist Walk 10 feet on uneven surfaces activity did not occur: Safety/medical concerns ? ? ?  ?   ? ?Wheelchair ? ? ? ? ?Assist Is the patient using a wheelchair?: Yes ?Type of Wheelchair: Manual ?  ? ?Wheelchair assist level: Dependent - Patient 0% ?   ? ? ?Wheelchair 50 feet with 2 turns activity ? ? ? ?Assist ? ?  ?  ? ? ?Assist Level: Dependent - Patient 0%  ? ?Wheelchair 150 feet activity  ? ? ? ?Assist ?   ? ? ?Assist Level: Dependent - Patient 0%  ? ?Blood pressure 109/74, pulse 80, temperature 98.1 ?F (36.7 ?C), temperature source Oral, resp. rate 16, height 6' (1.829 m), weight 64.7 kg, SpO2 98 %. ? ?Medical Problem List and Plan: ?1. Functional deficits secondary to debility from metastatic Cancer ?            -patient may not shower- due to R chest port ?            -ELOS/Goals: 02/20/22- min A to supervision ? -Continue CIR therapies including PT, OT, and SLP  Team is shifting focus of rehab to education/compensatory strategies/equipment. ?  -I reviewed case with Dr. Mickeal Skinner yesterday. Essentially it would be up to the patient if he wants to pursue further work up including spine MRI and lab work up (CSF). I asked the patient if he wanted to pursue and he was not sure. ?  -for now, i'm going to increase decadron to 4mg  q8   ?  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   ?-we'll need to make some decisions re: plan at the beginning of the coming week  ?-appreciate palliative care assistance ?-I spoke with Mrs. Ku yesterday ?2.  Antithrombotics: ?-DVT/anticoagulation:  Pharmaceutical: Lovenox ?            -antiplatelet therapy: Low dose ASA.  ?3. Pain Management: Continue Tylenol prn.  ?4. Mood: LCSW to follow for evaluation and support.  ?            -antipsychotic  agents: N/A ?5. Neuropsych: This patient may not be fully  capable of making decisions on his own behalf. SLUMs 11/30 ?6. Skin/Wound Care: routine pressure relief measures.  ?7. Fluids/Electrolytes/Nutrition:   ?-BUN elevated 3/24---push fluids. ?8.  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            --  continue decadron 4 mg BID ? -outpt f/u with Dr. Julien Nordmann to decide upon plan ? -see #1 ?9. Orthostatic hypotension: Continue to check orthostatic vitals.  ?            --TEDs and abdominal binder when out of bed. ?            --encourage fluid intake. ? 3/25- pushing fluids- BUN/Cr better -  ?10. Lung Cancer/COPD: Continue Dulera with incurse.  ?11. Suboptimal  Vitamin B12: Level-218-->received IV doses X 3-->po route daily.  ?12. Hypomagnesemia:supplemented ?13. Urinary retention: did not tolerate urecholine ? -foley replaced, pt feels better ? -UA neg, UCX neg also ?  ?14. Pancytopenia:  .  ?   -labs stable to improved 3/24 ?  ?  ? ?LOS: ?8 days ?A FACE TO FACE EVALUATION WAS PERFORMED ? ?Meredith Staggers ?02/14/2022, 12:25 PM  ? ? ? ? ?

## 2022-02-14 NOTE — Plan of Care (Signed)
?  Problem: Consults ?Goal: RH GENERAL PATIENT EDUCATION ?Description: See Patient Education module for education specifics. ?Outcome: Progressing ?  ?Problem: RH BOWEL ELIMINATION ?Goal: RH STG MANAGE BOWEL WITH ASSISTANCE ?Description: STG Manage Bowel with mod I  Assistance. ?Outcome: Progressing ?Goal: RH STG MANAGE BOWEL W/MEDICATION W/ASSISTANCE ?Description: STG Manage Bowel with Medication with mod I Assistance. ?Outcome: Progressing ?  ?Problem: RH BLADDER ELIMINATION ?Goal: RH STG MANAGE BLADDER WITH ASSISTANCE ?Description: STG Manage Bladder With mod I Assistance ?Outcome: Progressing ?Goal: RH STG MANAGE BLADDER WITH MEDICATION WITH ASSISTANCE ?Description: STG Manage Bladder With Medication Withmod I  Assistance. ?Outcome: Progressing ?  ?Problem: RH SAFETY ?Goal: RH STG ADHERE TO SAFETY PRECAUTIONS W/ASSISTANCE/DEVICE ?Description: STG Adhere to Safety Precautions With cues Assistance/Device. ?Outcome: Progressing ?  ?Problem: RH PAIN MANAGEMENT ?Goal: RH STG PAIN MANAGED AT OR BELOW PT'S PAIN GOAL ?Description: At or < 4 w prn  ?Outcome: Progressing ?  ?Problem: RH KNOWLEDGE DEFICIT GENERAL ?Goal: RH STG INCREASE KNOWLEDGE OF SELF CARE AFTER HOSPITALIZATION ?Description: Patient and wife will be able to manage care at discharge using handouts and educational resources independently ?Outcome: Progressing ?  ?

## 2022-02-14 NOTE — Progress Notes (Signed)
Occupational Therapy Session Note ? ?Patient Details  ?Name: Richard Santos ?MRN: [id number] ?Date of Birth: 12-08-1960 ? ?Today's Date: 02/14/2022 ?OT Individual Time: 7308-1683 ?OT Individual Time Calculation (min): 53 min  ? ? ?Short Term Goals: ?Week 1:  OT Short Term Goal 1 (Week 1): Pt will complete toilet transfer with min A ?OT Short Term Goal 2 (Week 1): Pt will complete toileting task with mod A ?OT Short Term Goal 3 (Week 1): Pt will engage in dynamic standing balance task without locking knees for 1 min ?OT Short Term Goal 4 (Week 1): Pt will complete LB dressing with mod A ? ?Skilled Therapeutic Interventions/Progress Updates:  ?  Pt received semi-reclined in bed, reports abdominal "swelling" but otherwise denies pain, agreeable to therapy. Session focus on self-care retraining, activity tolerance, BUE strengthening, transfer retraining in prep for improved ADL/IADL/func mobility performance + decreased caregiver burden. MD in/out morning rounds. Came to sitting EOB with max A to progress BLE off bed and to lift trunk. Consistent min to mod A to maintain static sitting balance EOB due to posterior lean. Total A SB transfer to TIS on his R, +2 present to stabilize board and for safety. Pt completed oral care seated at sink with set-up A, declined further need for ADL. ? ?Total A w/c transport to hospital atrium/outdoor area for change in scenery and to support psychosocial health.  ? ?Pt completed 1x10 of the following in prep for improved BUE strength to assist in compensatory techniques/manage BLE: forward/backward circles, overhead triceps dip, chest press, B shoulder flexion, w/c push-ups. Pt only with difficulty with w/c push-ups, unable to clear gluteal surface. Pt practiced self-propelling w/c outside over uneven surfaces, required overall min to mod A due to R trunk lean and L veering in w/c.  ? ?Pt left tilted back in TIS with LPN present with safety belt alarm engaged, call bell in reach, and all  immediate needs met.  ? ? ?Therapy Documentation ?Precautions:  ?Precautions ?Precautions: Fall ?Precaution Comments: truncal and BLE limb incoordination; absent R LE sensation with R knee buckle ?Restrictions ?Weight Bearing Restrictions: No ? ?Pain: ?  See session note ?ADL: See Care Tool for more details. ? ?Therapy/Group: Individual Therapy ? ?Volanda Napoleon MS, OTR/L ? ?02/14/2022, 6:55 AM ?

## 2022-02-14 NOTE — Progress Notes (Addendum)
? ?Palliative Medicine Inpatient Follow Up Note ? ? 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 He was admitted on 02/03/22 for work up and MRI brain done revealing significant decrease in size of enhancing lesions (in pons, bilateral temporal lobes, left periventricular white matter and left frontal lobe). Decadron increased to 2 mg IV BID. He was placed on CIWA protocol due to hx of daily ETOH (down from 49 drinks to 3 drinks/day?). CT abdomen/pelvis done revealing new 5-10% left PTX, new acute/subacute right 9-10 rib FX, right foraminal impingement at L5/S1, stable appearance of stable left lung nodule and sclerotic metastatic disease involving thoracic spine and proximal femurs.  ?  ?Dr. Earlie Server consulted and recommended maintaining patient on 2 mg bid as he was feeling better. He continues to have RLE weakness with tendency to buckle, needs increased time to process and follow one step commands, has orthostatic changes with standing attempts as well as  right lateral and posterior lean. CIR recommended due to functional decline. ?  ?Palliative care has been asked to get involved due to lack of progress at CIR.  ? ?Today's Discussion (02/14/2022): ? ?*Please note that this is a verbal dictation therefore any spelling or grammatical errors are due to the "[place] One" system interpretation. ? ?Chart reviewed inclusive of vital signs, progress notes, laboratory results, and diagnostic images.  ? ?I met with Richard Santos at bedside this morning. He shares that he is having some abdominal pain today. He notes that he has not had a BM in a number of days. He denies N/V. We reviewed setting up a proper bowel regiment  which he is in favor of presently.  ? ?Richard Santos expresses a degree of distress over the loss of strength and feeling in his BLE. We reviewed the plan for Dr. Naaman Plummer to further coordinate care with Dr. Mickeal Skinner. Richard Santos does not feel that he is improving which is frustrating. Created space and opportunity for patient to explore thoughts feelings and fears regarding current medical situation.  ? ?Questions and concerns addressed  ? ?Palliative Support Provided ? ?Objective Assessment: ?Vital Signs ?Vitals:  ? 02/14/22 0526 02/14/22 0800  ?BP: 109/74   ?Pulse: 80   ?Resp: 16   ?Temp: 98.1 ?F (36.7 ?C)   ?SpO2: 98% 98%  ? ? ?Intake/Output Summary (Last 24 hours) at 02/14/2022 7371 ?Last data filed at 02/14/2022 0813 ?Gross per 24 hour  ?Intake 354 ml  ?Output 1800 ml  ?Net -1446 ml  ? ?Last Weight  Most recent update: 02/06/2022  8:23 PM  ? ? Weight  ?64.7 kg (142 lb 10.2 oz)  ?      ? ?  ? ?Gen:  Older caucasian M in NAD ?HEENT: moist mucous membranes ?CV: Irregular rate and rhythm ?PULM: On RA ?ABD: soft/nontender ?EXT: No edema ?Neuro: Alert and oriented x3 ? ?SUMMARY OF RECOMMENDATIONS   ?FULL CODE ?  ?A discussion regarding patients metastatic disease was had. Patient would like to continue with offered treatments. He understands the current barriers he is facing.  ?  ?Primary team has increased steroids per discussion with Oncology ?  ?Appreciate additional insights by Dr. Mickeal Skinner and Dr. Julien Nordmann in terms of treatment  options --> patient interested to better understand what the plan is from here ?  ?Plan for OP Palliative care consult ?  ?Richard Santos is also being seen at the complex symptoms management clinic with Lavone Nian which will be continued upon discharge ?  ?Ongoing PMT support ? ?Symptoms: ?Constipation: ?- Added bisacodyl 63m Po x1 this AM ?- Continue Senna 1 tab PO QHS ?- Has Bisacodyl supp, Miralax, and Fleet Enema ordered as PRNs ? ?MDM -  Moderate ?_____________________________________________________________________________________ ?MTacey Ruiz?[place] ?Team Cell Phone: [phone]?Please utilize secure chat with additional questions, if there is no response within 30 minutes please call the above phone number ? ?Palliative Medicine Team providers are available by phone from 7am to 7pm daily and can be reached through the team cell phone.  ?Should this patient require assistance outside of these hours, please call the patient's attending physician. ? ? ? ? ?

## 2022-02-14 NOTE — Progress Notes (Signed)
Called IV team concerning patient right chest port orders to flush. Unable to reach any one. IV team consult order placed to assess port and need for flush. ?Sanda Linger, LPN ? ?

## 2022-02-15 ENCOUNTER — Inpatient Hospital Stay (HOSPITAL_COMMUNITY): Payer: 59

## 2022-02-15 DIAGNOSIS — Z515 Encounter for palliative care: Secondary | ICD-10-CM | POA: Diagnosis not present

## 2022-02-15 LAB — GLUCOSE, CAPILLARY: Glucose-Capillary: 108 mg/dL — ABNORMAL HIGH (ref 70–99)

## 2022-02-15 MED ORDER — SODIUM CHLORIDE 0.9% FLUSH
10.0000 mL | INTRAVENOUS | Status: DC | PRN
  Administered 2022-02-18: 10 mL

## 2022-02-15 MED ORDER — GADOBUTROL 1 MMOL/ML IV SOLN
6.0000 mL | Freq: Once | INTRAVENOUS | Status: AC | PRN
  Administered 2022-02-15: 6 mL via INTRAVENOUS

## 2022-02-15 NOTE — Progress Notes (Signed)
? ?Palliative Medicine Inpatient Follow Up Note ? ? 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 He was admitted on 02/03/22 for work up and MRI brain done revealing significant decrease in size of enhancing lesions (in pons, bilateral temporal lobes, left periventricular white matter and left frontal lobe). Decadron increased to 2 mg IV BID. He was placed on CIWA protocol due to hx of daily ETOH (down from 49 drinks to 3 drinks/day?). CT abdomen/pelvis done revealing new 5-10% left PTX, new acute/subacute right 9-10 rib FX, right foraminal impingement at L5/S1, stable appearance of stable left lung nodule and sclerotic metastatic disease involving thoracic spine and proximal femurs.  ?  ?Dr. Earlie Server consulted and recommended maintaining patient on 2 mg bid as he was feeling better. He continues to have RLE weakness with tendency to buckle, needs increased time to process and follow one step commands, has orthostatic changes with standing attempts as well as  right lateral and posterior lean. CIR recommended due to functional decline. ?  ?Palliative care has been asked to get involved due to lack of progress at CIR.  ? ?Today's Discussion (02/15/2022): ? ?*Please note that this is a verbal dictation therefore any spelling or grammatical errors are due to the "[place] One" system interpretation. ? ?Chart reviewed inclusive of vital signs, progress notes, laboratory results, and diagnostic images.  ? ?I met with [name] at bedside this morning. He expresses that "he doesn't know" regarding why he cannot progress with rehab. We reviewed his metastatic disease to his thoracic spine. I asked if the steroids were  helping which he shares that they are not to his knowledge.  ? ?A conversation was held in terms of patients declined health state I the setting of metastatic disease to the spine. I shared, again, often mets to the bone is not a good clinical sign. We again reviewed the poor outcomes of patients who undergo resuscitation with an underlying disease which is not curable.   ? ?We reviewed the concern that [name]'s time on earth may be short. [name] reasonable would like to better understand this from his oncology team. I shared that I would reach out to them this morning to gain greater clarity for him. ? ?Otherwise, [name] continues to share that he has had poor mobility of his bowels. We reviewed a plan for him to have a BM. ? ?Questions and concerns addressed  ? ?Palliative Support Provided ? ?Objective Assessment: ?Vital Signs ?Vitals:  ? 02/15/22 0532 02/15/22 6213  ?BP: 110/75   ?Pulse: 75   ?Resp: 14   ?Temp: 98.2 ?F (36.8 ?C)   ?SpO2: 100% 96%  ? ? ?Intake/Output Summary (Last 24 hours) at 02/15/2022 1210 ?Last data filed at 02/14/2022 1758 ?Gross per 24 hour  ?Intake 240 ml  ?Output 375 ml  ?Net -135 ml  ? ? ?Last Weight  Most recent update: 02/06/2022  8:23 PM  ? ? Weight  ?64.7 kg (142 lb 10.2 oz)  ?      ? ?  ? ?Gen:  Older caucasian M in NAD ?HEENT: moist mucous membranes ?CV: Irregular rate and rhythm ?PULM: On RA ?ABD: soft/nontender ?EXT: No edema ?Neuro: Alert and oriented  x3 ? ?SUMMARY OF RECOMMENDATIONS   ?FULL CODE --> This is an ongoing conversation as I worry patient outcomes would be very poor in the setting of his underlying metastatic disease ?  ?Appreciate additional insights by Dr. Mickeal Skinner and Dr. Julien Nordmann in terms of treatment options --> patient interested to better understand what the plan is from here ?  ?Plan for OP Palliative care consult ?  ?Lonzo is also being seen at the complex symptoms management clinic with Lavone Nian which will be continued upon discharge ?  ?Ongoing PMT  support ? ?Symptoms: ?Constipation: ?- Added bisacodyl 79m Po x1 this AM ?- Continue Senna 1 tab PO QHS ?- Has Bisacodyl supp, Miralax, and Fleet Enema ordered as PRNs ? ?MDM - Moderate ?_____________________________________________________________________________________ ?MTacey Ruiz?[place] ?Team Cell Phone: [phone]?Please utilize secure chat with additional questions, if there is no response within 30 minutes please call the above phone number ? ?Palliative Medicine Team providers are available by phone from 7am to 7pm daily and can be reached through the team cell phone.  ?Should this patient require assistance outside of these hours, please call the patient's attending physician. ? ? ? ? ?

## 2022-02-15 NOTE — Progress Notes (Signed)
?                                                       PROGRESS NOTE ? ? ?Subjective/Complaints: ?No change in neurological status.  Denies any pain. Was able to sleep last night. Asked me "what his chances were" when we discussed his metastatic cancer ? ?ROS: Patient denies fever, rash, sore throat, blurred vision, dizziness, nausea, vomiting, diarrhea, cough, shortness of breath or chest pain, joint or back/neck pain, headache, or mood change.  ? ?Objective: ?  ?No results found. ?Recent Labs  ?  02/13/22 ?0601  ?WBC 7.9  ?HGB 10.4*  ?HCT 29.4*  ?PLT 175  ? ? ?Recent Labs  ?  02/13/22 ?0601  ?NA 132*  ?K 4.3  ?CL 99  ?CO2 24  ?GLUCOSE 103*  ?BUN 25*  ?CREATININE 0.67  ?CALCIUM 9.2  ? ? ? ?Intake/Output Summary (Last 24 hours) at 02/15/2022 0949 ?Last data filed at 02/14/2022 1758 ?Gross per 24 hour  ?Intake 240 ml  ?Output 375 ml  ?Net -135 ml  ?  ? ?  ? ?Physical Exam: ?Vital Signs ?Blood pressure 110/75, pulse 75, temperature 98.2 ?F (36.8 ?C), resp. rate 14, height 6' (1.829 m), weight 64.7 kg, SpO2 96 %. ? ? ?Constitutional: No distress . Vital signs reviewed. ?HEENT: NCAT, EOMI, oral membranes moist ?Neck: supple ?Cardiovascular: RRR without murmur. No JVD    ?Respiratory/Chest: CTA Bilaterally without wheezes or rales. Normal effort    ?GI/Abdomen: BS +, non-tender, non-distended ?Ext: no clubbing, cyanosis, or edema ?Psych: pleasant and cooperative  ?Musculoskeletal:  ?   Cervical back: Neck supple. No back tenderness.  ? ?Skin: ?   General: Skin is warm and dry.  ?   Comments: Scab on right knee and healing abrasions left knee.  ?Port that's accessed in R chest;--stable appearing ?  ?Neurological:  ?   Mental Status:alert, delayed processing. Fair awareness. Mild right facial weakness. Occasional intentional tremor RUE.  Now has BLE sensory deficits for LT/pain/proprioception, ~T8-10 sensory level. Unable to move either leg. UE 4/5. No sensory findings in UE's.  ? ?Assessment/Plan: ?1. Functional deficits  which require 3+ hours per day of interdisciplinary therapy in a comprehensive inpatient rehab setting. ?Physiatrist is providing close team supervision and 24 hour management of active medical problems listed below. ?Physiatrist and rehab team continue to assess barriers to discharge/monitor patient progress toward functional and medical goals ? ?Care Tool: ? ?Bathing ?   ?Body parts bathed by patient: Right arm, Left arm, Chest, Front perineal area, Abdomen, Buttocks, Right upper leg, Left upper leg, Face  ? Body parts bathed by helper: Left lower leg, Right lower leg ?  ?  ?Bathing assist Assist Level: Moderate Assistance - Patient 50 - 74% ?  ?  ?Upper Body Dressing/Undressing ?Upper body dressing   ?What is the patient wearing?: [place] only ?   ?Upper body assist Assist Level: Minimal Assistance - Patient > 75% ?   ?Lower Body Dressing/Undressing ?Lower body dressing ? ? ?   ?What is the patient wearing?: Pants ? ?  ? ?Lower body assist Assist for lower body dressing: Maximal Assistance - Patient 25 - 49% ?   ? ?Toileting ?Toileting Toileting Activity did not occur (Probation officer and hygiene only): N/A (no void or bm)  ?Toileting assist Assist  for toileting: Total Assistance - Patient < 25% ?  ?  ?Transfers ?Chair/bed transfer ? ?Transfers assist ? Chair/bed transfer activity did not occur: Safety/medical concerns ? ?Chair/bed transfer assist level: 2 Helpers ?  ?  ?Locomotion ?Ambulation ? ? ?Ambulation assist ? ? Ambulation activity did not occur: Safety/medical concerns (requires use of AD for safe attempt at ambulation with skilled assistance) ? ?  ?  ?   ? ?Walk 10 feet activity ? ? ?Assist ? Walk 10 feet activity did not occur: Safety/medical concerns ? ?  ?   ? ?Walk 50 feet activity ? ? ?Assist Walk 50 feet with 2 turns activity did not occur: Safety/medical concerns ? ?  ?   ? ? ?Walk 150 feet activity ? ? ?Assist Walk 150 feet activity did not occur: Safety/medical concerns ? ?  ?  ?   ? ?Walk 10 feet on uneven surface  ?activity ? ? ?Assist Walk 10 feet on uneven surfaces activity did not occur: Safety/medical concerns ? ? ?  ?   ? ?Wheelchair ? ? ? ? ?Assist Is the patient using a wheelchair?: Yes ?Type of Wheelchair: Manual ?  ? ?Wheelchair assist level: Dependent - Patient 0% ?   ? ? ?Wheelchair 50 feet with 2 turns activity ? ? ? ?Assist ? ?  ?  ? ? ?Assist Level: Dependent - Patient 0%  ? ?Wheelchair 150 feet activity  ? ? ? ?Assist ?   ? ? ?Assist Level: Dependent - Patient 0%  ? ?Blood pressure 110/75, pulse 75, temperature 98.2 ?F (36.8 ?C), resp. rate 14, height 6' (1.829 m), weight 64.7 kg, SpO2 96 %. ? ?Medical Problem List and Plan: ?1. Functional deficits secondary to debility from metastatic Cancer ?            -patient may not shower- due to R chest port ?            -ELOS/Goals: 02/20/22- min A to supervision ? -Continue CIR therapies including PT, OT, and SLP  Team is shifting focus of rehab to education/compensatory strategies/equipment. ?  -Ive increased decadron to 4mg  q8   ?  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   ?-I discussed with patient today and he said the findings on an MRI would help him make decisions regarding his future care ?-I've ordered T and L-spine MRI with contrast ?-appreciate palliative care assistance ?-Dr. Mickeal Skinner to see Monday ?2.  Antithrombotics: ?-DVT/anticoagulation:  Pharmaceutical: Lovenox ?            -antiplatelet therapy: Low dose ASA.  ?3. Pain Management: Continue Tylenol prn.  ?4. Mood: LCSW to follow for evaluation and support.  ?            -antipsychotic agents: N/A ?5. Neuropsych: This patient may not be fully  capable of making decisions on his own behalf. SLUMs 11/30 ?6. Skin/Wound Care: routine pressure relief measures.  ?7. Fluids/Electrolytes/Nutrition:   ?-BUN elevated 3/24---continue to push fluids ?-recheck labs this week. ?8.  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             --continue decadron 4 mg BID ? -outpt f/u with Dr. Julien Nordmann to decide upon plan ? -see #1 ?9. Orthostatic hypotension: Continue to check orthostatic vitals.  ?            --TEDs and abdominal binder when out of bed. ?            --encourage fluid  intake. ? 3/25- pushing fluids- recheck labs tomorrow  ?10. Lung Cancer/COPD: Continue Dulera with incurse.  ?11. Suboptimal  Vitamin B12: Level-218-->received IV doses X 3-->po route daily.  ?12. Hypomagnesemia:supplemented ?13. Urinary retention: did not tolerate urecholine ? -foley replaced, pt feels better ? -UA neg, UCX neg also ?  ?14. Pancytopenia:  .  ?   -labs stable to improved 3/24 ?  ?  ? ?LOS: ?9 days ?A FACE TO FACE EVALUATION WAS PERFORMED ? ?Meredith Staggers ?02/15/2022, 9:49 AM  ? ? ? ? ?

## 2022-02-15 NOTE — Plan of Care (Signed)
?  Problem: Consults ?Goal: RH GENERAL PATIENT EDUCATION ?Description: See Patient Education module for education specifics. ?Outcome: Progressing ?  ?Problem: RH BOWEL ELIMINATION ?Goal: RH STG MANAGE BOWEL WITH ASSISTANCE ?Description: STG Manage Bowel with mod I  Assistance. ?Outcome: Progressing ?Goal: RH STG MANAGE BOWEL W/MEDICATION W/ASSISTANCE ?Description: STG Manage Bowel with Medication with mod I Assistance. ?Outcome: Progressing ?  ?Problem: RH BLADDER ELIMINATION ?Goal: RH STG MANAGE BLADDER WITH ASSISTANCE ?Description: STG Manage Bladder With mod I Assistance ?Outcome: Progressing ?Goal: RH STG MANAGE BLADDER WITH MEDICATION WITH ASSISTANCE ?Description: STG Manage Bladder With Medication Withmod I  Assistance. ?Outcome: Progressing ?  ?Problem: RH SAFETY ?Goal: RH STG ADHERE TO SAFETY PRECAUTIONS W/ASSISTANCE/DEVICE ?Description: STG Adhere to Safety Precautions With cues Assistance/Device. ?Outcome: Progressing ?  ?Problem: RH PAIN MANAGEMENT ?Goal: RH STG PAIN MANAGED AT OR BELOW PT'S PAIN GOAL ?Description: At or < 4 w prn  ?Outcome: Progressing ?  ?Problem: RH KNOWLEDGE DEFICIT GENERAL ?Goal: RH STG INCREASE KNOWLEDGE OF SELF CARE AFTER HOSPITALIZATION ?Description: Patient and wife will be able to manage care at discharge using handouts and educational resources independently ?Outcome: Progressing ?  ?

## 2022-02-15 NOTE — Progress Notes (Addendum)
Physical Therapy Weekly Progress Note ? ?Patient Details  ?Name: Richard Santos ?MRN: [id number] ?Date of Birth: 1961-06-16 ? ?Beginning of progress report period: February 07, 2022 ?End of progress report period: February 15, 2022 ? ?Today's Date: 02/15/2022 ?PT Individual Time: 1010-1105 ?PT Individual Time Calculation (min): 55 min  ? ?Patient has met 0 of 5 short term goals.  Patient with significant functional decline this week. Noted new onset of L weakness progressing to paraplegia with sensory loss throughout the week. Adjusted POC for family education/training for use of [place] lift and hospital bed due to patient's functional decline and prognosis. Will focus on family education this week.  ? ?Patient continues to demonstrate the following deficits decreased cardiorespiratoy endurance, abnormal tone, decreased problem solving and decreased memory, and decreased sitting balance, decreased standing balance, decreased postural control, and decreased balance strategies and therefore will continue to benefit from skilled PT intervention to increase functional independence with mobility. ? ?Patient not progressing toward long term goals.  See goal revision..  Plan of care revisions: Goals now set for family to be independent with [facility] lift for transfers and hospital bed for bed mobility with LE PROM HEP . ? ?PT Short Term Goals ?Week 1:  PT Short Term Goal 1 (Week 1): Pt will perform supine<>sit with CGA ?PT Short Term Goal 1 - Progress (Week 1): Not progressing ?PT Short Term Goal 2 (Week 1): Pt will perform sit<>stands using LRAD with min assist ?PT Short Term Goal 2 - Progress (Week 1): Not progressing ?PT Short Term Goal 3 (Week 1): Pt will perform bed<>chair transfers using LRAD with min assist ?PT Short Term Goal 3 - Progress (Week 1): Not progressing ?PT Short Term Goal 4 (Week 1): Pt will ambulate at least 42f using LRAD with mod assist ?PT Short Term Goal 4 - Progress (Week 1): Not progressing ?PT Short Term  Goal 5 (Week 1): Pt will initiate stair training ?PT Short Term Goal 5 - Progress (Week 1): Not progressing ?Week 2:  PT Short Term Goal 1 (Week 2): STG=LTG due to ELOS. ? ?Skilled Therapeutic Interventions/Progress Updates:  ?   ?Patient in bed upon PT arrival. Patient alert and agreeable to PT session. Patient denied pain during session. ? ?Spent increased time discussing patient's decline in function. Patient unable to move his legs today with lack of sensation bilaterally. Provided therapeutic listening, comfort, and education during discussion as patient present with signs of grief regarding his prognosis and loss of function. Patient in agreement to changes in POC to assist patient [place]. Patient reports his wife will be here this week for hands on training.  ? ?Focused session on discussion above and introduction to lift transfers and improved sitting tolerance. Retrieved TIS w/c with Roho cushion for improved sitting tolerance and dependent pressure relief. Patient performed rolling R/L with min-mod A with use of bed rails to don pants dependently and place lift sling. Patient was transferred dependently bed>TIS w/c using the Maxi-move with +2 assist for safety. Adjusted TIS w/c and provided pillows on each side of patient for improved sitting tolerance and pressure relief. Patient reported increased comfort in TIS w/c and agreeable to attempt to stay up for 1 hour SLP session and lunch following session. NT aware patient OOB and will need to use Maxi-move to transfer patient back to bed. Sling removed from under patient to maintain skin integrity. ? ?Patient in TIS at end of session with  breaks locked, seat belt alarm set, and all needs within reach.  ? ?Therapy Documentation ?Precautions:  ?Precautions ?Precautions: Fall ?Precaution Comments: truncal and BLE limb incoordination; absent R LE sensation with R knee buckle ?Restrictions ?Weight Bearing  Restrictions: No ? ? ?Therapy/Group: Individual Therapy ? ?Doreene Burke PT, DPT ? ?02/15/2022, 3:53 PM  ?

## 2022-02-15 NOTE — Plan of Care (Signed)
?Problem: RH Balance ?Goal: LTG Patient will maintain dynamic sitting balance (PT) ?Description: LTG:  Patient will maintain dynamic sitting balance with assistance during mobility activities (PT) ?02/15/2022 1611 by Apolinar Junes L, PT ?Outcome: Not Progressing ?Flowsheets (Taken 02/15/2022 1611) ?LTG: Pt will maintain dynamic sitting balance during mobility activities with:: (d/c gaol due to functional decline since admission) -- ?Note: d/c gaol due to functional decline since admission ?02/15/2022 1603 by Doreene Burke, PT ?Outcome: Not Progressing ?Flowsheets (Taken 02/15/2022 1603) ?LTG: Pt will maintain dynamic sitting balance during mobility activities with:: (d/c gaol due to functional decline since admission) -- ?Note: d/c gaol due to functional decline since admission ?Goal: LTG Patient will maintain dynamic standing balance (PT) ?Description: LTG:  Patient will maintain dynamic standing balance with assistance during mobility activities (PT) ?02/15/2022 1611 by Apolinar Junes L, PT ?Outcome: Not Progressing ?Flowsheets (Taken 02/15/2022 1611) ?LTG: Pt will maintain dynamic standing balance during mobility activities with:: (d/c gaol due to functional decline since admission) -- ?Note: d/c gaol due to functional decline since admission ?02/15/2022 1603 by Doreene Burke, PT ?Outcome: Not Progressing ?Flowsheets (Taken 02/15/2022 1603) ?LTG: Pt will maintain dynamic standing balance during mobility activities with:: (d/c gaol due to functional decline since admission) -- ?Note: d/c gaol due to functional decline since admission ?  ?Problem: Sit to Stand ?Goal: LTG:  Patient will perform sit to stand with assistance level (PT) ?Description: LTG:  Patient will perform sit to stand with assistance level (PT) ?02/15/2022 1611 by Apolinar Junes L, PT ?Outcome: Not Progressing ?Flowsheets (Taken 02/15/2022 1611) ?LTG: PT will perform sit to stand in preparation for functional mobility with  assistance level: (d/c gaol due to functional decline since admission) -- ?Note: d/c gaol due to functional decline since admission ?02/15/2022 1603 by Doreene Burke, PT ?Flowsheets (Taken 02/15/2022 1603) ?LTG: PT will perform sit to stand in preparation for functional mobility with assistance level: (d/c gaol due to functional decline since admission) -- ?Note: d/c gaol due to functional decline since admission ?  ?Problem: RH Bed Mobility ?Goal: LTG Patient will perform bed mobility with assist (PT) ?Description: LTG: Patient will perform bed mobility with assistance, with/without cues (PT). ?02/15/2022 1611 by Justice Aguirre L, PT ?Outcome: Not Progressing ?Flowsheets (Taken 02/15/2022 1611) ?LTG: Pt will perform bed mobility with assistance level of: (d/c gaol due to functional decline since admission) -- ?Note: d/c gaol due to functional decline since admission ?02/15/2022 1603 by Doreene Burke, PT ?Outcome: Not Progressing ?Flowsheets (Taken 02/15/2022 1603) ?LTG: Pt will perform bed mobility with assistance level of: (d/c gaol due to functional decline since admission) -- ?Note: d/c gaol due to functional decline since admission ?  ?Problem: RH Bed to Chair Transfers ?Goal: LTG Patient will perform bed/chair transfers w/assist (PT) ?Description: LTG: Patient will perform bed to chair transfers with assistance (PT). ?02/15/2022 1611 by Timmie Calix L, PT ?Outcome: Not Progressing ?Flowsheets (Taken 02/15/2022 1611) ?LTG: Pt will perform Bed to Chair Transfers with assistance level: (d/c gaol due to functional decline since admission) -- ?Note: d/c gaol due to functional decline since admission ?02/15/2022 1603 by Doreene Burke, PT ?Flowsheets (Taken 02/15/2022 1603) ?LTG: Pt will perform Bed to Chair Transfers with assistance level: (d/c gaol due to functional decline since admission) -- ?Note: d/c gaol due to functional decline since admission ?  ?Problem: RH Car Transfers ?Goal: LTG  Patient will perform car transfers with assist (PT) ?Description: LTG: Patient will perform car transfers with assistance (PT). ?02/15/2022 1611  by Doreene Burke, PT ?Outcome: Not Progressing ?Flowsheets (Taken 02/15/2022 1611) ?LTG: Pt will perform car transfers with assist:: (d/c gaol due to functional decline since admission) -- ?Note: d/c gaol due to functional decline since admission ?02/15/2022 1603 by Doreene Burke, PT ?Outcome: Not Progressing ?Flowsheets (Taken 02/15/2022 1603) ?LTG: Pt will perform car transfers with assist:: (d/c gaol due to functional decline since admission) -- ?Note: d/c gaol due to functional decline since admission ?  ?Problem: RH Ambulation ?Goal: LTG Patient will ambulate in controlled environment (PT) ?Description: LTG: Patient will ambulate in a controlled environment, # of feet with assistance (PT). ?02/15/2022 1611 by Nekeshia Lenhardt L, PT ?Outcome: Not Progressing ?Flowsheets (Taken 02/15/2022 1611) ?LTG: Pt will ambulate in controlled environ  assist needed:: (d/c gaol due to functional decline since admission) -- ?Note: d/c gaol due to functional decline since admission ?02/15/2022 1603 by Doreene Burke, PT ?Outcome: Not Progressing ?Flowsheets (Taken 02/15/2022 1603) ?LTG: Pt will ambulate in controlled environ  assist needed:: (d/c gaol due to functional decline since admission) -- ?Note: d/c gaol due to functional decline since admission ?Goal: LTG Patient will ambulate in home environment (PT) ?Description: LTG: Patient will ambulate in home environment, # of feet with assistance (PT). ?02/15/2022 1611 by Firman Petrow L, PT ?Outcome: Not Progressing ?Flowsheets (Taken 02/15/2022 1611) ?LTG: Pt will ambulate in home environ  assist needed:: (d/c gaol due to functional decline since admission) -- ?Note: d/c gaol due to functional decline since admission ?02/15/2022 1603 by Doreene Burke, PT ?Outcome: Not Progressing ?Flowsheets (Taken 02/15/2022  1603) ?LTG: Pt will ambulate in home environ  assist needed:: (d/c gaol due to functional decline since admission) -- ?Note: d/c gaol due to functional decline since admission ?  ?Problem: RH Wheelchair Mobility ?Goal: LTG Patient will propel w/c in controlled environment (PT) ?Description: LTG: Patient will propel wheelchair in controlled environment, # of feet with assist (PT) ?02/15/2022 1611 by Apolinar Junes L, PT ?Outcome: Not Progressing ?Flowsheets (Taken 02/15/2022 1611) ?LTG: Pt will propel w/c in controlled environ  assist needed:: (d/c gaol due to functional decline since admission) -- ?Note: d/c gaol due to functional decline since admission ?02/15/2022 1603 by Doreene Burke, PT ?Flowsheets (Taken 02/15/2022 1603) ?LTG: Pt will propel w/c in controlled environ  assist needed:: (d/c gaol due to functional decline since admission) -- ?Note: d/c gaol due to functional decline since admission ?Goal: LTG Patient will propel w/c in home environment (PT) ?Description: LTG: Patient will propel wheelchair in home environment, # of feet with assistance (PT). ?02/15/2022 1611 by Kartier Bennison L, PT ?Outcome: Not Progressing ?Flowsheets (Taken 02/15/2022 1611) ?LTG: Pt will propel w/c in home environ  assist needed:: (d/c gaol due to functional decline since admission) -- ?Note: d/c gaol due to functional decline since admission ?02/15/2022 1603 by Doreene Burke, PT ?Outcome: Not Progressing ?Flowsheets (Taken 02/15/2022 1603) ?LTG: Pt will propel w/c in home environ  assist needed:: (d/c gaol due to functional decline since admission) -- ?Note: d/c gaol due to functional decline since admission ?  ?Problem: RH Stairs ?Goal: LTG Patient will ambulate up and down stairs w/assist (PT) ?Description: LTG: Patient will ambulate up and down # of stairs with assistance (PT) ?02/15/2022 1611 by Apolinar Junes L, PT ?Outcome: Not Progressing ?Flowsheets (Taken 02/15/2022 1611) ?LTG: Pt will ambulate up/down  stairs assist needed:: (d/c gaol due to functional decline since admission) -- ?Note: d/c gaol due to functional decline since admission ?02/15/2022 1603 by Apolinar Junes  L, PT ?Flowsheets (Taken 02/15/2022

## 2022-02-16 ENCOUNTER — Inpatient Hospital Stay (HOSPITAL_COMMUNITY): Payer: 59

## 2022-02-16 ENCOUNTER — Telehealth: Payer: Self-pay | Admitting: Medical Oncology

## 2022-02-16 DIAGNOSIS — C3412 Malignant neoplasm of upper lobe, left bronchus or lung: Secondary | ICD-10-CM

## 2022-02-16 DIAGNOSIS — C7951 Secondary malignant neoplasm of bone: Secondary | ICD-10-CM | POA: Diagnosis not present

## 2022-02-16 DIAGNOSIS — R5381 Other malaise: Secondary | ICD-10-CM | POA: Diagnosis not present

## 2022-02-16 DIAGNOSIS — Z7189 Other specified counseling: Secondary | ICD-10-CM | POA: Diagnosis not present

## 2022-02-16 DIAGNOSIS — C801 Malignant (primary) neoplasm, unspecified: Secondary | ICD-10-CM

## 2022-02-16 DIAGNOSIS — R531 Weakness: Secondary | ICD-10-CM

## 2022-02-16 DIAGNOSIS — Z515 Encounter for palliative care: Secondary | ICD-10-CM | POA: Diagnosis not present

## 2022-02-16 DIAGNOSIS — C7931 Secondary malignant neoplasm of brain: Secondary | ICD-10-CM | POA: Diagnosis not present

## 2022-02-16 LAB — BASIC METABOLIC PANEL
Anion gap: 8 (ref 5–15)
BUN: 41 mg/dL — ABNORMAL HIGH (ref 6–20)
CO2: 27 mmol/L (ref 22–32)
Calcium: 9.4 mg/dL (ref 8.9–10.3)
Chloride: 102 mmol/L (ref 98–111)
Creatinine, Ser: 0.77 mg/dL (ref 0.61–1.24)
GFR, Estimated: >60 mL/min (ref >60)
Glucose, Bld: 112 mg/dL — ABNORMAL HIGH (ref 70–99)
Potassium: 4.1 mmol/L (ref 3.5–5.1)
Sodium: 137 mmol/L (ref 135–145)

## 2022-02-16 LAB — CBC
HCT: 30.4 % — ABNORMAL LOW (ref 39.0–52.0)
Hemoglobin: 10.3 g/dL — ABNORMAL LOW (ref 13.0–17.0)
MCH: 35.5 pg — ABNORMAL HIGH (ref 26.0–34.0)
MCHC: 33.9 g/dL (ref 30.0–36.0)
MCV: 104.8 fL — ABNORMAL HIGH (ref 80.0–100.0)
Platelets: 231 10*3/uL (ref 150–400)
RBC: 2.9 MIL/uL — ABNORMAL LOW (ref 4.22–5.81)
RDW: 13.2 % (ref 11.5–15.5)
WBC: 8.4 10*3/uL (ref 4.0–10.5)
nRBC: 0 % (ref 0.0–0.2)

## 2022-02-16 MED ORDER — SENNOSIDES-DOCUSATE SODIUM 8.6-50 MG PO TABS
2.0000 | ORAL_TABLET | Freq: Every day | ORAL | Status: DC
  Administered 2022-02-17 – 2022-02-19 (×3): 2 via ORAL
  Filled 2022-02-16 (×3): qty 2

## 2022-02-16 MED ORDER — BISACODYL 10 MG RE SUPP
10.0000 mg | Freq: Every day | RECTAL | Status: DC
  Administered 2022-02-17 – 2022-02-19 (×3): 10 mg via RECTAL
  Filled 2022-02-16 (×5): qty 1

## 2022-02-16 MED ORDER — SODIUM CHLORIDE 0.9 % IV SOLN: INTRAVENOUS | Status: DC

## 2022-02-16 NOTE — Progress Notes (Signed)
Physical Therapy Session Note ? ?Patient Details  ?Name: Richard Santos ?MRN: [id number] ?Date of Birth: 05-31-1961 ? ?Today's Date: 02/16/2022 ?PT Individual Time: 7414-2395 ?PT Individual Time Calculation (min): 75 min  ? ?Short Term Goals: ?Week 2:  PT Short Term Goal 1 (Week 2): STG=LTG due to ELOS. ? ?Skilled Therapeutic Interventions/Progress Updates:  ?   ?Patient in bed upon PT arrival. Patient alert and agreeable to PT session. Patient reported 5/10 abdomin pain during session, RN made aware. PT provided repositioning, rest breaks, and distraction as pain interventions throughout session.  ? ?Patient continues to endorse B LE numbness and inability to move his legs. Also reports that he has numbness from his waist down, MD and PA made aware, as they both rounded on the patient during session.  ? ?Patient reports his goal is to go home with as minimal caregiver burden on his wife as possible. He also wants to be able to spend time with his children and grandchildren. ? ?Focused session on initiation of lower extremity PROM program. Handout provided with plans to review with patient's wife this afternoon during family education. Performed sitting balance and bed mobility in focus  ? ?Exercises: ?-B DF stretch 2x1 min ?-B hip/knee flexion/extension x10 ?-B hip abduction/adduction x10 ?-B hamstring stretch 2x1 min ?-B hip IR/ER x10 ? ?Donned B TED hose and non-skid socks with total A. Assessed patient's feet prior, noted red blanchable pressure injury to R heel, LPN made aware.  ? ?Performed rolling R/L and supine to/form sit with mod-max A overall, total A for lower extremity management. Provided cues for use of hospital bed features for trunk management for reduced assist with bed mobility. Educated on pressure relief with rolling and change in position in the bed.  ? ?Patient sat EOB >10 min focused on trunk control. Had initial posterior bias, able to self correct and reduce upper extremity use with cues for  forward weight shift and encouragement for increased confidence due to fear of falling. Facilitated erect posture and hands on knees and patient able to progress to supervision in sitting >2 min.  ? ?Patient in bed with heels floating and legs elevated on pillows at end of session with breaks locked, bed alarm set, and all needs within reach. Placed sign over patient's bed to float patient's heels and perform turn schedule to reduce risk of pressure injury, LPN made aware. ? ?Therapy Documentation ?Precautions:  ?Precautions ?Precautions: Fall ?Precaution Comments: truncal and BLE limb incoordination; absent R LE sensation with R knee buckle ?Restrictions ?Weight Bearing Restrictions: No ? ? ? ?Therapy/Group: Individual Therapy ? ?Doreene Burke PT, DPT ? ?02/16/2022, 10:14 AM  ?

## 2022-02-16 NOTE — Progress Notes (Signed)
Physical Therapy Note ? ?Patient Details  ?Name: Richard Santos ?MRN: [id number] ?Date of Birth: 12-Apr-1961 ?Today's Date: 02/16/2022 ? ? ? ?1440: Upon PT arrival, patient and wife in medical team meeting scheduled for the next hour. Patient missed 60 min of skilled PT due to medical meeting , RN made aware. Will attempt to make-up missed time as able.   ? ? ?Doreene Burke PT, DPT ? ?02/16/2022, 2:44 PM  ?

## 2022-02-16 NOTE — Progress Notes (Signed)
? ?Palliative Medicine Inpatient Follow Up Note ? ? 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 He was admitted on 02/03/22 for work up and MRI brain done revealing significant decrease in size of enhancing lesions (in pons, bilateral temporal lobes, left periventricular white matter and left frontal lobe). Decadron increased to 2 mg IV BID. He was placed on CIWA protocol due to hx of daily ETOH (down from 49 drinks to 3 drinks/day?). CT abdomen/pelvis done revealing new 5-10% left PTX, new acute/subacute right 9-10 rib FX, right foraminal impingement at L5/S1, stable appearance of stable left lung nodule and sclerotic metastatic disease involving thoracic spine and proximal femurs.  ?  ?Dr. Earlie Server consulted and recommended maintaining patient on 2 mg bid as he was feeling better. He continues to have RLE weakness with tendency to buckle, needs increased time to process and follow one step commands, has orthostatic changes with standing attempts as well as  right lateral and posterior lean. CIR recommended due to functional decline. ?  ?Palliative care has been asked to get involved due to lack of progress at CIR.  ? ?Today's Discussion (02/16/2022): ? ?*Please note that this is a verbal dictation therefore any spelling or grammatical errors are due to the "[place] [employer]" system interpretation. ? ?Chart reviewed inclusive of vital signs, progress notes, laboratory results, and diagnostic images.  ? ?Family meeting held this afternoon with Reesa Chew, PA, Dr. Mickeal Skinner, Tacey Ruiz, [facility]. ? ?Both Bayne and his wife, [name] were present for the meeting ? ?Olin Hauser was able to provide Carlyn with a comprehensive  update in terms of his progress at rehabilitation.  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  Instead the goals have shifted to providing education to patient and family for a safe transition home. ? ?Dr. 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   He shares that this is something whereby there are many options for treatment and offers the potential for 2 weeks or greater of radiotherapy.  The results of this would likely not allow Teofil to be fully functional person again.  He alternatively offered to change focus to more of comfort and dignity at the end of life. ? ?Christophor is very clear that he values his independence and if he cannot do things like get in the bed himself walk around his home or perform self-care activities he would no longer want to be on this earth. ? ?It is obvious after conversation that patient's wife is information burdened.  We agreed that at this point in time we can hold off on additional conversations until this information is digested. ? ?Family is aware that at this time the best options would be geared towards hospice, comfort, dignity, and quality at the end of life. ? ?A conversation on CODE STATUS was held and Aquil shares that if the results of doing cardiopulmonary resuscitation are not going to change his overall outcome in the setting of his disease that he would not want it.  This too will be rediscussed tomorrow. ? ?Questions and concerns addressed  ? ?  Palliative Support Provided ? ?Objective Assessment: ?Vital Signs ?Vitals:  ? 02/16/22 0745 02/16/22 1255  ?BP:  117/79  ?Pulse:  73  ?Resp:  17  ?Temp:  97.8 ?F (36.6 ?C)  ?SpO2: 97% 99%  ? ? ?Intake/Output Summary (Last 24 hours) at 02/16/2022 1653 ?Last data filed at 02/16/2022 1500 ?Gross per 24 hour  ?Intake 1126.58 ml  ?Output 500 ml  ?Net 626.58 ml  ? ? ?Last Weight   Most recent update: 02/06/2022  8:23 PM  ? ? Weight  ?64.7 kg (142 lb 10.2 oz)  ?      ? ?  ? ?Gen:  Older caucasian M in NAD ?HEENT: moist mucous membranes ?CV: Irregular rate and rhythm ?PULM: On RA ?ABD: soft/nontender ?EXT: No edema ?Neuro: Alert and oriented x3 ? ?SUMMARY OF RECOMMENDATIONS   ?FULL CODE --> This is an ongoing conversation as I worry patient outcomes would be very poor in the setting of his underlying metastatic disease we will reapproach this topic tomorrow. ? 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  Options are to either pursue aggressive radiotherapy over weeks or to transition focus to comfort. ? ?Patient and his wife will talk over the options though he is leaning towards a comfort emphasis. ? ?Ongoing support.  I will not be present tomorrow though my colleague Vinie Sill will follow up with [name] and his spouse, [name] ? ?Time: 15 ? ?MDM - High ?_____________________________________________________________________________________ ?Tacey Ruiz ?[place] Team ?Team Cell Phone: [phone] ?Please utilize secure chat with additional questions, if there is no response within 30 minutes please call the above phone number ? ?Palliative Medicine Team providers are available by phone from 7am to 7pm daily and can be reached through the team cell phone.  ?Should this patient require assistance outside of these hours, please call the patient's attending physician. ? ? ? ? ?

## 2022-02-16 NOTE — Progress Notes (Signed)
Occupational Therapy Session Note ? ?Patient Details  ?Name: Richard Santos ?MRN: [id number] ?Date of Birth: [birth year] ? ?Today's Date: 02/16/2022 ?OT Individual Time: 3838-1840 ?OT Individual Time Calculation (min): 45 min  ? ? ?Short Term Goals: ?Week 2:  OT Short Term Goal 1 (Week 2): STG=LTG d/t ELOS as bed BADL level d/t disease progression/progress ? ?Skilled Therapeutic Interventions/Progress Updates:  ?  Family education session completed with pt and his wife. Verbal education provided re CLOF,  energy conservation strategies, home carryover of transfer training, ADLs, and IADLs. Demonstration completed for pt performance of UB/LB bathing and dressing at max A- dependent level, toileting hygiene and transfers at hoyer level. In depth discussed home barriers, burden of care, and equipment that will be needed. Pt transferred to EOB with max A. Mod A to remain EOB with significant fear of falling. Pt completed a max A slideboard transfer to the TIS w/c. He was left sitting up with all needs met, chair alarm set.  ? ? ?Therapy Documentation ?Precautions:  ?Precautions ?Precautions: Fall ?Precaution Comments: truncal and BLE limb incoordination; absent R LE sensation with R knee buckle ?Restrictions ?Weight Bearing Restrictions: No ? ?Therapy/Group: Individual Therapy ? ?Curtis Sites ?02/16/2022, 6:35 AM ?

## 2022-02-16 NOTE — Telephone Encounter (Signed)
I have left a detailed message for the pt advising he would need RAD and he is currently a pt of Dr. Ida Rogue. Pt advised to call back with additional questions. ?

## 2022-02-16 NOTE — Telephone Encounter (Signed)
New findings on mri thoracic spine. Pam Love from rehab called: ? ?Does Richard Santos  have anything to offer pt ? ?Requesting Dr Richard Santos to see pt. ? ? Dr Mickeal Skinner will see pt today around 2:30. ?"I reviewed case with Dr. Mickeal Skinner yesterday. Essentially it would be up to the patient if he wants to pursue further work up including spine MRI and lab work up (CSF). I asked the patient if he wanted to pursue and he was not sure." ? Referred to palliative care by rehab. ? ? ? ? ?

## 2022-02-16 NOTE — Consult Note (Signed)
[facility] ?Neuro-Oncology Consult Note ? ?Patient Care Team: ?Curt Bears, MD as PCP - General (Oncology) ?Valrie Hart, RN as Oncology Nurse Navigator ? ?CHIEF COMPLAINTS/PURPOSE OF CONSULTATION:  ?Bilateral Leg Weakness ? ?HISTORY OF PRESENTING ILLNESS:  ?Richard Santos 61 y.o. male presents with several weeks of progressive bilateral leg weakness.  Symptoms have worsened despite higher dose steroids, aggressive rehabilitation services.  He is no longer walking at all on his own, or with any meaningful use of his legs.  Upon admission to rehab 9 days prior he was walking independently.    ? ?MEDICAL HISTORY:  ?Past Medical History:  ?Diagnosis Date  ? Allergic rhinitis   ? Allergy   ? Anxiety   ? Chronic low back pain   ? DDD (degenerative disc disease), lumbar   ? ED (erectile dysfunction)   ? Finger injury   ? cut pads off 3rd and 4th finger left hand/due to lawn mower accident  ? GERD (gastroesophageal reflux disease)   ? Heart murmur   ? as child only-   ? History of kidney stones   ? HTN (hypertension) 08/20/2016  ? Hyperlipidemia   ? Incomplete right bundle branch block   ? Prostate cancer [facility]) dx 10/02/14  ? stage T1c  ? Sigmoid diverticulosis   ? Small cell lung cancer ([place]) 03/2020  ? Wears glasses   ? ? ?SURGICAL HISTORY: ?Past Surgical History:  ?Procedure Laterality Date  ? BRONCHIAL BIOPSY  04/12/2020  ? Procedure: BRONCHIAL BIOPSIES;  Surgeon: Marshell Garfinkel, MD;  Location: [place] [facility];  Service: Cardiopulmonary;;  ? BRONCHIAL BRUSHINGS  04/12/2020  ? Procedure: BRONCHIAL BRUSHINGS;  Surgeon: Marshell Garfinkel, MD;  Location: [place] [facility];  Service: Cardiopulmonary;;  ? BRONCHIAL NEEDLE ASPIRATION BIOPSY  04/12/2020  ? Procedure: BRONCHIAL NEEDLE ASPIRATION BIOPSIES;  Surgeon: Marshell Garfinkel, MD;  Location: [facility];  Service: Cardiopulmonary;;  ? BRONCHIAL WASHINGS  04/12/2020  ? Procedure: BRONCHIAL WASHINGS;  Surgeon: Marshell Garfinkel, MD;  Location: [place] [facility];  Service:  Cardiopulmonary;;  ? COLONOSCOPY    ? COLONOSCOPY W/ POLYPECTOMY  04-19-2012  ? ENDOBRONCHIAL ULTRASOUND N/A 04/12/2020  ? Procedure: ENDOBRONCHIAL ULTRASOUND;  Surgeon: Marshell Garfinkel, MD;  Location: [facility];  Service: Cardiopulmonary;  Laterality: N/A;  ? ESOPHAGOGASTRODUODENOSCOPY  08-05-2010  ? HEMOSTASIS CONTROL  04/12/2020  ? Procedure: HEMOSTASIS CONTROL;  Surgeon: Marshell Garfinkel, MD;  Location: [facility];  Service: Cardiopulmonary;;  ? IR IMAGING GUIDED PORT INSERTION  05/31/2020  ? POLYPECTOMY    ? PROSTATE BIOPSY  10/02/14  ? RADIOACTIVE SEED IMPLANT N/A 01/30/2015  ? Procedure: RADIOACTIVE SEED IMPLANT;  Surgeon: Rana Snare, MD;  Location: [facility];  Service: Urology;  Laterality: N/A;  ? UPPER GASTROINTESTINAL ENDOSCOPY  04/03/2021  ? VIDEO BRONCHOSCOPY N/A 04/12/2020  ? Procedure: VIDEO BRONCHOSCOPY WITHOUT FLUORO;  Surgeon: Marshell Garfinkel, MD;  Location: [facility];  Service: Cardiopulmonary;  Laterality: N/A;  ? ? ?SOCIAL HISTORY: ?Social History  ? ?Socioeconomic History  ? Marital status: Married  ?  Spouse name: Not on file  ? Number of children: 0  ? Years of education: Not on file  ? Highest education level: Not on file  ?Occupational History  ? Occupation: Public relations account executive  ?  Employer: [employer]  ?Tobacco Use  ? Smoking status: Every Day  ?  Packs/day: 0.15  ?  Years: 25.00  ?  Pack years: 3.75  ?  Types: Cigarettes  ? Smokeless tobacco: Never  ? Tobacco comments:  ?  smokes 10 cigs per day 08/26/21  ?Vaping Use  ? Vaping Use: Former  ?Substance and Sexual Activity  ? Alcohol use: Yes  ?  Alcohol/week: 49.0 standard drinks  ?  Types: 25 Cans of beer, 24 Standard drinks or equivalent per week  ?  Comment: 5 beer per day  ? Drug use: No  ? Sexual activity: Not Currently  ?Other Topics Concern  ? Not on file  ?Social History Narrative  ? 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 He is married with no biological children.  ? ?Social  Determinants of Health  ? ?Financial Resource Strain: Not on file  ?Food Insecurity: Not on file  ?Transportation Needs: Not on file  ?Physical Activity: Not on file  ?Stress: Not on file  ?Social Connections: Not on file  ?Intimate Partner Violence: Not on file  ? ? ?FAMILY HISTORY: ?Family History  ?Problem Relation Age of Onset  ? Prostate cancer Father   ? Heart disease Father   ? Heart disease Mother   ? Breast cancer Sister   ? Heart disease Other   ? Colon cancer Neg Hx   ? Colon polyps Neg Hx   ? Esophageal cancer Neg Hx   ? Rectal cancer Neg Hx   ? Stomach cancer Neg Hx   ? ? ?ALLERGIES:  is allergic to bee venom, elemental sulfur, cosentyx [secukinumab], other, sulfa antibiotics, urecholine [bethanechol], and namenda [memantine]. ? ?MEDICATIONS:  ?Current Facility-Administered Medications  ?Medication Dose Route Frequency Provider Last Rate Last Admin  ? 0.9 %  sodium chloride infusion   Intravenous Continuous Meredith Staggers, MD 75 mL/hr at 02/16/22 0622 New Bag at 02/16/22 0622  ? acetaminophen (TYLENOL) tablet 325-650 mg  325-650 mg Oral Q4H PRN Bary Leriche, PA-C   650 mg at 02/15/22 2129  ? alum & mag hydroxide-simeth (MAALOX/MYLANTA) 200-200-20 MG/5ML suspension 30 mL  30 mL Oral Q4H PRN Bary Leriche, PA-C   30 mL at 02/16/22 0759  ? aspirin EC tablet 81 mg  81 mg Oral Daily Bary Leriche, PA-C   81 mg at 02/16/22 6195  ? bisacodyl (DULCOLAX) suppository 10 mg  10 mg Rectal Daily PRN Love, Pamela S, PA-C      ? bisacodyl (DULCOLAX) suppository 10 mg  10 mg Rectal QPC supper Love, Ivan Anchors, PA-C      ? Chlorhexidine Gluconate Cloth 2 % PADS 6 each  6 each Topical BID Meredith Staggers, MD   6 each at 02/16/22 [phone]  ? dexamethasone (DECADRON) tablet 4 mg  4 mg Oral Q8H Meredith Staggers, MD   4 mg at 02/16/22 1354  ? diphenhydrAMINE (BENADRYL) 12.5 MG/5ML elixir 12.5-25 mg  12.5-25 mg Oral Q6H PRN Love, Pamela S, PA-C      ? divalproex (DEPAKOTE) DR tablet 250 mg  250 mg Oral BID Reesa Chew S,  PA-C   250 mg at 02/16/22 6712  ? enoxaparin (LOVENOX) injection 40 mg  40 mg Subcutaneous Q24H Reesa Chew S, PA-C   40 mg at 02/16/22 1204  ? flavoxATE (URISPAS) tablet 100 mg  100 mg Oral TID PRN Bary Leriche, PA-C   100 mg at 02/14/22 4580  ? folic acid (FOLVITE) tablet 1 mg  1 mg Oral Daily Bary Leriche, PA-C   1 mg at 02/16/22 9983  ? guaiFENesin-dextromethorphan (ROBITUSSIN DM) 100-10 MG/5ML syrup 5-10 mL  5-10 mL Oral Q6H PRN Bary Leriche, PA-C      ?  heparin lock flush 100 unit/mL  500 Units Intracatheter Q30 days Alger Simons T, MD      ? And  ? heparin lock flush 100 unit/mL  500 Units Intracatheter PRN Meredith Staggers, MD   500 Units at 02/10/22 1024  ? ipratropium-albuterol (DUONEB) 0.5-2.5 (3) MG/3ML nebulizer solution 3 mL  3 mL Nebulization Q6H PRN Love, Pamela S, PA-C      ? lidocaine (XYLOCAINE) 2 % jelly   Topical PRN Bary Leriche, PA-C   Given at 02/09/22 1042  ? lidocaine (XYLOCAINE) 2 % jelly   Topical PRN Love, Pamela S, PA-C      ? umeclidinium bromide (INCRUSE ELLIPTA) 62.5 MCG/ACT 1 puff  1 puff Inhalation Daily Bary Leriche, PA-C   1 puff at 02/16/22 0740  ? And  ? mometasone-formoterol (DULERA) 100-5 MCG/ACT inhaler 2 puff  2 puff Inhalation BID Bary Leriche, PA-C   2 puff at 02/16/22 0740  ? multivitamin with minerals tablet 1 tablet  1 tablet Oral Daily Bary Leriche, PA-C   1 tablet at 02/16/22 0756  ? polyethylene glycol (MIRALAX / GLYCOLAX) packet 17 g  17 g Oral Daily PRN Bary Leriche, PA-C   17 g at 02/16/22 0800  ? prochlorperazine (COMPAZINE) tablet 5-10 mg  5-10 mg Oral Q6H PRN Bary Leriche, PA-C   5 mg at 02/15/22 0102  ? Or  ? prochlorperazine (COMPAZINE) injection 5-10 mg  5-10 mg Intramuscular Q6H PRN Love, Ivan Anchors, PA-C      ? Or  ? prochlorperazine (COMPAZINE) suppository 12.5 mg  12.5 mg Rectal Q6H PRN Bary Leriche, PA-C      ? [START ON 02/17/2022] senna-docusate (Senokot-S) tablet 2 tablet  2 tablet Oral QPC breakfast Love, Pamela S, PA-C      ?  sodium chloride flush (NS) 0.9 % injection 10-40 mL  10-40 mL Intracatheter PRN Meredith Staggers, MD      ? sodium phosphate (FLEET) 7-19 GM/118ML enema 1 enema  1 enema Rectal Once PRN Love, Ivan Anchors,

## 2022-02-16 NOTE — Progress Notes (Signed)
?                                                       PROGRESS NOTE ? ? ?Subjective/Complaints: ?Complains of constipation- starting on enema/suppository bowel program. Cherie plans try to get him to the bedside commode today ? ? ?ROS: Patient denies fever, to rash, sore throat, blurred vision, dizziness, nausea, vomiting, diarrhea, cough, shortness of breath or chest pain, joint or back/neck pain, headache, or mood change. +constipation ? ?Objective: ?  ?MR THORACIC SPINE W WO CONTRAST ? 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 Chronic low back pain EXAM: MRI THORACIC AND LUMBAR SPINE WITHOUT AND WITH CONTRAST TECHNIQUE: Multiplanar and multiecho pulse sequences of the thoracic and lumbar spine were obtained without and with intravenous contrast. CONTRAST:  68mL GADAVIST GADOBUTROL 1 MMOL/ML IV SOLN COMPARISON:  CT 02/04/2022 fall FINDINGS: MRI THORACIC SPINE FINDINGS Alignment:  Physiologic. Vertebrae: 1.5 cm enhancing bone lesion within the anterior aspect of the T7 vertebral body. Additional smaller enhancing lesions noted within the posterior elements of the T5, T6, and T7 levels including lesion centered at the right T7 pedicle. Additional marrow replacing lesion is seen within the proximal sternum (series 3, image 8). No pathologic fracture. No evidence of discitis. Cord: Large enhancing intramedullary lesion within the lower thoracic cord at the T9 and T10 level extending 4.4 cm in length (series 17, image 8). Lesion is minimally expansile. Syrinx above and below the lesion extending cranially to the T7-8 disc level and caudally to the level of the T11 inferior endplate (series 4, image 9). No additional lesions are seen within the thoracic cord. No abnormal extramedullary enhancement. Paraspinal and other soft tissues: Post treatment changes within the visualized left lung, not well assessed. Disc levels: No focal disc protrusion. No foraminal or canal  stenosis at any level. MRI LUMBAR SPINE FINDINGS Segmentation:  Standard. Alignment:  Physiologic. Vertebrae: No marrow replacing bone lesions are seen within the lumbar spine or proximal sacrum. No evidence of discitis. No fracture. Conus medullaris: Extends to the L1 level and appears normal. No mass lesion or abnormal postcontrast enhancement. Paraspinal and other soft tissues: None. Disc levels: T12-L1 through L3-L4: Unremarkable. L4-L5: Diffuse disc bulge with left foraminal/extraforaminal protrusion. Mild bilateral facet arthropathy. Mild mass effect upon the exited left L4 nerve root. No foraminal or canal stenosis. L5-S1: Disc height loss with diffuse disc osteophyte complex. Mild bilateral facet arthropathy. Mild-moderate right foraminal stenosis. No canal stenosis. IMPRESSION: 1. Large enhancing intramedullary lesion within the lower thoracic cord at the T9 and T10 level extending 4.4 cm in length with surrounding syrinx. Findings are most compatible with metastatic disease given history of malignancy. 2. Osseous metastatic lesions within the T7 vertebral body and posterior elements of the T5, T6, and T7 levels as well as within the proximal sternum. 3. Lumbar spondylosis with mild-moderate right foraminal stenosis at L5-S1. At L4-L5, there is a left foraminal/extraforaminal protrusion resulting in mild mass effect upon the exited left L4 nerve root. 4. No foraminal or canal stenosis at any level within the thoracic spine. Electronically Signed   By: Davina Poke D.O.   On: 02/15/2022 16:23  ? ?MR Lumbar Spine W Wo Contrast ? 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 Chronic low  back pain EXAM: MRI THORACIC AND LUMBAR SPINE WITHOUT AND WITH CONTRAST TECHNIQUE: Multiplanar and multiecho pulse sequences of the thoracic and lumbar spine were obtained without and with intravenous contrast. CONTRAST:  29mL GADAVIST GADOBUTROL 1 MMOL/ML IV SOLN COMPARISON:  CT  02/04/2022 fall FINDINGS: MRI THORACIC SPINE FINDINGS Alignment:  Physiologic. Vertebrae: 1.5 cm enhancing bone lesion within the anterior aspect of the T7 vertebral body. Additional smaller enhancing lesions noted within the posterior elements of the T5, T6, and T7 levels including lesion centered at the right T7 pedicle. Additional marrow replacing lesion is seen within the proximal sternum (series 3, image 8). No pathologic fracture. No evidence of discitis. Cord: Large enhancing intramedullary lesion within the lower thoracic cord at the T9 and T10 level extending 4.4 cm in length (series 17, image 8). Lesion is minimally expansile. Syrinx above and below the lesion extending cranially to the T7-8 disc level and caudally to the level of the T11 inferior endplate (series 4, image 9). No additional lesions are seen within the thoracic cord. No abnormal extramedullary enhancement. Paraspinal and other soft tissues: Post treatment changes within the visualized left lung, not well assessed. Disc levels: No focal disc protrusion. No foraminal or canal stenosis at any level. MRI LUMBAR SPINE FINDINGS Segmentation:  Standard. Alignment:  Physiologic. Vertebrae: No marrow replacing bone lesions are seen within the lumbar spine or proximal sacrum. No evidence of discitis. No fracture. Conus medullaris: Extends to the L1 level and appears normal. No mass lesion or abnormal postcontrast enhancement. Paraspinal and other soft tissues: None. Disc levels: T12-L1 through L3-L4: Unremarkable. L4-L5: Diffuse disc bulge with left foraminal/extraforaminal protrusion. Mild bilateral facet arthropathy. Mild mass effect upon the exited left L4 nerve root. No foraminal or canal stenosis. L5-S1: Disc height loss with diffuse disc osteophyte complex. Mild bilateral facet arthropathy. Mild-moderate right foraminal stenosis. No canal stenosis. IMPRESSION: 1. Large enhancing intramedullary lesion within the lower thoracic cord at the T9 and  T10 level extending 4.4 cm in length with surrounding syrinx. Findings are most compatible with metastatic disease given history of malignancy. 2. Osseous metastatic lesions within the T7 vertebral body and posterior elements of the T5, T6, and T7 levels as well as within the proximal sternum. 3. Lumbar spondylosis with mild-moderate right foraminal stenosis at L5-S1. At L4-L5, there is a left foraminal/extraforaminal protrusion resulting in mild mass effect upon the exited left L4 nerve root. 4. No foraminal or canal stenosis at any level within the thoracic spine. Electronically Signed   By: Davina Poke D.O.   On: 02/15/2022 16:23   ?Recent Labs  ?  02/16/22 ?0403  ?WBC 8.4  ?HGB 10.3*  ?HCT 30.4*  ?PLT 231  ? ? ?Recent Labs  ?  02/16/22 ?0403  ?NA 137  ?K 4.1  ?CL 102  ?CO2 27  ?GLUCOSE 112*  ?BUN 41*  ?CREATININE 0.77  ?CALCIUM 9.4  ? ? ? ?Intake/Output Summary (Last 24 hours) at 02/16/2022 1540 ?Last data filed at 02/16/2022 1500 ?Gross per 24 hour  ?Intake 1126.58 ml  ?Output 500 ml  ?Net 626.58 ml  ?  ? ?  ? ?Physical Exam: ?Vital Signs ?Blood pressure 117/79, pulse 73, temperature 97.8 ?F (36.6 ?C), temperature source Oral, resp. rate 17, height 6' (1.829 m), weight 64.7 kg, SpO2 99 %. ? ?Gen: no distress, normal appearing ?HEENT: oral mucosa pink and moist, NCAT ?Cardio: Reg rate ?Chest: normal effort, normal rate of breathing ?Abd: soft, non-distended ?Ext: no edema ?Psych: pleasant, normal affect ?Musculoskeletal:  ?  Cervical back: Neck supple. No back tenderness.  ? ?Skin: ?   General: Skin is warm and dry.  ?   Comments: Scab on right knee and healing abrasions left knee.  ?Port that's accessed in R chest;--stable appearing ?  ?Neurological:  ?   Mental Status:alert, delayed processing. Fair awareness. Mild right facial weakness. Occasional intentional tremor RUE.  Now has BLE sensory deficits for LT/pain/proprioception, ~T8-10 sensory level. Unable to move either leg. UE 4/5. No sensory findings  in UE's.  ? ?Assessment/Plan: ?1. Functional deficits which require 3+ hours per day of interdisciplinary therapy in a comprehensive inpatient rehab setting. ?Physiatrist is providing close team supervis

## 2022-02-16 NOTE — Plan of Care (Signed)
?  Problem: Consults ?Goal: RH GENERAL PATIENT EDUCATION ?Description: See Patient Education module for education specifics. ?Outcome: Progressing ?  ?Problem: RH BOWEL ELIMINATION ?Goal: RH STG MANAGE BOWEL WITH ASSISTANCE ?Description: STG Manage Bowel with mod I  Assistance. ?Outcome: Progressing ?Goal: RH STG MANAGE BOWEL W/MEDICATION W/ASSISTANCE ?Description: STG Manage Bowel with Medication with mod I Assistance. ?Outcome: Progressing ?  ?Problem: RH BLADDER ELIMINATION ?Goal: RH STG MANAGE BLADDER WITH ASSISTANCE ?Description: STG Manage Bladder With mod I Assistance ?Outcome: Progressing ?Goal: RH STG MANAGE BLADDER WITH MEDICATION WITH ASSISTANCE ?Description: STG Manage Bladder With Medication Withmod I  Assistance. ?Outcome: Progressing ?  ?Problem: RH SAFETY ?Goal: RH STG ADHERE TO SAFETY PRECAUTIONS W/ASSISTANCE/DEVICE ?Description: STG Adhere to Safety Precautions With cues Assistance/Device. ?Outcome: Progressing ?  ?Problem: RH PAIN MANAGEMENT ?Goal: RH STG PAIN MANAGED AT OR BELOW PT'S PAIN GOAL ?Description: At or < 4 w prn  ?Outcome: Progressing ?  ?Problem: RH KNOWLEDGE DEFICIT GENERAL ?Goal: RH STG INCREASE KNOWLEDGE OF SELF CARE AFTER HOSPITALIZATION ?Description: Patient and wife will be able to manage care at discharge using handouts and educational resources independently ?Outcome: Progressing ?  ?

## 2022-02-16 NOTE — Consult Note (Signed)
Neuropsychological Consultation ? ? ?Patient:   Richard Santos  ? ?DOB:   1960-12-25 ? ?MR Number:  [id number] ? ?Location:  [place] ?[address] ?[id number] ?[place] [address] ?Dept: [phone] ?Loc: [phone] ?          ?Date of Service:   02/16/2022 ? ?Start Time:   8 AM ?End Time:   9 AM ? ?Provider/Observer:  Ilean Skill, Psy.D.   ?    Clinical Neuropsychologist ?     ? ?Billing Code/Service: [id number] ? 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  Patient had brain XRT one month ago and decadron taper stated having increased weakness with falls and dragging RLE.  MRI brain done revealed significant decrease in size of enhancing lesions.  They were found in pons, bilateral temporal lobes, left periventricular white matter and left frontal lobe.    Decadron increased.  Patient with continued RLE weakness and CIR recommended due to functional decline.   ? ?Reason for Service:  Patient was referred for neuropsychological consultation due to coping and adjustment.  Patient has had consultation with palliative care as well but plans to continue to work on his therapeutic interventions.  Below is the HPI for the current admission. ? 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 He was admitted on 02/03/22 for work up and MRI brain done revealing significant decrease in size of enhancing lesions (in pons, bilateral temporal lobes, left periventricular white matter and left frontal lobe). Decadron  increased to 2 mg IV BID. He was placed on CIWA protocol due to hx of daily ETOH (down from 49 drinks to 3 drinks/day?). CT abdomen/pelvis done revealing new 5-10% left PTX, new acute/subacute right 9-10 rib FX, right foraminal impingement at L5/S1, stable appearance of stable left lung nodule and sclerotic metastatic disease involving thoracic spine and proximal femurs.  ?  ?Dr. Earlie Server consulted and recommended maintaining patient on 2 mg bid as he was feeling better. He continues to have RLE weakness with tendency to buckle, needs increased time to process and follow one step commands, has orthostatic changes with standing attempts as well as  right lateral and posterior lean. CIR recommended due to functional decline. ? ?Current Status:  The patient was awake and somewhat drowsy when I entered the room but maintained but was oriented in a way consistent with his level of drowsiness.  Patient was aware of his illness and medical status but concerned about his prognosis.  Patient was realistic overall but unclear as to some of the goals with his recent worsening of physical functioning. ? ?Behavioral Observation: Richard Santos  presents as a 60 y.o.-year-old Left handed Caucasian Male who appeared his stated age. his dress was Appropriate and he was Well Groomed and his manners were Appropriate to the situation.  his participation was indicative of Drowsy behaviors.  There were physical disabilities noted.  he displayed an appropriate level of cooperation and motivation.   ? ? ?Interactions:  Minimal Drowsy ? ?Attention:   abnormal and attention span appeared shorter than expected for age ? ?Memory:   within normal limits; recent and remote memory intact ? ?Visuo-spatial:  not examined ? ?Speech (Volume):  low ? ?Speech:   normal; normal ? ?Thought Process:  Coherent and Circumstantial ? ?Though Content:  WNL; not suicidal and not homicidal ? ?Orientation:   person, place, and  situation ? ?Judgment:   Fair ? ?Planning:   Poor ? ?Affect:    Depressed and Lethargic ? ?Mood:    Dysphoric ? ?Insight:   Fair ? ?Intelligence:   normal ? ?Substance Use:  There is a documented history of alcohol abuse confirmed by the patient.   ? ?Medical History:   ?Past Medical History:  ?Diagnosis Date  ? Allergic rhinitis   ? Allergy   ? Anxiety   ? Chronic low back pain   ? DDD (degenerative disc disease), lumbar   ? ED (erectile dysfunction)   ? Finger injury   ? cut pads off 3rd and 4th finger left hand/due to lawn mower accident  ? GERD (gastroesophageal reflux disease)   ? Heart murmur   ? as child only-   ? History of kidney stones   ? HTN (hypertension) 08/20/2016  ? Hyperlipidemia   ? Incomplete right bundle branch block   ? Prostate cancer [facility]) dx 10/02/14  ? stage T1c  ? Sigmoid diverticulosis   ? Small cell lung cancer ([place]) 03/2020  ? Wears glasses   ? ? ?     ?Patient Active Problem List  ? Diagnosis Date Noted  ? Cancer [facility])   ? Orthostatic hypotension 02/06/2022  ? Debility 02/06/2022  ? Hypomagnesemia 02/05/2022  ? Multiple rib fractures 02/05/2022  ? Pneumothorax 02/05/2022  ? Recurrent falls 02/04/2022  ? Pancytopenia ([place]) 02/04/2022  ? Generalized weakness 02/03/2022  ? Alcohol use 02/03/2022  ? 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 Antineoplastic chemotherapy induced anemia 08/04/2021  ? Protein-calorie malnutrition, severe 06/04/2021  ? 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 Acute hyponatremia 05/21/2021  ? Hyponatremia 05/20/2021  ? Metastatic cancer to brain [facility]) 09/05/2020  ? Port-A-Cath in place 06/03/2020  ? Emphysema of lung ([place]) 04/26/2020  ? Small cell lung cancer, left upper lobe ([place]) 04/18/2020  ? Encounter for antineoplastic chemotherapy 04/18/2020  ? Encounter for antineoplastic immunotherapy 04/18/2020  ? Goals of care, counseling/discussion 04/18/2020  ? Syncope 04/11/2020  ? Lung mass 04/11/2020  ? History of  prostate cancer 04/11/2020  ? Chest pain 04/03/2020  ? Vitamin D deficiency 12/15/2019  ? Dysphagia 07/17/2017  ? HTN (hypertension) 08/20/2016  ? Malignant neoplasm of prostate ([place]) 11/29/2014  ? Bee sting allergy 07/31/2014  ? Elevated PSA 04/14/2013  ? Lumbar disc disease 04/14/2013  ? Personal history of colonic adenoma 04/26/2012  ? Impaired glucose tolerance 02/27/2012  ? Encounter for well adult exam with abnormal findings 02/27/2012  ? Chronic low back pain   ? Alcohol abuse 12/31/2010  ? Functional Dyspepsia 03/11/2009  ? Hyperlipidemia 01/16/2008  ? Anxiety state 01/16/2008  ? ERECTILE DYSFUNCTION 01/16/2008  ? Tobacco abuse 01/16/2008  ? ALLERGIC RHINITIS 01/16/2008  ? GERD 01/16/2008  ? ? ?Psychiatric History:  No prior psychiatric history noted ? ?Family Med/Psych History:  ?Family History  ?Problem Relation Age of Onset  ? Prostate cancer Father   ? Heart disease Father   ? Heart disease Mother   ? Breast cancer Sister   ?  Heart disease Other   ? Colon cancer Neg Hx   ? Colon polyps Neg Hx   ? Esophageal cancer Neg Hx   ? Rectal cancer Neg Hx   ? Stomach cancer Neg Hx   ? ? 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  Patient had brain XRT one month ago and decadron taper stated having increased weakness with falls and dragging RLE.  MRI brain done revealed significant decrease in size of enhancing lesions.  They were found in pons, bilateral temporal lobes, left periventricular white matter and left frontal lobe.    Decadron increased.  Patient with continued RLE weakness and CIR recommended due to functional decline.  ? ?The patient was awake and somewhat drowsy when I entered the room but maintained but was oriented in a way consistent with his level of drowsiness.  Patient was aware of his illness and medical status but concerned about his prognosis.  Patient  was realistic overall but unclear as to some of the goals with his recent worsening of physical functioning. ? ?Disposition/Plan:  Today we worked on coping and adjustment issues with his cancer diagnosis and medical status.  We talked

## 2022-02-16 NOTE — Progress Notes (Addendum)
Patient refused to go to radiology. Patient stated that he did not want go. Patient educated to what radiology was for, patient still stated no. ?Sanda Linger, LPN  ?

## 2022-02-17 ENCOUNTER — Inpatient Hospital Stay: Payer: 59 | Admitting: Internal Medicine

## 2022-02-17 ENCOUNTER — Inpatient Hospital Stay: Payer: 59 | Admitting: Nurse Practitioner

## 2022-02-17 DIAGNOSIS — C801 Malignant (primary) neoplasm, unspecified: Secondary | ICD-10-CM

## 2022-02-17 DIAGNOSIS — R339 Retention of urine, unspecified: Secondary | ICD-10-CM | POA: Diagnosis not present

## 2022-02-17 DIAGNOSIS — R5381 Other malaise: Secondary | ICD-10-CM | POA: Diagnosis not present

## 2022-02-17 DIAGNOSIS — C7931 Secondary malignant neoplasm of brain: Secondary | ICD-10-CM | POA: Diagnosis not present

## 2022-02-17 DIAGNOSIS — I951 Orthostatic hypotension: Secondary | ICD-10-CM | POA: Diagnosis not present

## 2022-02-17 MED ORDER — HYDROCORTISONE 0.5 % EX CREA
TOPICAL_CREAM | Freq: Four times a day (QID) | CUTANEOUS | Status: DC | PRN
  Filled 2022-02-17: qty 28.35

## 2022-02-17 NOTE — Progress Notes (Signed)
Physical Therapy Session Note ? ?Patient Details  ?Name: Richard Santos ?MRN: [id number] ?Date of Birth: April 25, 1961 ? ?Today's Date: 02/17/2022 ?PT Individual Time: 1030-1100 and 1415-1445 ?PT Individual Time Calculation (min): 30 min and 30 min ? ?Short Term Goals: ?Week 2:  PT Short Term Goal 1 (Week 2): STG=LTG due to ELOS. ? ?Skilled Therapeutic Interventions/Progress Updates:  ?   ?Session 1: ?Patient in bed upon PT arrival. Patient alert and agreeable to PT session, reports increased fatigue and stated, "I don't quite have my wits about me." Patient denied pain during session. ? ?Therapeutic Activity: ?Performed rolling R/L and supine to/form sit with mod-max A overall, total A for lower extremity management. Provided cues for use of hospital bed features for trunk management for reduced assist with bed mobility. Reinforced education on pressure relief with rolling and change in position in the bed. Patient with smear BM noted, changed incontinence brief, pulled pants down/up, and performed peri-care with total A during rolling. ? ?Therapeutic Exercise: ?Patient performed the following exercises with verbal and tactile cues for proper technique. ?Exercises: ?-B DF stretch 2x1 min ?-B hip/knee flexion/extension x5 ?-B hamstring stretch 2x1 min ?-B hip IR/ER x5 ? ?During exercises patient talked through the medical plan options that were discussed with him yesterday. Provided therapeutic listening and coping strategies to provide comfort to him during this difficult time. Patient appreciative.  ? ?Patient requested to get out of the room with his wife this afternoon, PT to accommodate request during family education session this afternoon.  ? ?Patient in bed at end of session with breaks locked, bed alarm set, and all needs within reach.  ? ?Session 2: ?Patient in bed with his wife at bedside upon PT arrival. Patient reported increased fatigue this afternoon and declined offer to go outside. Patient's wife informed  PT that they were considering HH therapies versus Hospice services and she would like to work toward [facility] therapies at this time. Educated on equipment needs, hospital bed, TIS w/c, and Hoyer lift, demonstrated use of TIS w/c and discussed limitations to transport, educated on pressure relief schedule, and reviewed/demonstrated rolling and positioning for pressure relief in the bed. Patient and his wife report that family will be in town for ~1 week then it will just be his wife as his caregiver. Educated on significant burden of care and limited resources and aid available without Hospice Care.  ? ?Patient's wife with questions regarding hospital bed and w/c measurements. Informed her that would need to be confirmed with CSW and ATP. Stepped out to discuss with CSW when NP from [place] arrived to discuss patient goals with the patient and his wife.  ? ?Patient missed 45 min of skilled PT due to palliative discussion, RN made aware. Will attempt to make-up missed time as able.    ? ?Patient in bed with his wife at bedside at end of session with breaks locked, bed alarm set, and all needs within reach.  ? ?Therapy Documentation ?Precautions:  ?Precautions ?Precautions: Fall ?Precaution Comments: truncal and BLE limb incoordination; absent R LE sensation with R knee buckle ?Restrictions ?Weight Bearing Restrictions: No ? ? ? ?Therapy/Group: Individual Therapy ? ?Doreene Burke PT, DPT ? ?02/17/2022, 3:47 PM  ?

## 2022-02-17 NOTE — Patient Care Conference (Signed)
Inpatient RehabilitationTeam Conference and Plan of Care Update ?Date: 02/17/2022   Time: 10:02 AM  ? ? ?Patient Name: Richard Santos      ?Medical Record Number: [id number]  ?Date of Birth: 12/30/1960 ?Sex: Male         ?Room/Bed: 4W11C/4W11C-01 ?Payor Info: Payor: [employer] / Plan: [place] / Product Type: *No Product type* /   ? ?Admit Date/Time:  02/06/2022  6:01 PM ? ?Primary Diagnosis:  Debility ? ?Hospital Problems: Principal Problem: ?  Debility ?Active Problems: ?  Cancer ([place]) ? ? ? ?Expected Discharge Date: Expected Discharge Date:  (Palliative/Hospice) ? ?Team Members Present: ?Physician leading conference: Dr. Alger Simons ?Social Worker Present: [name], LCSWA ?Nurse Present: Dorthula Nettles, RN ?PT Present: Apolinar Junes, PT ?OT Present: Laverle Hobby, OT ?SLP Present: Weston Anna, SLP ?PPS Coordinator present : Ileana Ladd, PT ? ?   Current Status/Progress Goal Weekly Team Focus  ?Bowel/Bladder ? ? incontinent/conitinent to bowel, Foleyin place for retention, LBM 3/27  regain continence  toilet as needed   ?Swallow/Nutrition/ Hydration ? ?           ?ADL's ? ? Deline- max A slideboard transfers. Mod-max A sitting balance. Total A LB, mod A UB  Goals adjusted to reflect caregiver goals, max A from bed level  caregiver education, BADL retraining, balance, activity tolerance   ?Mobility ? ? Further functional decline without muscle activation or sensation in B lower extremities, mod-max A bed mobility, dependent lift transfers, mod-supervision sitting balance, sitting tolerance >2 hours in TIS w/c.  Caregiver independence in performing Hoyer lift transfers and ROM HEP.  Patient/caregiver education, bed mobility, Hoyer lift training, sitting tolerance, sitting balance, d/c planning.   ?Communication ? ?           ?Safety/Cognition/ Behavioral Observations ?           ?Pain ? ? patient denies pain  denies pain  assess pain q 4hr and prn   ?Skin ? ? healing abrasion to  right knee and left elbow  wound to show signs of healing, no odor or drainage  assess skin q shift and prn   ? ? ?Discharge Planning:  ?D/c to home with 24/7 care from wife. Fam edu M-W (3/27-3/29) 1pm-4pm with pt wife.   ?Team Discussion: ?Palliative/Hospice care. Education provided to spouse. Continue foley, incontinent bowel. Reports abdominal pain and penial pain. Skin tear to left buttock, treating appropriately. Therapy goals adjusted. Recommending Hospice facility. Patient has major fear of falling.  ? ?Patient on target to meet rehab goals: ?Goals adjusted related to medical decline. Max assist slide board. Total assist lower body, max assist upper body. No sensation, no movement BLE. Now in tilt-n-space WC. ? ?*See Care Plan and progress notes for long and short-term goals.  ? ?Revisions to Treatment Plan:  ?Adjusting medications ?  ?Teaching Needs: ?Family education, foley care education, transfer training, etc. ?  ?Current Barriers to Discharge: ?Decreased caregiver support, Neurogenic bowel and bladder, Lack of/limited family support, and health decline ? ?Possible Resolutions to Barriers: ?Family education ?Admission to [facility] ?  ? ? Medical Summary ?Current Status: pt with metastatic lung cancer to thoracic cord. pt unable to use legs, neurogenic bladder ? Barriers to Discharge: Medical stability;Other (comments) ? Barriers to Discharge Comments: mestatic lung cancer ?Possible Resolutions to [employer] Focus: hospice/palliative care assistance with placement ? ? ?Continued Need for Acute Rehabilitation Level of Care: The patient requires daily medical management by a physician with  specialized training in physical medicine and rehabilitation for the following reasons: ?Direction of a multidisciplinary physical rehabilitation program to maximize functional independence : Yes ?Medical management of patient stability for increased activity during participation in an intensive rehabilitation  regime.: Yes ?Analysis of laboratory values and/or radiology reports with any subsequent need for medication adjustment and/or medical intervention. : Yes ? ? ?I attest that I was present, lead the team conference, and concur with the assessment and plan of the team. ? ? ?Dorthula Nettles G ?02/17/2022, 1:07 PM  ? ? ? ? ? ? ?

## 2022-02-17 NOTE — Progress Notes (Signed)
Palliative: ? 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 He was admitted on 02/03/22 for work up and MRI brain done revealing significant decrease in size of enhancing lesions (in pons, bilateral temporal lobes, left periventricular white matter and left frontal lobe). Decadron increased to 2 mg IV BID. He was placed on CIWA protocol due to hx of daily ETOH (down from 49 drinks to 3 drinks/day?). CT abdomen/pelvis done revealing new 5-10% left PTX, new acute/subacute right 9-10 rib FX, right foraminal impingement at L5/S1, stable appearance of stable left lung nodule and sclerotic metastatic disease involving thoracic spine and proximal femurs. Dr. Earlie Server consulted and recommended maintaining patient on 2 mg bid as he was feeling better. He continues to have RLE weakness with tendency to buckle, needs increased time to process and follow one step commands, has orthostatic changes with standing attempts as well as  right lateral and posterior lean. CIR recommended due to functional decline. Palliative care has been asked to get involved due to lack of progress at CIR.  ?  ? ?I met today with [name] but no family/visitors at bedside. He is able to acknowledge understanding of his condition. He shares that the doctors have explained and answered all his questions. He continues with good appetite but continues to be extremely weak and not progressing with therapy. We discuss path forward and he quickly expresses that he desires to return home. We discussed type of care at home and we discussed hospice. He asks me if this will be making him work and do more therapy and I explained that hospice type care focuses on support and symptom  management. He replies "good." He is open to hospice support at home. We discussed further and he seems to be mostly concerned with returning home where he can enjoy time with his family.  ? ?I met again later with [name] along with his wife, [name], at bedside. We discussed plan moving forward and they confirm plan to return home. They have minimal support but [name] is not working and available to be [employer] caregiver. Leona's main concern is ensuring that she will have support to learn how to use equipment at home and for this reason is thinking that they would best benefit from home health PT/OT. We discussed home health services vs hospice at home. [name] also expresses concern for hospice use of morphine. I explained the use of morphine for pain and shortness of breath when needed but Ara has no need for this at this time. This would be an option for them when needed but not needed at this time. Also having [name] at home gives them more control over his medications and management. At this time they are unsure about home health vs hospice. We also discussed beginning with home health services and utilizing hospice after these services have reached their benefits.  ? ?I also brought up code status and desire for resuscitation efforts. Decklin shares with me that this is difficult to answer since they are not in this situation yet. I explained that I understand but this decision needs to be made before these events occur because at that time it is an emergency and no time for discussion. I encouraged that it  is okay not to have a clear decision at this time but it is important to discuss in the very near future. They express that their current goal is to figure out what is needed to get home and that they will think more about code status wishes after they return home.  ? ?All questions/concerns addressed. Emotional support provided.  ? ?Exam: Alert, oriented but forgetful at times. No distress. Breathing regular,  unlabored. Abd soft. Generalized weakness.  ? ?Plan:  ?- Return home with home health/palliative vs hospice.  ? ?60 min ? ?Vinie Sill, NP ?Palliative Medicine Team ?Pager [phone] (Please see [email or web address] for schedule) ?Team Phone [phone]  ? ? ?Greater than 50%  of this time was spent counseling and coordinating care related to the above assessment and plan   ?

## 2022-02-17 NOTE — Progress Notes (Signed)
?                                                       PROGRESS NOTE ? ? ?Subjective/Complaints: ?Pt had a fair night. Anxious about what's happening to him. Asked me how long he had to live today. Asked me if there was "any chance" he could make it through this. Recalls and understood contents of conversations yesterday with Dr. Mickeal Skinner and palliative care ? ?ROS: Limited due to cognitive/behavioral  ? ? ?Objective: ?  ?MR THORACIC SPINE W WO CONTRAST ? 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 Chronic low back pain EXAM: MRI THORACIC AND LUMBAR SPINE WITHOUT AND WITH CONTRAST TECHNIQUE: Multiplanar and multiecho pulse sequences of the thoracic and lumbar spine were obtained without and with intravenous contrast. CONTRAST:  71mL GADAVIST GADOBUTROL 1 MMOL/ML IV SOLN COMPARISON:  CT 02/04/2022 fall FINDINGS: MRI THORACIC SPINE FINDINGS Alignment:  Physiologic. Vertebrae: 1.5 cm enhancing bone lesion within the anterior aspect of the T7 vertebral body. Additional smaller enhancing lesions noted within the posterior elements of the T5, T6, and T7 levels including lesion centered at the right T7 pedicle. Additional marrow replacing lesion is seen within the proximal sternum (series 3, image 8). No pathologic fracture. No evidence of discitis. Cord: Large enhancing intramedullary lesion within the lower thoracic cord at the T9 and T10 level extending 4.4 cm in length (series 17, image 8). Lesion is minimally expansile. Syrinx above and below the lesion extending cranially to the T7-8 disc level and caudally to the level of the T11 inferior endplate (series 4, image 9). No additional lesions are seen within the thoracic cord. No abnormal extramedullary enhancement. Paraspinal and other soft tissues: Post treatment changes within the visualized left lung, not well assessed. Disc levels: No focal disc protrusion. No foraminal or canal stenosis at any level. MRI LUMBAR SPINE  FINDINGS Segmentation:  Standard. Alignment:  Physiologic. Vertebrae: No marrow replacing bone lesions are seen within the lumbar spine or proximal sacrum. No evidence of discitis. No fracture. Conus medullaris: Extends to the L1 level and appears normal. No mass lesion or abnormal postcontrast enhancement. Paraspinal and other soft tissues: None. Disc levels: T12-L1 through L3-L4: Unremarkable. L4-L5: Diffuse disc bulge with left foraminal/extraforaminal protrusion. Mild bilateral facet arthropathy. Mild mass effect upon the exited left L4 nerve root. No foraminal or canal stenosis. L5-S1: Disc height loss with diffuse disc osteophyte complex. Mild bilateral facet arthropathy. Mild-moderate right foraminal stenosis. No canal stenosis. IMPRESSION: 1. Large enhancing intramedullary lesion within the lower thoracic cord at the T9 and T10 level extending 4.4 cm in length with surrounding syrinx. Findings are most compatible with metastatic disease given history of malignancy. 2. Osseous metastatic lesions within the T7 vertebral body and posterior elements of the T5, T6, and T7 levels as well as within the proximal sternum. 3. Lumbar spondylosis with mild-moderate right foraminal stenosis at L5-S1. At L4-L5, there is a left foraminal/extraforaminal protrusion resulting in mild mass effect upon the exited left L4 nerve root. 4. No foraminal or canal stenosis at any level within the thoracic spine. Electronically Signed   By: Davina Poke D.O.   On: 02/15/2022 16:23  ? ?MR Lumbar Spine W Wo Contrast ? ?Result Date: 02/15/2022 ?CLINICAL DATA:  Non-small cell lung cancer with brain  metastases. Chronic low back pain EXAM: MRI THORACIC AND LUMBAR SPINE WITHOUT AND WITH CONTRAST TECHNIQUE: Multiplanar and multiecho pulse sequences of the thoracic and lumbar spine were obtained without and with intravenous contrast. CONTRAST:  68mL GADAVIST GADOBUTROL 1 MMOL/ML IV SOLN COMPARISON:  CT 02/04/2022 fall FINDINGS: MRI THORACIC  SPINE FINDINGS Alignment:  Physiologic. Vertebrae: 1.5 cm enhancing bone lesion within the anterior aspect of the T7 vertebral body. Additional smaller enhancing lesions noted within the posterior elements of the T5, T6, and T7 levels including lesion centered at the right T7 pedicle. Additional marrow replacing lesion is seen within the proximal sternum (series 3, image 8). No pathologic fracture. No evidence of discitis. Cord: Large enhancing intramedullary lesion within the lower thoracic cord at the T9 and T10 level extending 4.4 cm in length (series 17, image 8). Lesion is minimally expansile. Syrinx above and below the lesion extending cranially to the T7-8 disc level and caudally to the level of the T11 inferior endplate (series 4, image 9). No additional lesions are seen within the thoracic cord. No abnormal extramedullary enhancement. Paraspinal and other soft tissues: Post treatment changes within the visualized left lung, not well assessed. Disc levels: No focal disc protrusion. No foraminal or canal stenosis at any level. MRI LUMBAR SPINE FINDINGS Segmentation:  Standard. Alignment:  Physiologic. Vertebrae: No marrow replacing bone lesions are seen within the lumbar spine or proximal sacrum. No evidence of discitis. No fracture. Conus medullaris: Extends to the L1 level and appears normal. No mass lesion or abnormal postcontrast enhancement. Paraspinal and other soft tissues: None. Disc levels: T12-L1 through L3-L4: Unremarkable. L4-L5: Diffuse disc bulge with left foraminal/extraforaminal protrusion. Mild bilateral facet arthropathy. Mild mass effect upon the exited left L4 nerve root. No foraminal or canal stenosis. L5-S1: Disc height loss with diffuse disc osteophyte complex. Mild bilateral facet arthropathy. Mild-moderate right foraminal stenosis. No canal stenosis. IMPRESSION: 1. Large enhancing intramedullary lesion within the lower thoracic cord at the T9 and T10 level extending 4.4 cm in length  with surrounding syrinx. Findings are most compatible with metastatic disease given history of malignancy. 2. Osseous metastatic lesions within the T7 vertebral body and posterior elements of the T5, T6, and T7 levels as well as within the proximal sternum. 3. Lumbar spondylosis with mild-moderate right foraminal stenosis at L5-S1. At L4-L5, there is a left foraminal/extraforaminal protrusion resulting in mild mass effect upon the exited left L4 nerve root. 4. No foraminal or canal stenosis at any level within the thoracic spine. Electronically Signed   By: Davina Poke D.O.   On: 02/15/2022 16:23  ? ?DG Abd Portable 1V ? ?Result Date: 02/16/2022 ?CLINICAL DATA:  Constipation EXAM: PORTABLE ABDOMEN - 1 VIEW COMPARISON:  CT done on 02/04/2022 FINDINGS: Bowel gas pattern is nonspecific. Stomach is moderately distended. There is no significant small bowel dilation. Gas and stool are present in colon. No abnormal masses or calcifications are seen. Kidneys are partly obscured by bowel contents. There are numerous metallic densities in the prostate suggesting previous brachytherapy. Degenerative changes are noted in the lower lumbar spine. IMPRESSION: No significant radiographic abnormality is seen in the abdomen. Electronically Signed   By: Elmer Picker M.D.   On: 02/16/2022 18:40   ?Recent Labs  ?  02/16/22 ?0403  ?WBC 8.4  ?HGB 10.3*  ?HCT 30.4*  ?PLT 231  ? ? ?Recent Labs  ?  02/16/22 ?0403  ?NA 137  ?K 4.1  ?CL 102  ?CO2 27  ?GLUCOSE 112*  ?BUN 41*  ?  CREATININE 0.77  ?CALCIUM 9.4  ? ? ? ?Intake/Output Summary (Last 24 hours) at 02/17/2022 1129 ?Last data filed at 02/17/2022 0700 ?Gross per 24 hour  ?Intake 1366.58 ml  ?Output 625 ml  ?Net 741.58 ml  ?  ? ?  ? ?Physical Exam: ?Vital Signs ?Blood pressure (!) 123/98, pulse 77, temperature 97.8 ?F (36.6 ?C), temperature source Oral, resp. rate 15, height 6' (1.829 m), weight 64.7 kg, SpO2 95 %. ? ?Constitutional: No distress . Vital signs reviewed. ?HEENT: NCAT,  EOMI, oral membranes moist ?Neck: supple ?Cardiovascular: RRR without murmur. No JVD    ?Respiratory/Chest: CTA Bilaterally without wheezes or rales. Normal effort    ?GI/Abdomen: BS +, non-tender, non

## 2022-02-17 NOTE — Progress Notes (Signed)
Patient unable to tolerate standing orthostatic vitals signs, very anxious and nervous.   ?

## 2022-02-17 NOTE — Progress Notes (Signed)
Patient ID: Richard Santos, male   DOB: September 17, 1961, 61 y.o.   MRN: [id number] ? ?SW met with pt and pt wife in room to provide updates from team conference, and d/c date remains 3/31. SW discussed plan at discharge given current physical limitations. Plans remain to go home with home health and therapies. SW reiterated that there will not be any additional supports. Wife reports their dtr from [place] is coming up to visit,and his sister lives locally and can provide some assistance if needed. SW confirms continued family edu tomorrow.  ? ?[name], MSW, LCSWA ?Office: [phone] ?Cell: [phone] ?Fax: [phone]  ?

## 2022-02-17 NOTE — Progress Notes (Signed)
Occupational Therapy Session Note ? ?Patient Details  ?Name: Richard Santos ?MRN: [id number] ?Date of Birth: Jun 10, 1961 ? ?Today's Date: 02/17/2022 ?OT Individual Time: 2130-8657 ?OT Individual Time Calculation (min): 45 min  ? ?Today's Date: 02/17/2022 ?OT Individual Time: 8469-6295 ?OT Individual Time Calculation (min): 38 min  ? ?Short Term Goals: ?Week 1:  OT Short Term Goal 1 (Week 1): Pt will complete toilet transfer with min A ?OT Short Term Goal 1 - Progress (Week 1): Not met ?OT Short Term Goal 2 (Week 1): Pt will complete toileting task with mod A ?OT Short Term Goal 2 - Progress (Week 1): Not met ?OT Short Term Goal 3 (Week 1): Pt will engage in dynamic standing balance task without locking knees for 1 min ?OT Short Term Goal 3 - Progress (Week 1): Not met ?OT Short Term Goal 4 (Week 1): Pt will complete LB dressing with mod A ?OT Short Term Goal 4 - Progress (Week 1): Not met ? ?Skilled Therapeutic Interventions/Progress Updates:  ?   ?Pt received in bed with no pain reported  ?Therapeutic activity ?Dynamic discussion for care expected at home DC equipment, rolling, management of Les, review of LB ADLs/toileting and bed level. Pt reporting all he wants to do at home "sit on my porc, drink a beer and smoke a cigarette." OT discusses to talk to medical team to know safety with mixing medicaiton and alcohol/cigarettes, but focus of life should be on making pt happy and decreasing BOC on wife.  ? ?Pt left at end of session in bed with exit alarm on, call light in reach and all needs met ? ?Session 2: ? ?Pt received in bed with no pain and wife and RN present. Focus of session on family ed, review of recommended equipement and discussion with family on BOC. In general wife seemed very overwhelmed and hesitant to move pt. Pt and wife both expressing concern with using hoyer lift, but not quite understanding the level of assist and impact of poor trunk control/paralysis of Les onto transfers. OT reviewed  recommendation for toileting, bathing and dressing completed at bed level. OT demo and has wife hands on practice with brakes on hoyer lift, pumping up hydrolic lift as well as releasing lift/lowering. At this point wife looks and verbalized being overwhelmed. OT validates that this is a lot and hands on practice would be necessary many times to demo safety with lift. OT also recommends videotaping sequence/steps for wife to review at home. Pt, wife and OT roll with wife helping with 1 side and MOD A for LE management and A to place sling underneath pt. CSW present and requesting time to discuss with family. ?Pt left at end of session in bed with exit alarm on, call light in reach and all needs met. Pt missed 22 min skilled OT d/t CSW meeting.  ? ? ?Therapy Documentation ?Precautions:  ?Precautions ?Precautions: Fall ?Precaution Comments: truncal and BLE limb incoordination; absent R LE sensation with R knee buckle ?Restrictions ?Weight Bearing Restrictions: No ?General: ?  ? ?Therapy/Group: Individual Therapy ? ?Lowella Dell Jaleeya Mcnelly ?02/17/2022, 6:54 AM ?

## 2022-02-18 DIAGNOSIS — R5381 Other malaise: Secondary | ICD-10-CM | POA: Diagnosis not present

## 2022-02-18 DIAGNOSIS — C7931 Secondary malignant neoplasm of brain: Secondary | ICD-10-CM | POA: Diagnosis not present

## 2022-02-18 DIAGNOSIS — R339 Retention of urine, unspecified: Secondary | ICD-10-CM | POA: Diagnosis not present

## 2022-02-18 DIAGNOSIS — I951 Orthostatic hypotension: Secondary | ICD-10-CM | POA: Diagnosis not present

## 2022-02-18 LAB — BASIC METABOLIC PANEL
Anion gap: 7 (ref 5–15)
BUN: 22 mg/dL — ABNORMAL HIGH (ref 6–20)
CO2: 25 mmol/L (ref 22–32)
Calcium: 8.5 mg/dL — ABNORMAL LOW (ref 8.9–10.3)
Chloride: 101 mmol/L (ref 98–111)
Creatinine, Ser: 0.59 mg/dL — ABNORMAL LOW (ref 0.61–1.24)
GFR, Estimated: >60 mL/min (ref >60)
Glucose, Bld: 103 mg/dL — ABNORMAL HIGH (ref 70–99)
Potassium: 3.7 mmol/L (ref 3.5–5.1)
Sodium: 133 mmol/L — ABNORMAL LOW (ref 135–145)

## 2022-02-18 NOTE — Progress Notes (Signed)
?                                                       PROGRESS NOTE ? ? ?Subjective/Complaints: ?Pt still c/o abdominal pain. Had small bm yesterday with dig stim. Denies back pain.  ? ?ROS: Limited due to cognitive/behavioral  ? ? ?Objective: ?  ?DG Abd Portable 1V ? ?Result Date: 02/16/2022 ?CLINICAL DATA:  Constipation EXAM: PORTABLE ABDOMEN - 1 VIEW COMPARISON:  CT done on 02/04/2022 FINDINGS: Bowel gas pattern is nonspecific. Stomach is moderately distended. There is no significant small bowel dilation. Gas and stool are present in colon. No abnormal masses or calcifications are seen. Kidneys are partly obscured by bowel contents. There are numerous metallic densities in the prostate suggesting previous brachytherapy. Degenerative changes are noted in the lower lumbar spine. IMPRESSION: No significant radiographic abnormality is seen in the abdomen. Electronically Signed   By: Elmer Picker M.D.   On: 02/16/2022 18:40   ?Recent Labs  ?  02/16/22 ?0403  ?WBC 8.4  ?HGB 10.3*  ?HCT 30.4*  ?PLT 231  ? ? ?Recent Labs  ?  02/16/22 ?0403 02/18/22 ?0401  ?NA 137 133*  ?K 4.1 3.7  ?CL 102 101  ?CO2 27 25  ?GLUCOSE 112* 103*  ?BUN 41* 22*  ?CREATININE 0.77 0.59*  ?CALCIUM 9.4 8.5*  ? ? ? ?Intake/Output Summary (Last 24 hours) at 02/18/2022 0831 ?Last data filed at 02/18/2022 0803 ?Gross per 24 hour  ?Intake 620 ml  ?Output 1675 ml  ?Net -1055 ml  ?  ? ?  ? ?Physical Exam: ?Vital Signs ?Blood pressure 119/74, pulse 73, temperature 98 ?F (36.7 ?C), temperature source Oral, resp. rate 14, height 6' (1.829 m), weight 64.7 kg, SpO2 95 %. ? ?Constitutional: No distress . Vital signs reviewed. ?HEENT: NCAT, EOMI, oral membranes moist ?Neck: supple ?Cardiovascular: RRR without murmur. No JVD    ?Respiratory/Chest: CTA Bilaterally without wheezes or rales. Normal effort    ?GI/Abdomen: BS +, non-tender, non-distended ?Ext: no clubbing, cyanosis, or edema ?Psych: flat but cooperative.  ?Musculoskeletal:  ?   Cervical back:  Neck supple. No back tenderness.  ? ?Skin: ?   General: Skin is warm and dry.  ?   Port that's accessed in R chest;--stable appearing ?  ?Neurological:  ?   Mental Status:alert, still some delays in processing. Fair awareness. Mild right facial weakness. Occasional intentional tremor RUE.  Now has BLE sensory deficits for LT/pain/proprioception, ~T8-10 sensory level. Unable to move either leg. UE 4/5. No sensory findings in UE's. --neuro exam unchanged in LE's ? ?Assessment/Plan: ?1. Functional deficits which require 3+ hours per day of interdisciplinary therapy in a comprehensive inpatient rehab setting. ?Physiatrist is providing close team supervision and 24 hour management of active medical problems listed below. ?Physiatrist and rehab team continue to assess barriers to discharge/monitor patient progress toward functional and medical goals ? ?Care Tool: ? ?Bathing ?   ?Body parts bathed by patient: Right arm, Left arm, Chest, Front perineal area, Abdomen, Buttocks, Right upper leg, Left upper leg, Face  ? Body parts bathed by helper: Left lower leg, Right lower leg ?  ?  ?Bathing assist Assist Level: Moderate Assistance - Patient 50 - 74% ?  ?  ?Upper Body Dressing/Undressing ?Upper body dressing   ?What is the patient wearing?: Hospital  gown only ?   ?Upper body assist Assist Level: Minimal Assistance - Patient > 75% ?   ?Lower Body Dressing/Undressing ?Lower body dressing ? ? ?   ?What is the patient wearing?: Pants ? ?  ? ?Lower body assist Assist for lower body dressing: Maximal Assistance - Patient 25 - 49% ?   ? ?Toileting ?Toileting Toileting Activity did not occur (Probation officer and hygiene only): N/A (no void or bm)  ?Toileting assist Assist for toileting: Total Assistance - Patient < 25% ?  ?  ?Transfers ?Chair/bed transfer ? ?Transfers assist ? Chair/bed transfer activity did not occur: Safety/medical concerns ? ?Chair/bed transfer assist level: 2 Helpers ?  ?   ?Locomotion ?Ambulation ? ? ?Ambulation assist ? ? Ambulation activity did not occur: Safety/medical concerns (requires use of AD for safe attempt at ambulation with skilled assistance) ? ?  ?  ?   ? ?Walk 10 feet activity ? ? ?Assist ? Walk 10 feet activity did not occur: Safety/medical concerns ? ?  ?   ? ?Walk 50 feet activity ? ? ?Assist Walk 50 feet with 2 turns activity did not occur: Safety/medical concerns ? ?  ?   ? ? ?Walk 150 feet activity ? ? ?Assist Walk 150 feet activity did not occur: Safety/medical concerns ? ?  ?  ?  ? ?Walk 10 feet on uneven surface  ?activity ? ? ?Assist Walk 10 feet on uneven surfaces activity did not occur: Safety/medical concerns ? ? ?  ?   ? ?Wheelchair ? ? ? ? ?Assist Is the patient using a wheelchair?: Yes ?Type of Wheelchair: Manual ?  ? ?Wheelchair assist level: Dependent - Patient 0% ?   ? ? ?Wheelchair 50 feet with 2 turns activity ? ? ? ?Assist ? ?  ?  ? ? ?Assist Level: Dependent - Patient 0%  ? ?Wheelchair 150 feet activity  ? ? ? ?Assist ?   ? ? ?Assist Level: Dependent - Patient 0%  ? ?Blood pressure 119/74, pulse 73, temperature 98 ?F (36.7 ?C), temperature source Oral, resp. rate 14, height 6' (1.829 m), weight 64.7 kg, SpO2 95 %. ? ?Medical Problem List and Plan: ?1. Functional deficits secondary to debility from metastatic Cancer ?            -patient may not shower- due to R chest port ?            -ELOS/Goals: 02/20/22- min A to supervision ? -Continue CIR therapies including PT, OT, and SLP  Team is shifting focus of rehab to education/compensatory strategies/equipment. ?  - continue decadron 4mg  q8   ?  -MRI demonstrates syrinx and large lesion within cord at T10 ?-palliative care and oncology discussed scenario with pt 3/27 ?-now working on hospice. Concern of rehab team is that wife will be unable to manage him at home with hospice. Might be better for him to go to hospice facility.  ?2.  Antithrombotics: ?-DVT/anticoagulation:  Pharmaceutical: Lovenox ?             -antiplatelet therapy: Low dose ASA.  ?3. Pain Management: Continue Tylenol prn.  ?4. Mood: LCSW to follow for evaluation and support.  ?            -antipsychotic agents: N/A ?5. Neuropsych: This patient may not be fully  capable of making decisions on his own behalf. SLUMs 11/30 ?6. Skin/Wound Care: routine pressure relief measures.  ?7. Fluids/Electrolytes/Nutrition:   ?-BUN improved 3/29---continue to push fluids ?8.  Recurrent  brain mets: On Depakote for seizure prophylaxis ?            --continue decadron 4 mg TID ? -  ?9. Orthostatic hypotension: Continue to check orthostatic vitals.  ?            --TEDs and abdominal binder when out of bed. ?            --encourage fluid intake. ? Dc'ed orthostatic checks ?10. Lung Cancer/COPD: Continue Dulera with incurse.  ?11. Suboptimal  Vitamin B12: Level-218-->received IV doses X 3-->po route daily.  ?12. Hypomagnesemia:supplemented ?13. Urinary retention: did not tolerate urecholine ? -foley replaced, pt feels better, continue ? -UA neg, UCX neg also ?14. Pancytopenia:  .  ?   -labs stable to improved 3/27, monitor weekly.  ?15. Constipation:   ?  -still c/o abdominal pain ? -had small results with dig stim yesterday ? -will try SSE today ?  ? ?LOS: ?12 days ?A FACE TO FACE EVALUATION WAS PERFORMED ? ?Meredith Staggers ?02/18/2022, 8:31 AM  ? ? ? ? ?

## 2022-02-18 NOTE — Progress Notes (Signed)
Patient was dig stimulated, noted to have soft stool in rectum. Small bowel movement achieved  ?

## 2022-02-18 NOTE — Progress Notes (Addendum)
Palliative: ? 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 He was admitted on 02/03/22 for work up and MRI brain done revealing significant decrease in size of enhancing lesions (in pons, bilateral temporal lobes, left periventricular white matter and left frontal lobe). Decadron increased to 2 mg IV BID. He was placed on CIWA protocol due to hx of daily ETOH (down from 49 drinks to 3 drinks/day?). CT abdomen/pelvis done revealing new 5-10% left PTX, new acute/subacute right 9-10 rib FX, right foraminal impingement at L5/S1, stable appearance of stable left lung nodule and sclerotic metastatic disease involving thoracic spine and proximal femurs. Dr. Earlie Server consulted and recommended maintaining patient on 2 mg bid as he was feeling better. He continues to have RLE weakness with tendency to buckle, needs increased time to process and follow one step commands, has orthostatic changes with standing attempts as well as  right lateral and posterior lean. CIR recommended due to functional decline. Palliative care has been asked to get involved due to lack of progress at CIR.  ? ?I met today with Richard Santos at bedside. We picked up conversation from yesterday regarding plans moving forward. Richard Santos reiterated the importance of returning to his home and Richard Santos wants to support this decision but is also nervous/overwhelmed with the idea of caring for him at home. We discussed needing maximum support at home and again discussed home health vs hospice support and the differences. Richard Santos was able to acknowledge today that therapy is not very beneficial to him at this stage and they were able to make the decision to pursue hospice support at home. We  discussed hospice support with providing equipment (although not the tilt wheelchair which they plan to rent), nurse visits, bath aides, and a safety net of 24/7 on call access to nursing support for any questions/concerns/changes. We discussed hospice agency and they are in between [facility] and [facility] and Richard Santos expresses that he wishes to work with [facility]. They continue to express the importance of having education and assistance to help them learn how to utilize hoyer lift so he can get in wheelchair and go outside if desired. They are working on having a temporary ramp placed to assist with this goal. Richard Santos reiterates again today that he is not interested in pursuing radiation therapy.  ? ?I updated Richard Santos, CSW and medical team. I discussed with hospice liaison that they will require equipment and specifically a hoyer lift and they will need front loaded support to assist with transition home. They remain full code and would like to revisit this decision after they are able to get back home. They would like to focus on the transition home at this stage.  ? ?All questions/concerns addressed. Emotional support provided.  ? ?Exam: Alert, oriented mostly (some confusion/forgetfulness at times). Breathing regular, unlabored. Abd tender/constipated. Generalized weakness and fatigue.  ? ?Plan: ?- Home with hospice. Will need max equipment and hoyer lift. He is not yet eligible for hospice facility given good appetite/intake with prognosis anticipated longer than 2 weeks (prognosis weeks to months). Discussed with them today option for transition from home to hospice facility when indicated/needed.  ?- Will need ongoing discussions regarding code status  and planning for the future.  ? ?40 min ? ?Richard Sill, NP ?Palliative Medicine Team ?Pager [phone] (Please see [email or web address] for schedule) ?Team Phone [phone]  ? ? ?Greater than 50%  of this time was spent  counseling and coordinating care related to the above assessment and plan   ?

## 2022-02-18 NOTE — Progress Notes (Signed)
Physical Therapy Session Note ? ?Patient Details  ?Name: Richard Santos ?MRN: [id number] ?Date of Birth: [birth year] ? ?Today's Date: 02/18/2022 ?PT Individual Time: 9518-8416 and 1415-1530 ?PT Individual Time Calculation (min): 45 min and 75 min  ? ?Short Term Goals: ?Week 2:  PT Short Term Goal 1 (Week 2): STG=LTG due to ELOS. ? ?Skilled Therapeutic Interventions/Progress Updates:  ?   ?Session 1: ?Patient in bed upon PT arrival. Patient alert and agreeable to PT session. Patient reported 6/10 abdominal pain during session, RN made aware. PT provided repositioning, rest breaks, and distraction as pain interventions throughout session.  ? ?Patient was incontinent of bowl at beginning of session. Appears to be small overflow as rectum appeared full during peri-care, RN made aware with plans for bowl program today.  ? ?Therapeutic Activity: ?Therapeutic Activity: ?Performed rolling R/L with mod-max A overall, total A for lower extremity management. Provided cues for use of hospital bed features for trunk management for reduced assist with bed mobility. Reinforced education on pressure relief with rolling and change in position in the bed. Patient with smear BM noted, changed incontinence brief and performed peri-care with total A during rolling. ? ?Patient set-up for oral care and washing his face witting up in the bed. Patient performed these with set-up assist alone. Patient reported feeling better after.  ?  ?Therapeutic Exercise: ?Patient performed the following exercises with verbal and tactile cues for proper technique. ?Exercises: ?-B DF stretch 2x1 min ?-B hip/knee flexion/extension x5 ?-B hamstring stretch 2x1 min ?-B hip IR/ER x5 ? ?Informed patient of Corene Cornea, ATP form [employer] providing loaner TIS w/c at d/c to assist with meeting patient's goals at d/c. Informed patient that family education this afternoon would be focused on hands on training with his wife for use of the [facility] lift for patient to be  able to sit OOB in the TIS w/c, patient in agreement and endorses that this is an important goal for him at home.  ? ?Patient in bed at end of session with breaks locked, bed alarm set, and all needs within reach.  ? ?Session 2: ?Patient in bed with his wife at bedside upon PT arrival. Patient alert and agreeable to PT session. Patient denied pain during session. ? ?Discussed d/c planning and patient's wife reports that the plan is for the patient to go home with Hospice services. Informed patient's wife about loaner TIS w/c to be provided by [facility] medical. Focused session on [place] training for management of transfers to TIS w/c at d/c. ? ?Patient's wife performed mod-max assist with rolling patient, placement/removal of the sling, and management of Hoyer lift and TIS w/c for dependent bed<>TIS w/c transfers with mod-max coaching and minimal assist throughout. Provided written handout on use of Hoyer lift and transfer safety. Patient's wife in agreement to return tomorrow afternoon for continued hands-on training with the lift. ? ?Patient in bed with his wife at bedside at end of session with breaks locked, bed alarm set, and all needs within reach.  ? ?Therapy Documentation ?Precautions:  ?Precautions ?Precautions: Fall ?Precaution Comments: truncal and BLE limb incoordination; absent R LE sensation with R knee buckle ?Restrictions ?Weight Bearing Restrictions: No ?General: ?PT Amount of Missed Time (min): 15 Minutes ?PT Missed Treatment Reason: Patient fatigue ? ? ? ?Therapy/Group: Individual Therapy ? ?Doreene Burke PT, DPT ? ?02/18/2022, 8:41 PM  ?

## 2022-02-18 NOTE — Progress Notes (Signed)
Physical Therapy Session Note ? ?Patient Details  ?Name: Richard Santos ?MRN: [id number] ?Date of Birth: [birth year] ? ?Today's Date: 02/18/2022 ?PT Individual Time: 7414-2395 ?PT Individual Time Calculation (min): 15 min  ? ?Short Term Goals: ?Week 2:  PT Short Term Goal 1 (Week 2): STG=LTG due to ELOS. ? ?Skilled Therapeutic Interventions/Progress Updates:  ?   ?Patient in bed with his wife at bedside. PT returned to follow-up with patient and his wife following meeting with Palliative Care nurse. Patient's wife is very interested in [facility] therapies, as she feels uncertain about handling equipment at home. PT encouraged her to initiate hands on training with equipment during tomorrow's therapy sessions and educated on the care support benefits of Hospice to reduce caregiver burden and improve patient's comfort and quality of care. Also discussed limitations of w/c options available with hospice care, per Palliative team, and increased likelihood of remaining bed level. Patient and his wife expressed that a safe w/c that the patient could go outside in would improve the patient's  quality of life and help him meet his personal goals of being able to go out on the back porch to sit with his family. Stated they would be open to rental options, will explore TIS w/c options for safe seating and position due to patient's limited trunk control secondary to paraplegia in order to assist patient with meeting his personal care goals. Will continue family education tomorrow afternoon with patient's wife. Patient in bed with his wife at bedside with all needs in reach and bed alarm set at end of session.  ? ?Therapy Documentation ?Precautions:  ?Precautions ?Precautions: Fall ?Precaution Comments: truncal and BLE limb incoordination; absent R LE sensation with R knee buckle ?Restrictions ?Weight Bearing Restrictions: No ? ? ? ?Therapy/Group: Individual Therapy ? ?Doreene Burke PT, DPT ? ?02/17/2022, 4:15 PM  ?

## 2022-02-18 NOTE — Progress Notes (Signed)
Manufacturing engineer [facility]) Hospital Liaison Note ? ?Referral received from [place] for patient/family interest in home with hospice. Chart under review by [facility] physician.  ? ?Hospice eligibility pending.  ? ?[place] liaison will complete bedside assessment tomorrow.  ? ?Please call with any questions or concerns. Thank you ? ?Roselee Nova, LCSW ?[place] [facility] Liaison ?[phone] ?

## 2022-02-18 NOTE — Progress Notes (Signed)
Occupational Therapy Session Note ? ?Patient Details  ?Name: Richard Santos ?MRN: [id number] ?Date of Birth: 01-05-1961 ? ?Today's Date: 02/18/2022 ?OT Individual Time: 1320-1400 ?OT Individual Time Calculation (min): 40 min  ? ? ?Short Term Goals: ?Week 1:  OT Short Term Goal 1 (Week 1): Pt will complete toilet transfer with min A ?OT Short Term Goal 1 - Progress (Week 1): Not met ?OT Short Term Goal 2 (Week 1): Pt will complete toileting task with mod A ?OT Short Term Goal 2 - Progress (Week 1): Not met ?OT Short Term Goal 3 (Week 1): Pt will engage in dynamic standing balance task without locking knees for 1 min ?OT Short Term Goal 3 - Progress (Week 1): Not met ?OT Short Term Goal 4 (Week 1): Pt will complete LB dressing with mod A ?OT Short Term Goal 4 - Progress (Week 1): Not met ? ?Skilled Therapeutic Interventions/Progress Updates:  ?   ?Pt received in bed with wife and palliative care coordinator present. Pt missed 20 time d/t palliative meeting.  ?Family Ed: ?Focus of session on BADL at bed level. Time spent writing up handout while palliative finishing discussion. Wife participates in bed level dressing with mod cuing for coaching on rolling/hospital ben strategies, use of chuck pad and improved body mechanics. Pt is able ot don shirt with MIN A, roll with MIN A and pants with MAX A. Pt wife able to help physically but once again looked overwhelmed at end of session therefore provided written instructions below. ? ?Bed level bathing/toileting strategies: ? ?If you can, raise the bed to your height to decrease bending to save your back.  ?Have Richard Santos help wash his top half, thighs and in between his legs seated with the head of the bed elevated  ?You can help wash his feet and calves while sitting, then roll onto his side to wash his buttocks.  ?To roll: help him bend his knee and reach with the same arm across to the bed rail and pull into his side ?Bend his R knee and reach with R arm to roll left.  ?If you  have a pad underneath him you can pull it to help roll him more onto his side ?Always be mindful of the catheter bag! ?To get dressed Richard Santos can do the arms/head of the shirt, but may need help to pull down the back- you can do this when rolling to pull the pants up too, or he can use both bed rails to pull forward off of the elevated HOB ?Toileting at bed level will be similar- roll both directions to pull his pants down past hips, on the second side, place the bed pan under his buttocks with the thin area near his butt. Roll off the bed pan to clean up. Sometimes it is best ot have towels/pad underneath to not get the sheets potentially dirty. We do not recommend getting on the toilet or BSC. He is incontinent and unable to tell if he goes, so check his buttocks to make sure it is clean whenever he rolls ?Sit him upright with the head of the bed will help facilitate a better bowel movement ?If he wants to get out of bed and into the chair use the hoyer lift ?Pressure relief: to decrease the risk of Richard Santos getting a sore on his sacrum every 30 min have him roll onto his side for 2 minutes each direction ? ?Pt left at end of session in bed with exit alarm on, call  light in reach and all needs met ? ? ?Therapy Documentation ?Precautions:  ?Precautions ?Precautions: Fall ?Precaution Comments: truncal and BLE limb incoordination; absent R LE sensation with R knee buckle ?Restrictions ?Weight Bearing Restrictions: No ?General: ?  ?Therapy/Group: Individual Therapy ? ?Richard Santos ?02/18/2022, 6:51 AM ?

## 2022-02-18 NOTE — Progress Notes (Addendum)
Patient ID: Richard Santos, male   DOB: June 14, 1961, 61 y.o.   MRN: [id number] ? ?After updates yesterday, NP-Richard Santos/Palliative recommended home with home health  therapies as pt and his wife were insistent on going home with therapy.  ? ?*Per NP-Richard Santos with Palliative Care pt is now amenable to hospice. SW will send referral to [facility]. ? ?6- SW left message for [facility] to discuss referral. SW waiting on follow-up. ? ?9622- SW returned phone call to [facility] to discuss referral. Reports she received updates from NP about referral. States pt will be reviewed. Likely to have an answer by tomorrow.  ? ?SW met with pt and pt wife during PT session to inform on above, and review d/c process once referral is accepted. Wife is aware SW will arrange ambulance transport and no concerns related to d/c date since we are waiting on approval. Pt wife reported pt now has Medicaid. ID #[id number] N . ? ?Richard Santos, MSW, LCSWA ?Office: [phone] ?Cell: [phone] ?Fax: [phone]  ?

## 2022-02-19 DIAGNOSIS — R339 Retention of urine, unspecified: Secondary | ICD-10-CM | POA: Diagnosis not present

## 2022-02-19 DIAGNOSIS — I951 Orthostatic hypotension: Secondary | ICD-10-CM | POA: Diagnosis not present

## 2022-02-19 DIAGNOSIS — C7931 Secondary malignant neoplasm of brain: Secondary | ICD-10-CM | POA: Diagnosis not present

## 2022-02-19 DIAGNOSIS — R5381 Other malaise: Secondary | ICD-10-CM | POA: Diagnosis not present

## 2022-02-19 MED ORDER — SENNOSIDES-DOCUSATE SODIUM 8.6-50 MG PO TABS
2.0000 | ORAL_TABLET | Freq: Two times a day (BID) | ORAL | Status: DC
  Administered 2022-02-19 – 2022-02-20 (×2): 2 via ORAL
  Filled 2022-02-19 (×2): qty 2

## 2022-02-19 NOTE — Progress Notes (Signed)
Occupational Therapy Discharge Summary ? ?Patient Details  ?Name: Richard Santos ?MRN: [id number] ?Date of Birth: 1961-06-22 ? ?Patient has met 3 of 6 long term goals. 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 Despite bring able to stand, transfer and ambulate on eval and complete BADLs at MOD A level, pt has experienced  paralysis in BLE and trunk along T7-8 distribution. Pt and wife have completed family training for bed level BADLs/toileting. The family will be supported by hospice services for follow up. ? ?Reasons goals not met: pt requires up to MOD A for rolling for LE management d/t energy conservation as well as total A for LB dressing ? ?Equipment: ?No equipment provided ? ?Reasons for discharge: treatment goals met and discharge from hospital ? ?Patient/family agrees with progress made and goals achieved: Yes ? ?OT Discharge ?Precautions/Restrictions  ?Restrictions ?Weight Bearing Restrictions: No ?Vision/Perception  ?Vision - History ?Ability to See in Adequate Light: 0 Adequate ?Perception ?Perception: Within Functional Limits ?Praxis ?Praxis: Impaired ?Praxis Impairment Details: Motor planning  ?Cognition ?Overall Cognitive Status: History of cognitive impairments - at baseline ?Arousal/Alertness: Awake/alert ?Orientation Level: Oriented to person;Oriented to place;Oriented to situation ?Focused Attention: Appears intact ?Sustained Attention: Impaired ?Sustained Attention Impairment: Verbal basic;Functional basic ?Selective Attention: Impaired ?Selective Attention Impairment: Verbal basic;Functional basic ?Memory: Impaired ?Memory Impairment: Decreased short term memory;Storage deficit;Retrieval deficit;Decreased recall of new information ?Decreased Short Term Memory: Verbal basic;Functional basic ?Awareness: Impaired ?Awareness Impairment: Emergent impairment ?Safety/Judgment: Appears intact ?Sensation ?Sensation ?Light Touch:  Impaired Detail ?Central sensation comments: complete loss of light touch and proprioceptive sense in B lower extremities ?Light Touch Impaired Details: Absent RLE;Absent LLE ?Hot/Cold: Impaired Detail ?Hot/Cold Impaired Details: Absent RLE;Absent LLE ?Proprioception: Impaired Detail ?Proprioception Impaired Details: Absent RLE;Absent LLE ?Coordination ?Gross Motor Movements are Fluid and Coordinated: No ?Fine Motor Movements are Fluid and Coordinated: Yes ?Coordination and Movement Description: limited by progressive paraplegia secondary to spinal mass ?Heel Shin Test: no volitional movement of B lower extremities ?Motor  ?Motor ?Motor: Paraplegia;Abnormal postural alignment and control;Abnormal tone ?Motor - Discharge Observations: Progressive paraplegia throughout admission, complete motor and sensory loss of B lower extremities  ?Mobility ?Bed Mobility ?Bed Mobility: Supine to Sit;Sit to Supine;Rolling Right;Rolling Left ?Rolling Right: Moderate Assistance - Patient 50-74%;Maximal Assistance - Patient 25-49% ?Rolling Left: Moderate Assistance - Patient 50-74%;Maximal Assistance - Patient 25-49% ?Supine to Sit: Maximal Assistance - Patient - Patient 25-49% ?Sit to Supine: Maximal Assistance - Patient 25-49% ?Transfers ?Transfers: Transfer via Geophysicist/field seismologist;Lateral/Scoot Transfers ?Stand Pivot Transfer Details:  (cuing used for squat pivot transfer) ?Lateral/Scoot Transfers: Total Assistance - Patient < 25% ?Transfer (Assistive device): Other (Comment) (slide board) ?Transfer via Lift Equipment:  Optician, dispensing (dependent)) ?Trunk/Postural Assessment  ?Cervical Assessment ?Cervical Assessment: Exceptions to [facility] (slight forward head) ?Thoracic Assessment ?Thoracic Assessment: Exceptions to [facility] (slight thoracic rounding) ?Lumbar Assessment ?Lumbar Assessment: Exceptions to [facility] (posterior pelvic tilt in sitting) ?Postural Control ?Postural Control: Deficits on evaluation ?Trunk Control: impaired requiring mod-max A for  sitting EOB ?Protective Responses: delayed and insufficient, posterior bias in sitting  ?Balance ?Static Sitting Balance ?Static Sitting - Balance Support: Feet supported;Bilateral upper extremity supported ?Static Sitting - Level of Assistance: 3: Mod assist ?Dynamic Sitting Balance ?Dynamic Sitting - Balance Support: Feet supported;Bilateral upper extremity supported ?Dynamic Sitting - Level of Assistance: 2: Max assist ?Dynamic Sitting - Balance Activities: Forward lean/weight shifting;Lateral lean/weight shifting;Reaching for objects;Reaching across midline ?Sitting balance - Comments: wt shifting, transfers, supported sitting in  wc activities performed w/wc in tilt due to inability to maintain upright without support. ?Static Standing Balance ?Static Standing - Comment/# of Minutes: unable due to decline in function ?Extremity Assessment  ?RLE Assessment ?RLE Assessment: Exceptions to [facility] ?Passive Range of Motion (PROM) Comments: WFLS ?Active Range of Motion (AROM) Comments: 0/5 ?General Strength Comments: 0/5 ?LLE Assessment ?Passive Range of Motion (PROM) Comments: WFLs ?Active Range of Motion (AROM) Comments: 0/5 ?General Strength Comments: 0/5 ?Extremity/Trunk Assessment ?  WFL ?  WFL ? ? ?Richard Santos ?02/19/2022, 1:43 PM ?

## 2022-02-19 NOTE — Progress Notes (Addendum)
Manufacturing engineer [facility]) Hospital Liaison Note ? ?Referral received for patient/family interest in home with hospice. [place] liaison attempted to confirm interest with patient's wife [name]. Interest confirmed ? ?Hospice eligibility confirmed.  ? ?Plan is to discharge home tomorrow via [place]. DME in the home: rolling walker and cane.  ? ?DME needs; Hospital bed and hoyer lift. [name] is the contact for DME delivery.  ? ?Please send patient home with comfort prescriptions/medications at discharge.  ? ?Please call with any questions or concerns. Thank you ? ?Roselee Nova, LCSW ?[place] [facility] Liaison ?[phone] ?

## 2022-02-19 NOTE — Progress Notes (Signed)
Physical Therapy Session Note ? ?Patient Details  ?Name: Richard Santos ?MRN: [id number] ?Date of Birth: [birth year] ? ?Today's Date: 02/19/2022 ?PT Individual Time: 2595-6387 ?PT Individual Time Calculation (min): 71 min  ? ?Short Term Goals: ?Week 1:  PT Short Term Goal 1 (Week 1): Pt will perform supine<>sit with CGA ?PT Short Term Goal 1 - Progress (Week 1): Not progressing ?PT Short Term Goal 2 (Week 1): Pt will perform sit<>stands using LRAD with min assist ?PT Short Term Goal 2 - Progress (Week 1): Not progressing ?PT Short Term Goal 3 (Week 1): Pt will perform bed<>chair transfers using LRAD with min assist ?PT Short Term Goal 3 - Progress (Week 1): Not progressing ?PT Short Term Goal 4 (Week 1): Pt will ambulate at least 27ft using LRAD with mod assist ?PT Short Term Goal 4 - Progress (Week 1): Not progressing ?PT Short Term Goal 5 (Week 1): Pt will initiate stair training ?PT Short Term Goal 5 - Progress (Week 1): Not progressing ?Week 2:  PT Short Term Goal 1 (Week 2): STG=LTG due to ELOS. ?Week 3:    ?Week 4:    ? ?Skilled Therapeutic Interventions/Progress Updates:  ?  Pt initially awake, alert, somulent. ?Therapist performed bilat LE PROM, stretching of HS and gastrocsoleus.  0/5 strength throughout [name] today. ? ?Rolled to L w/mod assist and heavy use of rail.  Side to sit w/cues and max assist. ?Pt very fearful of falling in sitting and overall max assist due to post lean, fades to mod assist w/Ues propped on therapist knees vs bed  ? ?Total assist slide board transfer to [place]. ?From wc level, pt able to brush teeth w/min assist/set up, heavy mod assist to wt shift and maintain balance to spit in sink. ?Tota assist to don shoes. ? ?PT transported to wc room to obtain L legrest which was not in room.  Legrest spent a few min in [place] corridor to enjoy view of outdoors, discuss scheduled family training for pm. ? ?UBE x 3 min for cardiovascular conditioning, UE strengthening. ? ?TIS tilted back approx 30*  then worked on ant wt shift using Ues cross arm grasp w/therapist to control A/P transition repeated 3 sets x 12 reps. ? ?Alternating Crossbody  reaching in sitting to encourage oblique activation/strengfening 3 x 5 sets w/slight recline of chair. ? ?At end of session, pt transported to room.  Pt left oob in wc w/alarm belt set and needs in reach ? ?Therapy Documentation ?Precautions:  ?Precautions ?Precautions: Fall ?Precaution Comments: truncal and BLE limb incoordination; absent R LE sensation with R knee buckle ?Restrictions ?Weight Bearing Restrictions: No ? ? ? ?Therapy/Group: Individual Therapy ?[name], PT ? ? ?[name] ?02/19/2022, 12:31 PM  ?

## 2022-02-19 NOTE — Progress Notes (Signed)
?                                                       PROGRESS NOTE ? ? ?Subjective/Complaints: ?Belly was feeling better today. Had bm's. Comfortable in general. Working with PT ? ?ROS: Patient denies fever, rash, sore throat, blurred vision, dizziness, nausea, vomiting, diarrhea, cough, shortness of breath or chest pain, joint or back/neck pain, headache, or mood change.  ? ? ?Objective: ?  ?No results found. ?No results for input(s): WBC, HGB, HCT, PLT in the last 72 hours. ? ? ?Recent Labs  ?  02/18/22 ?0401  ?NA 133*  ?K 3.7  ?CL 101  ?CO2 25  ?GLUCOSE 103*  ?BUN 22*  ?CREATININE 0.59*  ?CALCIUM 8.5*  ? ? ? ?Intake/Output Summary (Last 24 hours) at 02/19/2022 1019 ?Last data filed at 02/19/2022 0524 ?Gross per 24 hour  ?Intake 639 ml  ?Output 1700 ml  ?Net -1061 ml  ?  ? ?  ? ?Physical Exam: ?Vital Signs ?Blood pressure 112/71, pulse 77, temperature 97.8 ?F (36.6 ?C), temperature source Oral, resp. rate 16, height 6' (1.829 m), weight 64.7 kg, SpO2 99 %. ? ?Constitutional: No distress . Vital signs reviewed. ?HEENT: NCAT, EOMI, oral membranes moist ?Neck: supple ?Cardiovascular: RRR without murmur. No JVD    ?Respiratory/Chest: CTA Bilaterally without wheezes or rales. Normal effort    ?GI/Abdomen: BS +, non-tender, non-distended ?Ext: no clubbing, cyanosis, or edema ?Psych: pleasant and cooperative  ?Musculoskeletal:  ?   Cervical back: Neck supple. No back tenderness.  ? ?Skin: ?   General: Skin is warm and dry.  ?   Port that's accessed in R chest;--stable appearing ?  ?Neurological:  ?   Mental Status:  slow to process but more alert.  Occasional intentional tremor RUE.  Now has BLE sensory deficits for LT/pain/proprioception, ~T8-10 sensory level. Unable to move either leg. UE 4/5. No sensory findings in UE's. --neuro unchanged ? ?Assessment/Plan: ?1. Functional deficits which require 3+ hours per day of interdisciplinary therapy in a comprehensive inpatient rehab setting. ?Physiatrist is providing close  team supervision and 24 hour management of active medical problems listed below. ?Physiatrist and rehab team continue to assess barriers to discharge/monitor patient progress toward functional and medical goals ? ?Care Tool: ? ?Bathing ?   ?Body parts bathed by patient: Right arm, Left arm, Chest, Front perineal area, Abdomen, Buttocks, Right upper leg, Left upper leg, Face  ? Body parts bathed by helper: Left lower leg, Right lower leg ?  ?  ?Bathing assist Assist Level: Moderate Assistance - Patient 50 - 74% ?  ?  ?Upper Body Dressing/Undressing ?Upper body dressing   ?What is the patient wearing?: [place] only ?   ?Upper body assist Assist Level: Minimal Assistance - Patient > 75% ?   ?Lower Body Dressing/Undressing ?Lower body dressing ? ? ?   ?What is the patient wearing?: Pants ? ?  ? ?Lower body assist Assist for lower body dressing: Maximal Assistance - Patient 25 - 49% ?   ? ?Toileting ?Toileting Toileting Activity did not occur (Probation officer and hygiene only): N/A (no void or bm)  ?Toileting assist Assist for toileting: Total Assistance - Patient < 25% ?  ?  ?Transfers ?Chair/bed transfer ? ?Transfers assist ? Chair/bed transfer activity did not occur: Safety/medical concerns ? ?  Chair/bed transfer assist level: 2 Helpers ?  ?  ?Locomotion ?Ambulation ? ? ?Ambulation assist ? ? Ambulation activity did not occur: Safety/medical concerns (requires use of AD for safe attempt at ambulation with skilled assistance) ? ?  ?  ?   ? ?Walk 10 feet activity ? ? ?Assist ? Walk 10 feet activity did not occur: Safety/medical concerns ? ?  ?   ? ?Walk 50 feet activity ? ? ?Assist Walk 50 feet with 2 turns activity did not occur: Safety/medical concerns ? ?  ?   ? ? ?Walk 150 feet activity ? ? ?Assist Walk 150 feet activity did not occur: Safety/medical concerns ? ?  ?  ?  ? ?Walk 10 feet on uneven surface  ?activity ? ? ?Assist Walk 10 feet on uneven surfaces activity did not occur: Safety/medical  concerns ? ? ?  ?   ? ?Wheelchair ? ? ? ? ?Assist Is the patient using a wheelchair?: Yes ?Type of Wheelchair: Manual ?  ? ?Wheelchair assist level: Dependent - Patient 0% ?   ? ? ?Wheelchair 50 feet with 2 turns activity ? ? ? ?Assist ? ?  ?  ? ? ?Assist Level: Dependent - Patient 0%  ? ?Wheelchair 150 feet activity  ? ? ? ?Assist ?   ? ? ?Assist Level: Dependent - Patient 0%  ? ?Blood pressure 112/71, pulse 77, temperature 97.8 ?F (36.6 ?C), temperature source Oral, resp. rate 16, height 6' (1.829 m), weight 64.7 kg, SpO2 99 %. ? ?Medical Problem List and Plan: ?1. Functional deficits secondary to debility from metastatic Cancer ?            -patient may not shower- due to R chest port ?            -ELOS/Goals: 02/20/22- min A to supervision ? -Continue CIR therapies including PT, OT, hospice arrangements being made ?  -appreciate palliative care assistance ?  -home  today or tomorrow with hospice ?2.  Antithrombotics: ?-DVT/anticoagulation:  Pharmaceutical: Lovenox ?            -antiplatelet therapy: Low dose ASA.  ?3. Pain Management: Continue Tylenol prn.  ? -pain controlled ?4. Mood: LCSW to follow for evaluation and support.  ?            -antipsychotic agents: N/A ?5. Neuropsych: This patient may not be fully  capable of making decisions on his own behalf. SLUMs 11/30 ?6. Skin/Wound Care: routine pressure relief measures.  ?7. Fluids/Electrolytes/Nutrition:   ?-BUN improved 3/29---pt is drinking more fluids ?8.  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            --continue decadron 4 mg TID ?   ?9. Orthostatic hypotension: Continue to check orthostatic vitals.  ?            --TEDs and abdominal binder when out of bed. ?            --encourage fluid intake. ? Stopped orthostatic bp checks ?10. Lung Cancer/COPD: Continue Dulera with incurse.  ?11. Suboptimal  Vitamin B12: Level-218-->received IV doses X 3-->po route daily.  ?12. Hypomagnesemia:supplemented ?13. Urinary retention/neurogenic  bladder ? -foley replaced, pt feels better, continue ? -UA neg, UCX neg also ?14. Pancytopenia:  .  ?   -labs stable to improved 3/27, monitor weekly.  ?15. Constipation:   ?  - abdominal pain improved today ? -discussed importance of maintaining fluid intake ? -continue stool softener and laxatives ?  ? ?LOS: ?  13 days ?A FACE TO FACE EVALUATION WAS PERFORMED ? ?Meredith Staggers ?02/19/2022, 10:19 AM  ? ? ? ? ?

## 2022-02-19 NOTE — Progress Notes (Signed)
Physical Therapy Discharge Summary ? ?Patient Details  ?Name: Richard Santos ?MRN: [id number] ?Date of Birth: 02/27/1961 ? ?Today's Date: 02/19/2022 ? ? ?Patient has met 2 of 2 long term goals with change in POC to d/c at a dependent level using a Hoyer lift due to functional decline secondary to escalating deficits due to new spinal mass found during patient's admission. Patient to discharge at  a dependent wheelchair level.   Patient's care partner requires assistance to provide the necessary physical assistance at discharge, plan for supportive service with [facility]. ? ?Reasons goals not met: All PT goals met.  ? ?Recommendation:  ?Patient unfortunately will not benefit from continued skilled PT services due to his prognosis and functional decline. He will continue to work towards his goals of care with  [facility] . ? ?Equipment: ?Hospital Bed and [place] lift recommended and to be provided by Hospice services; loaner TIS wheelchair to be provided by [facility] medical and scheduled for delivery to patient's home.  ? ?Reasons for discharge: discharge from hospital ? ?Patient/family agrees with progress made and goals achieved: Yes ? ?PT Discharge ?Precautions/Restrictions ?Precautions ?Precautions: Fall ?Precaution Comments: paraplegia ?Restrictions ?Weight Bearing Restrictions: No ?Pain Interference ?Pain Interference ?Pain Effect on Sleep: 1. Rarely or not at all ?Pain Interference with Therapy Activities: 1. Rarely or not at all ?Pain Interference with Day-to-Day Activities: 1. Rarely or not at all ?Vision/Perception  ?Vision - History ?Ability to See in Adequate Light: 0 Adequate ?Perception ?Perception: Within Functional Limits ?Praxis ?Praxis: Impaired ?Praxis Impairment Details: Motor planning  ?Cognition ?Overall Cognitive Status: History of cognitive impairments - at baseline ?Arousal/Alertness: Awake/alert ?Orientation Level: Oriented to person;Oriented to place;Oriented to situation ?Focused  Attention: Appears intact ?Sustained Attention: Impaired ?Sustained Attention Impairment: Verbal basic;Functional basic ?Selective Attention: Impaired ?Selective Attention Impairment: Verbal basic;Functional basic ?Memory: Impaired ?Memory Impairment: Decreased short term memory;Storage deficit;Retrieval deficit;Decreased recall of new information ?Decreased Short Term Memory: Verbal basic;Functional basic ?Awareness: Impaired ?Awareness Impairment: Emergent impairment ?Safety/Judgment: Appears intact ?Sensation ?Sensation ?Light Touch: Impaired Detail ?Central sensation comments: complete loss of light touch and proprioceptive sense in B lower extremities ?Light Touch Impaired Details: Absent RLE;Absent LLE ?Hot/Cold: Impaired Detail ?Hot/Cold Impaired Details: Absent RLE;Absent LLE ?Proprioception: Impaired Detail ?Proprioception Impaired Details: Absent RLE;Absent LLE ?Coordination ?Gross Motor Movements are Fluid and Coordinated: No ?Fine Motor Movements are Fluid and Coordinated: Yes ?Coordination and Movement Description: limited by progressive paraplegia secondary to spinal mass ?Heel Shin Test: no volitional movement of B lower extremities ?Motor  ?Motor ?Motor: Paraplegia;Abnormal postural alignment and control;Abnormal tone ?Motor - Discharge Observations: Progressive paraplegia throughout admission, complete motor and sensory loss of B lower extremities  ?Mobility ?Bed Mobility ?Bed Mobility: Supine to Sit;Sit to Supine;Rolling Right;Rolling Left ?Rolling Right: Moderate Assistance - Patient 50-74%;Maximal Assistance - Patient 25-49% ?Rolling Left: Moderate Assistance - Patient 50-74%;Maximal Assistance - Patient 25-49% ?Supine to Sit: Maximal Assistance - Patient - Patient 25-49% ?Sit to Supine: Maximal Assistance - Patient 25-49% ?Transfers ?Transfers: Transfer via Geophysicist/field seismologist;Lateral/Scoot Transfers ?Stand Pivot Transfer Details:  (cuing used for squat pivot transfer) ?Lateral/Scoot Transfers: Total  Assistance - Patient < 25% ?Transfer (Assistive device): Other (Comment) (slide board) ?Transfer via Lift Equipment:  Optician, dispensing (dependent)) ?Locomotion  ?Gait ?Ambulation: No ?Gait ?Gait: No ?Stairs / Additional Locomotion ?Stairs: No ?Wheelchair Mobility ?Wheelchair Mobility: Yes ?Wheelchair Assistance: Dependent - Patient 0% ?Wheelchair Parts Management: Needs assistance  ?Trunk/Postural Assessment  ?Cervical Assessment ?Cervical Assessment: Exceptions to [facility] (slight forward head) ?Thoracic Assessment ?Thoracic Assessment: Exceptions  to [facility] (slight thoracic rounding) ?Lumbar Assessment ?Lumbar Assessment: Exceptions to [facility] (posterior pelvic tilt in sitting) ?Postural Control ?Postural Control: Deficits on evaluation ?Trunk Control: impaired requiring mod-max A for sitting EOB ?Protective Responses: delayed and insufficient, posterior bias in sitting  ?Balance ?Static Sitting Balance ?Static Sitting - Balance Support: Feet supported;Bilateral upper extremity supported ?Static Sitting - Level of Assistance: 3: Mod assist ?Dynamic Sitting Balance ?Dynamic Sitting - Balance Support: Feet supported;Bilateral upper extremity supported ?Dynamic Sitting - Level of Assistance: 2: Max assist ?Dynamic Sitting - Balance Activities: Forward lean/weight shifting;Lateral lean/weight shifting;Reaching for objects;Reaching across midline ?Sitting balance - Comments: wt shifting, transfers, supported sitting in wc activities performed w/wc in tilt due to inability to maintain upright without support. ?Static Standing Balance ?Static Standing - Comment/# of Minutes: unable due to decline in function ?Extremity Assessment  ?RLE Assessment ?RLE Assessment: Exceptions to [facility] ?Passive Range of Motion (PROM) Comments: WFLS ?Active Range of Motion (AROM) Comments: 0/5 ?General Strength Comments: 0/5 ?LLE Assessment ?Passive Range of Motion (PROM) Comments: WFLs ?Active Range of Motion (AROM) Comments: 0/5 ?General Strength Comments:  0/5 ? ? ? ?Doreene Burke PT, DPT ? ?02/19/2022, 4:29 PM ?

## 2022-02-19 NOTE — Progress Notes (Signed)
Occupational Therapy Session Note ? ?Patient Details  ?Name: Richard Santos ?MRN: [id number] ?Date of Birth: Sep 11, 1961 ? ?Today's Date: 02/19/2022 ?OT Individual Time: 4098-1191 ?OT Individual Time Calculation (min): 48 min  ? ? ?Short Term Goals: ?Week 1:  OT Short Term Goal 1 (Week 1): Pt will complete toilet transfer with min A ?OT Short Term Goal 1 - Progress (Week 1): Not met ?OT Short Term Goal 2 (Week 1): Pt will complete toileting task with mod A ?OT Short Term Goal 2 - Progress (Week 1): Not met ?OT Short Term Goal 3 (Week 1): Pt will engage in dynamic standing balance task without locking knees for 1 min ?OT Short Term Goal 3 - Progress (Week 1): Not met ?OT Short Term Goal 4 (Week 1): Pt will complete LB dressing with mod A ?OT Short Term Goal 4 - Progress (Week 1): Not met ?Week 2:  OT Short Term Goal 1 (Week 2): STG=LTG d/t ELOS as bed BADL level d/t disease progression/progress ? ?Skilled Therapeutic Interventions/Progress Updates:  ?  Patient seen this date at bed LOF, patient indicated that he had some soreness in his stomach, but it wasn't to troublesome.  Patient was able to come from lying in supine to sitting in bed while maintaining the position with a count of 10 for 3x.  The pt was able to simulate bathing exercise completing his UB to his upper thigh region with s/u and verbal cues.  The pt was MaxA for LB task performance.  The pt was able to roll from supine to sideling with ModA for the right and left side.  The pt was instructed in relaxation breathing to improve compliance.  The patient indicated that his main objective was to be able to assist his wife in his care. The pt bed side table was in place and his call light was available with all additional needs addressed.  ? ?Therapy Documentation ?Precautions:  ?Precautions ?Precautions: Fall ?Precaution Comments: truncal and BLE limb incoordination; absent R LE sensation with R knee buckle ?Restrictions ?Weight Bearing Restrictions:  No ? ? ?Therapy/Group: Individual Therapy ? ?Yvonne Kendall ?02/19/2022, 12:17 PM ?

## 2022-02-19 NOTE — Progress Notes (Signed)
Patient ID: Richard Santos, male   DOB: [birth year], 61 y.o.   MRN: [id number] ? ?SW received updates from [facility] that pt will be received today. Will wait for next steps. ? ?SW received updates from that pt current Medicaid only covers COVID testing.  ? ?*SW received updates from [name] reporting we will move forward with discharge tomorrow  and will provide ETA on DME. SW will schedule transportation for 12:30pm in the event DME arrive earlier than expected. Transportation will be adjusted as needed.  ? ?SW scheduled ambulance pick up wth [facility] for Friday (3/31) at 12:30pm.  ? ?SW updated medical team. SW discussed with pt and pt wife in room. Wife is aware transportation will be adjusted according to when the DME arrives.  ? ?[name], MSW, LCSWA ?Office: [phone] ?Cell: [phone] ?Fax: [phone]  ?

## 2022-02-19 NOTE — Progress Notes (Signed)
Physical Therapy Session Note ? ?Patient Details  ?Name: Richard Santos ?MRN: [id number] ?Date of Birth: April 24, 1961 ? ?Today's Date: 02/19/2022 ?PT Individual Time: 6381-7711 ?PT Individual Time Calculation (min): 75 min  ? ?Short Term Goals: ?Week 2:  PT Short Term Goal 1 (Week 2): STG=LTG due to ELOS. ? ?Skilled Therapeutic Interventions/Progress Updates:  ?   ?Patient in bed with his wife and CSW at bedside upon PT arrival. Patient alert and agreeable to PT session. Patient denied pain during session. ?  ?Discussed d/c planning, patient's wife provided pictures of home set-up for space for DME in the den. Discussed furniture arrangement for optimal space for [place] lift use and benefits of wood flooring with transfers. TIS w/c to be delivered on Monday to accommodate sitting OOB and outdoors per patient's personal goals. Educated on requesting hospice staff to come out to continue [place] lift training for first patient transfer to the TIS w/c for patient and caregiver safety and additional assist. Patient and his wife in agreement. Recommended sitting EOB with +2 assist over the weekend if the patient would like to be off his back.  ? ?Focused session on bed mobility and Hoyer lift training for management of transfers to TIS w/c at d/c. ?  ?Patient's wife performed mod-max assist +2 for supine to/from sit with patient sitting EOB with mod-max A for trunk support <2 min due to fatigue. She then performed mod-max assist with rolling patient, total A for placement/removal of the sling, and management of Hoyer lift and TIS w/c for dependent bed<>TIS w/c transfers with mod cues for coaching and minimal assist throughout for time management. Took multiple videos of various parts of transfer for improved recall of instructions, sequencing, and safety tips.  ? ?Reinforced have Hospice staff present for first transfer for safety, patient and his wife continued to agree with this recommendation. ? ?Patient and his wife  requested the patient get a shower prior to d/c tomorrow. Discussed with scheduling and OT to arrange morning ADL in the shower tomorrow.  ?  ?Patient in bed with his wife at bedside at end of session with breaks locked, bed alarm set, and all needs within reach.  ? ?Therapy Documentation ?Precautions:  ?Precautions ?Precautions: Fall ?Precaution Comments: truncal and BLE limb incoordination; absent R LE sensation with R knee buckle ?Restrictions ?Weight Bearing Restrictions: No ? ? ? ?Therapy/Group: Individual Therapy ? ?Doreene Burke PT, DPT ? ?02/19/2022, 4:04 PM  ?

## 2022-02-20 ENCOUNTER — Encounter: Payer: Self-pay | Admitting: Internal Medicine

## 2022-02-20 ENCOUNTER — Other Ambulatory Visit (HOSPITAL_COMMUNITY): Payer: Self-pay

## 2022-02-20 ENCOUNTER — Other Ambulatory Visit: Payer: Self-pay | Admitting: Internal Medicine

## 2022-02-20 DIAGNOSIS — C7931 Secondary malignant neoplasm of brain: Secondary | ICD-10-CM | POA: Diagnosis not present

## 2022-02-20 DIAGNOSIS — C3490 Malignant neoplasm of unspecified part of unspecified bronchus or lung: Secondary | ICD-10-CM | POA: Diagnosis not present

## 2022-02-20 DIAGNOSIS — R339 Retention of urine, unspecified: Secondary | ICD-10-CM | POA: Diagnosis not present

## 2022-02-20 DIAGNOSIS — R5381 Other malaise: Secondary | ICD-10-CM | POA: Diagnosis not present

## 2022-02-20 MED ORDER — MOMETASONE FURO-FORMOTEROL FUM 100-5 MCG/ACT IN AERO
2.0000 | INHALATION_SPRAY | Freq: Two times a day (BID) | RESPIRATORY_TRACT | 0 refills | Status: DC
  Filled 2022-02-20: qty 13, 30d supply, fill #0

## 2022-02-20 MED ORDER — FOLIC ACID 1 MG PO TABS: 1.0000 mg | ORAL_TABLET | Freq: Every day | ORAL | 0 refills | Status: AC

## 2022-02-20 MED ORDER — THIAMINE HCL 100 MG PO TABS
100.0000 mg | ORAL_TABLET | Freq: Every day | ORAL | 0 refills | Status: DC
  Filled 2022-02-20: qty 30, 30d supply, fill #0

## 2022-02-20 MED ORDER — LIDOCAINE HCL URETHRAL/MUCOSAL 2 % EX GEL: CUTANEOUS | 0 refills | Status: DC | PRN

## 2022-02-20 MED ORDER — DIVALPROEX SODIUM 250 MG PO DR TAB
250.0000 mg | DELAYED_RELEASE_TABLET | Freq: Two times a day (BID) | ORAL | 0 refills | Status: DC
  Filled 2022-02-20: qty 60, 30d supply, fill #0

## 2022-02-20 MED ORDER — CYANOCOBALAMIN 1000 MCG PO TABS: 1000.0000 ug | ORAL_TABLET | Freq: Every day | ORAL | 0 refills | Status: DC

## 2022-02-20 MED ORDER — HYDROCORTISONE 0.5 % EX CREA
TOPICAL_CREAM | Freq: Four times a day (QID) | CUTANEOUS | 0 refills | Status: DC | PRN
  Filled 2022-02-20: qty 30, fill #0

## 2022-02-20 MED ORDER — CYANOCOBALAMIN 1000 MCG PO TABS
1000.0000 ug | ORAL_TABLET | Freq: Every day | ORAL | 0 refills | Status: DC
  Filled 2022-02-20: qty 30, 30d supply, fill #0

## 2022-02-20 MED ORDER — UMECLIDINIUM BROMIDE 62.5 MCG/ACT IN AEPB
1.0000 | INHALATION_SPRAY | Freq: Every day | RESPIRATORY_TRACT | 0 refills | Status: DC
Start: 2022-02-21 — End: 2022-02-20
  Filled 2022-02-20: qty 30, 30d supply, fill #0

## 2022-02-20 MED ORDER — BREZTRI AEROSPHERE 160-9-4.8 MCG/ACT IN AERO: 2.0000 | INHALATION_SPRAY | Freq: Two times a day (BID) | RESPIRATORY_TRACT | Status: AC

## 2022-02-20 MED ORDER — FLAVOXATE HCL 100 MG PO TABS: 100.0000 mg | ORAL_TABLET | Freq: Three times a day (TID) | ORAL | 0 refills | Status: AC | PRN

## 2022-02-20 MED ORDER — BISACODYL 10 MG RE SUPP
10.0000 mg | Freq: Every day | RECTAL | 0 refills | Status: DC
  Filled 2022-02-20: qty 12, 12d supply, fill #0

## 2022-02-20 MED ORDER — SENNOSIDES-DOCUSATE SODIUM 8.6-50 MG PO TABS: 2.0000 | ORAL_TABLET | Freq: Two times a day (BID) | ORAL | 0 refills | Status: AC

## 2022-02-20 MED ORDER — BISACODYL 10 MG RE SUPP: 10.0000 mg | Freq: Every day | RECTAL | 0 refills | Status: AC

## 2022-02-20 MED ORDER — DEXAMETHASONE 4 MG PO TABS
4.0000 mg | ORAL_TABLET | Freq: Three times a day (TID) | ORAL | 0 refills | Status: DC
  Filled 2022-02-20: qty 90, 30d supply, fill #0

## 2022-02-20 MED ORDER — HYDROCORTISONE 0.5 % EX CREA: TOPICAL_CREAM | Freq: Four times a day (QID) | CUTANEOUS | 0 refills | Status: AC | PRN

## 2022-02-20 MED ORDER — SENNOSIDES-DOCUSATE SODIUM 8.6-50 MG PO TABS
2.0000 | ORAL_TABLET | Freq: Two times a day (BID) | ORAL | 0 refills | Status: DC
  Filled 2022-02-20: qty 120, 30d supply, fill #0

## 2022-02-20 MED ORDER — DIVALPROEX SODIUM 250 MG PO DR TAB: 250.0000 mg | DELAYED_RELEASE_TABLET | Freq: Two times a day (BID) | ORAL | 0 refills | Status: DC

## 2022-02-20 MED ORDER — FLAVOXATE HCL 100 MG PO TABS
100.0000 mg | ORAL_TABLET | Freq: Three times a day (TID) | ORAL | 0 refills | Status: DC | PRN
Start: 2022-02-20 — End: 2022-02-20
  Filled 2022-02-20: qty 30, 10d supply, fill #0

## 2022-02-20 MED ORDER — DEXAMETHASONE 4 MG PO TABS: 4.0000 mg | ORAL_TABLET | Freq: Three times a day (TID) | ORAL | 0 refills | Status: AC

## 2022-02-20 MED ORDER — LIDOCAINE HCL URETHRAL/MUCOSAL 2 % EX GEL
CUTANEOUS | 0 refills | Status: DC | PRN
  Filled 2022-02-20: qty 30, fill #0

## 2022-02-20 MED ORDER — LIDOCAINE HCL URETHRAL/MUCOSAL 2 % EX GEL: CUTANEOUS | 0 refills | Status: AC | PRN

## 2022-02-20 MED ORDER — FOLIC ACID 1 MG PO TABS
1.0000 mg | ORAL_TABLET | Freq: Every day | ORAL | 0 refills | Status: DC
  Filled 2022-02-20: qty 30, 30d supply, fill #0

## 2022-02-20 MED ORDER — THIAMINE HCL 100 MG PO TABS: 100.0000 mg | ORAL_TABLET | Freq: Every day | ORAL | 0 refills | Status: AC

## 2022-02-20 NOTE — Progress Notes (Signed)
? ?Palliative Medicine Inpatient Follow Up Note ? ? 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 He was admitted on 02/03/22 for work up and MRI brain done revealing significant decrease in size of enhancing lesions (in pons, bilateral temporal lobes, left periventricular white matter and left frontal lobe). Decadron increased to 2 mg IV BID. He was placed on CIWA protocol due to hx of daily ETOH (down from 49 drinks to 3 drinks/day?). CT abdomen/pelvis done revealing new 5-10% left PTX, new acute/subacute right 9-10 rib FX, right foraminal impingement at L5/S1, stable appearance of stable left lung nodule and sclerotic metastatic disease involving thoracic spine and proximal femurs.  ?  ?Dr. Earlie Server consulted and recommended maintaining patient on 2 mg bid as he was feeling better. He continues to have RLE weakness with tendency to buckle, needs increased time to process and follow one step commands, has orthostatic changes with standing attempts as well as  right lateral and posterior lean. CIR recommended due to functional decline. ?  ?Palliative care has been asked to get involved due to lack of progress at CIR.  ? ?Today's Discussion (02/20/2022): ? ?*Please note that this is a verbal dictation therefore any spelling or grammatical errors are due to the "[place] One" system interpretation. ? ?Chart reviewed inclusive of vital signs, progress notes, laboratory results, and diagnostic images.  ? ?I met with Richard Santos at bedside. He was resting in NAD. We discussed the plan for him to transfer home this later afternoon. Richard Santos asked me if he was "making the right choice". I shared that from what I understand  radiation would not make him able to mobilize again. This is something that he would not find consistent with the quality of life he would want. He shares if he is unable to be independent than he would rather not continue on. ? ?I shared that the right choice is the choice which is best for him. If Richard Santos believes that going home with hospice is consistent with what he wants than I shared being in full support of that. He states that this is what he wants.  ? ?Richard Santos was thankful for my time.  ? ?Questions and concerns addressed  ? ?Palliative Support Provided ? ?Objective Assessment: ?Vital Signs ?Vitals:  ? 02/19/22 2054 02/20/22 0435  ?BP:  120/84  ?Pulse:  82  ?Resp:  18  ?Temp:  97.7 ?F (36.5 ?C)  ?SpO2: 99% 100%  ? ? ?Intake/Output Summary (Last 24 hours) at 02/20/2022 1202 ?Last data filed at 02/20/2022 0400 ?Gross per 24 hour  ?Intake 100 ml  ?Output 500 ml  ?Net -400 ml  ? ? ?Last Weight  Most recent update: 02/06/2022  8:23 PM  ? ? Weight  ?64.7 kg (142 lb 10.2 oz)  ?      ? ?  ? ?Gen:  Older caucasian M in NAD ?HEENT: moist mucous membranes ?CV: Irregular rate and rhythm ?PULM: On RA ?ABD: soft/nontender ?EXT: No edema ?Neuro: Alert and oriented x3 ? ?SUMMARY OF RECOMMENDATIONS   ?FULL CODE --> This is an ongoing conversation Hospice is aware ? ?Plan to transition home with [facility] hospice later today ? ?MDM - High ?_____________________________________________________________________________________ ?Richard Lull  Jacobi Santos ?[facility] Team ?Team Cell Phone: [phone] ?Please utilize secure chat with additional questions, if there is no response within 30 minutes please call the above phone number ? ?Palliative Medicine Team providers are available by phone from 7am to 7pm daily and can be reached through the team cell phone.  ?Should this patient require assistance outside of these hours, please call the patient's attending physician. ? ? ? ? ?

## 2022-02-20 NOTE — Progress Notes (Signed)
Patient ID: Richard Santos, male   DOB: 1961/06/06, 61 y.o.   MRN: [id number] ? ?SW received updates from [name] reporting DME arrival between 11am-1pm. Hospice intake will be completed tomorrow.  ? ?SW spoke with pt wife who reports she was informed of the same. SW will continue to periodically check in about DME delivery. Wife prefers [place] for meds so she does not have to try and find someone to sit with him this evening.  ? ?*SW informed pt wife TOC unable to fill meds by 12:30pm which is patient's scheduled transport. Wife understands meds will eb sent to [facility] in [place].  ? ?Transportation running late. Pt wife encouraged to pick up meds with delay in [facility] pick up.  ? ?[name], MSW, LCSWA ?Office: [phone] ?Cell: [phone] ?Fax: [phone]  ?

## 2022-02-20 NOTE — Progress Notes (Signed)
Inpatient Rehabilitation Discharge Medication Review by a Pharmacist ? ?A complete drug regimen review was completed for this patient to identify any potential clinically significant medication issues. ? ?High Risk Drug Classes Is patient taking? Indication by Medication  ?Antipsychotic No   ?Anticoagulant No   ?Antibiotic No   ?Opioid No   ?Antiplatelet No   ?Hypoglycemics/insulin No   ?Vasoactive Medication No   ?Chemotherapy No   ?Other Yes Depakote for seizures ?Lavonia Drafts for asthma/COPD  ? ? ? ?Type of Medication Issue Identified Description of Issue Recommendation(s)  ?Drug Interaction(s) (clinically significant) ?    ?Duplicate Therapy ?    ?Allergy ?    ?No Medication Administration End Date ?    ?Incorrect Dose ?    ?Additional Drug Therapy Needed ?    ?Significant med changes from prior encounter (inform family/care partners about these prior to discharge).    ?Other ?    ? ? ?Clinically significant medication issues were identified that warrant physician communication and completion of prescribed/recommended actions by midnight of the next day:  No ? ?Pharmacist comments: Discharging with hospice ? ?Time spent performing this drug regimen review (minutes):  20 minutes ? ? ?Tad Moore ?02/20/2022 11:10 AM ?

## 2022-02-20 NOTE — Progress Notes (Signed)
?                                                       PROGRESS NOTE ? ? ?Subjective/Complaints: ?Just had a bm. Belly a little sore but better. No new issues. For hospice today ? ?ROS: Patient denies fever, rash, sore throat, blurred vision, dizziness, nausea, vomiting, diarrhea, cough, shortness of breath or chest pain, headache, or mood change.  ? ? ?Objective: ?  ?No results found. ?No results for input(s): WBC, HGB, HCT, PLT in the last 72 hours. ? ? ?Recent Labs  ?  02/18/22 ?0401  ?NA 133*  ?K 3.7  ?CL 101  ?CO2 25  ?GLUCOSE 103*  ?BUN 22*  ?CREATININE 0.59*  ?CALCIUM 8.5*  ? ? ? ?Intake/Output Summary (Last 24 hours) at 02/20/2022 1000 ?Last data filed at 02/20/2022 0400 ?Gross per 24 hour  ?Intake 100 ml  ?Output 500 ml  ?Net -400 ml  ?  ? ?  ? ?Physical Exam: ?Vital Signs ?Blood pressure 120/84, pulse 82, temperature 97.7 ?F (36.5 ?C), temperature source Oral, resp. rate 18, height 6' (1.829 m), weight 64.7 kg, SpO2 100 %. ? ?General: No acute distress ?HEENT: NCAT, EOMI, oral membranes moist ?Cards: reg rate  ?Chest: normal effort ?Abdomen: Soft, NT, ND ?Skin: dry, intact ?Extremities: no edema ?Psych: pleasant and appropriate  ?Musculoskeletal:  ?   Cervical back: Neck supple. No back tenderness.  ? ?Skin: ?   General: Skin is warm and dry.  ?   Port that's accessed in R chest;clean ?  ?Neurological:  ?   Mental Status:  slow to process but more alert.  Occasional intentional tremor RUE.  T10 sensory level, 0/5 LE's. Mild resting tone ? ?Assessment/Plan: ?1. Functional deficits which require 3+ hours per day of interdisciplinary therapy in a comprehensive inpatient rehab setting. ?Physiatrist is providing close team supervision and 24 hour management of active medical problems listed below. ?Physiatrist and rehab team continue to assess barriers to discharge/monitor patient progress toward functional and medical goals ? ?Care Tool: ? ?Bathing ?   ?Body parts bathed by patient: Right arm, Left arm, Chest,  Front perineal area, Abdomen, Face  ? Body parts bathed by helper: Buttocks, Right upper leg, Left upper leg, Right lower leg, Left lower leg ?  ?  ?Bathing assist Assist Level: Moderate Assistance - Patient 50 - 74% ?  ?  ?Upper Body Dressing/Undressing ?Upper body dressing   ?What is the patient wearing?: Pull over shirt ?   ?Upper body assist Assist Level: Minimal Assistance - Patient > 75% ?   ?Lower Body Dressing/Undressing ?Lower body dressing ? ? ?   ?What is the patient wearing?: Pants, Incontinence brief ? ?  ? ?Lower body assist Assist for lower body dressing: Total Assistance - Patient < 25% ?   ? ?Toileting ?Toileting Toileting Activity did not occur (Probation officer and hygiene only): N/A (no void or bm)  ?Toileting assist Assist for toileting: Dependent - Patient 0% ?  ?  ?Transfers ?Chair/bed transfer ? ?Transfers assist ? Chair/bed transfer activity did not occur: Safety/medical concerns ? ?Chair/bed transfer assist level: Dependent - mechanical lift ?Chair/bed transfer assistive device: Sliding board ?  ?Locomotion ?Ambulation ? ? ?Ambulation assist ? ? Ambulation activity did not occur: Safety/medical concerns (unable due to new progressive paraplegia) ? ?  ?  ?   ? ?  Walk 10 feet activity ? ? ?Assist ? Walk 10 feet activity did not occur: Safety/medical concerns ? ?  ?   ? ?Walk 50 feet activity ? ? ?Assist Walk 50 feet with 2 turns activity did not occur: Safety/medical concerns ? ?  ?   ? ? ?Walk 150 feet activity ? ? ?Assist Walk 150 feet activity did not occur: Safety/medical concerns ? ?  ?  ?  ? ?Walk 10 feet on uneven surface  ?activity ? ? ?Assist Walk 10 feet on uneven surfaces activity did not occur: Safety/medical concerns ? ? ?  ?   ? ?Wheelchair ? ? ? ? ?Assist Is the patient using a wheelchair?: Yes ?Type of Wheelchair:  (TIS w/c) ?  ? ?Wheelchair assist level: Dependent - Patient 0% ?Max wheelchair distance: >150 ft  ? ? ?Wheelchair 50 feet with 2 turns activity ? ? ? ?Assist ? ?   ?  ? ? ?Assist Level: Dependent - Patient 0%  ? ?Wheelchair 150 feet activity  ? ? ? ?Assist ?   ? ? ?Assist Level: Dependent - Patient 0%  ? ?Blood pressure 120/84, pulse 82, temperature 97.7 ?F (36.5 ?C), temperature source Oral, resp. rate 18, height 6' (1.829 m), weight 64.7 kg, SpO2 100 %. ? ?Medical Problem List and Plan: ?1. Functional deficits secondary to debility from metastatic Cancer ?            -dc home today with home hospice ?2.  Antithrombotics: ?-DVT/anticoagulation:  Pharmaceutical: Lovenox--dc ?            -antiplatelet therapy: Low dose ASA.  ?3. Pain Management: Continue Tylenol prn.  ? -pain controlled at present ?4. Mood: LCSW to follow for evaluation and support.  ?            -antipsychotic agents: N/A ?5. Neuropsych: This patient may not be fully  capable of making decisions on his own behalf. SLUMs 11/30 ?6. Skin/Wound Care: routine pressure relief measures.  ?7. Fluids/Electrolytes/Nutrition:   ?-BUN improved 3/29---pt is drinking more fluids ?8.  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            --  decadron 4 mg TID, continue further as indicated by oncology ?   ?9. Orthostatic hypotension: Continue to check orthostatic vitals.  ?            --TEDs and abdominal binder when out of bed. ?            --encourage fluid intake. ? Stopped orthostatic bp checks ?10. Lung Cancer/COPD: Continue Dulera with incurse.  ?11. Suboptimal  Vitamin B12: Level-218-->received IV doses X 3-->po route daily.  ?12. Hypomagnesemia:supplemented ?13. Urinary retention/neurogenic bladder ? -fhome with foley ?14. Pancytopenia:  .  ?   -labs stable to improved 3/27, monitor weekly.  ?15. Constipation:   ?  -maintain regular bowel movements ? ?LOS: ?14 days ?A FACE TO FACE EVALUATION WAS PERFORMED ? ?Meredith Staggers ?02/20/2022, 10:00 AM  ? ? ? ? ?

## 2022-02-20 NOTE — Progress Notes (Signed)
Inpatient Rehabilitation Care Coordinator ?Discharge Note  ? ?Patient Details  ?Name: Richard Santos ?MRN: [id number] ?Date of Birth: 10-04-1961 ? ? ?Discharge location: D/c to home with hospice ? ?Length of Stay: 13 days ? ?Discharge activity level: w/c dependent ? ?Home/community participation: Limited ? ?Patient response YT:RZNBVA Literacy - How often do you need to have someone help you when you read instructions, pamphlets, or other written material from your doctor or pharmacy?: Never ? ?Patient response PO:LIDCVU Isolation - How often do you feel lonely or isolated from those around you?: Never ? ?Services provided included: PT, RD, MD, SLP, OT, RN, CM, TR, Pharmacy, Neuropsych, SW ? ?Financial Services:  ?Charity fundraiser Utilized: Private Insurance ?[employer] ? ?Choices offered to/list presented to: yes ? ?Follow-up services arranged:  ?Other (Comment), DME (Home with hospice- Manufacturing engineer [facility]).) ?   ?  ?DME : DME provided- hospital bed, hoyer lift ?  ? ?Patient response to transportation need: ?Is the patient able to respond to transportation needs?: Yes ?In the past 12 months, has lack of transportation kept you from medical appointments or from getting medications?: No ?In the past 12 months, has lack of transportation kept you from meetings, work, or from getting things needed for daily living?: No ? ?Comments (or additional information): ? ?Patient/Family verbalized understanding of follow-up arrangements:  Yes ? ?Individual responsible for coordination of the follow-up plan: contact pt wife Richard Santos ? ?Confirmed correct DME delivered: Rana Snare 02/20/2022   ? ?Rana Snare ?

## 2022-02-20 NOTE — Progress Notes (Signed)
RT came to patient room to give AM [facility] and [place] however patient being cleaned up and unable to give at this time.  ?

## 2022-02-20 NOTE — Discharge Summary (Signed)
Physician Discharge Summary  ?Patient ID: ?Richard Santos ?MRN: [id number] ?DOB/AGE: 05-15-1961 61 y.o.  61 y.o. ? ?Admit date: 02/06/2022 ?Discharge date: 02/20/2022 ? ?Discharge Diagnoses:  ?Principal Problem: ?  Debility ?Active Problems: ?  Small cell lung cancer, left upper lobe ([place]) ?  Metastatic cancer to brain [facility]) ?  Cancer [facility]) ?  Metastatic cancer to spine [facility]) ?  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  Neurogenic bladder ?  Neurogenic bowel ? ? ?Discharged Condition: poor ? ?Significant Diagnostic Studies: ? ?MR THORACIC SPINE W WO CONTRAST ? 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 Chronic low back pain EXAM: MRI THORACIC AND LUMBAR SPINE WITHOUT AND WITH CONTRAST TECHNIQUE: Multiplanar and multiecho pulse sequences of the thoracic and lumbar spine were obtained without and with intravenous contrast. CONTRAST:  84mL GADAVIST GADOBUTROL 1 MMOL/ML IV SOLN COMPARISON:  CT 02/04/2022 fall FINDINGS: MRI THORACIC SPINE FINDINGS Alignment:  Physiologic. Vertebrae: 1.5 cm enhancing bone lesion within the anterior aspect of the T7 vertebral body. Additional smaller enhancing lesions noted within the posterior elements of the T5, T6, and T7 levels including lesion centered at the right T7 pedicle. Additional marrow replacing lesion is seen within the proximal sternum (series 3, image 8). No pathologic fracture. No evidence of discitis. Cord: Large enhancing intramedullary lesion within the lower thoracic cord at the T9 and T10 level extending 4.4 cm in length (series 17, image 8). Lesion is minimally expansile. Syrinx above and below the lesion extending cranially to the T7-8 disc level and caudally to the level of the T11 inferior endplate (series 4, image 9). No additional lesions are seen within the thoracic cord. No abnormal extramedullary enhancement. Paraspinal and other soft tissues: Post treatment changes within the visualized left lung, not well assessed. Disc levels:  No focal disc protrusion. No foraminal or canal stenosis at any level. MRI LUMBAR SPINE FINDINGS Segmentation:  Standard. Alignment:  Physiologic. Vertebrae: No marrow replacing bone lesions are seen within the lumbar spine or proximal sacrum. No evidence of discitis. No fracture. Conus medullaris: Extends to the L1 level and appears normal. No mass lesion or abnormal postcontrast enhancement. Paraspinal and other soft tissues: None. Disc levels: T12-L1 through L3-L4: Unremarkable. L4-L5: Diffuse disc bulge with left foraminal/extraforaminal protrusion. Mild bilateral facet arthropathy. Mild mass effect upon the exited left L4 nerve root. No foraminal or canal stenosis. L5-S1: Disc height loss with diffuse disc osteophyte complex. Mild bilateral facet arthropathy. Mild-moderate right foraminal stenosis. No canal stenosis. IMPRESSION: 1. Large enhancing intramedullary lesion within the lower thoracic cord at the T9 and T10 level extending 4.4 cm in length with surrounding syrinx. Findings are most compatible with metastatic disease given history of malignancy. 2. Osseous metastatic lesions within the T7 vertebral body and posterior elements of the T5, T6, and T7 levels as well as within the proximal sternum. 3. Lumbar spondylosis with mild-moderate right foraminal stenosis at L5-S1. At L4-L5, there is a left foraminal/extraforaminal protrusion resulting in mild mass effect upon the exited left L4 nerve root. 4. No foraminal or canal stenosis at any level within the thoracic spine. Electronically Signed   By: Davina Poke D.O.   On: 02/15/2022 16:23  ? ?MR Lumbar Spine W Wo Contrast ? 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 Chronic low back pain EXAM: MRI THORACIC AND LUMBAR SPINE WITHOUT AND WITH CONTRAST TECHNIQUE: Multiplanar and multiecho pulse sequences of the thoracic and lumbar spine were obtained without and with intravenous  contrast. CONTRAST:  36mL  GADAVIST GADOBUTROL 1 MMOL/ML IV SOLN COMPARISON:  CT 02/04/2022 fall FINDINGS: MRI THORACIC SPINE FINDINGS Alignment:  Physiologic. Vertebrae: 1.5 cm enhancing bone lesion within the anterior aspect of the T7 vertebral body. Additional smaller enhancing lesions noted within the posterior elements of the T5, T6, and T7 levels including lesion centered at the right T7 pedicle. Additional marrow replacing lesion is seen within the proximal sternum (series 3, image 8). No pathologic fracture. No evidence of discitis. Cord: Large enhancing intramedullary lesion within the lower thoracic cord at the T9 and T10 level extending 4.4 cm in length (series 17, image 8). Lesion is minimally expansile. Syrinx above and below the lesion extending cranially to the T7-8 disc level and caudally to the level of the T11 inferior endplate (series 4, image 9). No additional lesions are seen within the thoracic cord. No abnormal extramedullary enhancement. Paraspinal and other soft tissues: Post treatment changes within the visualized left lung, not well assessed. Disc levels: No focal disc protrusion. No foraminal or canal stenosis at any level. MRI LUMBAR SPINE FINDINGS Segmentation:  Standard. Alignment:  Physiologic. Vertebrae: No marrow replacing bone lesions are seen within the lumbar spine or proximal sacrum. No evidence of discitis. No fracture. Conus medullaris: Extends to the L1 level and appears normal. No mass lesion or abnormal postcontrast enhancement. Paraspinal and other soft tissues: None. Disc levels: T12-L1 through L3-L4: Unremarkable. L4-L5: Diffuse disc bulge with left foraminal/extraforaminal protrusion. Mild bilateral facet arthropathy. Mild mass effect upon the exited left L4 nerve root. No foraminal or canal stenosis. L5-S1: Disc height loss with diffuse disc osteophyte complex. Mild bilateral facet arthropathy. Mild-moderate right foraminal stenosis. No canal stenosis. IMPRESSION: 1. Large enhancing  intramedullary lesion within the lower thoracic cord at the T9 and T10 level extending 4.4 cm in length with surrounding syrinx. Findings are most compatible with metastatic disease given history of malignancy. 2. Osseous metastatic lesions within the T7 vertebral body and posterior elements of the T5, T6, and T7 levels as well as within the proximal sternum. 3. Lumbar spondylosis with mild-moderate right foraminal stenosis at L5-S1. At L4-L5, there is a left foraminal/extraforaminal protrusion resulting in mild mass effect upon the exited left L4 nerve root. 4. No foraminal or canal stenosis at any level within the thoracic spine. Electronically Signed   By: Davina Poke D.O.   On: 02/15/2022 16:23  ? ? ?DG Abd Portable 1V ? ?Result Date: 02/16/2022 ?CLINICAL DATA:  Constipation EXAM: PORTABLE ABDOMEN - 1 VIEW COMPARISON:  CT done on 02/04/2022 FINDINGS: Bowel gas pattern is nonspecific. Stomach is moderately distended. There is no significant small bowel dilation. Gas and stool are present in colon. No abnormal masses or calcifications are seen. Kidneys are partly obscured by bowel contents. There are numerous metallic densities in the prostate suggesting previous brachytherapy. Degenerative changes are noted in the lower lumbar spine. IMPRESSION: No significant radiographic abnormality is seen in the abdomen. Electronically Signed   By: Elmer Picker M.D.   On: 02/16/2022 18:40  ? ?DG Knee AP/LAT W/Sunrise Right ? ?Result Date: 02/06/2022 ?CLINICAL DATA:  Pain.  Abrasion patella after fall at home. EXAM: RIGHT KNEE 3 VIEWS COMPARISON:  None. FINDINGS: No evidence of fracture, dislocation, or joint effusion. No evidence of arthropathy or other focal bone abnormality. Soft tissues are unremarkable. Vascular calcifications are noted. IMPRESSION: No acute abnormality. Electronically Signed   By: Yvonne Kendall M.D.   On: 02/06/2022 13:41   ? ?Labs:  ?Basic Metabolic  Panel: ? ?  Latest Ref Rng & Units 02/18/2022   ?  4:01 AM 02/16/2022  ?  4:03 AM 02/13/2022  ?  6:01 AM  ?BMP  ?Glucose 70 - 99 mg/dL 103   112   103    ?BUN 6 - 20 mg/dL 22   41   25    ?Creatinine 0.61 - 1.24 mg/dL 0.59   0.77   0.67    ?Sodium 135 - 145 mmol/L

## 2022-02-20 NOTE — Progress Notes (Signed)
Occupational Therapy Session Note ? ?Patient Details  ?Name: Richard Santos ?MRN: [id number] ?Date of Birth: 1961-06-06 ? ?Today's Date: 02/20/2022 ?OT Individual Time: 1103-1594 ?OT Individual Time Calculation (min): 44 min  ? ? ?Short Term Goals: ?Week 2:  OT Short Term Goal 1 (Week 2): STG=LTG d/t ELOS as bed BADL level d/t disease progression/progress ?Week 3:    ? ?Skilled Therapeutic Interventions/Progress Updates:  ?  1:1 Saw the note about showering today; however when offered to patient- pt declined. Encouraged changing shirt and dressing LB. See caretool for performance. Pt sat upright in bed supported to don shirt and required total A to come forward in the bed to pull shirt and sweatshirt over trunk. Pt was in the beginning of having  a BM- Nurse came in to assist and came to assist pt with completing bowel program.  ? ?Pt required mod to max A to roll in the bed with total A for management of LEs. Pt was disoriented to time and events of the day today; assist to reorient. Offered for the patient to get out of bed and into TIS w/c since his departure is not until 1230ish- but declined.  ? ?Pt left sitting up in bed finishing drinking coffee. ? ? ? ?Therapy Documentation ?Precautions:  ?Precautions ?Precautions: Fall ?Precaution Comments: paraplegia ?Restrictions ?Weight Bearing Restrictions: No ? ?  ?Pain: ?Pain Assessment ?Pain Scale: 0-10 ?Pain Score: 0-No pain ? ? ? ?Therapy/Group: Individual Therapy ? ?Nicoletta Ba ?02/20/2022, 9:16 AM ?

## 2022-02-23 ENCOUNTER — Other Ambulatory Visit: Payer: 59

## 2022-02-23 ENCOUNTER — Encounter: Payer: Self-pay | Admitting: Internal Medicine

## 2022-02-23 ENCOUNTER — Telehealth: Payer: Self-pay

## 2022-02-23 DIAGNOSIS — G9520 Unspecified cord compression: Secondary | ICD-10-CM

## 2022-02-23 DIAGNOSIS — K592 Neurogenic bowel, not elsewhere classified: Secondary | ICD-10-CM

## 2022-02-23 DIAGNOSIS — N319 Neuromuscular dysfunction of bladder, unspecified: Secondary | ICD-10-CM

## 2022-02-23 DIAGNOSIS — C7951 Secondary malignant neoplasm of bone: Secondary | ICD-10-CM

## 2022-02-23 NOTE — Telephone Encounter (Signed)
Prior Authorization has been submitted for Lidocaine  2% Jelly  ?

## 2022-02-24 NOTE — Telephone Encounter (Signed)
Lidocaine 5% ointment / jelly denied. It can be resubmitted with letter of necessity. Please advise.  ?

## 2022-02-25 ENCOUNTER — Ambulatory Visit: Payer: 59 | Admitting: Internal Medicine

## 2022-02-25 NOTE — Telephone Encounter (Signed)
?  Message left for [name]  (wife) [phone] to call back about the denial. ? ?

## 2022-03-11 ENCOUNTER — Encounter: Payer: Self-pay | Admitting: Internal Medicine

## 2022-03-16 ENCOUNTER — Other Ambulatory Visit: Payer: Self-pay | Admitting: Physical Medicine and Rehabilitation

## 2022-03-18 ENCOUNTER — Ambulatory Visit: Payer: 59 | Admitting: Internal Medicine

## 2022-03-24 ENCOUNTER — Other Ambulatory Visit: Payer: Self-pay | Admitting: Internal Medicine

## 2022-03-24 ENCOUNTER — Telehealth: Payer: Self-pay

## 2022-03-24 NOTE — Telephone Encounter (Signed)
This LPN was notified by Lonia Chimera that this pt Richard Santos) has expired on 13-Apr-2022 at 4:17pm. MD's notified and HIM.  ?

## 2022-04-21 ENCOUNTER — Ambulatory Visit: Payer: 59 | Admitting: Internal Medicine

## 2022-04-23 DEATH — deceased

## 2022-05-07 ENCOUNTER — Encounter: Payer: 59 | Admitting: Internal Medicine

## 2022-05-12 ENCOUNTER — Ambulatory Visit: Payer: 59 | Admitting: Internal Medicine

## 2022-10-22 IMAGING — MR MR HEAD WO/W CM
12 series · 48 of 48 positions shown · IV contrast (multihance)
Comparison: 09/05/2021

CLINICAL DATA: Brain/CNS neoplasm, monitor

EXAM:
MRI HEAD WITHOUT AND WITH CONTRAST
TECHNIQUE: Multiplanar, multiecho pulse sequences of the brain and surrounding
structures were obtained without and with intravenous contrast.
CONTRAST:  13mL MULTIHANCE GADOBENATE DIMEGLUMINE 529 MG/ML IV SOLN

[Series 2: FLAIR · sagittal · 3.0mm · 0.75mm/px · 3 of 39 slices shown (1 of 2)]
[im 1/39]
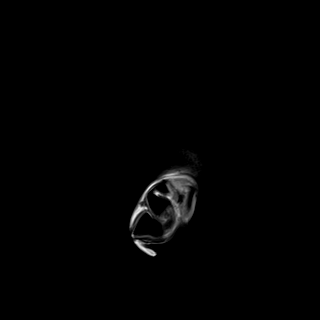
[im 20/39]
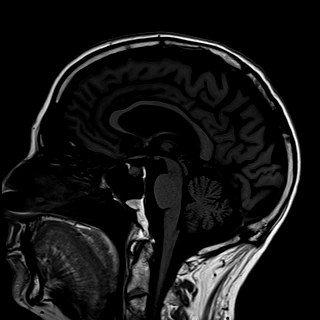
[im 39/39]
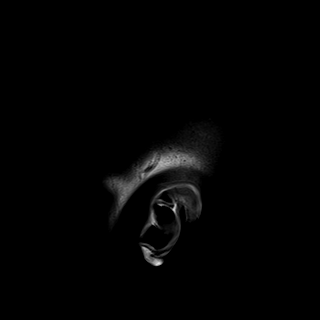

[Series 3: DWI · axial · 3.0mm · 1.50mm/px · z∈[-30,+117]mm · 4 of 82 slices shown (1 of 2)]
[im 1/82]
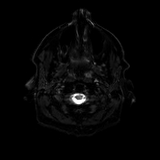
[im 28/82]
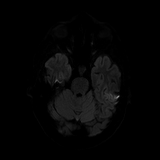
[im 55/82]
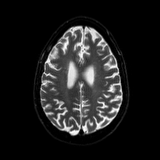
[im 82/82]
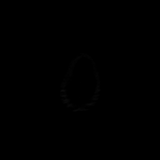

[Series 4: DWI · axial · 3.0mm · 1.50mm/px · z∈[-30,+117]mm · 2 of 41 slices shown (2 of 2)]
[im 1/41]
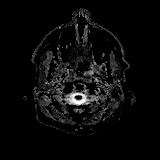
[im 41/41]
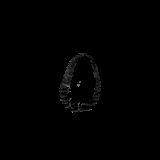

[Series 5: T2 · axial · 5.0mm · 0.57mm/px · 1 of 30 slices shown (1 of 2)]
[im 1/30]
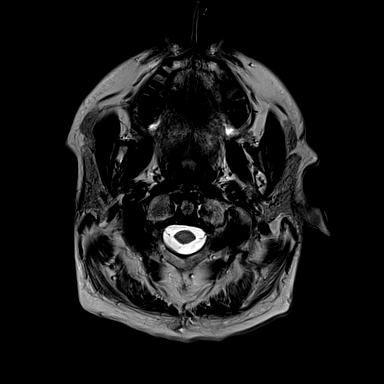

[Series 6: swi_images · axial · 1.5mm · 0.90mm/px · z∈[-31,+115]mm · 5 of 104 slices shown]
[im 1/104]
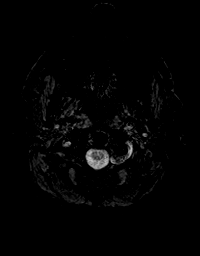
[im 26/104]
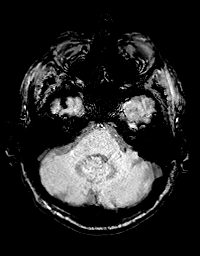
[im 52/104]
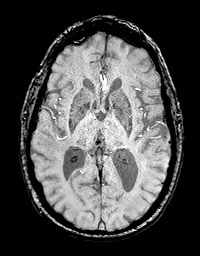
[im 78/104]
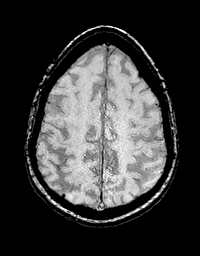
[im 104/104]
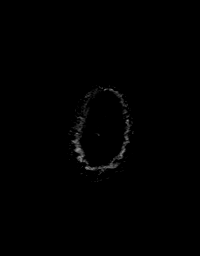

[Series 8: FLAIR · axial · 3.0mm · 0.86mm/px · z∈[-98,+91]mm · 3 of 64 slices shown (2 of 2)]
[im 1/64]
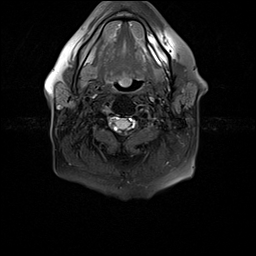
[im 32/64]
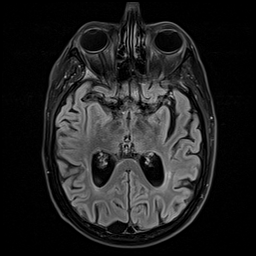
[im 64/64]
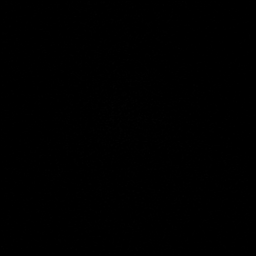

[Series 9: T2 · axial · non-contrast · 1.0mm · 0.86mm/px · z∈[-80,+92]mm · 8 of 176 slices shown (2 of 2)]
[im 1/176]
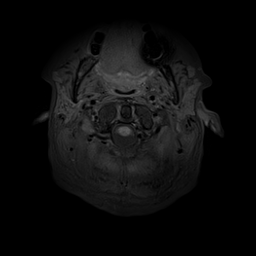
[im 26/176]
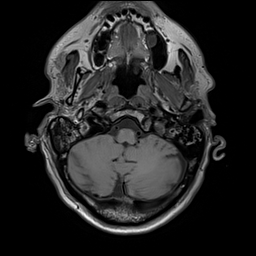
[im 51/176]
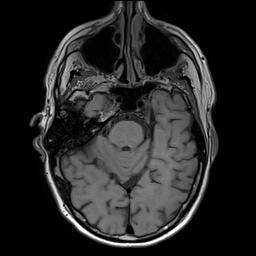
[im 76/176]
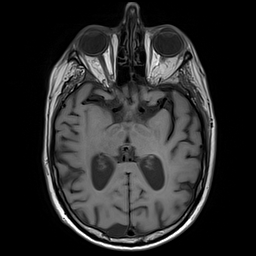
[im 101/176]
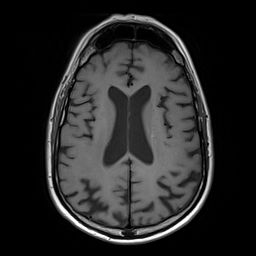
[im 126/176]
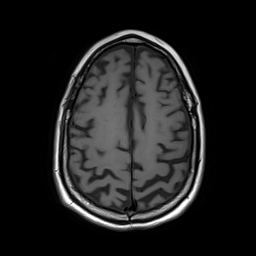
[im 151/176]
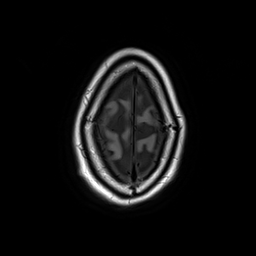
[im 176/176]
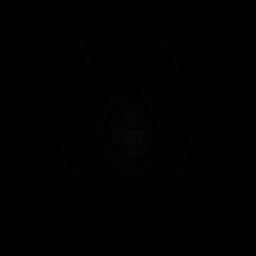

[Series 10: T2 post-contrast · coronal · 3.0mm · 0.57mm/px · 2 of 50 slices shown (1 of 2)]
[im 1/50]
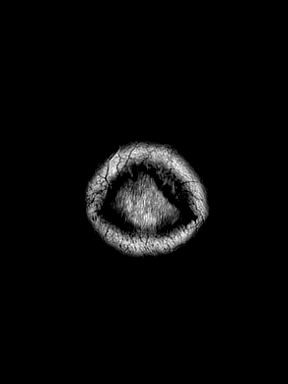
[im 50/50]
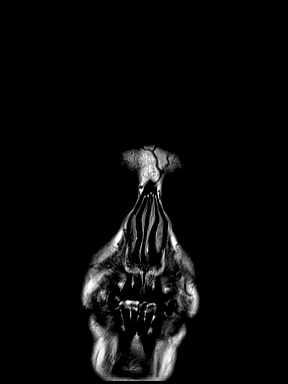

[Series 11: T2 post-contrast · axial · 1.0mm · 0.86mm/px · z∈[-80,+92]mm · 8 of 176 slices shown (2 of 2)]
[im 1/176]
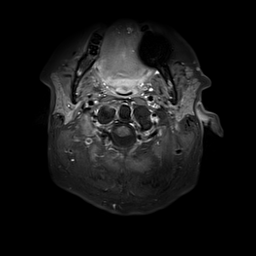
[im 26/176]
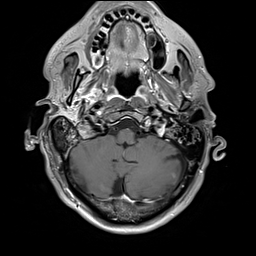
[im 51/176]
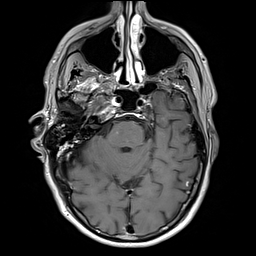
[im 76/176]
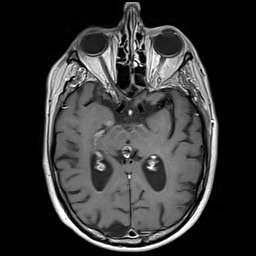
[im 101/176]
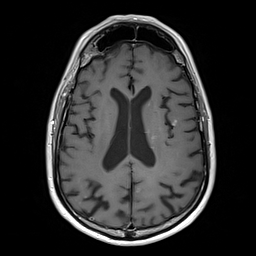
[im 126/176]
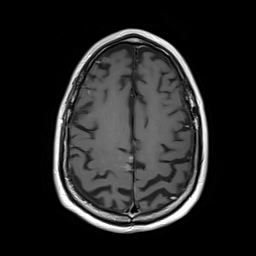
[im 151/176]
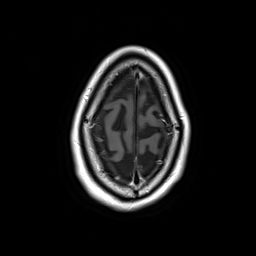
[im 176/176]
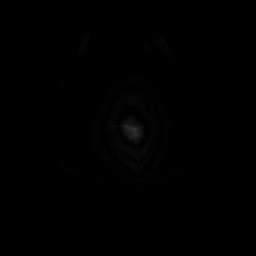

[Series 12: T1 post-contrast · axial · 1.0mm · 0.75mm/px · z∈[-73,+86]mm · 8 of 160 slices shown (1 of 2)]
[im 1/160]
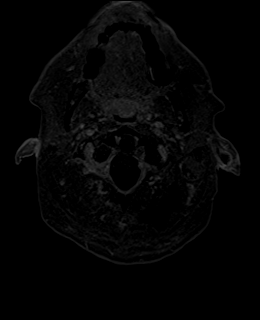
[im 23/160]
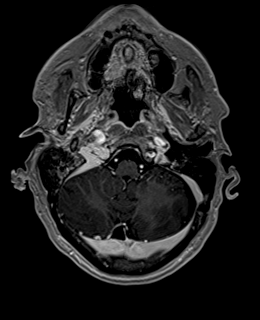
[im 46/160]
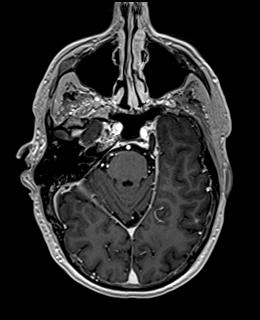
[im 69/160]
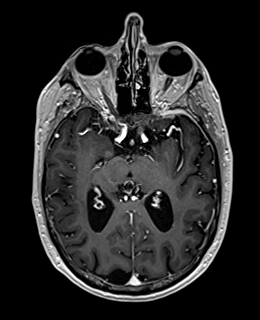
[im 91/160]
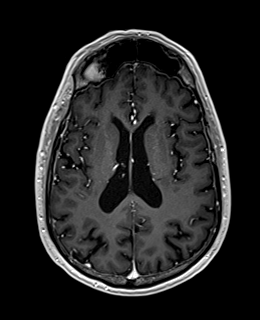
[im 114/160]
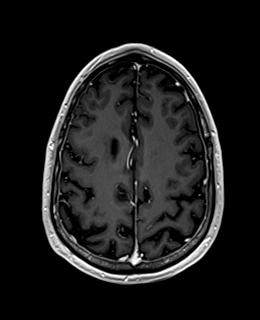
[im 137/160]
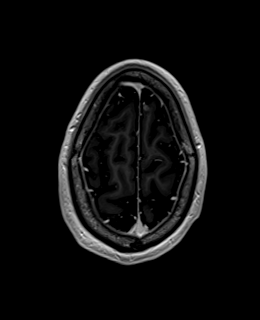
[im 160/160]
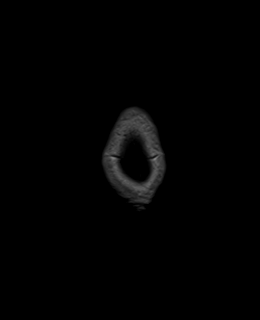

[Series 13: T1 post-contrast · coronal · 3.0mm · 0.57mm/px · 2 of 50 slices shown (2 of 2)]
[im 1/50]
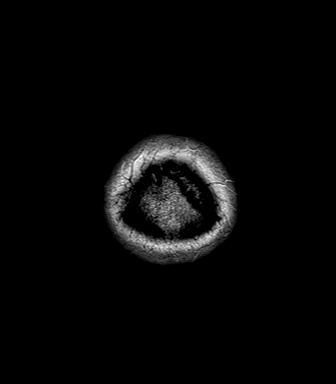
[im 50/50]
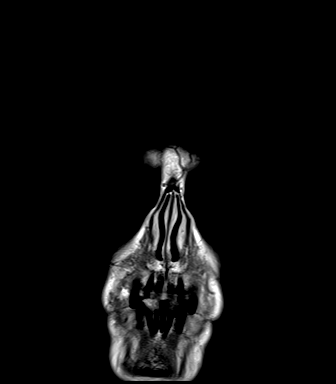

[Series 14: FLAIR post-contrast · sagittal · 3.0mm · 0.75mm/px · 2 of 39 slices shown]
[im 1/39]
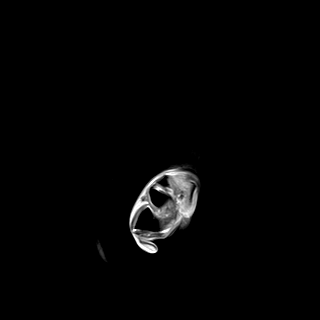
[im 39/39]
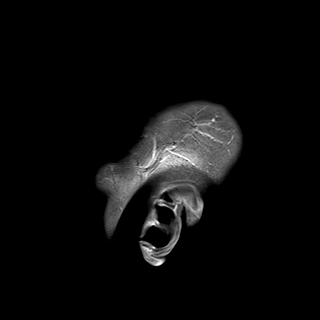

[48 of 48 positions shown; findings below may reference images not displayed]

FINDINGS: Brain: There are multiple new small enhancing lesions on series 11
with some examples as follows:

Right temporal lobe images 65, 70, 76; left temporal on image 60;
right frontal lobe (images 125 and 129); left frontal lobe images
117, 123, 142); left parietal lobe image 110); left parietotemporal
on image 84; left occipital on image 58; right occipital on image
53; white matter tracts about the lentiform nuclei bilaterally;
posterior left thalamus on image 83; cerebellum and pons (images 27,
46, 49).

Small foci of cortical diffusion hyperintensity are present in the
frontoparietal lobes bilaterally. A few of these do correspond to
areas of enhancement above.

Stable curvilinear enhancement of left occipitotemporal lesion
(series 11, image 51). Stable punctate right external capsule lesion
(image 95).

Scattered small foci of T2 hyperintensity in the supratentorial
white matter may reflect superimposed chronic microvascular ischemic
changes. Foci of susceptibility again identified at the site of
treated left inferior frontal and cerebellar lesions. There is no
mass effect. Ventricles and sulci are stable in size and
configuration.

Vascular: Major vessel flow voids at the skull base are preserved.

Skull and upper cervical spine: Normal marrow signal is preserved.

Sinuses/Orbits: Paranasal sinuses are aerated. Orbits are
unremarkable.

Other: Sella is unremarkable. Persistent patchy bilateral mastoid
effusions.
IMPRESSION: Numerous new small enhancing lesions. There is no substantial edema
but these are small in size and could reflect metastatic lesions.
Given that some of these are cortical and demonstrate corresponding
reduced diffusion, at least a subset may reflect subacute infarcts.

Previously treated lesions are stable.
